# Patient Record
Sex: Male | Born: 1955 | Race: White | Hispanic: No | Marital: Married | Smoking: Never smoker
Health system: Southern US, Community
[De-identification: ages and names within clinical notes are randomized; demographics above are authoritative.]

## PROBLEM LIST (undated history)

## (undated) DIAGNOSIS — Z8673 Personal history of transient ischemic attack (TIA), and cerebral infarction without residual deficits: Secondary | ICD-10-CM

## (undated) DIAGNOSIS — N186 End stage renal disease: Secondary | ICD-10-CM

## (undated) DIAGNOSIS — F419 Anxiety disorder, unspecified: Secondary | ICD-10-CM

## (undated) DIAGNOSIS — I739 Peripheral vascular disease, unspecified: Secondary | ICD-10-CM

## (undated) DIAGNOSIS — C641 Malignant neoplasm of right kidney, except renal pelvis: Secondary | ICD-10-CM

## (undated) DIAGNOSIS — D638 Anemia in other chronic diseases classified elsewhere: Secondary | ICD-10-CM

## (undated) DIAGNOSIS — I10 Essential (primary) hypertension: Secondary | ICD-10-CM

## (undated) DIAGNOSIS — Z8711 Personal history of peptic ulcer disease: Secondary | ICD-10-CM

## (undated) DIAGNOSIS — I2510 Atherosclerotic heart disease of native coronary artery without angina pectoris: Secondary | ICD-10-CM

## (undated) DIAGNOSIS — N289 Disorder of kidney and ureter, unspecified: Secondary | ICD-10-CM

## (undated) DIAGNOSIS — E119 Type 2 diabetes mellitus without complications: Secondary | ICD-10-CM

## (undated) DIAGNOSIS — F32A Depression, unspecified: Secondary | ICD-10-CM

## (undated) HISTORY — PX: EYE SURGERY: SHX253

## (undated) HISTORY — PX: PENILE PROSTHESIS IMPLANT: SHX240

---

## 2005-03-30 ENCOUNTER — Encounter (INDEPENDENT_AMBULATORY_CARE_PROVIDER_SITE_OTHER): Payer: Self-pay | Admitting: *Deleted

## 2005-03-30 ENCOUNTER — Observation Stay (HOSPITAL_COMMUNITY): Admission: RE | Admit: 2005-03-30 | Discharge: 2005-03-31 | Payer: Self-pay | Admitting: Urology

## 2007-04-30 ENCOUNTER — Encounter: Admission: RE | Admit: 2007-04-30 | Discharge: 2007-04-30 | Payer: Self-pay | Admitting: Specialist

## 2009-10-29 ENCOUNTER — Inpatient Hospital Stay (HOSPITAL_COMMUNITY): Admission: AD | Admit: 2009-10-29 | Discharge: 2009-11-01 | Payer: Self-pay | Admitting: Surgery

## 2009-11-29 ENCOUNTER — Ambulatory Visit (HOSPITAL_COMMUNITY): Admission: RE | Admit: 2009-11-29 | Discharge: 2009-11-30 | Payer: Self-pay | Admitting: Ophthalmology

## 2011-01-22 LAB — CBC
HCT: 31.4 % — ABNORMAL LOW (ref 39.0–52.0)
Hemoglobin: 11.1 g/dL — ABNORMAL LOW (ref 13.0–17.0)
MCHC: 35.5 g/dL (ref 30.0–36.0)
MCV: 89.7 fL (ref 78.0–100.0)
RBC: 3.5 MIL/uL — ABNORMAL LOW (ref 4.22–5.81)

## 2011-01-22 LAB — GLUCOSE, CAPILLARY
Glucose-Capillary: 113 mg/dL — ABNORMAL HIGH (ref 70–99)
Glucose-Capillary: 206 mg/dL — ABNORMAL HIGH (ref 70–99)

## 2011-01-22 LAB — BASIC METABOLIC PANEL
CO2: 24 mEq/L (ref 19–32)
Chloride: 106 mEq/L (ref 96–112)
Creatinine, Ser: 1.79 mg/dL — ABNORMAL HIGH (ref 0.4–1.5)
GFR calc Af Amer: 48 mL/min — ABNORMAL LOW (ref >60)
Sodium: 136 mEq/L (ref 135–145)

## 2011-01-22 LAB — POCT I-STAT 4, (NA,K, GLUC, HGB,HCT): Hemoglobin: 11.2 g/dL — ABNORMAL LOW (ref 13.0–17.0)

## 2011-01-30 ENCOUNTER — Ambulatory Visit (HOSPITAL_COMMUNITY): Payer: BC Managed Care – PPO

## 2011-01-30 ENCOUNTER — Ambulatory Visit (HOSPITAL_COMMUNITY)
Admission: RE | Admit: 2011-01-30 | Discharge: 2011-01-30 | Disposition: A | Discharge status: Home / Self Care | Payer: BC Managed Care – PPO | Source: Ambulatory Visit | Attending: Orthopaedic Surgery | Admitting: Orthopaedic Surgery

## 2011-01-30 ENCOUNTER — Other Ambulatory Visit: Payer: Self-pay | Admitting: Orthopaedic Surgery

## 2011-01-30 DIAGNOSIS — L97509 Non-pressure chronic ulcer of other part of unspecified foot with unspecified severity: Secondary | ICD-10-CM | POA: Insufficient documentation

## 2011-01-30 DIAGNOSIS — E1169 Type 2 diabetes mellitus with other specified complication: Secondary | ICD-10-CM | POA: Insufficient documentation

## 2011-01-30 DIAGNOSIS — Z01818 Encounter for other preprocedural examination: Secondary | ICD-10-CM | POA: Insufficient documentation

## 2011-01-30 DIAGNOSIS — Z8673 Personal history of transient ischemic attack (TIA), and cerebral infarction without residual deficits: Secondary | ICD-10-CM | POA: Insufficient documentation

## 2011-01-30 DIAGNOSIS — Z01812 Encounter for preprocedural laboratory examination: Secondary | ICD-10-CM | POA: Insufficient documentation

## 2011-01-30 DIAGNOSIS — I10 Essential (primary) hypertension: Secondary | ICD-10-CM | POA: Insufficient documentation

## 2011-01-30 DIAGNOSIS — M86179 Other acute osteomyelitis, unspecified ankle and foot: Secondary | ICD-10-CM | POA: Insufficient documentation

## 2011-01-30 DIAGNOSIS — M9080 Osteopathy in diseases classified elsewhere, unspecified site: Secondary | ICD-10-CM | POA: Insufficient documentation

## 2011-01-30 DIAGNOSIS — Z01810 Encounter for preprocedural cardiovascular examination: Secondary | ICD-10-CM | POA: Insufficient documentation

## 2011-01-30 LAB — CBC
Hemoglobin: 10.9 g/dL — ABNORMAL LOW (ref 13.0–17.0)
MCH: 28.4 pg (ref 26.0–34.0)
MCV: 82 fL (ref 78.0–100.0)
RBC: 3.84 MIL/uL — ABNORMAL LOW (ref 4.22–5.81)
WBC: 11.9 10*3/uL — ABNORMAL HIGH (ref 4.0–10.5)

## 2011-01-30 LAB — BASIC METABOLIC PANEL
BUN: 38 mg/dL — ABNORMAL HIGH (ref 6–23)
CO2: 22 mEq/L (ref 19–32)
Chloride: 106 mEq/L (ref 96–112)
Creatinine, Ser: 2.44 mg/dL — ABNORMAL HIGH (ref 0.4–1.5)
GFR calc Af Amer: 34 mL/min — ABNORMAL LOW (ref >60)
Potassium: 5 mEq/L (ref 3.5–5.1)

## 2011-01-30 LAB — SURGICAL PCR SCREEN: MRSA, PCR: NEGATIVE

## 2011-01-30 LAB — GLUCOSE, CAPILLARY: Glucose-Capillary: 140 mg/dL — ABNORMAL HIGH (ref 70–99)

## 2011-02-03 NOTE — H&P (Signed)
  [name], [name] NO.:  [id number]  MEDICAL RECORD NO.:  [id number]           PATIENT TYPE:  O  LOCATION:  SDSC                         FACILITY:  [facility]  PHYSICIAN:  Lind Guest. Ninfa Linden, M.D.DATE OF BIRTH:  07-03-1956  DATE OF ADMISSION:  01/30/2011 DATE OF DISCHARGE:  01/30/2011                             HISTORY & PHYSICAL   CHIEF COMPLAINT:  Chronic nonhealing wound, right foot fifth toe.  HISTORY OF PRESENT ILLNESS:  Mr. Nealy is a very pleasant 55 year old gentleman who is my brother's father-in-law.  He is a longtime diabetic and has severe peripheral neuropathy.  He developed a blister on is right fifth toe back in January.  This has slowly worsened with time in spite of antibiotics and debridements.  I saw him in the office yesterday and assessed the wound.  There was some gross purulence that I could express from this.  The x-rays in my office showed complete lossof the DIP joint suggesting osteomyelitis as well.  I recommended, he undergo a fifth toe amputation.  X-rays also showed several calcification of vessels in his foot.  He does wish to proceed with surgery given the chronic nature of this wound and the site is not healing as well as the fact that he is a diabetic.  He has had swelling intermittently in his foot as well.  PAST MEDICAL HISTORY:  Diabetes, high blood pressure, kidney disease, and anxiety.  CURRENT MEDICATIONS:  Lantus insulin, lisinopril, Cipro, and Onglyza.  ALLERGIES:  No known drug allergies.  FAMILY MEDICAL HISTORY:  Significant for diabetes and high blood pressure.  SOCIAL HISTORY:  He is married.  He works as a Freight forwarder.  He is on the seat quite a bit.  He does not smoke and does drink on occasion.  REVIEW OF SYSTEMS:  He denies any chest pain, shortness of breath, fever, chills, nausea, and vomiting.  PHYSICAL EXAMINATION:  VITAL SIGNS:  He is afebrile with stable vital signs. GENERAL:  He is alert and  oriented x3, in no acute distress or obvious discomfort. HEENT:  Normocephalic, atraumatic.  Pupils equal, round, and reactive to light.  Extraocular muscles intact. NECK:  Supple. LUNGS:  Clear to auscultation bilaterally. HEART:  Regular rate and rhythm. ABDOMEN:  Benign. EXTREMITIES:  Right foot shows swelling with the wound on the dorsolateral aspect of the fifth toe.  There is purulent drainage from this that is associated with edema as well.  X-rays confirm suspicion of septic arthritis and osteomyelitis of the fifth toe on the right foot.  ASSESSMENT:  This is a 55 year old diabetic with significant infection involving his fifth toe on his right foot.  PLAN:  We will proceed with an amputation of the toe today.  He understands the risks and benefits of this and does wish to proceed with surgery.     Lind Guest. Ninfa Linden, M.D.     CYB/MEDQ  D:  01/30/2011  T:  01/31/2011  Job:  [id number]  Electronically Signed by Jean Rosenthal M.D. on 02/03/2011 04:08:29 PM

## 2011-02-03 NOTE — Op Note (Signed)
  Maxwell Harmon, Maxwell Harmon NO.:  [id number]  MEDICAL RECORD NO.:  [id number]           PATIENT TYPE:  O  LOCATION:  SDSC                         FACILITY:  [facility]  PHYSICIAN:  Lind Guest. 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  PROCEDURE:  Right fifth toe amputation down to the MTP joint.  SURGEON:  Lind Guest. Ninfa Linden, MD  ANESTHESIA: 1. Regional right ankle block. 2. Mask ventilation IV sedation.  ESTIMATED BLOOD LOSS:  Minimal.  COMPLICATIONS:  None.  INDICATIONS:  [name] is a 55 year old diabetic with the wound that he developed on his right fifth toe around January.  He has severe peripheral neuropathy.  The wound persisted in spite of local debridement and antibiotics became deep infection.  This caused some swelling of his foot.  X-rays were obtained.  This showed complete destruction of the DIP joint and obvious osteomyelitis.  I recommended he undergo a fifth toe amputation.  The risks and benefits were explained to him in detail and he did wish to proceed with surgery.  PROCEDURE DESCRIPTION:  After informed consent was obtained, appropriate right foot was marked.  Anesthesia obtained with an ankle block.  He was then brought to the operating room, placed supine on the operating table.  A bump was placed under his hip.  A right foot and ankle were prepped and draped with DuraPrep and sterile drapes.  A time-out was called to identify the correct patient and the correct right foot.  I then used a towel around the ankle and an Esmarch as a local tourniquet. I was able to excise around the  fifth toe with #10 blade and removed the fifth toe to level the MTP joint.  I did take a look at the toe on the back table and even the proximal phalanx had necrotic bone.  The metatarsal head of the fifth ray was intact and that bone did not appears soft at all.  I removed other necrotic tissue, and let down the ankle tourniquet, there was actually good bleeding tissue in this area. I then copiously irrigated the wound.  I reapproximated the skin flap with interrupted 2-0 nylon suture.  Xeroform followed well-passed sterile dressing is applied, and the patient was taken to the recovery room in stable condition. Postoperatively, I will allow him to weight bear as tolerated in a postoperative shoe, but he should try to stay off as much as possible to elevation.  A followup appointment should be established in the office.     Lind Guest. Ninfa Linden, M.D.     CYB/MEDQ  D:  01/30/2011  T:  01/31/2011  Job:  [id number]  Electronically Signed by Jean Rosenthal M.D. on 02/03/2011 04:08:31 PM

## 2011-02-05 LAB — GLUCOSE, CAPILLARY
Glucose-Capillary: 134 mg/dL — ABNORMAL HIGH (ref 70–99)
Glucose-Capillary: 147 mg/dL — ABNORMAL HIGH (ref 70–99)
Glucose-Capillary: 176 mg/dL — ABNORMAL HIGH (ref 70–99)
Glucose-Capillary: 177 mg/dL — ABNORMAL HIGH (ref 70–99)
Glucose-Capillary: 180 mg/dL — ABNORMAL HIGH (ref 70–99)
Glucose-Capillary: 182 mg/dL — ABNORMAL HIGH (ref 70–99)
Glucose-Capillary: 186 mg/dL — ABNORMAL HIGH (ref 70–99)
Glucose-Capillary: 199 mg/dL — ABNORMAL HIGH (ref 70–99)
Glucose-Capillary: 217 mg/dL — ABNORMAL HIGH (ref 70–99)
Glucose-Capillary: 228 mg/dL — ABNORMAL HIGH (ref 70–99)

## 2011-02-05 LAB — PROTIME-INR
INR: 1.19 (ref 0.00–1.49)
Prothrombin Time: 15 seconds (ref 11.6–15.2)

## 2011-02-05 LAB — CBC
HCT: 25.3 % — ABNORMAL LOW (ref 39.0–52.0)
Hemoglobin: 7.2 g/dL — ABNORMAL LOW (ref 13.0–17.0)
Hemoglobin: 9.3 g/dL — ABNORMAL LOW (ref 13.0–17.0)
MCHC: 34.2 g/dL (ref 30.0–36.0)
MCHC: 34.6 g/dL (ref 30.0–36.0)
MCV: 89 fL (ref 78.0–100.0)
MCV: 89.7 fL (ref 78.0–100.0)
MCV: 89.7 fL (ref 78.0–100.0)
Platelets: 144 10*3/uL — ABNORMAL LOW (ref 150–400)
Platelets: 145 10*3/uL — ABNORMAL LOW (ref 150–400)
Platelets: 155 10*3/uL (ref 150–400)
Platelets: 161 10*3/uL (ref 150–400)
RBC: 2.38 MIL/uL — ABNORMAL LOW (ref 4.22–5.81)
RBC: 2.82 MIL/uL — ABNORMAL LOW (ref 4.22–5.81)
RDW: 13.2 % (ref 11.5–15.5)
RDW: 13.5 % (ref 11.5–15.5)
WBC: 6.8 10*3/uL (ref 4.0–10.5)
WBC: 6.8 10*3/uL (ref 4.0–10.5)
WBC: 7.7 10*3/uL (ref 4.0–10.5)

## 2011-02-05 LAB — CROSSMATCH: ABO/RH(D): O NEG

## 2011-02-05 LAB — COMPREHENSIVE METABOLIC PANEL
CO2: 18 mEq/L — ABNORMAL LOW (ref 19–32)
Calcium: 8.5 mg/dL (ref 8.4–10.5)
Creatinine, Ser: 1.9 mg/dL — ABNORMAL HIGH (ref 0.4–1.5)
GFR calc Af Amer: 45 mL/min — ABNORMAL LOW (ref >60)
GFR calc non Af Amer: 37 mL/min — ABNORMAL LOW (ref >60)
Glucose, Bld: 170 mg/dL — ABNORMAL HIGH (ref 70–99)

## 2011-02-05 LAB — BASIC METABOLIC PANEL
BUN: 25 mg/dL — ABNORMAL HIGH (ref 6–23)
Calcium: 8.5 mg/dL (ref 8.4–10.5)
GFR calc non Af Amer: 40 mL/min — ABNORMAL LOW (ref >60)
Glucose, Bld: 205 mg/dL — ABNORMAL HIGH (ref 70–99)

## 2011-02-05 LAB — APTT: aPTT: 28 seconds (ref 24–37)

## 2011-02-05 LAB — ABO/RH: ABO/RH(D): O NEG

## 2011-03-23 NOTE — Op Note (Signed)
NAMEKIRILL, BETIT               ACCOUNT NO.:  [id number]   MEDICAL RECORD NO.:  [id number]          PATIENT TYPE:  AMB   LOCATION:  DAY                          FACILITY:  [facility]   PHYSICIAN:  Domingo Pulse, M.D.  DATE OF BIRTH:  05/03/1956   DATE OF PROCEDURE:  03/30/2005  DATE OF DISCHARGE:                                 OPERATIVE REPORT   PREOPERATIVE DIAGNOSES:  1.  Malfunctioning penile prosthesis.  2.  Undesired fertility.   POSTOPERATIVE DIAGNOSES:  1.  Malfunctioning penile prosthesis.  2.  Undesired fertility.   PROCEDURES:  1.  Removal of two-piece AMs penile prosthesis.  2.  Placement of three-piece AMs penile prosthesis.  3.  Bilateral vasectomy.   SURGEON:  Domingo Pulse, M.D.   ASSISTANT:  None.   ANESTHESIA:  General.   SPECIMEN:  Two vasa.   COMPLICATIONS:  None.   DRAINS:  A  Foley catheter and a J-P drain.   HISTORY:  This 55 year old male had a penile prosthesis placed approximately  nine years ago.  This was done in [place], [place], for the treatment  of severe erectile dysfunction caused by diabetes.  The patient had had good  results from this for a number of years but recently has developed a leak.  The device is no longer functional.  He would like to have a new penile  prosthesis placed.  He would be interested in having a larger erection and a  longer erection if possible.  His evaluation demonstrated that the device  was a little short, and it was thought that he could perhaps tolerate a  longer prosthesis. For that reason, we anticipate that he will be sized up.  After looking at the options, he has elected to go with a three-piece device  which should give a better quality erection.  He understands the risks and  benefits of the procedure including the potential for infection given the  fact that he is diabetic, and this is a revision.  Full informed consent was  obtained.   PROCEDURE:  After successful induction of general  anesthesia, the patient  was placed in the supine position.  He was given a full 10-minute scrub and  paint with Betadine.  He was then draped including a Vi-Drape to protect the  operative field.  The patient had had this operation done through a vertical  penoscrotal incision, and that was re-utilized.  Once the urethra had been  exposed, the Wilson ring was placed in the usual fashion giving good  exposure to the corporal bodies.  Dissection proceeded posteriorly, and it  became clear that the tubing had exited the corpora at a very, very  dependant position.  It was felt this was not optimal for good placement of  the device and for closure.  For that reason, a decision was made to cut the  tubing to the penile tube directly adjacent to the corporal body and then  pull out the device through a separate corporotomy in a slightly more distal  position. The leak in the tubing was found to be bilateral  and directly  adjacent to the pump where the device has broken down.  The pump was  dissected away from the tissue and was mobilized.  The tubing down to the  penile tubes was freed up all the way down to the corporal bodies.  Both  tubes were then cut flush with the corporal body.  Separate corporotomies  were made, and the entire device was removed including the rear-tip  extenders.  The [employer] company was contacted to determine the exact length of  the prosthesis that had been in place, and it was an 18 cm with two 0.5-cm  rear-tip giving a total length of 19.  The entire device was successfully  removed.  The patient was once again redilated and was easy to dilate up to  #14 both proximally and distally.  The patient was checked to make sure  there was no perforation and no crossover, and it appeared that the dilation  was appropriate.  Stay sutures were placed in the corporotomy.  The area was  sized, and it appeared that he would take approximately a 21-cm device,  possibly 21.5,  indicating significant improvement over what had been  available previously.  The 18-cm device was selected with the plan to use  several rear-tip extenders.  The reservoir space was then prepared.  The  ring was identified, and the posterior aspect of the ring was opened  allowing for placement of the reservoir into the retroperitoneal space.  80  cm was placed within the device.  There still was no back pressure.  At the  end of the procedure, this was pulled back to 60 cm.  The penile tubes were  then placed in the usual fashion with the Furlough tool.  At first, the 21  cm was selected.  It became clear that he could tolerate a little bit more,  and an additional 1-cm rear-tip was placed for a total length of 22 cm.  This gave better filling out to the head of the penis, but there was no  buckling inside the corporotomy, and it appears that this was very nicely  seated.  Prior to placement of the device, the patient had what has been  referred to as a mini-salvage.  The area was irrigated with antibiotic  solution, half-strength Betadine, half-strength peroxide, followed by  another round of antibiotic solution.  This has been described by several  experts in the field including Dr. Romie Minus at [facility] as well as  by Dr. Rogelia Boga who recommend that on revisions this be utilized to cut  down on any biofilm that may have accumulated.  The irrigation was performed  both within the current operative field as well as inside the two  corporotomies.  When the device was placed, the area was cycled, and it did  appear that it was very nicely situated.  The corporotomy was then closed  with the preplaced sutures.  The input tubing protective sheath was cut back  to the level of the exit.  An additional space was made for the pump which  was placed into a dependent position.  The connection to the reservoir was made after ensuring that the total reservoir volume was only 60 cc.   The  patient wanted to have a vasectomy.  The vasa were identified on each side.  They was dissected away from the surrounding tissue with scissors. The vasa  were cut, cauterized, ligated, and separated.  The patient had a small drain  placed, and this was brought out through a separate exit wound.  He was then  closed in several layers with 2-0 Vicryl, followed by 3-0 Vicryl in the  skin.  A Tegaderm was placed.  A pressure dressing was applied.  The Foley  catheter was left in place.  The patient tolerated the procedure well and  was taken to the recovery room in good condition.     _______________    [id number]  D:  03/30/2005  T:  03/30/2005  Job:  [id number]

## 2011-11-06 HISTORY — PX: TOE AMPUTATION: SHX809

## 2013-01-26 ENCOUNTER — Other Ambulatory Visit (INDEPENDENT_AMBULATORY_CARE_PROVIDER_SITE_OTHER): Payer: Self-pay | Admitting: Surgery

## 2013-01-26 NOTE — H&P (Signed)
Maxwell Harmon, Maxwell Harmon NO.:  [id number]  MEDICAL RECORD NO.:  [id number]  LOCATION:                                 FACILITY:  PHYSICIAN:  Coralie Keens, M.D. DATE OF BIRTH:  1956/01/10  DATE OF ADMISSION:  01/29/2013 DATE OF DISCHARGE:  01/29/2013                             HISTORY & PHYSICAL   CHIEF COMPLAINT:  Right upper quadrant abdominal pain, epigastric pain, and nausea.  HISTORY:  This is a 57 year old gentleman who has had a several-month history of epigastric abdominal pain, right upper quadrant abdominal pain, and bloating after fatty meals.  He also has nausea, but no emesis.  This has been intermittent.  He will also have loose bowel movements with this.  The pain is mild-to-moderate in intensity.  He currently has no other complaints.  PAST MEDICAL HISTORY:  Diabetes, hypertension, chronic renal failure, upper GI bleed.  MEDICATIONS:  Insulin, lisinopril.  ALLERGIES:  No known drug allergies.  PAST SURGICAL HISTORY:  Amputation of the right 5th toe.  SOCIAL HISTORY:  He does not smoke.  He drinks alcohol rarely.  FAMILY HISTORY:  Significant for diabetes and hypertension.  REVIEW OF SYSTEMS:  Negative for chest pain, fever, shortness of breath.  PHYSICAL EXAMINATION:  GENERAL:  He is a well-developed, well-nourished gentleman in no acute distress. VITAL SIGNS:  He is afebrile.  Vital signs stable. LUNGS:  Clear to auscultation bilaterally.  Normal respiratory effort. CARDIOVASCULAR:  Regular rate and rhythm.  There are no murmurs.  There is no peripheral edema. ABDOMEN:  Soft.  There is minimal tenderness in the epigastrium and right upper quadrant.  There are no hernias. EXTREMITIES:  Warm with good cap refill.  DATA REVIEWED:  The patient has an ultrasound of the abdomen showing thick sludge and small gallstones.  There is no gallbladder wall thickening.  The bile duct is normal.  IMPRESSION:  Symptomatic  cholelithiasis.  PLAN:  We will proceed with laparoscopic cholecystectomy and possible cholangiogram.  I discussed risks of surgery with him.  These include, but are not limited to bleeding, infection, bile duct injury, bile leak, need to convert to an open procedure, the chance this may not resolve all his symptoms, etc.  He understands and wishes to proceed. Likelihood of success is good.     Coralie Keens, M.D.     DB/MEDQ  D:  01/26/2013  T:  01/26/2013  Job:  [id number]

## 2013-02-03 HISTORY — PX: CHOLECYSTECTOMY: SHX55

## 2013-02-06 ENCOUNTER — Other Ambulatory Visit (INDEPENDENT_AMBULATORY_CARE_PROVIDER_SITE_OTHER): Payer: Self-pay | Admitting: Surgery

## 2013-02-06 DIAGNOSIS — K8010 Calculus of gallbladder with chronic cholecystitis without obstruction: Secondary | ICD-10-CM

## 2013-02-09 ENCOUNTER — Telehealth (INDEPENDENT_AMBULATORY_CARE_PROVIDER_SITE_OTHER): Payer: Self-pay | Admitting: General Surgery

## 2013-02-09 NOTE — Telephone Encounter (Signed)
Spoke with patient he is aware of po f/u visit on 03/02/13

## 2013-03-02 ENCOUNTER — Encounter (INDEPENDENT_AMBULATORY_CARE_PROVIDER_SITE_OTHER): Payer: BC Managed Care – PPO | Admitting: Surgery

## 2013-12-02 ENCOUNTER — Encounter (HOSPITAL_COMMUNITY): Payer: Self-pay | Admitting: Pharmacy Technician

## 2013-12-04 NOTE — Patient Instructions (Addendum)
LEJEND DICECCO  12/07/2013   Your procedure is scheduled on:  12/14/13  Report to [facility] at 09:00 AM.  Call this number if you have problems the morning of surgery: [phone]   Remember:   Do not eat food or drink liquids after midnight.   Take these medicines the morning of surgery with A SIP OF WATER: Metoprolol and Losartan-HCTZ. You may take your Clonazepam if needed. Take only half of you dose of insulin the night before surgery = 10 units.   Do not wear jewelry, make-up or nail polish.  Do not wear lotions, powders, or perfumes. You may wear deodorant.  Do not shave 48 hours prior to surgery. Men may shave face and neck.  Do not bring valuables to the hospital.  [facility] is not responsible for any belongings or valuables.               Contacts, dentures or bridgework may not be worn into surgery.  Leave suitcase in the car. After surgery it may be brought to your room.  For patients admitted to the hospital, discharge time is determined by your treatment team.               Patients discharged the day of surgery will not be allowed to drive home.   Special Instructions: Start using your eye drops before surgery as directed by your eye doctor.   Please read over the following fact sheets that you were given: Pain Booklet, Anesthesia Post-op Instructions and Care and Recovery After Surgery   Cataract Surgery  A cataract is a clouding of the lens of the eye. When a lens becomes cloudy, vision is reduced based on the degree and nature of the clouding. Surgery may be needed to improve vision. Surgery removes the cloudy lens and usually replaces it with a substitute lens (intraocular lens, IOL). LET YOUR EYE DOCTOR KNOW ABOUT:  Allergies to food or medicine.  Medicines taken including herbs, eyedrops, over-the-counter medicines, and creams.  Use of steroids (by mouth or creams).  Previous problems with anesthetics or numbing medicine.  History of bleeding problems or  blood clots.  Previous surgery.  Other health problems, including diabetes and kidney problems.  Possibility of pregnancy, if this applies. RISKS AND COMPLICATIONS  Infection.  Inflammation of the eyeball (endophthalmitis) that can spread to both eyes (sympathetic ophthalmia).  Poor wound healing.  If an IOL is inserted, it can later fall out of proper position. This is very uncommon.  Clouding of the part of your eye that holds an IOL in place. This is called an "after-cataract." These are uncommon, but easily treated. BEFORE THE PROCEDURE  Do not eat or drink anything except small amounts of water for 8 to 12 before your surgery, or as directed by your caregiver.  Unless you are told otherwise, continue any eyedrops you have been prescribed.  Talk to your primary caregiver about all other medicines that you take (both prescription and non-prescription). In some cases, you may need to stop or change medicines near the time of your surgery. This is most important if you are taking blood-thinning medicine.Do not stop medicines unless you are told to do so.  Arrange for someone to drive you to and from the procedure.  Do not put contact lenses in either eye on the day of your surgery. PROCEDURE There is more than one method for safely removing a cataract. Your doctor can explain the differences and help determine which is best  for you. Phacoemulsification surgery is the most common form of cataract surgery.  An injection is given behind the eye or eyedrops are given to make this a painless procedure.  A small cut (incision) is made on the edge of the clear, dome-shaped surface that covers the front of the eye (cornea).  A tiny probe is painlessly inserted into the eye. This device gives off ultrasound waves that soften and break up the cloudy center of the lens. This makes it easier for the cloudy lens to be removed by suction.  An IOL may be implanted.  The normal lens of  the eye is covered by a clear capsule. Part of that capsule is intentionally left in the eye to support the IOL.  Your surgeon may or may not use stitches to close the incision. There are other forms of cataract surgery that require a larger incision and stiches to close the eye. This approach is taken in cases where the doctor feels that the cataract cannot be easily removed using phacoemulsification. AFTER THE PROCEDURE  When an IOL is implanted, it does not need care. It becomes a permanent part of your eye and cannot be seen or felt.  Your doctor will schedule follow-up exams to check on your progress.  Review your other medicines with your doctor to see which can be resumed after surgery.  Use eyedrops or take medicine as prescribed by your doctor. Document Released: 10/11/2011 Document Revised: 01/14/2012 Document Reviewed: 10/11/2011 [facility] Patient Information 2014 [place], [place].    PATIENT INSTRUCTIONS POST-ANESTHESIA  IMMEDIATELY FOLLOWING SURGERY:  Do not drive or operate machinery for the first twenty four hours after surgery.  Do not make any important decisions for twenty four hours after surgery or while taking narcotic pain medications or sedatives.  If you develop intractable nausea and vomiting or a severe headache please notify your doctor immediately.  FOLLOW-UP:  Please make an appointment with your surgeon as instructed. You do not need to follow up with anesthesia unless specifically instructed to do so.  WOUND CARE INSTRUCTIONS (if applicable):  Keep a dry clean dressing on the anesthesia/puncture wound site if there is drainage.  Once the wound has quit draining you may leave it open to air.  Generally you should leave the bandage intact for twenty four hours unless there is drainage.  If the epidural site drains for more than 36-48 hours please call the anesthesia department.  QUESTIONS?:  Please feel free to call your physician or the hospital operator if you  have any questions, and they will be happy to assist you.

## 2013-12-07 ENCOUNTER — Encounter (HOSPITAL_COMMUNITY): Payer: Self-pay

## 2013-12-07 ENCOUNTER — Encounter (HOSPITAL_COMMUNITY)
Admission: RE | Admit: 2013-12-07 | Discharge: 2013-12-07 | Disposition: A | Discharge status: Home / Self Care | Payer: BC Managed Care – PPO | Source: Ambulatory Visit | Attending: Ophthalmology | Admitting: Ophthalmology

## 2013-12-07 ENCOUNTER — Other Ambulatory Visit: Payer: Self-pay

## 2013-12-07 DIAGNOSIS — Z01812 Encounter for preprocedural laboratory examination: Secondary | ICD-10-CM | POA: Insufficient documentation

## 2013-12-07 DIAGNOSIS — Z01810 Encounter for preprocedural cardiovascular examination: Secondary | ICD-10-CM | POA: Insufficient documentation

## 2013-12-07 HISTORY — DX: Personal history of transient ischemic attack (TIA), and cerebral infarction without residual deficits: Z86.73

## 2013-12-07 HISTORY — DX: Disorder of kidney and ureter, unspecified: N28.9

## 2013-12-07 HISTORY — DX: Personal history of peptic ulcer disease: Z87.11

## 2013-12-07 HISTORY — DX: Essential (primary) hypertension: I10

## 2013-12-07 HISTORY — DX: Type 2 diabetes mellitus without complications: E11.9

## 2013-12-07 LAB — BASIC METABOLIC PANEL
BUN: 40 mg/dL — ABNORMAL HIGH (ref 6–23)
CO2: 26 mEq/L (ref 19–32)
CREATININE: 2.14 mg/dL — AB (ref 0.50–1.35)
Calcium: 9.3 mg/dL (ref 8.4–10.5)
Chloride: 104 mEq/L (ref 96–112)
GFR calc non Af Amer: 33 mL/min — ABNORMAL LOW (ref >90)
GFR, EST AFRICAN AMERICAN: 38 mL/min — AB (ref >90)
Glucose, Bld: 200 mg/dL — ABNORMAL HIGH (ref 70–99)
Potassium: 5.2 mEq/L (ref 3.7–5.3)
Sodium: 141 mEq/L (ref 137–147)

## 2013-12-07 LAB — HEMOGLOBIN AND HEMATOCRIT, BLOOD
HEMATOCRIT: 35.2 % — AB (ref 39.0–52.0)
HEMOGLOBIN: 12.1 g/dL — AB (ref 13.0–17.0)

## 2013-12-11 MED ORDER — CYCLOPENTOLATE-PHENYLEPHRINE OP SOLN OPTIME - NO CHARGE
OPHTHALMIC | Status: AC
  Filled 2013-12-11: qty 2

## 2013-12-11 MED ORDER — PHENYLEPHRINE HCL 2.5 % OP SOLN
OPHTHALMIC | Status: AC
  Filled 2013-12-11: qty 15

## 2013-12-11 MED ORDER — LIDOCAINE HCL (PF) 1 % IJ SOLN
INTRAMUSCULAR | Status: AC
  Filled 2013-12-11: qty 2

## 2013-12-11 MED ORDER — TETRACAINE HCL 0.5 % OP SOLN
OPHTHALMIC | Status: AC
  Filled 2013-12-11: qty 2

## 2013-12-11 MED ORDER — NEOMYCIN-POLYMYXIN-DEXAMETH 3.5-10000-0.1 OP SUSP
OPHTHALMIC | Status: AC
  Filled 2013-12-11: qty 5

## 2013-12-11 MED ORDER — LIDOCAINE HCL 3.5 % OP GEL
OPHTHALMIC | Status: AC
  Filled 2013-12-11: qty 1

## 2013-12-14 ENCOUNTER — Encounter (HOSPITAL_COMMUNITY): Payer: Self-pay | Admitting: *Deleted

## 2013-12-14 ENCOUNTER — Encounter (HOSPITAL_COMMUNITY): Payer: BC Managed Care – PPO | Admitting: Anesthesiology

## 2013-12-14 ENCOUNTER — Ambulatory Visit (HOSPITAL_COMMUNITY): Payer: BC Managed Care – PPO | Admitting: Anesthesiology

## 2013-12-14 ENCOUNTER — Ambulatory Visit (HOSPITAL_COMMUNITY)
Admission: RE | Admit: 2013-12-14 | Discharge: 2013-12-14 | Disposition: A | Discharge status: Home / Self Care | Payer: BC Managed Care – PPO | Source: Ambulatory Visit | Attending: Ophthalmology | Admitting: Ophthalmology

## 2013-12-14 ENCOUNTER — Encounter (HOSPITAL_COMMUNITY)
Admission: RE | Disposition: A | Discharge status: Home / Self Care | Payer: Self-pay | Source: Ambulatory Visit | Attending: Ophthalmology

## 2013-12-14 DIAGNOSIS — I10 Essential (primary) hypertension: Secondary | ICD-10-CM | POA: Insufficient documentation

## 2013-12-14 DIAGNOSIS — E119 Type 2 diabetes mellitus without complications: Secondary | ICD-10-CM | POA: Insufficient documentation

## 2013-12-14 DIAGNOSIS — H2589 Other age-related cataract: Secondary | ICD-10-CM | POA: Insufficient documentation

## 2013-12-14 DIAGNOSIS — Z79899 Other long term (current) drug therapy: Secondary | ICD-10-CM | POA: Insufficient documentation

## 2013-12-14 DIAGNOSIS — Z7982 Long term (current) use of aspirin: Secondary | ICD-10-CM | POA: Insufficient documentation

## 2013-12-14 DIAGNOSIS — Z794 Long term (current) use of insulin: Secondary | ICD-10-CM | POA: Insufficient documentation

## 2013-12-14 HISTORY — PX: CATARACT EXTRACTION W/PHACO: SHX586

## 2013-12-14 LAB — GLUCOSE, CAPILLARY: Glucose-Capillary: 173 mg/dL — ABNORMAL HIGH (ref 70–99)

## 2013-12-14 SURGERY — PHACOEMULSIFICATION, CATARACT, WITH IOL INSERTION
Anesthesia: Monitor Anesthesia Care | Site: Eye | Laterality: Right

## 2013-12-14 MED ORDER — POVIDONE-IODINE 5 % OP SOLN
OPHTHALMIC | Status: DC | PRN
  Administered 2013-12-14: 1 via OPHTHALMIC

## 2013-12-14 MED ORDER — EPINEPHRINE HCL 1 MG/ML IJ SOLN
INTRAMUSCULAR | Status: AC
  Filled 2013-12-14: qty 1

## 2013-12-14 MED ORDER — EPINEPHRINE HCL 1 MG/ML IJ SOLN
INTRAMUSCULAR | Status: DC | PRN
  Administered 2013-12-14: 11:00:00

## 2013-12-14 MED ORDER — PHENYLEPHRINE HCL 2.5 % OP SOLN
1.0000 [drp] | OPHTHALMIC | Status: AC
  Administered 2013-12-14 (×3): 1 [drp] via OPHTHALMIC

## 2013-12-14 MED ORDER — FENTANYL CITRATE 0.05 MG/ML IJ SOLN: 25.0000 ug | INTRAMUSCULAR | Status: DC | PRN

## 2013-12-14 MED ORDER — FENTANYL CITRATE 0.05 MG/ML IJ SOLN
25.0000 ug | INTRAMUSCULAR | Status: AC
  Administered 2013-12-14 (×2): 25 ug via INTRAVENOUS

## 2013-12-14 MED ORDER — LIDOCAINE 3.5 % OP GEL OPTIME - NO CHARGE
OPHTHALMIC | Status: DC | PRN
  Administered 2013-12-14: 2 [drp] via OPHTHALMIC

## 2013-12-14 MED ORDER — FENTANYL CITRATE 0.05 MG/ML IJ SOLN
INTRAMUSCULAR | Status: AC
  Filled 2013-12-14: qty 2

## 2013-12-14 MED ORDER — LACTATED RINGERS IV SOLN
INTRAVENOUS | Status: DC
  Administered 2013-12-14: 10:00:00 via INTRAVENOUS

## 2013-12-14 MED ORDER — LIDOCAINE HCL 3.5 % OP GEL
1.0000 "application " | Freq: Once | OPHTHALMIC | Status: AC
  Administered 2013-12-14: 1 via OPHTHALMIC

## 2013-12-14 MED ORDER — NEOMYCIN-POLYMYXIN-DEXAMETH 3.5-10000-0.1 OP SUSP
OPHTHALMIC | Status: DC | PRN
  Administered 2013-12-14: 2 [drp] via OPHTHALMIC

## 2013-12-14 MED ORDER — PROVISC 10 MG/ML IO SOLN
INTRAOCULAR | Status: DC | PRN
  Administered 2013-12-14: 0.85 mL via INTRAOCULAR

## 2013-12-14 MED ORDER — TETRACAINE HCL 0.5 % OP SOLN
1.0000 [drp] | OPHTHALMIC | Status: AC
  Administered 2013-12-14 (×3): 1 [drp] via OPHTHALMIC

## 2013-12-14 MED ORDER — CYCLOPENTOLATE-PHENYLEPHRINE 0.2-1 % OP SOLN
1.0000 [drp] | OPHTHALMIC | Status: AC
  Administered 2013-12-14 (×3): 1 [drp] via OPHTHALMIC

## 2013-12-14 MED ORDER — LIDOCAINE HCL (PF) 1 % IJ SOLN
INTRAMUSCULAR | Status: DC | PRN
  Administered 2013-12-14: .5 mL

## 2013-12-14 MED ORDER — MIDAZOLAM HCL 2 MG/2ML IJ SOLN
INTRAMUSCULAR | Status: AC
  Filled 2013-12-14: qty 2

## 2013-12-14 MED ORDER — ONDANSETRON HCL 4 MG/2ML IJ SOLN: 4.0000 mg | Freq: Once | INTRAMUSCULAR | Status: DC | PRN

## 2013-12-14 MED ORDER — MIDAZOLAM HCL 2 MG/2ML IJ SOLN
1.0000 mg | INTRAMUSCULAR | Status: DC | PRN
  Administered 2013-12-14: 2 mg via INTRAVENOUS

## 2013-12-14 MED ORDER — BSS IO SOLN
INTRAOCULAR | Status: DC | PRN
  Administered 2013-12-14: 15 mL via INTRAOCULAR

## 2013-12-14 SURGICAL SUPPLY — 32 items
CAPSULAR TENSION RING-AMO (OPHTHALMIC RELATED) IMPLANT
CLOTH BEACON ORANGE TIMEOUT ST (SAFETY) ×1 IMPLANT
EYE SHIELD UNIVERSAL CLEAR (GAUZE/BANDAGES/DRESSINGS) ×2 IMPLANT
GLOVE BIO SURGEON STRL SZ 6.5 (GLOVE) IMPLANT
GLOVE BIOGEL PI IND STRL 6.5 (GLOVE) IMPLANT
GLOVE BIOGEL PI IND STRL 7.0 (GLOVE) IMPLANT
GLOVE BIOGEL PI IND STRL 7.5 (GLOVE) IMPLANT
GLOVE BIOGEL PI INDICATOR 6.5 (GLOVE) ×1
GLOVE BIOGEL PI INDICATOR 7.0 (GLOVE) ×1
GLOVE BIOGEL PI INDICATOR 7.5 (GLOVE)
GLOVE ECLIPSE 6.5 STRL STRAW (GLOVE) IMPLANT
GLOVE ECLIPSE 7.0 STRL STRAW (GLOVE) IMPLANT
GLOVE ECLIPSE 7.5 STRL STRAW (GLOVE) IMPLANT
GLOVE EXAM NITRILE LRG STRL (GLOVE) IMPLANT
GLOVE EXAM NITRILE MD LF STRL (GLOVE) IMPLANT
GLOVE SKINSENSE NS SZ6.5 (GLOVE)
GLOVE SKINSENSE NS SZ7.0 (GLOVE)
GLOVE SKINSENSE STRL SZ6.5 (GLOVE) IMPLANT
GLOVE SKINSENSE STRL SZ7.0 (GLOVE) IMPLANT
KIT VITRECTOMY (OPHTHALMIC RELATED) IMPLANT
PAD ARMBOARD 7.5X6 YLW CONV (MISCELLANEOUS) ×1 IMPLANT
PROC W NO LENS (INTRAOCULAR LENS)
PROC W SPEC LENS (INTRAOCULAR LENS)
PROCESS W NO LENS (INTRAOCULAR LENS) IMPLANT
PROCESS W SPEC LENS (INTRAOCULAR LENS) IMPLANT
RING MALYGIN (MISCELLANEOUS) IMPLANT
SIGHTPATH CAT PROC W REG LENS (Ophthalmic Related) ×2 IMPLANT
SYR TB 1ML LL NO SAFETY (SYRINGE) ×1 IMPLANT
TAPE SURG TRANSPORE 1 IN (GAUZE/BANDAGES/DRESSINGS) IMPLANT
TAPE SURGICAL TRANSPORE 1 IN (GAUZE/BANDAGES/DRESSINGS) ×1
VISCOELASTIC ADDITIONAL (OPHTHALMIC RELATED) IMPLANT
WATER STERILE IRR 250ML POUR (IV SOLUTION) ×2 IMPLANT

## 2013-12-14 NOTE — Transfer of Care (Signed)
Immediate Anesthesia Transfer of Care Note  Patient: Maxwell Harmon  Procedure(s) Performed: Procedure(s) with comments: CATARACT EXTRACTION PHACO AND INTRAOCULAR LENS PLACEMENT (IOC) (Right) - CDE:  16.30  Patient Location: Short Stay  Anesthesia Type:MAC  Level of Consciousness: awake, alert , oriented and patient cooperative  Airway & Oxygen Therapy: Patient Spontanous Breathing  Post-op Assessment: Report given to PACU RN and Post -op Vital signs reviewed and stable  Post vital signs: Reviewed and stable  Complications: No apparent anesthesia complications

## 2013-12-14 NOTE — Anesthesia Procedure Notes (Signed)
Procedure Name: MAC Date/Time: 12/14/2013 10:39 AM Performed by: Andree Elk, Zayonna Ayuso A Pre-anesthesia Checklist: Patient identified, Timeout performed, Emergency Drugs available, Suction available and Patient being monitored Oxygen Delivery Method: Nasal cannula

## 2013-12-14 NOTE — Preoperative (Signed)
Beta Blockers   Reason not to administer Beta Blockers:Not Applicable 

## 2013-12-14 NOTE — Discharge Instructions (Signed)
General Anesthesia, Adult General anesthesia is a sleep-like state of non-feeling produced by medicines (anesthetics). General anesthesia prevents you from being alert and feeling pain during a medical procedure. Your caregiver may recommend general anesthesia if your procedure:  Is long.  Is painful or uncomfortable.  Would be frightening to see or hear.  Requires you to be still.  Affects your breathing.  Causes significant blood loss. LET YOUR CAREGIVER KNOW ABOUT:  Allergies to food or medicine.  Medicines taken, including vitamins, herbs, eyedrops, over-the-counter medicines, and creams.  Use of steroids (by mouth or creams).  Previous problems with anesthetics or numbing medicines, including problems experienced by relatives.  History of bleeding problems or blood clots.  Previous surgeries and types of anesthetics received.  Possibility of pregnancy, if this applies.  Use of cigarettes, alcohol, or illegal drugs.  Any health condition(s), especially diabetes, sleep apnea, and high blood pressure. RISKS AND COMPLICATIONS General anesthesia rarely causes complications. However, if complications do occur, they can be life-threatening. Complications include:  A lung infection.  A stroke.  A heart attack.  Waking up during the procedure. When this occurs, the patient may be unable to move and communicate that he or she is awake. The patient may feel severe pain. Older adults and adults with serious medical problems are more likely to have complications than adults who are young and healthy. Some complications can be prevented by answering all of your caregiver's questions thoroughly and by following all pre-procedure instructions. It is important to tell your caregiver if any of the pre-procedure instructions, especially those related to diet, were not followed. Any food or liquid in the stomach can cause problems when you are under general anesthesia. BEFORE THE  PROCEDURE  Ask your caregiver if you will have to spend the night at the hospital. If you will not have to spend the night, arrange to have an adult drive you and stay with you for 24-hours.  Follow your caregiver's instructions if you are taking dietary supplements or medicines. Your caregiver may tell you to stop taking them or to reduce your dosage.  Do not smoke for as long as possible before your procedure. If possible, stop smoking 3 6 weeks before the procedure.  Do not take new dietary supplements or medicines within 1 week of your procedure unless your caregiver approves them.  Do not eat within 8 hours of your procedure or as directed by your caregiver. Drink only clear liquids, such as water, black coffee (without milk or cream), and fruit juices (without pulp).  Do not drink within 3 hours of your procedure or as directed by your caregiver.  You may brush your teeth on the morning of the procedure, but make sure to spit out the toothpaste and water when finished. PROCEDURE  You will receive anesthetics through a mask, through an intravenous (IV) access tube, or through both. A doctor who specializes in anesthesia (anesthesiologist) or a nurse who specializes in anesthesia (nurse anesthetist) or both will stay with you throughout the procedure to make sure you remain unconscious. He or she will also watch your blood pressure, pulse, and oxygen levels to make sure that the anesthetics do not cause any problems. Once you are asleep, a breathing tube or mask may be used to help you breathe. AFTER THE PROCEDURE You will wake up after the procedure is complete. You may be in the room where the procedure was performed or in a recovery area. You may have a sore throat if  a breathing tube was used. You may also feel:  Dizzy.  Weak.  Drowsy.  Confused.  Nauseous.  Cold. These are all normal responses and can be expected to last for up to 24 hours after the procedure is complete. A  caregiver will tell you when you are ready to go home. This will usually be when you are fully awake and in stable condition. Document Released: 01/29/2008 Document Revised: 10/08/2012 Document Reviewed: 02/20/2012 [facility] Patient Information 2014 [place], [place].

## 2013-12-14 NOTE — Op Note (Signed)
Date of Admission: 12/14/2013  Date of Surgery: 12/14/2013  Pre-Op Dx: Cataract  Right  Eye  Post-Op Dx: Combined Cataract  Right  Eye,  Dx Code 366.19  Surgeon: Tonny Branch, M.D.  Assistants: None  Anesthesia: Topical with MAC  Indications: Painless, progressive loss of vision with compromise of daily activities.  Surgery: Cataract Extraction with Intraocular lens Implant Right Eye  Discription: The patient had dilating drops and viscous lidocaine placed into the right eye in the pre-op holding area. After transfer to the operating room, a time out was performed. The patient was then prepped and draped. Beginning with a 43 degree blade a paracentesis port was made at the surgeon's 2 o'clock position. The anterior chamber was then filled with 1% non-preserved lidocaine. This was followed by filling the anterior chamber with Provisc.  A 2.59mm keratome blade was used to make a clear corneal incision at the temporal limbus.  A bent cystatome needle was used to create a continuous tear capsulotomy. Hydrodissection was performed with balanced salt solution on a Fine canula. The lens nucleus was then removed using the phacoemulsification handpiece. Residual cortex was removed with the I&A handpiece. The anterior chamber and capsular bag were refilled with Provisc. A posterior chamber intraocular lens was placed into the capsular bag with it's injector. The implant was positioned with the Kuglan hook. The Provisc was then removed from the anterior chamber and capsular bag with the I&A handpiece. Stromal hydration of the main incision and paracentesis port was performed with BSS on a Fine canula. The wounds were tested for leak which was negative. The patient tolerated the procedure well. There were no operative complications. The patient was then transferred to the recovery room in stable condition.  Complications: None  Specimen: None  EBL: None  Prosthetic device: B&L enVista, MX60, power 20.0D, SN  [id number].

## 2013-12-14 NOTE — Anesthesia Preprocedure Evaluation (Signed)
Anesthesia Evaluation  Patient identified by MRN, date of birth, ID band Patient awake    Reviewed: Allergy & Precautions, H&P , NPO status , Patient's Chart, lab work & pertinent test results, reviewed documented beta blocker date and time   Airway Mallampati: II TM Distance: >3 FB     Dental  (+) Teeth Intact   Pulmonary neg pulmonary ROS,  breath sounds clear to auscultation        Cardiovascular hypertension, Pt. on home beta blockers and Pt. on medications Rhythm:Regular Rate:Normal     Neuro/Psych    GI/Hepatic PUD,   Endo/Other  diabetes, Type 2, Insulin Dependent and Oral Hypoglycemic Agents  Renal/GU Renal InsufficiencyRenal disease     Musculoskeletal   Abdominal   Peds  Hematology   Anesthesia Other Findings   Reproductive/Obstetrics                           Anesthesia Physical Anesthesia Plan  ASA: III  Anesthesia Plan: MAC   Post-op Pain Management:    Induction: Intravenous  Airway Management Planned: Nasal Cannula  Additional Equipment:   Intra-op Plan:   Post-operative Plan:   Informed Consent: I have reviewed the patients History and Physical, chart, labs and discussed the procedure including the risks, benefits and alternatives for the proposed anesthesia with the patient or authorized representative who has indicated his/her understanding and acceptance.     Plan Discussed with:   Anesthesia Plan Comments:         Anesthesia Quick Evaluation

## 2013-12-14 NOTE — Anesthesia Postprocedure Evaluation (Signed)
  Anesthesia Post-op Note  Patient: Maxwell Harmon  Procedure(s) Performed: Procedure(s) with comments: CATARACT EXTRACTION PHACO AND INTRAOCULAR LENS PLACEMENT (IOC) (Right) - CDE:  16.30  Patient Location: Short Stay  Anesthesia Type:MAC  Level of Consciousness: awake, alert , oriented and patient cooperative  Airway and Oxygen Therapy: Patient Spontanous Breathing  Post-op Pain: none  Post-op Assessment: Post-op Vital signs reviewed, Patient's Cardiovascular Status Stable, Respiratory Function Stable, Patent Airway, No signs of Nausea or vomiting and Pain level controlled  Post-op Vital Signs: Reviewed and stable  Complications: No apparent anesthesia complications

## 2013-12-14 NOTE — H&P (Signed)
I have reviewed the H&P, the patient was re-examined, and I have identified no interval changes in medical condition and plan of care since the history and physical of record  

## 2013-12-15 ENCOUNTER — Encounter (HOSPITAL_COMMUNITY): Payer: Self-pay | Admitting: Ophthalmology

## 2014-01-28 ENCOUNTER — Encounter (HOSPITAL_COMMUNITY): Payer: Self-pay | Admitting: Pharmacy Technician

## 2014-02-08 ENCOUNTER — Inpatient Hospital Stay (HOSPITAL_COMMUNITY): Admission: RE | Admit: 2014-02-08 | Payer: BC Managed Care – PPO | Source: Ambulatory Visit

## 2014-02-09 ENCOUNTER — Encounter (HOSPITAL_COMMUNITY)
Admission: RE | Admit: 2014-02-09 | Discharge: 2014-02-09 | Disposition: A | Discharge status: Home / Self Care | Payer: BC Managed Care – PPO | Source: Ambulatory Visit | Attending: Ophthalmology | Admitting: Ophthalmology

## 2014-02-12 MED ORDER — NEOMYCIN-POLYMYXIN-DEXAMETH 3.5-10000-0.1 OP SUSP
OPHTHALMIC | Status: AC
  Filled 2014-02-12: qty 5

## 2014-02-12 MED ORDER — PHENYLEPHRINE HCL 2.5 % OP SOLN
OPHTHALMIC | Status: AC
  Filled 2014-02-12: qty 15

## 2014-02-12 MED ORDER — TETRACAINE HCL 0.5 % OP SOLN
OPHTHALMIC | Status: AC
  Filled 2014-02-12: qty 2

## 2014-02-12 MED ORDER — CYCLOPENTOLATE-PHENYLEPHRINE OP SOLN OPTIME - NO CHARGE
OPHTHALMIC | Status: AC
  Filled 2014-02-12: qty 2

## 2014-02-12 MED ORDER — LIDOCAINE HCL (PF) 1 % IJ SOLN
INTRAMUSCULAR | Status: AC
Start: 2014-02-12 — End: 2014-02-12
  Filled 2014-02-12: qty 2

## 2014-02-12 MED ORDER — LIDOCAINE HCL 3.5 % OP GEL
OPHTHALMIC | Status: AC
  Filled 2014-02-12: qty 1

## 2014-02-15 ENCOUNTER — Encounter (HOSPITAL_COMMUNITY): Payer: Self-pay

## 2014-02-15 ENCOUNTER — Encounter (HOSPITAL_COMMUNITY): Payer: BC Managed Care – PPO | Admitting: Anesthesiology

## 2014-02-15 ENCOUNTER — Encounter (HOSPITAL_COMMUNITY)
Admission: RE | Disposition: A | Discharge status: Home / Self Care | Payer: Self-pay | Source: Ambulatory Visit | Attending: Ophthalmology

## 2014-02-15 ENCOUNTER — Ambulatory Visit (HOSPITAL_COMMUNITY)
Admission: RE | Admit: 2014-02-15 | Discharge: 2014-02-15 | Disposition: A | Discharge status: Home / Self Care | Payer: BC Managed Care – PPO | Source: Ambulatory Visit | Attending: Ophthalmology | Admitting: Ophthalmology

## 2014-02-15 ENCOUNTER — Ambulatory Visit (HOSPITAL_COMMUNITY): Payer: BC Managed Care – PPO | Admitting: Anesthesiology

## 2014-02-15 DIAGNOSIS — H2589 Other age-related cataract: Secondary | ICD-10-CM | POA: Insufficient documentation

## 2014-02-15 DIAGNOSIS — E119 Type 2 diabetes mellitus without complications: Secondary | ICD-10-CM | POA: Insufficient documentation

## 2014-02-15 DIAGNOSIS — I10 Essential (primary) hypertension: Secondary | ICD-10-CM | POA: Insufficient documentation

## 2014-02-15 DIAGNOSIS — Z794 Long term (current) use of insulin: Secondary | ICD-10-CM | POA: Insufficient documentation

## 2014-02-15 DIAGNOSIS — Z79899 Other long term (current) drug therapy: Secondary | ICD-10-CM | POA: Insufficient documentation

## 2014-02-15 HISTORY — PX: CATARACT EXTRACTION W/PHACO: SHX586

## 2014-02-15 LAB — GLUCOSE, CAPILLARY: Glucose-Capillary: 216 mg/dL — ABNORMAL HIGH (ref 70–99)

## 2014-02-15 SURGERY — PHACOEMULSIFICATION, CATARACT, WITH IOL INSERTION
Anesthesia: Monitor Anesthesia Care | Site: Eye | Laterality: Left

## 2014-02-15 MED ORDER — EPINEPHRINE HCL 1 MG/ML IJ SOLN
INTRAMUSCULAR | Status: AC
  Filled 2014-02-15: qty 1

## 2014-02-15 MED ORDER — FENTANYL CITRATE 0.05 MG/ML IJ SOLN
25.0000 ug | INTRAMUSCULAR | Status: AC
  Administered 2014-02-15 (×2): 25 ug via INTRAVENOUS

## 2014-02-15 MED ORDER — TETRACAINE HCL 0.5 % OP SOLN
1.0000 [drp] | OPHTHALMIC | Status: AC
  Administered 2014-02-15 (×3): 1 [drp] via OPHTHALMIC

## 2014-02-15 MED ORDER — BSS IO SOLN
INTRAOCULAR | Status: DC | PRN
  Administered 2014-02-15: 15 mL via INTRAOCULAR

## 2014-02-15 MED ORDER — MIDAZOLAM HCL 2 MG/2ML IJ SOLN
INTRAMUSCULAR | Status: AC
  Filled 2014-02-15: qty 2

## 2014-02-15 MED ORDER — LACTATED RINGERS IV SOLN
INTRAVENOUS | Status: DC
  Administered 2014-02-15: 09:00:00 via INTRAVENOUS

## 2014-02-15 MED ORDER — BSS IO SOLN
INTRAOCULAR | Status: DC | PRN
  Administered 2014-02-15: 09:00:00

## 2014-02-15 MED ORDER — POVIDONE-IODINE 5 % OP SOLN
OPHTHALMIC | Status: DC | PRN
  Administered 2014-02-15: 1 via OPHTHALMIC

## 2014-02-15 MED ORDER — NEOMYCIN-POLYMYXIN-DEXAMETH 3.5-10000-0.1 OP SUSP
OPHTHALMIC | Status: DC | PRN
  Administered 2014-02-15: 2 [drp] via OPHTHALMIC

## 2014-02-15 MED ORDER — CYCLOPENTOLATE-PHENYLEPHRINE 0.2-1 % OP SOLN
1.0000 [drp] | OPHTHALMIC | Status: AC
  Administered 2014-02-15 (×3): 1 [drp] via OPHTHALMIC

## 2014-02-15 MED ORDER — PHENYLEPHRINE HCL 2.5 % OP SOLN
1.0000 [drp] | OPHTHALMIC | Status: AC
Start: 2014-02-15 — End: 2014-02-15
  Administered 2014-02-15 (×3): 1 [drp] via OPHTHALMIC

## 2014-02-15 MED ORDER — MIDAZOLAM HCL 2 MG/2ML IJ SOLN
1.0000 mg | INTRAMUSCULAR | Status: DC | PRN
  Administered 2014-02-15: 2 mg via INTRAVENOUS

## 2014-02-15 MED ORDER — FENTANYL CITRATE 0.05 MG/ML IJ SOLN: 25.0000 ug | INTRAMUSCULAR | Status: DC | PRN

## 2014-02-15 MED ORDER — PROVISC 10 MG/ML IO SOLN
INTRAOCULAR | Status: DC | PRN
  Administered 2014-02-15: 0.85 mL via INTRAOCULAR

## 2014-02-15 MED ORDER — LIDOCAINE HCL 3.5 % OP GEL
1.0000 "application " | Freq: Once | OPHTHALMIC | Status: AC
  Administered 2014-02-15: 1 via OPHTHALMIC

## 2014-02-15 MED ORDER — LIDOCAINE HCL (PF) 1 % IJ SOLN
INTRAMUSCULAR | Status: DC | PRN
  Administered 2014-02-15: .8 mL

## 2014-02-15 MED ORDER — FENTANYL CITRATE 0.05 MG/ML IJ SOLN
INTRAMUSCULAR | Status: AC
  Filled 2014-02-15: qty 2

## 2014-02-15 MED ORDER — ONDANSETRON HCL 4 MG/2ML IJ SOLN: 4.0000 mg | Freq: Once | INTRAMUSCULAR | Status: DC | PRN

## 2014-02-15 SURGICAL SUPPLY — 32 items
CAPSULAR TENSION RING-AMO (OPHTHALMIC RELATED) IMPLANT
CLOTH BEACON ORANGE TIMEOUT ST (SAFETY) ×1 IMPLANT
EYE SHIELD UNIVERSAL CLEAR (GAUZE/BANDAGES/DRESSINGS) ×2 IMPLANT
GLOVE BIO SURGEON STRL SZ 6.5 (GLOVE) IMPLANT
GLOVE BIOGEL PI IND STRL 6.5 (GLOVE) IMPLANT
GLOVE BIOGEL PI IND STRL 7.0 (GLOVE) IMPLANT
GLOVE BIOGEL PI IND STRL 7.5 (GLOVE) IMPLANT
GLOVE BIOGEL PI INDICATOR 6.5 (GLOVE)
GLOVE BIOGEL PI INDICATOR 7.0 (GLOVE) ×2
GLOVE BIOGEL PI INDICATOR 7.5 (GLOVE)
GLOVE ECLIPSE 6.5 STRL STRAW (GLOVE) IMPLANT
GLOVE ECLIPSE 7.0 STRL STRAW (GLOVE) IMPLANT
GLOVE ECLIPSE 7.5 STRL STRAW (GLOVE) IMPLANT
GLOVE EXAM NITRILE LRG STRL (GLOVE) IMPLANT
GLOVE EXAM NITRILE MD LF STRL (GLOVE) IMPLANT
GLOVE SKINSENSE NS SZ6.5 (GLOVE)
GLOVE SKINSENSE NS SZ7.0 (GLOVE)
GLOVE SKINSENSE STRL SZ6.5 (GLOVE) IMPLANT
GLOVE SKINSENSE STRL SZ7.0 (GLOVE) IMPLANT
KIT VITRECTOMY (OPHTHALMIC RELATED) IMPLANT
PAD ARMBOARD 7.5X6 YLW CONV (MISCELLANEOUS) ×1 IMPLANT
PROC W NO LENS (INTRAOCULAR LENS)
PROC W SPEC LENS (INTRAOCULAR LENS)
PROCESS W NO LENS (INTRAOCULAR LENS) IMPLANT
PROCESS W SPEC LENS (INTRAOCULAR LENS) IMPLANT
RING MALYGIN (MISCELLANEOUS) IMPLANT
SIGHTPATH CAT PROC W REG LENS (Ophthalmic Related) ×2 IMPLANT
SYR TB 1ML LL NO SAFETY (SYRINGE) ×1 IMPLANT
TAPE SURG TRANSPORE 1 IN (GAUZE/BANDAGES/DRESSINGS) ×1 IMPLANT
TAPE SURGICAL TRANSPORE 1 IN (GAUZE/BANDAGES/DRESSINGS) ×1
VISCOELASTIC ADDITIONAL (OPHTHALMIC RELATED) IMPLANT
WATER STERILE IRR 250ML POUR (IV SOLUTION) ×1 IMPLANT

## 2014-02-15 NOTE — Op Note (Signed)
Date of Admission: 02/15/2014  Date of Surgery: 02/15/2014  Pre-Op Dx: Cataract Left  Eye  Post-Op Dx: Combined Cataract  Left  Eye,  Dx Code 366.19  Surgeon: Tonny Branch, M.D.  Assistants: None  Anesthesia: Topical with MAC  Indications: Painless, progressive loss of vision with compromise of daily activities.  Surgery: Cataract Extraction with Intraocular lens Implant Left Eye  Discription: The patient had dilating drops and viscous lidocaine placed into the Left eye in the pre-op holding area. After transfer to the operating room, a time out was performed. The patient was then prepped and draped. Beginning with a 50 degree blade a paracentesis port was made at the surgeon's 2 o'clock position. The anterior chamber was then filled with 1% non-preserved lidocaine. This was followed by filling the anterior chamber with Provisc.  A 2.58mm keratome blade was used to make a clear corneal incision at the temporal limbus.  A bent cystatome needle was used to create a continuous tear capsulotomy. Hydrodissection was performed with balanced salt solution on a Fine canula. The lens nucleus was then removed using the phacoemulsification handpiece. Residual cortex was removed with the I&A handpiece. The anterior chamber and capsular bag were refilled with Provisc. A posterior chamber intraocular lens was placed into the capsular bag with it's injector. The implant was positioned with the Kuglan hook. The Provisc was then removed from the anterior chamber and capsular bag with the I&A handpiece. Stromal hydration of the main incision and paracentesis port was performed with BSS on a Fine canula. The wounds were tested for leak which was negative. The patient tolerated the procedure well. There were no operative complications. The patient was then transferred to the recovery room in stable condition.  Complications: None  Specimen: None  EBL: None  Prosthetic device: B&L enVista, MX60, power 20.0D, SN  [id number].

## 2014-02-15 NOTE — Anesthesia Preprocedure Evaluation (Signed)
Anesthesia Evaluation  Patient identified by MRN, date of birth, ID band Patient awake    Reviewed: Allergy & Precautions, H&P , NPO status , Patient's Chart, lab work & pertinent test results, reviewed documented beta blocker date and time   Airway Mallampati: II TM Distance: >3 FB     Dental  (+) Teeth Intact   Pulmonary neg pulmonary ROS,  breath sounds clear to auscultation        Cardiovascular hypertension, Pt. on home beta blockers and Pt. on medications Rhythm:Regular Rate:Normal     Neuro/Psych    GI/Hepatic PUD,   Endo/Other  diabetes, Type 2, Insulin Dependent, Oral Hypoglycemic Agents  Renal/GU Renal InsufficiencyRenal disease     Musculoskeletal   Abdominal   Peds  Hematology   Anesthesia Other Findings   Reproductive/Obstetrics                           Anesthesia Physical Anesthesia Plan  ASA: III  Anesthesia Plan: MAC   Post-op Pain Management:    Induction: Intravenous  Airway Management Planned: Nasal Cannula  Additional Equipment:   Intra-op Plan:   Post-operative Plan:   Informed Consent: I have reviewed the patients History and Physical, chart, labs and discussed the procedure including the risks, benefits and alternatives for the proposed anesthesia with the patient or authorized representative who has indicated his/her understanding and acceptance.     Plan Discussed with:   Anesthesia Plan Comments:         Anesthesia Quick Evaluation

## 2014-02-15 NOTE — Transfer of Care (Signed)
Immediate Anesthesia Transfer of Care Note  Patient: Maxwell Harmon  Procedure(s) Performed: Procedure(s) with comments: CATARACT EXTRACTION PHACO AND INTRAOCULAR LENS PLACEMENT (IOC) (Left) - CDE 10.84  Patient Location: Short Stay  Anesthesia Type:MAC  Level of Consciousness: awake, alert , oriented and patient cooperative  Airway & Oxygen Therapy: Patient Spontanous Breathing  Post-op Assessment: Report given to PACU RN, Post -op Vital signs reviewed and stable and Patient moving all extremities  Post vital signs: Reviewed and stable  Complications: No apparent anesthesia complications

## 2014-02-15 NOTE — H&P (Signed)
I have reviewed the H&P, the patient was re-examined, and I have identified no interval changes in medical condition and plan of care since the history and physical of record  

## 2014-02-15 NOTE — Anesthesia Postprocedure Evaluation (Signed)
  Anesthesia Post-op Note  Patient: Maxwell Harmon  Procedure(s) Performed: Procedure(s) with comments: CATARACT EXTRACTION PHACO AND INTRAOCULAR LENS PLACEMENT (IOC) (Left) - CDE 10.84  Patient Location: Short Stay  Anesthesia Type:MAC  Level of Consciousness: awake, alert , oriented and patient cooperative  Airway and Oxygen Therapy: Patient Spontanous Breathing  Post-op Pain: none  Post-op Assessment: Post-op Vital signs reviewed, Patient's Cardiovascular Status Stable and Pain level controlled  Post-op Vital Signs: Reviewed and stable  Last Vitals:  Filed Vitals:   02/15/14 0920  BP: 104/55  Pulse:   Temp:   Resp: 66    Complications: No apparent anesthesia complications

## 2014-02-17 ENCOUNTER — Encounter (HOSPITAL_COMMUNITY): Payer: Self-pay | Admitting: Ophthalmology

## 2014-07-14 ENCOUNTER — Other Ambulatory Visit (INDEPENDENT_AMBULATORY_CARE_PROVIDER_SITE_OTHER): Payer: Self-pay | Admitting: Surgery

## 2014-07-14 MED ORDER — CEPHALEXIN 500 MG PO CAPS: 500.0000 mg | ORAL_CAPSULE | Freq: Four times a day (QID) | ORAL | Status: AC

## 2014-10-02 LAB — HEMOGLOBIN A1C: Hgb A1c MFr Bld: 9.6 % — AB (ref 4.0–6.0)

## 2014-10-07 ENCOUNTER — Encounter (HOSPITAL_COMMUNITY): Payer: Self-pay | Admitting: Ophthalmology

## 2015-08-09 LAB — HM DIABETES EYE EXAM

## 2015-08-10 ENCOUNTER — Encounter: Payer: Self-pay | Admitting: "Endocrinology

## 2015-09-02 ENCOUNTER — Ambulatory Visit (INDEPENDENT_AMBULATORY_CARE_PROVIDER_SITE_OTHER): Payer: BLUE CROSS/BLUE SHIELD | Admitting: "Endocrinology

## 2015-09-02 ENCOUNTER — Encounter: Payer: Self-pay | Admitting: "Endocrinology

## 2015-09-02 VITALS — BP 145/90 | HR 82 | Ht 70.0 in | Wt 220.0 lb

## 2015-09-02 DIAGNOSIS — E6609 Other obesity due to excess calories: Secondary | ICD-10-CM

## 2015-09-02 DIAGNOSIS — N186 End stage renal disease: Secondary | ICD-10-CM | POA: Insufficient documentation

## 2015-09-02 DIAGNOSIS — I10 Essential (primary) hypertension: Secondary | ICD-10-CM

## 2015-09-02 DIAGNOSIS — E1122 Type 2 diabetes mellitus with diabetic chronic kidney disease: Secondary | ICD-10-CM | POA: Diagnosis not present

## 2015-09-02 DIAGNOSIS — N184 Chronic kidney disease, stage 4 (severe): Secondary | ICD-10-CM | POA: Diagnosis not present

## 2015-09-02 DIAGNOSIS — E785 Hyperlipidemia, unspecified: Secondary | ICD-10-CM | POA: Diagnosis not present

## 2015-09-02 MED ORDER — INSULIN PEN NEEDLE 31G X 8 MM MISC: 1.0000 | Status: DC

## 2015-09-02 MED ORDER — INSULIN ASPART 100 UNIT/ML FLEXPEN: 10.0000 [IU] | PEN_INJECTOR | Freq: Three times a day (TID) | SUBCUTANEOUS | Status: DC

## 2015-09-02 NOTE — Progress Notes (Signed)
Subjective:    Patient ID: Maxwell Harmon, male    DOB: 03/05/1956,    Past Medical History  Diagnosis Date  . Hypertension   . Diabetes mellitus without complication ([place])   . History of TIAs   . History of bleeding ulcers   . Low kidney function    Past Surgical History  Procedure Laterality Date  . Cholecystectomy  02/2013  . Toe amputation Right 2013    [facility] toe-[facility]  . Cataract extraction w/phaco Left 02/15/2014    Procedure: CATARACT EXTRACTION PHACO AND INTRAOCULAR LENS PLACEMENT (IOC);  Surgeon: Tonny Branch, MD;  Location: [facility];  Service: Ophthalmology;  Laterality: Left;  CDE 10.84  . Cataract extraction w/phaco Right 12/14/2013    Procedure: CATARACT EXTRACTION PHACO AND INTRAOCULAR LENS PLACEMENT (IOC);  Surgeon: Tonny Branch, MD;  Location: [facility];  Service: Ophthalmology;  Laterality: Right;  CDE:  16.30  . Penile prosthesis implant     Social History   Social History  . Marital Status: Married    Spouse Name: N/A  . Number of Children: N/A  . Years of Education: N/A   Social History Main Topics  . Smoking status: Never Smoker   . Smokeless tobacco: None  . Alcohol Use: Yes     Comment: social drink   . Drug Use: No  . Sexual Activity: Not Asked   Other Topics Concern  . None   Social History Narrative   Outpatient Encounter Prescriptions as of 09/02/2015  Medication Sig  . aspirin EC 81 MG tablet Take 81 mg by mouth daily.  Marland Kitchen atorvastatin (LIPITOR) 20 MG tablet Take 20 mg by mouth daily.  . clonazePAM (KLONOPIN) 0.5 MG tablet Take 0.5 mg by mouth daily as needed for anxiety.  Marland Kitchen escitalopram (LEXAPRO) 10 MG tablet Take 10 mg by mouth daily.  . insulin glargine (LANTUS) 100 UNIT/ML injection Inject 40 Units into the skin at bedtime.  Marland Kitchen losartan (COZAAR) 100 MG tablet Take 100 mg by mouth daily.  Marland Kitchen losartan-hydrochlorothiazide (HYZAAR) 100-25 MG per tablet Take 1 tablet by mouth daily.  . metoprolol succinate (TOPROL-XL) 25 MG 24 hr  tablet Take 25 mg by mouth daily.  . [DISCONTINUED] insulin glargine (LANTUS) 100 UNIT/ML injection Inject 40 Units into the skin at bedtime.  . insulin aspart (NOVOLOG) 100 UNIT/ML FlexPen Inject 10 Units into the skin 3 (three) times daily with meals.  . Insulin Pen Needle (B-D ULTRAFINE III SHORT PEN) 31G X 8 MM MISC 1 each by Does not apply route as directed.   No facility-administered encounter medications on file as of 09/02/2015.   ALLERGIES: No Known Allergies VACCINATION STATUS:  There is no immunization history on file for this patient.  Diabetes He presents for his initial diabetic visit. He has type 2 diabetes mellitus. Onset time: he was diagnosed at approximate age of 51 yrs. His disease course has been worsening. There are no hypoglycemic associated symptoms. Pertinent negatives for hypoglycemia include no confusion, headaches, pallor or seizures. Associated symptoms include polydipsia and polyuria. Pertinent negatives for diabetes include no chest pain, no fatigue, no polyphagia and no weakness. There are no hypoglycemic complications. Symptoms are worsening. Diabetic complications include impotence and nephropathy. Risk factors for coronary artery disease include diabetes mellitus, dyslipidemia, hypertension, male sex, obesity and sedentary lifestyle. Current diabetic treatment includes insulin injections. He is compliant with treatment some of the time. He is following a generally unhealthy diet. He has had a previous visit with  a dietitian. He participates in exercise intermittently. An ACE inhibitor/angiotensin II receptor blocker is being taken. Eye exam is current.  Hyperlipidemia This is a chronic problem. The current episode started more than 1 year ago. Exacerbating diseases include diabetes and obesity. Pertinent negatives include no chest pain, myalgias or shortness of breath. Current antihyperlipidemic treatment includes statins. Risk factors for coronary artery disease  include diabetes mellitus, dyslipidemia, family history, male sex, obesity, a sedentary lifestyle and hypertension.  Hypertension Pertinent negatives include no chest pain, headaches, neck pain, palpitations or shortness of breath.       Review of Systems  Constitutional: Negative for fatigue and unexpected weight change.  HENT: Negative for dental problem, mouth sores and trouble swallowing.   Eyes: Negative for visual disturbance.  Respiratory: Negative for cough, choking, chest tightness, shortness of breath and wheezing.   Cardiovascular: Negative for chest pain, palpitations and leg swelling.  Gastrointestinal: Negative for nausea, vomiting, abdominal pain, diarrhea, constipation and abdominal distention.  Endocrine: Positive for polydipsia and polyuria. Negative for polyphagia.  Genitourinary: Positive for impotence. Negative for dysuria, urgency, hematuria and flank pain.  Musculoskeletal: Negative for myalgias, back pain, gait problem and neck pain.  Skin: Negative for pallor, rash and wound.  Neurological: Negative for seizures, syncope, weakness, numbness and headaches.  Psychiatric/Behavioral: Negative.  Negative for confusion and dysphoric mood.    Objective:    BP 145/90 mmHg  Pulse 82  Ht 5\' 10"  (1.778 m)  Wt 220 lb (99.791 kg)  BMI 31.57 kg/m2  SpO2 99%  Wt Readings from Last 3 Encounters:  09/02/15 220 lb (99.791 kg)  02/15/14 205 lb (92.987 kg)  12/07/13 212 lb (96.163 kg)    Physical Exam  Constitutional: He is oriented to person, place, and time. He appears well-developed and well-nourished. He is cooperative. No distress.  HENT:  Head: Normocephalic and atraumatic.  Eyes: EOM are normal.  Neck: Normal range of motion. Neck supple. No tracheal deviation present. No thyromegaly present.  Cardiovascular: Normal rate, S1 normal, S2 normal and normal heart sounds.  Exam reveals no gallop.   No murmur heard. Pulses:      Dorsalis pedis pulses are 1+ on the  right side, and 1+ on the left side.       Posterior tibial pulses are 1+ on the right side, and 1+ on the left side.  Pulmonary/Chest: Breath sounds normal. No respiratory distress. He has no wheezes.  Abdominal: Soft. Bowel sounds are normal. He exhibits no distension. There is no tenderness. There is no guarding and no CVA tenderness.  Musculoskeletal: He exhibits no edema.       Right shoulder: He exhibits no swelling and no deformity.  Neurological: He is alert and oriented to person, place, and time. He has normal strength and normal reflexes. No cranial nerve deficit or sensory deficit. Gait normal.  Skin: Skin is warm and dry. No rash noted. No cyanosis. Nails show no clubbing.  Psychiatric: He has a normal mood and affect. His speech is normal and behavior is normal. Judgment and thought content normal. Cognition and memory are normal.    Results for orders placed or performed in visit on 09/02/15  Hemoglobin A1c  Result Value Ref Range   Hgb A1c MFr Bld 9.6 (A) 4.0 - 6.0 %   Complete Blood Count (Most recent): Lab Results  Component Value Date   WBC 11.9* 01/30/2011   HGB 12.1* 12/07/2013   HCT 35.2* 12/07/2013   MCV 82.0 01/30/2011  PLT 208 01/30/2011   Chemistry (most recent): Lab Results  Component Value Date   NA 141 12/07/2013   K 5.2 12/07/2013   CL 104 12/07/2013   CO2 26 12/07/2013   BUN 40* 12/07/2013   CREATININE 2.14* 12/07/2013   Diabetic Labs (most recent): Lab Results  Component Value Date   HGBA1C 9.6* 10/02/2014   HGBA1C * 10/30/2009    7.5 (NOTE) The ADA recommends the following therapeutic goal for glycemic control related to Hgb A1c measurement: Goal of therapy: <6.5 Hgb A1c  Reference: American Diabetes Association: Clinical Practice Recommendations 2010, Diabetes Care, 2010, 33: (Suppl  1).      Assessment & Plan:   1. Diabetes mellitus with stage 4 chronic kidney disease ([place])  - Patient has currently uncontrolled symptomatic type 2  DM since  59 years of age,  with most recent A1c of 9.6%. Recent labs reviewed.   -his diabetes is complicated by stage 3-4 CKD and patient remains at a high risk for more acute and chronic complications of diabetes which include CAD, CVA, CKD, retinopathy, and neuropathy. These are all discussed in detail with the patient.  - I have counseled the patient on diet management and weight loss, by adopting a carbohydrate restricted/protein rich diet.  - Suggestion is made for patient to avoid simple carbohydrates   from their diet including Cakes , Desserts, Ice Cream,  Soda (  diet and regular) , Sweet Tea , Candies,  Chips, Cookies, Artificial Sweeteners,   and "Sugar-free" Products . This will help patient to have stable blood glucose profile and potentially avoid unintended weight gain.  - I encouraged the patient to switch to  unprocessed or minimally processed complex Harmon and increased protein intake (animal or plant source), fruits, and vegetables.  - Patient is advised to stick to a routine mealtimes to eat 3 meals  a day and avoid unnecessary snacks ( to snack only to correct hypoglycemia).    - I have approached patient with the following individualized plan to manage diabetes and patient agrees:   - I  will proceed with  basal insulin 40 units QHS, and prandial insulin Novolog 10 units TIDAC for pre-meal BG readings of 90-150mg /dl, plus patient specific correction dose for unexpected hyperglycemia above 150mg /dl, associated with strict monitoring of glucose  AC and HS. - Patient is warned not to take insulin without proper monitoring per orders. -Adjustment parameters are given for hypo and hyperglycemia in writing. -Patient is encouraged to call clinic for blood glucose levels less than 70 or above 300 mg /dl.  -Patient is not a candidate for MTF, SGLT2 inhibitors due to CKD.  - Patient will be considered for incretin therapy as appropriate next visit. - Patient specific target   A1c;  LDL, HDL, Triglycerides, and  Waist Circumference were discussed in detail.  2) BP/HTN: Uncontrolled. Continue current medications including ACEI/ARB. 3) Lipids/HPL:   continue statins. 4)  Weight/Diet: he has seen CDE before, exercise, and detailed carbohydrates information provided.  5) Chronic Care/Health Maintenance:  -Patient is on ACEI/ARB and Statin medications and encouraged to continue to follow up with Ophthalmology, Podiatrist at least yearly or according to recommendations, and advised to  stay away from smoking. I have recommended yearly flu vaccine and pneumonia vaccination at least every 5 years; moderate intensity exercise for up to 150 minutes weekly; and  sleep for at least 7 hours a day.   Patient to bring meter and  blood glucose logs during their next  visit.   I advised patient to maintain close follow up with their PCP for primary care needs. Follow up plan: Return in about 1 week (around 09/09/2015) for diabetes, high cholesterol, follow up with meter and logs- no labs.  Glade Lloyd, MD Phone: [phone]  Fax: [phone]   09/02/2015, 3:14 PM

## 2015-09-13 ENCOUNTER — Other Ambulatory Visit: Payer: Self-pay

## 2015-09-13 ENCOUNTER — Ambulatory Visit (INDEPENDENT_AMBULATORY_CARE_PROVIDER_SITE_OTHER): Payer: BLUE CROSS/BLUE SHIELD | Admitting: "Endocrinology

## 2015-09-13 ENCOUNTER — Encounter: Payer: Self-pay | Admitting: "Endocrinology

## 2015-09-13 VITALS — BP 151/74 | HR 64 | Ht 70.0 in | Wt 217.0 lb

## 2015-09-13 DIAGNOSIS — E1122 Type 2 diabetes mellitus with diabetic chronic kidney disease: Secondary | ICD-10-CM | POA: Diagnosis not present

## 2015-09-13 DIAGNOSIS — I10 Essential (primary) hypertension: Secondary | ICD-10-CM | POA: Diagnosis not present

## 2015-09-13 DIAGNOSIS — N184 Chronic kidney disease, stage 4 (severe): Secondary | ICD-10-CM | POA: Diagnosis not present

## 2015-09-13 DIAGNOSIS — E785 Hyperlipidemia, unspecified: Secondary | ICD-10-CM | POA: Diagnosis not present

## 2015-09-13 MED ORDER — GLUCOSE BLOOD VI STRP: ORAL_STRIP | Status: DC

## 2015-09-13 MED ORDER — BAYER CONTOUR MONITOR W/DEVICE KIT: 1.0000 | PACK | Freq: Four times a day (QID) | Status: DC

## 2015-09-13 NOTE — Patient Instructions (Signed)

## 2015-09-13 NOTE — Progress Notes (Signed)
Subjective:    Patient ID: Maxwell Harmon, male    DOB: 1956-08-14,    Past Medical History  Diagnosis Date  . Hypertension   . Diabetes mellitus without complication ([place])   . History of TIAs   . History of bleeding ulcers   . Low kidney function    Past Surgical History  Procedure Laterality Date  . Cholecystectomy  02/2013  . Toe amputation Right 2013    [facility] toe-[facility]  . Cataract extraction w/phaco Left 02/15/2014    Procedure: CATARACT EXTRACTION PHACO AND INTRAOCULAR LENS PLACEMENT (IOC);  Surgeon: Tonny Branch, MD;  Location: [facility];  Service: Ophthalmology;  Laterality: Left;  CDE 10.84  . Cataract extraction w/phaco Right 12/14/2013    Procedure: CATARACT EXTRACTION PHACO AND INTRAOCULAR LENS PLACEMENT (IOC);  Surgeon: Tonny Branch, MD;  Location: [facility];  Service: Ophthalmology;  Laterality: Right;  CDE:  16.30  . Penile prosthesis implant     Social History   Social History  . Marital Status: Married    Spouse Name: N/A  . Number of Children: N/A  . Years of Education: N/A   Social History Main Topics  . Smoking status: Never Smoker   . Smokeless tobacco: None  . Alcohol Use: Yes     Comment: social drink   . Drug Use: No  . Sexual Activity: Not Asked   Other Topics Concern  . None   Social History Narrative   Outpatient Encounter Prescriptions as of 09/13/2015  Medication Sig  . aspirin EC 81 MG tablet Take 81 mg by mouth daily.  Marland Kitchen atorvastatin (LIPITOR) 20 MG tablet Take 20 mg by mouth daily.  . clonazePAM (KLONOPIN) 0.5 MG tablet Take 0.5 mg by mouth daily as needed for anxiety.  Marland Kitchen escitalopram (LEXAPRO) 10 MG tablet Take 10 mg by mouth daily.  . insulin aspart (NOVOLOG) 100 UNIT/ML FlexPen Inject 10 Units into the skin 3 (three) times daily with meals.  . insulin glargine (LANTUS) 100 UNIT/ML injection Inject 40 Units into the skin at bedtime.  . Insulin Pen Needle (B-D ULTRAFINE III SHORT PEN) 31G X 8 MM MISC 1 each by Does not apply route  as directed.  . metoprolol succinate (TOPROL-XL) 25 MG 24 hr tablet Take 25 mg by mouth daily.  . [DISCONTINUED] losartan (COZAAR) 100 MG tablet Take 100 mg by mouth daily.  Marland Kitchen glucose blood (ACCU-CHEK COMPACT PLUS) test strip Use as instructed  . [DISCONTINUED] losartan-hydrochlorothiazide (HYZAAR) 100-25 MG per tablet Take 1 tablet by mouth daily.   No facility-administered encounter medications on file as of 09/13/2015.   ALLERGIES: No Known Allergies VACCINATION STATUS:  There is no immunization history on file for this patient.  Diabetes He presents for his follow-up diabetic visit. He has type 2 diabetes mellitus. Onset time: he was diagnosed at approximate age of 59 yrs. His disease course has been improving. There are no hypoglycemic associated symptoms. Pertinent negatives for hypoglycemia include no confusion, headaches, pallor or seizures. Pertinent negatives for diabetes include no chest pain, no fatigue, no polydipsia, no polyphagia, no polyuria and no weakness. There are no hypoglycemic complications. Symptoms are improving. Diabetic complications include impotence, nephropathy and retinopathy. Risk factors for coronary artery disease include diabetes mellitus, dyslipidemia, hypertension, male sex, obesity and sedentary lifestyle. Current diabetic treatment includes insulin injections. He is compliant with treatment most of the time. His weight is decreasing steadily. He is following a generally unhealthy diet. He has not had a previous  visit with a dietitian. He participates in exercise intermittently. His home blood glucose trend is decreasing steadily. His overall blood glucose range is 140-180 mg/dl. An ACE inhibitor/angiotensin II receptor blocker is being taken. Eye exam is current.  Hyperlipidemia This is a chronic problem. The current episode started more than 1 year ago. Exacerbating diseases include diabetes and obesity. Pertinent negatives include no chest pain, myalgias or  shortness of breath. Current antihyperlipidemic treatment includes statins. Risk factors for coronary artery disease include diabetes mellitus, dyslipidemia, family history, male sex, obesity, a sedentary lifestyle and hypertension.  Hypertension This is a chronic problem. The current episode started more than 1 year ago. The problem is uncontrolled. Pertinent negatives include no chest pain, headaches, neck pain, palpitations or shortness of breath. Past treatments include angiotensin blockers and beta blockers. Hypertensive end-organ damage includes kidney disease and retinopathy.       Review of Systems  Constitutional: Negative for fatigue and unexpected weight change.  HENT: Negative for dental problem, mouth sores and trouble swallowing.   Eyes: Negative for visual disturbance.  Respiratory: Negative for cough, choking, chest tightness, shortness of breath and wheezing.   Cardiovascular: Negative for chest pain, palpitations and leg swelling.  Gastrointestinal: Negative for nausea, vomiting, abdominal pain, diarrhea, constipation and abdominal distention.  Endocrine: Negative for polydipsia, polyphagia and polyuria.  Genitourinary: Positive for impotence. Negative for dysuria, urgency, hematuria and flank pain.  Musculoskeletal: Negative for myalgias, back pain, gait problem and neck pain.  Skin: Negative for pallor, rash and wound.  Neurological: Negative for seizures, syncope, weakness, numbness and headaches.  Psychiatric/Behavioral: Negative.  Negative for confusion and dysphoric mood.    Objective:    BP 151/74 mmHg  Pulse 64  Ht 5\' 10"  (1.778 m)  Wt 217 lb (98.431 kg)  BMI 31.14 kg/m2  SpO2 99%  Wt Readings from Last 3 Encounters:  09/13/15 217 lb (98.431 kg)  09/02/15 220 lb (99.791 kg)  02/15/14 205 lb (92.987 kg)    Physical Exam  Constitutional: He is oriented to person, place, and time. He appears well-developed and well-nourished. He is cooperative. No distress.   HENT:  Head: Normocephalic and atraumatic.  Eyes: EOM are normal.  Neck: Normal range of motion. Neck supple. No tracheal deviation present. No thyromegaly present.  Cardiovascular: Normal rate, S1 normal, S2 normal and normal heart sounds.  Exam reveals no gallop.   No murmur heard. Pulses:      Dorsalis pedis pulses are 1+ on the right side, and 1+ on the left side.       Posterior tibial pulses are 1+ on the right side, and 1+ on the left side.  Pulmonary/Chest: Breath sounds normal. No respiratory distress. He has no wheezes.  Abdominal: Soft. Bowel sounds are normal. He exhibits no distension. There is no tenderness. There is no guarding and no CVA tenderness.  Musculoskeletal: He exhibits no edema.       Right shoulder: He exhibits no swelling and no deformity.  Neurological: He is alert and oriented to person, place, and time. He has normal strength and normal reflexes. No cranial nerve deficit or sensory deficit. Gait normal.  Skin: Skin is warm and dry. No rash noted. No cyanosis. Nails show no clubbing.  Psychiatric: He has a normal mood and affect. His speech is normal and behavior is normal. Judgment and thought content normal. Cognition and memory are normal.    Results for orders placed or performed in visit on 09/02/15  Hemoglobin A1c  Result  Value Ref Range   Hgb A1c MFr Bld 9.6 (A) 4.0 - 6.0 %   Complete Blood Count (Most recent): Lab Results  Component Value Date   WBC 11.9* 01/30/2011   HGB 12.1* 12/07/2013   HCT 35.2* 12/07/2013   MCV 82.0 01/30/2011   PLT 208 01/30/2011   Chemistry (most recent): Lab Results  Component Value Date   NA 141 12/07/2013   K 5.2 12/07/2013   CL 104 12/07/2013   CO2 26 12/07/2013   BUN 40* 12/07/2013   CREATININE 2.14* 12/07/2013   Diabetic Labs (most recent): Lab Results  Component Value Date   HGBA1C 9.6* 10/02/2014   HGBA1C * 10/30/2009    7.5 (NOTE) The ADA recommends the following therapeutic goal for glycemic  control related to Hgb A1c measurement: Goal of therapy: <6.5 Hgb A1c  Reference: American Diabetes Association: Clinical Practice Recommendations 2010, Diabetes Care, 2010, 33: (Suppl  1).      Assessment & Plan:   1. Diabetes mellitus with stage 4 chronic kidney disease ([place])  - Patient has currently uncontrolled symptomatic type 2 DM since  59 years of age,  with most recent A1c of 9.6%. Recent labs reviewed. -He came with significantly improved blood glucose profile since he was put on basal/bolus insulin last visit.  -his diabetes is complicated by stage 3-4 CKD and patient remains at a high risk for more acute and chronic complications of diabetes which include CAD, CVA, CKD, retinopathy, and neuropathy. These are all discussed in detail with the patient.  - I have counseled the patient on diet management and weight loss, by adopting a carbohydrate restricted/protein rich diet.  - Suggestion is made for patient to avoid simple carbohydrates   from their diet including Cakes , Desserts, Ice Cream,  Soda (  diet and regular) , Sweet Tea , Candies,  Chips, Cookies, Artificial Sweeteners,   and "Sugar-free" Products . This will help patient to have stable blood glucose profile and potentially avoid unintended weight gain.  - I encouraged the patient to switch to  unprocessed or minimally processed complex Harmon and increased protein intake (animal or plant source), fruits, and vegetables.  - Patient is advised to stick to a routine mealtimes to eat 3 meals  a day and avoid unnecessary snacks ( to snack only to correct hypoglycemia).   -He will be scheduled for diabetes education. - I have approached patient with the following individualized plan to manage diabetes and patient agrees:   - I  will continue with  basal insulin Lantus 40 units QHS, and prandial insulin Novolog 10 units TIDAC for pre-meal BG readings of 90-150mg /dl, plus patient specific correction dose for unexpected  hyperglycemia above 150mg /dl, associated with strict monitoring of glucose  AC and HS. - Patient is warned not to take insulin without proper monitoring per orders. -Adjustment parameters are given for hypo and hyperglycemia in writing. -Patient is encouraged to call clinic for blood glucose levels less than 70 or above 300 mg /dl.  -Patient is not a candidate for MTF, SGLT2 inhibitors due to CKD. -He will be considered for low-dose DPPIV inhibitors next visit. - Patient will be considered for incretin therapy as appropriate next visit. - Patient specific target  A1c;  LDL, HDL, Triglycerides, and  Waist Circumference were discussed in detail.  2) BP/HTN: Uncontrolled. Continue current medications including ACEI/ARB. 3) Lipids/HPL:   continue statins. 4)  Weight/Diet: he has seen CDE before, exercise, and detailed carbohydrates information provided.  5) Chronic  Care/Health Maintenance:  -Patient is on ACEI/ARB and Statin medications and encouraged to continue to follow up with Ophthalmology, Podiatrist at least yearly or according to recommendations, and advised to  stay away from smoking. I have recommended yearly flu vaccine and pneumonia vaccination at least every 5 years; moderate intensity exercise for up to 150 minutes weekly; and  sleep for at least 7 hours a day.   Patient to bring meter and  blood glucose logs during their next visit.   I advised patient to maintain close follow up with their PCP for primary care needs. Follow up plan: Return in about 10 weeks (around 11/22/2015) for diabetes, high blood pressure, follow up with pre-visit labs, meter, and logs.  Glade Lloyd, MD Phone: [phone]  Fax: [phone]   09/13/2015, 8:47 AM

## 2015-11-08 ENCOUNTER — Other Ambulatory Visit: Payer: Self-pay

## 2015-11-08 LAB — HEMOGLOBIN A1C: Hemoglobin A1C: 7.5

## 2015-11-08 MED ORDER — BLOOD GLUCOSE MONITOR KIT: PACK | Status: DC

## 2015-11-08 MED ORDER — GLUCOSE BLOOD VI STRP: ORAL_STRIP | Status: DC

## 2015-11-08 MED ORDER — LANCETS MISC: 1.0000 | Freq: Four times a day (QID) | Status: DC

## 2015-11-08 MED ORDER — BAYER CONTOUR MONITOR W/DEVICE KIT: 1.0000 | PACK | Freq: Four times a day (QID) | Status: DC

## 2015-11-22 ENCOUNTER — Encounter: Payer: Self-pay | Admitting: "Endocrinology

## 2015-11-22 ENCOUNTER — Ambulatory Visit (INDEPENDENT_AMBULATORY_CARE_PROVIDER_SITE_OTHER): Payer: Managed Care, Other (non HMO) | Admitting: "Endocrinology

## 2015-11-22 VITALS — BP 144/74 | HR 70 | Ht 70.0 in | Wt 215.0 lb

## 2015-11-22 DIAGNOSIS — E6609 Other obesity due to excess calories: Secondary | ICD-10-CM

## 2015-11-22 DIAGNOSIS — N184 Chronic kidney disease, stage 4 (severe): Secondary | ICD-10-CM

## 2015-11-22 DIAGNOSIS — E1122 Type 2 diabetes mellitus with diabetic chronic kidney disease: Secondary | ICD-10-CM

## 2015-11-22 DIAGNOSIS — E785 Hyperlipidemia, unspecified: Secondary | ICD-10-CM | POA: Diagnosis not present

## 2015-11-22 DIAGNOSIS — I10 Essential (primary) hypertension: Secondary | ICD-10-CM | POA: Diagnosis not present

## 2015-11-22 NOTE — Patient Instructions (Signed)

## 2015-11-22 NOTE — Progress Notes (Signed)
Subjective:    Patient ID: Maxwell Harmon, male    DOB: Mar 27, 1956,    Past Medical History  Diagnosis Date  . Hypertension   . Diabetes mellitus without complication ([place])   . History of TIAs   . History of bleeding ulcers   . Low kidney function    Past Surgical History  Procedure Laterality Date  . Cholecystectomy  02/2013  . Toe amputation Right 2013    [facility] toe-[facility]  . Cataract extraction w/phaco Left 02/15/2014    Procedure: CATARACT EXTRACTION PHACO AND INTRAOCULAR LENS PLACEMENT (IOC);  Surgeon: Tonny Branch, MD;  Location: [facility];  Service: Ophthalmology;  Laterality: Left;  CDE 10.84  . Cataract extraction w/phaco Right 12/14/2013    Procedure: CATARACT EXTRACTION PHACO AND INTRAOCULAR LENS PLACEMENT (IOC);  Surgeon: Tonny Branch, MD;  Location: [facility];  Service: Ophthalmology;  Laterality: Right;  CDE:  16.30  . Penile prosthesis implant     Social History   Social History  . Marital Status: Married    Spouse Name: N/A  . Number of Children: N/A  . Years of Education: N/A   Social History Main Topics  . Smoking status: Never Smoker   . Smokeless tobacco: None  . Alcohol Use: Yes     Comment: social drink   . Drug Use: No  . Sexual Activity: Not Asked   Other Topics Concern  . None   Social History Narrative   Outpatient Encounter Prescriptions as of 11/22/2015  Medication Sig  . aspirin EC 81 MG tablet Take 81 mg by mouth daily.  Marland Kitchen atorvastatin (LIPITOR) 20 MG tablet Take 20 mg by mouth daily.  . clonazePAM (KLONOPIN) 0.5 MG tablet Take 0.5 mg by mouth daily as needed for anxiety.  Marland Kitchen escitalopram (LEXAPRO) 10 MG tablet Take 10 mg by mouth daily.  . insulin aspart (NOVOLOG) 100 UNIT/ML FlexPen Inject 10 Units into the skin 3 (three) times daily with meals.  . Insulin Glargine (LANTUS SOLOSTAR) 100 UNIT/ML Solostar Pen Inject 40 Units into the skin at bedtime.  . metoprolol succinate (TOPROL-XL) 25 MG 24 hr tablet Take 25 mg by mouth daily.  .  blood glucose meter kit and supplies KIT Dispense based on patient and insurance preference. Use up to four times daily as directed. (FOR ICD-9 250.00, 250.01).  . Blood Glucose Monitoring Suppl (BAYER CONTOUR MONITOR) w/Device KIT 1 each by Does not apply route 4 (four) times daily. Test 4 x daily  . glucose blood (BAYER CONTOUR TEST) test strip Use as instructed 4 x daily  . Insulin Pen Needle (B-D ULTRAFINE III SHORT PEN) 31G X 8 MM MISC 1 each by Does not apply route as directed.  . Lancets MISC 1 each by Does not apply route 4 (four) times daily.  . [DISCONTINUED] insulin glargine (LANTUS) 100 UNIT/ML injection Inject 40 Units into the skin at bedtime.   No facility-administered encounter medications on file as of 11/22/2015.   ALLERGIES: No Known Allergies VACCINATION STATUS:  There is no immunization history on file for this patient.  Diabetes He presents for his follow-up diabetic visit. He has type 2 diabetes mellitus. Onset time: he was diagnosed at approximate age of 23 yrs. His disease course has been improving. There are no hypoglycemic associated symptoms. Pertinent negatives for hypoglycemia include no confusion, headaches, pallor or seizures. Pertinent negatives for diabetes include no chest pain, no fatigue, no polydipsia, no polyphagia, no polyuria and no weakness. There are no  hypoglycemic complications. Symptoms are improving. Diabetic complications include impotence, nephropathy and retinopathy. Risk factors for coronary artery disease include diabetes mellitus, dyslipidemia, hypertension, male sex, obesity and sedentary lifestyle. Current diabetic treatment includes intensive insulin program. He is compliant with treatment most of the time. His weight is decreasing steadily. He is following a generally unhealthy diet. He has not had a previous visit with a dietitian. He participates in exercise intermittently. His home blood glucose trend is decreasing steadily. His breakfast  blood glucose range is generally 140-180 mg/dl. His lunch blood glucose range is generally 140-180 mg/dl. His dinner blood glucose range is generally 140-180 mg/dl. His overall blood glucose range is 140-180 mg/dl. An ACE inhibitor/angiotensin II receptor blocker is being taken. Eye exam is current.  Hyperlipidemia This is a chronic problem. The current episode started more than 1 year ago. Exacerbating diseases include diabetes and obesity. Pertinent negatives include no chest pain, myalgias or shortness of breath. Current antihyperlipidemic treatment includes statins. Risk factors for coronary artery disease include diabetes mellitus, dyslipidemia, family history, male sex, obesity, a sedentary lifestyle and hypertension.  Hypertension This is a chronic problem. The current episode started more than 1 year ago. The problem is uncontrolled. Pertinent negatives include no chest pain, headaches, neck pain, palpitations or shortness of breath. Past treatments include angiotensin blockers and beta blockers. Hypertensive end-organ damage includes kidney disease and retinopathy.       Review of Systems  Constitutional: Negative for fatigue and unexpected weight change.  HENT: Negative for dental problem, mouth sores and trouble swallowing.   Eyes: Negative for visual disturbance.  Respiratory: Negative for cough, choking, chest tightness, shortness of breath and wheezing.   Cardiovascular: Negative for chest pain, palpitations and leg swelling.  Gastrointestinal: Negative for nausea, vomiting, abdominal pain, diarrhea, constipation and abdominal distention.  Endocrine: Negative for polydipsia, polyphagia and polyuria.  Genitourinary: Positive for impotence. Negative for dysuria, urgency, hematuria and flank pain.  Musculoskeletal: Negative for myalgias, back pain, gait problem and neck pain.  Skin: Negative for pallor, rash and wound.  Neurological: Negative for seizures, syncope, weakness, numbness  and headaches.  Psychiatric/Behavioral: Negative.  Negative for confusion and dysphoric mood.    Objective:    BP 144/74 mmHg  Pulse 70  Ht _0  (1.778 m)  Wt 215 lb (97.523 kg)  BMI 30.85 kg/m2  SpO2 96%  Wt Readings from Last 3 Encounters:  11/22/15 215 lb (97.523 kg)  09/13/15 217 lb (98.431 kg)  09/02/15 220 lb (99.791 kg)    Physical Exam  Constitutional: He is oriented to person, place, and time. He appears well-developed and well-nourished. He is cooperative. No distress.  HENT:  Head: Normocephalic and atraumatic.  Eyes: EOM are normal.  Neck: Normal range of motion. Neck supple. No tracheal deviation present. No thyromegaly present.  Cardiovascular: Normal rate, S1 normal, S2 normal and normal heart sounds.  Exam reveals no gallop.   No murmur heard. Pulses:      Dorsalis pedis pulses are 1+ on the right side, and 1+ on the left side.       Posterior tibial pulses are 1+ on the right side, and 1+ on the left side.  Pulmonary/Chest: Breath sounds normal. No respiratory distress. He has no wheezes.  Abdominal: Soft. Bowel sounds are normal. He exhibits no distension. There is no tenderness. There is no guarding and no CVA tenderness.  Musculoskeletal: He exhibits no edema.       Right shoulder: He exhibits no swelling and no  deformity.  Neurological: He is alert and oriented to person, place, and time. He has normal strength and normal reflexes. No cranial nerve deficit or sensory deficit. Gait normal.  Skin: Skin is warm and dry. No rash noted. No cyanosis. Nails show no clubbing.  Psychiatric: He has a normal mood and affect. His speech is normal and behavior is normal. Judgment and thought content normal. Cognition and memory are normal.    Results for orders placed or performed in visit on 11/22/15  Hemoglobin A1c  Result Value Ref Range   Hemoglobin A1C 7.5    Complete Blood Count (Most recent): Lab Results  Component Value Date   WBC 11.9* 01/30/2011   HGB  12.1* 12/07/2013   HCT 35.2* 12/07/2013   MCV 82.0 01/30/2011   PLT 208 01/30/2011   Chemistry (most recent): Lab Results  Component Value Date   NA 141 12/07/2013   K 5.2 12/07/2013   CL 104 12/07/2013   CO2 26 12/07/2013   BUN 40* 12/07/2013   CREATININE 2.14* 12/07/2013   Diabetic Labs (most recent): Lab Results  Component Value Date   HGBA1C 7.5 11/08/2015   HGBA1C 9.6* 10/02/2014   HGBA1C * 10/30/2009    7.5 (NOTE) The ADA recommends the following therapeutic goal for glycemic control related to Hgb A1c measurement: Goal of therapy: <6.5 Hgb A1c  Reference: American Diabetes Association: Clinical Practice Recommendations 2010, Diabetes Care, 2010, 33: (Suppl  1).      Assessment & Plan:   1. Diabetes mellitus with stage 4 chronic kidney disease ([place])  - Patient has currently uncontrolled symptomatic type 2 DM since  60 years of age,  with most recent A1c of 7.5% improving from 9.6%. Recent labs reviewed. -He came with significantly improved blood glucose profile since he was put on basal/bolus insulin last visit.  -his diabetes is complicated by stage 3-4 CKD and patient remains at a high risk for more acute and chronic complications of diabetes which include CAD, CVA, CKD, retinopathy, and neuropathy. These are all discussed in detail with the patient.  - I have counseled the patient on diet management and weight loss, by adopting a carbohydrate restricted/protein rich diet.  - Suggestion is made for patient to avoid simple carbohydrates   from their diet including Cakes , Desserts, Ice Cream,  Soda (  diet and regular) , Sweet Tea , Candies,  Chips, Cookies, Artificial Sweeteners,   and "Sugar-free" Products . This will help patient to have stable blood glucose profile and potentially avoid unintended weight gain.  - I encouraged the patient to switch to  unprocessed or minimally processed complex Harmon and increased protein intake (animal or plant source), fruits, and  vegetables.  - Patient is advised to stick to a routine mealtimes to eat 3 meals  a day and avoid unnecessary snacks ( to snack only to correct hypoglycemia).   -He will be scheduled for diabetes education. - I have approached patient with the following individualized plan to manage diabetes and patient agrees:   - I  will continue with  basal insulin Lantus 40 units QHS, and prandial insulin Novolog 10 units TIDAC for pre-meal BG readings of 90-147m/dl, plus patient specific correction dose for unexpected hyperglycemia above 1543mdl, associated with strict monitoring of glucose  AC and HS. -I gave him samples of 2 jail to use in place of Lantus and Apidra to use in place of NovoLog since he says his insurance is not going to pay for Lantus and  NovoLog without higher co-pays. -He is asking for a cheaper insulin options, may consider Humulin 70/ 30 or  Novolin 70/30 next Visit. - Patient is warned not to take insulin without proper monitoring per orders. -Adjustment parameters are given for hypo and hyperglycemia in writing. -Patient is encouraged to call clinic for blood glucose levels less than 70 or above 300 mg /dl.  -Patient is not a candidate for MTF, SGLT2 inhibitors due to CKD. -He will be considered for low-dose DPPIV inhibitors next visit.  - Patient specific target  A1c;  LDL, HDL, Triglycerides, and  Waist Circumference were discussed in detail.  2) BP/HTN: Uncontrolled. Continue current medications including ACEI/ARB. 3) Lipids/HPL:   continue statins. 4)  Weight/Diet: he has seen CDE before, exercise, and detailed carbohydrates information provided.  5) Chronic Care/Health Maintenance:  -Patient is on ACEI/ARB and Statin medications and encouraged to continue to follow up with Ophthalmology, Podiatrist at least yearly or according to recommendations, and advised to  stay away from smoking. I have recommended yearly flu vaccine and pneumonia vaccination at least every 5 years;  moderate intensity exercise for up to 150 minutes weekly; and  sleep for at least 7 hours a day.   Patient to bring meter and  blood glucose logs during their next visit.   I advised patient to maintain close follow up with their PCP for primary care needs. Follow up plan: Return in about 3 months (around 02/20/2016) for diabetes, high blood pressure, high cholesterol, follow up with pre-visit labs, meter, and logs.  Glade Lloyd, MD Phone: [phone]  Fax: [phone]   11/22/2015, 10:37 AM

## 2016-02-23 ENCOUNTER — Ambulatory Visit: Payer: Managed Care, Other (non HMO) | Admitting: "Endocrinology

## 2018-12-08 ENCOUNTER — Other Ambulatory Visit: Payer: Self-pay | Admitting: Urology

## 2018-12-10 ENCOUNTER — Other Ambulatory Visit (HOSPITAL_COMMUNITY): Payer: Self-pay | Admitting: Urology

## 2018-12-10 DIAGNOSIS — N19 Unspecified kidney failure: Secondary | ICD-10-CM

## 2018-12-22 ENCOUNTER — Encounter (HOSPITAL_COMMUNITY): Payer: Self-pay | Admitting: *Deleted

## 2018-12-30 ENCOUNTER — Other Ambulatory Visit: Payer: Self-pay | Admitting: Student

## 2018-12-31 ENCOUNTER — Ambulatory Visit (HOSPITAL_COMMUNITY): Admission: RE | Admit: 2018-12-31 | Payer: Managed Care, Other (non HMO) | Source: Ambulatory Visit

## 2018-12-31 ENCOUNTER — Encounter (HOSPITAL_COMMUNITY): Payer: Self-pay

## 2019-01-01 ENCOUNTER — Other Ambulatory Visit: Payer: Self-pay | Admitting: Radiology

## 2019-01-01 NOTE — Patient Instructions (Addendum)
Maxwell Harmon  01/01/2019   Your procedure is scheduled on: 01-08-19    Report to [facility] Main  Entrance    Report to Admitting at 9:30 AM    Call this number if you have problems the morning of surgery [phone]    Remember: Do not eat food or drink liquids :After Midnight.     Take these medicines the morning of surgery with A SIP OF WATER: Atorvastatin (Lipitor), Clonazepam (Klonopin), prn, Clonidine (Catapres) and Hydralazine (Apresoline)  DO NOT TAKE ANY DIABETIC MEDICATIONS DAY OF YOUR SURGERY                               You may not have any metal on your body including hair pins and              piercings  Do not wear jewelry, cologne, lotions, powders or deodorant             Men may shave face and neck.   Do not bring valuables to the hospital. [place].  Contacts, dentures or bridgework may not be worn into surgery.  Leave suitcase in the car. After surgery it may be brought to your room.     Patients discharged the day of surgery will not be allowed to drive home. IF YOU ARE HAVING SURGERY AND GOING HOME THE SAME DAY, YOU MUST HAVE AN ADULT TO DRIVE YOU HOME AND BE WITH YOU FOR 24 HOURS. YOU MAY GO HOME BY TAXI OR UBER OR ORTHERWISE, BUT AN ADULT MUST ACCOMPANY YOU HOME AND STAY WITH YOU FOR 24 HOURS.   Special Instructions: N/A              Please read over the following fact sheets you were given: _____________________________________________________________________ How to Manage Your Diabetes Before and After Surgery  Why is it important to control my blood sugar before and after surgery? . Improving blood sugar levels before and after surgery helps healing and can limit problems. . A way of improving blood sugar control is eating a healthy diet by: o  Eating less sugar and carbohydrates o  Increasing activity/exercise o  Talking with your doctor about reaching your blood sugar  goals . High blood sugars (greater than 180 mg/dL) can raise your risk of infections and slow your recovery, so you will need to focus on controlling your diabetes during the weeks before surgery. . Make sure that the doctor who takes care of your diabetes knows about your planned surgery including the date and location.  How do I manage my blood sugar before surgery? . Check your blood sugar at least 4 times a day, starting 2 days before surgery, to make sure that the level is not too high or low. o Check your blood sugar the morning of your surgery when you wake up and every 2 hours until you get to the Short Stay unit. . If your blood sugar is less than 70 mg/dL, you will need to treat for low blood sugar: o Do not take insulin. o Treat a low blood sugar (less than 70 mg/dL) with  cup of clear juice (cranberry or apple), 4 glucose tablets, OR glucose gel. o Recheck blood sugar in 15  minutes after treatment (to make sure it is greater than 70 mg/dL). If your blood sugar is not greater than 70 mg/dL on recheck, call [phone] for further instructions. . Report your blood sugar to the short stay nurse when you get to Short Stay.  . If you are admitted to the hospital after surgery: o Your blood sugar will be checked by the staff and you will probably be given insulin after surgery (instead of oral diabetes medicines) to make sure you have good blood sugar levels. o The goal for blood sugar control after surgery is 80-180 mg/dL.   WHAT DO I DO ABOUT MY DIABETES MEDICATION?  Marland Kitchen Do not take oral diabetes medicines (pills) the morning of surgery.  . THE DAY BEFORE SURGERY, take your usual dose of Januvia. Do not take your 30-50 units of Glargine insulin at bedtime.      Reviewed and Endorsed by [facility] Patient Education Committee, August 2015            [facility] - Preparing for Surgery Before surgery, you can play an important role.  Because skin is not sterile, your skin needs to  be as free of germs as possible.  You can reduce the number of germs on your skin by washing with CHG (chlorahexidine gluconate) soap before surgery.  CHG is an antiseptic cleaner which kills germs and bonds with the skin to continue killing germs even after washing. Please DO NOT use if you have an allergy to CHG or antibacterial soaps.  If your skin becomes reddened/irritated stop using the CHG and inform your nurse when you arrive at Short Stay. Do not shave (including legs and underarms) for at least 48 hours prior to the first CHG shower.  You may shave your face/neck. Please follow these instructions carefully:  1.  Shower with CHG Soap the night before surgery and the  morning of Surgery.  2.  If you choose to wash your hair, wash your hair first as usual with your  normal  shampoo.  3.  After you shampoo, rinse your hair and body thoroughly to remove the  shampoo.                           4.  Use CHG as you would any other liquid soap.  You can apply chg directly  to the skin and wash                       Gently with a scrungie or clean washcloth.  5.  Apply the CHG Soap to your body ONLY FROM THE NECK DOWN.   Do not use on face/ open                           Wound or open sores. Avoid contact with eyes, ears mouth and genitals (private parts).                       Wash face,  Genitals (private parts) with your normal soap.             6.  Wash thoroughly, paying special attention to the area where your surgery  will be performed.  7.  Thoroughly rinse your body with warm water from the neck down.  8.  DO NOT shower/wash with your normal soap after using and rinsing off  the CHG  Soap.                9.  Pat yourself dry with a clean towel.            10.  Wear clean pajamas.            11.  Place clean sheets on your bed the night of your first shower and do not  sleep with pets. Day of Surgery : Do not apply any lotions/deodorants the morning of surgery.  Please wear clean clothes to  the hospital/surgery center.  FAILURE TO FOLLOW THESE INSTRUCTIONS MAY RESULT IN THE CANCELLATION OF YOUR SURGERY PATIENT SIGNATURE_________________________________  NURSE SIGNATURE__________________________________  ________________________________________________________________________  WHAT IS A BLOOD TRANSFUSION? Blood Transfusion Information  A transfusion is the replacement of blood or some of its parts. Blood is made up of multiple cells which provide different functions.  Red blood cells carry oxygen and are used for blood loss replacement.  White blood cells fight against infection.  Platelets control bleeding.  Plasma helps clot blood.  Other blood products are available for specialized needs, such as hemophilia or other clotting disorders. BEFORE THE TRANSFUSION  Who gives blood for transfusions?   Healthy volunteers who are fully evaluated to make sure their blood is safe. This is blood bank blood. Transfusion therapy is the safest it has ever been in the practice of medicine. Before blood is taken from a donor, a complete history is taken to make sure that person has no history of diseases nor engages in risky social behavior (examples are intravenous drug use or sexual activity with multiple partners). The donor's travel history is screened to minimize risk of transmitting infections, such as malaria. The donated blood is tested for signs of infectious diseases, such as HIV and hepatitis. The blood is then tested to be sure it is compatible with you in order to minimize the chance of a transfusion reaction. If you or a relative donates blood, this is often done in anticipation of surgery and is not appropriate for emergency situations. It takes many days to process the donated blood. RISKS AND COMPLICATIONS Although transfusion therapy is very safe and saves many lives, the main dangers of transfusion include:   Getting an infectious disease.  Developing a transfusion  reaction. This is an allergic reaction to something in the blood you were given. Every precaution is taken to prevent this. The decision to have a blood transfusion has been considered carefully by your caregiver before blood is given. Blood is not given unless the benefits outweigh the risks. AFTER THE TRANSFUSION  Right after receiving a blood transfusion, you will usually feel much better and more energetic. This is especially true if your red blood cells have gotten low (anemic). The transfusion raises the level of the red blood cells which carry oxygen, and this usually causes an energy increase.  The nurse administering the transfusion will monitor you carefully for complications. HOME CARE INSTRUCTIONS  No special instructions are needed after a transfusion. You may find your energy is better. Speak with your caregiver about any limitations on activity for underlying diseases you may have. SEEK MEDICAL CARE IF:   Your condition is not improving after your transfusion.  You develop redness or irritation at the intravenous (IV) site. SEEK IMMEDIATE MEDICAL CARE IF:  Any of the following symptoms occur over the next 12 hours:  Shaking chills.  You have a temperature by mouth above 102 F (38.9 C), not controlled by medicine.  Chest, back, or muscle pain.  People around you feel you are not acting correctly or are confused.  Shortness of breath or difficulty breathing.  Dizziness and fainting.  You get a rash or develop hives.  You have a decrease in urine output.  Your urine turns a dark color or changes to pink, red, or brown. Any of the following symptoms occur over the next 10 days:  You have a temperature by mouth above 102 F (38.9 C), not controlled by medicine.  Shortness of breath.  Weakness after normal activity.  The white part of the eye turns yellow (jaundice).  You have a decrease in the amount of urine or are urinating less often.  Your urine turns a  dark color or changes to pink, red, or brown. Document Released: 10/19/2000 Document Revised: 01/14/2012 Document Reviewed: 06/07/2008 [facility] Patient Information 2014 [place], [place].  _______________________________________________________________________

## 2019-01-01 NOTE — Progress Notes (Signed)
09-22-18 Chart Everywhere Stress Test

## 2019-01-02 ENCOUNTER — Encounter (INDEPENDENT_AMBULATORY_CARE_PROVIDER_SITE_OTHER): Payer: Self-pay

## 2019-01-02 ENCOUNTER — Other Ambulatory Visit: Payer: Self-pay

## 2019-01-02 ENCOUNTER — Encounter (HOSPITAL_COMMUNITY): Payer: Self-pay

## 2019-01-02 ENCOUNTER — Encounter (HOSPITAL_COMMUNITY)
Admission: RE | Admit: 2019-01-02 | Discharge: 2019-01-02 | Disposition: A | Discharge status: Home / Self Care | Payer: Managed Care, Other (non HMO) | Source: Ambulatory Visit | Attending: Urology | Admitting: Urology

## 2019-01-02 DIAGNOSIS — C641 Malignant neoplasm of right kidney, except renal pelvis: Secondary | ICD-10-CM | POA: Insufficient documentation

## 2019-01-02 DIAGNOSIS — I10 Essential (primary) hypertension: Secondary | ICD-10-CM | POA: Insufficient documentation

## 2019-01-02 DIAGNOSIS — R001 Bradycardia, unspecified: Secondary | ICD-10-CM | POA: Insufficient documentation

## 2019-01-02 DIAGNOSIS — Z01818 Encounter for other preprocedural examination: Secondary | ICD-10-CM

## 2019-01-02 DIAGNOSIS — G459 Transient cerebral ischemic attack, unspecified: Secondary | ICD-10-CM

## 2019-01-02 LAB — HEMOGLOBIN A1C
Hgb A1c MFr Bld: 6.9 % — ABNORMAL HIGH (ref 4.8–5.6)
Mean Plasma Glucose: 151.33 mg/dL

## 2019-01-02 LAB — BASIC METABOLIC PANEL
Anion gap: 9 (ref 5–15)
BUN: 76 mg/dL — ABNORMAL HIGH (ref 8–23)
CALCIUM: 8.9 mg/dL (ref 8.9–10.3)
CO2: 22 mmol/L (ref 22–32)
Chloride: 108 mmol/L (ref 98–111)
Creatinine, Ser: 5.07 mg/dL — ABNORMAL HIGH (ref 0.61–1.24)
GFR calc Af Amer: 13 mL/min — ABNORMAL LOW (ref >60)
GFR calc non Af Amer: 11 mL/min — ABNORMAL LOW (ref >60)
Glucose, Bld: 159 mg/dL — ABNORMAL HIGH (ref 70–99)
Potassium: 5 mmol/L (ref 3.5–5.1)
Sodium: 139 mmol/L (ref 135–145)

## 2019-01-02 LAB — GLUCOSE, CAPILLARY: GLUCOSE-CAPILLARY: 146 mg/dL — AB (ref 70–99)

## 2019-01-02 LAB — CBC
HCT: 29.9 % — ABNORMAL LOW (ref 39.0–52.0)
Hemoglobin: 9.6 g/dL — ABNORMAL LOW (ref 13.0–17.0)
MCH: 31 pg (ref 26.0–34.0)
MCHC: 32.1 g/dL (ref 30.0–36.0)
MCV: 96.5 fL (ref 80.0–100.0)
Platelets: 121 10*3/uL — ABNORMAL LOW (ref 150–400)
RBC: 3.1 MIL/uL — ABNORMAL LOW (ref 4.22–5.81)
RDW: 12.6 % (ref 11.5–15.5)
WBC: 5.4 10*3/uL (ref 4.0–10.5)
nRBC: 0 % (ref 0.0–0.2)

## 2019-01-02 NOTE — Progress Notes (Signed)
PCP: Dr. [name]  CARDIOLOGIST: Dr. Mariea Stable  INFO IN Epic:  10-07-18 (Stress Test), ECHO on 10-02-18, and 09-22-18 EKG (See chart everywhere)  INFO ON CHART:  BLOOD THINNERS AND LAST DOSES: ASA 81 mg, last dose 12/21/18 ____________________________________  PATIENT SYMPTOMS AT TIME OF PREOP:  History of TIA, DM, and HTN. Pt making preparation for pre-transplant (kidney) evaluation,  and above procedures were performed.

## 2019-01-05 ENCOUNTER — Other Ambulatory Visit: Payer: Self-pay

## 2019-01-05 ENCOUNTER — Other Ambulatory Visit (HOSPITAL_COMMUNITY): Payer: Self-pay | Admitting: Urology

## 2019-01-05 ENCOUNTER — Ambulatory Visit (HOSPITAL_COMMUNITY)
Admission: RE | Admit: 2019-01-05 | Discharge: 2019-01-05 | Disposition: A | Discharge status: Home / Self Care | Payer: Managed Care, Other (non HMO) | Source: Ambulatory Visit | Attending: Urology | Admitting: Urology

## 2019-01-05 ENCOUNTER — Encounter (HOSPITAL_COMMUNITY): Payer: Self-pay

## 2019-01-05 DIAGNOSIS — I129 Hypertensive chronic kidney disease with stage 1 through stage 4 chronic kidney disease, or unspecified chronic kidney disease: Secondary | ICD-10-CM

## 2019-01-05 DIAGNOSIS — Z794 Long term (current) use of insulin: Secondary | ICD-10-CM | POA: Insufficient documentation

## 2019-01-05 DIAGNOSIS — Z79899 Other long term (current) drug therapy: Secondary | ICD-10-CM | POA: Insufficient documentation

## 2019-01-05 DIAGNOSIS — N19 Unspecified kidney failure: Secondary | ICD-10-CM

## 2019-01-05 DIAGNOSIS — N184 Chronic kidney disease, stage 4 (severe): Secondary | ICD-10-CM | POA: Insufficient documentation

## 2019-01-05 DIAGNOSIS — E1122 Type 2 diabetes mellitus with diabetic chronic kidney disease: Secondary | ICD-10-CM

## 2019-01-05 HISTORY — PX: IR US GUIDE VASC ACCESS RIGHT: IMG2390

## 2019-01-05 HISTORY — PX: IR FLUORO GUIDE CV LINE RIGHT: IMG2283

## 2019-01-05 LAB — PROTIME-INR
INR: 1 (ref 0.8–1.2)
Prothrombin Time: 13.4 seconds (ref 11.4–15.2)

## 2019-01-05 LAB — CBC
HCT: 29.9 % — ABNORMAL LOW (ref 39.0–52.0)
Hemoglobin: 9.8 g/dL — ABNORMAL LOW (ref 13.0–17.0)
MCH: 30.2 pg (ref 26.0–34.0)
MCHC: 32.8 g/dL (ref 30.0–36.0)
MCV: 92.3 fL (ref 80.0–100.0)
Platelets: 125 10*3/uL — ABNORMAL LOW (ref 150–400)
RBC: 3.24 MIL/uL — ABNORMAL LOW (ref 4.22–5.81)
RDW: 12.3 % (ref 11.5–15.5)
WBC: 4 10*3/uL (ref 4.0–10.5)
nRBC: 0 % (ref 0.0–0.2)

## 2019-01-05 LAB — GLUCOSE, CAPILLARY: GLUCOSE-CAPILLARY: 97 mg/dL (ref 70–99)

## 2019-01-05 MED ORDER — HEPARIN SODIUM (PORCINE) 1000 UNIT/ML IJ SOLN
INTRAMUSCULAR | Status: AC
  Filled 2019-01-05: qty 1

## 2019-01-05 MED ORDER — CEFAZOLIN SODIUM-DEXTROSE 2-4 GM/100ML-% IV SOLN
INTRAVENOUS | Status: AC
  Filled 2019-01-05: qty 100

## 2019-01-05 MED ORDER — FENTANYL CITRATE (PF) 100 MCG/2ML IJ SOLN
INTRAMUSCULAR | Status: AC
  Filled 2019-01-05: qty 2

## 2019-01-05 MED ORDER — MIDAZOLAM HCL 2 MG/2ML IJ SOLN
INTRAMUSCULAR | Status: AC | PRN
  Administered 2019-01-05: 1 mg via INTRAVENOUS

## 2019-01-05 MED ORDER — FENTANYL CITRATE (PF) 100 MCG/2ML IJ SOLN
INTRAMUSCULAR | Status: AC | PRN
  Administered 2019-01-05: 25 ug via INTRAVENOUS

## 2019-01-05 MED ORDER — LIDOCAINE HCL 1 % IJ SOLN
INTRAMUSCULAR | Status: AC
  Filled 2019-01-05: qty 20

## 2019-01-05 MED ORDER — LIDOCAINE HCL 1 % IJ SOLN
INTRAMUSCULAR | Status: AC | PRN
  Administered 2019-01-05: 10 mL

## 2019-01-05 MED ORDER — CEFAZOLIN SODIUM-DEXTROSE 2-4 GM/100ML-% IV SOLN
2.0000 g | Freq: Once | INTRAVENOUS | Status: AC
  Administered 2019-01-05: 2 g via INTRAVENOUS

## 2019-01-05 MED ORDER — MIDAZOLAM HCL 2 MG/2ML IJ SOLN
INTRAMUSCULAR | Status: AC
  Filled 2019-01-05: qty 2

## 2019-01-05 MED ORDER — SODIUM CHLORIDE 0.9 % IV SOLN: INTRAVENOUS | Status: DC

## 2019-01-05 NOTE — H&P (Addendum)
Chief Complaint: Patient was seen in consultation today for tunneled dialysis catheter placement at the request of [place]  Referring Physician(s): [place]  Supervising Physician: Aletta Edouard  Patient Status: [facility] - Out-pt  History of Present Illness: Maxwell Harmon is a 63 y.o. male   CKD for years; secondary hypertension and dialbetes Followed by Dr "Elita Quick" in [place], [place] ([place])  Has been worked up for transplant list and found right renal mass Was referred to Dr Dutch Gray Presumed renal cancer Now for right nephrectomy Thursday this week  Likelihood of worsening renal failure is high per Dr Alinda Money Will need permanent access for dialysis Pt plans for home peritoneal dialysis, but cannot have placement of peritoneal access for weeks after nephrectomy Requested now is tunneled dialysis catheter for use until can have new peritoneal access placed safely and useable  Past Medical History:  Diagnosis Date  . Diabetes mellitus without complication ([place])   . History of bleeding ulcers   . History of TIAs   . Hypertension   . Low kidney function     Past Surgical History:  Procedure Laterality Date  . CATARACT EXTRACTION W/PHACO Left 02/15/2014   Procedure: CATARACT EXTRACTION PHACO AND INTRAOCULAR LENS PLACEMENT (IOC);  Surgeon: Tonny Branch, MD;  Location: [facility];  Service: Ophthalmology;  Laterality: Left;  CDE 10.84  . CATARACT EXTRACTION W/PHACO Right 12/14/2013   Procedure: CATARACT EXTRACTION PHACO AND INTRAOCULAR LENS PLACEMENT (IOC);  Surgeon: Tonny Branch, MD;  Location: [facility];  Service: Ophthalmology;  Laterality: Right;  CDE:  16.30  . CHOLECYSTECTOMY  02/2013  . EYE SURGERY    . PENILE PROSTHESIS IMPLANT    . TOE AMPUTATION Right 2013   [facility]    Allergies: Patient has no known allergies.  Medications: Prior to Admission medications   Medication Sig Start Date End Date Taking? Authorizing Provider    atorvastatin (LIPITOR) 20 MG tablet Take 20 mg by mouth daily.   Yes [provider]  blood glucose meter kit and supplies KIT Dispense based on patient and insurance preference. Use up to four times daily as directed. (FOR ICD-9 250.00, 250.01). 11/08/15  Yes Nida, Marella Chimes, MD  Blood Glucose Monitoring Suppl (BAYER CONTOUR MONITOR) w/Device KIT 1 each by Does not apply route 4 (four) times daily. Test 4 x daily 11/08/15  Yes Nida, Marella Chimes, MD  cloNIDine (CATAPRES) 0.2 MG tablet Take 0.2 mg by mouth 2 (two) times daily.   Yes [provider]  escitalopram (LEXAPRO) 10 MG tablet Take 10 mg by mouth daily.   Yes [provider]  glucose blood (BAYER CONTOUR TEST) test strip Use as instructed 4 x daily 11/08/15  Yes Nida, Marella Chimes, MD  hydrALAZINE (APRESOLINE) 100 MG tablet Take 100 mg by mouth 3 (three) times daily.   Yes [provider]  Insulin Pen Needle (B-D ULTRAFINE III SHORT PEN) 31G X 8 MM MISC 1 each by Does not apply route as directed. 09/02/15  Yes Nida, Marella Chimes, MD  Iron-FA-B Cmp-C-Biot-Probiotic (FUSION PLUS) CAPS Take 1 tablet by mouth daily. 11/20/18  Yes [provider]  Lancets MISC 1 each by Does not apply route 4 (four) times daily. 11/08/15  Yes Nida, Marella Chimes, MD  losartan (COZAAR) 50 MG tablet Take 50 mg by mouth daily.   Yes [provider]  metoprolol succinate (TOPROL-XL) 25 MG 24 hr tablet Take 25 mg by mouth daily.   Yes [provider]  sitaGLIPtin (  JANUVIA) 50 MG tablet Take 50 mg by mouth daily.   Yes [provider]  sodium bicarbonate 650 MG tablet Take 650 mg by mouth 2 (two) times daily.   Yes [provider]  aspirin EC 81 MG tablet Take 81 mg by mouth daily.    [provider]  clonazePAM (KLONOPIN) 0.5 MG tablet Take 0.5 mg by mouth daily as needed for anxiety.    [provider]  Insulin Glargine (BASAGLAR KWIKPEN) 100 UNIT/ML SOPN  Inject 30-50 Units into the skin at bedtime.    [provider]     Family History  Problem Relation Age of Onset  . Stroke Mother   . Stroke Father   . Cancer Father   . Cancer Brother     Social History   Socioeconomic History  . Marital status: Married    Spouse name: Not on file  . Number of children: Not on file  . Years of education: Not on file  . Highest education level: Not on file  Occupational History  . Not on file  Social Needs  . Financial resource strain: Not on file  . Food insecurity:    Worry: Not on file    Inability: Not on file  . Transportation needs:    Medical: Not on file    Non-medical: Not on file  Tobacco Use  . Smoking status: Never Smoker  . Smokeless tobacco: Never Used  Substance and Sexual Activity  . Alcohol use: Yes    Comment: social drink   . Drug use: No  . Sexual activity: Not on file  Lifestyle  . Physical activity:    Days per week: Not on file    Minutes per session: Not on file  . Stress: Not on file  Relationships  . Social connections:    Talks on phone: Not on file    Gets together: Not on file    Attends religious service: Not on file    Active member of club or organization: Not on file    Attends meetings of clubs or organizations: Not on file    Relationship status: Not on file  Other Topics Concern  . Not on file  Social History Narrative  . Not on file     Review of Systems: A 12 point ROS discussed and pertinent positives are indicated in the HPI above.  All other systems are negative.  Review of Systems  Constitutional: Negative for activity change, fatigue and fever.  Respiratory: Negative for shortness of breath.   Cardiovascular: Negative for chest pain.  Gastrointestinal: Negative for abdominal pain.  Neurological: Negative for weakness.  Psychiatric/Behavioral: Negative for behavioral problems and confusion.    Vital Signs: BP 120/61   Pulse (!) 57   Temp 97.8 F (36.6 C)   Ht  '5\' 10"'  (1.778 m)   Wt 197 lb (89.4 kg)   SpO2 99%   BMI 28.27 kg/m   Physical Exam Vitals signs reviewed.  Cardiovascular:     Rate and Rhythm: Normal rate and regular rhythm.     Heart sounds: Normal heart sounds.  Pulmonary:     Effort: Pulmonary effort is normal.     Breath sounds: Normal breath sounds.  Abdominal:     Palpations: Abdomen is soft.  Musculoskeletal: Normal range of motion.  Skin:    General: Skin is warm and dry.  Neurological:     Mental Status: He is alert and oriented to person, place, and time.  Psychiatric:        Mood and Affect: Mood normal.        Behavior: Behavior normal.        Thought Content: Thought content normal.        Judgment: Judgment normal.     Imaging: No results found.  Labs:  CBC: Recent Labs    01/02/19 1153 01/05/19 0839  WBC 5.4 4.0  HGB 9.6* 9.8*  HCT 29.9* 29.9*  PLT 121* 125*    COAGS: Recent Labs    01/05/19 0839  INR 1.0    BMP: Recent Labs    01/02/19 1153  NA 139  K 5.0  CL 108  CO2 22  GLUCOSE 159*  BUN 76*  CALCIUM 8.9  CREATININE 5.07*  GFRNONAA 11*  GFRAA 13*    LIVER FUNCTION TESTS: No results for input(s): BILITOT, AST, ALT, ALKPHOS, PROT, ALBUMIN in the last 8760 hours.  TUMOR MARKERS: No results for input(s): AFPTM, CEA, CA199, CHROMGRNA in the last 8760 hours.  Assessment and Plan:  Chronic renal failure: HTN; DM Renal transplant list Work up revealed Rt renal mass-- presumed cancer per Dr Alinda Money Right nephrectomy planned for 01/08/19 Will need tunneled dialysis catheter placed for use until can safely place and use peritoneal dialysis catheter Risks and benefits discussed with the patient including, but not limited to bleeding, infection, vascular injury, pneumothorax which may require chest tube placement, air embolism or even death  All of the patient's questions were answered, patient is agreeable to proceed. Consent signed and in chart.    Thank you for this  interesting consult.  I greatly enjoyed meeting RAKIM MOONE and look forward to participating in their care.  A copy of this report was sent to the requesting provider on this date.  Electronically Signed: Lavonia Drafts, PA-C 01/05/2019, 9:16 AM   I spent a total of  30 Minutes   in face to face in clinical consultation, greater than 50% of which was counseling/coordinating care for tunneled dialysis catheter placement

## 2019-01-05 NOTE — Progress Notes (Signed)
Pair of blue clamps were given to patient's wife and instruction given.

## 2019-01-05 NOTE — Anesthesia Preprocedure Evaluation (Addendum)
Anesthesia Evaluation  Patient identified by MRN, date of birth, ID band Patient awake    Reviewed: Allergy & Precautions, NPO status , Patient's Chart, lab work & pertinent test results, reviewed documented beta blocker date and time   Airway Mallampati: IV  TM Distance: >3 FB Neck ROM: Full    Dental no notable dental hx.    Pulmonary neg pulmonary ROS,    Pulmonary exam normal breath sounds clear to auscultation       Cardiovascular hypertension, Pt. on medications and Pt. on home beta blockers Normal cardiovascular exam Rhythm:Regular Rate:Normal  ECG: rate 51, SB   Neuro/Psych PSYCHIATRIC DISORDERS TIA   GI/Hepatic negative GI ROS, Neg liver ROS,   Endo/Other  diabetes, Insulin Dependent, Oral Hypoglycemic Agents  Renal/GU ESRFRenal disease     Musculoskeletal negative musculoskeletal ROS (+)   Abdominal   Peds  Hematology  (+) anemia , HLD Thrombocytopenia   Anesthesia Other Findings RIGHT RENAL NEOPLASM  Reproductive/Obstetrics                          Anesthesia Physical Anesthesia Plan  ASA: II  Anesthesia Plan: General   Post-op Pain Management:    Induction: Intravenous  PONV Risk Score and Plan: 3 and Ondansetron, Dexamethasone, Treatment may vary due to age or medical condition and Midazolam  Airway Management Planned: Oral ETT  Additional Equipment:   Intra-op Plan:   Post-operative Plan: Extubation in OR  Informed Consent: I have reviewed the patients History and Physical, chart, labs and discussed the procedure including the risks, benefits and alternatives for the proposed anesthesia with the patient or authorized representative who has indicated his/her understanding and acceptance.     Dental advisory given  Plan Discussed with: CRNA  Anesthesia Plan Comments: (Reviewed PAT visit 01/02/19, Konrad Felix, PA-C)     Anesthesia Quick Evaluation

## 2019-01-05 NOTE — Procedures (Signed)
Interventional Radiology Procedure Note  Procedure: Tunneled HD catheter  Complications: None  Estimated Blood Loss: < 10 mL  Findings: Right IJ 23 cm tip to cuff length Palindrome catheter placed with tip in RA. OK to use.  Venetia Night. Kathlene Cote, M.D Pager:  [phone]

## 2019-01-05 NOTE — Progress Notes (Signed)
Anesthesia Chart Review   Case:  [id number] Date/Time:  01/08/19 1115   Procedure:  LAPAROSCOPIC RADICAL NEPHRECTOMY (Right )   Anesthesia type:  General   Pre-op diagnosis:  RIGHT RENAL NEOPLASM   Location:  [place] / [facility]   Surgeon:  Raynelle Bring, MD      DISCUSSION: 63 yo never smoker with h/o HTN, DM II, CKD Stage 4, right renal neoplasm scheduled for above procedure 01/08/19 with Dr. Raynelle Bring.   Pt had been worked up for kidney transplant, right renal mass found, nephrectomy scheduled.  Access for dialysis placed 01/05/2019; tunneled dialysis catheter placed for use until new peritoneal access can be placed after nephrectomy.   Pt can proceed with planned procedure barring acute status change.  VS: BP (!) 113/51   Pulse (!) 50   Temp 36.5 C (Oral)   Resp 18   Ht 5' 10.5" (1.791 m)   Wt 88.6 kg   SpO2 99%   BMI 27.62 kg/m   PROVIDERS: [name], DO is PCP  Mariea Stable, MD is Cardiologist  LABS: Labs reviewed: Acceptable for surgery. (all labs ordered are listed, but only abnormal results are displayed)  Labs Reviewed  BASIC METABOLIC PANEL - Abnormal; Notable for the following components:      Result Value   Glucose, Bld 159 (*)    BUN 76 (*)    Creatinine, Ser 5.07 (*)    GFR calc non Af Amer 11 (*)    GFR calc Af Amer 13 (*)    All other components within normal limits  CBC - Abnormal; Notable for the following components:   RBC 3.10 (*)    Hemoglobin 9.6 (*)    HCT 29.9 (*)    Platelets 121 (*)    All other components within normal limits  HEMOGLOBIN A1C - Abnormal; Notable for the following components:   Hgb A1c MFr Bld 6.9 (*)    All other components within normal limits  GLUCOSE, CAPILLARY - Abnormal; Notable for the following components:   Glucose-Capillary 146 (*)    All other components within normal limits  TYPE AND SCREEN     IMAGES:   EKG: 12/30/2018 Rate 51 bpm Sinus bradycardia  Otherwise normal ECG   CV: Stress  Echo 10/07/2018 FINDINGS: Unexpected: None. Perfusion defects: Inferolateral artifact favored. No reversible perfusion defects.  TID ratio: <1.25. No transient ischemic dilatation on visual analysis. Gated images: End diastolic volume: 045 cc End systolic volume: 40 cc Ejection fraction: 67%. Wall motion abnormalities: None.  CONCLUSION: No inducible ischemia. Normal left ventricular function. Past Medical History:  Diagnosis Date  . Diabetes mellitus without complication ([place])   . History of bleeding ulcers   . History of TIAs   . Hypertension   . Low kidney function     Past Surgical History:  Procedure Laterality Date  . CATARACT EXTRACTION W/PHACO Left 02/15/2014   Procedure: CATARACT EXTRACTION PHACO AND INTRAOCULAR LENS PLACEMENT (IOC);  Surgeon: Tonny Branch, MD;  Location: [facility];  Service: Ophthalmology;  Laterality: Left;  CDE 10.84  . CATARACT EXTRACTION W/PHACO Right 12/14/2013   Procedure: CATARACT EXTRACTION PHACO AND INTRAOCULAR LENS PLACEMENT (IOC);  Surgeon: Tonny Branch, MD;  Location: [facility];  Service: Ophthalmology;  Laterality: Right;  CDE:  16.30  . CHOLECYSTECTOMY  02/2013  . EYE SURGERY    . IR FLUORO GUIDE CV LINE RIGHT  01/05/2019  . IR US GUIDE VASC ACCESS RIGHT  01/05/2019  . PENILE PROSTHESIS IMPLANT    .  TOE AMPUTATION Right 2013   [facility]    MEDICATIONS: . aspirin EC 81 MG tablet  . atorvastatin (LIPITOR) 20 MG tablet  . blood glucose meter kit and supplies KIT  . Blood Glucose Monitoring Suppl (BAYER CONTOUR MONITOR) w/Device KIT  . clonazePAM (KLONOPIN) 0.5 MG tablet  . cloNIDine (CATAPRES) 0.2 MG tablet  . escitalopram (LEXAPRO) 10 MG tablet  . glucose blood (BAYER CONTOUR TEST) test strip  . hydrALAZINE (APRESOLINE) 100 MG tablet  . Insulin Glargine (BASAGLAR KWIKPEN) 100 UNIT/ML SOPN  . Insulin Pen Needle (B-D ULTRAFINE III SHORT PEN) 31G X 8 MM MISC  . Iron-FA-B Cmp-C-Biot-Probiotic (FUSION PLUS) CAPS  . Lancets MISC  .  losartan (COZAAR) 50 MG tablet  . metoprolol succinate (TOPROL-XL) 25 MG 24 hr tablet  . sitaGLIPtin (JANUVIA) 50 MG tablet  . sodium bicarbonate 650 MG tablet   No current facility-administered medications for this encounter.    Marland Kitchen 0.9 %  sodium chloride infusion  . ceFAZolin (ANCEF) 2-4 GM/100ML-% IVPB  . fentaNYL (SUBLIMAZE) 100 MCG/2ML injection  . heparin 1000 UNIT/ML injection  . lidocaine (XYLOCAINE) 1 % (with pres) injection  . midazolam (VERSED) 2 MG/2ML injection   Maia Plan [facility] Pre-Surgical Testing [phone] 01/05/19 12:02 PM

## 2019-01-05 NOTE — Discharge Instructions (Signed)
Central Line Dialysis Access Placement, Care After This sheet gives you information about how to care for yourself after your procedure. Your health care provider may also give you more specific instructions. If you have problems or questions, contact your health care provider. What can I expect after the procedure? After the procedure, it is common to have:  Mild pain or discomfort.  Mild redness, swelling, or bruising around your incision.  A small amount of blood or clear fluid coming from your incision. Follow these instructions at home: Incision care   Follow instructions from your health care provider about how to take care of your incision. Make sure you: ? Wash your hands with soap and water before you change your bandage (dressing). If soap and water are not available, use hand sanitizer. ? Change your dressing as told by your health care provider. ? Leave stitches (sutures) in place.  Check your incision area every day for signs of infection. Check for: ? More redness, swelling, or pain. ? More fluid or blood. ? Warmth. ? Pus or a bad smell.  If directed, put heat on the catheter site as often as told by your health care provider. Use the heat source that your health care provider recommends, such as a moist heat pack or a heating pad. ? Place a towel between your skin and the heat source. ? Leave the heat on for 20-30 minutes. ? Remove the heat if your skin turns bright red. This is especially important if you are unable to feel pain, heat, or cold. You may have a greater risk of getting burned.  If directed, put ice on the catheter site: ? Put ice in a plastic bag. ? Place a towel between your skin and the bag. ? Leave the ice on for 20 minutes, 2-3 times a day. Medicines  Take over-the-counter and prescription medicines only as told by your health care provider.  If you were prescribed an antibiotic medicine, use it as told by your health care provider. Do not stop  using the antibiotic even if you start to feel better. Activity  Return to your normal activities as told by your health care provider. Ask your health care provider what activities are safe for you.  Do not lift anything that is heavier than 10 lb (4.5 kg) until your health care provider says that this is safe. Driving  Do not drive for 24 hours if you were given a medicine to help you relax (sedative) during your procedure.  Do not drive or use heavy machinery while taking prescription pain medicine. Lifestyle  Limit alcohol intake to no more than 1 drink a day for nonpregnant women and 2 drinks a day for men. One drink equals 12 oz of beer, 5 oz of wine, or 1 oz of hard liquor.  Do not use any products that contain nicotine or tobacco, such as cigarettes and e-cigarettes. If you need help quitting, ask your health care provider. General instructions  Do not take baths or showers, swim, or use a hot tub until your health care provider approves. You may only be allowed to take sponge baths for bathing.  Wear compression stockings as told by your health care provider. These stockings help to prevent blood clots and reduce swelling in your legs.  Follow instructions from your health care provider about eating or drinking restrictions.  Keep all follow-up visits as told by your health care provider. This is important. Contact a health care provider if:  Your   catheter gets pulled out of place.  Your catheter site becomes itchy.  You develop a rash around your catheter site.  You have more redness, swelling, or pain around your incision.  You have more fluid or blood coming from your incision.  Your incision area feels warm to the touch.  You have pus or a bad smell coming from your incision.  You have a fever. Get help right away if:  You become light-headed or dizzy.  You faint.  You have difficulty breathing.  Your catheter gets pulled out completely. This  information is not intended to replace advice given to you by your health care provider. Make sure you discuss any questions you have with your health care provider. Document Released: 06/05/2004 Document Revised: 07/16/2016 Document Reviewed: 07/16/2016 Elsevier Interactive Patient Education  2019 [place] Catheter Insertion, Care After Refer to this sheet in the next few weeks. These instructions provide you with information about caring for yourself after your procedure. Your health care provider may also give you more specific instructions. Your treatment has been planned according to current medical practices, but problems sometimes occur. Call your health care provider if you have any problems or questions after your procedure. What can I expect after the procedure? After the procedure, it is common to have:  Some mild redness, swelling, and pain around your catheter site.  A small amount of blood or clear fluid coming from your incisions. Follow these instructions at home: Incision care  Check your incision areas every day for signs of infection. Check for: ? More redness, swelling, or pain. ? More fluid or blood. ? Warmth. ? Pus or a bad smell.  Follow instructions from your health care provider about how to take care of your incisions. Make sure you: ? Wash your hands with soap and water before you change your bandages (dressings). If soap and water are not available, use hand sanitizer. ? Change your dressings as told by your health care provider. Wash the area around your incisions with a germ-killing (antiseptic) solution when you change your dressing, as told by your health care provider. ? Leave stitches (sutures), skin glue, or adhesive strips in place. These skin closures may need to stay in place for 2 weeks or longer. If adhesive strip edges start to loosen and curl up, you may trim the loose edges. Do not remove adhesive strips completely unless your health  care provider tells you to do that. Catheter Care   Wash your hands with soap and water before and after caring for your catheter. If soap and water are not available, use hand sanitizer.  Keep your catheter site and your dressings clean and dry.  Apply an antibiotic ointment to your catheter site as told by your health care provider.  Flush your catheter as told by your health care provider. This helps prevent it from becoming clogged.  Do not open the caps on the ends of the catheter.  Do not pull on your catheter.  If your catheter is in your arm: ? Avoid wearing tight clothes or tight jewelry on your arm that has the catheter. ? Do not sleep with your head on the arm that has the catheter. ? Do not allow your blood pressure to be taken on the arm that has the catheter. ? Do not allow your blood to be drawn from the arm that has the catheter, except through the catheter itself. Medicines  Take over-the-counter and prescription medicines only as told by  your health care provider.  If you were prescribed an antibiotic medicine, take it as told by your health care provider. Do not stop taking the antibiotic even if you start to feel better. Activity  Return to your normal activities as told by your health care provider. Ask your health care provider what activities are safe for you.  Do not lift anything that is heavier than 10 lb (4.5 kg) for 3 weeks or as long as told by your health care provider. Driving  Do not drive until your health care provider approves.  Do not drive or operate heavy machinery while taking prescription pain medicine. General instructions  Follow your health care provider's specific instructions for the type of catheter that you have.  Do not take baths, swim, or use a hot tub until your health care provider approves.  Follow instructions from your health care provider about eating or drinking restrictions.  Wear compression stockings as told by  your health care provider. These stockings help to prevent blood clots and reduce swelling in your legs.  Keep all follow-up visits as told by your health care provider. This is important. Contact a health care provider if:  You have more fluid or blood coming from your incisions.  You have more redness, swelling, or pain at your incisions or around the area where your catheter is inserted.  Your incisions feel warm to the touch.  You feel unusually weak.  You feel nauseous.  Your catheter is not working properly.  You have blood or fluid draining from your catheter.  You are unable to flush your catheter. Get help right away if:  Your catheter breaks.  A hole develops in your catheter.  Your catheter comes loose or gets pulled completely out. If this happens, press on your catheter site firmly with your hand or a clean cloth until you get medical help.  Your catheter becomes blocked.  You have swelling in your arm, shoulder, neck, or face.  You develop chest pain.  You have difficulty breathing.  You feel dizzy or light-headed.  You have pus or a bad smell coming from your incisions.  You have a fever.  You develop bleeding from your catheter or your insertion site, and your bleeding does not stop. This information is not intended to replace advice given to you by your health care provider. Make sure you discuss any questions you have with your health care provider. Document Released: 10/08/2012 Document Revised: 06/24/2016 Document Reviewed: 07/18/2015 Elsevier Interactive Patient Education  [employer].

## 2019-01-07 NOTE — H&P (Signed)
--------------------------------------------------------------------------------   Maxwell Harmon  MRN: [id number]  PRIMARY CARE:  Mel Almond, DO  DOB: 1956/02/20, 63 year old Male  REFERRING:    SSN: [id number]  PROVIDER:  Raynelle Bring, M.D.    LOCATION:  [facility] [phone]   --------------------------------------------------------------------------------   CC/HPI: Right renal neoplasm   Maxwell Harmon is a 63 year old gentleman seen today at the request of Dr. Nedra Hai (General Surgery) for a right renal neoplasm. He is a relative of Maxwell Harmon's wife. In fact, I had operated on his father a few years ago and performed an adrenalectomy for metastatic renal cell isolated to the adrenal gland.   He presents today with a history of chronic kidney disease related to diabetes and hypertension. Recently, his serum creatinine has been approximately 4.0 with a GFR of 17 mL/minutes. He has been followed by his nephrologist, Dr. Loreli Slot, in [place], [place]. Due to his progressive renal dysfunction, he was referred for evaluation at the transplant program at [facility]. During his evaluation, he did undergo imaging including a CT scan of the abdomen and pelvis without contrast. This incidentally demonstrated a 2.3 cm slightly hyperdense mass with partial rim calcification in the upper pole of the right kidney that could not be fully assessed and characterized on this noncontrast imaging. No lymphadenopathy, contralateral renal abnormalities, or other findings that would suggest metastatic disease were noted. Due to his inability to receive IV contrast, he then underwent an MRI without contrast. This was performed on 10/08/2018 and confirmed a 2.0 x 2.0 lesion that was mostly hypointense on T2 imaging with only a small focus of T2 hyper intensity indicating that this mass was not consistent with a renal cyst. 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 He also had a chest x-ray on 08/18/2018 that was negative for pulmonary lesions. Due to his renal lesion, he was referred to the Urology Department at [facility] in underwent an evaluation by Dr. Brendia Sacks. Options were discussed and it was recommended that he strongly consider a radical nephrectomy considering the likelihood that he would be dialysis dependent in the near future regardless.   He has had discussions regarding dialysis access although he has not yet proceeded with a definitive decision. He is most interested in long-term therapy with peritoneal dialysis.   He did undergo a recent cardiac evaluation including a stress echocardiogram indicating preserved left ventricular function with an ejection fraction of over 70% and no indicubile ischemia.   His past medical history is significant for anxiety, type 2 diabetes, hyperlipidemia, hypertension, chronic kidney disease, carotid artery stenosis, and a history of a TIA.   His past surgical history is significant for a laparoscopic cholecystectomy in 2013, toe amputation in 2012 related to diabetes, and an inflatable penile prosthesis in 2006.     ALLERGIES: None   MEDICATIONS: Aspirin 81 mg tablet,chewable  Atorvastatin Calcium  Basaglar Kwikpen U-100 100 unit/ml (3 ml) insulin pen  Clonazepam  Clonidine  Fusion Plus  Hydralazine Hcl  Januvia  Losartan Potassium  Sodium Bicarbonate     GU PSH: None   NON-GU PSH: Cholecystectomy (laparoscopic) Toe amputation, Right, little toe    GU PMH: Kidney Failure Unspec    NON-GU PMH: Acute gastric ulcer with hemorrhage Anxiety Diabetes Type 2 Hypercholesterolemia Hypertension  FAMILY HISTORY: Aneurysm - Mother Kidney Cancer - Father    Notes: 3 daughters   SOCIAL HISTORY: Marital Status:  Married Preferred Language: English; Ethnicity: Not Hispanic Or Latino; Race: White Current Smoking Status: Patient has never smoked.   Tobacco Use Assessment Completed: Used Tobacco in last 30 days? Drinks 1 drink per month. Light Drinker.  Drinks 2 caffeinated drinks per day.    REVIEW OF SYSTEMS:    GU Review Male:   Patient reports get up at night to urinate and stream starts and stops. Patient denies frequent urination, hard to postpone urination, burning/ pain with urination, leakage of urine, trouble starting your streams, and have to strain to urinate .  Gastrointestinal (Lower):   Patient reports constipation. Patient denies diarrhea.  Gastrointestinal (Upper):   Patient reports nausea. Patient denies vomiting.  Constitutional:   Patient reports weight loss and fatigue. Patient denies fever and night sweats.  Skin:   Patient denies skin rash/ lesion and itching.  Eyes:   Patient denies blurred vision and double vision.  Ears/ Nose/ Throat:   Patient denies sore throat and sinus problems.  Hematologic/Lymphatic:   Patient denies swollen glands and easy bruising.  Cardiovascular:   Patient denies leg swelling and chest pains.  Respiratory:   Patient denies cough and shortness of breath.  Endocrine:   Patient reports excessive thirst.   Musculoskeletal:   Patient denies back pain and joint pain.  Neurological:   Patient reports dizziness. Patient denies headaches.  Psychologic:   Patient reports anxiety. Patient denies depression.   VITAL SIGNS:     Weight 200 lb / 90.72 kg  Height 70 in / 177.8 cm  BMI 28.7 kg/m   MULTI-SYSTEM PHYSICAL EXAMINATION:    Constitutional: Well-nourished. No physical deformities. Normally developed. Good grooming.  Neck: Neck symmetrical, not swollen. Normal tracheal position.  Respiratory: No labored breathing, no use of accessory muscles. Clear bilaterally.  Cardiovascular: Normal temperature, normal extremity pulses, no swelling, no  varicosities. Regular rate and rhythm.  Lymphatic: No enlargement of neck, axillae, groin.  Skin: No paleness, no jaundice, no cyanosis. No lesion, no ulcer, no rash.  Neurologic / Psychiatric: Oriented to time, oriented to place, oriented to person. No depression, no anxiety, no agitation.  Gastrointestinal: No mass, no tenderness, no rigidity, non obese abdomen. Well-healed laparoscopic incisions.  Eyes: Normal conjunctivae. Normal eyelids.  Ears, Nose, Mouth, and Throat: Left ear no scars, no lesions, no masses. Right ear no scars, no lesions, no masses. Nose no scars, no lesions, no masses. Normal hearing. Normal lips.  Musculoskeletal: Normal gait and station of head and neck.     PAST DATA REVIEWED:  Source Of History:  Patient  Lab Test Review:   CMP  Records Review:   Previous Patient Records  Urine Test Review:   Urinalysis  X-Ray Review: Chest X-Ray Outside.: Reviewed Films.  C.T. Abdomen/Pelvis: Reviewed Films.  MRI Abdomen: Reviewed Films.    Notes:                     His serum creatinine on 08/18/2018 was 3.74 with an estimated GFR of 16 mL/min. Liver function tests were normal.   PROCEDURES:          Urinalysis w/Scope Dipstick Dipstick Cont'd Micro  Color: Yellow Bilirubin: Neg mg/dL WBC/hpf: NS (Not Seen)  Appearance: Clear Ketones: Neg mg/dL RBC/hpf: 0 - 2/hpf  Specific Gravity: 1.025 Blood: Neg ery/uL Bacteria: NS (Not Seen)  pH: 5.5 Protein: 3+ mg/dL Cystals:  NS (Not Seen)  Glucose: Neg mg/dL Urobilinogen: 0.2 mg/dL Casts: NS (Not Seen)    Nitrites: Neg Trichomonas: Not Present    Leukocyte Esterase: Neg leu/uL Mucous: Not Present      Epithelial Cells: 0 - 5/hpf      Yeast: NS (Not Seen)      Sperm: Not Present    ASSESSMENT:      ICD-10 Details  1 GU:   Right renal neoplasm - D49.511    PLAN:           Schedule Return Visit/Planned Activity: Other See Visit Notes             Note: Will call to schedule surgery          Document Letter(s):   Created for Patient: Clinical Summary         Notes:   1. Right renal neoplasm concerning for possible malignancy: I had a detailed discussion with Mr. Incorvaia and his wife today discussing his complex situation with a small right renal mass in the setting of advanced chronic kidney disease. It appears that dialysis dependent end-stage renal disease is imminent. As such, he would likely be best served with a laparoscopic radical nephrectomy understanding that he likely will become dialysis dependent after that procedure. This will provide definitive treatment if he indeed does have a malignancy. He understands that he would likely need to begin dialysis postoperatively and that he would then need a period of approximately 2 years to remained disease free before a would be a candidate for a renal transplant.   The patient was provided information regarding their renal mass including the relative risk of benign versus malignant pathology and the natural history of renal cell carcinoma and other possible malignancies of the kidney. The role of renal biopsy, laboratory testing, and imaging studies to further characterize renal masses and/or the presence of metastatic disease were explained. We discussed the role of active surveillance, surgical therapy with both radical nephrectomy and nephron-sparing surgery, and ablative therapy in the treatment of renal masses. In addition, we discussed our goals of providing an accurate diagnosis and oncologic control while maintaining optimal renal function as appropriate based on the size, location, and complexity of their renal mass as well as their co-morbidities.   We have discussed the risks of treatment in detail including but not limited to bleeding, infection, heart attack, stroke, death, venothromoboembolism, cancer recurrence, injury/damage to surrounding organs and structures, urine leak, the possibility of open surgical conversion for patients undergoing minimally  invasive surgery, the risk of developing chronic kidney disease and its associated implications, and the potential risk of end stage renal disease possibly necessitating dialysis.   Ultimately, he is most interested in proceeding with a laparoscopic right radical nephrectomy. I will plan to coordinate his care with his nephrologist, Dr. Loreli Slot [phone]) and his transplant coordinator Billy Fischer, RN at [facility].   My recommendation would be for him to proceed with temporary vascular access for hemodialysis postoperatively in preparation for surgery with plans to proceed with postoperative dialysis if necessary. It would then be appropriate for him to transition to peritoneal dialysis. I will coordinate this with his nephrologist. Once this has been accomplished, we will then look at scheduling him appropriately following dialysis access placement.    I have contacted his transplant coordinator and his nephrologist. I've also spoken with Nephrology here in [place]. The current plan is to have a hemodialysis catheter placed per Interventional Radiology preoperatively. He will  then undergo a laparoscopic nephrectomy with plans to be ready to proceed with hemodialysis postoperatively in the hospital. His urologist in [place] as an agreed to take over care for ongoing outpatient dialysis with plans to eventually transition him to peritoneal dialysis. I communicated all this with the patient today. All questions were answered to his stated satisfaction.

## 2019-01-08 ENCOUNTER — Inpatient Hospital Stay (HOSPITAL_COMMUNITY): Payer: Managed Care, Other (non HMO) | Admitting: Anesthesiology

## 2019-01-08 ENCOUNTER — Encounter (HOSPITAL_COMMUNITY): Payer: Self-pay | Admitting: Certified Registered"

## 2019-01-08 ENCOUNTER — Inpatient Hospital Stay (HOSPITAL_COMMUNITY): Payer: Managed Care, Other (non HMO) | Admitting: Physician Assistant

## 2019-01-08 ENCOUNTER — Other Ambulatory Visit: Payer: Self-pay

## 2019-01-08 ENCOUNTER — Encounter (HOSPITAL_COMMUNITY)
Admission: RE | Disposition: A | Discharge status: Home / Self Care | Payer: Self-pay | Source: Home / Self Care | Attending: Urology

## 2019-01-08 ENCOUNTER — Inpatient Hospital Stay (HOSPITAL_COMMUNITY)
Admission: RE | Admit: 2019-01-08 | Discharge: 2019-01-11 | DRG: 657 | Disposition: A | Discharge status: Home / Self Care | Payer: Managed Care, Other (non HMO) | Attending: Urology | Admitting: Urology

## 2019-01-08 DIAGNOSIS — D49511 Neoplasm of unspecified behavior of right kidney: Secondary | ICD-10-CM | POA: Diagnosis present

## 2019-01-08 DIAGNOSIS — N185 Chronic kidney disease, stage 5: Secondary | ICD-10-CM | POA: Diagnosis present

## 2019-01-08 DIAGNOSIS — E1122 Type 2 diabetes mellitus with diabetic chronic kidney disease: Secondary | ICD-10-CM | POA: Diagnosis present

## 2019-01-08 DIAGNOSIS — Z8051 Family history of malignant neoplasm of kidney: Secondary | ICD-10-CM

## 2019-01-08 DIAGNOSIS — Z8673 Personal history of transient ischemic attack (TIA), and cerebral infarction without residual deficits: Secondary | ICD-10-CM | POA: Diagnosis not present

## 2019-01-08 DIAGNOSIS — C641 Malignant neoplasm of right kidney, except renal pelvis: Principal | ICD-10-CM | POA: Diagnosis present

## 2019-01-08 DIAGNOSIS — D631 Anemia in chronic kidney disease: Secondary | ICD-10-CM | POA: Diagnosis present

## 2019-01-08 DIAGNOSIS — R339 Retention of urine, unspecified: Secondary | ICD-10-CM | POA: Diagnosis not present

## 2019-01-08 DIAGNOSIS — I120 Hypertensive chronic kidney disease with stage 5 chronic kidney disease or end stage renal disease: Secondary | ICD-10-CM | POA: Diagnosis present

## 2019-01-08 DIAGNOSIS — Z794 Long term (current) use of insulin: Secondary | ICD-10-CM

## 2019-01-08 DIAGNOSIS — D62 Acute posthemorrhagic anemia: Secondary | ICD-10-CM | POA: Diagnosis not present

## 2019-01-08 DIAGNOSIS — Z79899 Other long term (current) drug therapy: Secondary | ICD-10-CM

## 2019-01-08 DIAGNOSIS — Z7982 Long term (current) use of aspirin: Secondary | ICD-10-CM | POA: Diagnosis not present

## 2019-01-08 DIAGNOSIS — E875 Hyperkalemia: Secondary | ICD-10-CM | POA: Diagnosis present

## 2019-01-08 DIAGNOSIS — N179 Acute kidney failure, unspecified: Secondary | ICD-10-CM | POA: Diagnosis present

## 2019-01-08 HISTORY — PX: LAPAROSCOPIC NEPHRECTOMY: SHX1930

## 2019-01-08 LAB — HEMOGLOBIN AND HEMATOCRIT, BLOOD
HEMATOCRIT: 28.4 % — AB (ref 39.0–52.0)
Hemoglobin: 9.2 g/dL — ABNORMAL LOW (ref 13.0–17.0)

## 2019-01-08 LAB — GLUCOSE, CAPILLARY
GLUCOSE-CAPILLARY: 157 mg/dL — AB (ref 70–99)
Glucose-Capillary: 147 mg/dL — ABNORMAL HIGH (ref 70–99)
Glucose-Capillary: 178 mg/dL — ABNORMAL HIGH (ref 70–99)
Glucose-Capillary: 187 mg/dL — ABNORMAL HIGH (ref 70–99)

## 2019-01-08 LAB — BASIC METABOLIC PANEL
Anion gap: 8 (ref 5–15)
BUN: 76 mg/dL — ABNORMAL HIGH (ref 8–23)
CO2: 18 mmol/L — ABNORMAL LOW (ref 22–32)
Calcium: 8 mg/dL — ABNORMAL LOW (ref 8.9–10.3)
Chloride: 113 mmol/L — ABNORMAL HIGH (ref 98–111)
Creatinine, Ser: 4.72 mg/dL — ABNORMAL HIGH (ref 0.61–1.24)
GFR calc non Af Amer: 12 mL/min — ABNORMAL LOW (ref >60)
GFR, EST AFRICAN AMERICAN: 14 mL/min — AB (ref >60)
Glucose, Bld: 182 mg/dL — ABNORMAL HIGH (ref 70–99)
Potassium: 5.7 mmol/L — ABNORMAL HIGH (ref 3.5–5.1)
Sodium: 139 mmol/L (ref 135–145)

## 2019-01-08 LAB — TYPE AND SCREEN
ABO/RH(D): O NEG
Antibody Screen: NEGATIVE

## 2019-01-08 SURGERY — NEPHRECTOMY, RADICAL, LAPAROSCOPIC, ADULT
Anesthesia: General | Laterality: Right

## 2019-01-08 MED ORDER — DIPHENHYDRAMINE HCL 50 MG/ML IJ SOLN: 12.5000 mg | Freq: Four times a day (QID) | INTRAMUSCULAR | Status: DC | PRN

## 2019-01-08 MED ORDER — MIDAZOLAM HCL 2 MG/2ML IJ SOLN
INTRAMUSCULAR | Status: AC
  Filled 2019-01-08: qty 2

## 2019-01-08 MED ORDER — SODIUM CHLORIDE 0.9 % IV SOLN
INTRAVENOUS | Status: DC | PRN
  Administered 2019-01-08: 11:00:00 via INTRAVENOUS

## 2019-01-08 MED ORDER — PROMETHAZINE HCL 25 MG/ML IJ SOLN: 6.2500 mg | INTRAMUSCULAR | Status: DC | PRN

## 2019-01-08 MED ORDER — SODIUM CHLORIDE (PF) 0.9 % IJ SOLN
INTRAMUSCULAR | Status: AC
  Filled 2019-01-08: qty 50

## 2019-01-08 MED ORDER — EPHEDRINE SULFATE 50 MG/ML IJ SOLN
INTRAMUSCULAR | Status: DC | PRN
  Administered 2019-01-08: 10 mg via INTRAVENOUS
  Administered 2019-01-08 (×3): 5 mg via INTRAVENOUS
  Administered 2019-01-08: 10 mg via INTRAVENOUS
  Administered 2019-01-08 (×4): 5 mg via INTRAVENOUS

## 2019-01-08 MED ORDER — SODIUM CHLORIDE (PF) 0.9 % IJ SOLN
INTRAMUSCULAR | Status: DC | PRN
  Administered 2019-01-08: 20 mL

## 2019-01-08 MED ORDER — METOPROLOL SUCCINATE ER 25 MG PO TB24
25.0000 mg | ORAL_TABLET | Freq: Every day | ORAL | Status: DC
  Administered 2019-01-08 – 2019-01-10 (×3): 25 mg via ORAL
  Filled 2019-01-08 (×3): qty 1

## 2019-01-08 MED ORDER — HYDRALAZINE HCL 50 MG PO TABS
100.0000 mg | ORAL_TABLET | Freq: Three times a day (TID) | ORAL | Status: DC
  Administered 2019-01-08 – 2019-01-11 (×9): 100 mg via ORAL
  Filled 2019-01-08 (×9): qty 2

## 2019-01-08 MED ORDER — PROPOFOL 10 MG/ML IV BOLUS
INTRAVENOUS | Status: DC | PRN
  Administered 2019-01-08 (×2): 100 mg via INTRAVENOUS

## 2019-01-08 MED ORDER — LACTATED RINGERS IR SOLN
Status: DC | PRN
  Administered 2019-01-08: 1000 mL

## 2019-01-08 MED ORDER — ATORVASTATIN CALCIUM 20 MG PO TABS
20.0000 mg | ORAL_TABLET | Freq: Every day | ORAL | Status: DC
  Administered 2019-01-09 – 2019-01-11 (×3): 20 mg via ORAL
  Filled 2019-01-08 (×3): qty 1

## 2019-01-08 MED ORDER — DEXTROSE-NACL 5-0.45 % IV SOLN
INTRAVENOUS | Status: DC
  Administered 2019-01-08: 15:00:00 via INTRAVENOUS

## 2019-01-08 MED ORDER — BUPIVACAINE LIPOSOME 1.3 % IJ SUSP
20.0000 mL | Freq: Once | INTRAMUSCULAR | Status: AC
  Administered 2019-01-08: 20 mL
  Filled 2019-01-08: qty 20

## 2019-01-08 MED ORDER — PHENYLEPHRINE HCL 10 MG/ML IJ SOLN
INTRAMUSCULAR | Status: DC | PRN
  Administered 2019-01-08: 40 ug via INTRAVENOUS
  Administered 2019-01-08 (×2): 80 ug via INTRAVENOUS
  Administered 2019-01-08: 160 ug via INTRAVENOUS

## 2019-01-08 MED ORDER — TRAMADOL HCL 50 MG PO TABS: 50.0000 mg | ORAL_TABLET | Freq: Four times a day (QID) | ORAL | 0 refills | Status: DC | PRN

## 2019-01-08 MED ORDER — HYDROMORPHONE HCL 1 MG/ML IJ SOLN: 0.2500 mg | INTRAMUSCULAR | Status: DC | PRN

## 2019-01-08 MED ORDER — ROCURONIUM BROMIDE 10 MG/ML (PF) SYRINGE
PREFILLED_SYRINGE | INTRAVENOUS | Status: DC | PRN
  Administered 2019-01-08 (×2): 10 mg via INTRAVENOUS
  Administered 2019-01-08: 40 mg via INTRAVENOUS
  Administered 2019-01-08 (×2): 10 mg via INTRAVENOUS

## 2019-01-08 MED ORDER — ACETAMINOPHEN 500 MG PO TABS
1000.0000 mg | ORAL_TABLET | Freq: Once | ORAL | Status: AC
  Administered 2019-01-08: 1000 mg via ORAL
  Filled 2019-01-08: qty 2

## 2019-01-08 MED ORDER — OXYCODONE HCL 5 MG PO TABS: 5.0000 mg | ORAL_TABLET | Freq: Once | ORAL | Status: DC | PRN

## 2019-01-08 MED ORDER — HYDROMORPHONE HCL 1 MG/ML IJ SOLN
0.5000 mg | INTRAMUSCULAR | Status: DC | PRN
  Administered 2019-01-08 – 2019-01-09 (×3): 1 mg via INTRAVENOUS
  Filled 2019-01-08 (×3): qty 1

## 2019-01-08 MED ORDER — CLONAZEPAM 0.5 MG PO TABS: 0.5000 mg | ORAL_TABLET | Freq: Every day | ORAL | Status: DC | PRN

## 2019-01-08 MED ORDER — ONDANSETRON HCL 4 MG/2ML IJ SOLN
INTRAMUSCULAR | Status: DC | PRN
  Administered 2019-01-08 (×2): 4 mg via INTRAVENOUS

## 2019-01-08 MED ORDER — FENTANYL CITRATE (PF) 250 MCG/5ML IJ SOLN
INTRAMUSCULAR | Status: DC | PRN
  Administered 2019-01-08 (×5): 50 ug via INTRAVENOUS

## 2019-01-08 MED ORDER — FENTANYL CITRATE (PF) 100 MCG/2ML IJ SOLN
INTRAMUSCULAR | Status: AC
  Filled 2019-01-08: qty 2

## 2019-01-08 MED ORDER — FENTANYL CITRATE (PF) 250 MCG/5ML IJ SOLN
INTRAMUSCULAR | Status: AC
  Filled 2019-01-08: qty 5

## 2019-01-08 MED ORDER — ONDANSETRON HCL 4 MG/2ML IJ SOLN: 4.0000 mg | INTRAMUSCULAR | Status: DC | PRN

## 2019-01-08 MED ORDER — OXYCODONE HCL 5 MG/5ML PO SOLN: 5.0000 mg | Freq: Once | ORAL | Status: DC | PRN

## 2019-01-08 MED ORDER — SODIUM CHLORIDE 0.9 % IV SOLN
INTRAVENOUS | Status: DC | PRN
  Administered 2019-01-08: 25 ug/min via INTRAVENOUS
  Administered 2019-01-08: 10 ug/min via INTRAVENOUS

## 2019-01-08 MED ORDER — INSULIN ASPART 100 UNIT/ML ~~LOC~~ SOLN
0.0000 [IU] | SUBCUTANEOUS | Status: DC
  Administered 2019-01-08 – 2019-01-09 (×5): 3 [IU] via SUBCUTANEOUS
  Administered 2019-01-09: 5 [IU] via SUBCUTANEOUS
  Administered 2019-01-09: 2 [IU] via SUBCUTANEOUS
  Administered 2019-01-09: 5 [IU] via SUBCUTANEOUS
  Administered 2019-01-10: 2 [IU] via SUBCUTANEOUS
  Administered 2019-01-10: 3 [IU] via SUBCUTANEOUS
  Administered 2019-01-10 – 2019-01-11 (×5): 2 [IU] via SUBCUTANEOUS

## 2019-01-08 MED ORDER — DEXAMETHASONE SODIUM PHOSPHATE 10 MG/ML IJ SOLN
INTRAMUSCULAR | Status: DC | PRN
  Administered 2019-01-08: 10 mg via INTRAVENOUS

## 2019-01-08 MED ORDER — DOCUSATE SODIUM 100 MG PO CAPS
100.0000 mg | ORAL_CAPSULE | Freq: Two times a day (BID) | ORAL | Status: DC
  Administered 2019-01-08 – 2019-01-11 (×6): 100 mg via ORAL
  Filled 2019-01-08 (×6): qty 1

## 2019-01-08 MED ORDER — LIDOCAINE 2% (20 MG/ML) 5 ML SYRINGE
INTRAMUSCULAR | Status: DC | PRN
  Administered 2019-01-08: 60 mg via INTRAVENOUS

## 2019-01-08 MED ORDER — DIPHENHYDRAMINE HCL 12.5 MG/5ML PO ELIX: 12.5000 mg | ORAL_SOLUTION | Freq: Four times a day (QID) | ORAL | Status: DC | PRN

## 2019-01-08 MED ORDER — SODIUM CHLORIDE 0.9 % IV SOLN
INTRAVENOUS | Status: DC
  Administered 2019-01-08: 10:00:00 via INTRAVENOUS

## 2019-01-08 MED ORDER — FENTANYL CITRATE (PF) 100 MCG/2ML IJ SOLN
INTRAMUSCULAR | Status: DC | PRN
  Administered 2019-01-08 (×2): 50 ug via INTRAVENOUS

## 2019-01-08 MED ORDER — CEFAZOLIN SODIUM-DEXTROSE 2-4 GM/100ML-% IV SOLN
2.0000 g | Freq: Once | INTRAVENOUS | Status: AC
  Administered 2019-01-08: 2 g via INTRAVENOUS
  Filled 2019-01-08: qty 100

## 2019-01-08 MED ORDER — HYDRALAZINE HCL 20 MG/ML IJ SOLN: 10.0000 mg | INTRAMUSCULAR | Status: DC | PRN

## 2019-01-08 MED ORDER — SUGAMMADEX SODIUM 200 MG/2ML IV SOLN
INTRAVENOUS | Status: DC | PRN
  Administered 2019-01-08: 200 mg via INTRAVENOUS

## 2019-01-08 MED ORDER — CLONIDINE HCL 0.2 MG PO TABS
0.2000 mg | ORAL_TABLET | Freq: Two times a day (BID) | ORAL | Status: DC
  Administered 2019-01-08 – 2019-01-11 (×6): 0.2 mg via ORAL
  Filled 2019-01-08 (×6): qty 1

## 2019-01-08 MED ORDER — MIDAZOLAM HCL 2 MG/2ML IJ SOLN
INTRAMUSCULAR | Status: DC | PRN
  Administered 2019-01-08 (×2): 1 mg via INTRAVENOUS

## 2019-01-08 MED ORDER — SODIUM ZIRCONIUM CYCLOSILICATE 10 G PO PACK
10.0000 g | PACK | Freq: Three times a day (TID) | ORAL | Status: DC
  Administered 2019-01-08: 10 g via ORAL
  Filled 2019-01-08 (×2): qty 1

## 2019-01-08 MED ORDER — ACETAMINOPHEN 10 MG/ML IV SOLN
1000.0000 mg | Freq: Four times a day (QID) | INTRAVENOUS | Status: DC
  Administered 2019-01-09 (×2): 1000 mg via INTRAVENOUS
  Filled 2019-01-08 (×3): qty 100

## 2019-01-08 MED ORDER — CEFAZOLIN SODIUM-DEXTROSE 1-4 GM/50ML-% IV SOLN
1.0000 g | Freq: Three times a day (TID) | INTRAVENOUS | Status: DC
  Administered 2019-01-09: 1 g via INTRAVENOUS
  Filled 2019-01-08 (×2): qty 50

## 2019-01-08 MED ORDER — LOSARTAN POTASSIUM 50 MG PO TABS: 50.0000 mg | ORAL_TABLET | Freq: Every day | ORAL | Status: DC

## 2019-01-08 MED ORDER — PROPOFOL 10 MG/ML IV BOLUS
INTRAVENOUS | Status: AC
  Filled 2019-01-08: qty 20

## 2019-01-08 SURGICAL SUPPLY — 49 items
ADH SKN CLS APL DERMABOND .7 (GAUZE/BANDAGES/DRESSINGS) ×1
AGENT HMST KT MTR STRL THRMB (HEMOSTASIS)
BAG LAPAROSCOPIC 12 15 PORT 16 (BASKET) ×1 IMPLANT
BAG RETRIEVAL 12/15 (BASKET) ×2
BAG SPEC THK2 15X12 ZIP CLS (MISCELLANEOUS)
BAG ZIPLOCK 12X15 (MISCELLANEOUS) ×1 IMPLANT
BLADE EXTENDED COATED 6.5IN (ELECTRODE) IMPLANT
BLADE SURG SZ10 CARB STEEL (BLADE) IMPLANT
CHLORAPREP W/TINT 26ML (MISCELLANEOUS) ×2 IMPLANT
CLIP VESOLOCK LG 6/CT PURPLE (CLIP) ×3 IMPLANT
CLIP VESOLOCK MED LG 6/CT (CLIP) ×1 IMPLANT
CLIP VESOLOCK XL 6/CT (CLIP) ×4 IMPLANT
COVER SURGICAL LIGHT HANDLE (MISCELLANEOUS) ×2 IMPLANT
COVER WAND RF STERILE (DRAPES) ×1 IMPLANT
CUTTER FLEX LINEAR 45M (STAPLE) ×1 IMPLANT
DERMABOND ADVANCED (GAUZE/BANDAGES/DRESSINGS) ×1
DERMABOND ADVANCED .7 DNX12 (GAUZE/BANDAGES/DRESSINGS) ×1 IMPLANT
DEVICE TROCAR PUNCTURE CLOSURE (ENDOMECHANICALS) ×2 IMPLANT
DRAPE INCISE IOBAN 66X45 STRL (DRAPES) ×2 IMPLANT
DRAPE LAPAROSCOPIC ABDOMINAL (DRAPES) ×1 IMPLANT
DRAPE WARM FLUID 44X44 (DRAPE) IMPLANT
ELECT PENCIL ROCKER SW 15FT (MISCELLANEOUS) ×2 IMPLANT
ELECT REM PT RETURN 15FT ADLT (MISCELLANEOUS) ×2 IMPLANT
GLOVE BIO SURGEON STRL SZ 6.5 (GLOVE) ×2 IMPLANT
GLOVE BIOGEL M STRL SZ7.5 (GLOVE) ×2 IMPLANT
GOWN STRL REUS W/TWL LRG LVL3 (GOWN DISPOSABLE) ×4 IMPLANT
HEMOSTAT SURGICEL 4X8 (HEMOSTASIS) IMPLANT
IRRIG SUCT STRYKERFLOW 2 WTIP (MISCELLANEOUS) ×2
IRRIGATION SUCT STRKRFLW 2 WTP (MISCELLANEOUS) ×1 IMPLANT
KIT BASIN OR (CUSTOM PROCEDURE TRAY) ×2 IMPLANT
RELOAD 45 VASCULAR/THIN (ENDOMECHANICALS) ×2 IMPLANT
RELOAD STAPLE 45 2.5 WHT GRN (ENDOMECHANICALS) IMPLANT
SCISSORS LAP 5X35 DISP (ENDOMECHANICALS) ×2 IMPLANT
SET TUBE SMOKE EVAC HIGH FLOW (TUBING) ×2 IMPLANT
SHEARS HARMONIC ACE PLUS 36CM (ENDOMECHANICALS) ×2 IMPLANT
SLEEVE XCEL OPT CAN 5 100 (ENDOMECHANICALS) ×2 IMPLANT
SURGIFLO W/THROMBIN 8M KIT (HEMOSTASIS) IMPLANT
SUT MNCRL AB 4-0 PS2 18 (SUTURE) ×4 IMPLANT
SUT PDS AB 1 CTX 36 (SUTURE) ×4 IMPLANT
SUT VIC AB 2-0 SH 27 (SUTURE)
SUT VIC AB 2-0 SH 27X BRD (SUTURE) IMPLANT
SUT VICRYL 0 UR6 27IN ABS (SUTURE) ×2 IMPLANT
TOWEL OR 17X26 10 PK STRL BLUE (TOWEL DISPOSABLE) ×2 IMPLANT
TRAY FOLEY MTR SLVR 16FR STAT (SET/KITS/TRAYS/PACK) ×2 IMPLANT
TRAY LAPAROSCOPIC (CUSTOM PROCEDURE TRAY) ×2 IMPLANT
TROCAR BLADELESS OPT 5 100 (ENDOMECHANICALS) ×2 IMPLANT
TROCAR XCEL 12X100 BLDLESS (ENDOMECHANICALS) ×2 IMPLANT
TROCAR XCEL BLUNT TIP 100MML (ENDOMECHANICALS) ×2 IMPLANT
YANKAUER SUCT BULB TIP 10FT TU (MISCELLANEOUS) IMPLANT

## 2019-01-08 NOTE — Anesthesia Procedure Notes (Signed)
Date/Time: 01/08/2019 1:26 PM Performed by: Cynda Familia, CRNA Oxygen Delivery Method: Simple face mask Placement Confirmation: positive ETCO2 and breath sounds checked- equal and bilateral Dental Injury: Teeth and Oropharynx as per pre-operative assessment

## 2019-01-08 NOTE — Consult Note (Signed)
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 year-old w/ hx of DM2, HTN and CKD stage 4/5 f/b nephrology in [place], [place], was undergoing transplant work-up at [facility] and a R renal mass was discovered, likely renal cell Ca.  He is admitted now for R nephrectomy.  In case dialysis would be needed post-op , a tunneled HD catheter was placed by IR at [facility] on 01/05/19, 3 days ago.  He is not yet on dialysis.  We are asked to follow patient post-op and provide RRT if needed.   Patient is post-op and groggy, did not ask him a lot of questions. Denies any home diuretics, SOB or CP.  No recent sig anorexia or wt loss.     ROS  denies CP  no joint pain   no HA  no blurry vision  no rash  no diarrhea  no nausea/ vomiting  no dysuria  no difficulty voiding  no change in urine color    Past Medical History  Past Medical History:  Diagnosis Date  . Diabetes mellitus without complication ([place])   . History of bleeding ulcers   . History of TIAs   . Hypertension   . Low kidney function    Past Surgical History  Past Surgical History:  Procedure Laterality Date  . CATARACT EXTRACTION W/PHACO Left 02/15/2014   Procedure: CATARACT EXTRACTION PHACO AND INTRAOCULAR LENS PLACEMENT (IOC);  Surgeon: Tonny Branch, MD;  Location: [facility];  Service: Ophthalmology;  Laterality: Left;  CDE 10.84  . CATARACT EXTRACTION W/PHACO Right 12/14/2013   Procedure: CATARACT EXTRACTION PHACO AND INTRAOCULAR LENS PLACEMENT (IOC);  Surgeon: Tonny Branch, MD;  Location: [facility];  Service: Ophthalmology;  Laterality: Right;  CDE:  16.30  . CHOLECYSTECTOMY  02/2013  . EYE SURGERY    . IR FLUORO GUIDE CV LINE RIGHT  01/05/2019  . IR US GUIDE VASC ACCESS RIGHT  01/05/2019  . PENILE PROSTHESIS IMPLANT    . TOE AMPUTATION Right 2013   [facility]   Family History  Family History  Problem Relation Age of Onset  . Stroke Mother   . Stroke Father   . Cancer Father   . Cancer Brother    Social History  reports that he has never smoked. He has never used smokeless tobacco. He reports current alcohol use. He reports that he does not use drugs. Allergies No Known Allergies Home medications Prior to Admission medications   Medication Sig Start Date End Date Taking? Authorizing Provider  atorvastatin (LIPITOR) 20 MG tablet Take 20 mg by mouth daily.   Yes [provider]  blood glucose meter kit and supplies KIT Dispense based on patient and insurance preference. Use up to four times daily as directed. (FOR ICD-9 250.00, 250.01). 11/08/15  Yes Nida, Marella Chimes, MD  Blood Glucose Monitoring Suppl (BAYER CONTOUR MONITOR) w/Device KIT 1 each by Does not apply route 4 (four) times daily. Test 4 x daily 11/08/15  Yes Nida, Marella Chimes, MD  clonazePAM (KLONOPIN) 0.5 MG tablet Take 0.5 mg by mouth daily as needed for anxiety.   Yes [provider]  cloNIDine (CATAPRES) 0.2 MG tablet Take 0.2 mg by mouth 2 (two) times daily.   Yes [provider]  glucose blood (BAYER CONTOUR TEST) test strip Use as instructed 4 x  daily 11/08/15  Yes Nida, Marella Chimes, MD  hydrALAZINE (APRESOLINE) 100 MG tablet Take 100 mg by mouth 3 (three) times daily.   Yes [provider]  Iron-FA-B Cmp-C-Biot-Probiotic (FUSION PLUS) CAPS Take 1 tablet by mouth daily. 11/20/18  Yes [provider]  Lancets MISC 1 each by Does not apply route 4 (four) times daily. 11/08/15  Yes Nida, Marella Chimes, MD  losartan (COZAAR) 50 MG tablet Take 50 mg by mouth daily.   Yes [provider]  metoprolol succinate (TOPROL-XL) 25 MG 24 hr tablet Take 25 mg by mouth daily.   Yes [provider]  sitaGLIPtin (JANUVIA) 50 MG tablet Take 50 mg by mouth daily.   Yes [provider]  sodium bicarbonate 650 MG tablet  Take 650 mg by mouth 2 (two) times daily.   Yes [provider]  aspirin EC 81 MG tablet Take 81 mg by mouth daily.    [provider]  escitalopram (LEXAPRO) 10 MG tablet Take 10 mg by mouth daily.    [provider]  Insulin Glargine (BASAGLAR KWIKPEN) 100 UNIT/ML SOPN Inject 30-50 Units into the skin at bedtime.    [provider]  Insulin Pen Needle (B-D ULTRAFINE III SHORT PEN) 31G X 8 MM MISC 1 each by Does not apply route as directed. 09/02/15   Cassandria Anger, MD  traMADol (ULTRAM) 50 MG tablet Take 1-2 tablets (50-100 mg total) by mouth every 6 (six) hours as needed for moderate pain or severe pain. 01/08/19   Debbrah Alar, PA-C   Liver Function Tests No results for input(s): AST, ALT, ALKPHOS, BILITOT, PROT, ALBUMIN in the last 168 hours. No results for input(s): LIPASE, AMYLASE in the last 168 hours. CBC Recent Labs  Lab 01/02/19 1153 01/05/19 0839 01/08/19 1357  WBC 5.4 4.0  --   HGB 9.6* 9.8* 9.2*  HCT 29.9* 29.9* 28.4*  MCV 96.5 92.3  --   PLT 121* 125*  --    Basic Metabolic Panel Recent Labs  Lab 01/02/19 1153 01/08/19 1357  NA 139 139  K 5.0 5.7*  CL 108 113*  CO2 22 18*  GLUCOSE 159* 182*  BUN 76* 76*  CREATININE 5.07* 4.72*  CALCIUM 8.9 8.0*   Iron/TIBC/Ferritin/ %Sat No results found for: IRON, TIBC, FERRITIN, IRONPCTSAT  Vitals:   01/08/19 1339 01/08/19 1345 01/08/19 1400 01/08/19 1430  BP:  (!) 160/69 (!) 164/70 (!) 153/66  Pulse:  79 79 78  Resp: '16 15 13 18  ' Temp: 97.6 F (36.4 C)     TempSrc:      SpO2: 100% 100% 100% 100%  Weight:      Height:       Exam Gen groggy postop, in no distress, calm No rash, cyanosis or gangrene Sclera anicteric, throat clear  No jvd or bruits R IJ TDC in place Chest clear bilat to bases, no rales or wheezing RRR no MRG  Abd soft ntnd no mass or ascites +bs, mult stab wounds from surgery GU normal male MS no joint effusions or deformity Ext no LE or UE edema,  no wounds or ulcers  Neuro is alert, Ox 3 , nf    Home meds:  - aspirin 81 qd/ atorvastatin 20   - metoprolol xl 25 qd/ losartan 50 / hydralazine 100 tid/ clonidine 0.2 bid  - sitagliptin 50 qd/ insulin glargine 30-50 unit hs  - escitalopram 10 qd/ clonazepam 0.68m prn  - sod bicarb 650 bid  Assessment: 1. R renal mass - is now sp R radical lap nephrectomy 01/08/19 2. CKD stage IV - baseline creat 3.5- 4.0.  May need HD after losing R kidney.  3. Hyperkalemia - will give Lokelma tonight, use renal / lowK diet 4. Vol - agree w/ IVF's at 50 /hr, no vol ^ on exam, no diuretics at home 5. Anemia of CKD - Hb 9.2, no need Rx yet 6. HTN - will hold losartan, cont all other home meds 7. DM2 - on lantus 30- 50u daily at home, SSI ordered here  Plan - will follow closely, get K+ down, and if needs dialysis will dialyze    [facility] 01/08/2019, 2:55 PM

## 2019-01-08 NOTE — Anesthesia Postprocedure Evaluation (Signed)
Anesthesia Post Note  Patient: Maxwell Harmon  Procedure(s) Performed: LAPAROSCOPIC RADICAL NEPHRECTOMY (Right )     Patient location during evaluation: PACU Anesthesia Type: General Level of consciousness: awake and alert Pain management: pain level controlled Vital Signs Assessment: post-procedure vital signs reviewed and stable Respiratory status: spontaneous breathing, nonlabored ventilation, respiratory function stable and patient connected to nasal cannula oxygen Cardiovascular status: blood pressure returned to baseline and stable Postop Assessment: no apparent nausea or vomiting Anesthetic complications: no    Last Vitals:  Vitals:   01/08/19 1430 01/08/19 1457  BP: (!) 153/66 (!) 160/73  Pulse: 78 84  Resp: 18 16  Temp:  36.4 C  SpO2: 100% 99%    Last Pain:  Vitals:   01/08/19 1457  TempSrc: Oral  PainSc:                  Sirr Kabel P Bari Handshoe

## 2019-01-08 NOTE — Op Note (Signed)
Preoperative diagnosis: Right renal neoplasm  Postoperative diagnosis: Right renal neoplasm  Procedure: 1.  Right laparoscopic radical nephrectomy  Surgeon: Pryor Curia. M.D.  Assistant(s): Debbrah Alar, PA-C  Anesthesia: General  Complications: None  EBL: 70 mL  IVF:  1000 mL crystalloid  Specimens: 1. Right kidney  Disposition of specimens: Pathology  Indication: Maxwell Harmon is a 63 y.o. patient with a right renal tumor suspicious for malignancy.  After a thorough review of the management options for their renal mass, they elected to proceed with surgical treatment and the above procedure.  We have discussed the potential benefits and risks of the procedure, side effects of the proposed treatment, the likelihood of the patient achieving the goals of the procedure, and any potential problems that might occur during the procedure or recuperation. Informed consent has been obtained.  Description of procedure:  The patient was taken to the operating room and a general anesthetic was administered. The patient was given preoperative antibiotics, placed in the right modified flank position, and prepped and draped in the usual sterile fashion. Next a preoperative timeout was performed.  A site was selected near the umbilicus for placement of the camera port. This was placed using a standard open Hassan technique which allowed entry into the peritoneal cavity under direct vision and without difficulty. A 12 mm Hassan cannula was placed and a pneumoperitoneum established. The camera was then used to inspect the abdomen and there was no evidence of any intra-abdominal injuries or other abnormalities. The remaining abdominal ports were then placed. A 12 mm port was placed in the right lower quadrant and a 5 mm port was placed in the right upper quadrant.  All ports were placed under direct vision without difficulty.  Utilizing the harmonic scalpel, the white line of Toldt was  incised allowing the colon to be mobilized medially and the plane between the mesocolon and the anterior layer of Gerota's fascia to be developed and the kidney exposed.  The ureter and gonadal vein were identified inferiorly and the ureter was lifted anteriorly off the psoas muscle.  Dissection proceeded superiorly along the gonadal vein until the renal vein was identified.  The renal hilum was then carefully isolated with a combination of blunt and sharp dissectiong allowing the renal arterial and venous structures to be separated and isolated.   The renal artery was isolated and ligated with multiple Weck clips and subsequently divided.  The renal vein was then isolated and the 45 mm Flex ETS stapler was placed over the vein and fired.  The stapler only partially fired.  The stapler was quickly flashed open and venous bleeding was noted and the stapler was again clamped with excellent hemostasis.  An additional 5 mm port was placed to assist with an extra instrument and this allowed a 15 mm Weck clip to be placed under the stapler on the inferior vena cava side.  This stapler was then removed and a new stapler and new staple load was placed across the vein and this also misfired.  At this point, the stapler was removed and multiple 15 mm Weck clips were placed on the IVC side for further control and an additional 15 mm clip was placed on the kidney side to control backbleeding.  The vein was sharply divided.    Gerota's fascia was intentionally entered superiorly and the space between the adrenal gland and the kidney was developed.  However, the tissue between the superior pole of the kidney and the  adrenal gland was quite dense and adherent.  This tissue was gradually divided with the harmonic scalpel allowing the adrenal gland to be spared.  The hepatorenal ligaments were divided with the harmonic scalpel.  The lateral and posterior attachements to the kidney were then divided.  The ureter was ligated with  Weck clips and divided allowing the specimen to be freed from all surrounding structures.  The kidney specimen was then placed into a 12 mm retrieval bag.  The renal hilum, liver, adrenal bed and gonadal vein areas were each inspected and hemostasis was ensured with the pneomperitoneal pressures lowered.  The 12 mm lower quadrant port was then closed with a 0-vicryl suture placed laparoscopically to close the fascia of this incision. All remaining ports were removed under direct vision.  The kidney specimen was removed intact within the retrieval bag via the camera port site after this incision was extended slightly. This fascial opening was then closed with two #1 PDS sutures.    All incisions were injected with local anesthetic and reapproximated at the skin with 4-0 monocryl sutures.  Dermabond was applied to the skin. The patient tolerated the procedure well and without complications and was transferred to the recovery unit in satisfactory condition.   Pryor Curia MD

## 2019-01-08 NOTE — Discharge Instructions (Signed)

## 2019-01-08 NOTE — Anesthesia Procedure Notes (Signed)
Procedure Name: Intubation Date/Time: 01/08/2019 10:51 AM Performed by: Cynda Familia, CRNA Pre-anesthesia Checklist: Patient identified, Emergency Drugs available, Suction available and Patient being monitored Patient Re-evaluated:Patient Re-evaluated prior to induction Oxygen Delivery Method: Circle System Utilized Preoxygenation: Pre-oxygenation with 100% oxygen Induction Type: IV induction and Cricoid Pressure applied Ventilation: Mask ventilation without difficulty Laryngoscope Size: Miller and 2 Grade View: Grade II Tube type: Oral Number of attempts: 1 Airway Equipment and Method: Stylet Placement Confirmation: ETT inserted through vocal cords under direct vision,  positive ETCO2 and breath sounds checked- equal and bilateral Secured at: 22 cm Tube secured with: Tape Dental Injury: Teeth and Oropharynx as per pre-operative assessment  Comments: Smooth IV induction Ellender-- intubation AM CRNA atraumatic-- teeth and mouth as preop-- pt sts that he has had difficulty with a ETT placed in the past that resulted in a chipped front tooth-- he is anterior with limited mouth opening-- consider Glidescope intubation in future--   Teeth and mouth unchanged with laryngoscopy-- dental advisory given by Ellender in holding-- bilat BS Ellender.

## 2019-01-08 NOTE — Transfer of Care (Signed)
Immediate Anesthesia Transfer of Care Note  Patient: Maxwell Harmon  Procedure(s) Performed: LAPAROSCOPIC RADICAL NEPHRECTOMY (Right )  Patient Location: PACU  Anesthesia Type:General  Level of Consciousness: sedated  Airway & Oxygen Therapy: Patient Spontanous Breathing and Patient connected to face mask oxygen  Post-op Assessment: Report given to RN and Post -op Vital signs reviewed and stable  Post vital signs: Reviewed and stable  Last Vitals:  Vitals Value Taken Time  BP    Temp    Pulse 77 01/08/2019  1:39 PM  Resp    SpO2 100 % 01/08/2019  1:39 PM  Vitals shown include unvalidated device data.  Last Pain:  Vitals:   01/08/19 0951  TempSrc:   PainSc: 0-No pain      Patients Stated Pain Goal: 4 (50/87/19 9412)  Complications: No apparent anesthesia complications

## 2019-01-08 NOTE — Progress Notes (Signed)
Patient ID: Maxwell Harmon, male   DOB: [birth year], 63 y.o.   MRN: [id number]  Post-op note  Subjective: The patient is doing well.  No complaints.  Objective: Vital signs in last 24 hours: Temp:  [97.6 F (36.4 C)] 97.6 F (36.4 C) (03/05 1457) Pulse Rate:  [65-84] 84 (03/05 1457) Resp:  [13-18] 16 (03/05 1457) BP: (137-164)/(66-73) 160/73 (03/05 1457) SpO2:  [99 %-100 %] 99 % (03/05 1457) Weight:  [89.4 kg] 89.4 kg (03/05 0951)  Intake/Output from previous day: No intake/output data recorded. Intake/Output this shift: Total I/O In: 1350 [I.V.:1350] Out: 840 [Urine:770; Blood:70]  Physical Exam:  General: Alert and oriented. Abdomen: Soft, Nondistended. Incisions: Clean and dry. GU: Urine clear.  Lab Results: Recent Labs    01/08/19 1357  HGB 9.2*  HCT 28.4*   Lab Results  Component Value Date   CREATININE 4.72 (H) 01/08/2019   BUN 76 (H) 01/08/2019   NA 139 01/08/2019   K 5.7 (H) 01/08/2019   CL 113 (H) 01/08/2019   CO2 18 (L) 01/08/2019     Assessment/Plan: POD#0   1) Continue to monitor, ambulate, IS, Appreciate nephrology input for possible need for dialysis postoperatively, labs ordered for morning.  Maxwell Harmon, Maxwell Harmon. Maxwell Harmon   LOS: 0 days   Maxwell Harmon 01/08/2019, 3:22 PM

## 2019-01-09 ENCOUNTER — Encounter (HOSPITAL_COMMUNITY): Payer: Self-pay | Admitting: Urology

## 2019-01-09 LAB — GLUCOSE, CAPILLARY
Glucose-Capillary: 143 mg/dL — ABNORMAL HIGH (ref 70–99)
Glucose-Capillary: 152 mg/dL — ABNORMAL HIGH (ref 70–99)
Glucose-Capillary: 175 mg/dL — ABNORMAL HIGH (ref 70–99)
Glucose-Capillary: 189 mg/dL — ABNORMAL HIGH (ref 70–99)
Glucose-Capillary: 203 mg/dL — ABNORMAL HIGH (ref 70–99)
Glucose-Capillary: 213 mg/dL — ABNORMAL HIGH (ref 70–99)

## 2019-01-09 LAB — BASIC METABOLIC PANEL
Anion gap: 9 (ref 5–15)
BUN: 85 mg/dL — ABNORMAL HIGH (ref 8–23)
CO2: 18 mmol/L — ABNORMAL LOW (ref 22–32)
Calcium: 8.1 mg/dL — ABNORMAL LOW (ref 8.9–10.3)
Chloride: 110 mmol/L (ref 98–111)
Creatinine, Ser: 5.54 mg/dL — ABNORMAL HIGH (ref 0.61–1.24)
GFR calc non Af Amer: 10 mL/min — ABNORMAL LOW (ref >60)
GFR, EST AFRICAN AMERICAN: 12 mL/min — AB (ref >60)
Glucose, Bld: 169 mg/dL — ABNORMAL HIGH (ref 70–99)
POTASSIUM: 4.9 mmol/L (ref 3.5–5.1)
Sodium: 137 mmol/L (ref 135–145)

## 2019-01-09 LAB — HEMOGLOBIN AND HEMATOCRIT, BLOOD
HEMATOCRIT: 26.1 % — AB (ref 39.0–52.0)
Hemoglobin: 8.1 g/dL — ABNORMAL LOW (ref 13.0–17.0)

## 2019-01-09 MED ORDER — LIDOCAINE HCL URETHRAL/MUCOSAL 2 % EX GEL
1.0000 "application " | Freq: Once | CUTANEOUS | Status: AC
  Administered 2019-01-10: 1 via URETHRAL
  Filled 2019-01-09: qty 5

## 2019-01-09 MED ORDER — HYDROCODONE-ACETAMINOPHEN 5-325 MG PO TABS: 1.0000 | ORAL_TABLET | Freq: Four times a day (QID) | ORAL | 0 refills | Status: DC | PRN

## 2019-01-09 MED ORDER — SODIUM CHLORIDE 0.45 % IV SOLN
INTRAVENOUS | Status: DC
  Administered 2019-01-09 – 2019-01-10 (×3): via INTRAVENOUS

## 2019-01-09 MED ORDER — SODIUM ZIRCONIUM CYCLOSILICATE 10 G PO PACK
10.0000 g | PACK | Freq: Every day | ORAL | Status: DC
  Administered 2019-01-09: 10 g via ORAL
  Filled 2019-01-09 (×2): qty 1

## 2019-01-09 MED ORDER — HYDROCODONE-ACETAMINOPHEN 5-325 MG PO TABS
1.0000 | ORAL_TABLET | Freq: Four times a day (QID) | ORAL | Status: DC | PRN
  Administered 2019-01-09: 1 via ORAL
  Administered 2019-01-09 – 2019-01-11 (×6): 2 via ORAL
  Filled 2019-01-09 (×6): qty 2
  Filled 2019-01-09: qty 1

## 2019-01-09 NOTE — Progress Notes (Signed)
[facility] Progress Note  Subjective: very good UOP yesterday, creat up some to 5.5 today, no uremic symptoms  Vitals:   01/09/19 0218 01/09/19 0417 01/09/19 0421 01/09/19 0908  BP: (!) 147/63  137/63 (!) 143/60  Pulse: 88  82 77  Resp: 17  18 18   Temp: 97.6 F (36.4 C)  98.1 F (36.7 C) 98 F (36.7 C)  TempSrc: Oral  Oral Oral  SpO2: 98%  98% 98%  Weight:  88.1 kg    Height:        Inpatient medications: . atorvastatin  20 mg Oral Daily  . cloNIDine  0.2 mg Oral BID  . docusate sodium  100 mg Oral BID  . hydrALAZINE  100 mg Oral TID  . insulin aspart  0-15 Units Subcutaneous Q4H  . metoprolol succinate  25 mg Oral QHS    clonazePAM, diphenhydrAMINE **OR** diphenhydrAMINE, hydrALAZINE, HYDROcodone-acetaminophen, ondansetron  Iron/TIBC/Ferritin/ %Sat No results found for: IRON, TIBC, FERRITIN, IRONPCTSAT  Exam: Gen in no distress, calm No jvd or bruits R IJ TDC in place Chest clear bilat to bases, no rales or wheezing RRR no MRG  Abd soft ntnd no mass or ascites +bs, mult stab wounds from surgery GU normal male MS no joint effusions or deformity Ext no LE or UE edema, no wounds or ulcers  Neuro is alert, Ox 3 , nf    Home meds:  - aspirin 81 qd/ atorvastatin 20   - metoprolol xl 25 qd/ losartan 50 / hydralazine 100 tid/ clonidine 0.2 bid  - sitagliptin 50 qd/ insulin glargine 30-50 unit hs  - escitalopram 10 qd/ clonazepam 0.5mg  prn  - sod bicarb 650 bid   Assessment: 1. 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 CKD stage IV - baseline creat 3.5- 4.0. Creat up as expected to 5.5 from 4.7 post R nephrectomy.  Good UOP, will cont to observe. No indication for HD yet.  3. Hyperkalemia - better after Lokelma, renal diet.  Cont both. 4. Vol - no vol ^ on exam, no diuretics at home, will resume IVF's 5. Anemia of CKD - Hb 8's, no need Rx yet 6. HTN - cont clonidine/ hydralazine/ metoprolol; holding ARB for now 7. DM2 - home insulin  on hold, BS's good, getting SSI here     [facility] 01/09/2019, 1:36 PM  Recent Labs  Lab 01/05/19 0839 01/08/19 1357 01/09/19 0534  NA  --  139 137  K  --  5.7* 4.9  CL  --  113* 110  CO2  --  18* 18*  GLUCOSE  --  182* 169*  BUN  --  76* 85*  CREATININE  --  4.72* 5.54*  CALCIUM  --  8.0* 8.1*  INR 1.0  --   --    No results for input(s): AST, ALT, ALKPHOS, BILITOT, PROT in the last 168 hours. Recent Labs  Lab 01/05/19 0839 01/08/19 1357 01/09/19 0534  WBC 4.0  --   --   HGB 9.8* 9.2* 8.1*  HCT 29.9* 28.4* 26.1*  MCV 92.3  --   --   PLT 125*  --   --

## 2019-01-09 NOTE — Plan of Care (Addendum)
Pt has had good OP this shift. Pt's prescribed pain meds have been adequate this shift. Pt ambulated once in his room for 20 ft and once in the hallway for 200 ft. He tolerated both well.

## 2019-01-09 NOTE — Discharge Summary (Addendum)
Date of admission: 01/08/2019  Date of discharge: 01/11/2019  Admission diagnosis: Right renal neoplasm  Discharge diagnosis: Right renal neoplasm  Secondary diagnoses: CKD, hypertension, diabetes  History and Physical: For full details, please see admission history and physical. Briefly, Maxwell Harmon is a 63 y.o. year old patient with a right renal neoplasm noted on his evaluation for a renal transplant.   Hospital Course: He underwent a right laparoscopic radical nephrectomy on 01/08/19.  A hemodialysis catheter was placed preoperatively anticipating the need for dialysis.  His postoperative course was uncomplicated.  His diet was advanced and he was able to ambulate without difficulty.  On POD#1, there was no indication for dialysis. Nephrology was helpful in following his clinical course. His creatinine continued to rise to ~6.2, at which point is plateaued on POD3. Of note, he did have post-operative urinary retention for which an indwelling urethral catheter was placed on POD2. He had two episodes of 300-600cc retention. By POD3 he was doing well, tolerating regular diet, ambulating. His daily UOP was ~2.5L. He was felt safe for discharge at this point.   He will follow up with his nephrologist on Wednesday of this week.  We will request a follow up/TOV appointment in our clinic on Tuesday of this week. This was the best time based on patient's schedule as well  Laboratory values:  Recent Labs    01/09/19 0534 01/10/19 0507 01/11/19 0523  HGB 8.1* 7.6* 7.4*  HCT 26.1* 24.3* 23.5*   Recent Labs    01/10/19 1835 01/11/19 0523  CREATININE 6.32* 6.22*    Disposition: Home  Discharge instruction: The patient was instructed to be ambulatory but told to refrain from heavy lifting, strenuous activity, or driving.  Discharge medications:  Allergies as of 01/09/2019   No Known Allergies     Medication List    STOP taking these medications   aspirin EC 81 MG tablet     TAKE  these medications   atorvastatin 20 MG tablet Commonly known as:  LIPITOR Take 20 mg by mouth daily.   Basaglar KwikPen 100 UNIT/ML Sopn Inject 30-50 Units into the skin at bedtime.   Bayer Contour Monitor w/Device Kit 1 each by Does not apply route 4 (four) times daily. Test 4 x daily   blood glucose meter kit and supplies Kit Dispense based on patient and insurance preference. Use up to four times daily as directed. (FOR ICD-9 250.00, 250.01).   clonazePAM 0.5 MG tablet Commonly known as:  KLONOPIN Take 0.5 mg by mouth daily as needed for anxiety.   cloNIDine 0.2 MG tablet Commonly known as:  CATAPRES Take 0.2 mg by mouth 2 (two) times daily.   escitalopram 10 MG tablet Commonly known as:  LEXAPRO Take 10 mg by mouth daily.   Fusion Plus Caps Take 1 tablet by mouth daily.   glucose blood test strip Commonly known as:  Estate manager/land agent Use as instructed 4 x daily   hydrALAZINE 100 MG tablet Commonly known as:  APRESOLINE Take 100 mg by mouth 3 (three) times daily.   HYDROcodone-acetaminophen 5-325 MG tablet Commonly known as:  NORCO/VICODIN Take 1-2 tablets by mouth every 6 (six) hours as needed.   Insulin Pen Needle 31G X 8 MM Misc Commonly known as:  B-D ULTRAFINE III SHORT PEN 1 each by Does not apply route as directed.   Lancets Misc 1 each by Does not apply route 4 (four) times daily.      metoprolol succinate 25 MG  24 hr tablet Commonly known as:  TOPROL-XL Take 25 mg by mouth daily.   sitaGLIPtin 50 MG tablet Commonly known as:  JANUVIA Take 50 mg by mouth daily.   sodium bicarbonate 650 MG tablet Take 650 mg by mouth 2 (two) times daily.       Followup:  Follow-up Information    Raynelle Bring, MD On 01/28/2019.   Specialty:  Urology Why:  at 12:30 Contact information: [address] [phone]

## 2019-01-09 NOTE — Progress Notes (Signed)
Pt due to void at 1820. Bladder scan showed 409 cc's. Pt states he has slight urge to void but is unable. Dr. Alinda Money paged and made aware. Gave orders to wait two more hours to see if patient could void on his own. Pt then called stating he felt the urge to void. Was able to void 125 cc's. Denies feeling distended or full bladder. Will continue to monitor.

## 2019-01-09 NOTE — Progress Notes (Signed)
Patient ID: Maxwell Harmon, male   DOB: Nov 30, 1955, 63 y.o.   MRN: [id number]  Doing well.  Tolerating diet.  Passing flatus.  No need for HD yet.  UOP lower today.  IVF restarted per nephrology.  Plan:  - Labs pending for tomorrow morning.  Will rely on nephrology to determine if he can be discharged with outpatient follow up or if he should stay for continued close monitoring/dialysis treatment.

## 2019-01-09 NOTE — Progress Notes (Addendum)
Patient ID: Maxwell Harmon, male   DOB: 1956/01/09, 63 y.o.   MRN: [id number]  1 Day Post-Op Subjective: Pt doing well.  Ambulated without difficulty.  Tolerating liquids.  Pain controlled.  UOP good overnight.  Objective: Vital signs in last 24 hours: Temp:  [97.6 F (36.4 C)-98.1 F (36.7 C)] 98.1 F (36.7 C) (03/06 0421) Pulse Rate:  [65-94] 82 (03/06 0421) Resp:  [13-18] 18 (03/06 0421) BP: (137-170)/(63-78) 137/63 (03/06 0421) SpO2:  [98 %-100 %] 98 % (03/06 0421) Weight:  [88.1 kg-89.4 kg] 88.1 kg (03/06 0417)  Intake/Output from previous day: 03/05 0701 - 03/06 0700 In: 2267.3 [P.O.:360; I.V.:1807.3; IV Piggyback:100] Out: 1340 [Urine:1220; Emesis/NG output:50; Blood:70] Intake/Output this shift: No intake/output data recorded.  Physical Exam:  General: Alert and oriented CV: RRR Lungs: Clear Abdomen: Soft, ND, minimal BS Incisions: C/D/I Ext: NT, No erythema  Lab Results: Recent Labs    01/08/19 1357 01/09/19 0534  HGB 9.2* 8.1*  HCT 28.4* 26.1*   BMET Recent Labs    01/08/19 1357 01/09/19 0534  NA 139 137  K 5.7* 4.9  CL 113* 110  CO2 18* 18*  GLUCOSE 182* 169*  BUN 76* 85*  CREATININE 4.72* 5.54*  CALCIUM 8.0* 8.1*     Studies/Results: Path pending.  Assessment/Plan: POD # 1 s/p right laparoscopic radical nephrectomy - Ambulate, IS, DVT prophylaxis - Advance to renal diet - D/C catheter - Oral pain medication - Anemia likely hemodilution and CKD related.  Do not suspect bleeding but will recheck H/H tomorrow. - Continue to follow renal function/electrolytes. Potassium better after treatment yesterday.  Appreciate nephrology input and care.  Unlikely to have acute need for dialysis today based on labs and good UOP still with solitary kidney, but will await nephrology guidance and plan.  Regardless, I assume it would be best to monitor closely in the hospital over the weekend to determine need for dialysis dependence at this point with plans  to continue outpatient nephrology care in [place] with Dr. Prescott Gum early next week.    LOS: 1 day   Dutch Gray 01/09/2019, 7:32 AM

## 2019-01-10 LAB — BASIC METABOLIC PANEL
Anion gap: 9 (ref 5–15)
BUN: 75 mg/dL — ABNORMAL HIGH (ref 8–23)
CO2: 17 mmol/L — AB (ref 22–32)
Calcium: 7.9 mg/dL — ABNORMAL LOW (ref 8.9–10.3)
Chloride: 108 mmol/L (ref 98–111)
Creatinine, Ser: 6.1 mg/dL — ABNORMAL HIGH (ref 0.61–1.24)
GFR calc Af Amer: 11 mL/min — ABNORMAL LOW (ref >60)
GFR calc non Af Amer: 9 mL/min — ABNORMAL LOW (ref >60)
Glucose, Bld: 149 mg/dL — ABNORMAL HIGH (ref 70–99)
Potassium: 5 mmol/L (ref 3.5–5.1)
Sodium: 134 mmol/L — ABNORMAL LOW (ref 135–145)

## 2019-01-10 LAB — CREATININE, SERUM
Creatinine, Ser: 6.32 mg/dL — ABNORMAL HIGH (ref 0.61–1.24)
GFR calc non Af Amer: 9 mL/min — ABNORMAL LOW (ref >60)
GFR, EST AFRICAN AMERICAN: 10 mL/min — AB (ref >60)

## 2019-01-10 LAB — GLUCOSE, CAPILLARY
Glucose-Capillary: 150 mg/dL — ABNORMAL HIGH (ref 70–99)
Glucose-Capillary: 122 mg/dL — ABNORMAL HIGH (ref 70–99)
Glucose-Capillary: 134 mg/dL — ABNORMAL HIGH (ref 70–99)
Glucose-Capillary: 148 mg/dL — ABNORMAL HIGH (ref 70–99)
Glucose-Capillary: 168 mg/dL — ABNORMAL HIGH (ref 70–99)

## 2019-01-10 LAB — HEMOGLOBIN AND HEMATOCRIT, BLOOD
HCT: 24.3 % — ABNORMAL LOW (ref 39.0–52.0)
Hemoglobin: 7.6 g/dL — ABNORMAL LOW (ref 13.0–17.0)

## 2019-01-10 MED ORDER — HEPARIN SODIUM (PORCINE) 5000 UNIT/ML IJ SOLN
5000.0000 [IU] | Freq: Three times a day (TID) | INTRAMUSCULAR | Status: DC
  Administered 2019-01-10 – 2019-01-11 (×3): 5000 [IU] via SUBCUTANEOUS
  Filled 2019-01-10 (×3): qty 1

## 2019-01-10 MED ORDER — SODIUM ZIRCONIUM CYCLOSILICATE 10 G PO PACK
10.0000 g | PACK | Freq: Every day | ORAL | Status: DC
  Administered 2019-01-10: 10 g via ORAL
  Filled 2019-01-10 (×2): qty 1

## 2019-01-10 MED ORDER — LIDOCAINE HCL URETHRAL/MUCOSAL 2 % EX GEL
1.0000 "application " | Freq: Once | CUTANEOUS | Status: AC
  Administered 2019-01-10: 1 via URETHRAL
  Filled 2019-01-10: qty 11

## 2019-01-10 MED ORDER — TAMSULOSIN HCL 0.4 MG PO CAPS
0.4000 mg | ORAL_CAPSULE | Freq: Every day | ORAL | Status: DC
  Administered 2019-01-10: 0.4 mg via ORAL
  Filled 2019-01-10: qty 1

## 2019-01-10 NOTE — Progress Notes (Signed)
Initial output after f/c insertions = 526mL of pale yellow urine.  Barbee Shropshire. Brigitte Pulse, RN

## 2019-01-10 NOTE — Progress Notes (Addendum)
Patient ID: Maxwell Harmon, male   DOB: 1956/04/14, 63 y.o.   MRN: [id number]  2 Days Post-Op Subjective: Pt doing well. Chiefly bothered by urinary retention. Required I&Ox1 overnight for 600cc retention. Uncomfortable again this morning with 600cc on bladder scan. Tolerating regular diet, ambulating. No flatus yet.  Hgb 7.7 from 8.1 Cr 6.10 from 5.54  Objective: Vital signs in last 24 hours: Temp:  [97.8 F (36.6 C)-98.9 F (37.2 C)] 97.8 F (36.6 C) (03/07 0642) Pulse Rate:  [68-79] 76 (03/07 0642) Resp:  [14-18] 16 (03/07 0642) BP: (130-183)/(57-85) 159/66 (03/07 0642) SpO2:  [97 %-98 %] 98 % (03/07 0642) Weight:  [90 kg] 90 kg (03/07 0500)  Intake/Output from previous day: 03/06 0701 - 03/07 0700 In: 718.1 [P.O.:555; I.V.:163.1] Out: 1705 [Urine:1705] Intake/Output this shift: Total I/O In: -  Out: 50 [Urine:50]  Physical Exam:  General: Alert and oriented CV: RRR Lungs: Clear Abdomen: Soft, ND, minimal BS Incisions: C/D/I Ext: NT, No erythema  Lab Results: Recent Labs    01/08/19 1357 01/09/19 0534 01/10/19 0507  HGB 9.2* 8.1* 7.6*  HCT 28.4* 26.1* 24.3*   BMET Recent Labs    01/09/19 0534 01/10/19 0507  NA 137 134*  K 4.9 5.0  CL 110 108  CO2 18* 17*  GLUCOSE 169* 149*  BUN 85* 75*  CREATININE 5.54* 6.10*  CALCIUM 8.1* 7.9*     Studies/Results: Path pending.  Assessment/Plan: POD # 2 s/p right laparoscopic radical nephrectomy - Ambulate, IS, DVT prophylaxis - Advance to renal diet - Replace indwelling catheter for retention. Use 75F coude with lidocaine urojet - Oral pain medication - Anemia likely hemodilution and CKD related.  Do not suspect bleeding but will recheck H/H tomorrow. - Heparin ppx - Spoke to Dr. Jonnie Finner, nephrology, who is following close. We will plan to keep in-house today and continue to monitor renal function. Would like to see creatinine plateau    LOS: 2 days   Fredricka Bonine 01/10/2019, 10:00 AM

## 2019-01-10 NOTE — Plan of Care (Signed)

## 2019-01-10 NOTE — Progress Notes (Signed)
Patient voided 68mL of urine. Bladder scanner revealed PVR of 633. Patient denies discomfort and doesn't feel urge to void. Will update MD on rounds.  Barbee Shropshire. Brigitte Pulse, RN

## 2019-01-10 NOTE — Progress Notes (Signed)
RN unable to insert 18 coude cath (tip up). Full syringe of lidocaine gel used prior to insertion of foley. Second RN also unable to insert foley. MD notified. Barbee Shropshire. Brigitte Pulse, RN

## 2019-01-10 NOTE — Progress Notes (Signed)
At 2150 patient attempted to void but was unable, felt like his bladder was full. Bladder scan at this time showed 401 cc's. Urology on call provider paged. Order given to I/O cath x1. This Probation officer and second RN attempted to insert I/O cath, unsuccessful attempts. Re-paged on call urology provider to inform of unsuccessful attempts. Order given to attempt I/O cath with lidocaine jelly and 73F coude catheter. Belma RN, successfully inserted 73F coude catheter. 600 cc's returned. Will continue to monitor.

## 2019-01-10 NOTE — Progress Notes (Signed)
[facility] Progress Note  Subjective: required coude cath for urinary retention overnight. Creat up today 6.10, no N/V, eating solid foods. Good uop.   Vitals:   01/10/19 0346 01/10/19 0500 01/10/19 0642 01/10/19 1501  BP: (!) 154/72  (!) 159/66 (!) 157/66  Pulse: 79  76 72  Resp: 16  16 15   Temp: 98.1 F (36.7 C)  97.8 F (36.6 C) 97.9 F (36.6 C)  TempSrc: Oral  Oral Oral  SpO2: 97%  98% 98%  Weight:  90 kg    Height:        Inpatient medications: . atorvastatin  20 mg Oral Daily  . cloNIDine  0.2 mg Oral BID  . docusate sodium  100 mg Oral BID  . heparin  5,000 Units Subcutaneous Q8H  . hydrALAZINE  100 mg Oral TID  . insulin aspart  0-15 Units Subcutaneous Q4H  . metoprolol succinate  25 mg Oral QHS  . sodium zirconium cyclosilicate  10 g Oral Daily  . tamsulosin  0.4 mg Oral QHS   . sodium chloride 65 mL/hr at 01/10/19 0448   clonazePAM, diphenhydrAMINE **OR** diphenhydrAMINE, hydrALAZINE, HYDROcodone-acetaminophen, ondansetron  Iron/TIBC/Ferritin/ %Sat No results found for: IRON, TIBC, FERRITIN, IRONPCTSAT  Exam: Gen in no distress, calm No jvd or bruits R IJ TDC in place Chest clear bilat to bases, no rales or wheezing RRR no MRG  Abd soft ntnd no mass or ascites +bs, mult stab wounds from surgery GU normal male MS no joint effusions or deformity Ext no LE or UE edema, no wounds or ulcers  Neuro is alert, Ox 3 , nf    Home meds:  - aspirin 81 qd/ atorvastatin 20   - metoprolol xl 25 qd/ losartan 50 / hydralazine 100 tid/ clonidine 0.2 bid  - sitagliptin 50 qd/ insulin glargine 30-50 unit hs  - escitalopram 10 qd/ clonazepam 0.5mg  prn  - sod bicarb 650 bid   Assessment: 1. AKI on CKD stage IV - baseline creat 3.5- 4.0, now is sp R nephrectomy. Creat up 6.1 today. No uremic signs. Cont to follow labs. Hopefully will stabilize soon. Has TDC if HD needed.  2. 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 Hyperkalemia - better  after Lokelma, renal diet.  Cont both.  4. Vol - no vol ^ on exam, no diuretics at home. Cont IVF.  5. Anemia of CKD - Hb 7.6, watch 6. HTN - cont clonidine/ hydralazine/ metoprolol; holding ARB for now 7. DM2 - home insulin on hold, BS's good, getting SSI here     [facility] 01/10/2019, 3:24 PM  Recent Labs  Lab 01/05/19 0839  01/09/19 0534 01/10/19 0507  NA  --    < > 137 134*  K  --    < > 4.9 5.0  CL  --    < > 110 108  CO2  --    < > 18* 17*  GLUCOSE  --    < > 169* 149*  BUN  --    < > 85* 75*  CREATININE  --    < > 5.54* 6.10*  CALCIUM  --    < > 8.1* 7.9*  INR 1.0  --   --   --    < > = values in this interval not displayed.   No results for input(s): AST, ALT, ALKPHOS, BILITOT, PROT in the last 168 hours. Recent Labs  Lab 01/05/19 [phone]  01/09/19  0534 01/10/19 0507  WBC 4.0  --   --   --   HGB 9.8*   < > 8.1* 7.6*  HCT 29.9*   < > 26.1* 24.3*  MCV 92.3  --   --   --   PLT 125*  --   --   --    < > = values in this interval not displayed.

## 2019-01-11 LAB — BASIC METABOLIC PANEL
ANION GAP: 9 (ref 5–15)
BUN: 78 mg/dL — ABNORMAL HIGH (ref 8–23)
CO2: 18 mmol/L — ABNORMAL LOW (ref 22–32)
Calcium: 7.9 mg/dL — ABNORMAL LOW (ref 8.9–10.3)
Chloride: 106 mmol/L (ref 98–111)
Creatinine, Ser: 6.22 mg/dL — ABNORMAL HIGH (ref 0.61–1.24)
GFR calc Af Amer: 10 mL/min — ABNORMAL LOW (ref >60)
GFR calc non Af Amer: 9 mL/min — ABNORMAL LOW (ref >60)
GLUCOSE: 129 mg/dL — AB (ref 70–99)
Potassium: 4.9 mmol/L (ref 3.5–5.1)
Sodium: 133 mmol/L — ABNORMAL LOW (ref 135–145)

## 2019-01-11 LAB — CBC
HCT: 23.5 % — ABNORMAL LOW (ref 39.0–52.0)
Hemoglobin: 7.4 g/dL — ABNORMAL LOW (ref 13.0–17.0)
MCH: 30.5 pg (ref 26.0–34.0)
MCHC: 31.5 g/dL (ref 30.0–36.0)
MCV: 96.7 fL (ref 80.0–100.0)
Platelets: 112 10*3/uL — ABNORMAL LOW (ref 150–400)
RBC: 2.43 MIL/uL — ABNORMAL LOW (ref 4.22–5.81)
RDW: 12.1 % (ref 11.5–15.5)
WBC: 7.2 10*3/uL (ref 4.0–10.5)
nRBC: 0 % (ref 0.0–0.2)

## 2019-01-11 LAB — GLUCOSE, CAPILLARY: Glucose-Capillary: 195 mg/dL — ABNORMAL HIGH (ref 70–99)

## 2019-01-11 MED ORDER — ONDANSETRON HCL 4 MG PO TABS: 4.0000 mg | ORAL_TABLET | Freq: Every day | ORAL | 1 refills | Status: AC | PRN

## 2019-01-11 MED ORDER — METOPROLOL SUCCINATE ER 50 MG PO TB24: 50.0000 mg | ORAL_TABLET | Freq: Every day | ORAL | Status: DC

## 2019-01-11 MED ORDER — BISACODYL 10 MG RE SUPP
10.0000 mg | Freq: Every day | RECTAL | Status: DC | PRN
  Administered 2019-01-11: 10 mg via RECTAL
  Filled 2019-01-11: qty 1

## 2019-01-11 NOTE — Progress Notes (Addendum)
Reviewed discharge information with patient and wife. Answered all questions. Pt able to teach back medications and reasons to contact MD/911. Patient verbalizes importance of urology, nephrology and PCP follow up appointment. Patient and wife demonstrate clean technique for foley care and changing between leg bag and drainage bag.   Dietician not available on weekend for low K teaching. Patient and wife feel comfortable selecting foods that are low in potassium and can verbalize several appropreiate food choices. Patient has also downloaded an app on his phone that educates him about low K food choices.  Barbee Shropshire. Brigitte Pulse, RN

## 2019-01-11 NOTE — Progress Notes (Addendum)
[facility] Progress Note  Subjective: creat stable at 6.3 last nigth and 6.1 this am.  No uremic symptoms.   Vitals:   01/10/19 1501 01/10/19 2045 01/11/19 0440 01/11/19 0623  BP: (!) 157/66 (!) 162/75 (!) 166/83   Pulse: 72 72 86   Resp: 15 18 18    Temp: 97.9 F (36.6 C) 98.6 F (37 C) 98.4 F (36.9 C)   TempSrc: Oral Oral Oral   SpO2: 98% 97% 95%   Weight:    88.9 kg  Height:        Inpatient medications: . atorvastatin  20 mg Oral Daily  . cloNIDine  0.2 mg Oral BID  . docusate sodium  100 mg Oral BID  . heparin  5,000 Units Subcutaneous Q8H  . hydrALAZINE  100 mg Oral TID  . insulin aspart  0-15 Units Subcutaneous Q4H  . metoprolol succinate  25 mg Oral QHS  . sodium zirconium cyclosilicate  10 g Oral Daily  . tamsulosin  0.4 mg Oral QHS   . sodium chloride 65 mL/hr at 01/10/19 1803   clonazePAM, diphenhydrAMINE **OR** diphenhydrAMINE, hydrALAZINE, HYDROcodone-acetaminophen, ondansetron  Iron/TIBC/Ferritin/ %Sat No results found for: IRON, TIBC, FERRITIN, IRONPCTSAT  Exam: Gen in no distress, calm No jvd or bruits R IJ TDC in place Chest clear bilat to bases, no rales or wheezing RRR no MRG  Abd soft ntnd no mass or ascites +bs, mult stab wounds from surgery GU normal male MS no joint effusions or deformity Ext no LE or UE edema, no wounds or ulcers  Neuro is alert, Ox 3 , nf    Home meds:  - aspirin 81 qd/ atorvastatin 20   - metoprolol xl 25 qd/ losartan 50 / hydralazine 100 tid/ clonidine 0.2 bid  - sitagliptin 50 qd/ insulin glargine 30-50 unit hs  - escitalopram 10 qd/ clonazepam 0.5mg  prn  - sod bicarb 650 bid   Assessment/ Rec: 1. AKI on CKD stage IV - baseline creat 3.5- 4.0, now is sp R nephrectomy. Creat stable low 6's today, OK to dc home.  He has f/u scheduled for Wed this week with his nephrologist.   2. 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 Hyperkalemia - stable, dc Lokelma. Low K+ diet education requested  prior to dc 4. Anemia of CKD - Hb 7.6, watch 5. HTN - cont clonidine/ hydralazine as prior; increased metoprolol to 50 xl qd and losartan should be dc'd completely.  6. DM2 - stable.     [facility] 01/11/2019, 8:43 AM  Recent Labs  Lab 01/05/19 0839  01/10/19 0507 01/10/19 1835 01/11/19 0523  NA  --    < > 134*  --  133*  K  --    < > 5.0  --  4.9  CL  --    < > 108  --  106  CO2  --    < > 17*  --  18*  GLUCOSE  --    < > 149*  --  129*  BUN  --    < > 75*  --  78*  CREATININE  --    < > 6.10* 6.32* 6.22*  CALCIUM  --    < > 7.9*  --  7.9*  INR 1.0  --   --   --   --    < > = values in this interval not displayed.   No results for input(s): AST, ALT, ALKPHOS,  BILITOT, PROT in the last 168 hours. Recent Labs  Lab 01/05/19 0839  01/10/19 0507 01/11/19 0523  WBC 4.0  --   --  7.2  HGB 9.8*   < > 7.6* 7.4*  HCT 29.9*   < > 24.3* 23.5*  MCV 92.3  --   --  96.7  PLT 125*  --   --  112*   < > = values in this interval not displayed.

## 2019-01-12 LAB — GLUCOSE, CAPILLARY
Glucose-Capillary: 115 mg/dL — ABNORMAL HIGH (ref 70–99)
Glucose-Capillary: 119 mg/dL — ABNORMAL HIGH (ref 70–99)
Glucose-Capillary: 123 mg/dL — ABNORMAL HIGH (ref 70–99)
Glucose-Capillary: 128 mg/dL — ABNORMAL HIGH (ref 70–99)

## 2019-07-22 ENCOUNTER — Ambulatory Visit (HOSPITAL_COMMUNITY)
Admission: RE | Admit: 2019-07-22 | Discharge: 2019-07-22 | Disposition: A | Discharge status: Home / Self Care | Payer: Managed Care, Other (non HMO) | Source: Ambulatory Visit | Attending: Urology | Admitting: Urology

## 2019-07-22 ENCOUNTER — Other Ambulatory Visit: Payer: Self-pay

## 2019-07-22 ENCOUNTER — Other Ambulatory Visit (HOSPITAL_COMMUNITY): Payer: Self-pay | Admitting: Urology

## 2019-07-22 DIAGNOSIS — C641 Malignant neoplasm of right kidney, except renal pelvis: Secondary | ICD-10-CM

## 2021-04-05 DIAGNOSIS — I214 Non-ST elevation (NSTEMI) myocardial infarction: Secondary | ICD-10-CM

## 2021-04-05 HISTORY — DX: Non-ST elevation (NSTEMI) myocardial infarction: I21.4

## 2021-04-10 ENCOUNTER — Emergency Department (HOSPITAL_COMMUNITY): Payer: Managed Care, Other (non HMO)

## 2021-04-10 ENCOUNTER — Other Ambulatory Visit: Payer: Self-pay

## 2021-04-10 ENCOUNTER — Encounter (HOSPITAL_COMMUNITY): Payer: Self-pay

## 2021-04-10 ENCOUNTER — Inpatient Hospital Stay (HOSPITAL_COMMUNITY): Payer: Managed Care, Other (non HMO)

## 2021-04-10 ENCOUNTER — Inpatient Hospital Stay (HOSPITAL_COMMUNITY)
Admission: EM | Admit: 2021-04-10 | Discharge: 2021-04-20 | DRG: 853 | Disposition: A | Discharge status: Home Health | Payer: Managed Care, Other (non HMO) | Attending: Internal Medicine | Admitting: Internal Medicine

## 2021-04-10 DIAGNOSIS — I70229 Atherosclerosis of native arteries of extremities with rest pain, unspecified extremity: Secondary | ICD-10-CM

## 2021-04-10 DIAGNOSIS — Z823 Family history of stroke: Secondary | ICD-10-CM

## 2021-04-10 DIAGNOSIS — D631 Anemia in chronic kidney disease: Secondary | ICD-10-CM | POA: Diagnosis present

## 2021-04-10 DIAGNOSIS — Z961 Presence of intraocular lens: Secondary | ICD-10-CM | POA: Diagnosis present

## 2021-04-10 DIAGNOSIS — N185 Chronic kidney disease, stage 5: Secondary | ICD-10-CM | POA: Diagnosis not present

## 2021-04-10 DIAGNOSIS — R0600 Dyspnea, unspecified: Secondary | ICD-10-CM | POA: Diagnosis not present

## 2021-04-10 DIAGNOSIS — L97529 Non-pressure chronic ulcer of other part of left foot with unspecified severity: Secondary | ICD-10-CM | POA: Diagnosis not present

## 2021-04-10 DIAGNOSIS — Z89421 Acquired absence of other right toe(s): Secondary | ICD-10-CM

## 2021-04-10 DIAGNOSIS — I700 Atherosclerosis of aorta: Secondary | ICD-10-CM | POA: Diagnosis present

## 2021-04-10 DIAGNOSIS — I5043 Acute on chronic combined systolic (congestive) and diastolic (congestive) heart failure: Secondary | ICD-10-CM | POA: Diagnosis present

## 2021-04-10 DIAGNOSIS — L8992 Pressure ulcer of unspecified site, stage 2: Secondary | ICD-10-CM | POA: Insufficient documentation

## 2021-04-10 DIAGNOSIS — I132 Hypertensive heart and chronic kidney disease with heart failure and with stage 5 chronic kidney disease, or end stage renal disease: Secondary | ICD-10-CM | POA: Diagnosis present

## 2021-04-10 DIAGNOSIS — Z9841 Cataract extraction status, right eye: Secondary | ICD-10-CM

## 2021-04-10 DIAGNOSIS — L97429 Non-pressure chronic ulcer of left heel and midfoot with unspecified severity: Secondary | ICD-10-CM | POA: Diagnosis present

## 2021-04-10 DIAGNOSIS — L03116 Cellulitis of left lower limb: Secondary | ICD-10-CM | POA: Diagnosis present

## 2021-04-10 DIAGNOSIS — I214 Non-ST elevation (NSTEMI) myocardial infarction: Secondary | ICD-10-CM | POA: Diagnosis present

## 2021-04-10 DIAGNOSIS — Z951 Presence of aortocoronary bypass graft: Secondary | ICD-10-CM | POA: Diagnosis not present

## 2021-04-10 DIAGNOSIS — A419 Sepsis, unspecified organism: Principal | ICD-10-CM

## 2021-04-10 DIAGNOSIS — Z85528 Personal history of other malignant neoplasm of kidney: Secondary | ICD-10-CM

## 2021-04-10 DIAGNOSIS — I5031 Acute diastolic (congestive) heart failure: Secondary | ICD-10-CM | POA: Diagnosis not present

## 2021-04-10 DIAGNOSIS — I509 Heart failure, unspecified: Secondary | ICD-10-CM

## 2021-04-10 DIAGNOSIS — I739 Peripheral vascular disease, unspecified: Secondary | ICD-10-CM | POA: Diagnosis not present

## 2021-04-10 DIAGNOSIS — E11610 Type 2 diabetes mellitus with diabetic neuropathic arthropathy: Secondary | ICD-10-CM | POA: Diagnosis present

## 2021-04-10 DIAGNOSIS — G9341 Metabolic encephalopathy: Secondary | ICD-10-CM | POA: Diagnosis present

## 2021-04-10 DIAGNOSIS — Z79899 Other long term (current) drug therapy: Secondary | ICD-10-CM

## 2021-04-10 DIAGNOSIS — E11649 Type 2 diabetes mellitus with hypoglycemia without coma: Secondary | ICD-10-CM | POA: Diagnosis not present

## 2021-04-10 DIAGNOSIS — I2510 Atherosclerotic heart disease of native coronary artery without angina pectoris: Secondary | ICD-10-CM | POA: Diagnosis present

## 2021-04-10 DIAGNOSIS — L89623 Pressure ulcer of left heel, stage 3: Secondary | ICD-10-CM | POA: Diagnosis present

## 2021-04-10 DIAGNOSIS — N186 End stage renal disease: Secondary | ICD-10-CM | POA: Diagnosis present

## 2021-04-10 DIAGNOSIS — Z905 Acquired absence of kidney: Secondary | ICD-10-CM

## 2021-04-10 DIAGNOSIS — L8990 Pressure ulcer of unspecified site, unspecified stage: Secondary | ICD-10-CM | POA: Insufficient documentation

## 2021-04-10 DIAGNOSIS — F419 Anxiety disorder, unspecified: Secondary | ICD-10-CM | POA: Diagnosis present

## 2021-04-10 DIAGNOSIS — E8889 Other specified metabolic disorders: Secondary | ICD-10-CM | POA: Diagnosis present

## 2021-04-10 DIAGNOSIS — R6520 Severe sepsis without septic shock: Secondary | ICD-10-CM | POA: Diagnosis not present

## 2021-04-10 DIAGNOSIS — I96 Gangrene, not elsewhere classified: Secondary | ICD-10-CM | POA: Diagnosis not present

## 2021-04-10 DIAGNOSIS — I70245 Atherosclerosis of native arteries of left leg with ulceration of other part of foot: Secondary | ICD-10-CM | POA: Diagnosis not present

## 2021-04-10 DIAGNOSIS — I70249 Atherosclerosis of native arteries of left leg with ulceration of unspecified site: Secondary | ICD-10-CM | POA: Diagnosis present

## 2021-04-10 DIAGNOSIS — I70263 Atherosclerosis of native arteries of extremities with gangrene, bilateral legs: Secondary | ICD-10-CM | POA: Diagnosis present

## 2021-04-10 DIAGNOSIS — Z9049 Acquired absence of other specified parts of digestive tract: Secondary | ICD-10-CM

## 2021-04-10 DIAGNOSIS — R778 Other specified abnormalities of plasma proteins: Secondary | ICD-10-CM | POA: Diagnosis not present

## 2021-04-10 DIAGNOSIS — E1122 Type 2 diabetes mellitus with diabetic chronic kidney disease: Secondary | ICD-10-CM

## 2021-04-10 DIAGNOSIS — E785 Hyperlipidemia, unspecified: Secondary | ICD-10-CM | POA: Diagnosis present

## 2021-04-10 DIAGNOSIS — Z9842 Cataract extraction status, left eye: Secondary | ICD-10-CM

## 2021-04-10 DIAGNOSIS — D61818 Other pancytopenia: Secondary | ICD-10-CM | POA: Diagnosis present

## 2021-04-10 DIAGNOSIS — R0609 Other forms of dyspnea: Secondary | ICD-10-CM | POA: Insufficient documentation

## 2021-04-10 DIAGNOSIS — L89892 Pressure ulcer of other site, stage 2: Secondary | ICD-10-CM | POA: Diagnosis present

## 2021-04-10 DIAGNOSIS — M7989 Other specified soft tissue disorders: Secondary | ICD-10-CM | POA: Diagnosis not present

## 2021-04-10 DIAGNOSIS — Z01810 Encounter for preprocedural cardiovascular examination: Secondary | ICD-10-CM | POA: Diagnosis not present

## 2021-04-10 DIAGNOSIS — E1152 Type 2 diabetes mellitus with diabetic peripheral angiopathy with gangrene: Secondary | ICD-10-CM | POA: Diagnosis present

## 2021-04-10 DIAGNOSIS — Z20822 Contact with and (suspected) exposure to covid-19: Secondary | ICD-10-CM | POA: Diagnosis present

## 2021-04-10 DIAGNOSIS — R609 Edema, unspecified: Secondary | ICD-10-CM | POA: Diagnosis present

## 2021-04-10 DIAGNOSIS — I5021 Acute systolic (congestive) heart failure: Secondary | ICD-10-CM | POA: Diagnosis not present

## 2021-04-10 DIAGNOSIS — Z992 Dependence on renal dialysis: Secondary | ICD-10-CM

## 2021-04-10 DIAGNOSIS — I959 Hypotension, unspecified: Secondary | ICD-10-CM | POA: Diagnosis present

## 2021-04-10 DIAGNOSIS — E11621 Type 2 diabetes mellitus with foot ulcer: Secondary | ICD-10-CM | POA: Diagnosis present

## 2021-04-10 DIAGNOSIS — G9340 Encephalopathy, unspecified: Secondary | ICD-10-CM | POA: Diagnosis not present

## 2021-04-10 DIAGNOSIS — I7092 Chronic total occlusion of artery of the extremities: Secondary | ICD-10-CM | POA: Diagnosis not present

## 2021-04-10 DIAGNOSIS — N19 Unspecified kidney failure: Secondary | ICD-10-CM | POA: Diagnosis present

## 2021-04-10 DIAGNOSIS — E876 Hypokalemia: Secondary | ICD-10-CM | POA: Diagnosis not present

## 2021-04-10 DIAGNOSIS — D6959 Other secondary thrombocytopenia: Secondary | ICD-10-CM | POA: Diagnosis present

## 2021-04-10 DIAGNOSIS — Z8673 Personal history of transient ischemic attack (TIA), and cerebral infarction without residual deficits: Secondary | ICD-10-CM

## 2021-04-10 DIAGNOSIS — I10 Essential (primary) hypertension: Secondary | ICD-10-CM | POA: Diagnosis present

## 2021-04-10 DIAGNOSIS — E1151 Type 2 diabetes mellitus with diabetic peripheral angiopathy without gangrene: Secondary | ICD-10-CM | POA: Diagnosis not present

## 2021-04-10 DIAGNOSIS — I120 Hypertensive chronic kidney disease with stage 5 chronic kidney disease or end stage renal disease: Secondary | ICD-10-CM | POA: Diagnosis not present

## 2021-04-10 DIAGNOSIS — Z794 Long term (current) use of insulin: Secondary | ICD-10-CM | POA: Diagnosis not present

## 2021-04-10 DIAGNOSIS — L0390 Cellulitis, unspecified: Secondary | ICD-10-CM | POA: Diagnosis not present

## 2021-04-10 LAB — RENAL FUNCTION PANEL
Albumin: 2.4 g/dL — ABNORMAL LOW (ref 3.5–5.0)
Anion gap: 14 (ref 5–15)
BUN: 87 mg/dL — ABNORMAL HIGH (ref 8–23)
CO2: 25 mmol/L (ref 22–32)
Calcium: 8.1 mg/dL — ABNORMAL LOW (ref 8.9–10.3)
Chloride: 97 mmol/L — ABNORMAL LOW (ref 98–111)
Creatinine, Ser: 10.25 mg/dL — ABNORMAL HIGH (ref 0.61–1.24)
GFR, Estimated: 5 mL/min — ABNORMAL LOW (ref >60)
Glucose, Bld: 57 mg/dL — ABNORMAL LOW (ref 70–99)
Phosphorus: 8.1 mg/dL — ABNORMAL HIGH (ref 2.5–4.6)
Potassium: 4.3 mmol/L (ref 3.5–5.1)
Sodium: 136 mmol/L (ref 135–145)

## 2021-04-10 LAB — TROPONIN I (HIGH SENSITIVITY)
Troponin I (High Sensitivity): 1294 ng/L (ref <18)
Troponin I (High Sensitivity): 1335 ng/L (ref <18)
Troponin I (High Sensitivity): 1325 ng/L (ref <18)
Troponin I (High Sensitivity): 1336 ng/L (ref <18)

## 2021-04-10 LAB — BASIC METABOLIC PANEL
Anion gap: 15 (ref 5–15)
BUN: 82 mg/dL — ABNORMAL HIGH (ref 8–23)
CO2: 23 mmol/L (ref 22–32)
Calcium: 8.7 mg/dL — ABNORMAL LOW (ref 8.9–10.3)
Chloride: 98 mmol/L (ref 98–111)
Creatinine, Ser: 9.76 mg/dL — ABNORMAL HIGH (ref 0.61–1.24)
GFR, Estimated: 5 mL/min — ABNORMAL LOW (ref >60)
Glucose, Bld: 106 mg/dL — ABNORMAL HIGH (ref 70–99)
Potassium: 4.5 mmol/L (ref 3.5–5.1)
Sodium: 136 mmol/L (ref 135–145)

## 2021-04-10 LAB — LIPID PANEL
Cholesterol: 122 mg/dL (ref 0–200)
HDL: 32 mg/dL — ABNORMAL LOW (ref >40)
LDL Cholesterol: 74 mg/dL (ref 0–99)
Total CHOL/HDL Ratio: 3.8 RATIO
Triglycerides: 81 mg/dL (ref <150)
VLDL: 16 mg/dL (ref 0–40)

## 2021-04-10 LAB — HEPATIC FUNCTION PANEL
ALT: 15 U/L (ref 0–44)
ALT: 14 U/L (ref 0–44)
ALT: 15 U/L (ref 0–44)
AST: 23 U/L (ref 15–41)
AST: 23 U/L (ref 15–41)
AST: 29 U/L (ref 15–41)
Albumin: 2.3 g/dL — ABNORMAL LOW (ref 3.5–5.0)
Albumin: 2.2 g/dL — ABNORMAL LOW (ref 3.5–5.0)
Albumin: 2.3 g/dL — ABNORMAL LOW (ref 3.5–5.0)
Alkaline Phosphatase: 86 U/L (ref 38–126)
Alkaline Phosphatase: 79 U/L (ref 38–126)
Alkaline Phosphatase: 85 U/L (ref 38–126)
Bilirubin, Direct: 0.2 mg/dL (ref 0.0–0.2)
Bilirubin, Direct: 0.2 mg/dL (ref 0.0–0.2)
Bilirubin, Direct: 0.3 mg/dL — ABNORMAL HIGH (ref 0.0–0.2)
Indirect Bilirubin: 0.5 mg/dL (ref 0.3–0.9)
Indirect Bilirubin: 0.7 mg/dL (ref 0.3–0.9)
Indirect Bilirubin: 0.8 mg/dL (ref 0.3–0.9)
Total Bilirubin: 0.7 mg/dL (ref 0.3–1.2)
Total Bilirubin: 1 mg/dL (ref 0.3–1.2)
Total Bilirubin: 1 mg/dL (ref 0.3–1.2)
Total Protein: 5.5 g/dL — ABNORMAL LOW (ref 6.5–8.1)
Total Protein: 5.2 g/dL — ABNORMAL LOW (ref 6.5–8.1)
Total Protein: 5.7 g/dL — ABNORMAL LOW (ref 6.5–8.1)

## 2021-04-10 LAB — CBC
HCT: 29 % — ABNORMAL LOW (ref 39.0–52.0)
HCT: 28.2 % — ABNORMAL LOW (ref 39.0–52.0)
Hemoglobin: 9.1 g/dL — ABNORMAL LOW (ref 13.0–17.0)
Hemoglobin: 8.7 g/dL — ABNORMAL LOW (ref 13.0–17.0)
MCH: 31.1 pg (ref 26.0–34.0)
MCH: 30.5 pg (ref 26.0–34.0)
MCHC: 31.4 g/dL (ref 30.0–36.0)
MCHC: 30.9 g/dL (ref 30.0–36.0)
MCV: 99 fL (ref 80.0–100.0)
MCV: 98.9 fL (ref 80.0–100.0)
Platelets: 109 10*3/uL — ABNORMAL LOW (ref 150–400)
Platelets: 113 10*3/uL — ABNORMAL LOW (ref 150–400)
RBC: 2.93 MIL/uL — ABNORMAL LOW (ref 4.22–5.81)
RBC: 2.85 MIL/uL — ABNORMAL LOW (ref 4.22–5.81)
RDW: 14.9 % (ref 11.5–15.5)
RDW: 15 % (ref 11.5–15.5)
WBC: 5.9 10*3/uL (ref 4.0–10.5)
WBC: 4.4 10*3/uL (ref 4.0–10.5)
nRBC: 0 % (ref 0.0–0.2)
nRBC: 0 % (ref 0.0–0.2)

## 2021-04-10 LAB — ECHOCARDIOGRAM COMPLETE
AR max vel: 1 cm2
AV Area VTI: 0.96 cm2
AV Area mean vel: 1.07 cm2
AV Mean grad: 16 mmHg
AV Peak grad: 30 mmHg
Ao pk vel: 2.74 m/s
Area-P 1/2: 5.13 cm2
S' Lateral: 4.3 cm

## 2021-04-10 LAB — HIV ANTIBODY (ROUTINE TESTING W REFLEX): HIV Screen 4th Generation wRfx: NONREACTIVE

## 2021-04-10 LAB — LIPASE, BLOOD: Lipase: 25 U/L (ref 11–51)

## 2021-04-10 LAB — RESP PANEL BY RT-PCR (FLU A&B, COVID) ARPGX2
Influenza A by PCR: NEGATIVE
Influenza B by PCR: NEGATIVE
SARS Coronavirus 2 by RT PCR: NEGATIVE

## 2021-04-10 LAB — DIC (DISSEMINATED INTRAVASCULAR COAGULATION)PANEL
D-Dimer, Quant: 2.86 ug/mL-FEU — ABNORMAL HIGH (ref 0.00–0.50)
Fibrinogen: 538 mg/dL — ABNORMAL HIGH (ref 210–475)
INR: 1.2 (ref 0.8–1.2)
Platelets: 97 10*3/uL — ABNORMAL LOW (ref 150–400)
Prothrombin Time: 15.1 seconds (ref 11.4–15.2)
Smear Review: NONE SEEN
aPTT: 32 seconds (ref 24–36)

## 2021-04-10 LAB — AMYLASE: Amylase: 27 U/L — ABNORMAL LOW (ref 28–100)

## 2021-04-10 LAB — LACTIC ACID, PLASMA
Lactic Acid, Venous: 1 mmol/L (ref 0.5–1.9)
Lactic Acid, Venous: 0.9 mmol/L (ref 0.5–1.9)

## 2021-04-10 LAB — GLUCOSE, CAPILLARY: Glucose-Capillary: 87 mg/dL (ref 70–99)

## 2021-04-10 LAB — HEPATITIS B SURFACE ANTIGEN: Hepatitis B Surface Ag: NONREACTIVE

## 2021-04-10 LAB — BRAIN NATRIURETIC PEPTIDE: B Natriuretic Peptide: 4239.3 pg/mL — ABNORMAL HIGH (ref 0.0–100.0)

## 2021-04-10 MED ORDER — HEPARIN SODIUM (PORCINE) 1000 UNIT/ML DIALYSIS
1000.0000 [IU] | INTRAMUSCULAR | Status: DC | PRN
  Filled 2021-04-10: qty 1

## 2021-04-10 MED ORDER — CHLORHEXIDINE GLUCONATE CLOTH 2 % EX PADS
6.0000 | MEDICATED_PAD | Freq: Every day | CUTANEOUS | Status: DC
  Administered 2021-04-11 – 2021-04-13 (×3): 6 via TOPICAL

## 2021-04-10 MED ORDER — VANCOMYCIN VARIABLE DOSE PER UNSTABLE RENAL FUNCTION (PHARMACIST DOSING): Status: DC

## 2021-04-10 MED ORDER — LIDOCAINE HCL (PF) 1 % IJ SOLN: 5.0000 mL | INTRAMUSCULAR | Status: DC | PRN

## 2021-04-10 MED ORDER — SODIUM CHLORIDE 0.9 % IV SOLN
250.0000 mL | INTRAVENOUS | Status: DC
  Administered 2021-04-11 – 2021-04-20 (×8): 250 mL via INTRAVENOUS

## 2021-04-10 MED ORDER — DOCUSATE SODIUM 100 MG PO CAPS: 100.0000 mg | ORAL_CAPSULE | Freq: Two times a day (BID) | ORAL | Status: DC | PRN

## 2021-04-10 MED ORDER — VANCOMYCIN HCL 1750 MG/350ML IV SOLN
1750.0000 mg | Freq: Once | INTRAVENOUS | Status: AC
  Administered 2021-04-10: 1750 mg via INTRAVENOUS
  Filled 2021-04-10: qty 350

## 2021-04-10 MED ORDER — POLYETHYLENE GLYCOL 3350 17 G PO PACK: 17.0000 g | PACK | Freq: Every day | ORAL | Status: DC | PRN

## 2021-04-10 MED ORDER — INSULIN ASPART 100 UNIT/ML IJ SOLN
0.0000 [IU] | Freq: Three times a day (TID) | INTRAMUSCULAR | Status: DC
  Administered 2021-04-12: 4 [IU] via SUBCUTANEOUS
  Administered 2021-04-12 – 2021-04-17 (×6): 1 [IU] via SUBCUTANEOUS

## 2021-04-10 MED ORDER — NOREPINEPHRINE 4 MG/250ML-% IV SOLN
2.0000 ug/min | INTRAVENOUS | Status: DC
  Filled 2021-04-10: qty 250

## 2021-04-10 MED ORDER — POLYETHYLENE GLYCOL 3350 17 G PO PACK: 17.0000 g | PACK | Freq: Every day | ORAL | Status: DC

## 2021-04-10 MED ORDER — PIPERACILLIN-TAZOBACTAM IN DEX 2-0.25 GM/50ML IV SOLN
2.2500 g | Freq: Three times a day (TID) | INTRAVENOUS | Status: DC
  Administered 2021-04-11 – 2021-04-20 (×29): 2.25 g via INTRAVENOUS
  Filled 2021-04-10 (×35): qty 50

## 2021-04-10 MED ORDER — PIPERACILLIN-TAZOBACTAM 3.375 G IVPB 30 MIN
3.3750 g | Freq: Once | INTRAVENOUS | Status: AC
  Administered 2021-04-10: 3.375 g via INTRAVENOUS

## 2021-04-10 MED ORDER — SODIUM CHLORIDE 0.9 % IV SOLN: 100.0000 mL | INTRAVENOUS | Status: DC | PRN

## 2021-04-10 MED ORDER — PERFLUTREN LIPID MICROSPHERE
1.0000 mL | INTRAVENOUS | Status: AC | PRN
  Administered 2021-04-10: 2 mL via INTRAVENOUS
  Filled 2021-04-10: qty 10

## 2021-04-10 MED ORDER — LIDOCAINE-PRILOCAINE 2.5-2.5 % EX CREA
1.0000 "application " | TOPICAL_CREAM | CUTANEOUS | Status: DC | PRN
  Filled 2021-04-10: qty 5

## 2021-04-10 MED ORDER — HEPARIN SODIUM (PORCINE) 5000 UNIT/ML IJ SOLN
5000.0000 [IU] | Freq: Three times a day (TID) | INTRAMUSCULAR | Status: DC
  Administered 2021-04-10 – 2021-04-14 (×11): 5000 [IU] via SUBCUTANEOUS
  Filled 2021-04-10 (×11): qty 1

## 2021-04-10 MED ORDER — HEPARIN SODIUM (PORCINE) 1000 UNIT/ML DIALYSIS
20.0000 [IU]/kg | INTRAMUSCULAR | Status: DC | PRN
  Filled 2021-04-10: qty 2

## 2021-04-10 MED ORDER — PENTAFLUOROPROP-TETRAFLUOROETH EX AERO: 1.0000 "application " | INHALATION_SPRAY | CUTANEOUS | Status: DC | PRN

## 2021-04-10 MED ORDER — PANTOPRAZOLE SODIUM 40 MG IV SOLR
40.0000 mg | Freq: Every day | INTRAVENOUS | Status: DC
  Administered 2021-04-10 – 2021-04-11 (×2): 40 mg via INTRAVENOUS
  Filled 2021-04-10 (×2): qty 40

## 2021-04-10 MED ORDER — ATORVASTATIN CALCIUM 80 MG PO TABS
80.0000 mg | ORAL_TABLET | Freq: Every day | ORAL | Status: DC
  Administered 2021-04-11 – 2021-04-20 (×9): 80 mg via ORAL
  Filled 2021-04-10 (×9): qty 1

## 2021-04-10 MED ORDER — ALTEPLASE 2 MG IJ SOLR: 2.0000 mg | Freq: Once | INTRAMUSCULAR | Status: DC | PRN

## 2021-04-10 NOTE — Consult Note (Signed)
ORTHOPAEDIC CONSULTATION  REQUESTING PHYSICIAN: Juanito Doom, MD  Chief Complaint: Painful ischemic ulcer left heel  HPI: Maxwell Harmon is a 65 y.o. male who presents with history of diabetes Charcot collapse of the right foot who has been going to podiatry in [facility] for wound care for the ischemic left heel ulcer.  Past Medical History:  Diagnosis Date  . Diabetes mellitus without complication ([place])   . History of bleeding ulcers   . History of TIAs   . Hypertension   . Low kidney function    Past Surgical History:  Procedure Laterality Date  . CATARACT EXTRACTION W/PHACO Left 02/15/2014   Procedure: CATARACT EXTRACTION PHACO AND INTRAOCULAR LENS PLACEMENT (IOC);  Surgeon: Tonny Branch, MD;  Location: [facility];  Service: Ophthalmology;  Laterality: Left;  CDE 10.84  . CATARACT EXTRACTION W/PHACO Right 12/14/2013   Procedure: CATARACT EXTRACTION PHACO AND INTRAOCULAR LENS PLACEMENT (IOC);  Surgeon: Tonny Branch, MD;  Location: [facility];  Service: Ophthalmology;  Laterality: Right;  CDE:  16.30  . CHOLECYSTECTOMY  02/2013  . EYE SURGERY    . IR FLUORO GUIDE CV LINE RIGHT  01/05/2019  . IR US GUIDE VASC ACCESS RIGHT  01/05/2019  . LAPAROSCOPIC NEPHRECTOMY Right 01/08/2019   Procedure: LAPAROSCOPIC RADICAL NEPHRECTOMY;  Surgeon: Raynelle Bring, MD;  Location: [facility];  Service: Urology;  Laterality: Right;  . PENILE PROSTHESIS IMPLANT    . TOE AMPUTATION Right 2013   [facility]   Social History   Socioeconomic History  . Marital status: Married    Spouse name: Not on file  . Number of children: Not on file  . Years of education: Not on file  . Highest education level: Not on file  Occupational History  . Not on file  Tobacco Use  . Smoking status: Never Smoker  . Smokeless tobacco: Never Used  Vaping Use  . Vaping Use: Never used  Substance and Sexual Activity  . Alcohol use: Yes    Comment: social drink   . Drug use: No  . Sexual activity: Not on  file  Other Topics Concern  . Not on file  Social History Narrative  . Not on file   Social Determinants of Health   Financial Resource Strain: Not on file  Food Insecurity: Not on file  Transportation Needs: Not on file  Physical Activity: Not on file  Stress: Not on file  Social Connections: Not on file   Family History  Problem Relation Age of Onset  . Stroke Mother   . Stroke Father   . Cancer Father   . Cancer Brother    - negative except otherwise stated in the family history section No Known Allergies Prior to Admission medications   Medication Sig Start Date End Date Taking? Authorizing Provider  atorvastatin (LIPITOR) 20 MG tablet Take 20 mg by mouth daily.    [provider]  blood glucose meter kit and supplies KIT Dispense based on patient and insurance preference. Use up to four times daily as directed. (FOR ICD-9 250.00, 250.01). 11/08/15   Cassandria Anger, MD  Blood Glucose Monitoring Suppl (BAYER CONTOUR MONITOR) w/Device KIT 1 each by Does not apply route 4 (four) times daily. Test 4 x daily 11/08/15   Cassandria Anger, MD  clonazePAM (KLONOPIN) 0.5 MG tablet Take 0.5 mg by mouth daily as needed for anxiety.    [provider]  cloNIDine (CATAPRES) 0.2 MG tablet Take 0.2 mg by mouth  2 (two) times daily.    [provider]  escitalopram (LEXAPRO) 10 MG tablet Take 10 mg by mouth daily.    [provider]  glucose blood (BAYER CONTOUR TEST) test strip Use as instructed 4 x daily 11/08/15   Cassandria Anger, MD  hydrALAZINE (APRESOLINE) 100 MG tablet Take 100 mg by mouth 3 (three) times daily.    [provider]  HYDROcodone-acetaminophen (NORCO/VICODIN) 5-325 MG tablet Take 1-2 tablets by mouth every 6 (six) hours as needed. 01/09/19   Raynelle Bring, MD  Insulin Glargine [facility] KWIKPEN) 100 UNIT/ML SOPN Inject 30-50 Units into the skin at bedtime.    [provider]  Insulin Pen Needle (B-D ULTRAFINE  III SHORT PEN) 31G X 8 MM MISC 1 each by Does not apply route as directed. 09/02/15   Cassandria Anger, MD  Iron-FA-B Cmp-C-Biot-Probiotic (FUSION PLUS) CAPS Take 1 tablet by mouth daily. 11/20/18   [provider]  Lancets MISC 1 each by Does not apply route 4 (four) times daily. 11/08/15   Cassandria Anger, MD  metoprolol succinate (TOPROL-XL) 25 MG 24 hr tablet Take 25 mg by mouth daily.    [provider]  sitaGLIPtin (JANUVIA) 50 MG tablet Take 50 mg by mouth daily.    [provider]  sodium bicarbonate 650 MG tablet Take 650 mg by mouth 2 (two) times daily.    [provider]   DG Chest 2 View  Result Date: 04/10/2021 CLINICAL DATA:  Shortness of breath EXAM: CHEST - 2 VIEW COMPARISON:  04/13/2020 FINDINGS: Prior CABG. Mild cardiomegaly and vascular congestion. Bibasilar atelectasis. Suspect small effusions. No acute bony abnormality. IMPRESSION: Mild cardiomegaly, vascular congestion. Bibasilar atelectasis with small effusions. Electronically Signed   By: Rolm Baptise M.D.   On: 04/10/2021 11:00   ECHOCARDIOGRAM COMPLETE  Result Date: 04/10/2021    ECHOCARDIOGRAM REPORT   Patient Name:   Maxwell Harmon Date of Exam: 04/10/2021 Medical Rec #:  [id number]       Height:       70.0 in Accession #:    [id number]      Weight:       196.0 lb Date of Birth:  02-24-1956       BSA:          2.069 m Patient Age:    24 years        BP:           111/56 mmHg Patient Gender: M               HR:           67 bpm. Exam Location:  Inpatient Procedure: 2D Echo, Cardiac Doppler, Color Doppler and Intracardiac            Opacification Agent Indications:    I09.73 Acute diastolic (congestive) heart failure  History:        Patient has no prior history of Echocardiogram examinations.                 Prior CABG, Signs/Symptoms:Shortness of Breath and Confusion;                 Risk Factors:Diabetes. ESRD on HD. LEE.  Sonographer:    Merrie Roof RDCS Referring Phys: [phone] PHILIP J  NAHSER IMPRESSIONS  1. LV function appears preserved at the base and diminished moving toward apex, akinetic at the apex. This is across all segments. Differential includes takotsubo's cardiomyopathy vs. multivessel CAD.  LV thrombus excluded by contrast. Left ventricular ejection fraction, by estimation, is 30 to 35%. The left ventricle has moderately decreased function. The left ventricle demonstrates regional wall motion abnormalities (see scoring diagram/findings for description). The left ventricular internal cavity size was severely dilated. Left ventricular diastolic parameters are indeterminate.  2. Right ventricular systolic function is moderately reduced. The right ventricular size is moderately enlarged.  3. Left atrial size was moderately dilated.  4. Right atrial size was mildly dilated.  5. The mitral valve is degenerative. Mild to moderate mitral valve regurgitation. No evidence of mitral stenosis.  6. The aortic valve is tricuspid. There is moderate calcification of the aortic valve. There is moderate thickening of the aortic valve. Aortic valve regurgitation is not visualized. Mild aortic valve stenosis.  7. The inferior vena cava is dilated in size with <50% respiratory variability, suggesting right atrial pressure of 15 mmHg. Comparison(s): No prior Echocardiogram. Conclusion(s)/Recommendation(s): Severely reduced LVEF with worse wall motion at the apex compared to the base. Concerning for takotsubo's vs. multivessel CAD/apical LAD. Findings communicated with Dr. Acie Fredrickson. FINDINGS  Left Ventricle: LV function appears preserved at the base and diminished moving toward apex, akinetic at the apex. This is across all segments. Differential includes takotsubo's cardiomyopathy vs. multivessel CAD. LV thrombus excluded by contrast. Left ventricular ejection fraction, by estimation, is 30 to 35%. The left ventricle has moderately decreased function. The left ventricle demonstrates regional wall motion  abnormalities. Definity contrast agent was given IV to delineate the left ventricular endocardial borders. The left ventricular internal cavity size was severely dilated. There is borderline left ventricular hypertrophy. Left ventricular diastolic parameters are indeterminate. Right Ventricle: The right ventricular size is moderately enlarged. Right vetricular wall thickness was not well visualized. Right ventricular systolic function is moderately reduced. Left Atrium: Left atrial size was moderately dilated. Right Atrium: Right atrial size was mildly dilated. Pericardium: There is no evidence of pericardial effusion. Mitral Valve: The mitral valve is degenerative in appearance. There is mild thickening of the mitral valve leaflet(s). There is mild calcification of the mitral valve leaflet(s). Mild to moderate mitral valve regurgitation. No evidence of mitral valve stenosis. Tricuspid Valve: The tricuspid valve is normal in structure. Tricuspid valve regurgitation is trivial. No evidence of tricuspid stenosis. Aortic Valve: The aortic valve is tricuspid. There is moderate calcification of the aortic valve. There is moderate thickening of the aortic valve. Aortic valve regurgitation is not visualized. Mild aortic stenosis is present. Aortic valve mean gradient measures 16.0 mmHg. Aortic valve peak gradient measures 30.0 mmHg. Aortic valve area, by VTI measures 0.96 cm. Pulmonic Valve: The pulmonic valve was not well visualized. Pulmonic valve regurgitation is not visualized. Aorta: The aortic root and ascending aorta are structurally normal, with no evidence of dilitation. Venous: The inferior vena cava is dilated in size with less than 50% respiratory variability, suggesting right atrial pressure of 15 mmHg. IAS/Shunts: The atrial septum is grossly normal.  LEFT VENTRICLE PLAX 2D LVIDd:         6.30 cm LVIDs:         4.30 cm LV PW:         1.20 cm LV IVS:        0.90 cm LVOT diam:     2.00 cm LV SV:         56 LV  SV Index:   27 LVOT Area:     3.14 cm  RIGHT VENTRICLE  IVC RV Basal diam:  4.60 cm    IVC diam: 2.40 cm RV Mid diam:    3.90 cm RV S prime:     6.53 cm/s TAPSE (M-mode): 1.5 cm LEFT ATRIUM             Index       RIGHT ATRIUM           Index LA diam:        4.90 cm 2.37 cm/m  RA Area:     15.00 cm LA Vol (A2C):   90.3 ml 43.64 ml/m RA Volume:   42.80 ml  20.68 ml/m LA Vol (A4C):   87.5 ml 42.28 ml/m LA Biplane Vol: 89.3 ml 43.15 ml/m  AORTIC VALVE AV Area (Vmax):    1.00 cm AV Area (Vmean):   1.07 cm AV Area (VTI):     0.96 cm AV Vmax:           274.00 cm/s AV Vmean:          185.000 cm/s AV VTI:            0.576 m AV Peak Grad:      30.0 mmHg AV Mean Grad:      16.0 mmHg LVOT Vmax:         87.20 cm/s LVOT Vmean:        63.200 cm/s LVOT VTI:          0.177 m LVOT/AV VTI ratio: 0.31  AORTA Ao Root diam: 2.90 cm MITRAL VALVE MV Area (PHT): 5.13 cm     SHUNTS MV Decel Time: 148 msec     Systemic VTI:  0.18 m MV E velocity: 127.00 cm/s  Systemic Diam: 2.00 cm MV A velocity: 43.20 cm/s MV E/A ratio:  2.94 Buford Dresser MD Electronically signed by Buford Dresser MD Signature Date/Time: 04/10/2021/4:17:26 PM    Final    - pertinent xrays, CT, MRI studies were reviewed and independently interpreted  Positive ROS: All other systems have been reviewed and were otherwise negative with the exception of those mentioned in the HPI and as above.  Physical Exam: General: Alert, no acute distress Psychiatric: Patient is competent for consent with normal mood and affect Lymphatic: No axillary or cervical lymphadenopathy Cardiovascular: No pedal edema Respiratory: No cyanosis, no use of accessory musculature GI: No organomegaly, abdomen is soft and non-tender    Images:  '@ENCIMAGES' @  Labs:  Lab Results  Component Value Date   HGBA1C 6.9 (H) 01/02/2019   HGBA1C 7.5 11/08/2015   HGBA1C 9.6 (A) 10/02/2014    Lab Results  Component Value Date   ALBUMIN 3.1 (L) 10/29/2009      CBC EXTENDED Latest Ref Rng & Units 04/10/2021 01/11/2019 01/10/2019  WBC 4.0 - 10.5 K/uL 5.9 7.2 -  RBC 4.22 - 5.81 MIL/uL 2.93(L) 2.43(L) -  HGB 13.0 - 17.0 g/dL 9.1(L) 7.4(L) 7.6(L)  HCT 39.0 - 52.0 % 29.0(L) 23.5(L) 24.3(L)  PLT 150 - 400 K/uL 109(L) 112(L) -    Neurologic: Patient does not have protective sensation bilateral lower extremities.   MUSCULOSKELETAL:   Skin: Examination patient has a 3 cm ischemic gangrenous ulcer over the lateral aspect of the left heel.  His left foot is cold to the touch, the right foot is warm, he has a Charcot collapse of the right foot but no ulcers no redness no active Charcot foot arthropathy on the right.  Patient has a small ischemic ulcer dorsally over the IP joint of  the left great toe.  I used a portable Doppler but could not get a dopplerable dorsalis pedis or posterior tibial pulse it may have been a function of the Doppler.  Current white blood cell count 5.7, hemoglobin 9.1.  No recent hemoglobin A1c or albumin on the chart.  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  Plan: I have contacted Dr. Luan Pulling of vascular vein surgery he will evaluate the patient this evening and anticipate arteriogram study possibly tomorrow.  I will follow-up with the patient as needed during his hospital stay.  Thank you for the consult and the opportunity to see Mr. Vu Liebman, [place] [phone] 6:15 PM

## 2021-04-10 NOTE — Progress Notes (Signed)
eLink Physician-Brief Progress Note Patient Name: Maxwell Harmon DOB: 1956/01/26 MRN: [id number]   Date of Service  04/10/2021  HPI/Events of Note  65 yr old male admitted to ICU for 1. Hypotension/shcok from Cardiogenic-NSTEMI +/- sepsis . Chronic gangrenous left foot ulcer. - getting vanc/cefpime. - levo to keep MAP > 65. - follow LA - seen by Ortho already, vascular surgery team seeing in AM- arteriogram in AM  2. ESRD on intermittent OD at home. Irregular and not working. - now starting HD. MAP soft. Will need levo via PIV for now. If need ing over 10 mcg, get CVL.  3. HTN/CABG/EF last one OK. ?. NSTEMI - follow troponin. 2 nd troponin decreasing.  EKG: sinus and no STEMI. Lateral ST flattening.  qtc 453.  - Cardiology consultation in AM - watch for arhythmia. K 4.2.  - no AC or asa at this time.   4. Anemia of chronic kidney disease. Transfuse if < 7.   Camera: Getting hooked up to HD Getting vanco. No pressors yet, levo while on HD. No CVL yet. HR 78, 93% on room air. MAP 64.   eICU Interventions  As above CBG goals < 180      Intervention Category Major Interventions: Sepsis - evaluation and management;Other: Evaluation Type: New Patient Evaluation  Elmer Sow 04/10/2021, 10:00 PM

## 2021-04-10 NOTE — ED Provider Notes (Signed)
[facility] EMERGENCY DEPARTMENT Provider Note   CSN: [id number] Arrival date & time: 04/10/21  1009     History Chief Complaint  Patient presents with  . Shortness of Breath  . Fatigue    Maxwell Harmon is a 65 y.o. male.  Maxwell Harmon is a 65 y.o. male with hx of HTN, diabetes, renal cell carcinoma with radical nephrectomy, on hemodialysis, who presents with worsening exertional dyspnea x 3 days. Reports feeling SOB at rest which worsens when he walks or bends over. Denies any chest pain. Denies any cough or sick contacts. Reports some nausea and decreased appetite, has also had some nonbloody diarrhea. Denies any abdominal pain or diarrhea. He has a hx of renal carcinoma with nephrectomy in 2000 and is on dialysis, last treatment on Friday, 06/03. Was supposed to have dialysis today, but scheduled this for tomorrow due to podiatry appointment. 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 Denies fevers. Reports feeling generally weak, has had increasing difficulty ambulating due to charcot foot. Talked with his son-in-law, who is a Education officer, environmental here, due to worsening symptoms and he encouraged him to come in and get checked out.  The history is provided by the patient.       Past Medical History:  Diagnosis Date  . Diabetes mellitus without complication ([place])   . History of bleeding ulcers   . History of TIAs   . Hypertension   . Low kidney function     Patient Active Problem List   Diagnosis Date Noted  . 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 Diabetes mellitus with stage 4 chronic kidney disease ([place]) 09/02/2015  . Hyperlipidemia 09/02/2015  . Essential hypertension, benign 09/02/2015  . Obesity due to excess calories 09/02/2015    Past Surgical History:  Procedure Laterality Date  . CATARACT EXTRACTION W/PHACO Left 02/15/2014   Procedure: CATARACT EXTRACTION PHACO AND INTRAOCULAR LENS PLACEMENT (IOC);  Surgeon: Tonny Branch, MD;  Location: [facility];  Service: Ophthalmology;  Laterality: Left;  CDE 10.84  . CATARACT EXTRACTION W/PHACO Right 12/14/2013   Procedure: CATARACT EXTRACTION PHACO AND INTRAOCULAR LENS PLACEMENT (IOC);  Surgeon: Tonny Branch, MD;  Location: [facility] ORS;  Service: Ophthalmology;  Laterality: Right;  CDE:  16.30  . CHOLECYSTECTOMY  02/2013  . EYE SURGERY    . IR FLUORO GUIDE CV LINE RIGHT  01/05/2019  . IR US GUIDE VASC ACCESS RIGHT  01/05/2019  . LAPAROSCOPIC NEPHRECTOMY Right 01/08/2019   Procedure: LAPAROSCOPIC RADICAL NEPHRECTOMY;  Surgeon: Raynelle Bring, MD;  Location: [facility] ORS;  Service: Urology;  Laterality: Right;  . PENILE PROSTHESIS IMPLANT    . TOE AMPUTATION Right 2013   [facility]       Family History  Problem Relation Age of Onset  . Stroke Mother   . Stroke Father   . Cancer Father   . Cancer Brother     Social History   Tobacco Use  . Smoking status: Never Smoker  . Smokeless tobacco: Never Used  Vaping Use  . Vaping Use: Never used  Substance Use Topics  . Alcohol use: Yes    Comment: social drink   . Drug use: No    Home Medications Prior to Admission medications   Medication Sig Start Date End Date Taking? Authorizing Provider  atorvastatin (LIPITOR) 20 MG tablet Take 20 mg by mouth daily.    [provider]  blood glucose meter kit and supplies KIT Dispense based on  patient and insurance preference. Use up to four times daily as directed. (FOR ICD-9 250.00, 250.01). 11/08/15   Cassandria Anger, MD  Blood Glucose Monitoring Suppl (BAYER CONTOUR MONITOR) w/Device KIT 1 each by Does not apply route 4 (four) times daily. Test 4 x daily 11/08/15   Cassandria Anger, MD  clonazePAM (KLONOPIN) 0.5 MG tablet Take 0.5 mg by mouth daily as needed for anxiety.    [provider]  cloNIDine (CATAPRES) 0.2 MG tablet Take 0.2 mg by mouth 2 (two) times daily.    [provider]  escitalopram (LEXAPRO) 10 MG tablet Take 10 mg by mouth daily.    [provider]  glucose blood (BAYER CONTOUR TEST) test strip Use as instructed 4 x daily 11/08/15   Cassandria Anger, MD  hydrALAZINE (APRESOLINE) 100 MG tablet Take 100 mg by mouth 3 (three) times daily.    [provider]  HYDROcodone-acetaminophen (NORCO/VICODIN) 5-325 MG tablet Take 1-2 tablets by mouth every 6 (six) hours as needed. 01/09/19   Raynelle Bring, MD  Insulin Glargine [facility] KWIKPEN) 100 UNIT/ML SOPN Inject 30-50 Units into the skin at bedtime.    [provider]  Insulin Pen Needle (B-D ULTRAFINE III SHORT PEN) 31G X 8 MM MISC 1 each by Does not apply route as directed. 09/02/15   Cassandria Anger, MD  Iron-FA-B Cmp-C-Biot-Probiotic (FUSION PLUS) CAPS Take 1 tablet by mouth daily. 11/20/18   [provider]  Lancets MISC 1 each by Does not apply route 4 (four) times daily. 11/08/15   Cassandria Anger, MD  metoprolol succinate (TOPROL-XL) 25 MG 24 hr tablet Take 25 mg by mouth daily.    [provider]  sitaGLIPtin (JANUVIA) 50 MG tablet Take 50 mg by mouth daily.    [provider]  sodium bicarbonate 650 MG tablet Take 650 mg by mouth 2 (two) times daily.    [provider]    Allergies    Patient has no known allergies.  Review of Systems   Review of Systems  Constitutional: Negative for chills and fever.  HENT: Negative.   Respiratory: Positive for shortness of breath. Negative for cough.   Cardiovascular: Positive for leg swelling. Negative for chest pain.  Gastrointestinal: Positive for diarrhea and nausea. Negative for abdominal pain, blood in stool and vomiting.  Genitourinary: Negative for dysuria.  Musculoskeletal: Negative for arthralgias and myalgias.  Skin: Positive for wound (Chronic wound to left foot). Negative for color change and rash.  Neurological: Positive for weakness (Generalized). Negative for syncope, light-headedness and headaches.  All other systems reviewed and are  negative.   Physical Exam Updated Vital Signs BP (!) 99/48   Pulse 73   Temp 98.3 F (36.8 C)   Resp (!) 23   SpO2 97%   Physical Exam Vitals and nursing note reviewed.  Constitutional:      General: He is not in acute distress.    Appearance: He is well-developed. He is ill-appearing. He is not diaphoretic.     Comments: Alert, but ill-appearing, pt is pale, not in acute distress  HENT:     Head: Normocephalic and atraumatic.  Eyes:     General:        Right eye: No discharge.        Left eye: No discharge.     Pupils: Pupils are equal, round, and reactive to light.  Cardiovascular:     Rate and Rhythm: Normal rate and regular rhythm.  Heart sounds: Normal heart sounds.  Pulmonary:     Effort: Pulmonary effort is normal. No respiratory distress.     Breath sounds: Rales present. No wheezing.     Comments: Normal effort at rest, worsened with talking or any activity, with talking sats dipped into upper 80s on room air, on auscultation decreased air movement with rales in bilateral bases Abdominal:     General: Bowel sounds are normal. There is no distension.     Palpations: Abdomen is soft. There is no mass.     Tenderness: There is no abdominal tenderness. There is no guarding.     Comments: Abdomen soft, nondistended, nontender to palpation in all quadrants without guarding or peritoneal signs   Musculoskeletal:        General: No deformity.     Cervical back: Neck supple.     Right lower leg: Edema present.     Left lower leg: Edema present.     Comments: Bilateral lower extremity edema, chronic appearing wound to the left foot with some surrounding erythema  Skin:    General: Skin is warm and dry.     Capillary Refill: Capillary refill takes less than 2 seconds.  Neurological:     Mental Status: He is alert.     Coordination: Coordination normal.     Comments: Speech is clear, able to follow commands Moves extremities without ataxia, coordination intact   Psychiatric:        Mood and Affect: Mood normal.        Behavior: Behavior normal.     ED Results / Procedures / Treatments   Labs (all labs ordered are listed, but only abnormal results are displayed) Labs Reviewed  BASIC METABOLIC PANEL - Abnormal; Notable for the following components:      Result Value   Glucose, Bld 106 (*)    BUN 82 (*)    Creatinine, Ser 9.76 (*)    Calcium 8.7 (*)    GFR, Estimated 5 (*)    All other components within normal limits  CBC - Abnormal; Notable for the following components:   RBC 2.93 (*)    Hemoglobin 9.1 (*)    HCT 29.0 (*)    Platelets 109 (*)    All other components within normal limits  BRAIN NATRIURETIC PEPTIDE - Abnormal; Notable for the following components:   B Natriuretic Peptide 4,239.3 (*)    All other components within normal limits  TROPONIN I (HIGH SENSITIVITY) - Abnormal; Notable for the following components:   Troponin I (High Sensitivity) 1,294 (*)    All other components within normal limits  TROPONIN I (HIGH SENSITIVITY) - Abnormal; Notable for the following components:   Troponin I (High Sensitivity) 1,335 (*)    All other components within normal limits  RESP PANEL BY RT-PCR (FLU A&B, COVID) [id number]  HEPATIC FUNCTION PANEL    EKG EKG Interpretation  Date/Time:  Monday April 10 2021 10:30:25 EDT Ventricular Rate:  73 PR Interval:  164 QRS Duration: 96 QT Interval:  408 QTC Calculation: 449 R Axis:   9 Text Interpretation: Normal sinus rhythm Possible Left atrial enlargement Possible Anterior infarct , age undetermined new ST & T wave abnormality, consider lateral ischemia since 2/20 Confirmed by Blanchie Dessert [phone]) on 04/10/2021 10:48:14 AM   Radiology DG Chest 2 View  Result Date: 04/10/2021 CLINICAL DATA:  Shortness of breath EXAM: CHEST - 2 VIEW COMPARISON:  04/13/2020 FINDINGS: Prior CABG. Mild cardiomegaly and vascular congestion. Bibasilar atelectasis. Suspect  small effusions. No acute bony  abnormality. IMPRESSION: Mild cardiomegaly, vascular congestion. Bibasilar atelectasis with small effusions. Electronically Signed   By: Rolm Baptise M.D.   On: 04/10/2021 11:00    Procedures .Critical Care Performed by: Jacqlyn Larsen, PA-C Authorized by: Jacqlyn Larsen, PA-C   Critical care provider statement:    Critical care time (minutes):  45   Critical care was necessary to treat or prevent imminent or life-threatening deterioration of the following conditions:  Cardiac failure (elevated troponin)   Critical care was time spent personally by me on the following activities:  Discussions with consultants, evaluation of patient's response to treatment, examination of patient, ordering and performing treatments and interventions, ordering and review of laboratory studies, ordering and review of radiographic studies, pulse oximetry, re-evaluation of patient's condition, obtaining history from patient or surrogate and review of old charts     Medications Ordered in ED Medications  Chlorhexidine Gluconate Cloth 2 % PADS 6 each (has no administration in time range)    ED Course  I have reviewed the triage vital signs and the nursing notes.  Pertinent labs & imaging results that were available during my care of the patient were reviewed by me and considered in my medical decision making (see chart for details).    MDM Rules/Calculators/A&P                         65 y.o. male presents to the ED with complaints of worsening dyspnea on exertion and generalized weakness, this involves an extensive number of treatment options, and is a complaint that carries with it a high risk of complications and morbidity.  The differential diagnosis includes CHF, fluid overload in setting of ERSD, pneumonia, ACS, PE  On arrival pt is ill-appearing, satting well on room air but with talking or any activity becomes winded, sats drop to upper 80s, SBP in 90s-100s. Exam significant for lower extremity edema,  and rales in lung bases  Additional history obtained from chart review. Previous records obtained and reviewed via care everywhere   Lab Tests:  I Ordered, reviewed, and interpreted labs, which included:  CBC: No leukocytosis, chronic anemia with hgb at baseline when compared to labs at [facility] BMP: no hyperkalemia or other electrolyte derangement, Cr 9.76, BUN 82, uremia could also be contributing to symptoms. Trop:  1294, significantly elevated when compared to prior at [facility], continues to deny any chest pain BNP: 4239.3  Imaging Studies ordered:  I ordered imaging studies which included CXR, I independently visualized and interpreted imaging which showed cardiomegaly with pulmonary vascular congestion, heart enlarged when compared to prior CXR, concern for heart failure  ED Course:   I consulted Dr. Acie Fredrickson with cardiology and discussed EKG changes and troponin of 1293, has not had any chest pain, and repeat EKG remains unchanged. Concern for potential worsening heart function. Cardiology will see patient, but request medicine admission. No heparin at this time  Case also discussed with Nephrology, Dr. Arty Baumgartner will see patient to initiate dialysis while admitted.  Patient with some sift blood pressure in the 90s, BP currently being measured on the right wrist due to extremity restriction and continuous glucose monitor on upper arm, concern BPs may not be reliable, but improving now to 100s, will monitor closely.  Case discussed with internal medicine teaching service who will see and admit the patient.   Patient discussed with Dr. Maryan Rued, who saw patient as well and agrees with plan  Portions of this note were generated with Lobbyist. Dictation errors may occur despite best attempts at proofreading.   Final Clinical Impression(s) / ED Diagnoses Final diagnoses:  Dyspnea on exertion  ESRD on hemodialysis [facility])  Elevated troponin    Rx / DC Orders ED Discharge  Orders    None       Janet Berlin 04/11/21 2145    Blanchie Dessert, MD 04/13/21 [phone]

## 2021-04-10 NOTE — Consult Note (Signed)
Cardiology Consultation:   Patient ID: Maxwell Harmon MRN: [id number]; DOB: 07/14/1956  Admit date: 04/10/2021 Date of Consult: 04/10/2021  PCP:  [name], [facility] Cardiologist: Ricarda Frame - Quillian Quince, MD  [facility]  Click here to update MD or APP on Care Team, Refresh:1}     Patient Profile:   Maxwell Harmon is a 65 y.o. male with a hx of  DM, peripheral arterial disease, coronary artery disease status post CABG who is being seen 04/10/2021 for the evaluation of worsening shortness of breath and generalized weakness for the past month at the request of Dr. Maryan Rued.  History of Present Illness:   Maxwell Harmon is a 65 year old gentleman who is typically followed at [facility].  He has a long history of diabetes that as not been well controlled.  He has had renal issues for the past 7 or 8 years and has had end-stage renal disease for the past 6 or 7 years.  Last year he was having a work-up for renal transplant.  His stress Myoview was abnormal.  Subsequent heart catheterization revealed three-vessel coronary artery disease and he had coronary artery bypass grafting.  He has not had any issues with chest pain or shortness of breath since that time.  For the past month has had progressive weakness, fatigue, shortness of breath.  He has been so weak that it is been difficult for him to get up out of the chair.  He has had a poorly healing ulcer on the heel of his left foot.  He has not been exercising because it causes him lots of significant pain when he walks.  Because of his generalized weakness his son-in-law Nedra Hai, MD)  persuaded him to come to the emergency room.    He denies any angina.  His main complaint is that of profound fatigue.  He is compliant with his meds.   Tries to limit his salt intake   Past Medical History:  Diagnosis Date  . Diabetes mellitus without complication ([place])   . History of bleeding ulcers   . History  of TIAs   . Hypertension   . Low kidney function     Past Surgical History:  Procedure Laterality Date  . CATARACT EXTRACTION W/PHACO Left 02/15/2014   Procedure: CATARACT EXTRACTION PHACO AND INTRAOCULAR LENS PLACEMENT (IOC);  Surgeon: Tonny Branch, MD;  Location: [facility];  Service: Ophthalmology;  Laterality: Left;  CDE 10.84  . CATARACT EXTRACTION W/PHACO Right 12/14/2013   Procedure: CATARACT EXTRACTION PHACO AND INTRAOCULAR LENS PLACEMENT (IOC);  Surgeon: Tonny Branch, MD;  Location: [facility]S;  Service: Ophthalmology;  Laterality: Right;  CDE:  16.30  . CHOLECYSTECTOMY  02/2013  . EYE SURGERY    . IR FLUORO GUIDE CV LINE RIGHT  01/05/2019  . IR US GUIDE VASC ACCESS RIGHT  01/05/2019  . LAPAROSCOPIC NEPHRECTOMY Right 01/08/2019   Procedure: LAPAROSCOPIC RADICAL NEPHRECTOMY;  Surgeon: Raynelle Bring, MD;  Location: [facility];  Service: Urology;  Laterality: Right;  . PENILE PROSTHESIS IMPLANT    . TOE AMPUTATION Right 2013   [facility]     Home Medications:  Prior to Admission medications   Medication Sig Start Date End Date Taking? Authorizing Provider  atorvastatin (LIPITOR) 20 MG tablet Take 20 mg by mouth daily.    [provider]  blood glucose meter kit and supplies KIT Dispense based on patient and insurance preference. Use up to four times daily as  directed. (FOR ICD-9 250.00, 250.01). 11/08/15   Cassandria Anger, MD  Blood Glucose Monitoring Suppl (BAYER CONTOUR MONITOR) w/Device KIT 1 each by Does not apply route 4 (four) times daily. Test 4 x daily 11/08/15   Cassandria Anger, MD  clonazePAM (KLONOPIN) 0.5 MG tablet Take 0.5 mg by mouth daily as needed for anxiety.    [provider]  cloNIDine (CATAPRES) 0.2 MG tablet Take 0.2 mg by mouth 2 (two) times daily.    [provider]  escitalopram (LEXAPRO) 10 MG tablet Take 10 mg by mouth daily.    [provider]  glucose blood (BAYER CONTOUR TEST) test strip Use as instructed 4 x daily  11/08/15   Cassandria Anger, MD  hydrALAZINE (APRESOLINE) 100 MG tablet Take 100 mg by mouth 3 (three) times daily.    [provider]  HYDROcodone-acetaminophen (NORCO/VICODIN) 5-325 MG tablet Take 1-2 tablets by mouth every 6 (six) hours as needed. 01/09/19   Raynelle Bring, MD  Insulin Glargine [facility] KWIKPEN) 100 UNIT/ML SOPN Inject 30-50 Units into the skin at bedtime.    [provider]  Insulin Pen Needle (B-D ULTRAFINE III SHORT PEN) 31G X 8 MM MISC 1 each by Does not apply route as directed. 09/02/15   Cassandria Anger, MD  Iron-FA-B Cmp-C-Biot-Probiotic (FUSION PLUS) CAPS Take 1 tablet by mouth daily. 11/20/18   [provider]  Lancets MISC 1 each by Does not apply route 4 (four) times daily. 11/08/15   Cassandria Anger, MD  metoprolol succinate (TOPROL-XL) 25 MG 24 hr tablet Take 25 mg by mouth daily.    [provider]  sitaGLIPtin (JANUVIA) 50 MG tablet Take 50 mg by mouth daily.    [provider]  sodium bicarbonate 650 MG tablet Take 650 mg by mouth 2 (two) times daily.    [provider]    Inpatient Medications: Scheduled Meds:  Continuous Infusions:  PRN Meds:   Allergies:   No Known Allergies  Social History:   Social History   Socioeconomic History  . Marital status: Married    Spouse name: Not on file  . Number of children: Not on file  . Years of education: Not on file  . Highest education level: Not on file  Occupational History  . Not on file  Tobacco Use  . Smoking status: Never Smoker  . Smokeless tobacco: Never Used  Vaping Use  . Vaping Use: Never used  Substance and Sexual Activity  . Alcohol use: Yes    Comment: social drink   . Drug use: No  . Sexual activity: Not on file  Other Topics Concern  . Not on file  Social History Narrative  . Not on file   Social Determinants of Health   Financial Resource Strain: Not on file  Food Insecurity: Not on file  Transportation  Needs: Not on file  Physical Activity: Not on file  Stress: Not on file  Social Connections: Not on file  Intimate Partner Violence: Not on file    Family History:    Family History  Problem Relation Age of Onset  . Stroke Mother   . Stroke Father   . Cancer Father   . Cancer Brother      ROS:  Please see the history of present illness.   All other ROS reviewed and negative.     Physical Exam/Data:   Vitals:   04/10/21 1033 04/10/21 1130 04/10/21 1245 04/10/21 1300  BP: 132/70 Marland Kitchen)  99/48 (!) 96/33 (!) 95/54  Pulse: 73 73 72 68  Resp: 16 (!) 23 (!) 22 (!) 21  Temp: 98.3 F (36.8 C)     SpO2: 97% 97% 100% 99%   No intake or output data in the 24 hours ending 04/10/21 1406 Last 3 Weights 01/11/2019 01/10/2019 01/09/2019  Weight (lbs) 195 lb 15.8 oz 198 lb 6.6 oz 194 lb 3.6 oz  Weight (kg) 88.9 kg 90 kg 88.1 kg     There is no height or weight on file to calculate BMI.  General: middle age male,   Alert,   Responds somewhat slower than I would expect   HEENT: normal Lymph: no adenopathy Neck: no JVD Endocrine:  No thryomegaly Vascular: soft left carotid bruit,  FA pulses 2+ bilaterally without bruits  Cardiac:  normal S1, S2;, soft systolic murmur  Lungs:  clear to auscultation bilaterally, no wheezing, rhonchi or rales  Abd: soft, nontender, no hepatomegaly  Ext: 2+ bilateral leg and foot edema.  Non-healing ulcer on heel of left foot Musculoskeletal:  Generalized weakness  Skin: warm and dry  Neuro:  CNs 2-12 intact, no focal abnormalities noted Psych:  Normal affect   EKG:   April 10, 2021: Normal sinus rhythm at 73.  He has new T wave inversions in leads I and aVL.   Telemetry:   NSR   Relevant CV Studies:   Laboratory Data:  High Sensitivity Troponin:   Recent Labs  Lab 04/10/21 1033  TROPONINIHS 1,294*     Chemistry Recent Labs  Lab 04/10/21 1033  NA 136  K 4.5  CL 98  CO2 23  GLUCOSE 106*  BUN 82*  CREATININE 9.76*  CALCIUM 8.7*  GFRNONAA  5*  ANIONGAP 15    No results for input(s): PROT, ALBUMIN, AST, ALT, ALKPHOS, BILITOT in the last 168 hours. Hematology Recent Labs  Lab 04/10/21 1033  WBC 5.9  RBC 2.93*  HGB 9.1*  HCT 29.0*  MCV 99.0  MCH 31.1  MCHC 31.4  RDW 14.9  PLT 109*   BNPNo results for input(s): BNP, PROBNP in the last 168 hours.  DDimer No results for input(s): DDIMER in the last 168 hours.   Radiology/Studies:  DG Chest 2 View  Result Date: 04/10/2021 CLINICAL DATA:  Shortness of breath EXAM: CHEST - 2 VIEW COMPARISON:  04/13/2020 FINDINGS: Prior CABG. Mild cardiomegaly and vascular congestion. Bibasilar atelectasis. Suspect small effusions. No acute bony abnormality. IMPRESSION: Mild cardiomegaly, vascular congestion. Bibasilar atelectasis with small effusions. Electronically Signed   By: Rolm Baptise M.D.   On: 04/10/2021 11:00     Assessment and Plan:   1. Non ST elevation MI:  pt presents with a month long history of generalized fatigue, weakness, increased shortness of breath.  He has never had any angina.  Of note is that he really did not have angina even prior to his bypass grafting.  He now presents with new T wave inversions in the lateral leads.  His troponin level is elevated (although this is in the setting of end-stage renal disease and a creatinine of around 10.)  Will get an echocardiogram to assess his LV function.  I would have a low threshold to do a heart catheterization on him.  2.   Leg edema :  Will get venous duplex.   Is is very inactive and is at risk for DVT / PE \   3.  ESRD:   Plans per nephrology   4. Progressive fatigue:  No clear etiology at this point .  Further recs per IM     New York Heart Association (NYHA) Functional Class NYHA Class III            For questions or updates, please contact [facility] Please consult [email or web address] for contact info under    Signed, Mertie Moores, MD  04/10/2021 2:06 PM

## 2021-04-10 NOTE — Consult Note (Signed)
[facility] Renal Consultation Note    Indication for Consultation:  Management of ESRD/hemodialysis; anemia, hypertension/volume and secondary hyperparathyroidism  HPI: Maxwell Harmon is a 65 y.o. male with a PMH significant for DM, HTN, CAD s/p CABG, ESRD on CCPD, h/o TIA's, and right charcot joint who presented to [facility] ED with a 1 week history of SOB, malaise, and generalized weakness.  He has been having issues with his PD machine with delayed draining so was set up for a session of HD today, however he did not feel well and came here instead.  He also has been having pain of his left foot with drainage from his heel and redness around his great toe and forefoot.  He was supposed to see a foot specialist at [facility] today but he did not feel well.    He has been having some orthopnea and DOE for the past week as well.  He denies any CP, N/V/D.   Past Medical History:  Diagnosis Date  . Diabetes mellitus without complication ([place])   . History of bleeding ulcers   . History of TIAs   . Hypertension   . Low kidney function    Past Surgical History:  Procedure Laterality Date  . CATARACT EXTRACTION W/PHACO Left 02/15/2014   Procedure: CATARACT EXTRACTION PHACO AND INTRAOCULAR LENS PLACEMENT (IOC);  Surgeon: Tonny Branch, MD;  Location: [facility];  Service: Ophthalmology;  Laterality: Left;  CDE 10.84  . CATARACT EXTRACTION W/PHACO Right 12/14/2013   Procedure: CATARACT EXTRACTION PHACO AND INTRAOCULAR LENS PLACEMENT (IOC);  Surgeon: Tonny Branch, MD;  Location: [facility];  Service: Ophthalmology;  Laterality: Right;  CDE:  16.30  . CHOLECYSTECTOMY  02/2013  . EYE SURGERY    . IR FLUORO GUIDE CV LINE RIGHT  01/05/2019  . IR US GUIDE VASC ACCESS RIGHT  01/05/2019  . LAPAROSCOPIC NEPHRECTOMY Right 01/08/2019   Procedure: LAPAROSCOPIC RADICAL NEPHRECTOMY;  Surgeon: Raynelle Bring, MD;  Location: [facility];  Service: Urology;  Laterality: Right;  . PENILE PROSTHESIS IMPLANT    . TOE AMPUTATION Right  2013   little toe-[facility]   Family History:   Family History  Problem Relation Age of Onset  . Stroke Mother   . Stroke Father   . Cancer Father   . Cancer Brother    Social History:  reports that he has never smoked. He has never used smokeless tobacco. He reports current alcohol use. He reports that he does not use drugs. No Known Allergies Prior to Admission medications   Medication Sig Start Date End Date Taking? Authorizing Provider  atorvastatin (LIPITOR) 20 MG tablet Take 20 mg by mouth daily.    [provider]  blood glucose meter kit and supplies KIT Dispense based on patient and insurance preference. Use up to four times daily as directed. (FOR ICD-9 250.00, 250.01). 11/08/15   Cassandria Anger, MD  Blood Glucose Monitoring Suppl (BAYER CONTOUR MONITOR) w/Device KIT 1 each by Does not apply route 4 (four) times daily. Test 4 x daily 11/08/15   Cassandria Anger, MD  clonazePAM (KLONOPIN) 0.5 MG tablet Take 0.5 mg by mouth daily as needed for anxiety.    [provider]  cloNIDine (CATAPRES) 0.2 MG tablet Take 0.2 mg by mouth 2 (two) times daily.    [provider]  escitalopram (LEXAPRO) 10 MG tablet Take 10 mg by mouth daily.    [provider]  glucose blood (BAYER CONTOUR TEST) test strip Use as  instructed 4 x daily 11/08/15   Cassandria Anger, MD  hydrALAZINE (APRESOLINE) 100 MG tablet Take 100 mg by mouth 3 (three) times daily.    [provider]  HYDROcodone-acetaminophen (NORCO/VICODIN) 5-325 MG tablet Take 1-2 tablets by mouth every 6 (six) hours as needed. 01/09/19   Raynelle Bring, MD  Insulin Glargine [facility] KWIKPEN) 100 UNIT/ML SOPN Inject 30-50 Units into the skin at bedtime.    [provider]  Insulin Pen Needle (B-D ULTRAFINE III SHORT PEN) 31G X 8 MM MISC 1 each by Does not apply route as directed. 09/02/15   Cassandria Anger, MD  Iron-FA-B Cmp-C-Biot-Probiotic (FUSION PLUS) CAPS Take 1  tablet by mouth daily. 11/20/18   [provider]  Lancets MISC 1 each by Does not apply route 4 (four) times daily. 11/08/15   Cassandria Anger, MD  metoprolol succinate (TOPROL-XL) 25 MG 24 hr tablet Take 25 mg by mouth daily.    [provider]  sitaGLIPtin (JANUVIA) 50 MG tablet Take 50 mg by mouth daily.    [provider]  sodium bicarbonate 650 MG tablet Take 650 mg by mouth 2 (two) times daily.    [provider]   Current Facility-Administered Medications  Medication Dose Route Frequency Provider Last Rate Last Admin  . [START ON 04/11/2021] Chlorhexidine Gluconate Cloth 2 % PADS 6 each  6 each Topical Q0600 Donato Heinz, MD       Current Outpatient Medications  Medication Sig Dispense Refill  . atorvastatin (LIPITOR) 20 MG tablet Take 20 mg by mouth daily.    . blood glucose meter kit and supplies KIT Dispense based on patient and insurance preference. Use up to four times daily as directed. (FOR ICD-9 250.00, 250.01). 1 each 0  . Blood Glucose Monitoring Suppl (BAYER CONTOUR MONITOR) w/Device KIT 1 each by Does not apply route 4 (four) times daily. Test 4 x daily 1 kit 0  . clonazePAM (KLONOPIN) 0.5 MG tablet Take 0.5 mg by mouth daily as needed for anxiety.    . cloNIDine (CATAPRES) 0.2 MG tablet Take 0.2 mg by mouth 2 (two) times daily.    Marland Kitchen escitalopram (LEXAPRO) 10 MG tablet Take 10 mg by mouth daily.    Marland Kitchen glucose blood (BAYER CONTOUR TEST) test strip Use as instructed 4 x daily 150 each 5  . hydrALAZINE (APRESOLINE) 100 MG tablet Take 100 mg by mouth 3 (three) times daily.    Marland Kitchen HYDROcodone-acetaminophen (NORCO/VICODIN) 5-325 MG tablet Take 1-2 tablets by mouth every 6 (six) hours as needed. 15 tablet 0  . Insulin Glargine (BASAGLAR KWIKPEN) 100 UNIT/ML SOPN Inject 30-50 Units into the skin at bedtime.    . Insulin Pen Needle (B-D ULTRAFINE III SHORT PEN) 31G X 8 MM MISC 1 each by Does not apply route as directed. 100 each 3  . Iron-FA-B  Cmp-C-Biot-Probiotic (FUSION PLUS) CAPS Take 1 tablet by mouth daily.    . Lancets MISC 1 each by Does not apply route 4 (four) times daily. 150 each 5  . metoprolol succinate (TOPROL-XL) 25 MG 24 hr tablet Take 25 mg by mouth daily.    . sitaGLIPtin (JANUVIA) 50 MG tablet Take 50 mg by mouth daily.    . sodium bicarbonate 650 MG tablet Take 650 mg by mouth 2 (two) times daily.     Labs: Basic Metabolic Panel: Recent Labs  Lab 04/10/21 1033  NA 136  K 4.5  CL 98  CO2 23  GLUCOSE 106*  BUN 82*  CREATININE 9.76*  CALCIUM 8.7*   Liver Function Tests: No results for input(s): AST, ALT, ALKPHOS, BILITOT, PROT, ALBUMIN in the last 168 hours. No results for input(s): LIPASE, AMYLASE in the last 168 hours. No results for input(s): AMMONIA in the last 168 hours. CBC: Recent Labs  Lab 04/10/21 1033  WBC 5.9  HGB 9.1*  HCT 29.0*  MCV 99.0  PLT 109*   Cardiac Enzymes: No results for input(s): CKTOTAL, CKMB, CKMBINDEX, TROPONINI in the last 168 hours. CBG: No results for input(s): GLUCAP in the last 168 hours. Iron Studies: No results for input(s): IRON, TIBC, TRANSFERRIN, FERRITIN in the last 72 hours. Studies/Results: DG Chest 2 View  Result Date: 04/10/2021 CLINICAL DATA:  Shortness of breath EXAM: CHEST - 2 VIEW COMPARISON:  04/13/2020 FINDINGS: Prior CABG. Mild cardiomegaly and vascular congestion. Bibasilar atelectasis. Suspect small effusions. No acute bony abnormality. IMPRESSION: Mild cardiomegaly, vascular congestion. Bibasilar atelectasis with small effusions. Electronically Signed   By: Rolm Baptise M.D.   On: 04/10/2021 11:00    ROS: Pertinent items are noted in HPI. Physical Exam: Vitals:   04/10/21 1300 04/10/21 1330 04/10/21 1400 04/10/21 1554  BP: (!) 95/54 (!) 100/36 (!) 111/56 96/68  Pulse: 68 70 67 70  Resp: (!) 21 10 (!) 25 18  Temp:      SpO2: 99% 95% 100% 94%      Weight change:  No intake or output data in the 24 hours ending 04/10/21 1555 BP 96/68    Pulse 70   Temp 98.3 F (36.8 C)   Resp 18   SpO2 94%  General appearance: fatigued and no distress Head: Normocephalic, without obvious abnormality, atraumatic Resp: rales bibasilar Cardio: regular rate and rhythm and no rub GI: soft, non-tender; bowel sounds normal; no masses,  no organomegaly and PD catheter in place, no drainage Extremities: edema 1+ bilateral lower extremity, pitting edema and ulcer along the medial aspect of his left heel with sanguinous drainage, area of ulceration on left great toe with surrounding erythema, also with large ecchymosis of left arm Dialysis Access: PD catheter and LUE AVF +T/B  Dialysis Orders: Center: [facility], [place]  on CCPD with 3 exchanges, 2700 ml fill volume, fill time 30 minutes, drain time 30 minutes, dwell time 2 hours.  NEPHROLOGIST: Claris Gladden  LOCATION: [name]  SCHEDULE: M-W-F 2nd Shift  EDW: TBD kg.  KIDNEY:  LITERS PROC: liters/treatment  HD TIME: 4 hrs. 0 min.  ACCESS: L Upper Arm AVF  NEEDLE SIZE: 15 ga.  ANTICOAG: Standard Heparin Per Protocol  BATH: 2.0 K-2.5Ca 35 HCO3  QB: 500 ml/min  QD: Auto Flow Protocol    Assessment/Plan: 1. NSTEMI - Cardiology consulted and has EKG changes.  ECHO ordered.  2. SOB - pt with evidence of volume overload likely due to issues with PD catheter and machine not draining.  Was to have IHD session today in [place] but did not feel well so came here.  BNP markedly elevated and CXR with pulmonary edema.  Will plan for urgent HD today and UF as bp tolerates. 3.  ESRD -  As above, will plan for HD today and maybe transition to CCPD tomorrow.  He has been having issues with draining his PD fluid with the machine and has been doing manuel exchanges for the past few days.  Will start miralax as he reports hard stools and may be mechanical.  Will attempt CCPD tomorrow if he is willing  to try again. 4.  Hypertension/volume  - volume overload but low BP,  will UF as tolerated.  Concerning for early sepsis given diabetic foot ulcer.  Would recommend blood cultures.  Will draw a set with HD today.  5.  Anemia  - due to ESRD.  Will dose with ESA and transfuse prn. 6.  Metabolic bone disease -  Continue with home meds 7.  Nutrition -  Renal diet, carb modified 8. Vascular access:  LUE AVF +T/B  Donetta Potts, MD [place] Pager [phone] 04/10/2021, 3:55 PM

## 2021-04-10 NOTE — H&P (View-Only) (Signed)
[facility] [place]  ASSESSMENT / PLAN: Maxwell Harmon is a 65 y.o. male with atherosclerosis of native arteries of left lower extremity causing ulceration.  Patient counseled patients with chronic limb threatening ischemia have an annual risk of cardiovascular mortality of 25% and a annual amputation risk of 25%. Aggressive risk factor modification and intervention for limb salvage is indicated.  Recommend the following which can slow the progression of atherosclerosis and reduce the risk of major adverse cardiac / limb events:  Complete cessation from all tobacco products. Blood glucose control with goal A1c < 7%. Blood pressure control with goal blood pressure < 140/90 mmHg. Lipid reduction therapy with goal LDL-C <100 mg/dL (<70 if symptomatic from PAD).  Aspirin 81mg  PO QD.  Atorvastatin 40-80mg  PO QD (or other "high intensity" statin therapy). Will obtain baseline ABI  Plan left lower extremity angiogram with possible intervention via right common femoral artery access approach in cath lab once he has improved clinically. I have time to do this Wednesday, but I suspect he will not be medically optimized by this time. Will discuss with the primary service in the morning.   CHIEF COMPLAINT: weakness  HISTORY OF PRESENT ILLNESS: Maxwell Harmon is a 65 y.o. male who presents to [facility] ER for evaluation of weakness. His history is significant for CAD s/p CABG, ESRD on PD with recent transition to HD via LUE BC AVF, DM2.  He has been under the care of a podiatrist who has performed local wound care on two areas of ulceration of his left foot. These have been present for >5 months and are not healing. They are painful. He has no history of peripheral intervention.  Past Medical History:  Diagnosis Date  . Diabetes mellitus without complication ([place])   . History of bleeding ulcers   . History of TIAs   . Hypertension   . Low kidney function     Past Surgical  History:  Procedure Laterality Date  . CATARACT EXTRACTION W/PHACO Left 02/15/2014   Procedure: CATARACT EXTRACTION PHACO AND INTRAOCULAR LENS PLACEMENT (IOC);  Surgeon: Tonny Branch, MD;  Location: [facility];  Service: Ophthalmology;  Laterality: Left;  CDE 10.84  . CATARACT EXTRACTION W/PHACO Right 12/14/2013   Procedure: CATARACT EXTRACTION PHACO AND INTRAOCULAR LENS PLACEMENT (IOC);  Surgeon: Tonny Branch, MD;  Location: [facility];  Service: Ophthalmology;  Laterality: Right;  CDE:  16.30  . CHOLECYSTECTOMY  02/2013  . EYE SURGERY    . IR FLUORO GUIDE CV LINE RIGHT  01/05/2019  . IR US GUIDE VASC ACCESS RIGHT  01/05/2019  . LAPAROSCOPIC NEPHRECTOMY Right 01/08/2019   Procedure: LAPAROSCOPIC RADICAL NEPHRECTOMY;  Surgeon: Raynelle Bring, MD;  Location: [facility];  Service: Urology;  Laterality: Right;  . PENILE PROSTHESIS IMPLANT    . TOE AMPUTATION Right 2013   little toe-[facility]    Family History  Problem Relation Age of Onset  . Stroke Mother   . Stroke Father   . Cancer Father   . Cancer Brother     Social History   Socioeconomic History  . Marital status: Married    Spouse name: Not on file  . Number of children: Not on file  . Years of education: Not on file  . Highest education level: Not on file  Occupational History  . Not on file  Tobacco Use  . Smoking status: Never Smoker  . Smokeless tobacco: Never Used  Vaping Use  . Vaping Use: Never used  Substance and Sexual Activity  . Alcohol use: Yes    Comment: social drink   . Drug use: No  . Sexual activity: Not on file  Other Topics Concern  . Not on file  Social History Narrative  . Not on file   Social Determinants of Health   Financial Resource Strain: Not on file  Food Insecurity: Not on file  Transportation Needs: Not on file  Physical Activity: Not on file  Stress: Not on file  Social Connections: Not on file  Intimate Partner Violence: Not on file    No Known Allergies  Current Facility-Administered  Medications  Medication Dose Route Frequency Provider Last Rate Last Admin  . 0.9 %  sodium chloride infusion  100 mL Intravenous PRN Donato Heinz, MD      . 0.9 %  sodium chloride infusion  100 mL Intravenous PRN Donato Heinz, MD      . 0.9 %  sodium chloride infusion  250 mL Intravenous Continuous Corey Harold, NP      . alteplase (CATHFLO ACTIVASE) injection 2 mg  2 mg Intracatheter Once PRN Donato Heinz, MD      . Derrill Memo ON 04/11/2021] atorvastatin (LIPITOR) tablet 80 mg  80 mg Oral Daily Jose Persia, MD      . Derrill Memo ON 04/11/2021] Chlorhexidine Gluconate Cloth 2 % PADS 6 each  6 each Topical Q0600 Donato Heinz, MD      . docusate sodium (COLACE) capsule 100 mg  100 mg Oral BID PRN Corey Harold, NP      . heparin injection 1,000 Units  1,000 Units Dialysis PRN Donato Heinz, MD      . heparin injection 20 Units/kg  20 Units/kg Dialysis PRN Donato Heinz, MD      . heparin injection 5,000 Units  5,000 Units Subcutaneous Q8H Corey Harold, NP   5,000 Units at 04/10/21 2144  . [START ON 04/11/2021] insulin aspart (novoLOG) injection 0-6 Units  0-6 Units Subcutaneous TID WC Jose Persia, MD      . lidocaine (PF) (XYLOCAINE) 1 % injection 5 mL  5 mL Intradermal PRN Donato Heinz, MD      . lidocaine-prilocaine (EMLA) cream 1 application  1 application Topical PRN Donato Heinz, MD      . norepinephrine (LEVOPHED) 4mg  in 267mL premix infusion  2-10 mcg/min Intravenous Titrated Corey Harold, NP      . pantoprazole (PROTONIX) injection 40 mg  40 mg Intravenous QHS Corey Harold, NP   40 mg at 04/10/21 2145  . pentafluoroprop-tetrafluoroeth (GEBAUERS) aerosol 1 application  1 application Topical PRN Donato Heinz, MD      . Derrill Memo ON 04/11/2021] piperacillin-tazobactam (ZOSYN) IVPB 2.25 g  2.25 g Intravenous Q8H Mancheril, Darnell Level, RPH      . polyethylene glycol (MIRALAX / GLYCOLAX) packet 17 g  17 g Oral Daily PRN Corey Harold, NP       . vancomycin Alcus Dad) IVPB 1750 mg/350 mL  1,750 mg Intravenous Once Lavenia Atlas, RPH 175 mL/hr at 04/10/21 2122 1,750 mg at 04/10/21 2122  . vancomycin variable dose per unstable renal function (pharmacist dosing)   Does not apply See admin instructions Mancheril, Darnell Level, RPH        REVIEW OF SYSTEMS:  [X]  denotes positive finding, [ ]  denotes negative finding Cardiac  Comments:  Chest pain or chest pressure:    Shortness of breath upon exertion:    Short of breath when lying flat:  Irregular heart rhythm:        Vascular    Pain in calf, thigh, or hip brought on by ambulation:    Pain in feet at night that wakes you up from your sleep:     Blood clot in your veins:    Leg swelling:         Pulmonary    Oxygen at home:    Productive cough:     Wheezing:         Neurologic    Sudden weakness in arms or legs:     Sudden numbness in arms or legs:     Sudden onset of difficulty speaking or slurred speech:    Temporary loss of vision in one eye:     Problems with dizziness:         Gastrointestinal    Blood in stool:     Vomited blood:         Genitourinary    Burning when urinating:     Blood in urine:        Psychiatric    Major depression:         Hematologic    Bleeding problems:    Problems with blood clotting too easily:        Skin    Rashes or ulcers:        Constitutional    Fever or chills:      PHYSICAL EXAM  Vitals:   04/10/21 2000 04/10/21 2015 04/10/21 2030 04/10/21 2045  BP: (!) 119/40 (!) 106/43 112/61 (!) 111/53  Pulse: 77 76 74 77  Resp: (!) 23 (!) 25 (!) 29 15  Temp:      SpO2: 100% 92% 92% 98%    Constitutional: chronically ill appearing. No distress. Appears well nourished.  Neurologic: CN intact. no focal findings. no sensory loss. Psychiatric: Mood and affect symmetric and appropriate. Eyes: No icterus. No conjunctival pallor. Ears, nose, throat: mucous membranes moist. Midline trachea.  Cardiac: regular rate  and rhythm.  Respiratory: unlabored. Abdominal: soft, non-tender, non-distended.  Peripheral vascular:  LUE brachiocephalic arteriovenous fistula with good thrill  1+ femoral pulses bilaterally  No palpable popliteal pulses  No palpable pedal pulses  Biphasic doppler flow in PT bilaterally  Left lateral heel unstageable ulceration about a half dollar in size  Small <1cm left first PIP joint unstageable ulcer Extremity: No edema. No cyanosis. No pallor.  Skin: as above Lymphatic: No Stemmer's sign. No palpable lymphadenopathy.  PERTINENT LABORATORY AND RADIOLOGIC DATA  Most recent CBC CBC Latest Ref Rng & Units 04/10/2021 01/11/2019 01/10/2019  WBC 4.0 - 10.5 K/uL 5.9 7.2 -  Hemoglobin 13.0 - 17.0 g/dL 9.1(L) 7.4(L) 7.6(L)  Hematocrit 39.0 - 52.0 % 29.0(L) 23.5(L) 24.3(L)  Platelets 150 - 400 K/uL 109(L) 112(L) -     Most recent CMP CMP Latest Ref Rng & Units 04/10/2021 01/11/2019 01/10/2019  Glucose 70 - 99 mg/dL 106(H) 129(H) -  BUN 8 - 23 mg/dL 82(H) 78(H) -  Creatinine 0.61 - 1.24 mg/dL 9.76(H) 6.22(H) 6.32(H)  Sodium 135 - 145 mmol/L 136 133(L) -  Potassium 3.5 - 5.1 mmol/L 4.5 4.9 -  Chloride 98 - 111 mmol/L 98 106 -  CO2 22 - 32 mmol/L 23 18(L) -  Calcium 8.9 - 10.3 mg/dL 8.7(L) 7.9(L) -  Total Protein 6.5 - 8.1 g/dL 5.5(L) - -  Total Bilirubin 0.3 - 1.2 mg/dL 0.7 - -  Alkaline Phos 38 - 126 U/L 86 - -  AST 15 - 41 U/L 23 - -  ALT 0 - 44 U/L 15 - -    Renal function CrCl cannot be calculated (Unknown ideal weight.).  Hemoglobin A1C (no units)  Date Value  11/08/2015 7.5   Hgb A1c MFr Bld (%)  Date Value  01/02/2019 6.9 (H)    LDL Cholesterol  Date Value Ref Range Status  04/10/2021 74 0 - 99 mg/dL Final    Comment:           Total Cholesterol/HDL:CHD Risk Coronary Heart Disease Risk Table                     Men   Women  1/2 Average Risk   3.4   3.3  Average Risk       5.0   4.4  2 X Average Risk   9.6   7.1  3 X Average Risk  23.4   11.0        Use the  calculated Patient Ratio above and the CHD Risk Table to determine the patient's CHD Risk.        ATP III CLASSIFICATION (LDL):  <100     mg/dL   Optimal  100-129  mg/dL   Near or Above                    Optimal  130-159  mg/dL   Borderline  160-189  mg/dL   High  >190     mg/dL   Very High Performed at [facility] [address] [place], [place] [address]     Yevonne Aline. Stanford Breed, MD [facility] Phone Number: [phone] 04/10/2021 9:46 PM

## 2021-04-10 NOTE — H&P (Addendum)
NAME:  Maxwell Harmon, MRN:  [id number], DOB:  30-Dec-1955, LOS: 0 ADMISSION DATE:  04/10/2021, CONSULTATION DATE:  6/6 REFERRING MD:  Dareen Piano CHIEF COMPLAINT:  Confusion  History of Present Illness:  65 y/o male with an extensive past medical history presented to the [facility] ER on 6/6 with a chief complaint of confusion and weakness.  He has a history of CAD s/p CABG, ESRD on peritoneal dialysis and recently switched to intermittent hemodialysis (last week) because PD fluid was not draining appropriately.  Over the last several weeks he has complained of increasing confusion and weakness.  This has been associated with worsening orthopnea and dyspnea.  His wife notes that he has been more confused and lethargic than normal.  In the midst of this he has been having more left foot pain due to a diabetic foot and ulcer on his left heel.  He was started on neurontin but this was held because of worsening confusion (last dose over 2 weeks ago).  His wife notes that his left foot has been more red lately.    Today he denies recent chest pain, shortness of breath, fever or chills.    CABG performed at [facility] in 2021  Over the last few months he has been worked up for a renal transplant.  At [facility] he was noted to have an EF of 38-42% on a myoview study in 12/2020 which also showed a small area of reversible ischemia, however a stress echo in 02/2021 showed an EF of ~55%.   Pertinent  Medical History  CAD s/p CABG 2021 ESRD on peritoneal hemodialysis > now on hemodialysis Hypertension DM2 Charcot foot deformity  Significant Hospital Events: Including procedures, antibiotic start and stop dates in addition to other pertinent events   . 6/6 admission  Interim History / Subjective:  As above  Objective   Blood pressure (!) 108/49, pulse 71, temperature 98.3 F (36.8 C), resp. rate 18, SpO2 95 %.       No intake or output data in the 24 hours ending 04/10/21 1757 There were no vitals  filed for this visit.  Examination:  Gen: chronically ill appearing, no acute distress HENT: NCAT, OP clear, neck supple without masses Eyes: PERRL, EOMi Lymph: no cervical lymphadenopathy PULM: Few crackles bases B CV: RRR, systolic murmur LUSB noted, radiates to axilla, no JVD GI: BS+, soft, nontender, no hsm Derm: ulcer left heel, unstageable MSK: normal bulk and tone Neuro: confused but conversant, moves all four extremities, CN grossly intact, Psyche: normal mood and affect   Labs/imaging that I havepersonally reviewed  (right click and "Reselect all SmartList Selections" daily)  WBFC 5.9 BNP 4.2K Trop 1,335 Cr 9.7, BUN 82  Echo: LVEF 30%, calcification mitral/aortic valves, moderate MR  Resolved Hospital Problem list     Assessment & Plan:  Acute metabolic encephalopathy due to uremia: needs hemodialysis, could be complicated by occult infection given foot infection (see below) > admit to ICU > initiate hemodialysis > hold sedatives including home clonazepam, escitalopram, vicodin > check LFT's for completeness  Hypotension, based on exam more concerned about septic shock than cardiogenic (warm feet, likely infected left foot) Systolic heart failure NSTEMI > myocarditis? Leak from CHF? > if no improvement in mental status overnight with hemodialysis then place CVL, check CVP, coox > check lactic acid > check blood cultures > no IV fluids given volume overload status > start peripheral levophed titrated to MAP > 65 > low threshold to place CVL  Diabetic foot with infection: > ortho consult with Dr. Sharol Given > Vanc/Cefepime > wound care per pharmacy  ESRD > HD per renal  DM2 > SSI  Thrombocytopenia: unclear etiology > check DIC panel > monitor for bleeding  Normocytic anemia > Monitor for bleeding > Transfuse PRBC for Hgb < 7 gm/dL   Best practice (right click and "Reselect all SmartList Selections" daily)  Diet:  NPO Pain/Anxiety/Delirium protocol  (if indicated): No VAP protocol (if indicated): Not indicated DVT prophylaxis: Subcutaneous Heparin GI prophylaxis: N/A Glucose control:  SSI Yes Central venous access:  N/A Arterial line:  N/A Foley:  N/A Mobility:  bed rest  PT consulted: N/A Last date of multidisciplinary goals of care discussion [n/a] Code Status:  full code Disposition: admit to ICU  Labs   CBC: Recent Labs  Lab 04/10/21 1033  WBC 5.9  HGB 9.1*  HCT 29.0*  MCV 99.0  PLT 109*    Basic Metabolic Panel: Recent Labs  Lab 04/10/21 1033  NA 136  K 4.5  CL 98  CO2 23  GLUCOSE 106*  BUN 82*  CREATININE 9.76*  CALCIUM 8.7*   GFR: CrCl cannot be calculated (Unknown ideal weight.). Recent Labs  Lab 04/10/21 1033  WBC 5.9    Liver Function Tests: No results for input(s): AST, ALT, ALKPHOS, BILITOT, PROT, ALBUMIN in the last 168 hours. No results for input(s): LIPASE, AMYLASE in the last 168 hours. No results for input(s): AMMONIA in the last 168 hours.  ABG No results found for: PHART, PCO2ART, PO2ART, HCO3, TCO2, ACIDBASEDEF, O2SAT   Coagulation Profile: No results for input(s): INR, PROTIME in the last 168 hours.  Cardiac Enzymes: No results for input(s): CKTOTAL, CKMB, CKMBINDEX, TROPONINI in the last 168 hours.  HbA1C: Hemoglobin A1C  Date/Time Value Ref Range Status  11/08/2015 12:00 AM 7.5  Final   Hgb A1c MFr Bld  Date/Time Value Ref Range Status  01/02/2019 11:53 AM 6.9 (H) 4.8 - 5.6 % Final    Comment:    (NOTE) Pre diabetes:          5.7%-6.4% Diabetes:              >6.4% Glycemic control for   <7.0% adults with diabetes   10/02/2014 12:00 AM 9.6 (A) 4.0 - 6.0 % Final    CBG: No results for input(s): GLUCAP in the last 168 hours.  Review of Systems:   Gen: Denies fever, chills, weight change, fatigue, night sweats HEENT: Denies blurred vision, double vision, hearing loss, tinnitus, sinus congestion, rhinorrhea, sore throat, neck stiffness, dysphagia PULM:per  HPI CV: Denies chest pain, edema, orthopnea, paroxysmal nocturnal dyspnea, palpitations GI: Denies abdominal pain, nausea, vomiting, diarrhea, hematochezia, melena, constipation, change in bowel habits GU: Denies dysuria, hematuria, polyuria, oliguria, urethral discharge Endocrine: Denies hot or cold intolerance, polyuria, polyphagia or appetite change Derm: Denies rash, dry skin, scaling or peeling skin change Heme: Denies easy bruising, bleeding, bleeding gums Neuro: per HPI   Past Medical History:  He,  has a past medical history of Diabetes mellitus without complication ([place]), History of bleeding ulcers, History of TIAs, Hypertension, and Low kidney function.   Surgical History:   Past Surgical History:  Procedure Laterality Date  . CATARACT EXTRACTION W/PHACO Left 02/15/2014   Procedure: CATARACT EXTRACTION PHACO AND INTRAOCULAR LENS PLACEMENT (IOC);  Surgeon: Tonny Branch, MD;  Location: [facility];  Service: Ophthalmology;  Laterality: Left;  CDE 10.84  . CATARACT EXTRACTION W/PHACO Right 12/14/2013   Procedure: CATARACT EXTRACTION PHACO  AND INTRAOCULAR LENS PLACEMENT (IOC);  Surgeon: Tonny Branch, MD;  Location: [facility];  Service: Ophthalmology;  Laterality: Right;  CDE:  16.30  . CHOLECYSTECTOMY  02/2013  . EYE SURGERY    . IR FLUORO GUIDE CV LINE RIGHT  01/05/2019  . IR US GUIDE VASC ACCESS RIGHT  01/05/2019  . LAPAROSCOPIC NEPHRECTOMY Right 01/08/2019   Procedure: LAPAROSCOPIC RADICAL NEPHRECTOMY;  Surgeon: Raynelle Bring, MD;  Location: [facility];  Service: Urology;  Laterality: Right;  . PENILE PROSTHESIS IMPLANT    . TOE AMPUTATION Right 2013   [facility]     Social History:   reports that he has never smoked. He has never used smokeless tobacco. He reports current alcohol use. He reports that he does not use drugs.   Family History:  His family history includes Cancer in his brother and father; Stroke in his father and mother.   Allergies No Known Allergies   Home  Medications  Prior to Admission medications   Medication Sig Start Date End Date Taking? Authorizing Provider  atorvastatin (LIPITOR) 20 MG tablet Take 20 mg by mouth daily.    [provider]  blood glucose meter kit and supplies KIT Dispense based on patient and insurance preference. Use up to four times daily as directed. (FOR ICD-9 250.00, 250.01). 11/08/15   Cassandria Anger, MD  Blood Glucose Monitoring Suppl (BAYER CONTOUR MONITOR) w/Device KIT 1 each by Does not apply route 4 (four) times daily. Test 4 x daily 11/08/15   Cassandria Anger, MD  clonazePAM (KLONOPIN) 0.5 MG tablet Take 0.5 mg by mouth daily as needed for anxiety.    [provider]  cloNIDine (CATAPRES) 0.2 MG tablet Take 0.2 mg by mouth 2 (two) times daily.    [provider]  escitalopram (LEXAPRO) 10 MG tablet Take 10 mg by mouth daily.    [provider]  glucose blood (BAYER CONTOUR TEST) test strip Use as instructed 4 x daily 11/08/15   Cassandria Anger, MD  hydrALAZINE (APRESOLINE) 100 MG tablet Take 100 mg by mouth 3 (three) times daily.    [provider]  HYDROcodone-acetaminophen (NORCO/VICODIN) 5-325 MG tablet Take 1-2 tablets by mouth every 6 (six) hours as needed. 01/09/19   Raynelle Bring, MD  Insulin Glargine [facility] KWIKPEN) 100 UNIT/ML SOPN Inject 30-50 Units into the skin at bedtime.    [provider]  Insulin Pen Needle (B-D ULTRAFINE III SHORT PEN) 31G X 8 MM MISC 1 each by Does not apply route as directed. 09/02/15   Cassandria Anger, MD  Iron-FA-B Cmp-C-Biot-Probiotic (FUSION PLUS) CAPS Take 1 tablet by mouth daily. 11/20/18   [provider]  Lancets MISC 1 each by Does not apply route 4 (four) times daily. 11/08/15   Cassandria Anger, MD  metoprolol succinate (TOPROL-XL) 25 MG 24 hr tablet Take 25 mg by mouth daily.    [provider]  sitaGLIPtin (JANUVIA) 50 MG tablet Take 50 mg by mouth daily.    [provider]  sodium bicarbonate 650 MG tablet Take 650 mg by mouth 2 (two) times daily.    [provider]     Critical care time: 35 minutes     Roselie Awkward, MD [facility] Pager: [phone] Cell: [phone] If no response, please call [phone] until 7pm After 7:00 pm call Elink  [phone]

## 2021-04-10 NOTE — H&P (Addendum)
Date: 04/10/2021               Patient Name:  Maxwell Harmon MRN: [id number]  DOB: 07/14/1956 Age / Sex: 65 y.o., male   PCP: [name], DO              Medical Service: Internal Medicine Teaching Service              Attending Physician: Dr. Juanito Doom, MD    First Contact: Oswaldo Conroy, MS4 Pager: [phone]  Second Contact: Dr. 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   He reports increased fatigue and weakness over the past week. In the past 2-3 days, he has had increased difficulty breathing. Activities that he previously tolerated will cause him to become short of breath. He denies orthopnea but has noticed increased pain and swelling in his legs. Wife, Maxwell Harmon, notes that it looks like he has been working harder to breathe.   He typically does peritoneal dialysis at home, but has had problems with the machine x3 days last week. Due to this, he had outpatient hemodialysis last Friday with his next dialysis today, which he rescheduled due to podiatry appointment. Over the past week, wife reports that he has been somewhat confused. He reports nausea without emesis, decreased appetite, and one episode of diarrhea yesterday. He denies chest pain or pleuritic pain. No fevers, chills, cough, or abdominal pain.   He developed R Charcot arthropathy in 09/2020 for which he follows with podiatry. Since 11/2020, he has had a chronic wound on L heal. He also has a wound on L great toe. He has some redness on his foot  that they first noticed a few days ago, unsure of onset as foot is typically wrapped in an ace bandage. He has not been able to exercise secondary to BL foot pain and is now ambulating with a walker. Wife has noticed increased weakness in patient over the past month. He is no longer able to lift his boxes of dialysis supplies.   Per chart review, he had a dobutamine stress echo on 03/03/21 as part of a routine workup for renal transplant that was nondiagnostic due to subtarget heart rate. Resting echo at that time showed EF 55-60% with normal L ventricular wall motion at rest.   ED Course: Afebrile with borderline low pressures, SBP 87-132 DBP 53-97 although measurements from R wrist. RR to 25 with SpO2 93+ on RA. ECG with new T wave inversions in lateral leads. Troponin 1294>>1335. BNP 4240. Mild anemia on CBC. BMP consistent with ESRD with BUN 82, sCr 9.76.  Meds:  Medications below verified as taking as instructed:   Atorvastatin 40mg  daily  ASA 81mg  daily Clonazepam 0.5mg  daily prn anxiety Clonidine 0.2mg  bid Lasix 40mg  bid Losartan 100mg  daily Effexor 37.5mg  daily Metoprolol XL 12.5mg  daily Pramipexole 50mg  daily Tamsulosin 0.4mg  daily Insulin glargine  Plavix daily - unsure of dose  Allergies: Allergies as of 04/10/2021  . (No Known  Allergies)   Past Medical History:  Diagnosis Date  . Diabetes mellitus without complication ([place])   . History of bleeding ulcers   . History of TIAs   . Hypertension   . Low kidney function    R nephrectomy 01/2019 Cholecystectomy 5-6 years ago  Family History:  Family History  Problem Relation Age of Onset  . Stroke Mother   . Stroke Father   . Cancer Father   . Cancer Brother    Social History:  Never smoker Social alcohol use   Review of Systems: A complete ROS was negative except as per HPI.  Labs: CBC    Component Value Date/Time   WBC 5.9 04/10/2021 1033   RBC 2.93 (L) 04/10/2021 1033   HGB 9.1 (L) 04/10/2021 1033    HCT 29.0 (L) 04/10/2021 1033   PLT 109 (L) 04/10/2021 1033   MCV 99.0 04/10/2021 1033   MCH 31.1 04/10/2021 1033   MCHC 31.4 04/10/2021 1033   RDW 14.9 04/10/2021 1033   BMP Latest Ref Rng & Units 04/10/2021 01/11/2019 01/10/2019  Glucose 70 - 99 mg/dL 106(H) 129(H) -  BUN 8 - 23 mg/dL 82(H) 78(H) -  Creatinine 0.61 - 1.24 mg/dL 9.76(H) 6.22(H) 6.32(H)  Sodium 135 - 145 mmol/L 136 133(L) -  Potassium 3.5 - 5.1 mmol/L 4.5 4.9 -  Chloride 98 - 111 mmol/L 98 106 -  CO2 22 - 32 mmol/L 23 18(L) -  Calcium 8.9 - 10.3 mg/dL 8.7(L) 7.9(L) -   Physical Exam: Blood pressure (!) 108/49, pulse 71, temperature 98.3 F (36.8 C), resp. rate 18, SpO2 95 %.  General: Tired-appearing older gentleman  HENT: Normocephalic, atraumatic. Mucus membranes moist. Hearing grossly intact CV: Regular rate and rhythm. Holosystolic murmur. BL 2+ pitting edema almost to knees. L foot cool to touch, R foot warm. Pulm: Decreased breath sounds especially in BL lower lobes, otherwise CTAB with no wheezes or crackles. Tachypneic with increased work of breathing. Able to speak in sentences with drop in O2 saturation to 88 while talking Abd: Soft, nondistended. Mildly tender to palpation LLQ/flank.  Neuro: Alert and grossly oriented. Able to move all extremities spontaneously. No grossly apparent focal neurologic deficits Psych: Appropriate affect. Thought process linear, logical, and goal-oriented MSK: Charcot arthropathy deformity R foot Derm: Extensive ecchymosis LUE. Ulcer on L lateral heel. Scabbed wound on L great toe with surrounding erythema. No increased warmth (foot cool to touch). See photos below:        EKG: personally reviewed my interpretation is normal sinus rhythm with new T wave inversions in lateral leads.  CXR: personally reviewed my interpretation is mild cardiomegaly, vascular congestion. Bibasliar atelectasis with small effusions.   Echocardiogram: 1. LV function appears preserved at the base and  diminished moving toward apex, akinetic at the apex. This is across all segments. Differential includes takotsubo's cardiomyopathy vs. multivessel CAD. LV thrombus excluded by contrast. Left ventricular ejection fraction, by estimation, is 30 to 35%. The left ventricle has moderately decreased function. The left ventricle demonstrates regional wall motion abnormalities (see scoring diagram/findings for description). The left ventricular internal cavity size was severely dilated. Left ventricular diastolic parameters are indeterminate. 2. Right ventricular systolic function is moderately reduced. The right ventricular size is moderately enlarged. 3. Left atrial size was moderately dilated. 4. Right atrial size was mildly dilated. 5. The mitral valve is degenerative. Mild to moderate mitral valve regurgitation. No evidence of mitral stenosis. 6. The aortic valve is tricuspid. There is moderate calcification of the  aortic valve. There is moderate thickening of the aortic valve. Aortic valve regurgitation is not visualized. Mild aortic valve stenosis. 7. The inferior vena cava is dilated in size with <50% respiratory variability, suggesting right atrial pressure of 15 mmHg. Comparison: No prior Echocardiogram. Conclusion/Recommendation: Severely reduced LVEF with worse wall motion at the apex compared to the base. Concerning for Takotsubo's vs. multivessel CAD/apical LAD.  Assessment & Plan by Problem: Active Problems:   Acute heart failure (Home)   Uremia  Mr. Sans is a 65 year old gentleman with PMHx of DM2 on insulin, CAD s/p CABG, ESRD on peritoneal dialysis, s/p R nephrectomy, HTN, PAD, TIA who presented to ED with 1 week of progressive fatigue, weakness, and SOB in the setting of problems with home peritoneal dialysis machine.   #Acute Heart Failure Patient presented with progressive SOB and was found to have signs of volume overload on physical exam, CXR showing vascular congestion,  and BNP >4000. Problems with peritoneal dialysis likely contributing to volume overload. His EF is decreased from 44-60% in 02/2021 to 30-35% today.  - Due to acute decrease in EF, patient will be transferred to ICU for higher level of care under the management of cardiology.  - Happy to resume care of patient once he returns to floor  #ACS vs Takotsubo cardiomyopathy #CAD s/p three-vessel CABG 05/2020 He has new t-wave inversions in lateral leads on EKG and troponin to 1300.  Echo today revealed reduced wall motion at apex concerning for multivessel CAD/apical LAD vs Takotsubo cardiomyopathy. He did not have any chest pain at time of CABG last year either. He takes ASA and Plavix at home.  - Will optimize volume status and uremia before considering cath lab tomorrow - Will hold home losartan and metoprolol due to low pressures - Gradually taper clonidine to avoid rebound hypertension. Decrease to 0.1mg  bid  - Cardiac monitoring  #Uremia #ESRD on dialysis #s/p R nephrectomy Patient has had problems with his home peritoneal dialysis machine as well as PD catheter; due to these, he has been receiving HD for the past week; has not had hemodialysis since 6/3. - Appreciate nephrology involvement and recommendations - Inpatient hemodialysis today managed by nephrology - Per nephrology, may attempt CCPD tomorrow  - Monitor BMP  #Chronic, nonhealing L heel ulcer #PAD His pressures have been borderline low, although unclear whether measurements are accurate due to BP cuff on wrist. Some concern for infection of chronic foot wounds. While his WBC is not elevated, this cannot rule out infection in L foot given his history of PAD with L foot cool to touch on exam. - Blood cultures - Appreciate ortho consult and recommendations  #DM2 on insulin Last a1c in chart from 2021 elevated at 8.1%.  - A1c pending - Sliding scale insulin with ESRD correction coverage - CBG monitoring before meals and at  bedtime - Hypoglycemia precautions   #Mild anemia Hgb 9.1, historical baseline 9.8-7.4. Likely due to ESRD. Will defer to nephrology for management.  Dispo: Admit patient to Inpatient with expected length of stay greater than 2 midnights.  Signed: Theodosia Blender, Medical Student 04/10/2021, 6:23 PM  Pager: [phone]  Attestation for Student Documentation:  I personally was present and performed or re-performed the history, physical exam and medical decision-making activities of this service and have verified that the service and findings are accurately documented in the student's note.  Jose Persia, MD 04/10/2021, 7:19 PM

## 2021-04-10 NOTE — Progress Notes (Signed)
Patient ID: Maxwell Harmon, male   DOB: Jun 26, 1956, 65 y.o.   MRN: [id number] I have taken care of Maxwell Harmon in the past in terms of removing a toe.  He is a patient of our practice.  I asked Dr. Sharol Given to take a look at his foot due to the worsening condition the foot is in and due to the fact that this is more in the realm of Dr. Jess Barters expert care as it relates to diabetic foot wounds.

## 2021-04-10 NOTE — ED Triage Notes (Signed)
Patient complains of SOB x 1 day, denies cp. Patient states that the SOB worse with any exertion. Patient is a dialysis patient and last treatment was Friday. Alert and oriented, NAD

## 2021-04-10 NOTE — ED Provider Notes (Signed)
Emergency Medicine Provider Triage Evaluation Note  Maxwell Harmon , a 65 y.o. male  was evaluated in triage.  Pt complains of SOB x 1 week. Worse with exertion, no chest pain. More fatigued than normal the past week. Last dialysis Friday. Due today.   Review of Systems  Positive: SOB Negative: Chest pain   Physical Exam  There were no vitals taken for this visit. Gen:   Awake, no distress   Resp:  Normal effort  MSK:   Moves extremities without difficulty  Other:  Lungs CTA bilaterally.   Medical Decision Making  Medically screening exam initiated at 10:31 AM.  Appropriate orders placed.  [name] was informed that the remainder of the evaluation will be completed by another provider, this initial triage assessment does not replace that evaluation, and the importance of remaining in the ED until their evaluation is complete.    Sherrill Raring, PA-C 04/10/21 1033    Charlesetta Shanks, MD 04/10/21 1048

## 2021-04-10 NOTE — Consult Note (Signed)
[facility] [place]  ASSESSMENT / PLAN: Maxwell Harmon is a 65 y.o. male with atherosclerosis of native arteries of left lower extremity causing ulceration.  Patient counseled patients with chronic limb threatening ischemia have an annual risk of cardiovascular mortality of 25% and a annual amputation risk of 25%. Aggressive risk factor modification and intervention for limb salvage is indicated.  Recommend the following which can slow the progression of atherosclerosis and reduce the risk of major adverse cardiac / limb events:  Complete cessation from all tobacco products. Blood glucose control with goal A1c < 7%. Blood pressure control with goal blood pressure < 140/90 mmHg. Lipid reduction therapy with goal LDL-C <100 mg/dL (<70 if symptomatic from PAD).  Aspirin 81mg  PO QD.  Atorvastatin 40-80mg  PO QD (or other "high intensity" statin therapy). Will obtain baseline ABI  Plan left lower extremity angiogram with possible intervention via right common femoral artery access approach in cath lab once he has improved clinically. I have time to do this Wednesday, but I suspect he will not be medically optimized by this time. Will discuss with the primary service in the morning.   CHIEF COMPLAINT: weakness  HISTORY OF PRESENT ILLNESS: Maxwell Harmon is a 65 y.o. male who presents to [facility] ER for evaluation of weakness. His history is significant for CAD s/p CABG, ESRD on PD with recent transition to HD via LUE BC AVF, DM2.  He has been under the care of a podiatrist who has performed local wound care on two areas of ulceration of his left foot. These have been present for >5 months and are not healing. They are painful. He has no history of peripheral intervention.  Past Medical History:  Diagnosis Date  . Diabetes mellitus without complication ([place])   . History of bleeding ulcers   . History of TIAs   . Hypertension   . Low kidney function     Past Surgical  History:  Procedure Laterality Date  . CATARACT EXTRACTION W/PHACO Left 02/15/2014   Procedure: CATARACT EXTRACTION PHACO AND INTRAOCULAR LENS PLACEMENT (IOC);  Surgeon: Tonny Branch, MD;  Location: [facility];  Service: Ophthalmology;  Laterality: Left;  CDE 10.84  . CATARACT EXTRACTION W/PHACO Right 12/14/2013   Procedure: CATARACT EXTRACTION PHACO AND INTRAOCULAR LENS PLACEMENT (IOC);  Surgeon: Tonny Branch, MD;  Location: [facility];  Service: Ophthalmology;  Laterality: Right;  CDE:  16.30  . CHOLECYSTECTOMY  02/2013  . EYE SURGERY    . IR FLUORO GUIDE CV LINE RIGHT  01/05/2019  . IR US GUIDE VASC ACCESS RIGHT  01/05/2019  . LAPAROSCOPIC NEPHRECTOMY Right 01/08/2019   Procedure: LAPAROSCOPIC RADICAL NEPHRECTOMY;  Surgeon: Raynelle Bring, MD;  Location: [facility];  Service: Urology;  Laterality: Right;  . PENILE PROSTHESIS IMPLANT    . TOE AMPUTATION Right 2013   [facility]    Family History  Problem Relation Age of Onset  . Stroke Mother   . Stroke Father   . Cancer Father   . Cancer Brother     Social History   Socioeconomic History  . Marital status: Married    Spouse name: Not on file  . Number of children: Not on file  . Years of education: Not on file  . Highest education level: Not on file  Occupational History  . Not on file  Tobacco Use  . Smoking status: Never Smoker  . Smokeless tobacco: Never Used  Vaping Use  . Vaping Use: Never used  Substance and Sexual Activity  . Alcohol use: Yes    Comment: social drink   . Drug use: No  . Sexual activity: Not on file  Other Topics Concern  . Not on file  Social History Narrative  . Not on file   Social Determinants of Health   Financial Resource Strain: Not on file  Food Insecurity: Not on file  Transportation Needs: Not on file  Physical Activity: Not on file  Stress: Not on file  Social Connections: Not on file  Intimate Partner Violence: Not on file    No Known Allergies  Current Facility-Administered  Medications  Medication Dose Route Frequency Provider Last Rate Last Admin  . 0.9 %  sodium chloride infusion  100 mL Intravenous PRN Donato Heinz, MD      . 0.9 %  sodium chloride infusion  100 mL Intravenous PRN Donato Heinz, MD      . 0.9 %  sodium chloride infusion  250 mL Intravenous Continuous Corey Harold, NP      . alteplase (CATHFLO ACTIVASE) injection 2 mg  2 mg Intracatheter Once PRN Donato Heinz, MD      . Derrill Memo ON 04/11/2021] atorvastatin (LIPITOR) tablet 80 mg  80 mg Oral Daily Jose Persia, MD      . Derrill Memo ON 04/11/2021] Chlorhexidine Gluconate Cloth 2 % PADS 6 each  6 each Topical Q0600 Donato Heinz, MD      . docusate sodium (COLACE) capsule 100 mg  100 mg Oral BID PRN Corey Harold, NP      . heparin injection 1,000 Units  1,000 Units Dialysis PRN Donato Heinz, MD      . heparin injection 20 Units/kg  20 Units/kg Dialysis PRN Donato Heinz, MD      . heparin injection 5,000 Units  5,000 Units Subcutaneous Q8H Corey Harold, NP   5,000 Units at 04/10/21 2144  . [START ON 04/11/2021] insulin aspart (novoLOG) injection 0-6 Units  0-6 Units Subcutaneous TID WC Jose Persia, MD      . lidocaine (PF) (XYLOCAINE) 1 % injection 5 mL  5 mL Intradermal PRN Donato Heinz, MD      . lidocaine-prilocaine (EMLA) cream 1 application  1 application Topical PRN Donato Heinz, MD      . norepinephrine (LEVOPHED) 4mg  in 228mL premix infusion  2-10 mcg/min Intravenous Titrated Corey Harold, NP      . pantoprazole (PROTONIX) injection 40 mg  40 mg Intravenous QHS Corey Harold, NP   40 mg at 04/10/21 2145  . pentafluoroprop-tetrafluoroeth (GEBAUERS) aerosol 1 application  1 application Topical PRN Donato Heinz, MD      . Derrill Memo ON 04/11/2021] piperacillin-tazobactam (ZOSYN) IVPB 2.25 g  2.25 g Intravenous Q8H Mancheril, Darnell Level, RPH      . polyethylene glycol (MIRALAX / GLYCOLAX) packet 17 g  17 g Oral Daily PRN Corey Harold, NP       . vancomycin Alcus Dad) IVPB 1750 mg/350 mL  1,750 mg Intravenous Once Lavenia Atlas, RPH 175 mL/hr at 04/10/21 2122 1,750 mg at 04/10/21 2122  . vancomycin variable dose per unstable renal function (pharmacist dosing)   Does not apply See admin instructions Mancheril, Darnell Level, RPH        REVIEW OF SYSTEMS:  [X]  denotes positive finding, [ ]  denotes negative finding Cardiac  Comments:  Chest pain or chest pressure:    Shortness of breath upon exertion:    Short of breath when lying flat:  Irregular heart rhythm:        Vascular    Pain in calf, thigh, or hip brought on by ambulation:    Pain in feet at night that wakes you up from your sleep:     Blood clot in your veins:    Leg swelling:         Pulmonary    Oxygen at home:    Productive cough:     Wheezing:         Neurologic    Sudden weakness in arms or legs:     Sudden numbness in arms or legs:     Sudden onset of difficulty speaking or slurred speech:    Temporary loss of vision in one eye:     Problems with dizziness:         Gastrointestinal    Blood in stool:     Vomited blood:         Genitourinary    Burning when urinating:     Blood in urine:        Psychiatric    Major depression:         Hematologic    Bleeding problems:    Problems with blood clotting too easily:        Skin    Rashes or ulcers:        Constitutional    Fever or chills:      PHYSICAL EXAM  Vitals:   04/10/21 2000 04/10/21 2015 04/10/21 2030 04/10/21 2045  BP: (!) 119/40 (!) 106/43 112/61 (!) 111/53  Pulse: 77 76 74 77  Resp: (!) 23 (!) 25 (!) 29 15  Temp:      SpO2: 100% 92% 92% 98%    Constitutional: chronically ill appearing. No distress. Appears well nourished.  Neurologic: CN intact. no focal findings. no sensory loss. Psychiatric: Mood and affect symmetric and appropriate. Eyes: No icterus. No conjunctival pallor. Ears, nose, throat: mucous membranes moist. Midline trachea.  Cardiac: regular rate  and rhythm.  Respiratory: unlabored. Abdominal: soft, non-tender, non-distended.  Peripheral vascular:  LUE brachiocephalic arteriovenous fistula with good thrill  1+ femoral pulses bilaterally  No palpable popliteal pulses  No palpable pedal pulses  Biphasic doppler flow in PT bilaterally  Left lateral heel unstageable ulceration about a half dollar in size  Small <1cm left first PIP joint unstageable ulcer Extremity: No edema. No cyanosis. No pallor.  Skin: as above Lymphatic: No Stemmer's sign. No palpable lymphadenopathy.  PERTINENT LABORATORY AND RADIOLOGIC DATA  Most recent CBC CBC Latest Ref Rng & Units 04/10/2021 01/11/2019 01/10/2019  WBC 4.0 - 10.5 K/uL 5.9 7.2 -  Hemoglobin 13.0 - 17.0 g/dL 9.1(L) 7.4(L) 7.6(L)  Hematocrit 39.0 - 52.0 % 29.0(L) 23.5(L) 24.3(L)  Platelets 150 - 400 K/uL 109(L) 112(L) -     Most recent CMP CMP Latest Ref Rng & Units 04/10/2021 01/11/2019 01/10/2019  Glucose 70 - 99 mg/dL 106(H) 129(H) -  BUN 8 - 23 mg/dL 82(H) 78(H) -  Creatinine 0.61 - 1.24 mg/dL 9.76(H) 6.22(H) 6.32(H)  Sodium 135 - 145 mmol/L 136 133(L) -  Potassium 3.5 - 5.1 mmol/L 4.5 4.9 -  Chloride 98 - 111 mmol/L 98 106 -  CO2 22 - 32 mmol/L 23 18(L) -  Calcium 8.9 - 10.3 mg/dL 8.7(L) 7.9(L) -  Total Protein 6.5 - 8.1 g/dL 5.5(L) - -  Total Bilirubin 0.3 - 1.2 mg/dL 0.7 - -  Alkaline Phos 38 - 126 U/L 86 - -  AST 15 - 41 U/L 23 - -  ALT 0 - 44 U/L 15 - -    Renal function CrCl cannot be calculated (Unknown ideal weight.).  Hemoglobin A1C (no units)  Date Value  11/08/2015 7.5   Hgb A1c MFr Bld (%)  Date Value  01/02/2019 6.9 (H)    LDL Cholesterol  Date Value Ref Range Status  04/10/2021 74 0 - 99 mg/dL Final    Comment:           Total Cholesterol/HDL:CHD Risk Coronary Heart Disease Risk Table                     Men   Women  1/2 Average Risk   3.4   3.3  Average Risk       5.0   4.4  2 X Average Risk   9.6   7.1  3 X Average Risk  23.4   11.0        Use the  calculated Patient Ratio above and the CHD Risk Table to determine the patient's CHD Risk.        ATP III CLASSIFICATION (LDL):  <100     mg/dL   Optimal  100-129  mg/dL   Near or Above                    Optimal  130-159  mg/dL   Borderline  160-189  mg/dL   High  >190     mg/dL   Very High Performed at [facility] [address], [place], [place] [address]     Yevonne Aline. Stanford Breed, MD [facility] Phone Number: [phone] 04/10/2021 9:46 PM

## 2021-04-10 NOTE — Progress Notes (Signed)
Pharmacy Antibiotic Note  Maxwell Harmon is a 65 y.o. male admitted on 04/10/2021 with sepsis.  Pharmacy has been consulted for Zosyn and vancomycin dosing.  WBC wnl, h/o ESRD on peritoneal dialysis at home but recently underwent HD given problems with peritoneal dialysis at home.   WBC wnl  Plan: -Start Zosyn 2.25 gm IV Q 8 hours  -F/u dialysis plans and order vancomycin dose per plans. Will dose off levels for now -Monitor CBC, renal fx, cultures and clinical progress     Temp (24hrs), Avg:98.3 F (36.8 C), Min:98.3 F (36.8 C), Max:98.3 F (36.8 C)  Recent Labs  Lab 04/10/21 1033  WBC 5.9  CREATININE 9.76*    CrCl cannot be calculated (Unknown ideal weight.).    No Known Allergies  Antimicrobials this admission: Zosyn 6/6 >>  Vancomycin 6/6 >>   Dose adjustments this admission:  Microbiology results: 6/6 BCx:    Thank you for allowing pharmacy to be a part of this patient's care.  Albertina Parr, PharmD., BCPS, BCCCP Clinical Pharmacist Please refer to [facility] for unit-specific pharmacist

## 2021-04-10 NOTE — Progress Notes (Signed)
  Echocardiogram 2D Echocardiogram with contrast has been performed.  Maxwell Harmon F 04/10/2021, 3:41 PM

## 2021-04-10 NOTE — ED Notes (Signed)
Blood pressure currently normotensive with 112/51mmHg, Gerald Stabs RN aware that pt will be going up with levophed at bedside (at this time).

## 2021-04-11 ENCOUNTER — Inpatient Hospital Stay (HOSPITAL_COMMUNITY): Payer: Managed Care, Other (non HMO)

## 2021-04-11 DIAGNOSIS — I5021 Acute systolic (congestive) heart failure: Secondary | ICD-10-CM

## 2021-04-11 DIAGNOSIS — M7989 Other specified soft tissue disorders: Secondary | ICD-10-CM

## 2021-04-11 DIAGNOSIS — I739 Peripheral vascular disease, unspecified: Secondary | ICD-10-CM

## 2021-04-11 DIAGNOSIS — A419 Sepsis, unspecified organism: Principal | ICD-10-CM

## 2021-04-11 DIAGNOSIS — L8992 Pressure ulcer of unspecified site, stage 2: Secondary | ICD-10-CM | POA: Insufficient documentation

## 2021-04-11 DIAGNOSIS — I5043 Acute on chronic combined systolic (congestive) and diastolic (congestive) heart failure: Secondary | ICD-10-CM

## 2021-04-11 DIAGNOSIS — L0390 Cellulitis, unspecified: Secondary | ICD-10-CM

## 2021-04-11 DIAGNOSIS — R509 Fever, unspecified: Secondary | ICD-10-CM

## 2021-04-11 DIAGNOSIS — L8990 Pressure ulcer of unspecified site, unspecified stage: Secondary | ICD-10-CM | POA: Insufficient documentation

## 2021-04-11 DIAGNOSIS — G9340 Encephalopathy, unspecified: Secondary | ICD-10-CM

## 2021-04-11 DIAGNOSIS — L03116 Cellulitis of left lower limb: Secondary | ICD-10-CM

## 2021-04-11 DIAGNOSIS — R6520 Severe sepsis without septic shock: Secondary | ICD-10-CM

## 2021-04-11 DIAGNOSIS — Z794 Long term (current) use of insulin: Secondary | ICD-10-CM

## 2021-04-11 DIAGNOSIS — E1151 Type 2 diabetes mellitus with diabetic peripheral angiopathy without gangrene: Secondary | ICD-10-CM

## 2021-04-11 LAB — GLUCOSE, CAPILLARY
Glucose-Capillary: 88 mg/dL (ref 70–99)
Glucose-Capillary: 77 mg/dL (ref 70–99)
Glucose-Capillary: 83 mg/dL (ref 70–99)
Glucose-Capillary: 88 mg/dL (ref 70–99)
Glucose-Capillary: 102 mg/dL — ABNORMAL HIGH (ref 70–99)
Glucose-Capillary: 270 mg/dL — ABNORMAL HIGH (ref 70–99)

## 2021-04-11 LAB — CBC
HCT: 26.9 % — ABNORMAL LOW (ref 39.0–52.0)
Hemoglobin: 8.7 g/dL — ABNORMAL LOW (ref 13.0–17.0)
MCH: 31.1 pg (ref 26.0–34.0)
MCHC: 32.3 g/dL (ref 30.0–36.0)
MCV: 96.1 fL (ref 80.0–100.0)
Platelets: 99 10*3/uL — ABNORMAL LOW (ref 150–400)
RBC: 2.8 MIL/uL — ABNORMAL LOW (ref 4.22–5.81)
RDW: 15 % (ref 11.5–15.5)
WBC: 3.8 10*3/uL — ABNORMAL LOW (ref 4.0–10.5)
nRBC: 0 % (ref 0.0–0.2)

## 2021-04-11 LAB — MRSA PCR SCREENING: MRSA by PCR: NEGATIVE

## 2021-04-11 LAB — BASIC METABOLIC PANEL
Anion gap: 11 (ref 5–15)
BUN: 33 mg/dL — ABNORMAL HIGH (ref 8–23)
CO2: 26 mmol/L (ref 22–32)
Calcium: 8.2 mg/dL — ABNORMAL LOW (ref 8.9–10.3)
Chloride: 98 mmol/L (ref 98–111)
Creatinine, Ser: 4.44 mg/dL — ABNORMAL HIGH (ref 0.61–1.24)
GFR, Estimated: 14 mL/min — ABNORMAL LOW (ref >60)
Glucose, Bld: 101 mg/dL — ABNORMAL HIGH (ref 70–99)
Potassium: 3 mmol/L — ABNORMAL LOW (ref 3.5–5.1)
Sodium: 135 mmol/L (ref 135–145)

## 2021-04-11 LAB — TROPONIN I (HIGH SENSITIVITY): Troponin I (High Sensitivity): 1383 ng/L (ref <18)

## 2021-04-11 LAB — HEMOGLOBIN A1C
Hgb A1c MFr Bld: 8.2 % — ABNORMAL HIGH (ref 4.8–5.6)
Mean Plasma Glucose: 189 mg/dL

## 2021-04-11 LAB — MAGNESIUM: Magnesium: 1.7 mg/dL (ref 1.7–2.4)

## 2021-04-11 LAB — PHOSPHORUS: Phosphorus: 3.5 mg/dL (ref 2.5–4.6)

## 2021-04-11 LAB — HEPATITIS B CORE ANTIBODY, TOTAL: Hep B Core Total Ab: NONREACTIVE

## 2021-04-11 MED ORDER — DELFLEX-LC/2.5% DEXTROSE 394 MOSM/L IP SOLN: INTRAPERITONEAL | Status: DC

## 2021-04-11 MED ORDER — GENTAMICIN SULFATE 0.1 % EX CREA
TOPICAL_CREAM | Freq: Every day | CUTANEOUS | Status: DC | PRN
  Filled 2021-04-11: qty 15

## 2021-04-11 MED ORDER — VANCOMYCIN HCL 1500 MG/300ML IV SOLN
1500.0000 mg | Freq: Once | INTRAVENOUS | Status: AC
  Administered 2021-04-11: 1500 mg via INTRAVENOUS
  Filled 2021-04-11: qty 300

## 2021-04-11 MED ORDER — MAGNESIUM SULFATE 2 GM/50ML IV SOLN
2.0000 g | Freq: Once | INTRAVENOUS | Status: AC
  Administered 2021-04-11: 2 g via INTRAVENOUS
  Filled 2021-04-11: qty 50

## 2021-04-11 MED ORDER — HEPARIN 1000 UNIT/ML FOR PERITONEAL DIALYSIS
INTRAPERITONEAL | Status: DC
  Filled 2021-04-11 (×5): qty 3000

## 2021-04-11 MED ORDER — CLONAZEPAM 0.125 MG PO TBDP
0.1250 mg | ORAL_TABLET | Freq: Two times a day (BID) | ORAL | Status: DC | PRN
  Administered 2021-04-15 – 2021-04-18 (×4): 0.125 mg via ORAL
  Filled 2021-04-11 (×4): qty 1

## 2021-04-11 MED ORDER — ONDANSETRON HCL 4 MG/2ML IJ SOLN
4.0000 mg | Freq: Four times a day (QID) | INTRAMUSCULAR | Status: DC | PRN
  Administered 2021-04-11: 4 mg via INTRAVENOUS
  Filled 2021-04-11: qty 2

## 2021-04-11 MED ORDER — GENTAMICIN SULFATE 0.1 % EX CREA
1.0000 "application " | TOPICAL_CREAM | Freq: Every day | CUTANEOUS | Status: DC
  Administered 2021-04-11 – 2021-04-18 (×6): 1 via TOPICAL

## 2021-04-11 MED ORDER — POTASSIUM CHLORIDE CRYS ER 20 MEQ PO TBCR
40.0000 meq | EXTENDED_RELEASE_TABLET | Freq: Once | ORAL | Status: AC
  Administered 2021-04-11: 40 meq via ORAL
  Filled 2021-04-11: qty 2

## 2021-04-11 MED ORDER — MIDODRINE HCL 5 MG PO TABS
10.0000 mg | ORAL_TABLET | Freq: Once | ORAL | Status: AC
  Administered 2021-04-11: 10 mg via ORAL
  Filled 2021-04-11: qty 2

## 2021-04-11 MED ORDER — ALBUMIN HUMAN 25 % IV SOLN
12.5000 g | Freq: Once | INTRAVENOUS | Status: AC
  Administered 2021-04-11: 12.5 g via INTRAVENOUS

## 2021-04-11 MED ORDER — ESCITALOPRAM OXALATE 10 MG PO TABS
10.0000 mg | ORAL_TABLET | Freq: Every day | ORAL | Status: DC
  Administered 2021-04-11 – 2021-04-20 (×9): 10 mg via ORAL
  Filled 2021-04-11 (×9): qty 1

## 2021-04-11 MED ORDER — HEPARIN 1000 UNIT/ML FOR PERITONEAL DIALYSIS: 500.0000 [IU] | INTRAMUSCULAR | Status: DC | PRN

## 2021-04-11 NOTE — Progress Notes (Addendum)
Patient ID: Maxwell Harmon, male   DOB: [birth year], 65 y.o.   MRN: [id number] S: "feel rough".  Tolerated HD well with UF of 2.2  Liters.  Transferred to ICU to start levophed. O:BP 112/68   Pulse 97   Temp 98.9 F (37.2 C)   Resp 20   Wt 95.2 kg   SpO2 97%   BMI 30.13 kg/m   Intake/Output Summary (Last 24 hours) at 04/11/2021 1146 Last data filed at 04/11/2021 0245 Gross per 24 hour  Intake 350 ml  Output 2246 ml  Net -1896 ml   Intake/Output: I/O last 3 completed shifts: In: 350 [IV Piggyback:350] Out: 2246 [Other:2246]  Intake/Output this shift:  No intake/output data recorded. Weight change:  Gen: NAD CVS: RRR Resp: CTA Abd: +BS, soft, NT/ND, PD catheter without drainage or erythema Ext: 1+ pretibial edema, left heel ulcer, left great toe   Recent Labs  Lab 04/10/21 1033 04/10/21 1725 04/10/21 2118 04/10/21 2131 04/10/21 2224 04/11/21 0114  NA 136  --  136  --   --  135  K 4.5  --  4.3  --   --  3.0*  CL 98  --  97*  --   --  98  CO2 23  --  25  --   --  26  GLUCOSE 106*  --  57*  --   --  101*  BUN 82*  --  87*  --   --  33*  CREATININE 9.76*  --  10.25*  --   --  4.44*  ALBUMIN  --  2.3* 2.4* 2.3* 2.2*  --   CALCIUM 8.7*  --  8.1*  --   --  8.2*  PHOS  --   --  8.1*  --   --  3.5  AST  --  23  --  29 23  --   ALT  --  15  --  14 15  --    Liver Function Tests: Recent Labs  Lab 04/10/21 1725 04/10/21 2118 04/10/21 2131 04/10/21 2224  AST 23  --  29 23  ALT 15  --  14 15  ALKPHOS 86  --  79 85  BILITOT 0.7  --  1.0 1.0  PROT 5.5*  --  5.7* 5.2*  ALBUMIN 2.3* 2.4* 2.3* 2.2*   Recent Labs  Lab 04/10/21 2131  LIPASE 25  AMYLASE 27*   No results for input(s): AMMONIA in the last 168 hours. CBC: Recent Labs  Lab 04/10/21 1033 04/10/21 2132 04/10/21 2133 04/11/21 0114  WBC 5.9  --  4.4 3.8*  HGB 9.1*  --  8.7* 8.7*  HCT 29.0*  --  28.2* 26.9*  MCV 99.0  --  98.9 96.1  PLT 109* 97* 113* 99*   Cardiac Enzymes: No results for input(s):  CKTOTAL, CKMB, CKMBINDEX, TROPONINI in the last 168 hours. CBG: Recent Labs  Lab 04/10/21 2235 04/11/21 0312 04/11/21 0650 04/11/21 0830  GLUCAP 87 88 77 83    Iron Studies: No results for input(s): IRON, TIBC, TRANSFERRIN, FERRITIN in the last 72 hours. Studies/Results: DG Chest 2 View  Result Date: 04/10/2021 CLINICAL DATA:  Shortness of breath EXAM: CHEST - 2 VIEW COMPARISON:  04/13/2020 FINDINGS: Prior CABG. Mild cardiomegaly and vascular congestion. Bibasilar atelectasis. Suspect small effusions. No acute bony abnormality. IMPRESSION: Mild cardiomegaly, vascular congestion. Bibasilar atelectasis with small effusions. Electronically Signed   By: Rolm Baptise M.D.   On: 04/10/2021 11:00   DG  Tibia/Fibula Left  Result Date: 04/10/2021 CLINICAL DATA:  Left lower extremity nonhealing wound EXAM: LEFT TIBIA AND FIBULA - 2 VIEW COMPARISON:  None. FINDINGS: Three view radiograph left knee demonstrates normal alignment. No fracture or dislocation. No osseous erosion. No effusion. Advanced vascular calcifications are seen throughout the left lower extremity. IMPRESSION: Peripheral vascular disease.  No osseous erosion. Electronically Signed   By: Fidela Salisbury MD   On: 04/10/2021 20:03   DG Foot 2 Views Left  Result Date: 04/10/2021 CLINICAL DATA:  Diabetic foot wound EXAM: LEFT FOOT - 2 VIEW COMPARISON:  None. FINDINGS: Two view radiograph left foot demonstrates normal alignment. No fracture or dislocation. No osseous erosion. Advanced vascular calcifications are seen throughout the left foot. Moderate plantar calcaneal spur. Joint spaces are preserved. IMPRESSION: Peripheral vascular disease with advanced vascular calcifications throughout the left foot. No osseous erosion. Electronically Signed   By: Fidela Salisbury MD   On: 04/10/2021 20:02   VAS [place] ABI WITH/WO TBI  Result Date: 04/11/2021  LOWER EXTREMITY DOPPLER STUDY Patient Name:  Maxwell Harmon  Date of Exam:   04/11/2021 Medical Rec #:  [id number]        Accession #:    [id number] Date of Birth: 1956-10-25        Patient Gender: M Patient Age:   65Y Exam Location:  [facility] Procedure:      VAS [place] ABI WITH/WO TBI Referring Phys: [id number] Yevonne Aline HAWKEN --------------------------------------------------------------------------------  Indications: Ulceration, and peripheral artery disease. High Risk Factors: Hypertension, Diabetes, coronary artery disease s/p CABG.  Comparison Study: No prior studies. Performing Technologist: [name] RDMS RVT  Examination Guidelines: A complete evaluation includes at minimum, Doppler waveform signals and systolic blood pressure reading at the level of bilateral brachial, anterior tibial, and posterior tibial arteries, when vessel segments are accessible. Bilateral testing is considered an integral part of a complete examination. Photoelectric Plethysmograph (PPG) waveforms and toe systolic pressure readings are included as required and additional duplex testing as needed. Limited examinations for reoccurring indications may be performed as noted.  ABI Findings: +---------+------------------+-----+-------------------+-----------------------+ Right    Rt Pressure (mmHg)IndexWaveform           Comment                 +---------+------------------+-----+-------------------+-----------------------+ Brachial 117                    triphasic          Unable to obtain due to                                                    IV, pressure obtained                                                      from RT forearm         +---------+------------------+-----+-------------------+-----------------------+ PTA      255               2.18 monophasic                                 +---------+------------------+-----+-------------------+-----------------------+  DP       61                0.52 dampened monophasic                         +---------+------------------+-----+-------------------+-----------------------+ Great Toe29                0.25 Abnormal                                   +---------+------------------+-----+-------------------+-----------------------+ +---------+------------------+-----+-------------------+-------+ Left     Lt Pressure (mmHg)IndexWaveform           Comment +---------+------------------+-----+-------------------+-------+ Brachial                                           AVF     +---------+------------------+-----+-------------------+-------+ PTA      255               2.18 dampened monophasic        +---------+------------------+-----+-------------------+-------+ DP       255               2.18 dampened monophasic        +---------+------------------+-----+-------------------+-------+ Great Toe0                 0.00 Absent                     +---------+------------------+-----+-------------------+-------+ +-------+-----------+-----------+------------+------------+ ABI/TBIToday's ABIToday's TBIPrevious ABIPrevious TBI +-------+-----------+-----------+------------+------------+ Right  [place]         29                                  +-------+-----------+-----------+------------+------------+ Left   [place]         0                                   +-------+-----------+-----------+------------+------------+  Arterial wall calcification precludes accurate ankle pressures and ABIs.  Summary: Right: Resting right ankle-brachial index indicates noncompressible right lower extremity arteries. The right toe-brachial index is abnormal. ABIs are unreliable. Left: Resting left ankle-brachial index indicates noncompressible left lower extremity arteries. The left toe-brachial index is abnormal. ABIs are unreliable.  *See table(s) above for measurements and observations.     Preliminary    ECHOCARDIOGRAM COMPLETE  Result Date: 04/10/2021    ECHOCARDIOGRAM REPORT   Patient  Name:   TERELL KINCY Date of Exam: 04/10/2021 Medical Rec #:  [id number]       Height:       70.0 in Accession #:    [id number]      Weight:       196.0 lb Date of Birth:  [birth year]       BSA:          2.069 m Patient Age:    74 years        BP:           111/56 mmHg Patient Gender: M               HR:           67 bpm. Exam Location:  Inpatient Procedure:  2D Echo, Cardiac Doppler, Color Doppler and Intracardiac            Opacification Agent Indications:    G53.64 Acute diastolic (congestive) heart failure  History:        Patient has no prior history of Echocardiogram examinations.                 Prior CABG, Signs/Symptoms:Shortness of Breath and Confusion;                 Risk Factors:Diabetes. ESRD on HD. LEE.  Sonographer:    Merrie Roof RDCS Referring Phys: [id number] PHILIP J NAHSER IMPRESSIONS  1. LV function appears preserved at the base and diminished moving toward apex, akinetic at the apex. This is across all segments. Differential includes takotsubo's cardiomyopathy vs. multivessel CAD. LV thrombus excluded by contrast. Left ventricular ejection fraction, by estimation, is 30 to 35%. The left ventricle has moderately decreased function. The left ventricle demonstrates regional wall motion abnormalities (see scoring diagram/findings for description). The left ventricular internal cavity size was severely dilated. Left ventricular diastolic parameters are indeterminate.  2. Right ventricular systolic function is moderately reduced. The right ventricular size is moderately enlarged.  3. Left atrial size was moderately dilated.  4. Right atrial size was mildly dilated.  5. The mitral valve is degenerative. Mild to moderate mitral valve regurgitation. No evidence of mitral stenosis.  6. The aortic valve is tricuspid. There is moderate calcification of the aortic valve. There is moderate thickening of the aortic valve. Aortic valve regurgitation is not visualized. Mild aortic valve stenosis.  7. The inferior vena  cava is dilated in size with <50% respiratory variability, suggesting right atrial pressure of 15 mmHg. Comparison(s): No prior Echocardiogram. Conclusion(s)/Recommendation(s): Severely reduced LVEF with worse wall motion at the apex compared to the base. Concerning for takotsubo's vs. multivessel CAD/apical LAD. Findings communicated with Dr. Acie Fredrickson. FINDINGS  Left Ventricle: LV function appears preserved at the base and diminished moving toward apex, akinetic at the apex. This is across all segments. Differential includes takotsubo's cardiomyopathy vs. multivessel CAD. LV thrombus excluded by contrast. Left ventricular ejection fraction, by estimation, is 30 to 35%. The left ventricle has moderately decreased function. The left ventricle demonstrates regional wall motion abnormalities. Definity contrast agent was given IV to delineate the left ventricular endocardial borders. The left ventricular internal cavity size was severely dilated. There is borderline left ventricular hypertrophy. Left ventricular diastolic parameters are indeterminate. Right Ventricle: The right ventricular size is moderately enlarged. Right vetricular wall thickness was not well visualized. Right ventricular systolic function is moderately reduced. Left Atrium: Left atrial size was moderately dilated. Right Atrium: Right atrial size was mildly dilated. Pericardium: There is no evidence of pericardial effusion. Mitral Valve: The mitral valve is degenerative in appearance. There is mild thickening of the mitral valve leaflet(s). There is mild calcification of the mitral valve leaflet(s). Mild to moderate mitral valve regurgitation. No evidence of mitral valve stenosis. Tricuspid Valve: The tricuspid valve is normal in structure. Tricuspid valve regurgitation is trivial. No evidence of tricuspid stenosis. Aortic Valve: The aortic valve is tricuspid. There is moderate calcification of the aortic valve. There is moderate thickening of the  aortic valve. Aortic valve regurgitation is not visualized. Mild aortic stenosis is present. Aortic valve mean gradient measures 16.0 mmHg. Aortic valve peak gradient measures 30.0 mmHg. Aortic valve area, by VTI measures 0.96 cm. Pulmonic Valve: The pulmonic valve was not well visualized. Pulmonic valve regurgitation is not visualized. Aorta: The  aortic root and ascending aorta are structurally normal, with no evidence of dilitation. Venous: The inferior vena cava is dilated in size with less than 50% respiratory variability, suggesting right atrial pressure of 15 mmHg. IAS/Shunts: The atrial septum is grossly normal.  LEFT VENTRICLE PLAX 2D LVIDd:         6.30 cm LVIDs:         4.30 cm LV PW:         1.20 cm LV IVS:        0.90 cm LVOT diam:     2.00 cm LV SV:         56 LV SV Index:   27 LVOT Area:     3.14 cm  RIGHT VENTRICLE            IVC RV Basal diam:  4.60 cm    IVC diam: 2.40 cm RV Mid diam:    3.90 cm RV S prime:     6.53 cm/s TAPSE (M-mode): 1.5 cm LEFT ATRIUM             Index       RIGHT ATRIUM           Index LA diam:        4.90 cm 2.37 cm/m  RA Area:     15.00 cm LA Vol (A2C):   90.3 ml 43.64 ml/m RA Volume:   42.80 ml  20.68 ml/m LA Vol (A4C):   87.5 ml 42.28 ml/m LA Biplane Vol: 89.3 ml 43.15 ml/m  AORTIC VALVE AV Area (Vmax):    1.00 cm AV Area (Vmean):   1.07 cm AV Area (VTI):     0.96 cm AV Vmax:           274.00 cm/s AV Vmean:          185.000 cm/s AV VTI:            0.576 m AV Peak Grad:      30.0 mmHg AV Mean Grad:      16.0 mmHg LVOT Vmax:         87.20 cm/s LVOT Vmean:        63.200 cm/s LVOT VTI:          0.177 m LVOT/AV VTI ratio: 0.31  AORTA Ao Root diam: 2.90 cm MITRAL VALVE MV Area (PHT): 5.13 cm     SHUNTS MV Decel Time: 148 msec     Systemic VTI:  0.18 m MV E velocity: 127.00 cm/s  Systemic Diam: 2.00 cm MV A velocity: 43.20 cm/s MV E/A ratio:  2.94 Buford Dresser MD Electronically signed by Buford Dresser MD Signature Date/Time: 04/10/2021/4:17:26 PM     Final    VAS [place] LOWER EXTREMITY VENOUS (DVT)  Result Date: 04/11/2021  Lower Venous DVT Study Patient Name:  FEDERICK LEVENE  Date of Exam:   04/11/2021 Medical Rec #: [id number]        Accession #:    [id number] Date of Birth: [birth year]        Patient Gender: M Patient Age:   68Y Exam Location:  [facility] Procedure:      VAS [place] LOWER EXTREMITY VENOUS (DVT) Referring Phys: [id number] PHILIP J NAHSER --------------------------------------------------------------------------------  Indications: Swelling, and CHF.  Limitations: Heavy overlying arterial calcification bilaterally. Comparison Study: No prior studies. Performing Technologist: [name] RDMS,RVT  Examination Guidelines: A complete evaluation includes B-mode imaging, spectral Doppler, color Doppler, and power Doppler as needed of all accessible portions  of each vessel. Bilateral testing is considered an integral part of a complete examination. Limited examinations for reoccurring indications may be performed as noted. The reflux portion of the exam is performed with the patient in reverse Trendelenburg.  +---------+---------------+---------+-----------+----------+--------------+ RIGHT    CompressibilityPhasicitySpontaneityPropertiesThrombus Aging +---------+---------------+---------+-----------+----------+--------------+ CFV      Full           Yes      Yes                                 +---------+---------------+---------+-----------+----------+--------------+ SFJ      Full                                                        +---------+---------------+---------+-----------+----------+--------------+ FV Prox  Full                                                        +---------+---------------+---------+-----------+----------+--------------+ FV Mid   Full                                                        +---------+---------------+---------+-----------+----------+--------------+ FV DistalFull                                                         +---------+---------------+---------+-----------+----------+--------------+ PFV      Full                                                        +---------+---------------+---------+-----------+----------+--------------+ POP      Full           Yes      Yes                                 +---------+---------------+---------+-----------+----------+--------------+ PTV      Full                                                        +---------+---------------+---------+-----------+----------+--------------+ PERO     Full                                                        +---------+---------------+---------+-----------+----------+--------------+   +---------+---------------+---------+-----------+----------+--------------+ LEFT  CompressibilityPhasicitySpontaneityPropertiesThrombus Aging +---------+---------------+---------+-----------+----------+--------------+ CFV      Full           Yes      Yes                                 +---------+---------------+---------+-----------+----------+--------------+ SFJ      Full                                                        +---------+---------------+---------+-----------+----------+--------------+ FV Prox  Full                                                        +---------+---------------+---------+-----------+----------+--------------+ FV Mid   Full                                                        +---------+---------------+---------+-----------+----------+--------------+ FV DistalFull                                                        +---------+---------------+---------+-----------+----------+--------------+ PFV      Full                                                        +---------+---------------+---------+-----------+----------+--------------+ POP      Full           Yes      Yes                                  +---------+---------------+---------+-----------+----------+--------------+ PTV      Full                                                        +---------+---------------+---------+-----------+----------+--------------+ PERO     Full                                                        +---------+---------------+---------+-----------+----------+--------------+     Summary: RIGHT: - There is no evidence of deep vein thrombosis in the lower extremity.  - No cystic structure found in the popliteal fossa.  LEFT: - There is no evidence of deep vein thrombosis in the lower extremity.  - No cystic structure found in the  popliteal fossa.  *See table(s) above for measurements and observations.    Preliminary    . atorvastatin  80 mg Oral Daily  . Chlorhexidine Gluconate Cloth  6 each Topical Q0600  . escitalopram  10 mg Oral Daily  . gentamicin cream  1 application Topical Daily  . heparin  5,000 Units Subcutaneous Q8H  . insulin aspart  0-6 Units Subcutaneous TID WC  . pantoprazole (PROTONIX) IV  40 mg Intravenous QHS  . vancomycin variable dose per unstable renal function (pharmacist dosing)   Does not apply See admin instructions    BMET    Component Value Date/Time   NA 135 04/11/2021 0114   K 3.0 (L) 04/11/2021 0114   CL 98 04/11/2021 0114   CO2 26 04/11/2021 0114   GLUCOSE 101 (H) 04/11/2021 0114   BUN 33 (H) 04/11/2021 0114   CREATININE 4.44 (H) 04/11/2021 0114   CALCIUM 8.2 (L) 04/11/2021 0114   GFRNONAA 14 (L) 04/11/2021 0114   GFRAA 10 (L) 01/11/2019 0523   CBC    Component Value Date/Time   WBC 3.8 (L) 04/11/2021 0114   RBC 2.80 (L) 04/11/2021 0114   HGB 8.7 (L) 04/11/2021 0114   HCT 26.9 (L) 04/11/2021 0114   PLT 99 (L) 04/11/2021 0114   MCV 96.1 04/11/2021 0114   MCH 31.1 04/11/2021 0114   MCHC 32.3 04/11/2021 0114   RDW 15.0 04/11/2021 0114     Dialysis Orders: Center: [facility], [place], [place]  on CCPD with 3 exchanges, 2700 ml  fill volume, fill time 30 minutes, drain time 30 minutes, dwell time 2 hours.  NEPHROLOGIST: Claris Gladden  LOCATION: [facility]  SCHEDULE: M-W-F 2nd Shift  EDW: TBD kg.  KIDNEY:  LITERS PROC: liters/treatment  HD TIME: 4 hrs. 0 min.  ACCESS: L Upper Arm AVF  NEEDLE SIZE: 15 ga.  ANTICOAG: Standard Heparin Per Protocol  BATH: 2.0 K-2.5Ca 35 HCO3  QB: 500 ml/min  QD: Auto Flow Protocol    Assessment/Plan: 1. NSTEMI - Cardiology consulted and has EKG changes.  ECHO with significant decrease in EF from April 2022.  Will likely require LHC this admission. 2. Acute systolic CHF - EF 10-25% (down from 55% in April).  Started on levophed yesterday and tolerated 2 liters UF with HD last night.  Heart failure team consulted.   3.  ESRD -  Normally does CCPD at home but had issues with machine and draining so had sporadic treatments and was set up for IHD day he presented to [facility] ED.  He tolerated HD well last night with UF of 2 liters.   1. Plan to transition to CCPD tonight and see how his treatment goes.  If he continues to have issues with draining, will then resume IHD on Wednesday.   2. Given miralax daily to help with function of PD. 4.  Hypertension/volume  - volume overload but low BP, will UF as tolerated.  ECHO with drop in EF started on levophed with improved BP.  Currently off levophed. 5.  Anemia  - due to ESRD.  Will dose with ESA and transfuse prn. 6.  Metabolic bone disease -  Continue with home meds 7.  Nutrition -  Renal diet, carb modified 8. Vascular access:  LUE AVF +T/B 9. Diabetic heel/foot ulcer - Vascular surgery consulted as well as Dr. Sharol Given.  Currently on vanc/zosyn.  10. Hypokalemia - will replete with IV KCl x 2 runs and follow 11. Hypomagnesemia - will replete  with 2 grams IV and follow.    Donetta Potts, MD [employer] [phone]

## 2021-04-11 NOTE — Progress Notes (Signed)
Lower extremity venous bilateral and ankle brachial index studies completed.   Please see CV Proc for preliminary results.   [name], RDMS, RVT

## 2021-04-11 NOTE — Progress Notes (Signed)
Pt HD tx finished, 2.2L removed per HD nurse,   Pt having new onset afib around 0240, Cardiology paged and made aware, HR<120 pt denies symptoms or discomfort, no new orders,   VSS otherwise, continuing assess and monitor

## 2021-04-11 NOTE — Progress Notes (Addendum)
   VASCULAR SURGERY ASSESSMENT & PLAN:   PAD/CLI: tissue loss of left foot. Ongoing optimization of medical issues with tentative plans for aortogram with LLE run-off tomorrow with Dr. Stanford Breed. Plavix continues. Hgb 8.7. No fever or leukocytosis.  CAD: No chest pain, DOE. New onset of atrial fibrillation overnight. Spontaneous conversion to NSR without symptoms. Possible heart cath this admission per cardiology.  ESRD: HD via LUE brachiocephalic AVF. Treatment yesterday without apparent complications.   SUBJECTIVE:   Didn't sleep well. No rest pain in left foot.  PHYSICAL EXAM:   Vitals:   04/11/21 0545 04/11/21 0600 04/11/21 0615 04/11/21 0650  BP:      Pulse:  (!) 102 94   Resp: (!) 29 (!) 24 (!) 48   Temp:    98.4 F (36.9 C)  TempSrc:    Oral  SpO2:  96% 94%   Weight:       General appearance: Awake, alert in no apparent distress Cardiac: Heart rate and rhythm are regular Respirations: Nonlabored; mild tachypnea Extremities: Both feet are warm with intact sensation and motor function.  Left heel and great toe wounds changes noted and unchanged. Pulse/Doppler exam:  Brisk dorsalis pedis, posterior tibial artery Doppler signals bilaterally.  LABS:   Lab Results  Component Value Date   WBC 3.8 (L) 04/11/2021   HGB 8.7 (L) 04/11/2021   HCT 26.9 (L) 04/11/2021   MCV 96.1 04/11/2021   PLT 99 (L) 04/11/2021   Lab Results  Component Value Date   CREATININE 4.44 (H) 04/11/2021   Lab Results  Component Value Date   INR 1.2 04/10/2021   CBG (last 3)  Recent Labs    04/10/21 2235 04/11/21 0312 04/11/21 0650  GLUCAP 87 88 77    PROBLEM LIST:    Active Problems:   Acute heart failure (HCC)   Uremia   CURRENT MEDS:   . atorvastatin  80 mg Oral Daily  . Chlorhexidine Gluconate Cloth  6 each Topical Q0600  . heparin  5,000 Units Subcutaneous Q8H  . insulin aspart  0-6 Units Subcutaneous TID WC  . pantoprazole (PROTONIX) IV  40 mg Intravenous QHS  .  vancomycin variable dose per unstable renal function (pharmacist dosing)   Does not apply See admin instructions   Barbie Banner, PA-C Office: [phone] 04/11/2021   VASCULAR STAFF ADDENDUM: I have independently interviewed and examined the patient. I agree with the above.  Does not appear ready for angiogram at the moment. Will allow acute issues to resolve before scheduling. Following.  Yevonne Aline. Stanford Breed, MD [facility] Phone Number: [phone] 04/11/2021 12:31 PM

## 2021-04-11 NOTE — Progress Notes (Signed)
Progress Note  Patient Name: Maxwell Harmon Date of Encounter: 04/11/2021  [facility] Cardiologist: Tania Steinhauser   Subjective   65 yo with hx of CAD, CABG, ESRD Admitted with progressive fatigue, altered mental status,  CHF Has significant PAD Has a nonhealing ulcer in his left heal  Is awake / fairly alert this am    Inpatient Medications    Scheduled Meds: . atorvastatin  80 mg Oral Daily  . Chlorhexidine Gluconate Cloth  6 each Topical Q0600  . heparin  5,000 Units Subcutaneous Q8H  . insulin aspart  0-6 Units Subcutaneous TID WC  . pantoprazole (PROTONIX) IV  40 mg Intravenous QHS  . vancomycin variable dose per unstable renal function (pharmacist dosing)   Does not apply See admin instructions   Continuous Infusions: . sodium chloride    . sodium chloride    . sodium chloride    . norepinephrine (LEVOPHED) Adult infusion    . piperacillin-tazobactam (ZOSYN)  IV 2.25 g (04/11/21 0702)   PRN Meds: sodium chloride, sodium chloride, alteplase, docusate sodium, heparin, heparin, lidocaine (PF), lidocaine-prilocaine, pentafluoroprop-tetrafluoroeth, polyethylene glycol   Vital Signs    Vitals:   04/11/21 0600 04/11/21 0615 04/11/21 0650 04/11/21 0733  BP:      Pulse: (!) 102 94    Resp: (!) 24 (!) 48    Temp:   98.4 F (36.9 C) 98.9 F (37.2 C)  TempSrc:   Oral   SpO2: 96% 94%    Weight:        Intake/Output Summary (Last 24 hours) at 04/11/2021 0833 Last data filed at 04/11/2021 0245 Gross per 24 hour  Intake 350 ml  Output 2246 ml  Net -1896 ml   Last 3 Weights 04/10/2021 04/10/2021 01/11/2019  Weight (lbs) 209 lb 15.8 oz 210 lb 195 lb 15.8 oz  Weight (kg) 95.25 kg 95.255 kg 88.9 kg      Telemetry    NSR  - Personally Reviewed  ECG     - Personally Reviewed  Physical Exam   GEN: No acute distress.   Neck: No JVD Cardiac: RRR,  2/6 systolic murmur radiating to axilla  Respiratory: Clear to auscultation bilaterally. GI: Soft, nontender,  non-distended  MS: No edema; No deformity. Neuro:  Nonfocal  Psych: Normal affect   Labs    High Sensitivity Troponin:   Recent Labs  Lab 04/10/21 1033 04/10/21 1233 04/10/21 1725 04/10/21 2132 04/11/21 0114  TROPONINIHS 1,294* 1,335* 1,325* 1,336* 1,383*      Chemistry Recent Labs  Lab 04/10/21 1033 04/10/21 1725 04/10/21 1725 04/10/21 2118 04/10/21 2131 04/10/21 2224 04/11/21 0114  NA 136  --   --  136  --   --  135  K 4.5  --   --  4.3  --   --  3.0*  CL 98  --   --  97*  --   --  98  CO2 23  --   --  25  --   --  26  GLUCOSE 106*  --   --  57*  --   --  101*  BUN 82*  --   --  87*  --   --  33*  CREATININE 9.76*  --   --  10.25*  --   --  4.44*  CALCIUM 8.7*  --   --  8.1*  --   --  8.2*  PROT  --  5.5*  --   --  5.7* 5.2*  --  ALBUMIN  --  2.3*   < > 2.4* 2.3* 2.2*  --   AST  --  23  --   --  29 23  --   ALT  --  15  --   --  14 15  --   ALKPHOS  --  86  --   --  79 85  --   BILITOT  --  0.7  --   --  1.0 1.0  --   GFRNONAA 5*  --   --  5*  --   --  14*  ANIONGAP 15  --   --  14  --   --  11   < > = values in this interval not displayed.     Hematology Recent Labs  Lab 04/10/21 1033 04/10/21 2132 04/10/21 2133 04/11/21 0114  WBC 5.9  --  4.4 3.8*  RBC 2.93*  --  2.85* 2.80*  HGB 9.1*  --  8.7* 8.7*  HCT 29.0*  --  28.2* 26.9*  MCV 99.0  --  98.9 96.1  MCH 31.1  --  30.5 31.1  MCHC 31.4  --  30.9 32.3  RDW 14.9  --  15.0 15.0  PLT 109* 97* 113* 99*    BNP Recent Labs  Lab 04/10/21 1033  BNP 4,239.3*     DDimer  Recent Labs  Lab 04/10/21 2132  DDIMER 2.86*     Radiology    DG Chest 2 View  Result Date: 04/10/2021 CLINICAL DATA:  Shortness of breath EXAM: CHEST - 2 VIEW COMPARISON:  04/13/2020 FINDINGS: Prior CABG. Mild cardiomegaly and vascular congestion. Bibasilar atelectasis. Suspect small effusions. No acute bony abnormality. IMPRESSION: Mild cardiomegaly, vascular congestion. Bibasilar atelectasis with small effusions.  Electronically Signed   By: Rolm Baptise M.D.   On: 04/10/2021 11:00   DG Tibia/Fibula Left  Result Date: 04/10/2021 CLINICAL DATA:  Left lower extremity nonhealing wound EXAM: LEFT TIBIA AND FIBULA - 2 VIEW COMPARISON:  None. FINDINGS: Three view radiograph left knee demonstrates normal alignment. No fracture or dislocation. No osseous erosion. No effusion. Advanced vascular calcifications are seen throughout the left lower extremity. IMPRESSION: Peripheral vascular disease.  No osseous erosion. Electronically Signed   By: Fidela Salisbury MD   On: 04/10/2021 20:03   DG Foot 2 Views Left  Result Date: 04/10/2021 CLINICAL DATA:  Diabetic foot wound EXAM: LEFT FOOT - 2 VIEW COMPARISON:  None. FINDINGS: Two view radiograph left foot demonstrates normal alignment. No fracture or dislocation. No osseous erosion. Advanced vascular calcifications are seen throughout the left foot. Moderate plantar calcaneal spur. Joint spaces are preserved. IMPRESSION: Peripheral vascular disease with advanced vascular calcifications throughout the left foot. No osseous erosion. Electronically Signed   By: Fidela Salisbury MD   On: 04/10/2021 20:02   ECHOCARDIOGRAM COMPLETE  Result Date: 04/10/2021    ECHOCARDIOGRAM REPORT   Patient Name:   Maxwell Harmon Date of Exam: 04/10/2021 Medical Rec #:  [id number]       Height:       70.0 in Accession #:    [id number]      Weight:       196.0 lb Date of Birth:  1956/10/04       BSA:          2.069 m Patient Age:    38 years        BP:           111/56 mmHg Patient Gender: M  HR:           67 bpm. Exam Location:  Inpatient Procedure: 2D Echo, Cardiac Doppler, Color Doppler and Intracardiac            Opacification Agent Indications:    A83.41 Acute diastolic (congestive) heart failure  History:        Patient has no prior history of Echocardiogram examinations.                 Prior CABG, Signs/Symptoms:Shortness of Breath and Confusion;                 Risk Factors:Diabetes. ESRD  on HD. LEE.  Sonographer:    Merrie Roof RDCS Referring Phys: [phone] Akaya Proffit J Johanny Segers IMPRESSIONS  1. LV function appears preserved at the base and diminished moving toward apex, akinetic at the apex. This is across all segments. Differential includes takotsubo's cardiomyopathy vs. multivessel CAD. LV thrombus excluded by contrast. Left ventricular ejection fraction, by estimation, is 30 to 35%. The left ventricle has moderately decreased function. The left ventricle demonstrates regional wall motion abnormalities (see scoring diagram/findings for description). The left ventricular internal cavity size was severely dilated. Left ventricular diastolic parameters are indeterminate.  2. Right ventricular systolic function is moderately reduced. The right ventricular size is moderately enlarged.  3. Left atrial size was moderately dilated.  4. Right atrial size was mildly dilated.  5. The mitral valve is degenerative. Mild to moderate mitral valve regurgitation. No evidence of mitral stenosis.  6. The aortic valve is tricuspid. There is moderate calcification of the aortic valve. There is moderate thickening of the aortic valve. Aortic valve regurgitation is not visualized. Mild aortic valve stenosis.  7. The inferior vena cava is dilated in size with <50% respiratory variability, suggesting right atrial pressure of 15 mmHg. Comparison(s): No prior Echocardiogram. Conclusion(s)/Recommendation(s): Severely reduced LVEF with worse wall motion at the apex compared to the base. Concerning for takotsubo's vs. multivessel CAD/apical LAD. Findings communicated with Dr. Acie Fredrickson. FINDINGS  Left Ventricle: LV function appears preserved at the base and diminished moving toward apex, akinetic at the apex. This is across all segments. Differential includes takotsubo's cardiomyopathy vs. multivessel CAD. LV thrombus excluded by contrast. Left ventricular ejection fraction, by estimation, is 30 to 35%. The left ventricle has moderately  decreased function. The left ventricle demonstrates regional wall motion abnormalities. Definity contrast agent was given IV to delineate the left ventricular endocardial borders. The left ventricular internal cavity size was severely dilated. There is borderline left ventricular hypertrophy. Left ventricular diastolic parameters are indeterminate. Right Ventricle: The right ventricular size is moderately enlarged. Right vetricular wall thickness was not well visualized. Right ventricular systolic function is moderately reduced. Left Atrium: Left atrial size was moderately dilated. Right Atrium: Right atrial size was mildly dilated. Pericardium: There is no evidence of pericardial effusion. Mitral Valve: The mitral valve is degenerative in appearance. There is mild thickening of the mitral valve leaflet(s). There is mild calcification of the mitral valve leaflet(s). Mild to moderate mitral valve regurgitation. No evidence of mitral valve stenosis. Tricuspid Valve: The tricuspid valve is normal in structure. Tricuspid valve regurgitation is trivial. No evidence of tricuspid stenosis. Aortic Valve: The aortic valve is tricuspid. There is moderate calcification of the aortic valve. There is moderate thickening of the aortic valve. Aortic valve regurgitation is not visualized. Mild aortic stenosis is present. Aortic valve mean gradient measures 16.0 mmHg. Aortic valve peak gradient measures 30.0 mmHg. Aortic valve area, by VTI measures 0.96  cm. Pulmonic Valve: The pulmonic valve was not well visualized. Pulmonic valve regurgitation is not visualized. Aorta: The aortic root and ascending aorta are structurally normal, with no evidence of dilitation. Venous: The inferior vena cava is dilated in size with less than 50% respiratory variability, suggesting right atrial pressure of 15 mmHg. IAS/Shunts: The atrial septum is grossly normal.  LEFT VENTRICLE PLAX 2D LVIDd:         6.30 cm LVIDs:         4.30 cm LV PW:          1.20 cm LV IVS:        0.90 cm LVOT diam:     2.00 cm LV SV:         56 LV SV Index:   27 LVOT Area:     3.14 cm  RIGHT VENTRICLE            IVC RV Basal diam:  4.60 cm    IVC diam: 2.40 cm RV Mid diam:    3.90 cm RV S prime:     6.53 cm/s TAPSE (M-mode): 1.5 cm LEFT ATRIUM             Index       RIGHT ATRIUM           Index LA diam:        4.90 cm 2.37 cm/m  RA Area:     15.00 cm LA Vol (A2C):   90.3 ml 43.64 ml/m RA Volume:   42.80 ml  20.68 ml/m LA Vol (A4C):   87.5 ml 42.28 ml/m LA Biplane Vol: 89.3 ml 43.15 ml/m  AORTIC VALVE AV Area (Vmax):    1.00 cm AV Area (Vmean):   1.07 cm AV Area (VTI):     0.96 cm AV Vmax:           274.00 cm/s AV Vmean:          185.000 cm/s AV VTI:            0.576 m AV Peak Grad:      30.0 mmHg AV Mean Grad:      16.0 mmHg LVOT Vmax:         87.20 cm/s LVOT Vmean:        63.200 cm/s LVOT VTI:          0.177 m LVOT/AV VTI ratio: 0.31  AORTA Ao Root diam: 2.90 cm MITRAL VALVE MV Area (PHT): 5.13 cm     SHUNTS MV Decel Time: 148 msec     Systemic VTI:  0.18 m MV E velocity: 127.00 cm/s  Systemic Diam: 2.00 cm MV A velocity: 43.20 cm/s MV E/A ratio:  2.94 Buford Dresser MD Electronically signed by Buford Dresser MD Signature Date/Time: 04/10/2021/4:17:26 PM    Final     Cardiac Studies      Patient Profile     65 y.o. male with CAD, acute on chronic combined CHF, ESRD   Assessment & Plan    1.   Acute on chronic combined CHF:   EF was reported to be 55% in April by echo at [facility] .   EF30-35% here at [facility] on April 10, 2021 Has been stabilized by critical care medicine  I've asked the CHF team to see today   2.   CAD :  No angina.  Given his reduced EF, I would favor R and L heart cath this admission  3.   ESRD:  Cont HD.  Further management per nephrology   4.  Altered mental status:   May be uremia.   Further management per primary team     For questions or updates, please contact [place] Please consult [email or web address] for contact  info under        Signed, Mertie Moores, MD  04/11/2021, 8:33 AM

## 2021-04-11 NOTE — Progress Notes (Signed)
Pharmacy Antibiotic Note  Maxwell Harmon is a 65 y.o. male admitted on 04/10/2021 with sepsis.  Pharmacy has been consulted for Zosyn and vancomycin dosing.  WBC wnl, h/o ESRD on peritoneal dialysis at home but recently underwent HD given problems with peritoneal dialysis at home.   Plan: -Give vancomycin 1500mg  x1 today to supplement HD session -per nephrology, will attempt PD session tonight and go back to home schedule -F/u dialysis plans and order vancomycin dose per plans. Will dose off levels for now -Monitor CBC, renal fx, cultures and clinical progress  Weight: 95.2 kg (209 lb 15.8 oz)  Temp (24hrs), Avg:98.3 F (36.8 C), Min:97.6 F (36.4 C), Max:98.9 F (37.2 C)  Recent Labs  Lab 04/10/21 1033 04/10/21 1819 04/10/21 2118 04/10/21 2133 04/10/21 2224 04/11/21 0114  WBC 5.9  --   --  4.4  --  3.8*  CREATININE 9.76*  --  10.25*  --   --  4.44*  LATICACIDVEN  --  1.0  --   --  0.9  --     CrCl cannot be calculated (Unknown ideal weight.).    No Known Allergies  Antimicrobials this admission: Zosyn 6/6 >>  Vancomycin 6/6 >>   Dose adjustments this admission:  Microbiology results: 6/6 BCx:   Thank you for allowing pharmacy to be a part of this patient's care.  Joetta Manners, PharmD, [facility] Clinical Pharmacist  Please check AMION for all [place] phone numbers After 10:00 PM, call [place] [phone]

## 2021-04-11 NOTE — Progress Notes (Signed)
NAME:  Maxwell Harmon, MRN:  [id number], DOB:  [birth year], LOS: 1 ADMISSION DATE:  04/10/2021, CONSULTATION DATE:  6/6 REFERRING MD:  Dareen Piano CHIEF COMPLAINT:  Confusion  History of Present Illness:  65 y/o male with an extensive past medical history presented to the [facility] ER on 6/6 with a chief complaint of confusion and weakness.  He has a history of CAD s/p CABG, ESRD on peritoneal dialysis and recently switched to intermittent hemodialysis (last week) because PD fluid was not draining appropriately.  Over the last several weeks he has complained of increasing confusion and weakness.  This has been associated with worsening orthopnea and dyspnea.  His wife notes that he has been more confused and lethargic than normal.  In the midst of this he has been having more left foot pain due to a diabetic foot and ulcer on his left heel.  He was started on neurontin but this was held because of worsening confusion (last dose over 2 weeks ago).  His wife notes that his left foot has been more red lately.  Denied recent chest pain, shortness of breath, fever or chills.    CABG performed at [facility] in 2021  Over the last few months he has been worked up for a renal transplant.  At [facility] he was noted to have an EF of 38-42% on a myoview study in 12/2020 which also showed a small area of reversible ischemia, however a stress echo in 02/2021 showed an EF of ~55%.   Pertinent  Medical History  CAD s/p CABG x3  06/02/2020 ESRD on peritoneal hemodialysis > now on hemodialysis, still make some urine Hypertension DM2 Charcot foot deformity TIA  Bleeding ulcers Anemia of chronic disease  Significant Hospital Events: Including procedures, antibiotic start and stop dates in addition to other pertinent events   . 6/6 admission, cardiology consulted, Dr. Sharol Given and vascular surgery consulted for LLE, s/p iHD overnight with 2.2L off after midodrine    Interim History / Subjective:   States feels poorly-  didn't sleep well overnight.  Denies any current CP/ SOB, currently on RA  iHD overnight- 2.2 L off s/p 1 dose midodrine, one unmeasured urinary occurrence   Documented SBP's overnight have ranged from 70-130, however BP cuff accurately placed this morning with consistent good readings.   tmax 98.9  TTE 6/6 showing new reduced EF and RV dysfunction, mild AS, mild to moderate MR, and wall motion abnormalities concerning for  takostsubo's vs multivessel CAD -> HF team consulted by cards; troponin hs trend flat ~1300   Objective   Blood pressure (!) 110/59, pulse 94, temperature 98.9 F (37.2 C), resp. rate (!) 48, weight 95.2 kg, SpO2 94 %.        Intake/Output Summary (Last 24 hours) at 04/11/2021 0823 Last data filed at 04/11/2021 0245 Gross per 24 hour  Intake 350 ml  Output 2246 ml  Net -1896 ml   Filed Weights   04/10/21 2145 04/10/21 2230  Weight: 95.3 kg 95.2 kg    Examination: General:  Chronically ill appearing male sitting upright in bed in NAD HEENT: MM pink/moist, pupils 3/reactive, anicteric, wearing glasses Neuro: Awake, oriented x 3, MAE, generalized weakness CV:  NSR, +diffuse murmur at L/RSB and apex  PULM:  Non labored, on room air, clear, diminished in bases GI: soft, +BS, NT, PD site wnl Extremities: warm/dry, LUE AVF +B/T, right charot foot, +1 tibial edema, left lateral unstable ulceration with some erythema, no drainage, mild tenderness,  small left first toe with unstageable ulceration- dry, faint DP pulses Skin: no rashes  Labs/imaging that I havepersonally reviewed  (right click and "Reselect all SmartList Selections" daily)  WBC 5.9 -> 3.8 Hgb stable, plts 109-> 99  Trop 1294- 1335- 1325- 1336- 1383 Cr 9.7, BUN 82  Left foot XR 6/6 >>Peripheral vascular disease with advanced vascular calcifications throughout the left foot. No osseous erosion. Left Tib/fib 6/6 >> Peripheral vascular disease.  No osseous erosion.  TTE 6/6 >> LVEF 30-35%, akinetic at  the apex, diminished function moving toward apex, then akinetic at the apex, concerning for takostsubo's vs multivessel CAD, LV thrombus excluded by contrast; diastolic parameters indeterminate, RV systolic function moderately reduced and mod enlarged, mild to moderate MR, mild AS, RA pressure 15 mmHg  Resolved Hospital Problem list     Assessment & Plan:  Acute metabolic encephalopathy due to uremia - resolved,  s/p iHD 6/6 overnight - continue holding home vicodin (prn at home), add prn reduced dose klonopin 0.125mg  BID prn (avoid benzo withdrawal), and restart home lexapro 10mg  daily - continue to monitor - LFTs ok  Hypotension- undifferentiated, ddx cardiogenic +/- septic (w/ left diabetic foot) vs poor cuff placement New HFrEF with RV dysfunction  NSTEMI  Hx CAD s/p CABG x 3 in 05/2020 at [facility] Hx HTN HLD - Cardiology follow; HF consulted this am  - TTE as above- new reduced LVEF and RV dysfunction from prior TTE at [facility] in April 2022 - repeat lactic overnight 1-> 0.9 - currently remains normotensive off pressors, prn peripheral NE if needed - awaiting HF recs- ? R/LHC +/- central line placement for coox - continue abx - vanc/ cefepime for now per pharmacy - following blood cultures - remains afebrile, WBC 4.4->3.8 - holding home clonidine 0.2 BID, hydralazine 100mg  TID, toprol XL 25mg  daily given soft Bps, defer to cards - statin as below  Left Diabetic foot with infection - Dr. Sharol Given consulted 6/6 - vascular consulted, Dr. Stanford Breed with plans to take for left lower extremity angiogram either today or tomorrow - pending ABI and LLE venous duplex  - statin and daily ASA 81mg  and BP goal < 140/90 per vascular surgery recs  - continue vanc/ zosyn, following blood cultures - XR Left foot/ tib/ fib neg for osteo  ESRD Hypokalemia - previously on home PD, recently switched to iHD last week given poor fluid return - s/p iHD overnight 6/6 w 2.2L off and x 1 unmeasured urinary  occurrence - per Nephrology- will attempt PD tonight- to trouble shoot PD cath problems - K 3, mag 1.7- to be repleted per Nephrology, goal K > 4, Mag > 2 with HFrEF - miralax added per Nephrology to see if will help with PD cath flow/ constipation - trend renal indices/ monitor I/Os  DM2, uncontrolled - 6/6 A1c 8.2 - continue SSI very sensitive, CBGs in 70-80s  Thrombocytopenia: unclear etiology, ? Chronic, 121 in 12/2018 - DIC panel neg - trend CBC  Normocytic anemia/ chronic illness  - no evidence of bleeding - H/H stable, trend on CBC  Best practice (right click and "Reselect all SmartList Selections" daily)  Diet:  NPO for now pending further studies today Pain/Anxiety/Delirium protocol (if indicated): No VAP protocol (if indicated): Not indicated DVT prophylaxis: Subcutaneous Heparin GI prophylaxis: N/A Glucose control:  SSI Yes Central venous access:  N/A Arterial line:  N/A Foley:  N/A Mobility:  bed rest  PT consulted: N/A Last date of multidisciplinary goals of care discussion, pt  updated on plan of care today, no family at bedside Code Status:  full code Disposition: ICU for now, may be able to transfer to PCU later today pending procedures   Labs   CBC: Recent Labs  Lab 04/10/21 1033 04/10/21 2132 04/10/21 2133 04/11/21 0114  WBC 5.9  --  4.4 3.8*  HGB 9.1*  --  8.7* 8.7*  HCT 29.0*  --  28.2* 26.9*  MCV 99.0  --  98.9 96.1  PLT 109* 97* 113* 99*    Basic Metabolic Panel: Recent Labs  Lab 04/10/21 1033 04/10/21 2118 04/11/21 0114  NA 136 136 135  K 4.5 4.3 3.0*  CL 98 97* 98  CO2 23 25 26   GLUCOSE 106* 57* 101*  BUN 82* 87* 33*  CREATININE 9.76* 10.25* 4.44*  CALCIUM 8.7* 8.1* 8.2*  MG  --   --  1.7  PHOS  --  8.1* 3.5   GFR: CrCl cannot be calculated (Unknown ideal weight.). Recent Labs  Lab 04/10/21 1033 04/10/21 1819 04/10/21 2133 04/10/21 2224 04/11/21 0114  WBC 5.9  --  4.4  --  3.8*  LATICACIDVEN  --  1.0  --  0.9  --      Liver Function Tests: Recent Labs  Lab 04/10/21 1725 04/10/21 2118 04/10/21 2131 04/10/21 2224  AST 23  --  29 23  ALT 15  --  14 15  ALKPHOS 86  --  79 85  BILITOT 0.7  --  1.0 1.0  PROT 5.5*  --  5.7* 5.2*  ALBUMIN 2.3* 2.4* 2.3* 2.2*   Recent Labs  Lab 04/10/21 2131  LIPASE 25  AMYLASE 27*   No results for input(s): AMMONIA in the last 168 hours.  ABG No results found for: PHART, PCO2ART, PO2ART, HCO3, TCO2, ACIDBASEDEF, O2SAT   Coagulation Profile: Recent Labs  Lab 04/10/21 2132  INR 1.2    Cardiac Enzymes: No results for input(s): CKTOTAL, CKMB, CKMBINDEX, TROPONINI in the last 168 hours.  HbA1C: Hemoglobin A1C  Date/Time Value Ref Range Status  11/08/2015 12:00 AM 7.5  Final   Hgb A1c MFr Bld  Date/Time Value Ref Range Status  04/10/2021 04:40 PM 8.2 (H) 4.8 - 5.6 % Final    Comment:    (NOTE)         Prediabetes: 5.7 - 6.4         Diabetes: >6.4         Glycemic control for adults with diabetes: <7.0   01/02/2019 11:53 AM 6.9 (H) 4.8 - 5.6 % Final    Comment:    (NOTE) Pre diabetes:          5.7%-6.4% Diabetes:              >6.4% Glycemic control for   <7.0% adults with diabetes     CBG: Recent Labs  Lab 04/10/21 2235 04/11/21 0312 04/11/21 [place]          Kennieth Rad, ACNP [facility] 04/11/2021, 8:24 AM

## 2021-04-11 NOTE — Consult Note (Addendum)
Advanced Heart Failure Team Consult Note   Primary Physician: [name], DO PCP-Cardiologist:  None  Reason for Consultation: Heart Failure   HPI:    Maxwell Harmon is seen today for evaluation of heart failure at the request of Maxwell Acie Fredrickson.   Maxwell Harmon is a 65 year old *son in law Maxwell Harmon) with a history of DM, PAD, charcot r foot, CAD, S/P CABG 2021, ESRD (one kidney removed for malignancy), previously on PD--> iHD .   Followed by [facility] for renal transplant.  At [facility] he was noted to have an EF of 38-42% on a myoview study in 12/2020 which also showed a small area of reversible ischemia, however a stress echo in 02/2021 showed an EF of ~55%.   Previously on PD and last week was switched to iHD last week. Says he was not able to drain effectively. Has also been placed on neurontin for left foot pain. Limited mobility due to R and L foot wounds. Unable to place weight on heels. Denies chest pain.  Developed  leg edema. Developed AMS/weakness associated with orthopnea and dyspnea.   Admitted to ICU with acute metabolic encephalopathy due to uremia.Hypotensive initially. Concern for sepsis. Ortho consulted for charcot foot.  LLE concerning for ischemia so Vascular consulted. Started on IV antibiotics.  CXR with vascular congestion. BNP 4239. HS Trop [id number]. Cardiology consulted. ECHO performed and showed EF 30-35% and RV moderately reduced.   Neprology consulted for HD.  Bld CX- NGTD Lactic Acid  1  Complaining of nausea.   Review of Systems: [y] = yes, '[ ]'  = no   . General: Weight gain [Y ]; Weight loss '[ ]' ; Anorexia '[ ]' ; Fatigue [Y ]; Fever '[ ]' ; Chills '[ ]' ; Weakness [Y ]  . Cardiac: Chest pain/pressure '[ ]' ; Resting SOB '[ ]' ; Exertional SOB [Y ]; Orthopnea [ Y]; Pedal Edema [ Y]; Palpitations '[ ]' ; Syncope '[ ]' ; Presyncope '[ ]' ; Paroxysmal nocturnal dyspnea'[ ]'   . Pulmonary: Cough '[ ]' ; Wheezing'[ ]' ; Hemoptysis'[ ]' ; Sputum '[ ]' ; Snoring '[ ]'   . GI: Vomiting'[ ]' ; Dysphagia'[ ]' ;  Melena'[ ]' ; Hematochezia '[ ]' ; Heartburn'[ ]' ; Abdominal pain '[ ]' ; Constipation '[ ]' ; Diarrhea '[ ]' ; BRBPR '[ ]'   . GU: Hematuria'[ ]' ; Dysuria '[ ]' ; Nocturia'[ ]'   . Vascular: Pain in legs with walking [Y ]; Pain in feet with lying flat '[ ]' ; Non-healing sores [Y ]; Stroke '[ ]' ; TIA '[ ]' ; Slurred speech '[ ]' ;  . Neuro: Headaches'[ ]' ; Vertigo'[ ]' ; Seizures'[ ]' ; Paresthesias'[ ]' ;Blurred vision '[ ]' ; Diplopia '[ ]' ; Vision changes '[ ]'   . Ortho/Skin: Arthritis Maxwell.Harmon ]; Joint pain [ Y]; Muscle pain '[ ]' ; Joint swelling '[ ]' ; Back Pain '[ ]' ; Rash '[ ]'   . Psych: Depression'[ ]' ; Anxiety'[ ]'   . Heme: Bleeding problems '[ ]' ; Clotting disorders '[ ]' ; Anemia '[ ]'   . Endocrine: Diabetes [ Y]; Thyroid dysfunction'[ ]'   Home Medications Prior to Admission medications   Medication Sig Start Date End Date Taking? Authorizing Provider  atorvastatin (LIPITOR) 20 MG tablet Take 20 mg by mouth daily.    [provider]  blood glucose meter kit and supplies KIT Dispense based on patient and insurance preference. Use up to four times daily as directed. (FOR ICD-9 250.00, 250.01). 11/08/15   Cassandria Anger, MD  Blood Glucose Monitoring Suppl (BAYER CONTOUR MONITOR) w/Device KIT 1 each by Does not apply route 4 (four) times daily. Test 4 x daily 11/08/15  Cassandria Anger, MD  clonazePAM (KLONOPIN) 0.5 MG tablet Take 0.5 mg by mouth daily as needed for anxiety.    [provider]  cloNIDine (CATAPRES) 0.2 MG tablet Take 0.2 mg by mouth 2 (two) times daily.    [provider]  escitalopram (LEXAPRO) 10 MG tablet Take 10 mg by mouth daily.    [provider]  glucose blood (BAYER CONTOUR TEST) test strip Use as instructed 4 x daily 11/08/15   Cassandria Anger, MD  hydrALAZINE (APRESOLINE) 100 MG tablet Take 100 mg by mouth 3 (three) times daily.    [provider]  HYDROcodone-acetaminophen (NORCO/VICODIN) 5-325 MG tablet Take 1-2 tablets by mouth every 6 (six) hours as needed. 01/09/19   Raynelle Bring,  MD  Insulin Glargine [facility] KWIKPEN) 100 UNIT/ML SOPN Inject 30-50 Units into the skin at bedtime.    [provider]  Insulin Pen Needle (B-D ULTRAFINE III SHORT PEN) 31G X 8 MM MISC 1 each by Does not apply route as directed. 09/02/15   Cassandria Anger, MD  Iron-FA-B Cmp-C-Biot-Probiotic (FUSION PLUS) CAPS Take 1 tablet by mouth daily. 11/20/18   [provider]  Lancets MISC 1 each by Does not apply route 4 (four) times daily. 11/08/15   Cassandria Anger, MD  metoprolol succinate (TOPROL-XL) 25 MG 24 hr tablet Take 25 mg by mouth daily.    [provider]  sitaGLIPtin (JANUVIA) 50 MG tablet Take 50 mg by mouth daily.    [provider]  sodium bicarbonate 650 MG tablet Take 650 mg by mouth 2 (two) times daily.    [provider]    Past Medical History: Past Medical History:  Diagnosis Date  . Diabetes mellitus without complication ([place])   . History of bleeding ulcers   . History of TIAs   . Hypertension   . Low kidney function     Past Surgical History: Past Surgical History:  Procedure Laterality Date  . CATARACT EXTRACTION W/PHACO Left 02/15/2014   Procedure: CATARACT EXTRACTION PHACO AND INTRAOCULAR LENS PLACEMENT (IOC);  Surgeon: Tonny Branch, MD;  Location: [facility];  Service: Ophthalmology;  Laterality: Left;  CDE 10.84  . CATARACT EXTRACTION W/PHACO Right 12/14/2013   Procedure: CATARACT EXTRACTION PHACO AND INTRAOCULAR LENS PLACEMENT (IOC);  Surgeon: Tonny Branch, MD;  Location: [facility];  Service: Ophthalmology;  Laterality: Right;  CDE:  16.30  . CHOLECYSTECTOMY  02/2013  . EYE SURGERY    . IR FLUORO GUIDE CV LINE RIGHT  01/05/2019  . IR US GUIDE VASC ACCESS RIGHT  01/05/2019  . LAPAROSCOPIC NEPHRECTOMY Right 01/08/2019   Procedure: LAPAROSCOPIC RADICAL NEPHRECTOMY;  Surgeon: Raynelle Bring, MD;  Location: [facility];  Service: Urology;  Laterality: Right;  . PENILE PROSTHESIS IMPLANT    . TOE AMPUTATION Right 2013   [facility]    Family History: Family History  Problem Relation Age of Onset  . Stroke Mother   . Stroke Father   . Cancer Father   . Cancer Brother     Social History: Social History   Socioeconomic History  . Marital status: Married    Spouse name: Not on file  . Number of children: Not on file  . Years of education: Not on file  . Highest education level: Not on file  Occupational History  . Not on file  Tobacco Use  . Smoking status: Never Smoker  . Smokeless tobacco: Never Used  Vaping Use  . Vaping Use:  Never used  Substance and Sexual Activity  . Alcohol use: Yes    Comment: social drink   . Drug use: No  . Sexual activity: Not on file  Other Topics Concern  . Not on file  Social History Narrative  . Not on file   Social Determinants of Health   Financial Resource Strain: Not on file  Food Insecurity: Not on file  Transportation Needs: Not on file  Physical Activity: Not on file  Stress: Not on file  Social Connections: Not on file    Allergies:  No Known Allergies  Objective:    Vital Signs:   Temp:  [97.6 F (36.4 C)-98.9 F (37.2 C)] 98.9 F (37.2 C) (06/07 0733) Pulse Rate:  [67-132] 94 (06/07 0615) Resp:  [10-48] 48 (06/07 0615) BP: (56-157)/(18-145) 110/59 (06/07 0530) SpO2:  [76 %-100 %] 94 % (06/07 0615) Weight:  [95.2 kg-95.3 kg] 95.2 kg (06/06 2230)    Weight change: Filed Weights   04/10/21 2145 04/10/21 2230  Weight: 95.3 kg 95.2 kg    Intake/Output:   Intake/Output Summary (Last 24 hours) at 04/11/2021 0835 Last data filed at 04/11/2021 0245 Gross per 24 hour  Intake 350 ml  Output 2246 ml  Net -1896 ml      Physical Exam    General: No resp difficulty HEENT: normal Neck: supple. JVP 8-9  . Carotids 2+ bilat; no bruits. No lymphadenopathy or thyromegaly appreciated. Cor: PMI nondisplaced. Regular rate & rhythm. No rubs, gallops or murmurs. Lungs: clear Abdomen: soft, nontender, nondistended. No hepatosplenomegaly.  No bruits or masses. Good bowel sounds. Extremities: no cyanosis, clubbing, rash, R and LLE 1+ edema.  LUE AVF Neuro: alert & orientedx3, cranial nerves grossly intact. moves all 4 extremities w/o difficulty. Affect flat   Telemetry   SR 70-80s   EKG   SR 73 bpm   Labs   Basic Metabolic Panel: Recent Labs  Lab 04/10/21 1033 04/10/21 2118 04/11/21 0114  NA 136 136 135  K 4.5 4.3 3.0*  CL 98 97* 98  CO2 '23 25 26  ' GLUCOSE 106* 57* 101*  BUN 82* 87* 33*  CREATININE 9.76* 10.25* 4.44*  CALCIUM 8.7* 8.1* 8.2*  MG  --   --  1.7  PHOS  --  8.1* 3.5    Liver Function Tests: Recent Labs  Lab 04/10/21 1725 04/10/21 2118 04/10/21 2131 04/10/21 2224  AST 23  --  29 23  ALT 15  --  14 15  ALKPHOS 86  --  79 85  BILITOT 0.7  --  1.0 1.0  PROT 5.5*  --  5.7* 5.2*  ALBUMIN 2.3* 2.4* 2.3* 2.2*   Recent Labs  Lab 04/10/21 2131  LIPASE 25  AMYLASE 27*   No results for input(s): AMMONIA in the last 168 hours.  CBC: Recent Labs  Lab 04/10/21 1033 04/10/21 2132 04/10/21 2133 04/11/21 0114  WBC 5.9  --  4.4 3.8*  HGB 9.1*  --  8.7* 8.7*  HCT 29.0*  --  28.2* 26.9*  MCV 99.0  --  98.9 96.1  PLT 109* 97* 113* 99*    Cardiac Enzymes: No results for input(s): CKTOTAL, CKMB, CKMBINDEX, TROPONINI in the last 168 hours.  BNP: BNP (last 3 results) Recent Labs    04/10/21 1033  BNP 4,239.3*    ProBNP (last 3 results) No results for input(s): PROBNP in the last 8760 hours.   CBG: Recent Labs  Lab 04/10/21 2235 04/11/21 0312 04/11/21 0650 04/11/21  0830  GLUCAP 87 88 77 83    Coagulation Studies: Recent Labs    04/10/21 2132  LABPROT 15.1  INR 1.2     Imaging   DG Chest 2 View  Result Date: 04/10/2021 CLINICAL DATA:  Shortness of breath EXAM: CHEST - 2 VIEW COMPARISON:  04/13/2020 FINDINGS: Prior CABG. Mild cardiomegaly and vascular congestion. Bibasilar atelectasis. Suspect small effusions. No acute bony abnormality. IMPRESSION: Mild cardiomegaly,  vascular congestion. Bibasilar atelectasis with small effusions. Electronically Signed   By: Rolm Baptise M.D.   On: 04/10/2021 11:00   DG Tibia/Fibula Left  Result Date: 04/10/2021 CLINICAL DATA:  Left lower extremity nonhealing wound EXAM: LEFT TIBIA AND FIBULA - 2 VIEW COMPARISON:  None. FINDINGS: Three view radiograph left knee demonstrates normal alignment. No fracture or dislocation. No osseous erosion. No effusion. Advanced vascular calcifications are seen throughout the left lower extremity. IMPRESSION: Peripheral vascular disease.  No osseous erosion. Electronically Signed   By: Fidela Salisbury MD   On: 04/10/2021 20:03   DG Foot 2 Views Left  Result Date: 04/10/2021 CLINICAL DATA:  Diabetic foot wound EXAM: LEFT FOOT - 2 VIEW COMPARISON:  None. FINDINGS: Two view radiograph left foot demonstrates normal alignment. No fracture or dislocation. No osseous erosion. Advanced vascular calcifications are seen throughout the left foot. Moderate plantar calcaneal spur. Joint spaces are preserved. IMPRESSION: Peripheral vascular disease with advanced vascular calcifications throughout the left foot. No osseous erosion. Electronically Signed   By: Fidela Salisbury MD   On: 04/10/2021 20:02   ECHOCARDIOGRAM COMPLETE  Result Date: 04/10/2021    ECHOCARDIOGRAM REPORT   Patient Name:   JAVONI LUCKEN Date of Exam: 04/10/2021 Medical Rec #:  [id number]       Height:       70.0 in Accession #:    [id number]      Weight:       196.0 lb Date of Birth:  02-10-1956       BSA:          2.069 m Patient Age:    25 years        BP:           111/56 mmHg Patient Gender: M               HR:           67 bpm. Exam Location:  Inpatient Procedure: 2D Echo, Cardiac Doppler, Color Doppler and Intracardiac            Opacification Agent Indications:    H03.88 Acute diastolic (congestive) heart failure  History:        Patient has no prior history of Echocardiogram examinations.                 Prior CABG, Signs/Symptoms:Shortness of  Breath and Confusion;                 Risk Factors:Diabetes. ESRD on HD. LEE.  Sonographer:    Merrie Roof RDCS Referring Phys: [phone] PHILIP J NAHSER IMPRESSIONS  1. LV function appears preserved at the base and diminished moving toward apex, akinetic at the apex. This is across all segments. Differential includes takotsubo's cardiomyopathy vs. multivessel CAD. LV thrombus excluded by contrast. Left ventricular ejection fraction, by estimation, is 30 to 35%. The left ventricle has moderately decreased function. The left ventricle demonstrates regional wall motion abnormalities (see scoring diagram/findings for description). The left ventricular internal cavity size was severely dilated. Left ventricular diastolic parameters  are indeterminate.  2. Right ventricular systolic function is moderately reduced. The right ventricular size is moderately enlarged.  3. Left atrial size was moderately dilated.  4. Right atrial size was mildly dilated.  5. The mitral valve is degenerative. Mild to moderate mitral valve regurgitation. No evidence of mitral stenosis.  6. The aortic valve is tricuspid. There is moderate calcification of the aortic valve. There is moderate thickening of the aortic valve. Aortic valve regurgitation is not visualized. Mild aortic valve stenosis.  7. The inferior vena cava is dilated in size with <50% respiratory variability, suggesting right atrial pressure of 15 mmHg. Comparison(s): No prior Echocardiogram. Conclusion(s)/Recommendation(s): Severely reduced LVEF with worse wall motion at the apex compared to the base. Concerning for takotsubo's vs. multivessel CAD/apical LAD. Findings communicated with Maxwell. Acie Fredrickson. FINDINGS  Left Ventricle: LV function appears preserved at the base and diminished moving toward apex, akinetic at the apex. This is across all segments. Differential includes takotsubo's cardiomyopathy vs. multivessel CAD. LV thrombus excluded by contrast. Left ventricular ejection fraction,  by estimation, is 30 to 35%. The left ventricle has moderately decreased function. The left ventricle demonstrates regional wall motion abnormalities. Definity contrast agent was given IV to delineate the left ventricular endocardial borders. The left ventricular internal cavity size was severely dilated. There is borderline left ventricular hypertrophy. Left ventricular diastolic parameters are indeterminate. Right Ventricle: The right ventricular size is moderately enlarged. Right vetricular wall thickness was not well visualized. Right ventricular systolic function is moderately reduced. Left Atrium: Left atrial size was moderately dilated. Right Atrium: Right atrial size was mildly dilated. Pericardium: There is no evidence of pericardial effusion. Mitral Valve: The mitral valve is degenerative in appearance. There is mild thickening of the mitral valve leaflet(s). There is mild calcification of the mitral valve leaflet(s). Mild to moderate mitral valve regurgitation. No evidence of mitral valve stenosis. Tricuspid Valve: The tricuspid valve is normal in structure. Tricuspid valve regurgitation is trivial. No evidence of tricuspid stenosis. Aortic Valve: The aortic valve is tricuspid. There is moderate calcification of the aortic valve. There is moderate thickening of the aortic valve. Aortic valve regurgitation is not visualized. Mild aortic stenosis is present. Aortic valve mean gradient measures 16.0 mmHg. Aortic valve peak gradient measures 30.0 mmHg. Aortic valve area, by VTI measures 0.96 cm. Pulmonic Valve: The pulmonic valve was not well visualized. Pulmonic valve regurgitation is not visualized. Aorta: The aortic root and ascending aorta are structurally normal, with no evidence of dilitation. Venous: The inferior vena cava is dilated in size with less than 50% respiratory variability, suggesting right atrial pressure of 15 mmHg. IAS/Shunts: The atrial septum is grossly normal.  LEFT VENTRICLE PLAX 2D  LVIDd:         6.30 cm LVIDs:         4.30 cm LV PW:         1.20 cm LV IVS:        0.90 cm LVOT diam:     2.00 cm LV SV:         56 LV SV Index:   27 LVOT Area:     3.14 cm  RIGHT VENTRICLE            IVC RV Basal diam:  4.60 cm    IVC diam: 2.40 cm RV Mid diam:    3.90 cm RV S prime:     6.53 cm/s TAPSE (M-mode): 1.5 cm LEFT ATRIUM  Index       RIGHT ATRIUM           Index LA diam:        4.90 cm 2.37 cm/m  RA Area:     15.00 cm LA Vol (A2C):   90.3 ml 43.64 ml/m RA Volume:   42.80 ml  20.68 ml/m LA Vol (A4C):   87.5 ml 42.28 ml/m LA Biplane Vol: 89.3 ml 43.15 ml/m  AORTIC VALVE AV Area (Vmax):    1.00 cm AV Area (Vmean):   1.07 cm AV Area (VTI):     0.96 cm AV Vmax:           274.00 cm/s AV Vmean:          185.000 cm/s AV VTI:            0.576 m AV Peak Grad:      30.0 mmHg AV Mean Grad:      16.0 mmHg LVOT Vmax:         87.20 cm/s LVOT Vmean:        63.200 cm/s LVOT VTI:          0.177 m LVOT/AV VTI ratio: 0.31  AORTA Ao Root diam: 2.90 cm MITRAL VALVE MV Area (PHT): 5.13 cm     SHUNTS MV Decel Time: 148 msec     Systemic VTI:  0.18 m MV E velocity: 127.00 cm/s  Systemic Diam: 2.00 cm MV A velocity: 43.20 cm/s MV E/A ratio:  2.94 Buford Dresser MD Electronically signed by Buford Dresser MD Signature Date/Time: 04/10/2021/4:17:26 PM    Final       Medications:     Current Medications: . atorvastatin  80 mg Oral Daily  . Chlorhexidine Gluconate Cloth  6 each Topical Q0600  . heparin  5,000 Units Subcutaneous Q8H  . insulin aspart  0-6 Units Subcutaneous TID WC  . pantoprazole (PROTONIX) IV  40 mg Intravenous QHS  . vancomycin variable dose per unstable renal function (pharmacist dosing)   Does not apply See admin instructions     Infusions: . sodium chloride    . sodium chloride    . sodium chloride    . norepinephrine (LEVOPHED) Adult infusion    . piperacillin-tazobactam (ZOSYN)  IV 2.25 g (04/11/21 0702)      Assessment/Plan   1. AMS ?[place]ic.  Shock?  - WBC was not elevated. Lactic Acid 1.  - Blood CX pending - On vanc/zosyn  2. ESRD -Has been seen at [facility] for possible transplant.  -Previously on PD but ineffective drain last week. Switched iHD - Had HD last night -Nephrology consulted   3. CAD  -H/O CABG 2021  - No chest pain.  - HS Trop 1740>8144. - Eventually with need LHC. Timing per Maxwell Haroldine Laws.   4. Acute Systolic Heart Failure  - Stress ECHO 02/2021 EF ~55%.  - ECHO this admit EF 30-35%  - Fluid management via dialysis - HS Trop 8185>[id number]. Would benefit from cath.  - No bb for now.  - No spiro /arni/dig with ESRD   5.Charcot right Maxwell Sharol Given following. On vancomycin/zosyn.   6. PAD  -LLE concerning for ischemia.  -04/10/21 Vascular consulted. Plan for  Arteriogram to assess flow.   7. Anemia  8. DMII On SSI  Length of Stay: [place], NP  04/11/2021, 8:35 AM  Advanced Heart Failure Team Pager [phone] (M-F; 7a - 5p)  Please contact [facility] for night-coverage after hours (4p -7a )  and weekends on [email or web address]  Patient seen and examined with the above-signed Advanced Practice Provider and/or Housestaff. I personally reviewed laboratory data, imaging studies and relevant notes. I independently examined the patient and formulated the important aspects of the plan. I have edited the note to reflect any of my changes or salient points. I have personally discussed the plan with the patient and/or family.  Very complicated situation. 65 y/o male with multiple medical problems including DM2, ESRD, charcot joint with active wound, CAD with CABG in 2021 admitted with altered mental status. Creatinine on admit 10. Switched from PD to HD. Found to have LLE wound with concern PAD/critical limb ischemia. Hs trop 1300   Echo done EF 30-35% (previous EF was 50%) pattern on echo suggestive of possible Tako-Tsubo.   General:  Weak appearing. No resp difficulty HEENT: normal Neck: supple. jvp to jaw Carotids 2+  bilat; no bruits. No lymphadenopathy or thryomegaly appreciated. Cor: PMI nondisplaced. Regular rate & rhythm. No rubs, gallops or murmurs. Lungs: clear Abdomen: soft, nontender, nondistended. No hepatosplenomegaly. No bruits or masses. Good bowel sounds. Extremities: no cyanosis, clubbing, rash, 2 + edema LLE wound + LUE fistula  Neuro: alert & orientedx3, cranial nerves grossly intact. moves all 4 extremities w/o difficulty. Affect pleasant   Multiple active issues. From our standpoint, the question remains why is his EF dropping. Echo raises the possibility of NICM on top of possible previous iCM. However, hs trop is elevated. (Did not have ischemic symptoms prior to CABG - CAD found on routine stress test as part of transplant w/u). Will need coronary/graft angio once other issues sorted out. Volume status managed with HD - still has some volume on board.. Plan cath at end of week likely.   Glori Bickers, MD  2:08 PM

## 2021-04-12 LAB — CBC
HCT: 30.6 % — ABNORMAL LOW (ref 39.0–52.0)
Hemoglobin: 9.3 g/dL — ABNORMAL LOW (ref 13.0–17.0)
MCH: 30.4 pg (ref 26.0–34.0)
MCHC: 30.4 g/dL (ref 30.0–36.0)
MCV: 100 fL (ref 80.0–100.0)
Platelets: 109 10*3/uL — ABNORMAL LOW (ref 150–400)
RBC: 3.06 MIL/uL — ABNORMAL LOW (ref 4.22–5.81)
RDW: 15.3 % (ref 11.5–15.5)
WBC: 4.1 10*3/uL (ref 4.0–10.5)
nRBC: 0 % (ref 0.0–0.2)

## 2021-04-12 LAB — RENAL FUNCTION PANEL
Albumin: 2.4 g/dL — ABNORMAL LOW (ref 3.5–5.0)
Anion gap: 12 (ref 5–15)
BUN: 47 mg/dL — ABNORMAL HIGH (ref 8–23)
CO2: 25 mmol/L (ref 22–32)
Calcium: 8.2 mg/dL — ABNORMAL LOW (ref 8.9–10.3)
Chloride: 95 mmol/L — ABNORMAL LOW (ref 98–111)
Creatinine, Ser: 6.22 mg/dL — ABNORMAL HIGH (ref 0.61–1.24)
GFR, Estimated: 9 mL/min — ABNORMAL LOW (ref >60)
Glucose, Bld: 336 mg/dL — ABNORMAL HIGH (ref 70–99)
Phosphorus: 6.1 mg/dL — ABNORMAL HIGH (ref 2.5–4.6)
Potassium: 4.3 mmol/L (ref 3.5–5.1)
Sodium: 132 mmol/L — ABNORMAL LOW (ref 135–145)

## 2021-04-12 LAB — GLUCOSE, CAPILLARY
Glucose-Capillary: 306 mg/dL — ABNORMAL HIGH (ref 70–99)
Glucose-Capillary: 161 mg/dL — ABNORMAL HIGH (ref 70–99)
Glucose-Capillary: 109 mg/dL — ABNORMAL HIGH (ref 70–99)
Glucose-Capillary: 222 mg/dL — ABNORMAL HIGH (ref 70–99)

## 2021-04-12 LAB — VANCOMYCIN, RANDOM: Vancomycin Rm: 29

## 2021-04-12 LAB — MAGNESIUM: Magnesium: 2.1 mg/dL (ref 1.7–2.4)

## 2021-04-12 MED ORDER — PANTOPRAZOLE SODIUM 40 MG PO TBEC
40.0000 mg | DELAYED_RELEASE_TABLET | Freq: Every day | ORAL | Status: DC
  Administered 2021-04-13 – 2021-04-19 (×7): 40 mg via ORAL
  Filled 2021-04-12 (×8): qty 1

## 2021-04-12 MED ORDER — VANCOMYCIN HCL 750 MG/150ML IV SOLN
750.0000 mg | INTRAVENOUS | Status: AC
  Administered 2021-04-12: 750 mg via INTRAVENOUS
  Filled 2021-04-12: qty 150

## 2021-04-12 MED ORDER — PENTAFLUOROPROP-TETRAFLUOROETH EX AERO
INHALATION_SPRAY | CUTANEOUS | Status: AC
  Filled 2021-04-12: qty 116

## 2021-04-12 MED ORDER — INSULIN GLARGINE 100 UNIT/ML ~~LOC~~ SOLN
15.0000 [IU] | Freq: Every day | SUBCUTANEOUS | Status: DC
  Administered 2021-04-12 – 2021-04-14 (×3): 15 [IU] via SUBCUTANEOUS
  Filled 2021-04-12 (×4): qty 0.15

## 2021-04-12 MED ORDER — HEPARIN 1000 UNIT/ML FOR PERITONEAL DIALYSIS: INTRAPERITONEAL | Status: DC | PRN

## 2021-04-12 NOTE — Plan of Care (Signed)
  Problem: Skin Integrity: Goal: Risk for impaired skin integrity will decrease Outcome: Not Progressing   Problem: Activity: Goal: Capacity to carry out activities will improve Outcome: Not Progressing

## 2021-04-12 NOTE — Progress Notes (Addendum)
Advanced Heart Failure Rounding Note  PCP-Cardiologist: None   Subjective:    Feels ok today. No severe foot pain. No dyspnea or CP.   Remains on vanc + zosyn. BCx NGTD AF. WBC nl.   Going for iHD again today.    Objective:   Weight Range: 92.2 kg Body mass index is 28.35 kg/m.   Vital Signs:   Temp:  [97.8 F (36.6 C)-99.2 F (37.3 C)] 98.8 F (37.1 C) (06/08 0743) Pulse Rate:  [78-91] 78 (06/08 0908) Resp:  [12-25] 18 (06/08 0908) BP: (92-142)/(31-90) 110/68 (06/08 0908) SpO2:  [91 %-100 %] 96 % (06/08 0908) Weight:  [92.2 kg-92.4 kg] 92.2 kg (06/08 0720) Last BM Date: 04/11/21  Weight change: Filed Weights   04/11/21 1856 04/11/21 1900 04/12/21 0720  Weight: 92.3 kg 92.4 kg 92.2 kg    Intake/Output:   Intake/Output Summary (Last 24 hours) at 04/12/2021 1100 Last data filed at 04/12/2021 0809 Gross per 24 hour  Intake 596.93 ml  Output 50 ml  Net 546.93 ml      Physical Exam    General:  Well appearing. No resp difficulty HEENT: Normal Neck: Supple. JVP 10 cm . Carotids 2+ bilat; no bruits. No lymphadenopathy or thyromegaly appreciated. Cor: PMI nondisplaced. Regular rate & rhythm. 3/6 murmur  Lungs: Clear Abdomen: Soft, nontender, nondistended. No hepatosplenomegaly. No bruits or masses. Good bowel sounds. Extremities: No cyanosis, clubbing, rash, 1+ bilateral LE edema Neuro: Alert & orientedx3, cranial nerves grossly intact. moves all 4 extremities w/o difficulty. Affect pleasant   Telemetry   NSR 80s   EKG    Not performed   Labs    CBC Recent Labs    04/11/21 0114 04/12/21 0224  WBC 3.8* 4.1  HGB 8.7* 9.3*  HCT 26.9* 30.6*  MCV 96.1 100.0  PLT 99* 130*   Basic Metabolic Panel Recent Labs    04/11/21 0114 04/12/21 0224  NA 135 132*  K 3.0* 4.3  CL 98 95*  CO2 26 25  GLUCOSE 101* 336*  BUN 33* 47*  CREATININE 4.44* 6.22*  CALCIUM 8.2* 8.2*  MG 1.7 2.1  PHOS 3.5 6.1*   Liver Function Tests Recent Labs     04/10/21 2131 04/10/21 2224 04/12/21 0224  AST 29 23  --   ALT 14 15  --   ALKPHOS 79 85  --   BILITOT 1.0 1.0  --   PROT 5.7* 5.2*  --   ALBUMIN 2.3* 2.2* 2.4*   Recent Labs    04/10/21 2131  LIPASE 25  AMYLASE 27*   Cardiac Enzymes No results for input(s): CKTOTAL, CKMB, CKMBINDEX, TROPONINI in the last 72 hours.  BNP: BNP (last 3 results) Recent Labs    04/10/21 1033  BNP 4,239.3*    ProBNP (last 3 results) No results for input(s): PROBNP in the last 8760 hours.   D-Dimer Recent Labs    04/10/21 2132  DDIMER 2.86*   Hemoglobin A1C Recent Labs    04/10/21 1640  HGBA1C 8.2*   Fasting Lipid Panel Recent Labs    04/10/21 1640  CHOL 122  HDL 32*  LDLCALC 74  TRIG 81  CHOLHDL 3.8   Thyroid Function Tests No results for input(s): TSH, T4TOTAL, T3FREE, THYROIDAB in the last 72 hours.  Invalid input(s): FREET3  Other results:   Imaging     No results found.   Medications:     Scheduled Medications: . atorvastatin  80 mg Oral Daily  . Chlorhexidine  Gluconate Cloth  6 each Topical Q0600  . escitalopram  10 mg Oral Daily  . gentamicin cream  1 application Topical Daily  . dianeal solution for CAPD/CCPD with heparin   Peritoneal Dialysis Q24H  . heparin  5,000 Units Subcutaneous Q8H  . insulin aspart  0-6 Units Subcutaneous TID WC  . insulin glargine  15 Units Subcutaneous QHS  . pantoprazole  40 mg Oral Q supper  . vancomycin variable dose per unstable renal function (pharmacist dosing)   Does not apply See admin instructions     Infusions: . sodium chloride    . sodium chloride    . sodium chloride 250 mL (04/11/21 1248)  . piperacillin-tazobactam (ZOSYN)  IV 2.25 g (04/12/21 0631)     PRN Medications:  sodium chloride, sodium chloride, alteplase, clonazepam, docusate sodium, heparin, heparin, lidocaine (PF), lidocaine-prilocaine, ondansetron (ZOFRAN) IV, pentafluoroprop-tetrafluoroeth, polyethylene glycol    Patient Profile    Very complicated situation. 65 y/o male with multiple medical problems including DM2, ESRD, charcot joint with active wound, CAD with CABG in 2021 admitted with altered mental status. Creatinine on admit 10. Switched from PD to HD. Found to have LLE wound with concern PAD/critical limb ischemia. Hs trop 1300   Echo done EF 30-35% (previous EF was 50%) pattern on echo suggestive of possible T[place]-T[place]o.   Assessment/Plan   1. AMS  ?Uremic. Shock?  - WBC was not elevated. Lactic Acid 1.  - Blood CX NGTD  - On vanc/zosyn  2. ESRD -Has been seen at [facility] for possible transplant.  -Previously on PD but ineffective drain last week. Switched iHD - Nephrology following   3. CAD  - H/O CABG 2021  - No chest pain.  - HS Trop [id number]. - Eventually with need LHC. Timing per Dr Haroldine Laws.   4. Acute Systolic Heart Failure  - Stress ECHO 02/2021 EF ~55%.  - ECHO this admit EF 30-35%  - Fluid management via dialysis - HS Trop [id number]. Will need LHC once other issues sorted out - No bb for now.  - No spiro /arni/dig with ESRD   5.Charcot right - Dr Sharol Given following. On vancomycin/zosyn.   6. PAD  -LLE concerning for ischemia.  -04/10/21 Vascular consulted. Plan for  Arteriogram to assess flow.   7. Anemia - Hgb stable 9.3   8. DMII - On SSI   Length of Stay: 2  Lyda Jester, PA-C  04/12/2021, 11:00 AM  Advanced Heart Failure Team Pager [phone] (M-F; 7a - 5p)  Please contact [facility] for night-coverage after hours (5p -7a ) and weekends on [email or web address]  Patient seen and examined with the above-signed Advanced Practice Provider and/or Housestaff. I personally reviewed laboratory data, imaging studies and relevant notes. I independently examined the patient and formulated the important aspects of the plan. I have edited the note to reflect any of my changes or salient points. I have personally discussed the plan with the patient and/or family.  Inadequate UF  with PD yesterday. Plan for HD today. No CP or SOB.   General:  Sitting up in bed. No resp difficulty HEENT: normal Neck: supple. JVP 10  Carotids 2+ bilat; no bruits. No lymphadenopathy or thryomegaly appreciated. Cor: PMI nondisplaced. Regular rate & rhythm. No rubs, gallops or murmurs. Lungs: clear Abdomen: soft, nontender, nondistended. No hepatosplenomegaly. No bruits or masses. Good bowel sounds. Extremities: no cyanosis, clubbing, rash, 1+ edema LUE AVF Neuro: alert & orientedx3, cranial nerves grossly intact. moves all 4 extremities w/o difficulty. Affect  pleasant  Plan for HD today to address volume status. Will plan coronary/graft angio on Friday to assess for early graft failure as cause of dropping EF.  Glori Bickers, MD  11:21 AM

## 2021-04-12 NOTE — Plan of Care (Signed)
  Problem: Skin Integrity: Goal: Risk for impaired skin integrity will decrease Outcome: Not Progressing

## 2021-04-12 NOTE — Progress Notes (Signed)
Pharmacy Antibiotic Note  Maxwell Harmon is a 65 y.o. male admitted on 04/10/2021 with sepsis.  Pharmacy has been consulted for Zosyn and vancomycin dosing.  ESRD on peritoneal dialysis at home but recently underwent HD given problems with peritoneal dialysis at home. No UF on PD last night - plan is for HD today.  Random vancomycin level is 29 mcg/ml and just above therapeutic range of 15-25 mcg/ml.  Plan: -No vancomycin supplementation needed right now -F/u after dialysis today to determine if needs a dose -Monitor CBC, renal fx, cultures and clinical progress  Height: 5\' 11"  (180.3 cm) Weight: 92.2 kg (203 lb 4.2 oz) IBW/kg (Calculated) : 75.3  Temp (24hrs), Avg:98.3 F (36.8 C), Min:97.8 F (36.6 C), Max:99.2 F (37.3 C)  Recent Labs  Lab 04/10/21 1033 04/10/21 1819 04/10/21 2118 04/10/21 2133 04/10/21 2224 04/11/21 0114 04/12/21 0224 04/12/21 1206  WBC 5.9  --   --  4.4  --  3.8* 4.1  --   CREATININE 9.76*  --  10.25*  --   --  4.44* 6.22*  --   LATICACIDVEN  --  1.0  --   --  0.9  --   --   --   VANCORANDOM  --   --   --   --   --   --   --  29    Estimated Creatinine Clearance: 13.9 mL/min (A) (by C-G formula based on SCr of 6.22 mg/dL (H)).    No Known Allergies  Antimicrobials this admission: Zosyn 6/6 >>  Vancomycin 6/6 >>   Dose adjustments this admission: 6/8 VR 29  Microbiology results: 6/6 BCx: ngtd  Thank you for involving pharmacy in this patient's care.  Renold Genta, PharmD, BCPS Clinical Pharmacist Clinical phone for 04/12/2021 until 3p is [phone] 04/12/2021 1:26 PM  **Pharmacist phone directory can be found on [email or web address] listed under [place]**

## 2021-04-12 NOTE — TOC Initial Note (Signed)
Transition of Care (TOC) - Initial/Assessment Note  Heart Failure   Patient Details  Name: Maxwell Harmon MRN: [id number] Date of Birth: Dec 13, 1955  Transition of [facility]) CM/SW Contact:    [place], Myrtle Phone Number: 04/12/2021, 11:42 AM  Clinical Narrative:                 CSW spoke with the patient at bedside and completed a very brief SDOH screening with the patient who denied having any needs at this time. Patient reported they do have a PCP and they can get to the pharmacy to pick up her medications. CSW provided the patient with social workers name and position if they identify other needs to please reach out so that CSW can provide support.  CSW will continue to follow throughout discharge.   Barriers to Discharge: Continued Medical Work up   Patient Goals and CMS Choice        Expected Discharge Plan and Services   In-house Referral: Clinical Social Work     Living arrangements for the past 2 months: Single Family Home                                      Prior Living Arrangements/Services Living arrangements for the past 2 months: Single Family Home Lives with:: Self,Spouse Patient language and need for interpreter reviewed:: Yes        Need for Family Participation in Patient Care: No (Comment) Care giver support system in place?: No (comment)   Criminal Activity/Legal Involvement Pertinent to Current Situation/Hospitalization: No - Comment as needed  Activities of Daily Living      Permission Sought/Granted                  Emotional Assessment Appearance:: Appears stated age Attitude/Demeanor/Rapport: Engaged Affect (typically observed): Pleasant Orientation: : Oriented to Self,Oriented to Place,Oriented to  Time,Oriented to Situation   Psych Involvement: No (comment)  Admission diagnosis:  Edema [R60.9] Acute heart failure ([place]) [I50.9] Uremia [N19] Dyspnea on exertion [R06.00] Elevated troponin [R77.8] ESRD on  hemodialysis ([place]) [N18.6, Z99.2] Patient Active Problem List   Diagnosis Date Noted  . Pressure injury of skin 04/11/2021  . Acute heart failure ([place]) 04/10/2021  . Uremia 04/10/2021  . Gangrene of left foot ([place])   . PVD (peripheral vascular disease) ([place])   . ESRD on hemodialysis ([place])   . Dyspnea on exertion   . 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 Diabetes mellitus with stage 4 chronic kidney disease ([place]) 09/02/2015  . Hyperlipidemia 09/02/2015  . Essential hypertension, benign 09/02/2015  . Obesity due to excess calories 09/02/2015   PCP:  [name], DO Pharmacy:   [id number] - [place], [place] [address] Phone: [phone] Fax: [phone]     Social Determinants of Health (SDOH) Interventions Food Insecurity Interventions: Intervention Not Indicated Financial Strain Interventions: Intervention Not Indicated Housing Interventions: Intervention Not Indicated Transportation Interventions: Intervention Not Indicated  Readmission Risk Interventions No flowsheet data found.  Kelvin Sennett, MSW, [place] Heart Failure Social Worker

## 2021-04-12 NOTE — Progress Notes (Signed)
Patient ID: Maxwell Harmon, male   DOB: 06-29-1956, 65 y.o.   MRN: [id number] S: Feels better today.  Reports that he did have some issues with drainage on the first exchange but fell asleep on the others.  No net UF with CCPD last night. O:BP 110/68 (BP Location: Right Arm)   Pulse 78   Temp 98.8 F (37.1 C) (Oral)   Resp 18   Ht 5\' 11"  (1.803 m)   Wt 92.2 kg   SpO2 96%   BMI 28.35 kg/m   Intake/Output Summary (Last 24 hours) at 04/12/2021 1100 Last data filed at 04/12/2021 0809 Gross per 24 hour  Intake 596.93 ml  Output 50 ml  Net 546.93 ml   Intake/Output: I/O last 3 completed shifts: In: 706.9 [P.O.:120; I.V.:10; IV Piggyback:576.9] Out: 2296 [Urine:50; Other:2246]  Intake/Output this shift:  Total I/O In: 240 [P.O.:240] Out: -  Weight change: -2.948 kg Gen: NAD CVS: RRR Resp: CTA with decreased BS at bases Abd:+BS, soft, NT/Nd, PD catheter without drainage or erythema Ext: 1+ pitting edema bilateral lower extremities, LUE AVF +T/B  Recent Labs  Lab 04/10/21 1033 04/10/21 1725 04/10/21 2118 04/10/21 2131 04/10/21 2224 04/11/21 0114 04/12/21 0224  NA 136  --  136  --   --  135 132*  K 4.5  --  4.3  --   --  3.0* 4.3  CL 98  --  97*  --   --  98 95*  CO2 23  --  25  --   --  26 25  GLUCOSE 106*  --  57*  --   --  101* 336*  BUN 82*  --  87*  --   --  33* 47*  CREATININE 9.76*  --  10.25*  --   --  4.44* 6.22*  ALBUMIN  --  2.3* 2.4* 2.3* 2.2*  --  2.4*  CALCIUM 8.7*  --  8.1*  --   --  8.2* 8.2*  PHOS  --   --  8.1*  --   --  3.5 6.1*  AST  --  23  --  29 23  --   --   ALT  --  15  --  14 15  --   --    Liver Function Tests: Recent Labs  Lab 04/10/21 1725 04/10/21 2118 04/10/21 2131 04/10/21 2224 04/12/21 0224  AST 23  --  29 23  --   ALT 15  --  14 15  --   ALKPHOS 86  --  79 85  --   BILITOT 0.7  --  1.0 1.0  --   PROT 5.5*  --  5.7* 5.2*  --   ALBUMIN 2.3*   < > 2.3* 2.2* 2.4*   < > = values in this interval not displayed.   Recent Labs  Lab  04/10/21 2131  LIPASE 25  AMYLASE 27*   No results for input(s): AMMONIA in the last 168 hours. CBC: Recent Labs  Lab 04/10/21 1033 04/10/21 2132 04/10/21 2133 04/11/21 0114 04/12/21 0224  WBC 5.9  --  4.4 3.8* 4.1  HGB 9.1*  --  8.7* 8.7* 9.3*  HCT 29.0*  --  28.2* 26.9* 30.6*  MCV 99.0  --  98.9 96.1 100.0  PLT 109*   < > 113* 99* 109*   < > = values in this interval not displayed.   Cardiac Enzymes: No results for input(s): CKTOTAL, CKMB, CKMBINDEX, TROPONINI in the last  168 hours. CBG: Recent Labs  Lab 04/11/21 0830 04/11/21 1144 04/11/21 1623 04/11/21 2223 04/12/21 0604  GLUCAP 83 88 102* 270* 306*    Iron Studies: No results for input(s): IRON, TIBC, TRANSFERRIN, FERRITIN in the last 72 hours. Studies/Results: DG Tibia/Fibula Left  Result Date: 04/10/2021 CLINICAL DATA:  Left lower extremity nonhealing wound EXAM: LEFT TIBIA AND FIBULA - 2 VIEW COMPARISON:  None. FINDINGS: Three view radiograph left knee demonstrates normal alignment. No fracture or dislocation. No osseous erosion. No effusion. Advanced vascular calcifications are seen throughout the left lower extremity. IMPRESSION: Peripheral vascular disease.  No osseous erosion. Electronically Signed   By: Fidela Salisbury MD   On: 04/10/2021 20:03   DG Foot 2 Views Left  Result Date: 04/10/2021 CLINICAL DATA:  Diabetic foot wound EXAM: LEFT FOOT - 2 VIEW COMPARISON:  None. FINDINGS: Two view radiograph left foot demonstrates normal alignment. No fracture or dislocation. No osseous erosion. Advanced vascular calcifications are seen throughout the left foot. Moderate plantar calcaneal spur. Joint spaces are preserved. IMPRESSION: Peripheral vascular disease with advanced vascular calcifications throughout the left foot. No osseous erosion. Electronically Signed   By: Fidela Salisbury MD   On: 04/10/2021 20:02   VAS [place] ABI WITH/WO TBI  Result Date: 04/11/2021  LOWER EXTREMITY DOPPLER STUDY Patient Name:  Maxwell Harmon   Date of Exam:   04/11/2021 Medical Rec #: [id number]        Accession #:    [id number] Date of Birth: Aug 10, 1956        Patient Gender: M Patient Age:   65Y Exam Location:  [facility] Procedure:      VAS [place] ABI WITH/WO TBI Referring Phys: [id number] Yevonne Aline HAWKEN --------------------------------------------------------------------------------  Indications: Ulceration, and peripheral artery disease. High Risk Factors: Hypertension, Diabetes, coronary artery disease s/p CABG.  Comparison Study: No prior studies. Performing Technologist: [name] RDMS RVT  Examination Guidelines: A complete evaluation includes at minimum, Doppler waveform signals and systolic blood pressure reading at the level of bilateral brachial, anterior tibial, and posterior tibial arteries, when vessel segments are accessible. Bilateral testing is considered an integral part of a complete examination. Photoelectric Plethysmograph (PPG) waveforms and toe systolic pressure readings are included as required and additional duplex testing as needed. Limited examinations for reoccurring indications may be performed as noted.  ABI Findings: +---------+------------------+-----+-------------------+-----------------------+ Right    Rt Pressure (mmHg)IndexWaveform           Comment                 +---------+------------------+-----+-------------------+-----------------------+ Brachial 117                    triphasic          Unable to obtain due to                                                    IV, pressure obtained                                                      from RT forearm         +---------+------------------+-----+-------------------+-----------------------+ PTA  255               2.18 monophasic                                 +---------+------------------+-----+-------------------+-----------------------+ DP       61                0.52 dampened monophasic                         +---------+------------------+-----+-------------------+-----------------------+ Great Toe29                0.25 Abnormal                                   +---------+------------------+-----+-------------------+-----------------------+ +---------+------------------+-----+-------------------+-------+ Left     Lt Pressure (mmHg)IndexWaveform           Comment +---------+------------------+-----+-------------------+-------+ Brachial                                           AVF     +---------+------------------+-----+-------------------+-------+ PTA      255               2.18 dampened monophasic        +---------+------------------+-----+-------------------+-------+ DP       255               2.18 dampened monophasic        +---------+------------------+-----+-------------------+-------+ Great Toe0                 0.00 Absent                     +---------+------------------+-----+-------------------+-------+ +-------+-----------+-----------+------------+------------+ ABI/TBIToday's ABIToday's TBIPrevious ABIPrevious TBI +-------+-----------+-----------+------------+------------+ Right  [place]         29                                  +-------+-----------+-----------+------------+------------+ Left   [place]         0                                   +-------+-----------+-----------+------------+------------+ Arterial wall calcification precludes accurate ankle pressures and ABIs.  Summary: Right: Resting right ankle-brachial index indicates noncompressible right lower extremity arteries. The right toe-brachial index is abnormal. ABIs are unreliable. Left: Resting left ankle-brachial index indicates noncompressible left lower extremity arteries. The left toe-brachial index is abnormal. ABIs are unreliable.  *See table(s) above for measurements and observations.  Electronically signed by Ruta Hinds MD on 04/11/2021 at 4:23:15 PM.    Final    ECHOCARDIOGRAM  COMPLETE  Result Date: 04/10/2021    ECHOCARDIOGRAM REPORT   Patient Name:   KIERAN ARREGUIN Date of Exam: 04/10/2021 Medical Rec #:  [id number]       Height:       70.0 in Accession #:    [id number]      Weight:       196.0 lb Date of Birth:  Apr 29, 1956       BSA:          2.069 m Patient Age:  65 years        BP:           111/56 mmHg Patient Gender: M               HR:           67 bpm. Exam Location:  Inpatient Procedure: 2D Echo, Cardiac Doppler, Color Doppler and Intracardiac            Opacification Agent Indications:    N82.95 Acute diastolic (congestive) heart failure  History:        Patient has no prior history of Echocardiogram examinations.                 Prior CABG, Signs/Symptoms:Shortness of Breath and Confusion;                 Risk Factors:Diabetes. ESRD on HD. LEE.  Sonographer:    Merrie Roof RDCS Referring Phys: [phone] PHILIP J NAHSER IMPRESSIONS  1. LV function appears preserved at the base and diminished moving toward apex, akinetic at the apex. This is across all segments. Differential includes takotsubo's cardiomyopathy vs. multivessel CAD. LV thrombus excluded by contrast. Left ventricular ejection fraction, by estimation, is 30 to 35%. The left ventricle has moderately decreased function. The left ventricle demonstrates regional wall motion abnormalities (see scoring diagram/findings for description). The left ventricular internal cavity size was severely dilated. Left ventricular diastolic parameters are indeterminate.  2. Right ventricular systolic function is moderately reduced. The right ventricular size is moderately enlarged.  3. Left atrial size was moderately dilated.  4. Right atrial size was mildly dilated.  5. The mitral valve is degenerative. Mild to moderate mitral valve regurgitation. No evidence of mitral stenosis.  6. The aortic valve is tricuspid. There is moderate calcification of the aortic valve. There is moderate thickening of the aortic valve. Aortic valve  regurgitation is not visualized. Mild aortic valve stenosis.  7. The inferior vena cava is dilated in size with <50% respiratory variability, suggesting right atrial pressure of 15 mmHg. Comparison(s): No prior Echocardiogram. Conclusion(s)/Recommendation(s): Severely reduced LVEF with worse wall motion at the apex compared to the base. Concerning for takotsubo's vs. multivessel CAD/apical LAD. Findings communicated with Dr. Acie Fredrickson. FINDINGS  Left Ventricle: LV function appears preserved at the base and diminished moving toward apex, akinetic at the apex. This is across all segments. Differential includes takotsubo's cardiomyopathy vs. multivessel CAD. LV thrombus excluded by contrast. Left ventricular ejection fraction, by estimation, is 30 to 35%. The left ventricle has moderately decreased function. The left ventricle demonstrates regional wall motion abnormalities. Definity contrast agent was given IV to delineate the left ventricular endocardial borders. The left ventricular internal cavity size was severely dilated. There is borderline left ventricular hypertrophy. Left ventricular diastolic parameters are indeterminate. Right Ventricle: The right ventricular size is moderately enlarged. Right vetricular wall thickness was not well visualized. Right ventricular systolic function is moderately reduced. Left Atrium: Left atrial size was moderately dilated. Right Atrium: Right atrial size was mildly dilated. Pericardium: There is no evidence of pericardial effusion. Mitral Valve: The mitral valve is degenerative in appearance. There is mild thickening of the mitral valve leaflet(s). There is mild calcification of the mitral valve leaflet(s). Mild to moderate mitral valve regurgitation. No evidence of mitral valve stenosis. Tricuspid Valve: The tricuspid valve is normal in structure. Tricuspid valve regurgitation is trivial. No evidence of tricuspid stenosis. Aortic Valve: The aortic valve is tricuspid. There is  moderate calcification of the aortic valve. There  is moderate thickening of the aortic valve. Aortic valve regurgitation is not visualized. Mild aortic stenosis is present. Aortic valve mean gradient measures 16.0 mmHg. Aortic valve peak gradient measures 30.0 mmHg. Aortic valve area, by VTI measures 0.96 cm. Pulmonic Valve: The pulmonic valve was not well visualized. Pulmonic valve regurgitation is not visualized. Aorta: The aortic root and ascending aorta are structurally normal, with no evidence of dilitation. Venous: The inferior vena cava is dilated in size with less than 50% respiratory variability, suggesting right atrial pressure of 15 mmHg. IAS/Shunts: The atrial septum is grossly normal.  LEFT VENTRICLE PLAX 2D LVIDd:         6.30 cm LVIDs:         4.30 cm LV PW:         1.20 cm LV IVS:        0.90 cm LVOT diam:     2.00 cm LV SV:         56 LV SV Index:   27 LVOT Area:     3.14 cm  RIGHT VENTRICLE            IVC RV Basal diam:  4.60 cm    IVC diam: 2.40 cm RV Mid diam:    3.90 cm RV S prime:     6.53 cm/s TAPSE (M-mode): 1.5 cm LEFT ATRIUM             Index       RIGHT ATRIUM           Index LA diam:        4.90 cm 2.37 cm/m  RA Area:     15.00 cm LA Vol (A2C):   90.3 ml 43.64 ml/m RA Volume:   42.80 ml  20.68 ml/m LA Vol (A4C):   87.5 ml 42.28 ml/m LA Biplane Vol: 89.3 ml 43.15 ml/m  AORTIC VALVE AV Area (Vmax):    1.00 cm AV Area (Vmean):   1.07 cm AV Area (VTI):     0.96 cm AV Vmax:           274.00 cm/s AV Vmean:          185.000 cm/s AV VTI:            0.576 m AV Peak Grad:      30.0 mmHg AV Mean Grad:      16.0 mmHg LVOT Vmax:         87.20 cm/s LVOT Vmean:        63.200 cm/s LVOT VTI:          0.177 m LVOT/AV VTI ratio: 0.31  AORTA Ao Root diam: 2.90 cm MITRAL VALVE MV Area (PHT): 5.13 cm     SHUNTS MV Decel Time: 148 msec     Systemic VTI:  0.18 m MV E velocity: 127.00 cm/s  Systemic Diam: 2.00 cm MV A velocity: 43.20 cm/s MV E/A ratio:  2.94 Buford Dresser MD Electronically  signed by Buford Dresser MD Signature Date/Time: 04/10/2021/4:17:26 PM    Final    VAS [place] LOWER EXTREMITY VENOUS (DVT)  Result Date: 04/11/2021  Lower Venous DVT Study Patient Name:  WANG GRANADA  Date of Exam:   04/11/2021 Medical Rec #: [id number]        Accession #:    [id number] Date of Birth: 1956-02-22        Patient Gender: M Patient Age:   65Y Exam Location:  [facility] Procedure:  VAS [place] LOWER EXTREMITY VENOUS (DVT) Referring Phys: [id number] PHILIP J NAHSER --------------------------------------------------------------------------------  Indications: Swelling, and CHF.  Limitations: Heavy overlying arterial calcification bilaterally. Comparison Study: No prior studies. Performing Technologist: [name] RDMS,RVT  Examination Guidelines: A complete evaluation includes B-mode imaging, spectral Doppler, color Doppler, and power Doppler as needed of all accessible portions of each vessel. Bilateral testing is considered an integral part of a complete examination. Limited examinations for reoccurring indications may be performed as noted. The reflux portion of the exam is performed with the patient in reverse Trendelenburg.  +---------+---------------+---------+-----------+----------+--------------+ RIGHT    CompressibilityPhasicitySpontaneityPropertiesThrombus Aging +---------+---------------+---------+-----------+----------+--------------+ CFV      Full           Yes      Yes                                 +---------+---------------+---------+-----------+----------+--------------+ SFJ      Full                                                        +---------+---------------+---------+-----------+----------+--------------+ FV Prox  Full                                                        +---------+---------------+---------+-----------+----------+--------------+ FV Mid   Full                                                         +---------+---------------+---------+-----------+----------+--------------+ FV DistalFull                                                        +---------+---------------+---------+-----------+----------+--------------+ PFV      Full                                                        +---------+---------------+---------+-----------+----------+--------------+ POP      Full           Yes      Yes                                 +---------+---------------+---------+-----------+----------+--------------+ PTV      Full                                                        +---------+---------------+---------+-----------+----------+--------------+ PERO     Full                                                        +---------+---------------+---------+-----------+----------+--------------+   +---------+---------------+---------+-----------+----------+--------------+  LEFT     CompressibilityPhasicitySpontaneityPropertiesThrombus Aging +---------+---------------+---------+-----------+----------+--------------+ CFV      Full           Yes      Yes                                 +---------+---------------+---------+-----------+----------+--------------+ SFJ      Full                                                        +---------+---------------+---------+-----------+----------+--------------+ FV Prox  Full                                                        +---------+---------------+---------+-----------+----------+--------------+ FV Mid   Full                                                        +---------+---------------+---------+-----------+----------+--------------+ FV DistalFull                                                        +---------+---------------+---------+-----------+----------+--------------+ PFV      Full                                                         +---------+---------------+---------+-----------+----------+--------------+ POP      Full           Yes      Yes                                 +---------+---------------+---------+-----------+----------+--------------+ PTV      Full                                                        +---------+---------------+---------+-----------+----------+--------------+ PERO     Full                                                        +---------+---------------+---------+-----------+----------+--------------+     Summary: RIGHT: - There is no evidence of deep vein thrombosis in the lower extremity.  - No cystic structure found in the popliteal fossa.  LEFT: - There is no evidence of deep vein thrombosis in the lower extremity.  - No  cystic structure found in the popliteal fossa.  *See table(s) above for measurements and observations. Electronically signed by Ruta Hinds MD on 04/11/2021 at 4:27:28 PM.    Final    . atorvastatin  80 mg Oral Daily  . Chlorhexidine Gluconate Cloth  6 each Topical Q0600  . escitalopram  10 mg Oral Daily  . gentamicin cream  1 application Topical Daily  . dianeal solution for CAPD/CCPD with heparin   Peritoneal Dialysis Q24H  . heparin  5,000 Units Subcutaneous Q8H  . insulin aspart  0-6 Units Subcutaneous TID WC  . insulin glargine  15 Units Subcutaneous QHS  . pantoprazole  40 mg Oral Q supper  . vancomycin variable dose per unstable renal function (pharmacist dosing)   Does not apply See admin instructions    BMET    Component Value Date/Time   NA 132 (L) 04/12/2021 0224   K 4.3 04/12/2021 0224   CL 95 (L) 04/12/2021 0224   CO2 25 04/12/2021 0224   GLUCOSE 336 (H) 04/12/2021 0224   BUN 47 (H) 04/12/2021 0224   CREATININE 6.22 (H) 04/12/2021 0224   CALCIUM 8.2 (L) 04/12/2021 0224   GFRNONAA 9 (L) 04/12/2021 0224   GFRAA 10 (L) 01/11/2019 0523   CBC    Component Value Date/Time   WBC 4.1 04/12/2021 0224   RBC 3.06 (L) 04/12/2021 0224    HGB 9.3 (L) 04/12/2021 0224   HCT 30.6 (L) 04/12/2021 0224   PLT 109 (L) 04/12/2021 0224   MCV 100.0 04/12/2021 0224   MCH 30.4 04/12/2021 0224   MCHC 30.4 04/12/2021 0224   RDW 15.3 04/12/2021 0224     Dialysis Orders: Center:[facility], Darlina Rumpf CCPD with 3 exchanges, 2700 ml fill volume, fill time 30 minutes, drain time 30 minutes, dwell time 2 hours.  NEPHROLOGIST: Claris Gladden  LOCATION: [place]  SCHEDULE: M-W-F 2nd Shift  EDW: TBD kg.  KIDNEY:  LITERS PROC: liters/treatment  HD TIME: 4 hrs. 0 min.  ACCESS: L Upper Arm AVF  NEEDLE SIZE: 15 ga.  ANTICOAG: Standard Heparin Per Protocol  BATH: 2.0 K-2.5Ca 35 HCO3  QB: 500 ml/min  QD: Auto Flow Protocol    Assessment/Plan: 1. NSTEMI - Cardiology consulted and has EKG changes. ECHO with significant decrease in EF from April 2022.  Will likely require LHC this admission. 2. Acute systolic CHF - EF 29-56% (down from 55% in April).  Started on levophed yesterday and tolerated 2 liters UF with HD 04/10/21.  No uF with CCPD last night so will plan on HD today and UF as tolerated.  Heart failure team consulted.   3. ESRD- Normally does CCPD at home but had issues with machine and draining so had sporadic treatments and was set up for IHD day he presented [facility] ED.  He tolerated HD well last night with UF of 2 liters.   1. Plan to transition back to HD today due to inadequate UF with CCPD and issues with draining.     2. Given miralax daily to help with function of PD. 4. Hypertension/volume- volume overload and plan for HD with UF as above.  ECHO with drop in EF started on levophed with improved BP.  Currently off levophed. 5. 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 Will dose with ESA and transfuse prn. 6. Metabolic bone disease- Continue with home meds 7. Nutrition- Renal diet, carb modified 8. Vascular access: LUE AVF +T/B 9. Diabetic heel/foot ulcer - Vascular surgery consulted as well as  Dr. Sharol Given.  Currently on vanc/zosyn.  10. Hypokalemia - will replete with IV KCl x 2 runs and follow 11. Hypomagnesemia - will replete with 2 grams IV and follow.    Donetta Potts, MD [employer] [phone]

## 2021-04-12 NOTE — Progress Notes (Signed)
[facility]  PROGRESS NOTE  Maxwell Harmon:[id number] DOB: 1956/07/21 DOA: 04/10/2021 PCP: [name], DO   Brief HPI:   65 year old male with history of CAD s/p CABG in 2021, preserved EF, Charcot foot, ESRD on peritoneal dialysis presented with cellulitis of left foot/diabetic foot wound.  Patient was admitted to ICU started on broad-spectrum antibiotic for diabetic foot wound.  Nephrology was consulted and patient was switched to hemodialysis due to PD catheter issues. Patient significantly improved so he was transferred out of ICU    Subjective   Patient seen and examined, denies chest pain or shortness of breath.   Assessment/Plan:     Sepsis due to left lower extremity cellulitis -Sepsis physiology has resolved -Continue vancomycin and Zosyn -Blood cultures x2 are negative to date   Metabolic encephalopathy -Resolved -Likely from above   Left diabetic foot wound/PAD -Dr. Sharol Given was consulted -Vascular surgery has seen the patient; plan to take for aortogram later this week -X-ray of left foot tibia and fibula was negative for osteomyelitis   ESRD -Patient has been on peritoneal dialysis at home -Switched to IHD given for fluid return -Nephrology following   Acute systolic heart failure -Echocardiogram during this admission showed EF of 30 to 35% -Stress echo from April 2022 showed EF of 55% -Patient currently on HD  -Patient has history of CABG ; cardiology planning to take for left heart cath later this week   Diabetes mellitus type 2 -Continue Lantus 15 units subcu daily -Continue very sensitive sliding scale insulin -Blood glucose is labile; we will continue to monitor blood glucose and adjust insulin as needed    Scheduled medications:   . atorvastatin  80 mg Oral Daily  . Chlorhexidine Gluconate Cloth  6 each Topical Q0600  . escitalopram  10 mg Oral Daily  . gentamicin cream  1 application Topical Daily  . dianeal solution for  CAPD/CCPD with heparin   Peritoneal Dialysis Q24H  . heparin  5,000 Units Subcutaneous Q8H  . insulin aspart  0-6 Units Subcutaneous TID WC  . insulin glargine  15 Units Subcutaneous QHS  . pantoprazole  40 mg Oral Q supper  . pentafluoroprop-tetrafluoroeth      . vancomycin variable dose per unstable renal function (pharmacist dosing)   Does not apply See admin instructions         Data Reviewed:   CBG:  Recent Labs  Lab 04/11/21 1144 04/11/21 1623 04/11/21 2223 04/12/21 0604 04/12/21 1121  GLUCAP 88 102* 270* 306* 161*    SpO2: 100 % O2 Flow Rate (L/min): 2 L/min    Vitals:   04/12/21 1615 04/12/21 1645 04/12/21 1715 04/12/21 1749  BP: 125/70 139/74 130/67 (!) 145/77  Pulse: 77 81 79 82  Resp:    (!) 22  Temp:    (!) 97.5 F (36.4 C)  TempSrc:    Oral  SpO2:    100%  Weight:    89.8 kg  Height:         Intake/Output Summary (Last 24 hours) at 04/12/2021 1839 Last data filed at 04/12/2021 1749 Gross per 24 hour  Intake 700 ml  Output 2553 ml  Net -1853 ml    06/06 1901 - 06/08 0700 In: 706.9 [P.O.:120; I.V.:10] Out: 2296 [Urine:50]  Filed Weights   04/12/21 0720 04/12/21 1350 04/12/21 1749  Weight: 92.2 kg 92.2 kg 89.8 kg    CBC:  Recent Labs  Lab 04/10/21 1033 04/10/21 2132 04/10/21 2133 04/11/21 0114 04/12/21 0224  WBC 5.9  --  4.4 3.8* 4.1  HGB 9.1*  --  8.7* 8.7* 9.3*  HCT 29.0*  --  28.2* 26.9* 30.6*  PLT 109* 97* 113* 99* 109*  MCV 99.0  --  98.9 96.1 100.0  MCH 31.1  --  30.5 31.1 30.4  MCHC 31.4  --  30.9 32.3 30.4  RDW 14.9  --  15.0 15.0 15.3    Complete metabolic panel:  Recent Labs  Lab 04/10/21 1033 04/10/21 1640 04/10/21 1725 04/10/21 1819 04/10/21 2118 04/10/21 2131 04/10/21 2132 04/10/21 2224 04/11/21 0114 04/12/21 0224  NA 136  --   --   --  136  --   --   --  135 132*  K 4.5  --   --   --  4.3  --   --   --  3.0* 4.3  CL 98  --   --   --  97*  --   --   --  98 95*  CO2 23  --   --   --  25  --   --   --   26 25  GLUCOSE 106*  --   --   --  57*  --   --   --  101* 336*  BUN 82*  --   --   --  87*  --   --   --  33* 47*  CREATININE 9.76*  --   --   --  10.25*  --   --   --  4.44* 6.22*  CALCIUM 8.7*  --   --   --  8.1*  --   --   --  8.2* 8.2*  AST  --   --  23  --   --  29  --  23  --   --   ALT  --   --  15  --   --  14  --  15  --   --   ALKPHOS  --   --  86  --   --  79  --  85  --   --   BILITOT  --   --  0.7  --   --  1.0  --  1.0  --   --   ALBUMIN  --   --  2.3*  --  2.4* 2.3*  --  2.2*  --  2.4*  MG  --   --   --   --   --   --   --   --  1.7 2.1  DDIMER  --   --   --   --   --   --  2.86*  --   --   --   LATICACIDVEN  --   --   --  1.0  --   --   --  0.9  --   --   INR  --   --   --   --   --   --  1.2  --   --   --   HGBA1C  --  8.2*  --   --   --   --   --   --   --   --   BNP 4,239.3*  --   --   --   --   --   --   --   --   --     Recent Labs  Lab 04/10/21 2131  LIPASE 25  AMYLASE 27*    Recent Labs  Lab 04/10/21 1033 04/10/21 1150 04/10/21 2132  DDIMER  --   --  2.86*  BNP 4,239.3*  --   --   SARSCOV2NAA  --  NEGATIVE  --     ------------------------------------------------------------------------------------------------------------------ Recent Labs    04/10/21 1640  CHOL 122  HDL 32*  LDLCALC 74  TRIG 81  CHOLHDL 3.8    Lab Results  Component Value Date   HGBA1C 8.2 (H) 04/10/2021   ------------------------------------------------------------------------------------------------------------------ No results for input(s): TSH, T4TOTAL, T3FREE, THYROIDAB in the last 72 hours.  Invalid input(s): FREET3 ------------------------------------------------------------------------------------------------------------------ No results for input(s): VITAMINB12, FOLATE, FERRITIN, TIBC, IRON, RETICCTPCT in the last 72 hours.  Coagulation profile Recent Labs  Lab 04/10/21 2132  INR 1.2   Recent Labs    04/10/21 2132  DDIMER 2.86*    Cardiac Enzymes No  results for input(s): CKTOTAL, CKMB, CKMBINDEX, TROPONINI in the last 168 hours.  ------------------------------------------------------------------------------------------------------------------    Component Value Date/Time   BNP 4,239.3 (H) 04/10/2021 1033     Antibiotics: Anti-infectives (From admission, onward)   Start     Dose/Rate Route Frequency Ordered Stop   04/11/21 1030  vancomycin (VANCOREADY) IVPB 1500 mg/300 mL        1,500 mg 150 mL/hr over 120 Minutes Intravenous  Once 04/11/21 0937 04/11/21 1329   04/11/21 0400  piperacillin-tazobactam (ZOSYN) IVPB 2.25 g        2.25 g 100 mL/hr over 30 Minutes Intravenous Every 8 hours 04/10/21 1951     04/10/21 1951  vancomycin variable dose per unstable renal function (pharmacist dosing)         Does not apply See admin instructions 04/10/21 1951     04/10/21 1900  vancomycin (VANCOREADY) IVPB 1750 mg/350 mL        1,750 mg 175 mL/hr over 120 Minutes Intravenous  Once 04/10/21 1836 04/10/21 2322   04/10/21 1845  piperacillin-tazobactam (ZOSYN) IVPB 3.375 g        3.375 g 100 mL/hr over 30 Minutes Intravenous  Once 04/10/21 1836 04/10/21 2121       Radiology Reports  DG Tibia/Fibula Left  Result Date: 04/10/2021 CLINICAL DATA:  Left lower extremity nonhealing wound EXAM: LEFT TIBIA AND FIBULA - 2 VIEW COMPARISON:  None. FINDINGS: Three view radiograph left knee demonstrates normal alignment. No fracture or dislocation. No osseous erosion. No effusion. Advanced vascular calcifications are seen throughout the left lower extremity. IMPRESSION: Peripheral vascular disease.  No osseous erosion. Electronically Signed   By: Fidela Salisbury MD   On: 04/10/2021 20:03   DG Foot 2 Views Left  Result Date: 04/10/2021 CLINICAL DATA:  Diabetic foot wound EXAM: LEFT FOOT - 2 VIEW COMPARISON:  None. FINDINGS: Two view radiograph left foot demonstrates normal alignment. No fracture or dislocation. No osseous erosion. Advanced vascular  calcifications are seen throughout the left foot. Moderate plantar calcaneal spur. Joint spaces are preserved. IMPRESSION: Peripheral vascular disease with advanced vascular calcifications throughout the left foot. No osseous erosion. Electronically Signed   By: Fidela Salisbury MD   On: 04/10/2021 20:02   VAS [place] ABI WITH/WO TBI  Result Date: 04/11/2021  LOWER EXTREMITY DOPPLER STUDY Patient Name:  Maxwell Harmon  Date of Exam:   04/11/2021 Medical Rec #: [id number]        Accession #:    [id number] Date of Birth: 06-25-1956        Patient Gender: M Patient Age:  89N Exam Location:  [facility] Procedure:      VAS [place] ABI WITH/WO TBI Referring Phys: [id number] Yevonne Aline HAWKEN --------------------------------------------------------------------------------  Indications: Ulceration, and peripheral artery disease. High Risk Factors: Hypertension, Diabetes, coronary artery disease s/p CABG.  Comparison Study: No prior studies. Performing Technologist: [name] RDMS RVT  Examination Guidelines: A complete evaluation includes at minimum, Doppler waveform signals and systolic blood pressure reading at the level of bilateral brachial, anterior tibial, and posterior tibial arteries, when vessel segments are accessible. Bilateral testing is considered an integral part of a complete examination. Photoelectric Plethysmograph (PPG) waveforms and toe systolic pressure readings are included as required and additional duplex testing as needed. Limited examinations for reoccurring indications may be performed as noted.  ABI Findings: +---------+------------------+-----+-------------------+-----------------------+ Right    Rt Pressure (mmHg)IndexWaveform           Comment                 +---------+------------------+-----+-------------------+-----------------------+ Brachial 117                    triphasic          Unable to obtain due to                                                    IV, pressure  obtained                                                      from RT forearm         +---------+------------------+-----+-------------------+-----------------------+ PTA      255               2.18 monophasic                                 +---------+------------------+-----+-------------------+-----------------------+ DP       61                0.52 dampened monophasic                        +---------+------------------+-----+-------------------+-----------------------+ Great Toe29                0.25 Abnormal                                   +---------+------------------+-----+-------------------+-----------------------+ +---------+------------------+-----+-------------------+-------+ Left     Lt Pressure (mmHg)IndexWaveform           Comment +---------+------------------+-----+-------------------+-------+ Brachial                                           AVF     +---------+------------------+-----+-------------------+-------+ PTA      255               2.18 dampened monophasic        +---------+------------------+-----+-------------------+-------+ DP       255  2.18 dampened monophasic        +---------+------------------+-----+-------------------+-------+ Great Toe0                 0.00 Absent                     +---------+------------------+-----+-------------------+-------+ +-------+-----------+-----------+------------+------------+ ABI/TBIToday's ABIToday's TBIPrevious ABIPrevious TBI +-------+-----------+-----------+------------+------------+ Right  [place]         29                                  +-------+-----------+-----------+------------+------------+ Left   [place]         0                                   +-------+-----------+-----------+------------+------------+ Arterial wall calcification precludes accurate ankle pressures and ABIs.  Summary: Right: Resting right ankle-brachial index indicates noncompressible  right lower extremity arteries. The right toe-brachial index is abnormal. ABIs are unreliable. Left: Resting left ankle-brachial index indicates noncompressible left lower extremity arteries. The left toe-brachial index is abnormal. ABIs are unreliable.  *See table(s) above for measurements and observations.  Electronically signed by Ruta Hinds MD on 04/11/2021 at 4:23:15 PM.    Final    VAS [place] LOWER EXTREMITY VENOUS (DVT)  Result Date: 04/11/2021  Lower Venous DVT Study Patient Name:  Maxwell Harmon  Date of Exam:   04/11/2021 Medical Rec #: [id number]        Accession #:    [id number] Date of Birth: 1955/11/30        Patient Gender: M Patient Age:   66Y Exam Location:  [facility] Procedure:      VAS [place] LOWER EXTREMITY VENOUS (DVT) Referring Phys: [id number] PHILIP J NAHSER --------------------------------------------------------------------------------  Indications: Swelling, and CHF.  Limitations: Heavy overlying arterial calcification bilaterally. Comparison Study: No prior studies. Performing Technologist: [name] RDMS,RVT  Examination Guidelines: A complete evaluation includes B-mode imaging, spectral Doppler, color Doppler, and power Doppler as needed of all accessible portions of each vessel. Bilateral testing is considered an integral part of a complete examination. Limited examinations for reoccurring indications may be performed as noted. The reflux portion of the exam is performed with the patient in reverse Trendelenburg.  +---------+---------------+---------+-----------+----------+--------------+ RIGHT    CompressibilityPhasicitySpontaneityPropertiesThrombus Aging +---------+---------------+---------+-----------+----------+--------------+ CFV      Full           Yes      Yes                                 +---------+---------------+---------+-----------+----------+--------------+ SFJ      Full                                                         +---------+---------------+---------+-----------+----------+--------------+ FV Prox  Full                                                        +---------+---------------+---------+-----------+----------+--------------+ FV Mid   Full                                                        +---------+---------------+---------+-----------+----------+--------------+  FV DistalFull                                                        +---------+---------------+---------+-----------+----------+--------------+ PFV      Full                                                        +---------+---------------+---------+-----------+----------+--------------+ POP      Full           Yes      Yes                                 +---------+---------------+---------+-----------+----------+--------------+ PTV      Full                                                        +---------+---------------+---------+-----------+----------+--------------+ PERO     Full                                                        +---------+---------------+---------+-----------+----------+--------------+   +---------+---------------+---------+-----------+----------+--------------+ LEFT     CompressibilityPhasicitySpontaneityPropertiesThrombus Aging +---------+---------------+---------+-----------+----------+--------------+ CFV      Full           Yes      Yes                                 +---------+---------------+---------+-----------+----------+--------------+ SFJ      Full                                                        +---------+---------------+---------+-----------+----------+--------------+ FV Prox  Full                                                        +---------+---------------+---------+-----------+----------+--------------+ FV Mid   Full                                                         +---------+---------------+---------+-----------+----------+--------------+ FV DistalFull                                                        +---------+---------------+---------+-----------+----------+--------------+  PFV      Full                                                        +---------+---------------+---------+-----------+----------+--------------+ POP      Full           Yes      Yes                                 +---------+---------------+---------+-----------+----------+--------------+ PTV      Full                                                        +---------+---------------+---------+-----------+----------+--------------+ PERO     Full                                                        +---------+---------------+---------+-----------+----------+--------------+     Summary: RIGHT: - There is no evidence of deep vein thrombosis in the lower extremity.  - No cystic structure found in the popliteal fossa.  LEFT: - There is no evidence of deep vein thrombosis in the lower extremity.  - No cystic structure found in the popliteal fossa.  *See table(s) above for measurements and observations. Electronically signed by Ruta Hinds MD on 04/11/2021 at 4:27:28 PM.    Final       DVT prophylaxis: Heparin  Code Status: Full code  Family Communication: No family at bedside   Consultants:  Cardiology  Vascular surgery  PCCM  Procedures:      Objective    Physical Examination:    General-appears in no acute distress  Heart-S1-S2, regular, no murmur auscultated  Lungs-clear to auscultation bilaterally, no wheezing or crackles auscultated  Abdomen-soft, nontender, no organomegaly  Extremities-trace edema in the lower extremities  Neuro-alert, oriented x3, no focal deficit noted   Status is: Inpatient  Dispo: The patient is from: Home              Anticipated d/c is to: Home              Anticipated d/c date is:  04/16/2021              Patient currently not stable for discharge  Barrier to discharge-awaiting heart catheterization  COVID-19 Labs  Recent Labs    04/10/21 2132  DDIMER 2.86*    Lab Results  Component Value Date   [place] NEGATIVE 04/10/2021    Microbiology  Recent Results (from the past 240 hour(s))  Resp Panel by RT-PCR (Flu A&B, Covid) Nasopharyngeal Swab     Status: None   Collection Time: 04/10/21 11:50 AM   Specimen: Nasopharyngeal Swab; Nasopharyngeal(NP) swabs in vial transport medium  Result Value Ref Range Status   SARS Coronavirus 2 by RT PCR NEGATIVE NEGATIVE Final    Comment: (NOTE) SARS-CoV-2 target nucleic acids are NOT DETECTED.  The SARS-CoV-2 RNA is generally detectable in upper respiratory specimens during the  acute phase of infection. The lowest concentration of SARS-CoV-2 viral copies this assay can detect is 138 copies/mL. A negative result does not preclude SARS-Cov-2 infection and should not be used as the sole basis for treatment or other patient management decisions. A negative result may occur with  improper specimen collection/handling, submission of specimen other than nasopharyngeal swab, presence of viral mutation(s) within the areas targeted by this assay, and inadequate number of viral copies(<138 copies/mL). A negative result must be combined with clinical observations, patient history, and epidemiological information. The expected result is Negative.  Fact Sheet for Patients:  EntrepreneurPulse.com.au  Fact Sheet for Healthcare Providers:  IncredibleEmployment.be  This test is no t yet approved or cleared by the [place] FDA and  has been authorized for detection and/or diagnosis of SARS-CoV-2 by FDA under an Emergency Use Authorization (EUA). This EUA will remain  in effect (meaning this test can be used) for the duration of the COVID-19 declaration under Section 564(b)(1) of the  Act, 21 U.S.C.section 360bbb-3(b)(1), unless the authorization is terminated  or revoked sooner.       Influenza A by PCR NEGATIVE NEGATIVE Final   Influenza B by PCR NEGATIVE NEGATIVE Final    Comment: (NOTE) The Xpert Xpress SARS-CoV-2/FLU/RSV plus assay is intended as an aid in the diagnosis of influenza from Nasopharyngeal swab specimens and should not be used as a sole basis for treatment. Nasal washings and aspirates are unacceptable for Xpert Xpress SARS-CoV-2/FLU/RSV testing.  Fact Sheet for Patients: EntrepreneurPulse.com.au  Fact Sheet for Healthcare Providers: IncredibleEmployment.be  This test is not yet approved or cleared by the [place] FDA and has been authorized for detection and/or diagnosis of SARS-CoV-2 by FDA under an Emergency Use Authorization (EUA). This EUA will remain in effect (meaning this test can be used) for the duration of the COVID-19 declaration under Section 564(b)(1) of the Act, 21 U.S.C. section 360bbb-3(b)(1), unless the authorization is terminated or revoked.  Performed at [facility], [place] [address] [place], [place] [address]   Culture, blood (routine x 2)     Status: None (Preliminary result)   Collection Time: 04/10/21 10:24 PM   Specimen: BLOOD  Result Value Ref Range Status   Specimen Description BLOOD SITE NOT SPECIFIED  Final   Special Requests   Final    BOTTLES DRAWN AEROBIC AND ANAEROBIC Blood Culture adequate volume   Culture   Final    NO GROWTH 1 DAY Performed at [facility], [place] [address] [place], [place] [address]    Report Status PENDING  Incomplete  Culture, blood (routine x 2)     Status: None (Preliminary result)   Collection Time: 04/10/21 10:24 PM   Specimen: BLOOD  Result Value Ref Range Status   Specimen Description BLOOD SITE NOT SPECIFIED  Final   Special Requests   Final    BOTTLES DRAWN AEROBIC AND ANAEROBIC Blood Culture adequate volume    Culture   Final    NO GROWTH 1 DAY Performed at [facility], [place] [address], [place],  [address]    Report Status PENDING  Incomplete  MRSA PCR Screening     Status: None   Collection Time: 04/11/21 10:16 AM   Specimen: Nasal Mucosa; Nasopharyngeal  Result Value Ref Range Status   MRSA by PCR NEGATIVE NEGATIVE Final    Comment:        The GeneXpert MRSA Assay (FDA approved for NASAL specimens only), is one component of  a comprehensive MRSA colonization surveillance program. It is not intended to diagnose MRSA infection nor to guide or monitor treatment for MRSA infections. Performed at [facility], [place] [address] [place], [place] [address]     Pressure Injury 04/10/21 Heel Left;Lateral Stage 3 -  Full thickness tissue loss. Subcutaneous fat may be visible but bone, tendon or muscle are NOT exposed. (Active)  04/10/21 2200  Location: Heel  Location Orientation: Left;Lateral  Staging: Stage 3 -  Full thickness tissue loss. Subcutaneous fat may be visible but bone, tendon or muscle are NOT exposed.  Wound Description (Comments):   Present on Admission: Yes     Pressure Injury 04/10/21 Toe (Comment  which one) Anterior;Left Stage 2 -  Partial thickness loss of dermis presenting as a shallow open injury with a red, pink wound bed without slough. (Active)  04/10/21 2200  Location: Toe (Comment  which one) (dorsal great toe)  Location Orientation: Anterior;Left  Staging: Stage 2 -  Partial thickness loss of dermis presenting as a shallow open injury with a red, pink wound bed without slough.  Wound Description (Comments):   Present on Admission: Yes          [place]   [facility] If 7PM-7AM, please contact night-coverage at [email or web address], Office  [phone]   04/12/2021, 6:39 PM  LOS: 2 days

## 2021-04-12 NOTE — Progress Notes (Signed)
   VASCULAR SURGERY ASSESSMENT & PLAN:   PAD/CLI: tissue loss of left foot. Ongoing optimization of medical issues with plan for aortogram with LLE run-off this admission. Plavix continues. Hgb 9.3. Tm 99.2 yesterday afternoon.  No leukocytosis.  CAD/CHF: No chest pain, DOE. Possible heart cath this admission per cardiology.  ESRD: HD recently via LUE brachiocephalic AVF. Also on PD.  SUBJECTIVE:   Sitting up on side of bed eating breakfast and talking on cell phone. States he didn't get good fluid exchange with PD last night  PHYSICAL EXAM:   Vitals:   04/12/21 0609 04/12/21 0720 04/12/21 0722 04/12/21 0743  BP: 136/77  (!) 142/82 135/90  Pulse: 85  82 88  Resp: 18  17 18   Temp: 97.9 F (36.6 C)  98.5 F (36.9 C) 98.8 F (37.1 C)  TempSrc: Oral  Oral Oral  SpO2: 99%   91%  Weight:  92.2 kg    Height:       General appearance: Awake, alert in no apparent distress Cardiac: Heart rate and rhythm are regular Respirations: Nonlabored; CTAB Extremities: Both feet are warm with intact sensation and motor function.  Left heel and great toe wounds changes noted and unchanged.  LABS:   Lab Results  Component Value Date   WBC 4.1 04/12/2021   HGB 9.3 (L) 04/12/2021   HCT 30.6 (L) 04/12/2021   MCV 100.0 04/12/2021   PLT 109 (L) 04/12/2021   Lab Results  Component Value Date   CREATININE 6.22 (H) 04/12/2021   Lab Results  Component Value Date   INR 1.2 04/10/2021   CBG (last 3)  Recent Labs    04/11/21 1623 04/11/21 2223 04/12/21 0604  GLUCAP 102* 270* 306*    PROBLEM LIST:    Active Problems:   Acute heart failure (HCC)   Uremia   Pressure injury of skin   CURRENT MEDS:   . atorvastatin  80 mg Oral Daily  . Chlorhexidine Gluconate Cloth  6 each Topical Q0600  . escitalopram  10 mg Oral Daily  . gentamicin cream  1 application Topical Daily  . dianeal solution for CAPD/CCPD with heparin   Peritoneal Dialysis Q24H  . heparin  5,000 Units Subcutaneous  Q8H  . insulin aspart  0-6 Units Subcutaneous TID WC  . pantoprazole (PROTONIX) IV  40 mg Intravenous QHS  . vancomycin variable dose per unstable renal function (pharmacist dosing)   Does not apply See admin instructions    Barbie Banner, PA-C Office: [phone] 04/12/2021

## 2021-04-13 DIAGNOSIS — N186 End stage renal disease: Secondary | ICD-10-CM

## 2021-04-13 DIAGNOSIS — Z992 Dependence on renal dialysis: Secondary | ICD-10-CM

## 2021-04-13 LAB — CBC
HCT: 28.2 % — ABNORMAL LOW (ref 39.0–52.0)
Hemoglobin: 8.8 g/dL — ABNORMAL LOW (ref 13.0–17.0)
MCH: 30.8 pg (ref 26.0–34.0)
MCHC: 31.2 g/dL (ref 30.0–36.0)
MCV: 98.6 fL (ref 80.0–100.0)
Platelets: 108 10*3/uL — ABNORMAL LOW (ref 150–400)
RBC: 2.86 MIL/uL — ABNORMAL LOW (ref 4.22–5.81)
RDW: 15.3 % (ref 11.5–15.5)
WBC: 4 10*3/uL (ref 4.0–10.5)
nRBC: 0 % (ref 0.0–0.2)

## 2021-04-13 LAB — BASIC METABOLIC PANEL
Anion gap: 10 (ref 5–15)
BUN: 33 mg/dL — ABNORMAL HIGH (ref 8–23)
CO2: 27 mmol/L (ref 22–32)
Calcium: 8.3 mg/dL — ABNORMAL LOW (ref 8.9–10.3)
Chloride: 97 mmol/L — ABNORMAL LOW (ref 98–111)
Creatinine, Ser: 5.33 mg/dL — ABNORMAL HIGH (ref 0.61–1.24)
GFR, Estimated: 11 mL/min — ABNORMAL LOW (ref >60)
Glucose, Bld: 180 mg/dL — ABNORMAL HIGH (ref 70–99)
Potassium: 3.9 mmol/L (ref 3.5–5.1)
Sodium: 134 mmol/L — ABNORMAL LOW (ref 135–145)

## 2021-04-13 LAB — GLUCOSE, CAPILLARY
Glucose-Capillary: 118 mg/dL — ABNORMAL HIGH (ref 70–99)
Glucose-Capillary: 171 mg/dL — ABNORMAL HIGH (ref 70–99)
Glucose-Capillary: 170 mg/dL — ABNORMAL HIGH (ref 70–99)
Glucose-Capillary: 187 mg/dL — ABNORMAL HIGH (ref 70–99)

## 2021-04-13 MED ORDER — SALINE SPRAY 0.65 % NA SOLN
1.0000 | NASAL | Status: DC | PRN
  Administered 2021-04-14: 1 via NASAL
  Filled 2021-04-13: qty 44

## 2021-04-13 NOTE — Progress Notes (Signed)
Patient ID: Maxwell Harmon, male   DOB: Mar 23, 1956, 65 y.o.   MRN: [id number] S:Feels a little better today. O:BP 121/85 (BP Location: Right Arm)   Pulse 84   Temp 98.4 F (36.9 C) (Oral)   Resp 18   Ht 5\' 11"  (1.803 m)   Wt 91.9 kg   SpO2 92%   BMI 28.26 kg/m   Intake/Output Summary (Last 24 hours) at 04/13/2021 1150 Last data filed at 04/13/2021 0851 Gross per 24 hour  Intake 540 ml  Output 2403 ml  Net -1863 ml   Intake/Output: I/O last 3 completed shifts: In: 700 [P.O.:540; I.V.:10; IV Piggyback:150] Out: 2409 [Urine:150; Other:2403]  Intake/Output this shift:  Total I/O In: 360 [P.O.:360] Out: -  Weight change: -0.107 kg Gen: NAD CVS: RRR Resp: CTA Abd:+BS, soft, Nt/ND, PD catheter with dressing in place Ext: trace pretibial edema bilaterally, left foot with bandages in place  Recent Labs  Lab 04/10/21 1033 04/10/21 1725 04/10/21 2118 04/10/21 2131 04/10/21 2224 04/11/21 0114 04/12/21 0224 04/13/21 0257  NA 136  --  136  --   --  135 132* 134*  K 4.5  --  4.3  --   --  3.0* 4.3 3.9  CL 98  --  97*  --   --  98 95* 97*  CO2 23  --  25  --   --  26 25 27   GLUCOSE 106*  --  57*  --   --  101* 336* 180*  BUN 82*  --  87*  --   --  33* 47* 33*  CREATININE 9.76*  --  10.25*  --   --  4.44* 6.22* 5.33*  ALBUMIN  --  2.3* 2.4* 2.3* 2.2*  --  2.4*  --   CALCIUM 8.7*  --  8.1*  --   --  8.2* 8.2* 8.3*  PHOS  --   --  8.1*  --   --  3.5 6.1*  --   AST  --  23  --  29 23  --   --   --   ALT  --  15  --  14 15  --   --   --    Liver Function Tests: Recent Labs  Lab 04/10/21 1725 04/10/21 2118 04/10/21 2131 04/10/21 2224 04/12/21 0224  AST 23  --  29 23  --   ALT 15  --  14 15  --   ALKPHOS 86  --  79 85  --   BILITOT 0.7  --  1.0 1.0  --   PROT 5.5*  --  5.7* 5.2*  --   ALBUMIN 2.3*   < > 2.3* 2.2* 2.4*   < > = values in this interval not displayed.   Recent Labs  Lab 04/10/21 2131  LIPASE 25  AMYLASE 27*   No results for input(s): AMMONIA in the  last 168 hours. CBC: Recent Labs  Lab 04/10/21 1033 04/10/21 2132 04/10/21 2133 04/11/21 0114 04/12/21 0224 04/13/21 0257  WBC 5.9  --  4.4 3.8* 4.1 4.0  HGB 9.1*  --  8.7* 8.7* 9.3* 8.8*  HCT 29.0*  --  28.2* 26.9* 30.6* 28.2*  MCV 99.0  --  98.9 96.1 100.0 98.6  PLT 109*   < > 113* 99* 109* 108*   < > = values in this interval not displayed.   Cardiac Enzymes: No results for input(s): CKTOTAL, CKMB, CKMBINDEX, TROPONINI in the last 168 hours.  CBG: Recent Labs  Lab 04/12/21 1121 04/12/21 1842 04/12/21 2158 04/13/21 0601 04/13/21 1137  GLUCAP 161* 109* 222* 118* 171*    Iron Studies: No results for input(s): IRON, TIBC, TRANSFERRIN, FERRITIN in the last 72 hours. Studies/Results: No results found.  atorvastatin  80 mg Oral Daily   Chlorhexidine Gluconate Cloth  6 each Topical Q0600   escitalopram  10 mg Oral Daily   gentamicin cream  1 application Topical Daily   dianeal solution for CAPD/CCPD with heparin   Peritoneal Dialysis Q24H   heparin  5,000 Units Subcutaneous Q8H   insulin aspart  0-6 Units Subcutaneous TID WC   insulin glargine  15 Units Subcutaneous QHS   pantoprazole  40 mg Oral Q supper   vancomycin variable dose per unstable renal function (pharmacist dosing)   Does not apply See admin instructions    BMET    Component Value Date/Time   NA 134 (L) 04/13/2021 0257   K 3.9 04/13/2021 0257   CL 97 (L) 04/13/2021 0257   CO2 27 04/13/2021 0257   GLUCOSE 180 (H) 04/13/2021 0257   BUN 33 (H) 04/13/2021 0257   CREATININE 5.33 (H) 04/13/2021 0257   CALCIUM 8.3 (L) 04/13/2021 0257   GFRNONAA 11 (L) 04/13/2021 0257   GFRAA 10 (L) 01/11/2019 0523   CBC    Component Value Date/Time   WBC 4.0 04/13/2021 0257   RBC 2.86 (L) 04/13/2021 0257   HGB 8.8 (L) 04/13/2021 0257   HCT 28.2 (L) 04/13/2021 0257   PLT 108 (L) 04/13/2021 0257   MCV 98.6 04/13/2021 0257   MCH 30.8 04/13/2021 0257   MCHC 31.2 04/13/2021 0257   RDW 15.3 04/13/2021 0257      Assessment/Plan: Dialysis Orders: Center: [facility], [place]  on CCPD with 3 exchanges, 2700 ml fill volume, fill time 30 minutes, drain time 30 minutes, dwell time 2 hours.   NEPHROLOGIST: Claris Gladden  LOCATION: [place]  SCHEDULE: M-W-F 2nd Shift  EDW: TBD kg.  KIDNEY:  LITERS PROC: liters/treatment  HD TIME: 4 hrs. 0 min.  ACCESS: L Upper Arm AVF  NEEDLE SIZE: 15 ga.  ANTICOAG: Standard Heparin Per Protocol  BATH: 2.0 K-2.5Ca 35 HCO3  QB: 500 ml/min  QD: Auto Flow Protocol     Assessment/Plan: NSTEMI - Cardiology consulted and has EKG changes.  ECHO with significant decrease in EF from April 2022.  For [place] [facility] tomorrow afternoon. Acute systolic CHF - EF 09-32% (down from 55% in April).  Started on levophed yesterday and tolerated 2 liters UF with HD 04/10/21.  No uF with CCPD but tolerated 2.4 liters with HD yesterday.  Still with some edema so will plan HD with UF tomorrow prior to [facility]  Heart failure team consulted.    ESRD -  Normally does CCPD at home but had issues with machine and draining so had sporadic treatments and was set up for IHD day he presented to [facility] ED.  He tolerated HD well last night with UF of 2 liters.   Transitioned to IHD due to inadequate drainage/UF with CCPD (likely catheter malfunction). Plan for HD tomorrow with UF prior to [facility] which is scheduled at 12:30 pm Will need to have PD catheter evaluated as an outpatient.     Given miralax daily to help with function of PD cath and cont with weekly flushes to keep it patent.  Hypertension/volume  - volume overload and plan for HD with UF as above.  ECHO with  drop in EF started on levophed with improved BP.  Currently off levophed.  Anemia  - due to ESRD.  Will dose with ESA and transfuse prn.  Metabolic bone disease -  Continue with home meds  Nutrition -  Renal diet, carb modified Vascular access:  LUE AVF +T/B Diabetic heel/foot ulcer - Vascular surgery consulted as  well as Dr. Sharol Given.  Currently on vanc/zosyn. Hypokalemia - will replete with IV KCl x 2 runs and follow Hypomagnesemia - repleted with 2 grams IV and follow.   Donetta Potts, MD [employer] [phone]

## 2021-04-13 NOTE — Progress Notes (Signed)
[facility]  PROGRESS NOTE  Maxwell Harmon BCW:[id number] DOB: [birth year] DOA: 04/10/2021 PCP: [name], DO   Brief HPI:   65 year old male with history of CAD s/p CABG in 2021, preserved EF, Charcot foot, ESRD on peritoneal dialysis presented with cellulitis of left foot/diabetic foot wound.  Patient was admitted to ICU started on broad-spectrum antibiotic for diabetic foot wound.  Nephrology was consulted and patient was switched to hemodialysis due to PD catheter issues. Patient significantly improved so he was transferred out of ICU    Subjective   Patient seen and examined, denies chest pain or shortness of breath.   Assessment/Plan:     Sepsis due to left lower extremity cellulitis -Sepsis physiology has resolved -Continue vancomycin and Zosyn -Blood cultures x2 are negative to date   Metabolic encephalopathy -Resolved -Likely from above   Left diabetic foot wound/PAD -Dr. Sharol Given was consulted -Vascular surgery has seen the patient; plan to take for aortogram later this week.  Likely tomorrow. -X-ray of left foot tibia and fibula was negative for osteomyelitis   ESRD -Patient has been on peritoneal dialysis at home -Switched to IHD given for fluid return -Nephrology following   Acute systolic heart failure -Echocardiogram during this admission showed EF of 30 to 35% -Stress echo from April 2022 showed EF of 55% -Patient currently on HD  -Patient has history of CABG ; cardiology planning to take for left heart cath tomorrow afternoon   Diabetes mellitus type 2 -Continue Lantus 15 units subcu daily -Continue very sensitive sliding scale insulin -Blood glucose is labile; we will continue to monitor blood glucose and adjust insulin as needed    Scheduled medications:    atorvastatin  80 mg Oral Daily   Chlorhexidine Gluconate Cloth  6 each Topical Q0600   escitalopram  10 mg Oral Daily   gentamicin cream  1 application Topical Daily   dianeal  solution for CAPD/CCPD with heparin   Peritoneal Dialysis Q24H   heparin  5,000 Units Subcutaneous Q8H   insulin aspart  0-6 Units Subcutaneous TID WC   insulin glargine  15 Units Subcutaneous QHS   pantoprazole  40 mg Oral Q supper   vancomycin variable dose per unstable renal function (pharmacist dosing)   Does not apply See admin instructions         Data Reviewed:   CBG:  Recent Labs  Lab 04/12/21 1121 04/12/21 1842 04/12/21 2158 04/13/21 0601 04/13/21 1137  GLUCAP 161* 109* 222* 118* 171*    SpO2: 92 % O2 Flow Rate (L/min): 2 L/min    Vitals:   04/12/21 2349 04/13/21 0437 04/13/21 0747 04/13/21 1134  BP: 140/78 (!) 147/79 (!) 149/83 121/85  Pulse: 86 88 87 84  Resp: 18 18 20 18   Temp: 98.4 F (36.9 C) (!) 97.5 F (36.4 C) 98 F (36.7 C) 98.4 F (36.9 C)  TempSrc: Oral Oral Oral Oral  SpO2: 98% 93% 100% 92%  Weight:  91.9 kg    Height:         Intake/Output Summary (Last 24 hours) at 04/13/2021 1300 Last data filed at 04/13/2021 0851 Gross per 24 hour  Intake 360 ml  Output 2403 ml  Net -2043 ml    06/07 1901 - 06/09 0700 In: 700 [P.O.:540; I.V.:10] Out: 2553 [Urine:150]  Filed Weights   04/12/21 1350 04/12/21 1749 04/13/21 0437  Weight: 92.2 kg 89.8 kg 91.9 kg    CBC:  Recent Labs  Lab 04/10/21 1033 04/10/21 2132 04/10/21 2133 04/11/21  0114 04/12/21 0224 04/13/21 0257  WBC 5.9  --  4.4 3.8* 4.1 4.0  HGB 9.1*  --  8.7* 8.7* 9.3* 8.8*  HCT 29.0*  --  28.2* 26.9* 30.6* 28.2*  PLT 109* 97* 113* 99* 109* 108*  MCV 99.0  --  98.9 96.1 100.0 98.6  MCH 31.1  --  30.5 31.1 30.4 30.8  MCHC 31.4  --  30.9 32.3 30.4 31.2  RDW 14.9  --  15.0 15.0 15.3 15.3    Complete metabolic panel:  Recent Labs  Lab 04/10/21 1033 04/10/21 1640 04/10/21 1725 04/10/21 1819 04/10/21 2118 04/10/21 2131 04/10/21 2132 04/10/21 2224 04/11/21 0114 04/12/21 0224 04/13/21 0257  NA 136  --   --   --  136  --   --   --  135 132* 134*  K 4.5  --   --   --   4.3  --   --   --  3.0* 4.3 3.9  CL 98  --   --   --  97*  --   --   --  98 95* 97*  CO2 23  --   --   --  25  --   --   --  26 25 27   GLUCOSE 106*  --   --   --  57*  --   --   --  101* 336* 180*  BUN 82*  --   --   --  87*  --   --   --  33* 47* 33*  CREATININE 9.76*  --   --   --  10.25*  --   --   --  4.44* 6.22* 5.33*  CALCIUM 8.7*  --   --   --  8.1*  --   --   --  8.2* 8.2* 8.3*  AST  --   --  23  --   --  29  --  23  --   --   --   ALT  --   --  15  --   --  14  --  15  --   --   --   ALKPHOS  --   --  86  --   --  79  --  85  --   --   --   BILITOT  --   --  0.7  --   --  1.0  --  1.0  --   --   --   ALBUMIN  --   --  2.3*  --  2.4* 2.3*  --  2.2*  --  2.4*  --   MG  --   --   --   --   --   --   --   --  1.7 2.1  --   DDIMER  --   --   --   --   --   --  2.86*  --   --   --   --   LATICACIDVEN  --   --   --  1.0  --   --   --  0.9  --   --   --   INR  --   --   --   --   --   --  1.2  --   --   --   --   HGBA1C  --  8.2*  --   --   --   --   --   --   --   --   --  BNP 4,239.3*  --   --   --   --   --   --   --   --   --   --     Recent Labs  Lab 04/10/21 2131  LIPASE 25  AMYLASE 27*    Recent Labs  Lab 04/10/21 1033 04/10/21 1150 04/10/21 2132  DDIMER  --   --  2.86*  BNP 4,239.3*  --   --   SARSCOV2NAA  --  NEGATIVE  --     ------------------------------------------------------------------------------------------------------------------ Recent Labs    04/10/21 1640  CHOL 122  HDL 32*  LDLCALC 74  TRIG 81  CHOLHDL 3.8    Lab Results  Component Value Date   HGBA1C 8.2 (H) 04/10/2021   ------------------------------------------------------------------------------------------------------------------ No results for input(s): TSH, T4TOTAL, T3FREE, THYROIDAB in the last 72 hours.  Invalid input(s): FREET3 ------------------------------------------------------------------------------------------------------------------ No results for input(s): VITAMINB12,  FOLATE, FERRITIN, TIBC, IRON, RETICCTPCT in the last 72 hours.  Coagulation profile Recent Labs  Lab 04/10/21 2132  INR 1.2   Recent Labs    04/10/21 2132  DDIMER 2.86*    Cardiac Enzymes No results for input(s): CKTOTAL, CKMB, CKMBINDEX, TROPONINI in the last 168 hours.  ------------------------------------------------------------------------------------------------------------------    Component Value Date/Time   BNP 4,239.3 (H) 04/10/2021 1033     Antibiotics: Anti-infectives (From admission, onward)    Start     Dose/Rate Route Frequency Ordered Stop   04/12/21 2000  vancomycin (VANCOREADY) IVPB 750 mg/150 mL        750 mg 150 mL/hr over 60 Minutes Intravenous NOW 04/12/21 1852 04/12/21 2211   04/11/21 1030  vancomycin (VANCOREADY) IVPB 1500 mg/300 mL        1,500 mg 150 mL/hr over 120 Minutes Intravenous  Once 04/11/21 0937 04/11/21 1329   04/11/21 0400  piperacillin-tazobactam (ZOSYN) IVPB 2.25 g        2.25 g 100 mL/hr over 30 Minutes Intravenous Every 8 hours 04/10/21 1951     04/10/21 1951  vancomycin variable dose per unstable renal function (pharmacist dosing)         Does not apply See admin instructions 04/10/21 1951     04/10/21 1900  vancomycin (VANCOREADY) IVPB 1750 mg/350 mL        1,750 mg 175 mL/hr over 120 Minutes Intravenous  Once 04/10/21 1836 04/10/21 2322   04/10/21 1845  piperacillin-tazobactam (ZOSYN) IVPB 3.375 g        3.375 g 100 mL/hr over 30 Minutes Intravenous  Once 04/10/21 1836 04/10/21 2121        Radiology Reports  No results found.     DVT prophylaxis: Heparin  Code Status: Full code  Family Communication: No family at bedside   Consultants: Cardiology Vascular surgery PCCM  Procedures:     Objective    Physical Examination:  General-appears in no acute distress Heart-S1-S2, regular, no murmur auscultated Lungs-clear to auscultation bilaterally, no wheezing or crackles auscultated Abdomen-soft,  nontender, no organomegaly Extremities-no edema in the lower extremities Neuro-alert, oriented x3, no focal deficit noted   Status is: Inpatient  Dispo: The patient is from: Home              Anticipated d/c is to: Home              Anticipated d/c date is: 04/16/2021              Patient currently not stable for discharge  Barrier to discharge-awaiting heart catheterization  COVID-19 Labs  Recent Labs    04/10/21 2132  DDIMER 2.86*    Lab Results  Component Value Date   [place] NEGATIVE 04/10/2021    Microbiology  Recent Results (from the past 240 hour(s))  Resp Panel by RT-PCR (Flu A&B, Covid) Nasopharyngeal Swab     Status: None   Collection Time: 04/10/21 11:50 AM   Specimen: Nasopharyngeal Swab; Nasopharyngeal(NP) swabs in vial transport medium  Result Value Ref Range Status   SARS Coronavirus 2 by RT PCR NEGATIVE NEGATIVE Final    Comment: (NOTE) SARS-CoV-2 target nucleic acids are NOT DETECTED.  The SARS-CoV-2 RNA is generally detectable in upper respiratory specimens during the acute phase of infection. The lowest concentration of SARS-CoV-2 viral copies this assay can detect is 138 copies/mL. A negative result does not preclude SARS-Cov-2 infection and should not be used as the sole basis for treatment or other patient management decisions. A negative result may occur with  improper specimen collection/handling, submission of specimen other than nasopharyngeal swab, presence of viral mutation(s) within the areas targeted by this assay, and inadequate number of viral copies(<138 copies/mL). A negative result must be combined with clinical observations, patient history, and epidemiological information. The expected result is Negative.  Fact Sheet for Patients:  EntrepreneurPulse.com.au  Fact Sheet for Healthcare Providers:  IncredibleEmployment.be  This test is no t yet approved or cleared by the [place] FDA  and  has been authorized for detection and/or diagnosis of SARS-CoV-2 by FDA under an Emergency Use Authorization (EUA). This EUA will remain  in effect (meaning this test can be used) for the duration of the COVID-19 declaration under Section 564(b)(1) of the Act, 21 U.S.C.section 360bbb-3(b)(1), unless the authorization is terminated  or revoked sooner.       Influenza A by PCR NEGATIVE NEGATIVE Final   Influenza B by PCR NEGATIVE NEGATIVE Final    Comment: (NOTE) The Xpert Xpress SARS-CoV-2/FLU/RSV plus assay is intended as an aid in the diagnosis of influenza from Nasopharyngeal swab specimens and should not be used as a sole basis for treatment. Nasal washings and aspirates are unacceptable for Xpert Xpress SARS-CoV-2/FLU/RSV testing.  Fact Sheet for Patients: EntrepreneurPulse.com.au  Fact Sheet for Healthcare Providers: IncredibleEmployment.be  This test is not yet approved or cleared by the [place] FDA and has been authorized for detection and/or diagnosis of SARS-CoV-2 by FDA under an Emergency Use Authorization (EUA). This EUA will remain in effect (meaning this test can be used) for the duration of the COVID-19 declaration under Section 564(b)(1) of the Act, 21 U.S.C. section 360bbb-3(b)(1), unless the authorization is terminated or revoked.  Performed at [facility], [place] [address] [place], [place] [address]   Culture, blood (routine x 2)     Status: None (Preliminary result)   Collection Time: 04/10/21 10:24 PM   Specimen: BLOOD  Result Value Ref Range Status   Specimen Description BLOOD SITE NOT SPECIFIED  Final   Special Requests   Final    BOTTLES DRAWN AEROBIC AND ANAEROBIC Blood Culture adequate volume   Culture   Final    NO GROWTH 2 DAYS Performed at [facility], [address], [place], [place] [address]    Report Status PENDING  Incomplete  Culture, blood (routine x 2)     Status: None  (Preliminary result)   Collection Time: 04/10/21 10:24 PM   Specimen: BLOOD  Result Value Ref Range Status   Specimen Description BLOOD SITE NOT SPECIFIED  Final   Special  Requests   Final    BOTTLES DRAWN AEROBIC AND ANAEROBIC Blood Culture adequate volume   Culture   Final    NO GROWTH 2 DAYS Performed at [facility], [place] [address], [place], [place] [address]    Report Status PENDING  Incomplete  MRSA PCR Screening     Status: None   Collection Time: 04/11/21 10:16 AM   Specimen: Nasal Mucosa; Nasopharyngeal  Result Value Ref Range Status   MRSA by PCR NEGATIVE NEGATIVE Final    Comment:        The GeneXpert MRSA Assay (FDA approved for NASAL specimens only), is one component of a comprehensive MRSA colonization surveillance program. It is not intended to diagnose MRSA infection nor to guide or monitor treatment for MRSA infections. Performed at [facility], [place] [address] [place], [place] [address]     Pressure Injury 04/10/21 Heel Left;Lateral Stage 3 -  Full thickness tissue loss. Subcutaneous fat may be visible but bone, tendon or muscle are NOT exposed. (Active)  04/10/21 2200  Location: Heel  Location Orientation: Left;Lateral  Staging: Stage 3 -  Full thickness tissue loss. Subcutaneous fat may be visible but bone, tendon or muscle are NOT exposed.  Wound Description (Comments):   Present on Admission: Yes     Pressure Injury 04/10/21 Toe (Comment  which one) Anterior;Left Stage 2 -  Partial thickness loss of dermis presenting as a shallow open injury with a red, pink wound bed without slough. (Active)  04/10/21 2200  Location: Toe (Comment  which one) (dorsal great toe)  Location Orientation: Anterior;Left  Staging: Stage 2 -  Partial thickness loss of dermis presenting as a shallow open injury with a red, pink wound bed without slough.  Wound Description (Comments):   Present on Admission: Yes          [place]   [facility] If 7PM-7AM, please contact night-coverage at [email or web address], Office  [phone]   04/13/2021, 1:00 PM  LOS: 3 days

## 2021-04-13 NOTE — Progress Notes (Addendum)
Advanced Heart Failure Rounding Note  PCP-Cardiologist: None   Subjective:    Had iHD 6/8  Remains on vanc + zosyn. BCx NGTD AF. WBC nl.   Feeling ok. Pain in feet when walking.   Objective:   Weight Range: 91.9 kg Body mass index is 28.26 kg/m.   Vital Signs:   Temp:  [97.5 F (36.4 C)-98.4 F (36.9 C)] 98 F (36.7 C) (06/09 0747) Pulse Rate:  [50-88] 87 (06/09 0747) Resp:  [15-22] 20 (06/09 0747) BP: (96-149)/(53-83) 149/83 (06/09 0747) SpO2:  [93 %-100 %] 100 % (06/09 0747) Weight:  [89.8 kg-92.2 kg] 91.9 kg (06/09 0437) Last BM Date: 04/11/21  Weight change: Filed Weights   04/12/21 1350 04/12/21 1749 04/13/21 0437  Weight: 92.2 kg 89.8 kg 91.9 kg    Intake/Output:   Intake/Output Summary (Last 24 hours) at 04/13/2021 0847 Last data filed at 04/12/2021 1749 Gross per 24 hour  Intake 180 ml  Output 2503 ml  Net -2323 ml      Physical Exam    General: No resp difficulty HEENT: normal Neck: supple. JVP 6-7  Carotids 2+ bilat; no bruits. No lymphadenopathy or thryomegaly appreciated. Cor: PMI nondisplaced. Regular rate & rhythm. No rubs, gallops or murmurs. Lungs: clear Abdomen: soft, nontender, nondistended. No hepatosplenomegaly. No bruits or masses. Good bowel sounds. Extremities: no cyanosis, clubbing, rash, edema. LUE AVG Neuro: alert & orientedx3, cranial nerves grossly intact. moves all 4 extremities w/o difficulty. Affect pleasant    Telemetry  SR 80s   EKG    Not performed   Labs    CBC Recent Labs    04/12/21 0224 04/13/21 0257  WBC 4.1 4.0  HGB 9.3* 8.8*  HCT 30.6* 28.2*  MCV 100.0 98.6  PLT 109* 546*   Basic Metabolic Panel Recent Labs    04/11/21 0114 04/12/21 0224 04/13/21 0257  NA 135 132* 134*  K 3.0* 4.3 3.9  CL 98 95* 97*  CO2 26 25 27   GLUCOSE 101* 336* 180*  BUN 33* 47* 33*  CREATININE 4.44* 6.22* 5.33*  CALCIUM 8.2* 8.2* 8.3*  MG 1.7 2.1  --   PHOS 3.5 6.1*  --    Liver Function Tests Recent Labs     04/10/21 2131 04/10/21 2224 04/12/21 0224  AST 29 23  --   ALT 14 15  --   ALKPHOS 79 85  --   BILITOT 1.0 1.0  --   PROT 5.7* 5.2*  --   ALBUMIN 2.3* 2.2* 2.4*   Recent Labs    04/10/21 2131  LIPASE 25  AMYLASE 27*   Cardiac Enzymes No results for input(s): CKTOTAL, CKMB, CKMBINDEX, TROPONINI in the last 72 hours.  BNP: BNP (last 3 results) Recent Labs    04/10/21 1033  BNP 4,239.3*    ProBNP (last 3 results) No results for input(s): PROBNP in the last 8760 hours.   D-Dimer Recent Labs    04/10/21 2132  DDIMER 2.86*   Hemoglobin A1C Recent Labs    04/10/21 1640  HGBA1C 8.2*   Fasting Lipid Panel Recent Labs    04/10/21 1640  CHOL 122  HDL 32*  LDLCALC 74  TRIG 81  CHOLHDL 3.8   Thyroid Function Tests No results for input(s): TSH, T4TOTAL, T3FREE, THYROIDAB in the last 72 hours.  Invalid input(s): FREET3  Other results:   Imaging    No results found.   Medications:     Scheduled Medications:  atorvastatin  80 mg Oral  Daily   Chlorhexidine Gluconate Cloth  6 each Topical Q0600   escitalopram  10 mg Oral Daily   gentamicin cream  1 application Topical Daily   dianeal solution for CAPD/CCPD with heparin   Peritoneal Dialysis Q24H   heparin  5,000 Units Subcutaneous Q8H   insulin aspart  0-6 Units Subcutaneous TID WC   insulin glargine  15 Units Subcutaneous QHS   pantoprazole  40 mg Oral Q supper   vancomycin variable dose per unstable renal function (pharmacist dosing)   Does not apply See admin instructions    Infusions:  sodium chloride 250 mL (04/11/21 1248)   piperacillin-tazobactam (ZOSYN)  IV 2.25 g (04/13/21 0603)    PRN Medications: clonazepam, docusate sodium, ondansetron (ZOFRAN) IV, polyethylene glycol    Patient Profile   Very complicated situation. 65 y/o male with multiple medical problems including DM2, ESRD, charcot joint with active wound, CAD with CABG in 2021 admitted with altered mental status.  Creatinine on admit 10. Switched from PD to HD. Found to have LLE wound with concern PAD/critical limb ischemia. Hs trop 1300   Echo done EF 30-35% (previous EF was 50%) pattern on echo suggestive of possible Tako-Tsubo.   Assessment/Plan   AMS  ?Uremic. Shock?  - WBC was not elevated. Lactic Acid 1.  - Blood CX NGTD  - On vanc/zosyn   2. ESRD -Has been seen at [facility] for possible transplant.  -Previously on PD but ineffective drain last week. Switched iHD - Nephrology following    3. CAD  - H/O CABG 2021  - No chest pain.  - HS Trop 1025>8527. - Eventually with need LHC. Set up tomorrow.     4. Acute Systolic Heart Failure  - Stress ECHO 02/2021 EF ~55%.  - ECHO this admit EF 30-35%  - Fluid management via dialysis - HS Trop 7824>2353.  - LHC once other issues sorted out - No bb for now.  - No spiro /arni/dig with ESRD    5.Charcot right - Dr Sharol Given following. On vancomycin/zosyn.    6. PAD  -LLE concerning for ischemia.  -04/10/21 Vascular consulted. Plan for  Arteriogram to assess flow.    7. Anemia - Hgb stable 8.8     8. DMII - On SSI  Set up [place] tomorrow.   Length of Stay: 3  Amy Clegg, NP  04/13/2021, 8:47 AM  Advanced Heart Failure Team Pager [phone] (M-F; 7a - 5p)  Please contact [facility] for night-coverage after hours (5p -7a ) and weekends on [email or web address]  Patient seen and examined with the above-signed Advanced Practice Provider and/or Housestaff. I personally reviewed laboratory data, imaging studies and relevant notes. I independently examined the patient and formulated the important aspects of the plan. I have edited the note to reflect any of my changes or salient points. I have personally discussed the plan with the patient and/or family.  Volume status and mental status much improved with HD. Family feels he is back to baseline. No CP or SOB   General:  Sitting up in bed No resp difficulty HEENT: normal Neck: supple. no JVD. Carotids 2+  bilat; no bruits. No lymphadenopathy or thryomegaly appreciated. Cor: PMI nondisplaced. Regular rate & rhythm. No rubs, gallops or murmurs. Lungs: clear Abdomen: soft, nontender, nondistended. No hepatosplenomegaly. No bruits or masses. Good bowel sounds. Extremities: no cyanosis, clubbing, rash, edema  large ecchymosis LAC + fistula   Neuro: alert & orientedx3, cranial nerves grossly intact. moves all 4 extremities w/o difficulty.  Affect pleasant  Stable from cardiac perspective. Will plan coronary/graft angio tomorrow at lunch. I discussed with Dr. Stanford Breed and we will attempt to also do LE angio at same time.   Glori Bickers, MD  11:52 AM

## 2021-04-14 ENCOUNTER — Inpatient Hospital Stay (HOSPITAL_COMMUNITY)
Admission: EM | Disposition: A | Discharge status: Home Health | Payer: Self-pay | Source: Home / Self Care | Attending: Family Medicine

## 2021-04-14 DIAGNOSIS — I7092 Chronic total occlusion of artery of the extremities: Secondary | ICD-10-CM

## 2021-04-14 DIAGNOSIS — I739 Peripheral vascular disease, unspecified: Secondary | ICD-10-CM

## 2021-04-14 DIAGNOSIS — I70245 Atherosclerosis of native arteries of left leg with ulceration of other part of foot: Secondary | ICD-10-CM

## 2021-04-14 DIAGNOSIS — L97529 Non-pressure chronic ulcer of other part of left foot with unspecified severity: Secondary | ICD-10-CM

## 2021-04-14 HISTORY — PX: ABDOMINAL AORTOGRAM W/LOWER EXTREMITY: CATH118223

## 2021-04-14 HISTORY — PX: LEFT HEART CATH AND CORS/GRAFTS ANGIOGRAPHY: CATH118250

## 2021-04-14 LAB — BASIC METABOLIC PANEL
Anion gap: 12 (ref 5–15)
BUN: 50 mg/dL — ABNORMAL HIGH (ref 8–23)
CO2: 26 mmol/L (ref 22–32)
Calcium: 8.5 mg/dL — ABNORMAL LOW (ref 8.9–10.3)
Chloride: 98 mmol/L (ref 98–111)
Creatinine, Ser: 6.8 mg/dL — ABNORMAL HIGH (ref 0.61–1.24)
GFR, Estimated: 8 mL/min — ABNORMAL LOW (ref >60)
Glucose, Bld: 120 mg/dL — ABNORMAL HIGH (ref 70–99)
Potassium: 4.7 mmol/L (ref 3.5–5.1)
Sodium: 136 mmol/L (ref 135–145)

## 2021-04-14 LAB — RENAL FUNCTION PANEL
Albumin: 2.2 g/dL — ABNORMAL LOW (ref 3.5–5.0)
Anion gap: 13 (ref 5–15)
BUN: 24 mg/dL — ABNORMAL HIGH (ref 8–23)
CO2: 25 mmol/L (ref 22–32)
Calcium: 8.2 mg/dL — ABNORMAL LOW (ref 8.9–10.3)
Chloride: 97 mmol/L — ABNORMAL LOW (ref 98–111)
Creatinine, Ser: 4.49 mg/dL — ABNORMAL HIGH (ref 0.61–1.24)
GFR, Estimated: 14 mL/min — ABNORMAL LOW (ref >60)
Glucose, Bld: 114 mg/dL — ABNORMAL HIGH (ref 70–99)
Phosphorus: 3.7 mg/dL (ref 2.5–4.6)
Potassium: 3.9 mmol/L (ref 3.5–5.1)
Sodium: 135 mmol/L (ref 135–145)

## 2021-04-14 LAB — CBC
HCT: 28.8 % — ABNORMAL LOW (ref 39.0–52.0)
HCT: 29.9 % — ABNORMAL LOW (ref 39.0–52.0)
Hemoglobin: 9.1 g/dL — ABNORMAL LOW (ref 13.0–17.0)
Hemoglobin: 9.3 g/dL — ABNORMAL LOW (ref 13.0–17.0)
MCH: 31.2 pg (ref 26.0–34.0)
MCH: 30.5 pg (ref 26.0–34.0)
MCHC: 31.6 g/dL (ref 30.0–36.0)
MCHC: 31.1 g/dL (ref 30.0–36.0)
MCV: 98.6 fL (ref 80.0–100.0)
MCV: 98 fL (ref 80.0–100.0)
Platelets: 115 10*3/uL — ABNORMAL LOW (ref 150–400)
Platelets: 114 10*3/uL — ABNORMAL LOW (ref 150–400)
RBC: 2.92 MIL/uL — ABNORMAL LOW (ref 4.22–5.81)
RBC: 3.05 MIL/uL — ABNORMAL LOW (ref 4.22–5.81)
RDW: 15.1 % (ref 11.5–15.5)
RDW: 15.2 % (ref 11.5–15.5)
WBC: 6.3 10*3/uL (ref 4.0–10.5)
WBC: 6.3 10*3/uL (ref 4.0–10.5)
nRBC: 0 % (ref 0.0–0.2)
nRBC: 0 % (ref 0.0–0.2)

## 2021-04-14 LAB — GLUCOSE, CAPILLARY
Glucose-Capillary: 93 mg/dL (ref 70–99)
Glucose-Capillary: 157 mg/dL — ABNORMAL HIGH (ref 70–99)
Glucose-Capillary: 64 mg/dL — ABNORMAL LOW (ref 70–99)
Glucose-Capillary: 126 mg/dL — ABNORMAL HIGH (ref 70–99)
Glucose-Capillary: 67 mg/dL — ABNORMAL LOW (ref 70–99)
Glucose-Capillary: 70 mg/dL (ref 70–99)
Glucose-Capillary: 152 mg/dL — ABNORMAL HIGH (ref 70–99)

## 2021-04-14 LAB — CREATININE, SERUM
Creatinine, Ser: 4.56 mg/dL — ABNORMAL HIGH (ref 0.61–1.24)
GFR, Estimated: 14 mL/min — ABNORMAL LOW (ref >60)

## 2021-04-14 SURGERY — LEFT HEART CATH AND CORS/GRAFTS ANGIOGRAPHY
Anesthesia: LOCAL

## 2021-04-14 MED ORDER — SODIUM CHLORIDE 0.9% FLUSH
3.0000 mL | Freq: Two times a day (BID) | INTRAVENOUS | Status: DC
  Administered 2021-04-14 – 2021-04-19 (×9): 3 mL via INTRAVENOUS

## 2021-04-14 MED ORDER — ACETAMINOPHEN 325 MG PO TABS
650.0000 mg | ORAL_TABLET | ORAL | Status: DC | PRN
  Administered 2021-04-15 – 2021-04-16 (×3): 650 mg via ORAL
  Filled 2021-04-14: qty 2

## 2021-04-14 MED ORDER — DEXTROSE 50 % IV SOLN: 1.0000 | Freq: Once | INTRAVENOUS | Status: AC

## 2021-04-14 MED ORDER — LABETALOL HCL 5 MG/ML IV SOLN: 10.0000 mg | INTRAVENOUS | Status: DC | PRN

## 2021-04-14 MED ORDER — DEXTROSE 50 % IV SOLN
INTRAVENOUS | Status: AC
  Administered 2021-04-14: 50 mL via INTRAVENOUS
  Filled 2021-04-14: qty 50

## 2021-04-14 MED ORDER — SODIUM CHLORIDE 0.9 % IV SOLN: INTRAVENOUS | Status: DC

## 2021-04-14 MED ORDER — SODIUM CHLORIDE 0.9% FLUSH
3.0000 mL | Freq: Two times a day (BID) | INTRAVENOUS | Status: DC
  Administered 2021-04-14: 3 mL via INTRAVENOUS

## 2021-04-14 MED ORDER — HYDRALAZINE HCL 20 MG/ML IJ SOLN: 10.0000 mg | INTRAMUSCULAR | Status: AC | PRN

## 2021-04-14 MED ORDER — MIDAZOLAM HCL 2 MG/2ML IJ SOLN
INTRAMUSCULAR | Status: AC
  Filled 2021-04-14: qty 2

## 2021-04-14 MED ORDER — ASPIRIN 81 MG PO CHEW
81.0000 mg | CHEWABLE_TABLET | ORAL | Status: AC
  Administered 2021-04-14: 81 mg via ORAL
  Filled 2021-04-14: qty 1

## 2021-04-14 MED ORDER — LABETALOL HCL 5 MG/ML IV SOLN: 10.0000 mg | INTRAVENOUS | Status: AC | PRN

## 2021-04-14 MED ORDER — HEPARIN (PORCINE) IN NACL 1000-0.9 UT/500ML-% IV SOLN
INTRAVENOUS | Status: DC | PRN
  Administered 2021-04-14 (×4): 500 mL

## 2021-04-14 MED ORDER — HYDRALAZINE HCL 20 MG/ML IJ SOLN: 5.0000 mg | INTRAMUSCULAR | Status: DC | PRN

## 2021-04-14 MED ORDER — IOHEXOL 350 MG/ML SOLN
INTRAVENOUS | Status: DC | PRN
  Administered 2021-04-14: 35 mL

## 2021-04-14 MED ORDER — IODIXANOL 320 MG/ML IV SOLN
INTRAVENOUS | Status: DC | PRN
  Administered 2021-04-14: 250 mL

## 2021-04-14 MED ORDER — ONDANSETRON HCL 4 MG/2ML IJ SOLN: 4.0000 mg | Freq: Four times a day (QID) | INTRAMUSCULAR | Status: DC | PRN

## 2021-04-14 MED ORDER — VANCOMYCIN HCL 500 MG/100ML IV SOLN
500.0000 mg | Freq: Once | INTRAVENOUS | Status: AC
  Administered 2021-04-14: 500 mg via INTRAVENOUS
  Filled 2021-04-14: qty 100

## 2021-04-14 MED ORDER — ACETAMINOPHEN 325 MG PO TABS
650.0000 mg | ORAL_TABLET | ORAL | Status: DC | PRN
  Administered 2021-04-15 – 2021-04-20 (×5): 650 mg via ORAL
  Filled 2021-04-14 (×7): qty 2

## 2021-04-14 MED ORDER — SODIUM CHLORIDE 0.9 % IV SOLN: 250.0000 mL | INTRAVENOUS | Status: DC | PRN

## 2021-04-14 MED ORDER — SODIUM CHLORIDE 0.9 % IV SOLN
250.0000 mL | INTRAVENOUS | Status: DC | PRN
  Administered 2021-04-18: 250 mL via INTRAVENOUS

## 2021-04-14 MED ORDER — SODIUM CHLORIDE 0.9% FLUSH: 3.0000 mL | INTRAVENOUS | Status: DC | PRN

## 2021-04-14 MED ORDER — LIDOCAINE HCL (PF) 1 % IJ SOLN
INTRAMUSCULAR | Status: DC | PRN
  Administered 2021-04-14: 15 mL

## 2021-04-14 MED ORDER — HEPARIN SODIUM (PORCINE) 5000 UNIT/ML IJ SOLN
5000.0000 [IU] | Freq: Three times a day (TID) | INTRAMUSCULAR | Status: DC
  Administered 2021-04-14 – 2021-04-20 (×17): 5000 [IU] via SUBCUTANEOUS
  Filled 2021-04-14 (×17): qty 1

## 2021-04-14 MED ORDER — SODIUM CHLORIDE 0.9% FLUSH
3.0000 mL | Freq: Two times a day (BID) | INTRAVENOUS | Status: DC
  Administered 2021-04-14 – 2021-04-19 (×7): 3 mL via INTRAVENOUS

## 2021-04-14 MED ORDER — HEPARIN (PORCINE) IN NACL 1000-0.9 UT/500ML-% IV SOLN
INTRAVENOUS | Status: AC
  Filled 2021-04-14: qty 1000

## 2021-04-14 MED ORDER — LIDOCAINE HCL (PF) 1 % IJ SOLN
INTRAMUSCULAR | Status: AC
  Filled 2021-04-14: qty 30

## 2021-04-14 SURGICAL SUPPLY — 17 items
CATH ANGIO 5F PIGTAIL 65CM (CATHETERS) ×1 IMPLANT
CATH CROSS OVER TEMPO 5F (CATHETERS) ×1 IMPLANT
CATH INFINITI 5 FR IM (CATHETERS) ×1 IMPLANT
CATH INFINITI 5FR MULTPACK ANG (CATHETERS) ×1 IMPLANT
CATH NAVICROSS ANG 65CM (CATHETERS) IMPLANT
CATHETER NAVICROSS ANG 65CM (CATHETERS) ×3
GUIDEWIRE ANGLED .035X150CM (WIRE) ×1 IMPLANT
GUIDEWIRE INQWIRE 1.5J.035X260 (WIRE) IMPLANT
INQWIRE 1.5J .035X260CM (WIRE) ×3
KIT PV (KITS) ×4 IMPLANT
SHEATH PINNACLE 5F 10CM (SHEATH) ×1 IMPLANT
SYR MEDRAD MARK 7 150ML (SYRINGE) ×6 IMPLANT
TRANSDUCER W/STOPCOCK (MISCELLANEOUS) ×6 IMPLANT
TRAY PV CATH (CUSTOM PROCEDURE TRAY) ×4 IMPLANT
TUBING CIL FLEX 10 FLL-RA (TUBING) ×3 IMPLANT
WIRE HITORQ VERSACORE ST 145CM (WIRE) ×1 IMPLANT
WIRE J 3MM .035X145CM (WIRE) ×1 IMPLANT

## 2021-04-14 NOTE — Procedures (Signed)
I was present at this dialysis session. I have reviewed the session itself and made appropriate changes.   Vital signs in last 24 hours:  Temp:  [97.9 F (36.6 C)-98.6 F (37 C)] 98.5 F (36.9 C) (06/10 0742) Pulse Rate:  [81-88] 87 (06/10 0900) Resp:  [17-29] 19 (06/10 0900) BP: (105-169)/(48-99) 141/75 (06/10 0900) SpO2:  [90 %-100 %] 99 % (06/10 0830) Weight:  [92 kg-92.3 kg] 92.3 kg (06/10 0742) Weight change: -0.2 kg Filed Weights   04/13/21 0437 04/14/21 0500 04/14/21 0742  Weight: 91.9 kg 92 kg 92.3 kg    Recent Labs  Lab 04/12/21 0224 04/13/21 0257 04/14/21 0340  NA 132*   < > 136  K 4.3   < > 4.7  CL 95*   < > 98  CO2 25   < > 26  GLUCOSE 336*   < > 120*  BUN 47*   < > 50*  CREATININE 6.22*   < > 6.80*  CALCIUM 8.2*   < > 8.5*  PHOS 6.1*  --   --    < > = values in this interval not displayed.    Recent Labs  Lab 04/12/21 0224 04/13/21 0257 04/14/21 0340  WBC 4.1 4.0 6.3  HGB 9.3* 8.8* 9.1*  HCT 30.6* 28.2* 28.8*  MCV 100.0 98.6 98.6  PLT 109* 108* 115*    Scheduled Meds:  atorvastatin  80 mg Oral Daily   Chlorhexidine Gluconate Cloth  6 each Topical Q0600   escitalopram  10 mg Oral Daily   gentamicin cream  1 application Topical Daily   dianeal solution for CAPD/CCPD with heparin   Peritoneal Dialysis Q24H   heparin  5,000 Units Subcutaneous Q8H   insulin aspart  0-6 Units Subcutaneous TID WC   insulin glargine  15 Units Subcutaneous QHS   pantoprazole  40 mg Oral Q supper   sodium chloride flush  3 mL Intravenous Q12H   vancomycin variable dose per unstable renal function (pharmacist dosing)   Does not apply See admin instructions   Continuous Infusions:  sodium chloride 250 mL (04/13/21 2145)   sodium chloride     sodium chloride Stopped (04/14/21 0623)   piperacillin-tazobactam (ZOSYN)  IV 100 mL/hr at 04/14/21 0640   PRN Meds:.sodium chloride, clonazepam, docusate sodium, ondansetron (ZOFRAN) IV, polyethylene glycol, sodium chloride,  sodium chloride flush     Assessment/Plan: Dialysis Orders: Center: [facility] [place]  on CCPD with 3 exchanges, 2700 ml fill volume, fill time 30 minutes, drain time 30 minutes, dwell time 2 hours.   NEPHROLOGIST: Claris Gladden  LOCATION: [place]  SCHEDULE: M-W-F 2nd Shift  EDW: TBD kg.  KIDNEY:  LITERS PROC: liters/treatment  HD TIME: 4 hrs. 0 min.  ACCESS: L Upper Arm AVF  NEEDLE SIZE: 15 ga.  ANTICOAG: Standard Heparin Per Protocol  BATH: 2.0 K-2.5Ca 35 HCO3  QB: 500 ml/min  QD: Auto Flow Protocol     Assessment/Plan: NSTEMI - Cardiology consulted and has EKG changes.  ECHO with significant decrease in EF from April 2022.  For [facility] tomorrow afternoon. Acute systolic CHF - EF 42-68% (down from 55% in April).  Started on levophed yesterday and tolerated 2 liters UF with HD 04/10/21.  No uF with CCPD but tolerated 2.4 liters with HD yesterday.  Still with some edema so will plan HD with UF today prior to [facility]  Heart failure team consulted.    ESRD -  Normally does CCPD at home but  had issues with machine and draining so had sporadic treatments and was set up for IHD day he presented to [facility] ED.  He tolerated HD well last night with UF of 2 liters.   Transitioned to IHD due to inadequate drainage/UF with CCPD (likely catheter malfunction). Plan for HD today with UF prior to [facility] which is scheduled at 12:30 pm Will need to have PD catheter evaluated as an outpatient.     Given miralax daily to help with function of PD cath and cont with weekly flushes to keep it patent. Will continue with IHD for now and will need to f/u with his primary nephrologist in [place] to decide about resuming CCPD or continue with IHD  Hypertension/volume  - volume overload and plan for HD with UF as above.  ECHO with drop in EF started on levophed with improved BP.  Currently off levophed.  Anemia  - due to ESRD.  Will dose with ESA and transfuse prn.  Metabolic bone disease  -  Continue with home meds  Nutrition -  Renal diet, carb modified Vascular access:  LUE AVF +T/B Diabetic heel/foot ulcer - Vascular surgery consulted as well as Dr. Sharol Given.  Currently on vanc/zosyn.  For angiogram today with LHC by Dr. Stanford Breed. Hypokalemia - will replete with IV KCl x 2 runs and follow Hypomagnesemia - repleted with 2 grams IV and follow.   Donetta Potts,  MD 04/14/2021, 9:09 AM

## 2021-04-14 NOTE — Interval H&P Note (Signed)
History and Physical Interval Note:  04/14/2021 3:30 PM  Maxwell Harmon  has presented today for surgery, with the diagnosis of chest pain.  The various methods of treatment have been discussed with the patient and family. After consideration of risks, benefits and other options for treatment, the patient has consented to  Procedure(s): LEFT HEART CATH AND CORS/GRAFTS ANGIOGRAPHY (N/A) ABDOMINAL AORTOGRAM W/LOWER EXTREMITY (N/A) as a surgical intervention.  The patient's history has been reviewed, patient examined, no change in status, stable for surgery.  I have reviewed the patient's chart and labs.  Questions were answered to the patient's satisfaction.     Ruta Hinds

## 2021-04-14 NOTE — Progress Notes (Signed)
Pharmacy Antibiotic Note  Maxwell Harmon is a 65 y.o. male admitted on 04/10/2021 with sepsis.  Pharmacy has been consulted for Zosyn and vancomycin dosing.  ESRD on peritoneal dialysis at home but recently underwent HD given problems with peritoneal dialysis at home. No UF on PD Tues night - had HD on Wed and today. LHC and angio this afternoon.  Expect pre-HD level to be ~18, goal 15-25 mcg/ml.  Plan: -Vancomycin 500 mg IV post HD today with expected level ~24 mcg/ml -Continue Zosyn 2.25 g IV q8h -Monitor CBC, renal fx, cultures and clinical progress  Height: 5\' 11"  (180.3 cm) Weight: 91.3 kg (201 lb 4.5 oz) IBW/kg (Calculated) : 75.3  Temp (24hrs), Avg:98 F (36.7 C), Min:97.3 F (36.3 C), Max:98.6 F (37 C)  Recent Labs  Lab 04/10/21 1819 04/10/21 2118 04/10/21 2133 04/10/21 2224 04/11/21 0114 04/12/21 0224 04/12/21 1206 04/13/21 0257 04/14/21 0340  WBC  --   --  4.4  --  3.8* 4.1  --  4.0 6.3  CREATININE  --  10.25*  --   --  4.44* 6.22*  --  5.33* 6.80*  LATICACIDVEN 1.0  --   --  0.9  --   --   --   --   --   VANCORANDOM  --   --   --   --   --   --  29  --   --      Estimated Creatinine Clearance: 12.7 mL/min (A) (by C-G formula based on SCr of 6.8 mg/dL (H)).    No Known Allergies  Antimicrobials this admission: Zosyn 6/6 >>  Vancomycin 6/6 >>   Dose adjustments this admission: 6/8 VR 29  Microbiology results: 6/6 BCx: ngtd  Thank you for involving pharmacy in this patient's care.  Renold Genta, PharmD, BCPS Clinical Pharmacist Clinical phone for 04/14/2021 until 3p is [phone] 04/14/2021 2:29 PM  **Pharmacist phone directory can be found on [email or web address] listed under [place]**

## 2021-04-14 NOTE — Op Note (Signed)
Procedure: Abdominal aortogram with bilateral lower extremity runoff  Preoperative diagnosis: Nonhealing wound left foot  Postoperative diagnosis: Same  Anesthesia: Local with IV sedation  Operative findings: 1.  Occlusion left popliteal artery with reconstitution of the below-knee popliteal artery and one-vessel posterior tibial runoff severe vessel calcification diffusely  2.  Occlusion right popliteal artery with reconstitution of the posterior tibial artery one-vessel runoff to the right foot  Operative details: After obtaining informed consent, the patient was taken the [place] lab.  The patient was placed in supine position on the angio table.  Both groins were prepped and draped in usual sterile fashion.  Local anesthesia was infiltrated over the right common femoral artery.  Ultrasound was used to identify the right common femoral artery and femoral bifurcation.  An introducer needle was used to cannulate the right common femoral artery and an 035 versa core wire threaded in the abdominal aorta under fluoroscopic guidance.  A 5 French sheath was then placed over the guidewire and this was thoroughly flushed heparinized saline.  A 5 French pigtail catheter was then advanced over the guidewire to the abdominal aorta.  Abdominal aortogram was obtained in AP projection.  Left renal artery is patent.  Portions of the right renal artery that were visualized were diseased but patent.  At this was not fully visualized since the patient is end-stage renal disease.  The infrarenal abdominal aorta is calcified throughout its wall as well as the left and right external common and internal iliac arteries but they are patent.  Next bilateral lower extremity runoff views were obtained through the pigtail catheter.  In the left lower extremity the left common femoral artery profunda femoris artery and superficial femoral arteries are patent.  There again heavily calcified.  There was poor visualization of the  vessels distally.  Therefore the pigtail catheter was removed over guidewire and swapped out for a 5 [place] crossover catheter.  An 035 angled Glidewire was then advanced through this all the way down to the mid left superficial femoral artery.  Due to the steep aortic bifurcation an 035 Nava cross was used to go up and over the aortic bifurcation and this was advanced into the distal left superficial femoral artery.  Left lower extremity arteriogram was then obtained.  Left popliteal artery is occluded with diffuse calcific plaque extending from above the knee across the knee joint to the below-knee popliteal artery.  The more distal below-knee popliteal artery is patent without significant stenosis but is definitely calcified in the wall.  The anterior tibial artery and peroneal arteries are occluded.  The posterior tibial artery is the sole runoff vessel to the right foot.  Again this is diffusely calcified but it is patent all the way into the foot.  At this point the Ferd Hibbs cross was removed and additional views of the right lower extremity were obtained through the sheath in the right groin.  The right common femoral profunda femoris is patent.  The right superficial femoral artery is patent.  The right popliteal artery is occluded essentially throughout its course.  The intertibial artery is occluded.  The peroneal artery is occluded.  The posterior tibial artery does reconstitute via collaterals and the sole runoff vessel to the right foot.  It is heavily calcified.  At this point Dr. Haroldine Laws proceeded with the cardiac catheterization portion of the case and this is dictated as a separate operative note.  Operative management: Patient has fairly heavily calcified arteries but best operation for improved perfusion  of his foot is most likely going to be above-knee to below-knee popliteal bypass if his vessels are deemed to heavily calcified for grafting or if he is a high cardiac risk and potentially he  would be a candidate for atherectomy of his popliteal artery plus minus stenting.  However, I think this would have a less durable result.  Ruta Hinds, MD [facility] Office: [phone]

## 2021-04-14 NOTE — Interval H&P Note (Signed)
History and Physical Interval Note:  04/14/2021 1:51 PM  Maxwell Harmon  has presented today for surgery, with the diagnosis of HF/CAD  The various methods of treatment have been discussed with the patient and family. After consideration of risks, benefits and other options for treatment, the patient has consented to  Procedure(s): LEFT HEART CATH AND CORS/GRAFTS ANGIOGRAPHY (N/A) ABDOMINAL AORTOGRAM W/LOWER EXTREMITY (N/A) possible coronary angioplasty as a surgical intervention.  The patient's history has been reviewed, patient examined, no change in status, stable for surgery.  I have reviewed the patient's chart and labs.  Questions were answered to the patient's satisfaction.     Florie Carico

## 2021-04-14 NOTE — Plan of Care (Signed)
  Problem: Education: Goal: Knowledge of General Education information will improve Description: Including pain rating scale, medication(s)/side effects and non-pharmacologic comfort measures Outcome: Progressing   Problem: Health Behavior/Discharge Planning: Goal: Ability to manage health-related needs will improve Outcome: Progressing   Problem: Clinical Measurements: Goal: Respiratory complications will improve Outcome: Progressing   

## 2021-04-14 NOTE — Progress Notes (Signed)
[facility]  PROGRESS NOTE  Maxwell Harmon HQP:[id number] DOB: 1956-02-18 DOA: 04/10/2021 PCP: [name], DO   Brief HPI:   65 year old male with history of CAD s/p CABG in 2021, preserved EF, Charcot foot, ESRD on peritoneal dialysis presented with cellulitis of left foot/diabetic foot wound.  Patient was admitted to ICU started on broad-spectrum antibiotic for diabetic foot wound.  Nephrology was consulted and patient was switched to hemodialysis due to PD catheter issues. Patient significantly improved so he was transferred out of ICU    Subjective   Patient seen during hemodialysis.  Denies any complaints.  Plan for left heart catheterization and aortogram today.   Assessment/Plan:     Sepsis due to left lower extremity cellulitis -Sepsis physiology has resolved -Continue vancomycin and Zosyn -Blood cultures x2 are negative to date   Metabolic encephalopathy -Resolved -Likely from above   Left diabetic foot wound/PAD -Dr. Sharol Given was consulted -Vascular surgery has seen the patient; plan to take for aortogram today. -X-ray of left foot tibia and fibula was negative for osteomyelitis   ESRD -Patient has been on peritoneal dialysis at home -Switched to IHD  -Nephrology following   Acute systolic heart failure -Echocardiogram during this admission showed EF of 30 to 35% -Stress echo from April 2022 showed EF of 55% -Patient currently on HD  -Patient has history of CABG ; cardiology planning to take for left heart cath today.   Diabetes mellitus type 2 -Continue Lantus 15 units subcu daily -Continue very sensitive sliding scale insulin -Blood glucose is labile; we will continue to monitor blood glucose and adjust insulin as needed    Scheduled medications:    [MAR Hold] atorvastatin  80 mg Oral Daily   [MAR Hold] Chlorhexidine Gluconate Cloth  6 each Topical Q0600   [MAR Hold] escitalopram  10 mg Oral Daily   [MAR Hold] gentamicin cream  1  application Topical Daily   [MAR Hold] dianeal solution for CAPD/CCPD with heparin   Peritoneal Dialysis Q24H   [MAR Hold] heparin  5,000 Units Subcutaneous Q8H   [MAR Hold] insulin aspart  0-6 Units Subcutaneous TID WC   [MAR Hold] insulin glargine  15 Units Subcutaneous QHS   [MAR Hold] pantoprazole  40 mg Oral Q supper   sodium chloride flush  3 mL Intravenous Q12H   [MAR Hold] vancomycin variable dose per unstable renal function (pharmacist dosing)   Does not apply See admin instructions         Data Reviewed:   CBG:  Recent Labs  Lab 04/13/21 2115 04/14/21 0607 04/14/21 1156 04/14/21 1224 04/14/21 1231  GLUCAP 187* 93 64* 157* 126*    SpO2: (!) 89 % O2 Flow Rate (L/min): 2 L/min    Vitals:   04/14/21 1050 04/14/21 1130 04/14/21 1154 04/14/21 1532  BP:  (!) 147/79 (!) 161/75   Pulse: (!) 102 87 87   Resp: (!) 22 18 18    Temp:  (!) 97.3 F (36.3 C) (!) 97.5 F (36.4 C)   TempSrc:  Oral Oral   SpO2: 100% 100% 100% (!) 89%  Weight:  91.3 kg    Height:         Intake/Output Summary (Last 24 hours) at 04/14/2021 1620 Last data filed at 04/14/2021 1333 Gross per 24 hour  Intake 812.43 ml  Output 2000 ml  Net -1187.57 ml    06/08 1901 - 06/10 0700 In: 1412.4 [P.O.:1080; I.V.:5.8] Out: 0   Filed Weights   04/14/21 0500 04/14/21 5993  04/14/21 1130  Weight: 92 kg 92.3 kg 91.3 kg    CBC:  Recent Labs  Lab 04/10/21 2133 04/11/21 0114 04/12/21 0224 04/13/21 0257 04/14/21 0340  WBC 4.4 3.8* 4.1 4.0 6.3  HGB 8.7* 8.7* 9.3* 8.8* 9.1*  HCT 28.2* 26.9* 30.6* 28.2* 28.8*  PLT 113* 99* 109* 108* 115*  MCV 98.9 96.1 100.0 98.6 98.6  MCH 30.5 31.1 30.4 30.8 31.2  MCHC 30.9 32.3 30.4 31.2 31.6  RDW 15.0 15.0 15.3 15.3 15.1    Complete metabolic panel:  Recent Labs  Lab 04/10/21 1033 04/10/21 1640 04/10/21 1725 04/10/21 1819 04/10/21 2118 04/10/21 2131 04/10/21 2132 04/10/21 2224 04/11/21 0114 04/12/21 0224 04/13/21 0257 04/14/21 0340  NA  136  --   --   --  136  --   --   --  135 132* 134* 136  K 4.5  --   --   --  4.3  --   --   --  3.0* 4.3 3.9 4.7  CL 98  --   --   --  97*  --   --   --  98 95* 97* 98  CO2 23  --   --   --  25  --   --   --  26 25 27 26   GLUCOSE 106*  --   --   --  57*  --   --   --  101* 336* 180* 120*  BUN 82*  --   --   --  87*  --   --   --  33* 47* 33* 50*  CREATININE 9.76*  --   --   --  10.25*  --   --   --  4.44* 6.22* 5.33* 6.80*  CALCIUM 8.7*  --   --   --  8.1*  --   --   --  8.2* 8.2* 8.3* 8.5*  AST  --   --  23  --   --  29  --  23  --   --   --   --   ALT  --   --  15  --   --  14  --  15  --   --   --   --   ALKPHOS  --   --  86  --   --  79  --  85  --   --   --   --   BILITOT  --   --  0.7  --   --  1.0  --  1.0  --   --   --   --   ALBUMIN  --   --  2.3*  --  2.4* 2.3*  --  2.2*  --  2.4*  --   --   MG  --   --   --   --   --   --   --   --  1.7 2.1  --   --   DDIMER  --   --   --   --   --   --  2.86*  --   --   --   --   --   LATICACIDVEN  --   --   --  1.0  --   --   --  0.9  --   --   --   --   INR  --   --   --   --   --   --  1.2  --   --   --   --   --   HGBA1C  --  8.2*  --   --   --   --   --   --   --   --   --   --   BNP 4,239.3*  --   --   --   --   --   --   --   --   --   --   --     Recent Labs  Lab 04/10/21 2131  LIPASE 25  AMYLASE 27*    Recent Labs  Lab 04/10/21 1033 04/10/21 1150 04/10/21 2132  DDIMER  --   --  2.86*  BNP 4,239.3*  --   --   SARSCOV2NAA  --  NEGATIVE  --     ------------------------------------------------------------------------------------------------------------------ No results for input(s): CHOL, HDL, LDLCALC, TRIG, CHOLHDL, LDLDIRECT in the last 72 hours.   Lab Results  Component Value Date   HGBA1C 8.2 (H) 04/10/2021   ------------------------------------------------------------------------------------------------------------------ No results for input(s): TSH, T4TOTAL, T3FREE, THYROIDAB in the last 72 hours.  Invalid  input(s): FREET3 ------------------------------------------------------------------------------------------------------------------ No results for input(s): VITAMINB12, FOLATE, FERRITIN, TIBC, IRON, RETICCTPCT in the last 72 hours.  Coagulation profile Recent Labs  Lab 04/10/21 2132  INR 1.2   No results for input(s): DDIMER in the last 72 hours.   Cardiac Enzymes No results for input(s): CKTOTAL, CKMB, CKMBINDEX, TROPONINI in the last 168 hours.  ------------------------------------------------------------------------------------------------------------------    Component Value Date/Time   BNP 4,239.3 (H) 04/10/2021 1033     Antibiotics: Anti-infectives (From admission, onward)    Start     Dose/Rate Route Frequency Ordered Stop   04/14/21 1515  [MAR Hold]  vancomycin (VANCOREADY) IVPB 500 mg/100 mL        (MAR Hold since Fri 04/14/2021 at 1517.Hold Reason: Transfer to a Procedural area)   500 mg 100 mL/hr over 60 Minutes Intravenous  Once 04/14/21 1428     04/12/21 2000  vancomycin (VANCOREADY) IVPB 750 mg/150 mL        750 mg 150 mL/hr over 60 Minutes Intravenous NOW 04/12/21 1852 04/12/21 2211   04/11/21 1030  vancomycin (VANCOREADY) IVPB 1500 mg/300 mL        1,500 mg 150 mL/hr over 120 Minutes Intravenous  Once 04/11/21 0937 04/11/21 1329   04/11/21 0400  [MAR Hold]  piperacillin-tazobactam (ZOSYN) IVPB 2.25 g        (MAR Hold since Fri 04/14/2021 at 1517.Hold Reason: Transfer to a Procedural area)   2.25 g 100 mL/hr over 30 Minutes Intravenous Every 8 hours 04/10/21 1951     04/10/21 1951  [MAR Hold]  vancomycin variable dose per unstable renal function (pharmacist dosing)        (MAR Hold since Fri 04/14/2021 at 1517.Hold Reason: Transfer to a Procedural area)    Does not apply See admin instructions 04/10/21 1951     04/10/21 1900  vancomycin (VANCOREADY) IVPB 1750 mg/350 mL        1,750 mg 175 mL/hr over 120 Minutes Intravenous  Once 04/10/21 1836 04/10/21 2322    04/10/21 1845  piperacillin-tazobactam (ZOSYN) IVPB 3.375 g        3.375 g 100 mL/hr over 30 Minutes Intravenous  Once 04/10/21 1836 04/10/21 2121        Radiology Reports  No results found.     DVT prophylaxis: Heparin  Code Status: Full code  Family Communication: No family at bedside  Consultants: Cardiology Vascular surgery PCCM  Procedures:     Objective    Physical Examination:  General-appears in no acute distress Heart-S1-S2, regular, no murmur auscultated Lungs-clear to auscultation bilaterally, no wheezing or crackles auscultated Abdomen-soft, nontender, no organomegaly Extremities-no edema in the lower extremities Neuro-alert, oriented x3, no focal deficit noted   Status is: Inpatient  Dispo: The patient is from: Home              Anticipated d/c is to: Home              Anticipated d/c date is: 04/16/2021              Patient currently not stable for discharge  Barrier to discharge-awaiting heart catheterization  COVID-19 Labs  No results for input(s): DDIMER, FERRITIN, LDH, CRP in the last 72 hours.   Lab Results  Component Value Date   [place] NEGATIVE 04/10/2021    Microbiology  Recent Results (from the past 240 hour(s))  Resp Panel by RT-PCR (Flu A&B, Covid) Nasopharyngeal Swab     Status: None   Collection Time: 04/10/21 11:50 AM   Specimen: Nasopharyngeal Swab; Nasopharyngeal(NP) swabs in vial transport medium  Result Value Ref Range Status   SARS Coronavirus 2 by RT PCR NEGATIVE NEGATIVE Final    Comment: (NOTE) SARS-CoV-2 target nucleic acids are NOT DETECTED.  The SARS-CoV-2 RNA is generally detectable in upper respiratory specimens during the acute phase of infection. The lowest concentration of SARS-CoV-2 viral copies this assay can detect is 138 copies/mL. A negative result does not preclude SARS-Cov-2 infection and should not be used as the sole basis for treatment or other patient management decisions. A  negative result may occur with  improper specimen collection/handling, submission of specimen other than nasopharyngeal swab, presence of viral mutation(s) within the areas targeted by this assay, and inadequate number of viral copies(<138 copies/mL). A negative result must be combined with clinical observations, patient history, and epidemiological information. The expected result is Negative.  Fact Sheet for Patients:  EntrepreneurPulse.com.au  Fact Sheet for Healthcare Providers:  IncredibleEmployment.be  This test is no t yet approved or cleared by the [place] FDA and  has been authorized for detection and/or diagnosis of SARS-CoV-2 by FDA under an Emergency Use Authorization (EUA). This EUA will remain  in effect (meaning this test can be used) for the duration of the COVID-19 declaration under Section 564(b)(1) of the Act, 21 U.S.C.section 360bbb-3(b)(1), unless the authorization is terminated  or revoked sooner.       Influenza A by PCR NEGATIVE NEGATIVE Final   Influenza B by PCR NEGATIVE NEGATIVE Final    Comment: (NOTE) The Xpert Xpress SARS-CoV-2/FLU/RSV plus assay is intended as an aid in the diagnosis of influenza from Nasopharyngeal swab specimens and should not be used as a sole basis for treatment. Nasal washings and aspirates are unacceptable for Xpert Xpress SARS-CoV-2/FLU/RSV testing.  Fact Sheet for Patients: EntrepreneurPulse.com.au  Fact Sheet for Healthcare Providers: IncredibleEmployment.be  This test is not yet approved or cleared by the [place] FDA and has been authorized for detection and/or diagnosis of SARS-CoV-2 by FDA under an Emergency Use Authorization (EUA). This EUA will remain in effect (meaning this test can be used) for the duration of the COVID-19 declaration under Section 564(b)(1) of the Act, 21 U.S.C. section 360bbb-3(b)(1), unless the authorization  is terminated or revoked.  Performed at [facility], [place] [address] [place], [place] [address]   Culture, blood (routine x  2)     Status: None (Preliminary result)   Collection Time: 04/10/21 10:24 PM   Specimen: BLOOD  Result Value Ref Range Status   Specimen Description BLOOD SITE NOT SPECIFIED  Final   Special Requests   Final    BOTTLES DRAWN AEROBIC AND ANAEROBIC Blood Culture adequate volume   Culture   Final    NO GROWTH 3 DAYS Performed at [facility], [address], [place], [place] [address]    Report Status PENDING  Incomplete  Culture, blood (routine x 2)     Status: None (Preliminary result)   Collection Time: 04/10/21 10:24 PM   Specimen: BLOOD  Result Value Ref Range Status   Specimen Description BLOOD SITE NOT SPECIFIED  Final   Special Requests   Final    BOTTLES DRAWN AEROBIC AND ANAEROBIC Blood Culture adequate volume   Culture   Final    NO GROWTH 3 DAYS Performed at [facility], [address] [place], [place] [address]    Report Status PENDING  Incomplete  MRSA PCR Screening     Status: None   Collection Time: 04/11/21 10:16 AM   Specimen: Nasal Mucosa; Nasopharyngeal  Result Value Ref Range Status   MRSA by PCR NEGATIVE NEGATIVE Final    Comment:        The GeneXpert MRSA Assay (FDA approved for NASAL specimens only), is one component of a comprehensive MRSA colonization surveillance program. It is not intended to diagnose MRSA infection nor to guide or monitor treatment for MRSA infections. Performed at [facility], [place] [address] [place], [place] [address]     Pressure Injury 04/10/21 Heel Left;Lateral Stage 3 -  Full thickness tissue loss. Subcutaneous fat may be visible but bone, tendon or muscle are NOT exposed. (Active)  04/10/21 2200  Location: Heel  Location Orientation: Left;Lateral  Staging: Stage 3 -  Full thickness tissue loss. Subcutaneous fat may be visible but bone, tendon or muscle are NOT  exposed.  Wound Description (Comments):   Present on Admission: Yes     Pressure Injury 04/10/21 Toe (Comment  which one) Anterior;Left Stage 2 -  Partial thickness loss of dermis presenting as a shallow open injury with a red, pink wound bed without slough. (Active)  04/10/21 2200  Location: Toe (Comment  which one) (dorsal great toe)  Location Orientation: Anterior;Left  Staging: Stage 2 -  Partial thickness loss of dermis presenting as a shallow open injury with a red, pink wound bed without slough.  Wound Description (Comments):   Present on Admission: Yes          [place]   [facility] If 7PM-7AM, please contact night-coverage at [email or web address], Office  [phone]   04/14/2021, 4:20 PM  LOS: 4 days

## 2021-04-14 NOTE — Progress Notes (Signed)
Advanced Heart Failure Rounding Note  PCP-Cardiologist: None   Subjective:    Denies CP or SOB. Had HD today  Objective:   Weight Range: 91.3 kg Body mass index is 28.07 kg/m.   Vital Signs:   Temp:  [97.3 F (36.3 C)-98.6 F (37 C)] 97.5 F (36.4 C) (06/10 1154) Pulse Rate:  [80-102] 87 (06/10 1154) Resp:  [17-29] 18 (06/10 1154) BP: (105-169)/(48-99) 161/75 (06/10 1154) SpO2:  [90 %-100 %] 100 % (06/10 1154) Weight:  [91.3 kg-92.3 kg] 91.3 kg (06/10 1130) Last BM Date: 04/13/21  Weight change: Filed Weights   04/14/21 0500 04/14/21 0742 04/14/21 1130  Weight: 92 kg 92.3 kg 91.3 kg    Intake/Output:   Intake/Output Summary (Last 24 hours) at 04/14/2021 1329 Last data filed at 04/14/2021 1050 Gross per 24 hour  Intake 812.43 ml  Output 2000 ml  Net -1187.57 ml       Physical Exam    General:  Sitting up in bed No resp difficulty HEENT: normal Neck: supple. no JVD. Carotids 2+ bilat; no bruits. No lymphadenopathy or thryomegaly appreciated. Cor: PMI nondisplaced. Regular rate & rhythm. No rubs, gallops or murmurs. Lungs: clear Abdomen: soft, nontender, nondistended. No hepatosplenomegaly. No bruits or masses. Good bowel sounds.  Extremities: no cyanosis, clubbing, rash, trace edema  LUE AVF/ecchymosis Neuro: alert & orientedx3, cranial nerves grossly intact. moves all 4 extremities w/o difficulty. Affect pleasant    Telemetry   SR 80-90 Personally reviewed  Labs    CBC Recent Labs    04/13/21 0257 04/14/21 0340  WBC 4.0 6.3  HGB 8.8* 9.1*  HCT 28.2* 28.8*  MCV 98.6 98.6  PLT 108* 115*    Basic Metabolic Panel Recent Labs    04/12/21 0224 04/13/21 0257 04/14/21 0340  NA 132* 134* 136  K 4.3 3.9 4.7  CL 95* 97* 98  CO2 25 27 26   GLUCOSE 336* 180* 120*  BUN 47* 33* 50*  CREATININE 6.22* 5.33* 6.80*  CALCIUM 8.2* 8.3* 8.5*  MG 2.1  --   --   PHOS 6.1*  --   --     Liver Function Tests Recent Labs    04/12/21 0224  ALBUMIN  2.4*    No results for input(s): LIPASE, AMYLASE in the last 72 hours.  Cardiac Enzymes No results for input(s): CKTOTAL, CKMB, CKMBINDEX, TROPONINI in the last 72 hours.  BNP: BNP (last 3 results) Recent Labs    04/10/21 1033  BNP 4,239.3*     ProBNP (last 3 results) No results for input(s): PROBNP in the last 8760 hours.   D-Dimer No results for input(s): DDIMER in the last 72 hours.  Hemoglobin A1C No results for input(s): HGBA1C in the last 72 hours.  Fasting Lipid Panel No results for input(s): CHOL, HDL, LDLCALC, TRIG, CHOLHDL, LDLDIRECT in the last 72 hours.  Thyroid Function Tests No results for input(s): TSH, T4TOTAL, T3FREE, THYROIDAB in the last 72 hours.  Invalid input(s): FREET3  Other results:   Imaging    No results found.   Medications:     Scheduled Medications:  atorvastatin  80 mg Oral Daily   Chlorhexidine Gluconate Cloth  6 each Topical Q0600   escitalopram  10 mg Oral Daily   gentamicin cream  1 application Topical Daily   dianeal solution for CAPD/CCPD with heparin   Peritoneal Dialysis Q24H   heparin  5,000 Units Subcutaneous Q8H   insulin aspart  0-6 Units Subcutaneous TID WC  insulin glargine  15 Units Subcutaneous QHS   pantoprazole  40 mg Oral Q supper   sodium chloride flush  3 mL Intravenous Q12H   vancomycin variable dose per unstable renal function (pharmacist dosing)   Does not apply See admin instructions    Infusions:  sodium chloride 250 mL (04/13/21 2145)   sodium chloride     sodium chloride Stopped (04/14/21 0623)   piperacillin-tazobactam (ZOSYN)  IV 100 mL/hr at 04/14/21 0640    PRN Medications: sodium chloride, clonazepam, docusate sodium, ondansetron (ZOFRAN) IV, polyethylene glycol, sodium chloride, sodium chloride flush    Patient Profile   Very complicated situation. 65 y/o male with multiple medical problems including DM2, ESRD, charcot joint with active wound, CAD with CABG in 2021 admitted  with altered mental status. Creatinine on admit 10. Switched from PD to HD. Found to have LLE wound with concern PAD/critical limb ischemia. Hs trop 1300   Echo done EF 30-35% (previous EF was 50%) pattern on echo suggestive of possible T[place]-T[place]o.   Assessment/Plan   AMS - suspect due to uremia - resolved with HD   2. ESRD -Has been seen at [facility] for possible [facility].  -Previously on PD but ineffective drain last week. Switched iHD - Nephrology following    3. CAD  - H/O CABG 2021  - No chest pain.  - HS Trop [id number]>[id number]. - For LHC/coronary angio this afternoon   4. Acute Systolic Heart Failure  - Stress ECHO 02/2021 EF ~55%.  - ECHO this admit EF 30-35%  - Fluid management via dialysis - HS Trop 5573>2202.  - No bb for now.  - No spiro /arni/dig with ESRD  - For cath today   5.Charcot right - Dr Sharol Given following. On vancomycin/zosyn.    6. PAD  -LLE concerning for ischemia.  -04/10/21 Vascular consulted. Plan for  Arteriogram to assess flow - hopefully will be able to do during cath today    Length of Stay: 4  Glori Bickers, MD  04/14/2021, 1:29 PM  Advanced Heart Failure Team Pager [phone] (M-F; 7a - 5p)  Please contact [facility] for night-coverage after hours (5p -7a ) and weekends on [email or web address]

## 2021-04-14 NOTE — H&P (View-Only) (Signed)
Advanced Heart Failure Rounding Note  PCP-Cardiologist: None   Subjective:    Denies CP or SOB. Had HD today  Objective:   Weight Range: 91.3 kg Body mass index is 28.07 kg/m.   Vital Signs:   Temp:  [97.3 F (36.3 C)-98.6 F (37 C)] 97.5 F (36.4 C) (06/10 1154) Pulse Rate:  [80-102] 87 (06/10 1154) Resp:  [17-29] 18 (06/10 1154) BP: (105-169)/(48-99) 161/75 (06/10 1154) SpO2:  [90 %-100 %] 100 % (06/10 1154) Weight:  [91.3 kg-92.3 kg] 91.3 kg (06/10 1130) Last BM Date: 04/13/21  Weight change: Filed Weights   04/14/21 0500 04/14/21 0742 04/14/21 1130  Weight: 92 kg 92.3 kg 91.3 kg    Intake/Output:   Intake/Output Summary (Last 24 hours) at 04/14/2021 1329 Last data filed at 04/14/2021 1050 Gross per 24 hour  Intake 812.43 ml  Output 2000 ml  Net -1187.57 ml       Physical Exam    General:  Sitting up in bed No resp difficulty HEENT: normal Neck: supple. no JVD. Carotids 2+ bilat; no bruits. No lymphadenopathy or thryomegaly appreciated. Cor: PMI nondisplaced. Regular rate & rhythm. No rubs, gallops or murmurs. Lungs: clear Abdomen: soft, nontender, nondistended. No hepatosplenomegaly. No bruits or masses. Good bowel sounds.  Extremities: no cyanosis, clubbing, rash, trace edema  LUE AVF/ecchymosis Neuro: alert & orientedx3, cranial nerves grossly intact. moves all 4 extremities w/o difficulty. Affect pleasant    Telemetry   SR 80-90 Personally reviewed  Labs    CBC Recent Labs    04/13/21 0257 04/14/21 0340  WBC 4.0 6.3  HGB 8.8* 9.1*  HCT 28.2* 28.8*  MCV 98.6 98.6  PLT 108* 115*    Basic Metabolic Panel Recent Labs    04/12/21 0224 04/13/21 0257 04/14/21 0340  NA 132* 134* 136  K 4.3 3.9 4.7  CL 95* 97* 98  CO2 25 27 26   GLUCOSE 336* 180* 120*  BUN 47* 33* 50*  CREATININE 6.22* 5.33* 6.80*  CALCIUM 8.2* 8.3* 8.5*  MG 2.1  --   --   PHOS 6.1*  --   --     Liver Function Tests Recent Labs    04/12/21 0224  ALBUMIN  2.4*    No results for input(s): LIPASE, AMYLASE in the last 72 hours.  Cardiac Enzymes No results for input(s): CKTOTAL, CKMB, CKMBINDEX, TROPONINI in the last 72 hours.  BNP: BNP (last 3 results) Recent Labs    04/10/21 1033  BNP 4,239.3*     ProBNP (last 3 results) No results for input(s): PROBNP in the last 8760 hours.   D-Dimer No results for input(s): DDIMER in the last 72 hours.  Hemoglobin A1C No results for input(s): HGBA1C in the last 72 hours.  Fasting Lipid Panel No results for input(s): CHOL, HDL, LDLCALC, TRIG, CHOLHDL, LDLDIRECT in the last 72 hours.  Thyroid Function Tests No results for input(s): TSH, T4TOTAL, T3FREE, THYROIDAB in the last 72 hours.  Invalid input(s): FREET3  Other results:   Imaging    No results found.   Medications:     Scheduled Medications:  atorvastatin  80 mg Oral Daily   Chlorhexidine Gluconate Cloth  6 each Topical Q0600   escitalopram  10 mg Oral Daily   gentamicin cream  1 application Topical Daily   dianeal solution for CAPD/CCPD with heparin   Peritoneal Dialysis Q24H   heparin  5,000 Units Subcutaneous Q8H   insulin aspart  0-6 Units Subcutaneous TID WC  insulin glargine  15 Units Subcutaneous QHS   pantoprazole  40 mg Oral Q supper   sodium chloride flush  3 mL Intravenous Q12H   vancomycin variable dose per unstable renal function (pharmacist dosing)   Does not apply See admin instructions    Infusions:  sodium chloride 250 mL (04/13/21 2145)   sodium chloride     sodium chloride Stopped (04/14/21 0623)   piperacillin-tazobactam (ZOSYN)  IV 100 mL/hr at 04/14/21 0640    PRN Medications: sodium chloride, clonazepam, docusate sodium, ondansetron (ZOFRAN) IV, polyethylene glycol, sodium chloride, sodium chloride flush    Patient Profile   Very complicated situation. 65 y/o male with multiple medical problems including DM2, ESRD, charcot joint with active wound, CAD with CABG in 2021 admitted  with altered mental status. Creatinine on admit 10. Switched from PD to HD. Found to have LLE wound with concern PAD/critical limb ischemia. Hs trop 1300   Echo done EF 30-35% (previous EF was 50%) pattern on echo suggestive of possible Tako-T[place]o.   Assessment/Plan   AMS - suspect due to uremia - resolved with HD   2. ESRD -Has been seen at [facility] for possible transplant.  -Previously on PD but ineffective drain last week. Switched iHD - Nephrology following    3. CAD  - H/O CABG 2021  - No chest pain.  - HS Trop 2197>5883. - For LHC/coronary angio this afternoon   4. Acute Systolic Heart Failure  - Stress ECHO 02/2021 EF ~55%.  - ECHO this admit EF 30-35%  - Fluid management via dialysis - HS Trop 2549>8264.  - No bb for now.  - No spiro /arni/dig with ESRD  - For cath today   5.Charcot right - Dr Sharol Given following. On vancomycin/zosyn.    6. PAD  -LLE concerning for ischemia.  -04/10/21 Vascular consulted. Plan for  Arteriogram to assess flow - hopefully will be able to do during cath today    Length of Stay: 4  Glori Bickers, MD  04/14/2021, 1:29 PM  Advanced Heart Failure Team Pager [phone] (M-F; 7a - 5p)  Please contact [facility] for night-coverage after hours (5p -7a ) and weekends on [email or web address]

## 2021-04-15 ENCOUNTER — Encounter (HOSPITAL_COMMUNITY): Payer: Managed Care, Other (non HMO)

## 2021-04-15 LAB — CBC
HCT: 28 % — ABNORMAL LOW (ref 39.0–52.0)
Hemoglobin: 8.8 g/dL — ABNORMAL LOW (ref 13.0–17.0)
MCH: 30.4 pg (ref 26.0–34.0)
MCHC: 31.4 g/dL (ref 30.0–36.0)
MCV: 96.9 fL (ref 80.0–100.0)
Platelets: 127 10*3/uL — ABNORMAL LOW (ref 150–400)
RBC: 2.89 MIL/uL — ABNORMAL LOW (ref 4.22–5.81)
RDW: 15.2 % (ref 11.5–15.5)
WBC: 7 10*3/uL (ref 4.0–10.5)
nRBC: 0 % (ref 0.0–0.2)

## 2021-04-15 LAB — BASIC METABOLIC PANEL
Anion gap: 13 (ref 5–15)
BUN: 31 mg/dL — ABNORMAL HIGH (ref 8–23)
CO2: 25 mmol/L (ref 22–32)
Calcium: 8.1 mg/dL — ABNORMAL LOW (ref 8.9–10.3)
Chloride: 97 mmol/L — ABNORMAL LOW (ref 98–111)
Creatinine, Ser: 5.16 mg/dL — ABNORMAL HIGH (ref 0.61–1.24)
GFR, Estimated: 12 mL/min — ABNORMAL LOW (ref >60)
Glucose, Bld: 95 mg/dL (ref 70–99)
Potassium: 4 mmol/L (ref 3.5–5.1)
Sodium: 135 mmol/L (ref 135–145)

## 2021-04-15 LAB — GLUCOSE, CAPILLARY
Glucose-Capillary: 85 mg/dL (ref 70–99)
Glucose-Capillary: 118 mg/dL — ABNORMAL HIGH (ref 70–99)
Glucose-Capillary: 167 mg/dL — ABNORMAL HIGH (ref 70–99)
Glucose-Capillary: 150 mg/dL — ABNORMAL HIGH (ref 70–99)

## 2021-04-15 MED ORDER — INSULIN GLARGINE 100 UNIT/ML ~~LOC~~ SOLN
10.0000 [IU] | Freq: Every day | SUBCUTANEOUS | Status: DC
  Administered 2021-04-15: 10 [IU] via SUBCUTANEOUS
  Filled 2021-04-15 (×2): qty 0.1

## 2021-04-15 NOTE — Plan of Care (Signed)
  Problem: Education: Goal: Knowledge of General Education information will improve Description: Including pain rating scale, medication(s)/side effects and non-pharmacologic comfort measures Outcome: Progressing   Problem: Health Behavior/Discharge Planning: Goal: Ability to manage health-related needs will improve Outcome: Progressing   Problem: Activity: Goal: Risk for activity intolerance will decrease Outcome: Progressing   

## 2021-04-15 NOTE — Progress Notes (Addendum)
[facility]  PROGRESS NOTE  Maxwell Harmon:[id number] DOB: 08/16/1956 DOA: 04/10/2021 PCP: [name], DO   Brief HPI:   65 year old male with history of CAD s/p CABG in 2021, preserved EF, Charcot foot, ESRD on peritoneal dialysis presented with cellulitis of left foot/diabetic foot wound.  Patient was admitted to ICU started on broad-spectrum antibiotic for diabetic foot wound.  Nephrology was consulted and patient was switched to hemodialysis due to PD catheter issues. Patient significantly improved so he was transferred out of ICU    Subjective  Patient seen during hemodialysis, underwent left heart catheterization yesterday, showed severe multivessel CAD.  Not amenable for further intervention.  Abdominal aortogram showed bilateral popliteal artery occlusion, left lower extremity with single-vessel PT runoff which is diffusely diseased.   Assessment/Plan:     Sepsis due to left lower extremity cellulitis -Sepsis physiology has resolved -Continue vancomycin and Zosyn -Blood cultures x2 are negative to date   Metabolic encephalopathy -Resolved -Likely from above   Left diabetic foot wound/PAD -Dr. Sharol Given was consulted -Vascular surgery has seen the patient; plan to take for aortogram today. -X-ray of left foot tibia and fibula was negative for osteomyelitis -Aortogram showed bilateral popliteal artery occlusions, diffusely diseased posterior tibial artery. -Vascular surgery following, plan for open revascularization   ESRD -Patient has been on peritoneal dialysis at home -Switched to IHD  -Nephrology following   Acute systolic heart failure -Echocardiogram during this admission showed EF of 30 to 35% -Stress echo from April 2022 showed EF of 55% -Patient currently on HD  -Patient has history of CABG ; cardiac cath showed severe three-vessel disease, patient graft with poor runoff into heavily diseased native circulation. -Medical management recommended,  no options for revascularization   Diabetes mellitus type 2 -CBG has been running low -We will change Lantus from 15 units subcu daily to 10 units subcu daily -Continue very sensitive sliding scale insulin -Blood glucose is labile; we will continue to monitor blood glucose and adjust insulin as needed    Scheduled medications:    atorvastatin  80 mg Oral Daily   Chlorhexidine Gluconate Cloth  6 each Topical Q0600   escitalopram  10 mg Oral Daily   gentamicin cream  1 application Topical Daily   dianeal solution for CAPD/CCPD with heparin   Peritoneal Dialysis Q24H   heparin  5,000 Units Subcutaneous Q8H   insulin aspart  0-6 Units Subcutaneous TID WC   insulin glargine  15 Units Subcutaneous QHS   pantoprazole  40 mg Oral Q supper   sodium chloride flush  3 mL Intravenous Q12H   sodium chloride flush  3 mL Intravenous Q12H   vancomycin variable dose per unstable renal function (pharmacist dosing)   Does not apply See admin instructions         Data Reviewed:   CBG:  Recent Labs  Lab 04/14/21 1231 04/14/21 1715 04/14/21 1738 04/14/21 2115 04/15/21 0537  GLUCAP 126* 67* 70 152* 85    SpO2: 96 % O2 Flow Rate (L/min): 2 L/min    Vitals:   04/15/21 1030 04/15/21 1100 04/15/21 1130 04/15/21 1200  BP: 127/75 138/81 (!) 148/75 135/77  Pulse: 86 90 90 90  Resp: (!) 22 17  (!) 24  Temp:      TempSrc:      SpO2:      Weight:      Height:         Intake/Output Summary (Last 24 hours) at 04/15/2021 1245 Last data filed  at 04/15/2021 0846 Gross per 24 hour  Intake 963.32 ml  Output 0 ml  Net 963.32 ml    06/09 1901 - 06/11 0700 In: 1415.8 [P.O.:960; I.V.:5.8] Out: 2000   Filed Weights   04/14/21 1130 04/15/21 0353 04/15/21 0903  Weight: 91.3 kg 90.9 kg 90.3 kg    CBC:  Recent Labs  Lab 04/12/21 0224 04/13/21 0257 04/14/21 0340 04/14/21 1836 04/15/21 0330  WBC 4.1 4.0 6.3 6.3 7.0  HGB 9.3* 8.8* 9.1* 9.3* 8.8*  HCT 30.6* 28.2* 28.8* 29.9* 28.0*   PLT 109* 108* 115* 114* 127*  MCV 100.0 98.6 98.6 98.0 96.9  MCH 30.4 30.8 31.2 30.5 30.4  MCHC 30.4 31.2 31.6 31.1 31.4  RDW 15.3 15.3 15.1 15.2 15.2    Complete metabolic panel:  Recent Labs  Lab 04/10/21 1033 04/10/21 1640 04/10/21 1725 04/10/21 1725 04/10/21 1819 04/10/21 2118 04/10/21 2131 04/10/21 2132 04/10/21 2224 04/11/21 0114 04/12/21 0224 04/13/21 0257 04/14/21 0340 04/14/21 1836 04/15/21 0330  NA 136  --   --   --   --  136  --   --   --  135 132* 134* 136 135 135  K 4.5  --   --   --   --  4.3  --   --   --  3.0* 4.3 3.9 4.7 3.9 4.0  CL 98  --   --   --   --  97*  --   --   --  98 95* 97* 98 97* 97*  CO2 23  --   --   --   --  25  --   --   --  26 25 27 26 25 25   GLUCOSE 106*  --   --   --   --  57*  --   --   --  101* 336* 180* 120* 114* 95  BUN 82*  --   --   --   --  87*  --   --   --  33* 47* 33* 50* 24* 31*  CREATININE 9.76*  --   --   --   --  10.25*  --   --   --  4.44* 6.22* 5.33* 6.80* 4.49*  4.56* 5.16*  CALCIUM 8.7*  --   --   --   --  8.1*  --   --   --  8.2* 8.2* 8.3* 8.5* 8.2* 8.1*  AST  --   --  23  --   --   --  29  --  23  --   --   --   --   --   --   ALT  --   --  15  --   --   --  14  --  15  --   --   --   --   --   --   ALKPHOS  --   --  86  --   --   --  79  --  85  --   --   --   --   --   --   BILITOT  --   --  0.7  --   --   --  1.0  --  1.0  --   --   --   --   --   --   ALBUMIN  --   --  2.3*   < >  --  2.4* 2.3*  --  2.2*  --  2.4*  --   --  2.2*  --   MG  --   --   --   --   --   --   --   --   --  1.7 2.1  --   --   --   --   DDIMER  --   --   --   --   --   --   --  2.86*  --   --   --   --   --   --   --   LATICACIDVEN  --   --   --   --  1.0  --   --   --  0.9  --   --   --   --   --   --   INR  --   --   --   --   --   --   --  1.2  --   --   --   --   --   --   --   HGBA1C  --  8.2*  --   --   --   --   --   --   --   --   --   --   --   --   --   BNP 4,239.3*  --   --   --   --   --   --   --   --   --   --   --   --    --   --    < > = values in this interval not displayed.    Recent Labs  Lab 04/10/21 2131  LIPASE 25  AMYLASE 27*    Recent Labs  Lab 04/10/21 1033 04/10/21 1150 04/10/21 2132  DDIMER  --   --  2.86*  BNP 4,239.3*  --   --   SARSCOV2NAA  --  NEGATIVE  --     ------------------------------------------------------------------------------------------------------------------ No results for input(s): CHOL, HDL, LDLCALC, TRIG, CHOLHDL, LDLDIRECT in the last 72 hours.   Lab Results  Component Value Date   HGBA1C 8.2 (H) 04/10/2021   ------------------------------------------------------------------------------------------------------------------ No results for input(s): TSH, T4TOTAL, T3FREE, THYROIDAB in the last 72 hours.  Invalid input(s): FREET3 ------------------------------------------------------------------------------------------------------------------ No results for input(s): VITAMINB12, FOLATE, FERRITIN, TIBC, IRON, RETICCTPCT in the last 72 hours.  Coagulation profile Recent Labs  Lab 04/10/21 2132  INR 1.2   No results for input(s): DDIMER in the last 72 hours.   Cardiac Enzymes No results for input(s): CKTOTAL, CKMB, CKMBINDEX, TROPONINI in the last 168 hours.  ------------------------------------------------------------------------------------------------------------------    Component Value Date/Time   BNP 4,239.3 (H) 04/10/2021 1033     Antibiotics: Anti-infectives (From admission, onward)    Start     Dose/Rate Route Frequency Ordered Stop   04/14/21 1515  vancomycin (VANCOREADY) IVPB 500 mg/100 mL        500 mg 100 mL/hr over 60 Minutes Intravenous  Once 04/14/21 1428 04/14/21 1852   04/12/21 2000  vancomycin (VANCOREADY) IVPB 750 mg/150 mL        750 mg 150 mL/hr over 60 Minutes Intravenous NOW 04/12/21 1852 04/12/21 2211   04/11/21 1030  vancomycin (VANCOREADY) IVPB 1500 mg/300 mL        1,500 mg 150 mL/hr over 120 Minutes Intravenous   Once 04/11/21 0937 04/11/21 1329   04/11/21 0400  piperacillin-tazobactam (ZOSYN) IVPB 2.25 g        2.25 g 100 mL/hr over 30 Minutes Intravenous Every 8 hours 04/10/21 1951     04/10/21 1951  vancomycin variable dose per unstable renal function (pharmacist dosing)         Does not apply See admin instructions 04/10/21 1951     04/10/21 1900  vancomycin (VANCOREADY) IVPB 1750 mg/350 mL        1,750 mg 175 mL/hr over 120 Minutes Intravenous  Once 04/10/21 1836 04/10/21 2322   04/10/21 1845  piperacillin-tazobactam (ZOSYN) IVPB 3.375 g        3.375 g 100 mL/hr over 30 Minutes Intravenous  Once 04/10/21 1836 04/10/21 2121        Radiology Reports  CARDIAC CATHETERIZATION  Result Date: 04/14/2021  Ost RCA to Prox RCA lesion is 100% stenosed.  Prox Cx lesion is 95% stenosed.  Prox LAD to Mid LAD lesion is 100% stenosed.  Dist LAD lesion is 100% stenosed.  Findings: 1. LM ok 2. LAD 100% prox 3. LCX 95% prox 4. RCA 100% prox 5. LIMA to LAD widely patent but LAD is occluded immediately after LIMA plugs in 6. Sequential SVG to OM1 & OM2. Tortuous graft with mild narrowing at insertion to OM1 but otherwise OK. OMs are small 7. Elevated filling pressures with Ao 178/71 (111) LVEDP 36 Assessment: Severe 3v CAD with patent grafts but poor runoff into heavily diseased native circulation. Plan/Discussion: Continue medical therapy. No other options for revascularization. Glori Bickers, MD 5:18 PM   PERIPHERAL VASCULAR CATHETERIZATION  Result Date: 04/14/2021 Procedure: Abdominal aortogram with bilateral lower extremity runoff, third order catheterization left superficial femoral artery ultrasound right groin Preoperative diagnosis: Nonhealing wound left foot Postoperative diagnosis: Same Anesthesia: Local with IV sedation Operative findings: 1.  Occlusion left popliteal artery with reconstitution of the below-knee popliteal artery and one-vessel posterior tibial runoff severe vessel calcification  diffusely 2.  Occlusion right popliteal artery with reconstitution of the posterior tibial artery one-vessel runoff to the right foot Operative details: After obtaining informed consent, the patient was taken the [place] lab.  The patient was placed in supine position on the angio table.  Both groins were prepped and draped in usual sterile fashion.  Local anesthesia was infiltrated over the right common femoral artery.  Ultrasound was used to identify the right common femoral artery and femoral bifurcation.  An introducer needle was used to cannulate the right common femoral artery and an 035 versa core wire threaded in the abdominal aorta under fluoroscopic guidance.  A 5 French sheath was then placed over the guidewire and this was thoroughly flushed heparinized saline.  A 5 French pigtail catheter was then advanced over the guidewire to the abdominal aorta.  Abdominal aortogram was obtained in AP projection.  Left renal artery is patent.  Portions of the right renal artery that were visualized were diseased but patent.  At this was not fully visualized since the patient is end-stage renal disease.  The infrarenal abdominal aorta is calcified throughout its wall as well as the left and right external common and internal iliac arteries but they are patent. Next bilateral lower extremity runoff views were obtained through the pigtail catheter.  In the left lower extremity the left common femoral artery profunda femoris artery and superficial femoral arteries are patent.  There again heavily calcified.  There was poor visualization of the vessels distally.  Therefore the pigtail catheter was removed over guidewire and swapped out for  a 5 [place] crossover catheter.  An 035 angled Glidewire was then advanced through this all the way down to the mid left superficial femoral artery.  Due to the steep aortic bifurcation an 035 N[place] cross was used to go up and over the aortic bifurcation and this was advanced into the distal  left superficial femoral artery.  Left lower extremity arteriogram was then obtained.  Left popliteal artery is occluded with diffuse calcific plaque extending from above the knee across the knee joint to the below-knee popliteal artery.  The more distal below-knee popliteal artery is patent without significant stenosis but is definitely calcified in the wall.  The anterior tibial artery and peroneal arteries are occluded.  The posterior tibial artery is the sole runoff vessel to the right foot.  Again this is diffusely calcified but it is patent all the way into the foot. At this point the Ferd Hibbs cross was removed and additional views of the right lower extremity were obtained through the sheath in the right groin.  The right common femoral profunda femoris is patent.  The right superficial femoral artery is patent.  The right popliteal artery is occluded essentially throughout its course.  The intertibial artery is occluded.  The peroneal artery is occluded.  The posterior tibial artery does reconstitute via collaterals and the sole runoff vessel to the right foot.  It is heavily calcified. At this point Dr. Haroldine Laws proceeded with the cardiac catheterization portion of the case and this is dictated as a separate operative note. Operative management: Patient has fairly heavily calcified arteries but best operation for improved perfusion of his foot is most likely going to be above-knee to below-knee popliteal bypass if his vessels are deemed to heavily calcified for grafting or if he is a high cardiac risk and potentially he would be a candidate for atherectomy of his popliteal artery plus minus stenting.  However, I think this would have a less durable result. Ruta Hinds, MD [facility] [place] Office: [phone]      DVT prophylaxis: Heparin  Code Status: Full code  Family Communication: No family at bedside   Consultants: Cardiology Vascular  surgery PCCM  Procedures:     Objective    Physical Examination:  General-appears in no acute distress Heart-S1-S2, regular, no murmur auscultated Lungs-clear to auscultation bilaterally, no wheezing or crackles auscultated Abdomen-soft, nontender, no organomegaly Extremities-no edema in the lower extremities Neuro-alert, oriented x3, no focal deficit noted   Status is: Inpatient  Dispo: The patient is from: Home              Anticipated d/c is to: Home              Anticipated d/c date is: 04/16/2021              Patient currently not stable for discharge  Barrier to discharge-awaiting heart catheterization  COVID-19 Labs  No results for input(s): DDIMER, FERRITIN, LDH, CRP in the last 72 hours.   Lab Results  Component Value Date   [place] NEGATIVE 04/10/2021    Microbiology  Recent Results (from the past 240 hour(s))  Resp Panel by RT-PCR (Flu A&B, Covid) Nasopharyngeal Swab     Status: None   Collection Time: 04/10/21 11:50 AM   Specimen: Nasopharyngeal Swab; Nasopharyngeal(NP) swabs in vial transport medium  Result Value Ref Range Status   SARS Coronavirus 2 by RT PCR NEGATIVE NEGATIVE Final    Comment: (NOTE) SARS-CoV-2 target nucleic acids are NOT DETECTED.  The SARS-CoV-2 RNA is generally detectable in upper respiratory specimens during the acute phase of infection. The lowest concentration of SARS-CoV-2 viral copies this assay can detect is 138 copies/mL. A negative result does not preclude SARS-Cov-2 infection and should not be used as the sole basis for treatment or other patient management decisions. A negative result may occur with  improper specimen collection/handling, submission of specimen other than nasopharyngeal swab, presence of viral mutation(s) within the areas targeted by this assay, and inadequate number of viral copies(<138 copies/mL). A negative result must be combined with clinical observations, patient history, and  epidemiological information. The expected result is Negative.  Fact Sheet for Patients:  EntrepreneurPulse.com.au  Fact Sheet for Healthcare Providers:  IncredibleEmployment.be  This test is no t yet approved or cleared by the [place] FDA and  has been authorized for detection and/or diagnosis of SARS-CoV-2 by FDA under an Emergency Use Authorization (EUA). This EUA will remain  in effect (meaning this test can be used) for the duration of the COVID-19 declaration under Section 564(b)(1) of the Act, 21 U.S.C.section 360bbb-3(b)(1), unless the authorization is terminated  or revoked sooner.       Influenza A by PCR NEGATIVE NEGATIVE Final   Influenza B by PCR NEGATIVE NEGATIVE Final    Comment: (NOTE) The Xpert Xpress SARS-CoV-2/FLU/RSV plus assay is intended as an aid in the diagnosis of influenza from Nasopharyngeal swab specimens and should not be used as a sole basis for treatment. Nasal washings and aspirates are unacceptable for Xpert Xpress SARS-CoV-2/FLU/RSV testing.  Fact Sheet for Patients: EntrepreneurPulse.com.au  Fact Sheet for Healthcare Providers: IncredibleEmployment.be  This test is not yet approved or cleared by the [place] FDA and has been authorized for detection and/or diagnosis of SARS-CoV-2 by FDA under an Emergency Use Authorization (EUA). This EUA will remain in effect (meaning this test can be used) for the duration of the COVID-19 declaration under Section 564(b)(1) of the Act, 21 U.S.C. section 360bbb-3(b)(1), unless the authorization is terminated or revoked.  Performed at [facility], [place] [address] [place], [place] [address]   Culture, blood (routine x 2)     Status: None (Preliminary result)   Collection Time: 04/10/21 10:24 PM   Specimen: BLOOD  Result Value Ref Range Status   Specimen Description BLOOD SITE NOT SPECIFIED  Final   Special Requests    Final    BOTTLES DRAWN AEROBIC AND ANAEROBIC Blood Culture adequate volume   Culture   Final    NO GROWTH 4 DAYS Performed at [facility], [address] [place], [place] [address]    Report Status PENDING  Incomplete  Culture, blood (routine x 2)     Status: None (Preliminary result)   Collection Time: 04/10/21 10:24 PM   Specimen: BLOOD  Result Value Ref Range Status   Specimen Description BLOOD SITE NOT SPECIFIED  Final   Special Requests   Final    BOTTLES DRAWN AEROBIC AND ANAEROBIC Blood Culture adequate volume   Culture   Final    NO GROWTH 4 DAYS Performed at [facility], [address], [place], [place] [address]    Report Status PENDING  Incomplete  MRSA PCR Screening     Status: None   Collection Time: 04/11/21 10:16 AM   Specimen: Nasal Mucosa; Nasopharyngeal  Result Value Ref Range Status   MRSA by PCR NEGATIVE NEGATIVE Final    Comment:        The GeneXpert  MRSA Assay (FDA approved for NASAL specimens only), is one component of a comprehensive MRSA colonization surveillance program. It is not intended to diagnose MRSA infection nor to guide or monitor treatment for MRSA infections. Performed at [facility], [place] [address], [place], [place] [address]     Pressure Injury 04/10/21 Heel Left;Lateral Stage 3 -  Full thickness tissue loss. Subcutaneous fat may be visible but bone, tendon or muscle are NOT exposed. (Active)  04/10/21 2200  Location: Heel  Location Orientation: Left;Lateral  Staging: Stage 3 -  Full thickness tissue loss. Subcutaneous fat may be visible but bone, tendon or muscle are NOT exposed.  Wound Description (Comments):   Present on Admission: Yes     Pressure Injury 04/10/21 Toe (Comment  which one) Anterior;Left Stage 2 -  Partial thickness loss of dermis presenting as a shallow open injury with a red, pink wound bed without slough. (Active)  04/10/21 2200  Location: Toe (Comment  which one) (dorsal great toe)   Location Orientation: Anterior;Left  Staging: Stage 2 -  Partial thickness loss of dermis presenting as a shallow open injury with a red, pink wound bed without slough.  Wound Description (Comments):   Present on Admission: Yes          [place]   [facility] If 7PM-7AM, please contact night-coverage at [email or web address], Office  [phone]   04/15/2021, 12:45 PM  LOS: 5 days

## 2021-04-15 NOTE — Progress Notes (Addendum)
  Progress Note    04/15/2021 8:53 AM 1 Day Post-Op  Subjective:  No complaints this morning   Vitals:   04/15/21 0353 04/15/21 0824  BP: (!) 141/78 127/65  Pulse: 89 87  Resp:  18  Temp: 97.7 F (36.5 C) (!) 97.3 F (36.3 C)  SpO2: 95% 95%   Physical Exam: Lungs:  non labored Incisions:  R groin cath site without firm hematoma Extremities:  dressings L foot left in place Neurologic: a&O  CBC    Component Value Date/Time   WBC 7.0 04/15/2021 0330   RBC 2.89 (L) 04/15/2021 0330   HGB 8.8 (L) 04/15/2021 0330   HCT 28.0 (L) 04/15/2021 0330   PLT 127 (L) 04/15/2021 0330   MCV 96.9 04/15/2021 0330   MCH 30.4 04/15/2021 0330   MCHC 31.4 04/15/2021 0330   RDW 15.2 04/15/2021 0330    BMET    Component Value Date/Time   NA 135 04/15/2021 0330   K 4.0 04/15/2021 0330   CL 97 (L) 04/15/2021 0330   CO2 25 04/15/2021 0330   GLUCOSE 95 04/15/2021 0330   BUN 31 (H) 04/15/2021 0330   CREATININE 5.16 (H) 04/15/2021 0330   CALCIUM 8.1 (L) 04/15/2021 0330   GFRNONAA 12 (L) 04/15/2021 0330   GFRAA 10 (L) 01/11/2019 0523    INR    Component Value Date/Time   INR 1.2 04/10/2021 2132     Intake/Output Summary (Last 24 hours) at 04/15/2021 0853 Last data filed at 04/15/2021 0846 Gross per 24 hour  Intake 963.32 ml  Output 2000 ml  Net -1036.68 ml     Assessment/Plan:  65 y.o. male is s/p Aortogram with BLE runoff 1 Day Post-Op   -R groin cath site is unremarkable -Arteriogram demonstrated bilateral popliteal artery occlusions; LLE with single vessel PTA runoff which is diffusely diseased -continue current wound care L foot -Dr Stanford Breed will discuss endovascular vs bypass surgery options with the patient.  He is aware that although bypass surgery would likely be more durable, it carries a higher cardiac risk   Dagoberto Ligas, PA-C [facility] [phone] 04/15/2021 8:53 AM  VASCULAR STAFF ADDENDUM: I have independently interviewed and  examined the patient. I agree with the above.  Discussed options with patient going forward: local care with no revascularization, complex endovascular revascularization, open revascularization. Reviewed risks, benefits, and alternatives to each.  He is interested in open revascularization at the moment.  Will check vein mapping, he has had a saphenous vein harvest in the past for coronary artery bypass grafting and may not have usable greater saphenous vein.  We will see if he has small saphenous vein or upper extremity vein that is usable for bypass.  Will discuss with cardiology team regarding cardiac risk open revascularization.  Yevonne Aline. Stanford Breed, MD [facility] Phone Number: [phone] 04/15/2021 10:33 AM

## 2021-04-15 NOTE — Plan of Care (Signed)
  Problem: Education: Goal: Knowledge of General Education information will improve Description: Including pain rating scale, medication(s)/side effects and non-pharmacologic comfort measures Outcome: Progressing   Problem: Health Behavior/Discharge Planning: Goal: Ability to manage health-related needs will improve Outcome: Progressing   Problem: Clinical Measurements: Goal: Cardiovascular complication will be avoided Outcome: Progressing   Problem: Coping: Goal: Level of anxiety will decrease Outcome: Progressing   Problem: Pain Managment: Goal: General experience of comfort will improve Outcome: Progressing

## 2021-04-15 NOTE — Procedures (Signed)
I was present at this dialysis session. I have reviewed the session itself and made appropriate changes.   Vital signs in last 24 hours:  Temp:  [97.3 F (36.3 C)-98.6 F (37 C)] 98.6 F (37 C) (06/11 0903) Pulse Rate:  [0-142] 86 (06/11 1030) Resp:  [0-124] 22 (06/11 1030) BP: (127-179)/(65-96) 127/75 (06/11 1030) SpO2:  [0 %-100 %] 96 % (06/11 0903) Weight:  [90.3 kg-91.3 kg] 90.3 kg (06/11 0903) Weight change: 0.3 kg Filed Weights   04/14/21 1130 04/15/21 0353 04/15/21 0903  Weight: 91.3 kg 90.9 kg 90.3 kg    Recent Labs  Lab 04/14/21 1836 04/15/21 0330  NA 135 135  K 3.9 4.0  CL 97* 97*  CO2 25 25  GLUCOSE 114* 95  BUN 24* 31*  CREATININE 4.49*  4.56* 5.16*  CALCIUM 8.2* 8.1*  PHOS 3.7  --     Recent Labs  Lab 04/14/21 0340 04/14/21 1836 04/15/21 0330  WBC 6.3 6.3 7.0  HGB 9.1* 9.3* 8.8*  HCT 28.8* 29.9* 28.0*  MCV 98.6 98.0 96.9  PLT 115* 114* 127*    Scheduled Meds:  atorvastatin  80 mg Oral Daily   Chlorhexidine Gluconate Cloth  6 each Topical Q0600   escitalopram  10 mg Oral Daily   gentamicin cream  1 application Topical Daily   dianeal solution for CAPD/CCPD with heparin   Peritoneal Dialysis Q24H   heparin  5,000 Units Subcutaneous Q8H   insulin aspart  0-6 Units Subcutaneous TID WC   insulin glargine  15 Units Subcutaneous QHS   pantoprazole  40 mg Oral Q supper   sodium chloride flush  3 mL Intravenous Q12H   sodium chloride flush  3 mL Intravenous Q12H   vancomycin variable dose per unstable renal function (pharmacist dosing)   Does not apply See admin instructions   Continuous Infusions:  sodium chloride 250 mL (04/15/21 0541)   sodium chloride     sodium chloride     piperacillin-tazobactam (ZOSYN)  IV 2.25 g (04/15/21 0544)   PRN Meds:.sodium chloride, sodium chloride, acetaminophen, acetaminophen, clonazepam, docusate sodium, hydrALAZINE, labetalol, ondansetron (ZOFRAN) IV, polyethylene glycol, sodium chloride, sodium chloride flush,  sodium chloride flush    Assessment/Plan: Dialysis Orders: Center: [facility], [place]  on CCPD with 3 exchanges, 2700 ml fill volume, fill time 30 minutes, drain time 30 minutes, dwell time 2 hours.   NEPHROLOGIST: Claris Gladden  LOCATION: [place]  SCHEDULE: M-W-F 2nd Shift  EDW: TBD kg.  KIDNEY:  LITERS PROC: liters/treatment  HD TIME: 4 hrs. 0 min.  ACCESS: L Upper Arm AVF  NEEDLE SIZE: 15 ga.  ANTICOAG: Standard Heparin Per Protocol  BATH: 2.0 K-2.5Ca 35 HCO3  QB: 500 ml/min  QD: Auto Flow Protocol     Assessment/Plan: NSTEMI - Cardiology consulted and has EKG changes.  ECHO with significant decrease in EF from April 2022.  LHC 04/14/21 revealed ost RCA to prox rca 100%, prox Cx 95%, prox LAD to mid LAD is 100%, dist LAD 100% stenosed, LIMA to LAD widely patent, but LAD occluded immediately after LIMA, elevated filling pressures LVEDP 36.   Plan for extra HD session today with UF. Acute systolic CHF - EF 82-99% (down from 55% in April).  Started on levophed yesterday and tolerated 2 liters UF with HD 04/10/21.  No uF with CCPD but tolerated 2.4 liters with HD yesterday.  Still with some edema so will plan HD with UF today prior to [facility].  Heart  failure team consulted.    ESRD -  Normally does CCPD at home but had issues with machine and draining so had sporadic treatments and was set up for IHD day he presented to [facility] ED.  He tolerated HD well last night with UF of 2 liters.   Transitioned to IHD due to inadequate drainage/UF with CCPD (likely catheter malfunction). Plan for HD today with UF prior to [facility] Will need to have PD catheter evaluated as an outpatient.     Given miralax daily to help with function of PD cath and cont with weekly flushes to keep it patent. Will continue with IHD for now and will need to f/u with his primary nephrologist in [place] to decide about resuming CCPD or continue with IHD  Hypertension/volume  - volume overload and  plan for HD with UF as above.  ECHO with drop in EF started on levophed with improved BP.  Currently off levophed.  Anemia  - due to ESRD.  Will dose with ESA and transfuse prn.  Metabolic bone disease -  Continue with home meds  Nutrition -  Renal diet, carb modified Vascular access:  LUE AVF +T/B Diabetic heel/foot ulcer - Vascular surgery consulted as well as Dr. Sharol Given.  Currently on vanc/zosyn.  For angiogram today with LHC by Dr. Stanford Breed. Hypokalemia - will replete with IV KCl x 2 runs and follow Hypomagnesemia - repleted with 2 grams IV and follow.     Donetta Potts,  MD 04/15/2021, 10:34 AM

## 2021-04-15 NOTE — Progress Notes (Signed)
VASCULAR LAB    Attempted to get patient for vein mapping, however, patient was in hemodialysis.  We were unable to make a second attempt today.  We will attempt mapping first thing 04/16/21.   Ronal Maybury, RVT 04/15/2021, 6:01 PM

## 2021-04-16 ENCOUNTER — Inpatient Hospital Stay (HOSPITAL_COMMUNITY): Payer: Managed Care, Other (non HMO)

## 2021-04-16 DIAGNOSIS — N185 Chronic kidney disease, stage 5: Secondary | ICD-10-CM

## 2021-04-16 DIAGNOSIS — I739 Peripheral vascular disease, unspecified: Secondary | ICD-10-CM

## 2021-04-16 DIAGNOSIS — E1122 Type 2 diabetes mellitus with diabetic chronic kidney disease: Secondary | ICD-10-CM

## 2021-04-16 DIAGNOSIS — Z01810 Encounter for preprocedural cardiovascular examination: Secondary | ICD-10-CM

## 2021-04-16 LAB — VANCOMYCIN, RANDOM: Vancomycin Rm: 24

## 2021-04-16 LAB — CULTURE, BLOOD (ROUTINE X 2)
Culture: NO GROWTH
Culture: NO GROWTH
Special Requests: ADEQUATE
Special Requests: ADEQUATE

## 2021-04-16 LAB — BASIC METABOLIC PANEL
Anion gap: 9 (ref 5–15)
BUN: 26 mg/dL — ABNORMAL HIGH (ref 8–23)
CO2: 27 mmol/L (ref 22–32)
Calcium: 8.2 mg/dL — ABNORMAL LOW (ref 8.9–10.3)
Chloride: 99 mmol/L (ref 98–111)
Creatinine, Ser: 4.31 mg/dL — ABNORMAL HIGH (ref 0.61–1.24)
GFR, Estimated: 15 mL/min — ABNORMAL LOW (ref >60)
Glucose, Bld: 100 mg/dL — ABNORMAL HIGH (ref 70–99)
Potassium: 3.8 mmol/L (ref 3.5–5.1)
Sodium: 135 mmol/L (ref 135–145)

## 2021-04-16 LAB — GLUCOSE, CAPILLARY
Glucose-Capillary: 65 mg/dL — ABNORMAL LOW (ref 70–99)
Glucose-Capillary: 76 mg/dL (ref 70–99)
Glucose-Capillary: 107 mg/dL — ABNORMAL HIGH (ref 70–99)
Glucose-Capillary: 163 mg/dL — ABNORMAL HIGH (ref 70–99)
Glucose-Capillary: 162 mg/dL — ABNORMAL HIGH (ref 70–99)
Glucose-Capillary: 157 mg/dL — ABNORMAL HIGH (ref 70–99)

## 2021-04-16 MED ORDER — INSULIN GLARGINE 100 UNIT/ML ~~LOC~~ SOLN
5.0000 [IU] | Freq: Every day | SUBCUTANEOUS | Status: DC
  Administered 2021-04-16 – 2021-04-19 (×4): 5 [IU] via SUBCUTANEOUS
  Filled 2021-04-16 (×6): qty 0.05

## 2021-04-16 MED ORDER — MUPIROCIN CALCIUM 2 % EX CREA
TOPICAL_CREAM | Freq: Every day | CUTANEOUS | Status: DC
  Filled 2021-04-16 (×2): qty 15

## 2021-04-16 MED ORDER — COLLAGENASE 250 UNIT/GM EX OINT
TOPICAL_OINTMENT | Freq: Every day | CUTANEOUS | Status: DC
  Administered 2021-04-20: 1 via TOPICAL
  Filled 2021-04-16 (×2): qty 30

## 2021-04-16 NOTE — Progress Notes (Signed)
[facility]  PROGRESS NOTE  Maxwell Harmon LKG:[id number] DOB: 19-Apr-1956 DOA: 04/10/2021 PCP: [name], DO   Brief HPI:   65 year old male with history of CAD s/p CABG in 2021, preserved EF, Charcot foot, ESRD on peritoneal dialysis presented with cellulitis of left foot/diabetic foot wound.  Patient was admitted to ICU started on broad-spectrum antibiotic for diabetic foot wound.  Nephrology was consulted and patient was switched to hemodialysis due to PD catheter issues. Patient significantly improved so he was transferred out of ICU  Underwent left heart catheterization yesterday, showed severe multivessel CAD.  Not amenable for further intervention.  Abdominal aortogram showed bilateral popliteal artery occlusion, left lower extremity with single-vessel PT runoff which is diffusely diseased.   Subjective   Patient seen and examined, denies any complaints.  Bypass surgery for severe left lower extremity p PAD scheduled for Tuesday.   Assessment/Plan:     Sepsis due to left lower extremity cellulitis -Sepsis physiology has resolved -Continue vancomycin and Zosyn -Blood cultures x2 are negative to date   Metabolic encephalopathy -Resolved -Likely from above   Left diabetic foot wound/PAD -Dr. Sharol Given was consulted -Vascular surgery has seen the patient; plan to take for aortogram today. -X-ray of left foot tibia and fibula was negative for osteomyelitis -Aortogram showed bilateral popliteal artery occlusions, diffusely diseased posterior tibial artery. -Vascular surgery following, plan for open revascularization on Tuesday.   ESRD -Patient has been on peritoneal dialysis at home -Switched to IHD  -Nephrology following   Acute systolic heart failure -Echocardiogram during this admission showed EF of 30 to 35% -Stress echo from April 2022 showed EF of 55% -Patient currently on HD  -Patient has history of CABG ; cardiac cath showed severe three-vessel disease,  patient graft with poor runoff into heavily diseased native circulation. -Medical management recommended, no options for revascularization   Diabetes mellitus type 2 -CBG has been running low -Lantus was changed from 15 units subcu daily to 10 units subcu daily; still had hypoglycemia this morning -We will change Lantus to 5 units subcu daily -Continue very sensitive sliding scale insulin -Blood glucose is labile; we will continue to monitor blood glucose and adjust insulin as needed    Scheduled medications:    atorvastatin  80 mg Oral Daily   Chlorhexidine Gluconate Cloth  6 each Topical Q0600   escitalopram  10 mg Oral Daily   gentamicin cream  1 application Topical Daily   heparin  5,000 Units Subcutaneous Q8H   insulin aspart  0-6 Units Subcutaneous TID WC   insulin glargine  10 Units Subcutaneous QHS   pantoprazole  40 mg Oral Q supper   sodium chloride flush  3 mL Intravenous Q12H   sodium chloride flush  3 mL Intravenous Q12H   vancomycin variable dose per unstable renal function (pharmacist dosing)   Does not apply See admin instructions         Data Reviewed:   CBG:  Recent Labs  Lab 04/15/21 1707 04/15/21 2034 04/16/21 0509 04/16/21 0549 04/16/21 1112  GLUCAP 167* 150* 65* 76 107*    SpO2: 100 % O2 Flow Rate (L/min): 2 L/min    Vitals:   04/16/21 0005 04/16/21 0511 04/16/21 0734 04/16/21 1115  BP:  (!) 150/83 (!) 143/73 (!) 146/75  Pulse:  95 90 87  Resp:  18 18 18   Temp:  (!) 97.4 F (36.3 C) 97.8 F (36.6 C) 98.3 F (36.8 C)  TempSrc:  Oral Oral   SpO2:  97%  98% 100%  Weight: 88.3 kg     Height:         Intake/Output Summary (Last 24 hours) at 04/16/2021 1338 Last data filed at 04/16/2021 1305 Gross per 24 hour  Intake 1134.45 ml  Output 3 ml  Net 1131.45 ml    06/10 1901 - 06/12 0700 In: 1757.8 [P.O.:1440; I.V.:44.5] Out: 3032 [Urine:31]  Filed Weights   04/15/21 0903 04/15/21 1235 04/16/21 0005  Weight: 90.3 kg 87.3 kg 88.3  kg    CBC:  Recent Labs  Lab 04/12/21 0224 04/13/21 0257 04/14/21 0340 04/14/21 1836 04/15/21 0330  WBC 4.1 4.0 6.3 6.3 7.0  HGB 9.3* 8.8* 9.1* 9.3* 8.8*  HCT 30.6* 28.2* 28.8* 29.9* 28.0*  PLT 109* 108* 115* 114* 127*  MCV 100.0 98.6 98.6 98.0 96.9  MCH 30.4 30.8 31.2 30.5 30.4  MCHC 30.4 31.2 31.6 31.1 31.4  RDW 15.3 15.3 15.1 15.2 15.2    Complete metabolic panel:  Recent Labs  Lab 04/10/21 1033 04/10/21 1640 04/10/21 1725 04/10/21 1725 04/10/21 1819 04/10/21 2118 04/10/21 2131 04/10/21 2132 04/10/21 2224 04/11/21 0114 04/12/21 0224 04/13/21 0257 04/14/21 0340 04/14/21 1836 04/15/21 0330 04/16/21 0231  NA 136  --   --   --   --  136  --   --   --  135 132* 134* 136 135 135 135  K 4.5  --   --   --   --  4.3  --   --   --  3.0* 4.3 3.9 4.7 3.9 4.0 3.8  CL 98  --   --   --   --  97*  --   --   --  98 95* 97* 98 97* 97* 99  CO2 23  --   --   --   --  25  --   --   --  26 25 27 26 25 25 27   GLUCOSE 106*  --   --   --   --  57*  --   --   --  101* 336* 180* 120* 114* 95 100*  BUN 82*  --   --   --   --  87*  --   --   --  33* 47* 33* 50* 24* 31* 26*  CREATININE 9.76*  --   --   --   --  10.25*  --   --   --  4.44* 6.22* 5.33* 6.80* 4.49*  4.56* 5.16* 4.31*  CALCIUM 8.7*  --   --   --   --  8.1*  --   --   --  8.2* 8.2* 8.3* 8.5* 8.2* 8.1* 8.2*  AST  --   --  23  --   --   --  29  --  23  --   --   --   --   --   --   --   ALT  --   --  15  --   --   --  14  --  15  --   --   --   --   --   --   --   ALKPHOS  --   --  86  --   --   --  79  --  85  --   --   --   --   --   --   --   BILITOT  --   --  0.7  --   --   --  1.0  --  1.0  --   --   --   --   --   --   --   ALBUMIN  --   --  2.3*   < >  --  2.4* 2.3*  --  2.2*  --  2.4*  --   --  2.2*  --   --   MG  --   --   --   --   --   --   --   --   --  1.7 2.1  --   --   --   --   --   DDIMER  --   --   --   --   --   --   --  2.86*  --   --   --   --   --   --   --   --   LATICACIDVEN  --   --   --   --  1.0  --    --   --  0.9  --   --   --   --   --   --   --   INR  --   --   --   --   --   --   --  1.2  --   --   --   --   --   --   --   --   HGBA1C  --  8.2*  --   --   --   --   --   --   --   --   --   --   --   --   --   --   BNP 4,239.3*  --   --   --   --   --   --   --   --   --   --   --   --   --   --   --    < > = values in this interval not displayed.    Recent Labs  Lab 04/10/21 2131  LIPASE 25  AMYLASE 27*    Recent Labs  Lab 04/10/21 1033 04/10/21 1150 04/10/21 2132  DDIMER  --   --  2.86*  BNP 4,239.3*  --   --   SARSCOV2NAA  --  NEGATIVE  --     ------------------------------------------------------------------------------------------------------------------ No results for input(s): CHOL, HDL, LDLCALC, TRIG, CHOLHDL, LDLDIRECT in the last 72 hours.   Lab Results  Component Value Date   HGBA1C 8.2 (H) 04/10/2021   ------------------------------------------------------------------------------------------------------------------ No results for input(s): TSH, T4TOTAL, T3FREE, THYROIDAB in the last 72 hours.  Invalid input(s): FREET3 ------------------------------------------------------------------------------------------------------------------ No results for input(s): VITAMINB12, FOLATE, FERRITIN, TIBC, IRON, RETICCTPCT in the last 72 hours.  Coagulation profile Recent Labs  Lab 04/10/21 2132  INR 1.2   No results for input(s): DDIMER in the last 72 hours.   Cardiac Enzymes No results for input(s): CKTOTAL, CKMB, CKMBINDEX, TROPONINI in the last 168 hours.  ------------------------------------------------------------------------------------------------------------------    Component Value Date/Time   BNP 4,239.3 (H) 04/10/2021 1033     Antibiotics: Anti-infectives (From admission, onward)    Start     Dose/Rate Route Frequency Ordered Stop   04/14/21 1515  vancomycin (VANCOREADY) IVPB 500 mg/100 mL        500 mg 100 mL/hr over 60 Minutes  Intravenous  Once 04/14/21 1428 04/14/21 1852   04/12/21 2000  vancomycin (VANCOREADY) IVPB 750 mg/150 mL        750 mg 150 mL/hr over 60 Minutes Intravenous NOW 04/12/21 1852 04/12/21 2211   04/11/21 1030  vancomycin (VANCOREADY) IVPB 1500 mg/300 mL        1,500 mg 150 mL/hr over 120 Minutes Intravenous  Once 04/11/21 0937 04/11/21 1329   04/11/21 0400  piperacillin-tazobactam (ZOSYN) IVPB 2.25 g        2.25 g 100 mL/hr over 30 Minutes Intravenous Every 8 hours 04/10/21 1951     04/10/21 1951  vancomycin variable dose per unstable renal function (pharmacist dosing)         Does not apply See admin instructions 04/10/21 1951     04/10/21 1900  vancomycin (VANCOREADY) IVPB 1750 mg/350 mL        1,750 mg 175 mL/hr over 120 Minutes Intravenous  Once 04/10/21 1836 04/10/21 2322   04/10/21 1845  piperacillin-tazobactam (ZOSYN) IVPB 3.375 g        3.375 g 100 mL/hr over 30 Minutes Intravenous  Once 04/10/21 1836 04/10/21 2121        Radiology Reports  CARDIAC CATHETERIZATION  Result Date: 04/14/2021  Ost RCA to Prox RCA lesion is 100% stenosed.  Prox Cx lesion is 95% stenosed.  Prox LAD to Mid LAD lesion is 100% stenosed.  Dist LAD lesion is 100% stenosed.  Findings: 1. LM ok 2. LAD 100% prox 3. LCX 95% prox 4. RCA 100% prox 5. LIMA to LAD widely patent but LAD is occluded immediately after LIMA plugs in 6. Sequential SVG to OM1 & OM2. Tortuous graft with mild narrowing at insertion to OM1 but otherwise OK. OMs are small 7. Elevated filling pressures with Ao 178/71 (111) LVEDP 36 Assessment: Severe 3v CAD with patent grafts but poor runoff into heavily diseased native circulation. Plan/Discussion: Continue medical therapy. No other options for revascularization. Glori Bickers, MD 5:18 PM   PERIPHERAL VASCULAR CATHETERIZATION  Result Date: 04/14/2021 Procedure: Abdominal aortogram with bilateral lower extremity runoff, third order catheterization left superficial femoral artery  ultrasound right groin Preoperative diagnosis: Nonhealing wound left foot Postoperative diagnosis: Same Anesthesia: Local with IV sedation Operative findings: 1.  Occlusion left popliteal artery with reconstitution of the below-knee popliteal artery and one-vessel posterior tibial runoff severe vessel calcification diffusely 2.  Occlusion right popliteal artery with reconstitution of the posterior tibial artery one-vessel runoff to the right foot Operative details: After obtaining informed consent, the patient was taken the [place] lab.  The patient was placed in supine position on the angio table.  Both groins were prepped and draped in usual sterile fashion.  Local anesthesia was infiltrated over the right common femoral artery.  Ultrasound was used to identify the right common femoral artery and femoral bifurcation.  An introducer needle was used to cannulate the right common femoral artery and an 035 versa core wire threaded in the abdominal aorta under fluoroscopic guidance.  A 5 French sheath was then placed over the guidewire and this was thoroughly flushed heparinized saline.  A 5 French pigtail catheter was then advanced over the guidewire to the abdominal aorta.  Abdominal aortogram was obtained in AP projection.  Left renal artery is patent.  Portions of the right renal artery that were visualized were diseased but patent.  At this was not fully visualized since the patient is end-stage renal disease.  The infrarenal abdominal aorta is calcified throughout its wall as well as  the left and right external common and internal iliac arteries but they are patent. Next bilateral lower extremity runoff views were obtained through the pigtail catheter.  In the left lower extremity the left common femoral artery profunda femoris artery and superficial femoral arteries are patent.  There again heavily calcified.  There was poor visualization of the vessels distally.  Therefore the pigtail catheter was removed over  guidewire and swapped out for a 5 [place] crossover catheter.  An 035 angled Glidewire was then advanced through this all the way down to the mid left superficial femoral artery.  Due to the steep aortic bifurcation an 035 [place] cross was used to go up and over the aortic bifurcation and this was advanced into the distal left superficial femoral artery.  Left lower extremity arteriogram was then obtained.  Left popliteal artery is occluded with diffuse calcific plaque extending from above the knee across the knee joint to the below-knee popliteal artery.  The more distal below-knee popliteal artery is patent without significant stenosis but is definitely calcified in the wall.  The anterior tibial artery and peroneal arteries are occluded.  The posterior tibial artery is the sole runoff vessel to the right foot.  Again this is diffusely calcified but it is patent all the way into the foot. At this point the Ferd Hibbs cross was removed and additional views of the right lower extremity were obtained through the sheath in the right groin.  The right common femoral profunda femoris is patent.  The right superficial femoral artery is patent.  The right popliteal artery is occluded essentially throughout its course.  The intertibial artery is occluded.  The peroneal artery is occluded.  The posterior tibial artery does reconstitute via collaterals and the sole runoff vessel to the right foot.  It is heavily calcified. At this point Dr. Haroldine Laws proceeded with the cardiac catheterization portion of the case and this is dictated as a separate operative note. Operative management: Patient has fairly heavily calcified arteries but best operation for improved perfusion of his foot is most likely going to be above-knee to below-knee popliteal bypass if his vessels are deemed to heavily calcified for grafting or if he is a high cardiac risk and potentially he would be a candidate for atherectomy of his popliteal artery plus minus  stenting.  However, I think this would have a less durable result. Ruta Hinds, MD [facility] Office: [phone]  VAS [place] LOWER EXTREMITY SAPHENOUS VEIN MAPPING  Result Date: 04/16/2021 LOWER EXTREMITY VEIN MAPPING Patient Name:  Maxwell Harmon  Date of Exam:   04/16/2021 Medical Rec #: [id number]        Accession #:    [id number] Date of Birth: 1956/05/30        Patient Gender: M Patient Age:   65Y Exam Location:  [facility] Procedure:      VAS [place] LOWER EXTREMITY SAPHENOUS VEIN MAPPING Referring Phys: [id number] Yevonne Aline HAWKEN --------------------------------------------------------------------------------  Indications:        Pre-op, and PAD Risk Factors:       Hypertension, Diabetes. Other Risk Factors: ESRD.  Limitations: Bilateral GSV harvesting for CABG 2021 Comparison Study: No prior study Performing Technologist: Sharion Dove RVS  Examination Guidelines: A complete evaluation includes B-mode imaging, spectral Doppler, color Doppler, and power Doppler as needed of all accessible portions of each vessel. Bilateral testing is considered an integral part of a complete examination. Limited examinations for reoccurring indications may be performed as noted. +--------------+-------------+--------------------+-------------+--------------+  RT Diameter   RT Findings         GSV          LT Diameter  LT Findings        (cm)                                          (cm)                    +--------------+-------------+--------------------+-------------+--------------+                    not        Saphenofemoral                not visualized                visualized        Junction                                  +--------------+-------------+--------------------+-------------+--------------+                    not        Proximal thigh       0.25                                   visualized                                                   +--------------+-------------+--------------------+-------------+--------------+                    not          Mid thigh          0.13                                   visualized                                                  +--------------+-------------+--------------------+-------------+--------------+                    not         Distal thigh                 not visualized                visualized                                                  +--------------+-------------+--------------------+-------------+--------------+                    not             Knee  0.19                                   visualized                                                  +--------------+-------------+--------------------+-------------+--------------+      0.24       branching       Prox calf          0.22                    +--------------+-------------+--------------------+-------------+--------------+      0.17                        Mid calf          0.20                    +--------------+-------------+--------------------+-------------+--------------+      0.26       branching      Distal calf         0.24                    +--------------+-------------+--------------------+-------------+--------------+      0.23       branching         Ankle            0.22       branching    +--------------+-------------+--------------------+-------------+--------------+ +----------------+-----------+---------------+----------------+--------------+ RT diameter (cm)RT Findings      SSV      LT Diameter (cm) LT Findings   +----------------+-----------+---------------+----------------+--------------+       0.15                 Popliteal fossa                not visualized +----------------+-----------+---------------+----------------+--------------+       0.19                  Proximal calf       0.18                      +----------------+-----------+---------------+----------------+--------------+       0.18                    Mid calf          0.21                     +----------------+-----------+---------------+----------------+--------------+       0.13                   Distal calf                  not visualized +----------------+-----------+---------------+----------------+--------------+    Preliminary    VAS [place] UPPER EXT VEIN MAPPING (PRE-OP AVF)  Result Date: 04/16/2021 UPPER EXTREMITY VEIN MAPPING Patient Name:  EASON HOUSMAN  Date of Exam:   04/16/2021 Medical Rec #: [id number]        Accession #:    [id number] Date of Birth: 1956/08/28        Patient Gender: M Patient Age:   65Y Exam Location:  [facility] Procedure:  VAS [place] UPPER EXT VEIN MAPPING (PRE-OP AVF) Referring Phys: [id number] Yevonne Aline HAWKEN --------------------------------------------------------------------------------  Indications: History of PAD; patient is pre-operative for bypass. History: ESRD (left arm dialysis access), HTN, DM, CAD, status post CABG.  Limitations: Multiple IVs in right arm, left arm dialysis access Comparison Study: No prior study on file Performing Technologist: Sharion Dove RVS  Examination Guidelines: A complete evaluation includes B-mode imaging, spectral Doppler, color Doppler, and power Doppler as needed of all accessible portions of each vessel. Bilateral testing is considered an integral part of a complete examination. Limited examinations for reoccurring indications may be performed as noted. +-----------------+-------------+----------+--------------+ Right Cephalic   Diameter (cm)Depth (cm)   Findings    +-----------------+-------------+----------+--------------+ Prox upper arm       0.13        0.34                  +-----------------+-------------+----------+--------------+ Mid upper arm        0.17        0.24                   +-----------------+-------------+----------+--------------+ Dist upper arm       0.15        0.26                  +-----------------+-------------+----------+--------------+ Antecubital fossa    0.20        1.84                  +-----------------+-------------+----------+--------------+ Prox forearm         0.22        2.16                  +-----------------+-------------+----------+--------------+ Mid forearm                             not visualized +-----------------+-------------+----------+--------------+ Dist forearm                            not visualized +-----------------+-------------+----------+--------------+ Wrist                                   not visualized +-----------------+-------------+----------+--------------+ +-----------------+-------------+----------+----------------+ Right Basilic    Diameter (cm)Depth (cm)    Findings     +-----------------+-------------+----------+----------------+ Prox upper arm       0.70        0.89                    +-----------------+-------------+----------+----------------+ Mid upper arm        0.66        1.15                    +-----------------+-------------+----------+----------------+ Dist upper arm       0.58        1.17                    +-----------------+-------------+----------+----------------+ Antecubital fossa    0.57        1.02                    +-----------------+-------------+----------+----------------+ Prox forearm  not visualized  +-----------------+-------------+----------+----------------+ Mid forearm          0.50        0.36   IV noted in vein +-----------------+-------------+----------+----------------+ Distal forearm       0.50        0.32   IV noted in vein +-----------------+-------------+----------+----------------+ Wrist                                    not visualized   +-----------------+-------------+----------+----------------+ Left arm not mapped secondary to dialysis access *See table(s) above for measurements and observations.  Diagnosing physician:    Preliminary        DVT prophylaxis: Heparin  Code Status: Full code  Family Communication: No family at bedside   Consultants: Cardiology Vascular surgery PCCM  Procedures:     Objective    Physical Examination:  General-appears in no acute distress Heart-S1-S2, regular, no murmur auscultated Lungs-clear to auscultation bilaterally, no wheezing or crackles auscultated Abdomen-soft, nontender, no organomegaly Extremities-no edema in the lower extremities Neuro-alert, oriented x3, no focal deficit noted   Status is: Inpatient  Dispo: The patient is from: Home              Anticipated d/c is to: Home              Anticipated d/c date is: 04/16/2021              Patient currently not stable for discharge  Barrier to discharge-awaiting heart catheterization  COVID-19 Labs  No results for input(s): DDIMER, FERRITIN, LDH, CRP in the last 72 hours.   Lab Results  Component Value Date   [place] NEGATIVE 04/10/2021    Microbiology  Recent Results (from the past 240 hour(s))  Resp Panel by RT-PCR (Flu A&B, Covid) Nasopharyngeal Swab     Status: None   Collection Time: 04/10/21 11:50 AM   Specimen: Nasopharyngeal Swab; Nasopharyngeal(NP) swabs in vial transport medium  Result Value Ref Range Status   SARS Coronavirus 2 by RT PCR NEGATIVE NEGATIVE Final    Comment: (NOTE) SARS-CoV-2 target nucleic acids are NOT DETECTED.  The SARS-CoV-2 RNA is generally detectable in upper respiratory specimens during the acute phase of infection. The lowest concentration of SARS-CoV-2 viral copies this assay can detect is 138 copies/mL. A negative result does not preclude SARS-Cov-2 infection and should not be used as the sole basis for treatment or other patient management decisions.  A negative result may occur with  improper specimen collection/handling, submission of specimen other than nasopharyngeal swab, presence of viral mutation(s) within the areas targeted by this assay, and inadequate number of viral copies(<138 copies/mL). A negative result must be combined with clinical observations, patient history, and epidemiological information. The expected result is Negative.  Fact Sheet for Patients:  EntrepreneurPulse.com.au  Fact Sheet for Healthcare Providers:  IncredibleEmployment.be  This test is no t yet approved or cleared by the [place] FDA and  has been authorized for detection and/or diagnosis of SARS-CoV-2 by FDA under an Emergency Use Authorization (EUA). This EUA will remain  in effect (meaning this test can be used) for the duration of the COVID-19 declaration under Section 564(b)(1) of the Act, 21 U.S.C.section 360bbb-3(b)(1), unless the authorization is terminated  or revoked sooner.       Influenza A by PCR NEGATIVE NEGATIVE Final   Influenza B by PCR NEGATIVE NEGATIVE Final    Comment: (NOTE) The Xpert  Xpress SARS-CoV-2/FLU/RSV plus assay is intended as an aid in the diagnosis of influenza from Nasopharyngeal swab specimens and should not be used as a sole basis for treatment. Nasal washings and aspirates are unacceptable for Xpert Xpress SARS-CoV-2/FLU/RSV testing.  Fact Sheet for Patients: EntrepreneurPulse.com.au  Fact Sheet for Healthcare Providers: IncredibleEmployment.be  This test is not yet approved or cleared by the [place] FDA and has been authorized for detection and/or diagnosis of SARS-CoV-2 by FDA under an Emergency Use Authorization (EUA). This EUA will remain in effect (meaning this test can be used) for the duration of the COVID-19 declaration under Section 564(b)(1) of the Act, 21 U.S.C. section 360bbb-3(b)(1), unless the authorization  is terminated or revoked.  Performed at [facility], [place] [address], [place], [place] [address]   Culture, blood (routine x 2)     Status: None   Collection Time: 04/10/21 10:24 PM   Specimen: BLOOD  Result Value Ref Range Status   Specimen Description BLOOD SITE NOT SPECIFIED  Final   Special Requests   Final    BOTTLES DRAWN AEROBIC AND ANAEROBIC Blood Culture adequate volume   Culture   Final    NO GROWTH 5 DAYS Performed at [facility], [address], [place], [place] [address]    Report Status 04/16/2021 FINAL  Final  Culture, blood (routine x 2)     Status: None   Collection Time: 04/10/21 10:24 PM   Specimen: BLOOD  Result Value Ref Range Status   Specimen Description BLOOD SITE NOT SPECIFIED  Final   Special Requests   Final    BOTTLES DRAWN AEROBIC AND ANAEROBIC Blood Culture adequate volume   Culture   Final    NO GROWTH 5 DAYS Performed at [facility], [address], [place], [place] [address]    Report Status 04/16/2021 FINAL  Final  MRSA PCR Screening     Status: None   Collection Time: 04/11/21 10:16 AM   Specimen: Nasal Mucosa; Nasopharyngeal  Result Value Ref Range Status   MRSA by PCR NEGATIVE NEGATIVE Final    Comment:        The GeneXpert MRSA Assay (FDA approved for NASAL specimens only), is one component of a comprehensive MRSA colonization surveillance program. It is not intended to diagnose MRSA infection nor to guide or monitor treatment for MRSA infections. Performed at [facility], [place] [address] [place], [place] [address]     Pressure Injury 04/10/21 Heel Left;Lateral Stage 3 -  Full thickness tissue loss. Subcutaneous fat may be visible but bone, tendon or muscle are NOT exposed. (Active)  04/10/21 2200  Location: Heel  Location Orientation: Left;Lateral  Staging: Stage 3 -  Full thickness tissue loss. Subcutaneous fat may be visible but bone, tendon or muscle are NOT exposed.  Wound Description  (Comments):   Present on Admission: Yes     Pressure Injury 04/10/21 Toe (Comment  which one) Anterior;Left Stage 2 -  Partial thickness loss of dermis presenting as a shallow open injury with a red, pink wound bed without slough. (Active)  04/10/21 2200  Location: Toe (Comment  which one) (dorsal great toe)  Location Orientation: Anterior;Left  Staging: Stage 2 -  Partial thickness loss of dermis presenting as a shallow open injury with a red, pink wound bed without slough.  Wound Description (Comments):   Present on Admission: Yes          Chue Berkovich S [place]   [facility] If 7PM-7AM, please contact night-coverage at [email or web address], Office  [phone]   04/16/2021, 1:38 PM  LOS: 6 days

## 2021-04-16 NOTE — Progress Notes (Signed)
VASCULAR LAB    Upper extremity vein mapping has been performed.  See CV proc for preliminary results.   Dequavius Kuhner, RVT 04/16/2021, 9:12 AM

## 2021-04-16 NOTE — Progress Notes (Signed)
VASCULAR LAB    Bilateral lower extremity vein mapping has been performed.  See CV proc for preliminary results.   Rutherford Alarie, RVT 04/16/2021, 9:11 AM

## 2021-04-16 NOTE — Plan of Care (Signed)
  Problem: Health Behavior/Discharge Planning: Goal: Ability to manage health-related needs will improve Outcome: Progressing   Problem: Clinical Measurements: Goal: Ability to maintain clinical measurements within normal limits will improve Outcome: Progressing Goal: Will remain free from infection Outcome: Progressing   

## 2021-04-16 NOTE — Progress Notes (Signed)
CBG 65. Patient asymptomatic. Gave juice and crackers, now 77.

## 2021-04-16 NOTE — Consult Note (Signed)
[facility] Nurse Consult Note: Patient receiving care in [place] This patient is being followed by a podiatrist at [facility] and has been seen this admission by [facility]. Will follow with the same treatment given by the podiatrist Erline Hau, DPM and allow [facility] to determine next plan of care.  Reason for Consult: Wound on left heel and left great toe.  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  Pressure Injury POA: NA Measurement: See flow sheet Wound bed: Red/black with yellow border and surrounding erythema. Dressing procedure/placement/frequency: Clean the left heel with soap and water, rinse and pat dry. Apply Santyl to the left heel in a nickel thick layer. Cover with a saline moistened gauze, then dry gauze and wrap the foot with a Kerlix. Change daily.  Clean the left great toe with soap and water, dry and apply a small amount of bactroban over the wound, cover with a 2 x 2 and secure with foam dressing. Change daily.   Monitor the wound area(s) for worsening of condition such as: Signs/symptoms of infection, increase in size, development of or worsening of odor, development of pain, or increased pain at the affected locations.   Notify the medical team if any of these develop.  Thank you for the consult. [place] nurse will not follow at this time.   Please re-consult the [facility] team if needed.  Cathlean Marseilles Tamala Julian, MSN, RN, [place], Lysle Pearl, [facility] Wound Treatment Associate Pager [phone]

## 2021-04-16 NOTE — Progress Notes (Signed)
Patient ID: Maxwell Harmon, male   DOB: 08-15-1956, 65 y.o.   MRN: [id number] S: No new complaints.  O:BP (!) 146/75 (BP Location: Right Arm)   Pulse 87   Temp 98.3 F (36.8 C)   Resp 18   Ht 5\' 11"  (1.803 m)   Wt 88.3 kg   SpO2 100%   BMI 27.15 kg/m   Intake/Output Summary (Last 24 hours) at 04/16/2021 1120 Last data filed at 04/16/2021 0950 Gross per 24 hour  Intake 1134.45 ml  Output 3033 ml  Net -1898.55 ml   Intake/Output: I/O last 3 completed shifts: In: 1757.8 [P.O.:1440; I.V.:44.5; IV Piggyback:273.3] Out: 3032 [Urine:31; Other:3000; Stool:1]  Intake/Output this shift:  Total I/O In: 340 [P.O.:340] Out: 1 [Stool:1] Weight change: -2 kg Gen:NAD CVS:RRR Resp:cta Abd: benign, PD catheter in place with dressing Ext: no edema, LUE AVF +T/B  Recent Labs  Lab 04/10/21 1725 04/10/21 2118 04/10/21 2131 04/10/21 2224 04/11/21 0114 04/12/21 0224 04/13/21 0257 04/14/21 0340 04/14/21 1836 04/15/21 0330 04/16/21 0231  NA  --  136  --   --  135 132* 134* 136 135 135 135  K  --  4.3  --   --  3.0* 4.3 3.9 4.7 3.9 4.0 3.8  CL  --  97*  --   --  98 95* 97* 98 97* 97* 99  CO2  --  25  --   --  26 25 27 26 25 25 27   GLUCOSE  --  57*  --   --  101* 336* 180* 120* 114* 95 100*  BUN  --  87*  --   --  33* 47* 33* 50* 24* 31* 26*  CREATININE  --  10.25*  --   --  4.44* 6.22* 5.33* 6.80* 4.49*  4.56* 5.16* 4.31*  ALBUMIN 2.3* 2.4* 2.3* 2.2*  --  2.4*  --   --  2.2*  --   --   CALCIUM  --  8.1*  --   --  8.2* 8.2* 8.3* 8.5* 8.2* 8.1* 8.2*  PHOS  --  8.1*  --   --  3.5 6.1*  --   --  3.7  --   --   AST 23  --  29 23  --   --   --   --   --   --   --   ALT 15  --  14 15  --   --   --   --   --   --   --    Liver Function Tests: Recent Labs  Lab 04/10/21 1725 04/10/21 2118 04/10/21 2131 04/10/21 2224 04/12/21 0224 04/14/21 1836  AST 23  --  29 23  --   --   ALT 15  --  14 15  --   --   ALKPHOS 86  --  79 85  --   --   BILITOT 0.7  --  1.0 1.0  --   --   PROT 5.5*   --  5.7* 5.2*  --   --   ALBUMIN 2.3*   < > 2.3* 2.2* 2.4* 2.2*   < > = values in this interval not displayed.   Recent Labs  Lab 04/10/21 2131  LIPASE 25  AMYLASE 27*   No results for input(s): AMMONIA in the last 168 hours. CBC: Recent Labs  Lab 04/12/21 0224 04/13/21 0257 04/14/21 0340 04/14/21 1836 04/15/21 0330  WBC 4.1 4.0 6.3 6.3 7.0  HGB 9.3* 8.8* 9.1* 9.3* 8.8*  HCT 30.6* 28.2* 28.8* 29.9* 28.0*  MCV 100.0 98.6 98.6 98.0 96.9  PLT 109* 108* 115* 114* 127*   Cardiac Enzymes: No results for input(s): CKTOTAL, CKMB, CKMBINDEX, TROPONINI in the last 168 hours. CBG: Recent Labs  Lab 04/15/21 1707 04/15/21 2034 04/16/21 0509 04/16/21 0549 04/16/21 1112  GLUCAP 167* 150* 65* 76 107*    Iron Studies: No results for input(s): IRON, TIBC, TRANSFERRIN, FERRITIN in the last 72 hours. Studies/Results: CARDIAC CATHETERIZATION  Result Date: 04/14/2021  Ost RCA to Prox RCA lesion is 100% stenosed.  Prox Cx lesion is 95% stenosed.  Prox LAD to Mid LAD lesion is 100% stenosed.  Dist LAD lesion is 100% stenosed.  Findings: 1. LM ok 2. LAD 100% prox 3. LCX 95% prox 4. RCA 100% prox 5. LIMA to LAD widely patent but LAD is occluded immediately after LIMA plugs in 6. Sequential SVG to OM1 & OM2. Tortuous graft with mild narrowing at insertion to OM1 but otherwise OK. OMs are small 7. Elevated filling pressures with Ao 178/71 (111) LVEDP 36 Assessment: Severe 3v CAD with patent grafts but poor runoff into heavily diseased native circulation. Plan/Discussion: Continue medical therapy. No other options for revascularization. Glori Bickers, MD 5:18 PM   PERIPHERAL VASCULAR CATHETERIZATION  Result Date: 04/14/2021 Procedure: Abdominal aortogram with bilateral lower extremity runoff, third order catheterization left superficial femoral artery ultrasound right groin Preoperative diagnosis: Nonhealing wound left foot Postoperative diagnosis: Same Anesthesia: Local with IV sedation  Operative findings: 1.  Occlusion left popliteal artery with reconstitution of the below-knee popliteal artery and one-vessel posterior tibial runoff severe vessel calcification diffusely 2.  Occlusion right popliteal artery with reconstitution of the posterior tibial artery one-vessel runoff to the right foot Operative details: After obtaining informed consent, the patient was taken the [place] lab.  The patient was placed in supine position on the angio table.  Both groins were prepped and draped in usual sterile fashion.  Local anesthesia was infiltrated over the right common femoral artery.  Ultrasound was used to identify the right common femoral artery and femoral bifurcation.  An introducer needle was used to cannulate the right common femoral artery and an 035 versa core wire threaded in the abdominal aorta under fluoroscopic guidance.  A 5 French sheath was then placed over the guidewire and this was thoroughly flushed heparinized saline.  A 5 French pigtail catheter was then advanced over the guidewire to the abdominal aorta.  Abdominal aortogram was obtained in AP projection.  Left renal artery is patent.  Portions of the right renal artery that were visualized were diseased but patent.  At this was not fully visualized since the patient is end-stage renal disease.  The infrarenal abdominal aorta is calcified throughout its wall as well as the left and right external common and internal iliac arteries but they are patent. Next bilateral lower extremity runoff views were obtained through the pigtail catheter.  In the left lower extremity the left common femoral artery profunda femoris artery and superficial femoral arteries are patent.  There again heavily calcified.  There was poor visualization of the vessels distally.  Therefore the pigtail catheter was removed over guidewire and swapped out for a 5 [place] crossover catheter.  An 035 angled Glidewire was then advanced through this all the way down to the mid  left superficial femoral artery.  Due to the steep aortic bifurcation an 035 Nava cross was used to go up and over the aortic  bifurcation and this was advanced into the distal left superficial femoral artery.  Left lower extremity arteriogram was then obtained.  Left popliteal artery is occluded with diffuse calcific plaque extending from above the knee across the knee joint to the below-knee popliteal artery.  The more distal below-knee popliteal artery is patent without significant stenosis but is definitely calcified in the wall.  The anterior tibial artery and peroneal arteries are occluded.  The posterior tibial artery is the sole runoff vessel to the right foot.  Again this is diffusely calcified but it is patent all the way into the foot. At this point the Ferd Hibbs cross was removed and additional views of the right lower extremity were obtained through the sheath in the right groin.  The right common femoral profunda femoris is patent.  The right superficial femoral artery is patent.  The right popliteal artery is occluded essentially throughout its course.  The intertibial artery is occluded.  The peroneal artery is occluded.  The posterior tibial artery does reconstitute via collaterals and the sole runoff vessel to the right foot.  It is heavily calcified. At this point Dr. Haroldine Laws proceeded with the cardiac catheterization portion of the case and this is dictated as a separate operative note. Operative management: Patient has fairly heavily calcified arteries but best operation for improved perfusion of his foot is most likely going to be above-knee to below-knee popliteal bypass if his vessels are deemed to heavily calcified for grafting or if he is a high cardiac risk and potentially he would be a candidate for atherectomy of his popliteal artery plus minus stenting.  However, I think this would have a less durable result. Ruta Hinds, MD [facility] Office:  [phone]  VAS [place] LOWER EXTREMITY SAPHENOUS VEIN MAPPING  Result Date: 04/16/2021 LOWER EXTREMITY VEIN MAPPING Patient Name:  KAYLUB DETIENNE  Date of Exam:   04/16/2021 Medical Rec #: [id number]        Accession #:    [id number] Date of Birth: 06-Mar-1956        Patient Gender: M Patient Age:   65Y Exam Location:  [facility] Procedure:      VAS [place] LOWER EXTREMITY SAPHENOUS VEIN MAPPING Referring Phys: [id number] Yevonne Aline HAWKEN --------------------------------------------------------------------------------  Indications:        Pre-op, and PAD Risk Factors:       Hypertension, Diabetes. Other Risk Factors: ESRD.  Limitations: Bilateral GSV harvesting for CABG 2021 Comparison Study: No prior study Performing Technologist: Sharion Dove RVS  Examination Guidelines: A complete evaluation includes B-mode imaging, spectral Doppler, color Doppler, and power Doppler as needed of all accessible portions of each vessel. Bilateral testing is considered an integral part of a complete examination. Limited examinations for reoccurring indications may be performed as noted. +--------------+-------------+--------------------+-------------+--------------+  RT Diameter   RT Findings         GSV          LT Diameter  LT Findings        (cm)                                          (cm)                    +--------------+-------------+--------------------+-------------+--------------+  not        Saphenofemoral                not visualized                visualized        Junction                                  +--------------+-------------+--------------------+-------------+--------------+                    not        Proximal thigh       0.25                                   visualized                                                  +--------------+-------------+--------------------+-------------+--------------+                    not          Mid thigh           0.13                                   visualized                                                  +--------------+-------------+--------------------+-------------+--------------+                    not         Distal thigh                 not visualized                visualized                                                  +--------------+-------------+--------------------+-------------+--------------+                    not             Knee            0.19                                   visualized                                                  +--------------+-------------+--------------------+-------------+--------------+      0.24       branching       Prox calf  0.22                    +--------------+-------------+--------------------+-------------+--------------+      0.17                        Mid calf          0.20                    +--------------+-------------+--------------------+-------------+--------------+      0.26       branching      Distal calf         0.24                    +--------------+-------------+--------------------+-------------+--------------+      0.23       branching         Ankle            0.22       branching    +--------------+-------------+--------------------+-------------+--------------+ +----------------+-----------+---------------+----------------+--------------+ RT diameter (cm)RT Findings      SSV      LT Diameter (cm) LT Findings   +----------------+-----------+---------------+----------------+--------------+       0.15                 Popliteal fossa                not visualized +----------------+-----------+---------------+----------------+--------------+       0.19                  Proximal calf       0.18                     +----------------+-----------+---------------+----------------+--------------+       0.18                    Mid calf          0.21                      +----------------+-----------+---------------+----------------+--------------+       0.13                   Distal calf                  not visualized +----------------+-----------+---------------+----------------+--------------+    Preliminary    VAS [place] UPPER EXT VEIN MAPPING (PRE-OP AVF)  Result Date: 04/16/2021 UPPER EXTREMITY VEIN MAPPING Patient Name:  LARKIN MORELOS  Date of Exam:   04/16/2021 Medical Rec #: [id number]        Accession #:    [id number] Date of Birth: 22-Jun-1956        Patient Gender: M Patient Age:   56Y Exam Location:  [facility] Procedure:      VAS [place] UPPER EXT VEIN MAPPING (PRE-OP AVF) Referring Phys: [id number] Yevonne Aline HAWKEN --------------------------------------------------------------------------------  Indications: History of PAD; patient is pre-operative for bypass. History: ESRD (left arm dialysis access), HTN, DM, CAD, status post CABG.  Limitations: Multiple IVs in right arm, left arm dialysis access Comparison Study: No prior study on file Performing Technologist: Sharion Dove RVS  Examination Guidelines: A complete evaluation includes B-mode imaging, spectral Doppler, color Doppler, and power Doppler as needed of all accessible portions of each vessel. Bilateral testing is considered an integral part of a complete examination. Limited examinations for reoccurring indications may be performed as noted. +-----------------+-------------+----------+--------------+ Right Cephalic   Diameter (  cm)Depth (cm)   Findings    +-----------------+-------------+----------+--------------+ Prox upper arm       0.13        0.34                  +-----------------+-------------+----------+--------------+ Mid upper arm        0.17        0.24                  +-----------------+-------------+----------+--------------+ Dist upper arm       0.15        0.26                  +-----------------+-------------+----------+--------------+ Antecubital fossa     0.20        1.84                  +-----------------+-------------+----------+--------------+ Prox forearm         0.22        2.16                  +-----------------+-------------+----------+--------------+ Mid forearm                             not visualized +-----------------+-------------+----------+--------------+ Dist forearm                            not visualized +-----------------+-------------+----------+--------------+ Wrist                                   not visualized +-----------------+-------------+----------+--------------+ +-----------------+-------------+----------+----------------+ Right Basilic    Diameter (cm)Depth (cm)    Findings     +-----------------+-------------+----------+----------------+ Prox upper arm       0.70        0.89                    +-----------------+-------------+----------+----------------+ Mid upper arm        0.66        1.15                    +-----------------+-------------+----------+----------------+ Dist upper arm       0.58        1.17                    +-----------------+-------------+----------+----------------+ Antecubital fossa    0.57        1.02                    +-----------------+-------------+----------+----------------+ Prox forearm                             not visualized  +-----------------+-------------+----------+----------------+ Mid forearm          0.50        0.36   IV noted in vein +-----------------+-------------+----------+----------------+ Distal forearm       0.50        0.32   IV noted in vein +-----------------+-------------+----------+----------------+ Wrist                                    not visualized  +-----------------+-------------+----------+----------------+ Left arm not mapped secondary to dialysis access *See table(s) above for measurements and observations.  Diagnosing  physician:    Preliminary     atorvastatin  80 mg Oral Daily    Chlorhexidine Gluconate Cloth  6 each Topical Q0600   escitalopram  10 mg Oral Daily   gentamicin cream  1 application Topical Daily   heparin  5,000 Units Subcutaneous Q8H   insulin aspart  0-6 Units Subcutaneous TID WC   insulin glargine  10 Units Subcutaneous QHS   pantoprazole  40 mg Oral Q supper   sodium chloride flush  3 mL Intravenous Q12H   sodium chloride flush  3 mL Intravenous Q12H   vancomycin variable dose per unstable renal function (pharmacist dosing)   Does not apply See admin instructions    BMET    Component Value Date/Time   NA 135 04/16/2021 0231   K 3.8 04/16/2021 0231   CL 99 04/16/2021 0231   CO2 27 04/16/2021 0231   GLUCOSE 100 (H) 04/16/2021 0231   BUN 26 (H) 04/16/2021 0231   CREATININE 4.31 (H) 04/16/2021 0231   CALCIUM 8.2 (L) 04/16/2021 0231   GFRNONAA 15 (L) 04/16/2021 0231   GFRAA 10 (L) 01/11/2019 0523   CBC    Component Value Date/Time   WBC 7.0 04/15/2021 0330   RBC 2.89 (L) 04/15/2021 0330   HGB 8.8 (L) 04/15/2021 0330   HCT 28.0 (L) 04/15/2021 0330   PLT 127 (L) 04/15/2021 0330   MCV 96.9 04/15/2021 0330   MCH 30.4 04/15/2021 0330   MCHC 31.4 04/15/2021 0330   RDW 15.2 04/15/2021 0330    Dialysis Orders: Center: [facility], [place], [place]  on CCPD with 3 exchanges, 2700 ml fill volume, fill time 30 minutes, drain time 30 minutes, dwell time 2 hours.   NEPHROLOGIST: Claris Gladden  LOCATION: [place]  SCHEDULE: M-W-F 2nd Shift  EDW: TBD kg.  KIDNEY:  LITERS PROC: liters/treatment  HD TIME: 4 hrs. 0 min.  ACCESS: L Upper Arm AVF  NEEDLE SIZE: 15 ga.  ANTICOAG: Standard Heparin Per Protocol  BATH: 2.0 K-2.5Ca 35 HCO3  QB: 500 ml/min  QD: Auto Flow Protocol     Assessment/Plan: NSTEMI - Cardiology consulted and has EKG changes.  ECHO with significant decrease in EF from April 2022.  LHC 04/14/21 revealed ost RCA to prox rca 100%, prox Cx 95%, prox LAD to mid LAD is 100%, dist LAD 100% stenosed, LIMA  to LAD widely patent, but LAD occluded immediately after LIMA, elevated filling pressures LVEDP 36.   Plan per cardiology Acute systolic CHF - EF 27-06% (down from 55% in April).  Started on levophed yesterday and tolerated 2 liters UF with HD 04/10/21.  No uF with CCPD but tolerated 2.4 liters with HD yesterday.  Still with some edema so will plan HD with UF today prior to [facility].  Heart failure team consulted.    ESRD -  Normally does CCPD at home but had issues with machine and draining so had sporadic treatments and was set up for IHD day he presented to [facility] ED.  He tolerated HD well last night with UF of 2 liters.   Transitioned to IHD due to inadequate drainage/UF with CCPD (likely catheter malfunction). Plan for HD tomorrow since he is having fempop bypass on Tuesday.  Will keep MWF schedule for now and will need outpatient arrangements in [place] prior to discharge.  Will need to have PD catheter evaluated as an outpatient.     Given miralax daily to help with function of PD cath and cont with  weekly flushes to keep it patent. Will continue with IHD for now and will need to f/u with his primary nephrologist in [place] to decide about resuming CCPD or continue with IHD  Hypertension/volume  - volume overload and plan for HD with UF as above.  ECHO with drop in EF started on levophed with improved BP.  Currently off levophed.  Anemia  - due to ESRD.  Will dose with ESA and transfuse prn.  Metabolic bone disease -  Continue with home meds  Nutrition -  Renal diet, carb modified Vascular access:  LUE AVF +T/B Diabetic heel/foot ulcer - Vascular surgery consulted as well as Dr. Sharol Given.  Currently on vanc/zosyn.  For bypass surgery on Tuesday. Hypokalemia - will replete with IV KCl x 2 runs and follow Hypomagnesemia - repleted with 2 grams IV and follow.   Donetta Potts, MD [employer] [phone]

## 2021-04-17 ENCOUNTER — Encounter (HOSPITAL_COMMUNITY): Payer: Self-pay | Admitting: Internal Medicine

## 2021-04-17 LAB — CBC
HCT: 29.6 % — ABNORMAL LOW (ref 39.0–52.0)
Hemoglobin: 9.1 g/dL — ABNORMAL LOW (ref 13.0–17.0)
MCH: 29.9 pg (ref 26.0–34.0)
MCHC: 30.7 g/dL (ref 30.0–36.0)
MCV: 97.4 fL (ref 80.0–100.0)
Platelets: 144 10*3/uL — ABNORMAL LOW (ref 150–400)
RBC: 3.04 MIL/uL — ABNORMAL LOW (ref 4.22–5.81)
RDW: 15.7 % — ABNORMAL HIGH (ref 11.5–15.5)
WBC: 8.2 10*3/uL (ref 4.0–10.5)
nRBC: 0 % (ref 0.0–0.2)

## 2021-04-17 LAB — BASIC METABOLIC PANEL
Anion gap: 13 (ref 5–15)
BUN: 40 mg/dL — ABNORMAL HIGH (ref 8–23)
CO2: 24 mmol/L (ref 22–32)
Calcium: 8 mg/dL — ABNORMAL LOW (ref 8.9–10.3)
Chloride: 96 mmol/L — ABNORMAL LOW (ref 98–111)
Creatinine, Ser: 5.87 mg/dL — ABNORMAL HIGH (ref 0.61–1.24)
GFR, Estimated: 10 mL/min — ABNORMAL LOW (ref >60)
Glucose, Bld: 144 mg/dL — ABNORMAL HIGH (ref 70–99)
Potassium: 4.2 mmol/L (ref 3.5–5.1)
Sodium: 133 mmol/L — ABNORMAL LOW (ref 135–145)

## 2021-04-17 LAB — GLUCOSE, CAPILLARY
Glucose-Capillary: 139 mg/dL — ABNORMAL HIGH (ref 70–99)
Glucose-Capillary: 117 mg/dL — ABNORMAL HIGH (ref 70–99)
Glucose-Capillary: 188 mg/dL — ABNORMAL HIGH (ref 70–99)
Glucose-Capillary: 187 mg/dL — ABNORMAL HIGH (ref 70–99)

## 2021-04-17 MED ORDER — HEPARIN SODIUM (PORCINE) 1000 UNIT/ML DIALYSIS: 20.0000 [IU]/kg | INTRAMUSCULAR | Status: DC | PRN

## 2021-04-17 MED ORDER — VANCOMYCIN HCL IN DEXTROSE 750-5 MG/150ML-% IV SOLN
750.0000 mg | Freq: Once | INTRAVENOUS | Status: DC
  Filled 2021-04-17 (×2): qty 150

## 2021-04-17 MED ORDER — VANCOMYCIN HCL 750 MG/150ML IV SOLN
INTRAVENOUS | Status: AC
  Administered 2021-04-17: 750 mg
  Filled 2021-04-17: qty 150

## 2021-04-17 MED ORDER — INSULIN ASPART 100 UNIT/ML IJ SOLN
0.0000 [IU] | Freq: Three times a day (TID) | INTRAMUSCULAR | Status: DC
  Administered 2021-04-18 – 2021-04-20 (×3): 1 [IU] via SUBCUTANEOUS

## 2021-04-17 MED FILL — Midazolam HCl Inj 2 MG/2ML (Base Equivalent): INTRAMUSCULAR | Qty: 2 | Status: AC

## 2021-04-17 NOTE — Progress Notes (Signed)
Pharmacy Antibiotic Note  Maxwell Harmon is a 65 y.o. male admitted on 04/10/2021 with sepsis.  Pharmacy has been consulted for Zosyn and vancomycin dosing.  ESRD on peritoneal dialysis at home but recently underwent HD given problems with peritoneal dialysis at home.  HD Received: 6/8, 6/10, 6/11 Next session planned: 6/13, per most recent nephrology note.  Received 500mg  post HD on 6/10. VR on 6/12 resulted at 38mcg/ml.  Plan: -Vancomycin 750 mg IV post HD today -Continue Zosyn 2.25 g IV q8h -Monitor CBC, renal fx, cultures and clinical progress  Height: 5\' 11"  (180.3 cm) Weight: 88.5 kg (195 lb 3.2 oz) IBW/kg (Calculated) : 75.3  Temp (24hrs), Avg:98.1 F (36.7 C), Min:97.6 F (36.4 C), Max:98.6 F (37 C)  Recent Labs  Lab 04/10/21 1819 04/10/21 2118 04/10/21 2224 04/11/21 0114 04/12/21 1206 04/13/21 0257 04/14/21 0340 04/14/21 1836 04/15/21 0330 04/16/21 0231 04/17/21 0159 04/17/21 0702  WBC  --    < >  --    < >  --  4.0 6.3 6.3 7.0  --   --  8.2  CREATININE  --    < >  --    < >  --  5.33* 6.80* 4.49*  4.56* 5.16* 4.31* 5.87*  --   LATICACIDVEN 1.0  --  0.9  --   --   --   --   --   --   --   --   --   VANCORANDOM  --   --   --   --  29  --   --   --   --  24  --   --    < > = values in this interval not displayed.     Estimated Creatinine Clearance: 13.5 mL/min (A) (by C-G formula based on SCr of 5.87 mg/dL (H)).    No Known Allergies  Antimicrobials this admission: Zosyn 6/6 >>  Vancomycin 6/6 >>   Dose adjustments this admission: 6/8 VR 29 6/12 VR 24  Microbiology results: 6/6 BCx: negative x 2   Thank you for involving pharmacy in this patient's care.  Lorelei Pont, PharmD, BCPS Clinical Pharmacist Clinical phone for 04/17/2021 until 3p is [phone] 04/17/2021 9:30 AM  **Pharmacist phone directory can be found on [email or web address] listed under [place]**

## 2021-04-17 NOTE — Evaluation (Signed)
Physical Therapy Evaluation Patient Details Name: Maxwell Harmon MRN: [id number] DOB: 1956/02/26 Today's Date: 04/17/2021   History of Present Illness  Patient is a 65 y/o male who presents on 6/6 with SOB and weakness. Found to have sepsis due to LLE cellulitis, acute encephalopathy due to uremia-started on HD and acute CHF. s/p Abdominal aortogram 6/10 showed bilateral popliteal artery occlusion, LLE with single-vessel PT runoff which is diffusely diseased. Bypass surgery for LLE PAD scheduled for 04/19/21. PMH includes CAD s/p CABG in 2021, preserved EF, Charcot foot, ESRD on peritoneal dialysis, HTN, TIAs.  Clinical Impression  Patient presents with generalized weakness, impaired sensation, pain, impaired balance and impaired mobility s/p above. Pt lives at home with wife who works 3 days/week and reports having difficulty with ADLs/walking PTA. Reports hx of falls at home. Today, pt tolerated transfers and gait training with use of RW and Min A for support. Hx of charcot foot and ulcer on left heel making walking difficult. If family not able to provide necessary supervision at home, might consider [name] pending surgery on Wed and progress.  Encouraged walking to bathroom daily and sitting in chair for all meals. Recommend working with Risk analyst. Will follow acutely to maximize independence and mobility prior to return home.    Follow Up Recommendations Home health PT;Supervision for mobility/OOB (initially for the first week)    Equipment Recommendations  Rolling walker with 5" wheels;3in1 (PT)    Recommendations for Other Services       Precautions / Restrictions Precautions Precautions: Fall;Other (comment) Precaution Comments: charcot foot, ulcer left heel Restrictions Weight Bearing Restrictions: No      Mobility  Bed Mobility               General bed mobility comments: Up in chair upon PT arrival.    Transfers Overall transfer level: Needs assistance Equipment  used: Rolling walker (2 wheeled) Transfers: Sit to/from Stand Sit to Stand: Min assist         General transfer comment: Min A to steady in standing, stood from chair x1 with cues for hand placement- difficulty transitioning UEs from chair to RW.  Ambulation/Gait Ambulation/Gait assistance: Min assist Gait Distance (Feet): 200 Feet Assistive device: Rolling walker (2 wheeled) Gait Pattern/deviations: Step-through pattern;Decreased stride length;Decreased stance time - left Gait velocity: decreased Gait velocity interpretation: <1.31 ft/sec, indicative of household ambulator General Gait Details: Slow, mildly unsteady gait with decreased stance time LLE with some mild left knee instability. 1 standing rest break, difficulty with turns.  Stairs            Wheelchair Mobility    Modified Rankin (Stroke Patients Only)       Balance Overall balance assessment: Needs assistance Sitting-balance support: Feet supported;No upper extremity supported Sitting balance-Leahy Scale: Good     Standing balance support: During functional activity Standing balance-Leahy Scale: Poor Standing balance comment: Requires UE support in standing and Min A when donning mask requiring at least 1 UE support.                             Pertinent Vitals/Pain Pain Assessment: Faces Faces Pain Scale: Hurts even more Pain Location: left heel Pain Descriptors / Indicators: Tender;Sore Pain Intervention(s): Monitored during session;Repositioned;Limited activity within patient's tolerance    Home Living Family/patient expects to be discharged to:: Private residence Living Arrangements: Spouse/significant other Available Help at Discharge: Family;Available PRN/intermittently Type of Home: House Home Access: Stairs to  enter Entrance Stairs-Rails: Right Entrance Stairs-Number of Steps: 2-3 Home Layout: One level Home Equipment: Shower seat;Cane - single point      Prior Function  Level of Independence: Independent with assistive device(s)         Comments: Drives, not lately. Does not do much IADLs. Multiple falls at home.     Hand Dominance   Dominant Hand: Right    Extremity/Trunk Assessment   Upper Extremity Assessment Upper Extremity Assessment: Defer to OT evaluation    Lower Extremity Assessment Lower Extremity Assessment: RLE deficits/detail;LLE deficits/detail;Generalized weakness RLE Deficits / Details: Grossly ~4+/5 throughout RLE Sensation: decreased light touch;decreased proprioception;history of peripheral neuropathy LLE Deficits / Details: Grossly ~4+/5 throughout; but noted to have some knee instability with weight bearing today. Ulcer on left heel. LLE Sensation: decreased light touch;decreased proprioception;history of peripheral neuropathy    Cervical / Trunk Assessment Cervical / Trunk Assessment: Normal  Communication   Communication: No difficulties  Cognition Arousal/Alertness: Awake/alert Behavior During Therapy: Flat affect Overall Cognitive Status: Within Functional Limits for tasks assessed                                 General Comments: Seems to have a saddened mood. Per wife, pt is cognitively intact but has moments of confusion at times at home.      General Comments General comments (skin integrity, edema, etc.): Wife present during session. Pt getting special shoes made for his charcot foot PTA.    Exercises     Assessment/Plan    PT Assessment Patient needs continued PT services  PT Problem List Decreased strength;Decreased mobility;Pain;Impaired sensation;Decreased balance;Decreased knowledge of use of DME;Decreased skin integrity;Decreased activity tolerance       PT Treatment Interventions Therapeutic exercise;DME instruction;Gait training;Stair training;Patient/family education;Therapeutic activities;Functional mobility training;Balance training    PT Goals (Current goals can be found in  the Care Plan section)  Acute Rehab PT Goals Patient Stated Goal: to get better PT Goal Formulation: With patient/family Time For Goal Achievement: 05/01/21 Potential to Achieve Goals: Good    Frequency Min 3X/week   Barriers to discharge Decreased caregiver support      Co-evaluation               AM-PAC PT "6 Clicks" Mobility  Outcome Measure Help needed turning from your back to your side while in a flat bed without using bedrails?: A Little Help needed moving from lying on your back to sitting on the side of a flat bed without using bedrails?: A Little Help needed moving to and from a bed to a chair (including a wheelchair)?: A Little Help needed standing up from a chair using your arms (e.g., wheelchair or bedside chair)?: A Little Help needed to walk in hospital room?: A Little Help needed climbing 3-5 steps with a railing? : A Little 6 Click Score: 18    End of Session Equipment Utilized During Treatment: Gait belt Activity Tolerance: Patient tolerated treatment well Patient left: in chair;with call bell/phone within reach;with family/visitor present Nurse Communication: Mobility status PT Visit Diagnosis: Unsteadiness on feet (R26.81);Muscle weakness (generalized) (M62.81);Difficulty in walking, not elsewhere classified (R26.2)    Time: 2774-1287 PT Time Calculation (min) (ACUTE ONLY): 27 min   Charges:   PT Evaluation $PT Eval Moderate Complexity: 1 Mod PT Treatments $Gait Training: 8-22 mins        Marisa Severin, PT, DPT Acute Rehabilitation Services Pager [phone] Office [phone]  [name] A Elanie Hammitt 04/17/2021, 3:33 PM

## 2021-04-17 NOTE — Progress Notes (Signed)
[facility] Progress Note   Assessment/ Plan:   Dialysis Orders: Center: [facility], [place], [place]  on CCPD with 3 exchanges, 2700 ml fill volume, fill time 30 minutes, drain time 30 minutes, dwell time 2 hours.   NEPHROLOGIST: Claris Gladden  LOCATION: [place]  SCHEDULE: M-W-F 2nd Shift  EDW: TBD kg.  KIDNEY:  LITERS PROC: liters/treatment  HD TIME: 4 hrs. 0 min.  ACCESS: L Upper Arm AVF  NEEDLE SIZE: 15 ga.  ANTICOAG: Standard Heparin Per Protocol  BATH: 2.0 K-2.5Ca 35 HCO3  QB: 500 ml/min  QD: Auto Flow Protocol     Assessment/Plan: NSTEMI - Cardiology consulted and has EKG changes.  ECHO with significant decrease in EF from April 2022.  LHC 04/14/21 revealed ost RCA to prox rca 100%, prox Cx 95%, prox LAD to mid LAD is 100%, dist LAD 100% stenosed, LIMA to LAD widely patent, but LAD occluded immediately after LIMA, elevated filling pressures LVEDP 36.   Plan per cardiology Acute systolic CHF - EF 24-40% (down from 55% in April).  HF consulted  ESRD -  Normally does CCPD at home but had issues with machine and draining so had sporadic treatments and was set up for IHD day he presented [facility] ED.  He tolerated HD well last night with UF of 2 liters.   Transitioned to IHD due to inadequate drainage/UF with CCPD (likely catheter malfunction). Will keep MWF schedule for now and will need outpatient arrangements in [place] prior to discharge.  Will need to have PD catheter evaluated as an outpatient.     Given miralax daily to help with function of PD cath and cont with weekly flushes to keep it patent. Will continue with IHD for now and will need to f/u with his primary nephrologist in [place] to decide about resuming CCPD or continue with IHD Plan for HD tomorrow 6/14 since he will have angio Wednesday  Hypertension/volume  - volume overload and plan for HD with UF as above.  ECHO with drop in EF started on levophed with improved BP.   Currently off levophed.  Anemia  - due to ESRD.  Will dose with ESA and transfuse prn.  Metabolic bone disease -  Continue with home meds  Nutrition -  Renal diet, carb modified Vascular access:  LUE AVF +T/B Diabetic heel/foot ulcer - Vascular surgery consulted as well as Dr. Sharol Given.  Currently on vanc/zosyn.  For angio 6/15 Hypokalemia - will replete with IV KCl x 2 runs and follow Hypomagnesemia - repleted with 2 grams IV and follow.  Dispo: may need SNF    Subjective:    Seen and examined on dialysis.  BFR 400 mL/ min via AVF.  UF goal 3L.  Having angio Wednesday.    Objective:   BP (!) 154/77   Pulse 88   Temp (!) 97.4 F (36.3 C) (Oral)   Resp 19   Ht 5\' 11"  (1.803 m)   Wt 90.1 kg   SpO2 97%   BMI 27.70 kg/m   Physical Exam: Gen: NAD, sitting in bed CVS: RRR Resp: clear Abd: PD cath in L quadrant Ext: minimal edema ACCESS: L AVF + T/B, PD catheter as well  Labs: BMET Recent Labs  Lab 04/10/21 2118 04/11/21 0114 04/12/21 0224 04/13/21 0257 04/14/21 0340 04/14/21 1836 04/15/21 0330 04/16/21 0231 04/17/21 0159  NA 136 135 132* 134* 136 135 135 135 133*  K 4.3 3.0* 4.3 3.9 4.7 3.9 4.0 3.8 4.2  CL 97* 98 95* 97* 98 97* 97* 99 96*  CO2 25 26 25 27 26 25 25 27 24   GLUCOSE 57* 101* 336* 180* 120* 114* 95 100* 144*  BUN 87* 33* 47* 33* 50* 24* 31* 26* 40*  CREATININE 10.25* 4.44* 6.22* 5.33* 6.80* 4.49*  4.56* 5.16* 4.31* 5.87*  CALCIUM 8.1* 8.2* 8.2* 8.3* 8.5* 8.2* 8.1* 8.2* 8.0*  PHOS 8.1* 3.5 6.1*  --   --  3.7  --   --   --    CBC Recent Labs  Lab 04/14/21 0340 04/14/21 1836 04/15/21 0330 04/17/21 0702  WBC 6.3 6.3 7.0 8.2  HGB 9.1* 9.3* 8.8* 9.1*  HCT 28.8* 29.9* 28.0* 29.6*  MCV 98.6 98.0 96.9 97.4  PLT 115* 114* 127* 144*      Medications:     atorvastatin  80 mg Oral Daily   Chlorhexidine Gluconate Cloth  6 each Topical Q0600   collagenase   Topical Daily   escitalopram  10 mg Oral Daily   gentamicin cream  1 application Topical Daily    heparin  5,000 Units Subcutaneous Q8H   insulin aspart  0-6 Units Subcutaneous TID WC   insulin glargine  5 Units Subcutaneous QHS   mupirocin cream   Topical Daily   pantoprazole  40 mg Oral Q supper   sodium chloride flush  3 mL Intravenous Q12H   sodium chloride flush  3 mL Intravenous Q12H   vancomycin variable dose per unstable renal function (pharmacist dosing)   Does not apply See admin instructions     Madelon Lips MD 04/17/2021, 12:16 PM

## 2021-04-17 NOTE — Progress Notes (Signed)
[facility]  PROGRESS NOTE  Maxwell Harmon WFU:[id number] DOB: Feb 22, 1956 DOA: 04/10/2021 PCP: [name], DO   Brief HPI:   65 year old male with history of CAD s/p CABG in 2021, preserved EF, Charcot foot, ESRD on peritoneal dialysis presented with cellulitis of left foot/diabetic foot wound.  Patient was admitted to ICU started on broad-spectrum antibiotic for diabetic foot wound.  Nephrology was consulted and patient was switched to hemodialysis due to PD catheter issues. Patient significantly improved so he was transferred out of ICU  Underwent left heart catheterization yesterday, showed severe multivessel CAD.  Not amenable for further intervention.  Abdominal aortogram showed bilateral popliteal artery occlusion, left lower extremity with single-vessel PT runoff which is diffusely diseased.   Subjective   Patient seen and examined, denies any complaints.  Bypass surgery for left lower extremity PAD scheduled for Tuesday.   Assessment/Plan:     Sepsis due to left lower extremity cellulitis -Sepsis physiology has resolved -Continue vancomycin and Zosyn -Blood cultures x2 are negative to date   Metabolic encephalopathy -Resolved -Likely from above   Left diabetic foot wound/PAD -Dr. Sharol Given was consulted -Vascular surgery has seen the patient; plan to take for aortogram today. -X-ray of left foot tibia and fibula was negative for osteomyelitis -Aortogram showed bilateral popliteal artery occlusions, diffusely diseased posterior tibial artery. -Vascular surgery following, plan for open revascularization on Tuesday.   ESRD -Patient has been on peritoneal dialysis at home -Switched to IHD  -Nephrology following   Acute systolic heart failure -Echocardiogram during this admission showed EF of 30 to 35% -Stress echo from April 2022 showed EF of 55% -Patient currently on HD  -Patient has history of CABG ; cardiac cath showed severe three-vessel disease, patient  graft with poor runoff into heavily diseased native circulation. -Medical management recommended, no options for revascularization   Diabetes mellitus type 2 -CBG better controlled on Lantus 5 units subcu daily -Lantus was changed from 15 units subcu daily to 10 units subcu daily due to hypoglycemia -As patient was still hypoglycemic so dose of Lantus was again changed to 5 units subcu daily.   -Continue very sensitive sliding scale insulin     Scheduled medications:    atorvastatin  80 mg Oral Daily   Chlorhexidine Gluconate Cloth  6 each Topical Q0600   collagenase   Topical Daily   escitalopram  10 mg Oral Daily   gentamicin cream  1 application Topical Daily   heparin  5,000 Units Subcutaneous Q8H   insulin aspart  0-6 Units Subcutaneous TID WC   insulin glargine  5 Units Subcutaneous QHS   mupirocin cream   Topical Daily   pantoprazole  40 mg Oral Q supper   sodium chloride flush  3 mL Intravenous Q12H   sodium chloride flush  3 mL Intravenous Q12H   vancomycin variable dose per unstable renal function (pharmacist dosing)   Does not apply See admin instructions         Data Reviewed:   CBG:  Recent Labs  Lab 04/16/21 1112 04/16/21 1459 04/16/21 1646 04/16/21 2044 04/17/21 0559  GLUCAP 107* 163* 162* 157* 139*    SpO2: 97 % O2 Flow Rate (L/min): 2 L/min    Vitals:   04/17/21 0917 04/17/21 0930 04/17/21 1000 04/17/21 1028  BP: 139/78 (!) 147/81 137/71 (!) 141/79  Pulse: 90 87 84 84  Resp: (!) 22 (!) 22 (!) 22   Temp:      TempSrc:      SpO2:  Weight:      Height:         Intake/Output Summary (Last 24 hours) at 04/17/2021 1042 Last data filed at 04/17/2021 0858 Gross per 24 hour  Intake 745 ml  Output 0 ml  Net 745 ml    06/11 1901 - 06/13 0700 In: 1199.5 [P.O.:1102; I.V.:47.5] Out: 3 [Urine:1]  Filed Weights   04/17/21 0214 04/17/21 0529 04/17/21 0912  Weight: 86.4 kg 88.5 kg 90.1 kg    CBC:  Recent Labs  Lab 04/13/21 0257  04/14/21 0340 04/14/21 1836 04/15/21 0330 04/17/21 0702  WBC 4.0 6.3 6.3 7.0 8.2  HGB 8.8* 9.1* 9.3* 8.8* 9.1*  HCT 28.2* 28.8* 29.9* 28.0* 29.6*  PLT 108* 115* 114* 127* 144*  MCV 98.6 98.6 98.0 96.9 97.4  MCH 30.8 31.2 30.5 30.4 29.9  MCHC 31.2 31.6 31.1 31.4 30.7  RDW 15.3 15.1 15.2 15.2 15.7*    Complete metabolic panel:  Recent Labs  Lab 04/10/21 1640 04/10/21 1725 04/10/21 1725 04/10/21 1819 04/10/21 2118 04/10/21 2131 04/10/21 2132 04/10/21 2224 04/11/21 0114 04/12/21 0224 04/13/21 0257 04/14/21 0340 04/14/21 1836 04/15/21 0330 04/16/21 0231 04/17/21 0159  NA  --   --    < >  --  136  --   --   --  135 132*   < > 136 135 135 135 133*  K  --   --    < >  --  4.3  --   --   --  3.0* 4.3   < > 4.7 3.9 4.0 3.8 4.2  CL  --   --    < >  --  97*  --   --   --  98 95*   < > 98 97* 97* 99 96*  CO2  --   --    < >  --  25  --   --   --  26 25   < > 26 25 25 27 24   GLUCOSE  --   --    < >  --  57*  --   --   --  101* 336*   < > 120* 114* 95 100* 144*  BUN  --   --    < >  --  87*  --   --   --  33* 47*   < > 50* 24* 31* 26* 40*  CREATININE  --   --    < >  --  10.25*  --   --   --  4.44* 6.22*   < > 6.80* 4.49*  4.56* 5.16* 4.31* 5.87*  CALCIUM  --   --    < >  --  8.1*  --   --   --  8.2* 8.2*   < > 8.5* 8.2* 8.1* 8.2* 8.0*  AST  --  23  --   --   --  29  --  23  --   --   --   --   --   --   --   --   ALT  --  15  --   --   --  14  --  15  --   --   --   --   --   --   --   --   ALKPHOS  --  86  --   --   --  79  --  85  --   --   --   --   --   --   --   --  BILITOT  --  0.7  --   --   --  1.0  --  1.0  --   --   --   --   --   --   --   --   ALBUMIN  --  2.3*   < >  --  2.4* 2.3*  --  2.2*  --  2.4*  --   --  2.2*  --   --   --   MG  --   --   --   --   --   --   --   --  1.7 2.1  --   --   --   --   --   --   DDIMER  --   --   --   --   --   --  2.86*  --   --   --   --   --   --   --   --   --   LATICACIDVEN  --   --   --  1.0  --   --   --  0.9  --   --   --    --   --   --   --   --   INR  --   --   --   --   --   --  1.2  --   --   --   --   --   --   --   --   --   HGBA1C 8.2*  --   --   --   --   --   --   --   --   --   --   --   --   --   --   --    < > = values in this interval not displayed.    Recent Labs  Lab 04/10/21 2131  LIPASE 25  AMYLASE 27*    Recent Labs  Lab 04/10/21 1150 04/10/21 2132  DDIMER  --  2.86*  SARSCOV2NAA NEGATIVE  --     ------------------------------------------------------------------------------------------------------------------ No results for input(s): CHOL, HDL, LDLCALC, TRIG, CHOLHDL, LDLDIRECT in the last 72 hours.   Lab Results  Component Value Date   HGBA1C 8.2 (H) 04/10/2021   ------------------------------------------------------------------------------------------------------------------ No results for input(s): TSH, T4TOTAL, T3FREE, THYROIDAB in the last 72 hours.  Invalid input(s): FREET3 ------------------------------------------------------------------------------------------------------------------ No results for input(s): VITAMINB12, FOLATE, FERRITIN, TIBC, IRON, RETICCTPCT in the last 72 hours.  Coagulation profile Recent Labs  Lab 04/10/21 2132  INR 1.2   No results for input(s): DDIMER in the last 72 hours.   Cardiac Enzymes No results for input(s): CKTOTAL, CKMB, CKMBINDEX, TROPONINI in the last 168 hours.  ------------------------------------------------------------------------------------------------------------------    Component Value Date/Time   BNP 4,239.3 (H) 04/10/2021 1033     Antibiotics: Anti-infectives (From admission, onward)    Start     Dose/Rate Route Frequency Ordered Stop   04/17/21 1400  vancomycin (VANCOCIN) IVPB 750 mg/150 ml premix        750 mg 150 mL/hr over 60 Minutes Intravenous  Once 04/17/21 0919     04/14/21 1515  vancomycin (VANCOREADY) IVPB 500 mg/100 mL        500 mg 100 mL/hr over 60 Minutes Intravenous  Once 04/14/21 1428  04/14/21 1852   04/12/21 2000  vancomycin (VANCOREADY) IVPB 750 mg/150 mL        750  mg 150 mL/hr over 60 Minutes Intravenous NOW 04/12/21 1852 04/12/21 2211   04/11/21 1030  vancomycin (VANCOREADY) IVPB 1500 mg/300 mL        1,500 mg 150 mL/hr over 120 Minutes Intravenous  Once 04/11/21 0937 04/11/21 1329   04/11/21 0400  piperacillin-tazobactam (ZOSYN) IVPB 2.25 g        2.25 g 100 mL/hr over 30 Minutes Intravenous Every 8 hours 04/10/21 1951     04/10/21 1951  vancomycin variable dose per unstable renal function (pharmacist dosing)         Does not apply See admin instructions 04/10/21 1951     04/10/21 1900  vancomycin (VANCOREADY) IVPB 1750 mg/350 mL        1,750 mg 175 mL/hr over 120 Minutes Intravenous  Once 04/10/21 1836 04/10/21 2322   04/10/21 1845  piperacillin-tazobactam (ZOSYN) IVPB 3.375 g        3.375 g 100 mL/hr over 30 Minutes Intravenous  Once 04/10/21 1836 04/10/21 2121        Radiology Reports  VAS [place] LOWER EXTREMITY SAPHENOUS VEIN MAPPING  Result Date: 04/16/2021 [place] Patient Name:  Maxwell Harmon  Date of Exam:   04/16/2021 Medical Rec #: [id number]        Accession #:    [id number] Date of Birth: May 05, 1956        Patient Gender: M Patient Age:   65Y Exam Location:  [facility] Procedure:      VAS [place] Referring Phys: [id number] Cherre Robins --------------------------------------------------------------------------------  Indications:        Pre-op, and PAD Risk Factors:       Hypertension, Diabetes. Other Risk Factors: ESRD.  Limitations: Bilateral GSV harvesting for CABG 2021 Comparison Study: No prior study Performing Technologist: Sharion Dove RVS  Examination Guidelines: A complete evaluation includes B-mode imaging, spectral Doppler, color Doppler, and power Doppler as needed of all accessible portions of each vessel. Bilateral testing is considered an integral part of a complete examination.  Limited examinations for reoccurring indications may be performed as noted. +--------------+-------------+--------------------+-------------+--------------+  RT Diameter   RT Findings         GSV          LT Diameter  LT Findings        (cm)                                          (cm)                    +--------------+-------------+--------------------+-------------+--------------+                    not        Saphenofemoral                not visualized                visualized        Junction                                  +--------------+-------------+--------------------+-------------+--------------+                    not        Proximal thigh  0.25                                   visualized                                                  +--------------+-------------+--------------------+-------------+--------------+                    not          Mid thigh          0.13                                   visualized                                                  +--------------+-------------+--------------------+-------------+--------------+                    not         Distal thigh                 not visualized                visualized                                                  +--------------+-------------+--------------------+-------------+--------------+                    not             Knee            0.19                                   visualized                                                  +--------------+-------------+--------------------+-------------+--------------+      0.24       branching       Prox calf          0.22                    +--------------+-------------+--------------------+-------------+--------------+      0.17                        Mid calf          0.20                    +--------------+-------------+--------------------+-------------+--------------+      0.26        branching      Distal calf         0.24                    +--------------+-------------+--------------------+-------------+--------------+  0.23       branching         Ankle            0.22       branching    +--------------+-------------+--------------------+-------------+--------------+ +----------------+-----------+---------------+----------------+--------------+ RT diameter (cm)RT Findings      SSV      LT Diameter (cm) LT Findings   +----------------+-----------+---------------+----------------+--------------+       0.15                 Popliteal fossa                not visualized +----------------+-----------+---------------+----------------+--------------+       0.19                  Proximal calf       0.18                     +----------------+-----------+---------------+----------------+--------------+       0.18                    Mid calf          0.21                     +----------------+-----------+---------------+----------------+--------------+       0.13                   Distal calf                  not visualized +----------------+-----------+---------------+----------------+--------------+    Preliminary    VAS [place] UPPER EXT VEIN MAPPING (PRE-OP AVF)  Result Date: 04/16/2021 [place] MAPPING Patient Name:  Maxwell Harmon  Date of Exam:   04/16/2021 Medical Rec #: [id number]        Accession #:    [id number] Date of Birth: 01-Oct-1956        Patient Gender: M Patient Age:   59Y Exam Location:  [facility] Procedure:      VAS [place] UPPER EXT VEIN MAPPING (PRE-OP AVF) Referring Phys: [id number] Yevonne Aline HAWKEN --------------------------------------------------------------------------------  Indications: History of PAD; patient is pre-operative for bypass. History: ESRD (left arm dialysis access), HTN, DM, CAD, status post CABG.  Limitations: Multiple IVs in right arm, left arm dialysis access Comparison Study: No prior study on  file Performing Technologist: Sharion Dove RVS  Examination Guidelines: A complete evaluation includes B-mode imaging, spectral Doppler, color Doppler, and power Doppler as needed of all accessible portions of each vessel. Bilateral testing is considered an integral part of a complete examination. Limited examinations for reoccurring indications may be performed as noted. +-----------------+-------------+----------+--------------+ Right Cephalic   Diameter (cm)Depth (cm)   Findings    +-----------------+-------------+----------+--------------+ Prox upper arm       0.13        0.34                  +-----------------+-------------+----------+--------------+ Mid upper arm        0.17        0.24                  +-----------------+-------------+----------+--------------+ Dist upper arm       0.15        0.26                  +-----------------+-------------+----------+--------------+ Antecubital fossa    0.20        1.84                  +-----------------+-------------+----------+--------------+  Prox forearm         0.22        2.16                  +-----------------+-------------+----------+--------------+ Mid forearm                             not visualized +-----------------+-------------+----------+--------------+ Dist forearm                            not visualized +-----------------+-------------+----------+--------------+ Wrist                                   not visualized +-----------------+-------------+----------+--------------+ +-----------------+-------------+----------+----------------+ Right Basilic    Diameter (cm)Depth (cm)    Findings     +-----------------+-------------+----------+----------------+ Prox upper arm       0.70        0.89                    +-----------------+-------------+----------+----------------+ Mid upper arm        0.66        1.15                     +-----------------+-------------+----------+----------------+ Dist upper arm       0.58        1.17                    +-----------------+-------------+----------+----------------+ Antecubital fossa    0.57        1.02                    +-----------------+-------------+----------+----------------+ Prox forearm                             not visualized  +-----------------+-------------+----------+----------------+ Mid forearm          0.50        0.36   IV noted in vein +-----------------+-------------+----------+----------------+ Distal forearm       0.50        0.32   IV noted in vein +-----------------+-------------+----------+----------------+ Wrist                                    not visualized  +-----------------+-------------+----------+----------------+ Left arm not mapped secondary to dialysis access *See table(s) above for measurements and observations.  Diagnosing physician:    Preliminary        DVT prophylaxis: Heparin  Code Status: Full code  Family Communication: No family at bedside   Consultants: Cardiology Vascular surgery PCCM  Procedures:     Objective    Physical Examination:  General-appears in no acute distress Heart-S1-S2, regular, no murmur auscultated Lungs-clear to auscultation bilaterally, no wheezing or crackles auscultated Abdomen-soft, nontender, no organomegaly Extremities-no edema in the lower extremities Neuro-alert, oriented x3, no focal deficit noted   Status is: Inpatient  Dispo: The patient is from: Home              Anticipated d/c is to: Home              Anticipated d/c date is: 04/19/2021              Patient currently not stable for discharge  Barrier to discharge-awaiting heart catheterization  COVID-19 Labs  No results for input(s): DDIMER, FERRITIN, LDH, CRP in the last 72 hours.   Lab Results  Component Value Date   [place] NEGATIVE 04/10/2021    Microbiology  Recent Results  (from the past 240 hour(s))  Resp Panel by RT-PCR (Flu A&B, Covid) Nasopharyngeal Swab     Status: None   Collection Time: 04/10/21 11:50 AM   Specimen: Nasopharyngeal Swab; Nasopharyngeal(NP) swabs in vial transport medium  Result Value Ref Range Status   SARS Coronavirus 2 by RT PCR NEGATIVE NEGATIVE Final    Comment: (NOTE) SARS-CoV-2 target nucleic acids are NOT DETECTED.  The SARS-CoV-2 RNA is generally detectable in upper respiratory specimens during the acute phase of infection. The lowest concentration of SARS-CoV-2 viral copies this assay can detect is 138 copies/mL. A negative result does not preclude SARS-Cov-2 infection and should not be used as the sole basis for treatment or other patient management decisions. A negative result may occur with  improper specimen collection/handling, submission of specimen other than nasopharyngeal swab, presence of viral mutation(s) within the areas targeted by this assay, and inadequate number of viral copies(<138 copies/mL). A negative result must be combined with clinical observations, patient history, and epidemiological information. The expected result is Negative.  Fact Sheet for Patients:  EntrepreneurPulse.com.au  Fact Sheet for Healthcare Providers:  IncredibleEmployment.be  This test is no t yet approved or cleared by the [place] FDA and  has been authorized for detection and/or diagnosis of SARS-CoV-2 by FDA under an Emergency Use Authorization (EUA). This EUA will remain  in effect (meaning this test can be used) for the duration of the COVID-19 declaration under Section 564(b)(1) of the Act, 21 U.S.C.section 360bbb-3(b)(1), unless the authorization is terminated  or revoked sooner.       Influenza A by PCR NEGATIVE NEGATIVE Final   Influenza B by PCR NEGATIVE NEGATIVE Final    Comment: (NOTE) The Xpert Xpress SARS-CoV-2/FLU/RSV plus assay is intended as an aid in the  diagnosis of influenza from Nasopharyngeal swab specimens and should not be used as a sole basis for treatment. Nasal washings and aspirates are unacceptable for Xpert Xpress SARS-CoV-2/FLU/RSV testing.  Fact Sheet for Patients: EntrepreneurPulse.com.au  Fact Sheet for Healthcare Providers: IncredibleEmployment.be  This test is not yet approved or cleared by the [place] FDA and has been authorized for detection and/or diagnosis of SARS-CoV-2 by FDA under an Emergency Use Authorization (EUA). This EUA will remain in effect (meaning this test can be used) for the duration of the COVID-19 declaration under Section 564(b)(1) of the Act, 21 U.S.C. section 360bbb-3(b)(1), unless the authorization is terminated or revoked.  Performed at [facility], [place] [address], [place], [place] [address]   Culture, blood (routine x 2)     Status: None   Collection Time: 04/10/21 10:24 PM   Specimen: BLOOD  Result Value Ref Range Status   Specimen Description BLOOD SITE NOT SPECIFIED  Final   Special Requests   Final    BOTTLES DRAWN AEROBIC AND ANAEROBIC Blood Culture adequate volume   Culture   Final    NO GROWTH 5 DAYS Performed at [facility], [address], [place], [place] [address]    Report Status 04/16/2021 FINAL  Final  Culture, blood (routine x 2)     Status: None   Collection Time: 04/10/21 10:24 PM   Specimen: BLOOD  Result Value Ref Range Status   Specimen Description BLOOD  SITE NOT SPECIFIED  Final   Special Requests   Final    BOTTLES DRAWN AEROBIC AND ANAEROBIC Blood Culture adequate volume   Culture   Final    NO GROWTH 5 DAYS Performed at [facility], [address], [place], [place] [address]    Report Status 04/16/2021 FINAL  Final  MRSA PCR Screening     Status: None   Collection Time: 04/11/21 10:16 AM   Specimen: Nasal Mucosa; Nasopharyngeal  Result Value Ref Range Status   MRSA by PCR NEGATIVE  NEGATIVE Final    Comment:        The GeneXpert MRSA Assay (FDA approved for NASAL specimens only), is one component of a comprehensive MRSA colonization surveillance program. It is not intended to diagnose MRSA infection nor to guide or monitor treatment for MRSA infections. Performed at [facility], [place] [address], [place], [place] [address]     Pressure Injury 04/10/21 Heel Left;Lateral Stage 3 -  Full thickness tissue loss. Subcutaneous fat may be visible but bone, tendon or muscle are NOT exposed. (Active)  04/10/21 2200  Location: Heel  Location Orientation: Left;Lateral  Staging: Stage 3 -  Full thickness tissue loss. Subcutaneous fat may be visible but bone, tendon or muscle are NOT exposed.  Wound Description (Comments):   Present on Admission: Yes     Pressure Injury 04/10/21 Toe (Comment  which one) Anterior;Left Stage 2 -  Partial thickness loss of dermis presenting as a shallow open injury with a red, pink wound bed without slough. (Active)  04/10/21 2200  Location: Toe (Comment  which one) (dorsal great toe)  Location Orientation: Anterior;Left  Staging: Stage 2 -  Partial thickness loss of dermis presenting as a shallow open injury with a red, pink wound bed without slough.  Wound Description (Comments):   Present on Admission: Yes          [place]   [facility] If 7PM-7AM, please contact night-coverage at [email or web address], Office  [phone]   04/17/2021, 10:42 AM  LOS: 7 days

## 2021-04-17 NOTE — Progress Notes (Signed)
Renal Navigator notes that patient has transitioned from PD to HD during this hospitalization and met with patient at HD bedside to discuss. He is hopeful that this is a temporary modality change and that he will be able to return to PD at some point in the future. He reports that he has already spoken with his dialysis clinic/[facility] [phone] (fax: [phone]) and received an in-center HD seat schedule: MWF 11:30am. Navigator called clinic and has confirmed this schedule. Navigator will send discharge information to clinic at patient's discharge.  Patient tells Navigator that he is unsure that he will be able to return home at discharge because his wife has told him that she thinks he will be "too much to care for." He doesn't think he will be, but is concerned that she feels this way. Navigator encouraged him to take things one day at a time and see how he feels after his surgery, and see what the recommendations are by the therapy team. Navigator also encouraged him to take the recommendations, as they are made to give him the best outcome. Navigator informed him that there is a IT consultant on his unit who can discuss his options, once recommendations are made, and also asked him what his natural support system is like. He states his daughters live "here" (in [place]), so "it's not convenient for them to help." He reports he has a sister who lives locally, "but she has her own things going on." Navigator told patient that if the outcome is SNF and it turns out he needs one out of the area of his home clinic, Navigator can evaluate temporarily switching him to a clinic to accommodate his needs. Patient is very pleasant, future oriented, but presents with a depressed mood today. He seems appreciative of the visit. Navigator is available as needed.   Alphonzo Cruise, [place] Renal Navigator [phone]

## 2021-04-17 NOTE — TOC Progression Note (Signed)
Transition of Care [facility]) - Progression Note    Patient Details  Name: Maxwell Harmon MRN: [id number] Date of Birth: 1956/03/16  Transition of Care [facility]) CM/SW Contact  Zenon Mayo, RN Phone Number: 04/17/2021, 2:35 PM  Clinical Narrative:    MD has agreed for pt eval.  Awaiting results.     Barriers to Discharge: Continued Medical Work up  Expected Discharge Plan and Services   In-house Referral: Clinical Social Work     Living arrangements for the past 2 months: Single Family Home                                       Social Determinants of Health (SDOH) Interventions Food Insecurity Interventions: Intervention Not Indicated Financial Strain Interventions: Intervention Not Indicated Housing Interventions: Intervention Not Indicated Transportation Interventions: Intervention Not Indicated  Readmission Risk Interventions No flowsheet data found.

## 2021-04-17 NOTE — Progress Notes (Signed)
[facility] [place] PROGRESS NOTE  ASSESSMENT / PLAN: Maxwell Harmon is a 65 y.o. 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   Needs revascularization to optimize chances at limb salvage.  Unfortunately he does not have much suitable vein for bypass.  He has several tandem lesions in the left lower extremity which need to be addressed.  Treating these lesions endovascularly may not be as durable, but I think you will be superior to a prosthetic bypass to the ankle.  If we are unable to treat these endovascularly, we will have to do a prosthetic tibial bypass.  We will plan for angiogram of left lower extremity with intervention on Wednesday, 04/19/2021.  Patient does not need to remain in house for this.  We can do this as an outpatient.  SUBJECTIVE: No interval change.  OBJECTIVE: BP 140/78   Pulse 87   Temp (!) 97.4 F (36.3 C) (Oral)   Resp (!) 22   Ht 5\' 11"  (1.803 m)   Wt 90.1 kg   SpO2 97%   BMI 27.70 kg/m   Intake/Output Summary (Last 24 hours) at 04/17/2021 1108 Last data filed at 04/17/2021 0858 Gross per 24 hour  Intake 745 ml  Output 0 ml  Net 745 ml    No interval change to left foot.  CBC Latest Ref Rng & Units 04/17/2021 04/15/2021 04/14/2021  WBC 4.0 - 10.5 K/uL 8.2 7.0 6.3  Hemoglobin 13.0 - 17.0 g/dL 9.1(L) 8.8(L) 9.3(L)  Hematocrit 39.0 - 52.0 % 29.6(L) 28.0(L) 29.9(L)  Platelets 150 - 400 K/uL 144(L) 127(L) 114(L)     CMP Latest Ref Rng & Units 04/17/2021 04/16/2021 04/15/2021  Glucose 70 - 99 mg/dL 144(H) 100(H) 95  BUN 8 - 23 mg/dL 40(H) 26(H) 31(H)  Creatinine 0.61 - 1.24 mg/dL 5.87(H) 4.31(H) 5.16(H)  Sodium 135 - 145 mmol/L 133(L) 135 135  Potassium 3.5 - 5.1 mmol/L 4.2 3.8 4.0  Chloride 98 - 111 mmol/L 96(L) 99 97(L)  CO2 22 - 32 mmol/L 24 27 25   Calcium 8.9 - 10.3 mg/dL 8.0(L) 8.2(L) 8.1(L)  Total Protein 6.5 - 8.1 g/dL - - -  Total Bilirubin 0.3 - 1.2 mg/dL - - -  Alkaline Phos 38 - 126 U/L -  - -  AST 15 - 41 U/L - - -  ALT 0 - 44 U/L - - -    Estimated Creatinine Clearance: 13.5 mL/min (A) (by C-G formula based on SCr of 5.87 mg/dL (H)).  Yevonne Aline. Stanford Breed, MD [facility] Phone Number: [phone] 04/17/2021 11:08 AM

## 2021-04-18 LAB — GLUCOSE, CAPILLARY
Glucose-Capillary: 121 mg/dL — ABNORMAL HIGH (ref 70–99)
Glucose-Capillary: 112 mg/dL — ABNORMAL HIGH (ref 70–99)
Glucose-Capillary: 155 mg/dL — ABNORMAL HIGH (ref 70–99)
Glucose-Capillary: 206 mg/dL — ABNORMAL HIGH (ref 70–99)

## 2021-04-18 MED ORDER — VANCOMYCIN HCL 750 MG/150ML IV SOLN: 750.0000 mg | Freq: Once | INTRAVENOUS | Status: DC

## 2021-04-18 NOTE — Progress Notes (Signed)
PT Cancellation Note  Patient Details Name: Maxwell Harmon MRN: [id number] DOB: Jan 16, 1956   Cancelled Treatment:    Reason Eval/Treat Not Completed: Patient at procedure or test/unavailable Pt off floor at HD. Will follow.  [name] [name] 04/18/2021, 7:27 AM Marisa Severin, PT, DPT Acute Rehabilitation Services Pager [phone] Office [phone]

## 2021-04-18 NOTE — Progress Notes (Signed)
  Progress Note    04/18/2021 7:15 AM 4 Days Post-Op  Left lower extremity critical limb ischemia with ulceration Scheduled for Aortogram,Arteriogram of LLE tomorrow 04/19/21 NPO after midnight Consent   Karoline Caldwell, PA-C [facility] [phone] 04/18/2021 7:15 AM

## 2021-04-18 NOTE — Progress Notes (Signed)
[facility] Progress Note   Assessment/ Plan:   Dialysis Orders: Center: [facility], [place], [place]  on CCPD with 3 exchanges, 2700 ml fill volume, fill time 30 minutes, drain time 30 minutes, dwell time 2 hours.   NEPHROLOGIST: Claris Gladden  LOCATION: [place]  SCHEDULE: M-W-F 2nd Shift  EDW: TBD kg.  KIDNEY:  LITERS PROC: liters/treatment  HD TIME: 4 hrs. 0 min.  ACCESS: L Upper Arm AVF  NEEDLE SIZE: 15 ga.  ANTICOAG: Standard Heparin Per Protocol  BATH: 2.0 K-2.5Ca 35 HCO3  QB: 500 ml/min  QD: Auto Flow Protocol     Assessment/Plan: NSTEMI - Cardiology consulted and has EKG changes.  ECHO with significant decrease in EF from April 2022.  LHC 04/14/21 revealed ost RCA to prox rca 100%, prox Cx 95%, prox LAD to mid LAD is 100%, dist LAD 100% stenosed, LIMA to LAD widely patent, but LAD occluded immediately after LIMA, elevated filling pressures LVEDP 36.    Acute systolic CHF - EF 16-01% (down from 55% in April).  HF consulted   ESRD -  Normally does CCPD at home but had issues with machine and draining so had sporadic treatments and was set up for IHD day he presented to [facility] ED.  He tolerated HD well last night with UF of 2 liters.   Transitioned to IHD due to inadequate drainage/UF with CCPD (likely catheter malfunction). Will keep MWF schedule for now and will need outpatient arrangements in [place] prior to discharge.  Will need to have PD catheter evaluated as an outpatient.     Given miralax daily to help with function of PD cath and cont with weekly flushes to keep it patent. Will continue with IHD for now and will need to f/u with his primary nephrologist in [place] to decide about resuming CCPD or continue with IHD Plan for HD 6/14 since he will have angio Wednesday.  Then next HD will be Friday.    Hypertension/volume  - volume overload and plan for HD with UF as above.  ECHO with drop in EF   Anemia  - due to ESRD.  Will  dose with ESA and transfuse prn.  Metabolic bone disease -  Continue with home meds  Nutrition -  Renal diet, carb modified Vascular access:  LUE AVF +T/B Diabetic heel/foot ulcer - Vascular surgery consulted as well as Dr. Sharol Given.  Currently on vanc/zosyn.  For angio 6/15 Hypokalemia - will replete prn Hypomagnesemia - replete prn Dispo: may need SNF    Subjective:    S/p HD today off schedule- no issues today   Objective:   BP (!) 146/78   Pulse 87   Temp 98.8 F (37.1 C) (Oral)   Resp (!) 22   Ht 5\' 11"  (1.803 m)   Wt 89.2 kg   SpO2 98%   BMI 27.43 kg/m   Physical Exam: Gen: NAD, sitting in bed CVS: RRR Resp: clear Abd: PD cath in L quadrant Ext: minimal edema ACCESS: L AVF + T/B, PD catheter as well  Labs: BMET Recent Labs  Lab 04/12/21 0224 04/13/21 0257 04/14/21 0340 04/14/21 1836 04/15/21 0330 04/16/21 0231 04/17/21 0159  NA 132* 134* 136 135 135 135 133*  K 4.3 3.9 4.7 3.9 4.0 3.8 4.2  CL 95* 97* 98 97* 97* 99 96*  CO2 25 27 26 25 25 27 24   GLUCOSE 336* 180* 120* 114* 95 100* 144*  BUN 47* 33* 50* 24* 31* 26* 40*  CREATININE 6.22* 5.33* 6.80* 4.49*  4.56* 5.16* 4.31* 5.87*  CALCIUM 8.2* 8.3* 8.5* 8.2* 8.1* 8.2* 8.0*  PHOS 6.1*  --   --  3.7  --   --   --    CBC Recent Labs  Lab 04/14/21 0340 04/14/21 1836 04/15/21 0330 04/17/21 0702  WBC 6.3 6.3 7.0 8.2  HGB 9.1* 9.3* 8.8* 9.1*  HCT 28.8* 29.9* 28.0* 29.6*  MCV 98.6 98.0 96.9 97.4  PLT 115* 114* 127* 144*      Medications:     atorvastatin  80 mg Oral Daily   collagenase   Topical Daily   escitalopram  10 mg Oral Daily   gentamicin cream  1 application Topical Daily   heparin  5,000 Units Subcutaneous Q8H   insulin aspart  0-6 Units Subcutaneous TID WC   insulin glargine  5 Units Subcutaneous QHS   mupirocin cream   Topical Daily   pantoprazole  40 mg Oral Q supper   sodium chloride flush  3 mL Intravenous Q12H   sodium chloride flush  3 mL Intravenous Q12H   vancomycin  variable dose per unstable renal function (pharmacist dosing)   Does not apply See admin instructions     Madelon Lips MD 04/18/2021, 3:44 PM

## 2021-04-18 NOTE — Progress Notes (Signed)
Physical Therapy Treatment Patient Details Name: Maxwell Harmon MRN: [id number] DOB: 28-Nov-1955 Today's Date: 04/18/2021    History of Present Illness Patient is a 65 y/o male who presents on 6/6 with SOB and weakness. Found to have sepsis due to LLE cellulitis, acute encephalopathy due to uremia-started on HD and acute CHF. s/p Abdominal aortogram 6/10 showed bilateral popliteal artery occlusion, LLE with single-vessel PT runoff which is diffusely diseased. Bypass surgery for LLE PAD scheduled for 04/19/21. PMH includes CAD s/p CABG in 2021, preserved EF, Charcot foot, ESRD on peritoneal dialysis, HTN, TIAs.    PT Comments    Patient progressing well towards PT goals. Reports improved pain in left heel after taking tylenol. Improved ambulation distance with min A and use of RW for support. Requires seated rest break due to fatigue and worsening weakness with increased distance. Noted to have left knee partial buckling throughout gait x4 but able to catch self. Performed 5xSTS in 16.45 secs from elevated surface and supporting LEs on rail of bed (norm for his age is 11.4 sec) indicating decreased functional strength, impaired balance and fall risk. Pt may need [name] post surgery tomorrow pending progress. Will follow.    Follow Up Recommendations  Home health PT;Supervision for mobility/OOB (vs [name] pending progress post surgery)     Equipment Recommendations  Rolling walker with 5" wheels;3in1 (PT)    Recommendations for Other Services       Precautions / Restrictions Precautions Precautions: Fall;Other (comment) Precaution Comments: charcot foot, ulcer left heel Restrictions Weight Bearing Restrictions: No    Mobility  Bed Mobility Overal bed mobility: Needs Assistance Bed Mobility: Supine to Sit;Sit to Supine     Supine to sit: Supervision;HOB elevated Sit to supine: Supervision;HOB elevated   General bed mobility comments: No assist needed, increased time.     Transfers Overall transfer level: Needs assistance Equipment used: Rolling walker (2 wheeled) Transfers: Sit to/from Stand Sit to Stand: Min assist         General transfer comment: Min A to steady in standing and Min guard from elevated surface, stood from EOB x6, from chair x1 with cues for hand placement as pt prefers to pull up on RW.  Ambulation/Gait Ambulation/Gait assistance: Min assist   Assistive device: Rolling walker (2 wheeled) Gait Pattern/deviations: Step-through pattern;Decreased stride length;Decreased stance time - left Gait velocity: decreased   General Gait Details: Slow, mildly unsteady gait with increased knee flexion on left with partial buckling x4 ish. 1 seated rest break due to weakness/fatigue.   Stairs             Wheelchair Mobility    Modified Rankin (Stroke Patients Only)       Balance Overall balance assessment: Needs assistance Sitting-balance support: Feet supported;No upper extremity supported Sitting balance-Leahy Scale: Good     Standing balance support: During functional activity Standing balance-Leahy Scale: Poor Standing balance comment: Requires UE support in standing.                            Cognition Arousal/Alertness: Awake/alert Behavior During Therapy: Flat affect Overall Cognitive Status: Within Functional Limits for tasks assessed                                 General Comments: more talkative today; cooperative.      Exercises      General Comments General comments (skin  integrity, edema, etc.): Performed 5xSTS in 16.45 secs from elevated surface and supporting LEs on rail of bed (norm for his age is 11.4 sec) indicating decreased functional strength, impaired balance and fall risk.      Pertinent Vitals/Pain Pain Assessment: No/denies pain    Home Living                      Prior Function            PT Goals (current goals can now be found in the care  plan section) Progress towards PT goals: Progressing toward goals    Frequency    Min 3X/week      PT Plan Current plan remains appropriate    Co-evaluation              AM-PAC PT "6 Clicks" Mobility   Outcome Measure  Help needed turning from your back to your side while in a flat bed without using bedrails?: None Help needed moving from lying on your back to sitting on the side of a flat bed without using bedrails?: None Help needed moving to and from a bed to a chair (including a wheelchair)?: A Little Help needed standing up from a chair using your arms (e.g., wheelchair or bedside chair)?: A Little Help needed to walk in hospital room?: A Little Help needed climbing 3-5 steps with a railing? : A Lot 6 Click Score: 19    End of Session Equipment Utilized During Treatment: Gait belt Activity Tolerance: Patient limited by fatigue Patient left: in bed;with call bell/phone within reach;with bed alarm set Nurse Communication: Mobility status PT Visit Diagnosis: Unsteadiness on feet (R26.81);Muscle weakness (generalized) (M62.81);Difficulty in walking, not elsewhere classified (R26.2)     Time: 7471-8550 PT Time Calculation (min) (ACUTE ONLY): 22 min  Charges:  $Gait Training: 8-22 mins                     Marisa Severin, PT, DPT Acute Rehabilitation Services Pager [phone] Office [place] 04/18/2021, 2:39 PM

## 2021-04-18 NOTE — Progress Notes (Signed)
[facility]  PROGRESS NOTE  Maxwell Harmon YCX:[id number] DOB: [birth year] DOA: 04/10/2021 PCP: [name], DO   Brief HPI:   65 year old male with history of CAD s/p CABG in 2021, preserved EF, Charcot foot, ESRD on peritoneal dialysis presented with cellulitis of left foot/diabetic foot wound.  Patient was admitted to ICU started on broad-spectrum antibiotic for diabetic foot wound.  Nephrology was consulted and patient was switched to hemodialysis due to PD catheter issues. Patient significantly improved so he was transferred out of ICU  Underwent left heart catheterization yesterday, showed severe multivessel CAD.  Not amenable for further intervention.  Abdominal aortogram showed bilateral popliteal artery occlusion, left lower extremity with single-vessel PT runoff which is diffusely diseased.   Subjective   Patient seen and examined, seen during hemodialysis.  Denies any complaints.   Assessment/Plan:     Sepsis due to left lower extremity cellulitis -Sepsis physiology has resolved -Continue vancomycin and Zosyn -Blood cultures x2 are negative to date   Metabolic encephalopathy -Resolved -Likely from above   Left diabetic foot wound/PAD -Dr. Sharol Given was consulted -Vascular surgery has seen the patient; plan to take for aortogram today. -X-ray of left foot tibia and fibula was negative for osteomyelitis -Aortogram showed bilateral popliteal artery occlusions, diffusely diseased posterior tibial artery. -Vascular surgery following, plan for open revascularization on Tuesday.   ESRD -Patient has been on peritoneal dialysis at home -Switched to IHD  -Nephrology following   Acute systolic heart failure -Echocardiogram during this admission showed EF of 30 to 35% -Stress echo from April 2022 showed EF of 55% -Patient currently on HD  -Patient has history of CABG ; cardiac cath showed severe three-vessel disease, patient graft with poor runoff into heavily  diseased native circulation. -Medical management recommended, no options for revascularization   Diabetes mellitus type 2 -CBG better controlled on Lantus 5 units subcu daily -Lantus was changed from 15 units subcu daily to 10 units subcu daily due to hypoglycemia -As patient was still hypoglycemic so dose of Lantus was again changed to 5 units subcu daily.   -Continue very sensitive sliding scale insulin     Scheduled medications:    atorvastatin  80 mg Oral Daily   collagenase   Topical Daily   escitalopram  10 mg Oral Daily   gentamicin cream  1 application Topical Daily   heparin  5,000 Units Subcutaneous Q8H   insulin aspart  0-6 Units Subcutaneous TID WC   insulin glargine  5 Units Subcutaneous QHS   mupirocin cream   Topical Daily   pantoprazole  40 mg Oral Q supper   sodium chloride flush  3 mL Intravenous Q12H   sodium chloride flush  3 mL Intravenous Q12H   vancomycin variable dose per unstable renal function (pharmacist dosing)   Does not apply See admin instructions         Data Reviewed:   CBG:  Recent Labs  Lab 04/17/21 1326 04/17/21 1638 04/17/21 2106 04/18/21 0541 04/18/21 1243  GLUCAP 117* 188* 187* 121* 112*    SpO2: 98 % O2 Flow Rate (L/min): 2 L/min    Vitals:   04/18/21 1100 04/18/21 1115 04/18/21 1120 04/18/21 1130  BP: (!) 143/74 132/68 126/71 (!) 146/78  Pulse: 84 83 82 87  Resp: 20 (!) 33 13 (!) 22  Temp:    98.8 F (37.1 C)  TempSrc:    Oral  SpO2:    98%  Weight:      Height:  Intake/Output Summary (Last 24 hours) at 04/18/2021 1508 Last data filed at 04/18/2021 1148 Gross per 24 hour  Intake 581.5 ml  Output 2000 ml  Net -1418.5 ml    06/12 1901 - 06/14 0700 In: 1026.5 [P.O.:682; I.V.:94.5] Out: 2000   Filed Weights   04/17/21 1300 04/18/21 0100 04/18/21 0730  Weight: 88.1 kg 88.2 kg 89.2 kg    CBC:  Recent Labs  Lab 04/13/21 0257 04/14/21 0340 04/14/21 1836 04/15/21 0330 04/17/21 0702  WBC 4.0  6.3 6.3 7.0 8.2  HGB 8.8* 9.1* 9.3* 8.8* 9.1*  HCT 28.2* 28.8* 29.9* 28.0* 29.6*  PLT 108* 115* 114* 127* 144*  MCV 98.6 98.6 98.0 96.9 97.4  MCH 30.8 31.2 30.5 30.4 29.9  MCHC 31.2 31.6 31.1 31.4 30.7  RDW 15.3 15.1 15.2 15.2 15.7*    Complete metabolic panel:  Recent Labs  Lab 04/12/21 0224 04/13/21 0257 04/14/21 0340 04/14/21 1836 04/15/21 0330 04/16/21 0231 04/17/21 0159  NA 132*   < > 136 135 135 135 133*  K 4.3   < > 4.7 3.9 4.0 3.8 4.2  CL 95*   < > 98 97* 97* 99 96*  CO2 25   < > 26 25 25 27 24   GLUCOSE 336*   < > 120* 114* 95 100* 144*  BUN 47*   < > 50* 24* 31* 26* 40*  CREATININE 6.22*   < > 6.80* 4.49*  4.56* 5.16* 4.31* 5.87*  CALCIUM 8.2*   < > 8.5* 8.2* 8.1* 8.2* 8.0*  ALBUMIN 2.4*  --   --  2.2*  --   --   --   MG 2.1  --   --   --   --   --   --    < > = values in this interval not displayed.    No results for input(s): LIPASE, AMYLASE in the last 168 hours.   No results for input(s): CRP, DDIMER, BNP, PROCALCITON, SARSCOV2NAA in the last 168 hours.  Invalid input(s): LACTICACID   ------------------------------------------------------------------------------------------------------------------ No results for input(s): CHOL, HDL, LDLCALC, TRIG, CHOLHDL, LDLDIRECT in the last 72 hours.   Lab Results  Component Value Date   HGBA1C 8.2 (H) 04/10/2021   ------------------------------------------------------------------------------------------------------------------ No results for input(s): TSH, T4TOTAL, T3FREE, THYROIDAB in the last 72 hours.  Invalid input(s): FREET3 ------------------------------------------------------------------------------------------------------------------ No results for input(s): VITAMINB12, FOLATE, FERRITIN, TIBC, IRON, RETICCTPCT in the last 72 hours.  Coagulation profile No results for input(s): INR, PROTIME in the last 168 hours.  No results for input(s): DDIMER in the last 72 hours.   Cardiac Enzymes No  results for input(s): CKTOTAL, CKMB, CKMBINDEX, TROPONINI in the last 168 hours.  ------------------------------------------------------------------------------------------------------------------    Component Value Date/Time   BNP 4,239.3 (H) 04/10/2021 1033     Antibiotics: Anti-infectives (From admission, onward)    Start     Dose/Rate Route Frequency Ordered Stop   04/18/21 1030  vancomycin (VANCOREADY) IVPB 750 mg/150 mL        750 mg 150 mL/hr over 60 Minutes Intravenous Once in dialysis 04/18/21 1027     04/17/21 1400  vancomycin (VANCOCIN) IVPB 750 mg/150 ml premix  Status:  Discontinued        750 mg 150 mL/hr over 60 Minutes Intravenous  Once 04/17/21 0919 04/17/21 1515   04/17/21 1215  vancomycin (VANCOREADY) 750 MG/150ML IVPB       Note to Pharmacy: Wallace Cullens   : cabinet override      04/17/21 1215 04/17/21  1217   04/14/21 1515  vancomycin (VANCOREADY) IVPB 500 mg/100 mL        500 mg 100 mL/hr over 60 Minutes Intravenous  Once 04/14/21 1428 04/14/21 1852   04/12/21 2000  vancomycin (VANCOREADY) IVPB 750 mg/150 mL        750 mg 150 mL/hr over 60 Minutes Intravenous NOW 04/12/21 1852 04/12/21 2211   04/11/21 1030  vancomycin (VANCOREADY) IVPB 1500 mg/300 mL        1,500 mg 150 mL/hr over 120 Minutes Intravenous  Once 04/11/21 0937 04/11/21 1329   04/11/21 0400  piperacillin-tazobactam (ZOSYN) IVPB 2.25 g        2.25 g 100 mL/hr over 30 Minutes Intravenous Every 8 hours 04/10/21 1951     04/10/21 1951  vancomycin variable dose per unstable renal function (pharmacist dosing)         Does not apply See admin instructions 04/10/21 1951     04/10/21 1900  vancomycin (VANCOREADY) IVPB 1750 mg/350 mL        1,750 mg 175 mL/hr over 120 Minutes Intravenous  Once 04/10/21 1836 04/10/21 2322   04/10/21 1845  piperacillin-tazobactam (ZOSYN) IVPB 3.375 g        3.375 g 100 mL/hr over 30 Minutes Intravenous  Once 04/10/21 1836 04/10/21 2121        Radiology  Reports  No results found.     DVT prophylaxis: Heparin  Code Status: Full code  Family Communication: No family at bedside   Consultants: Cardiology Vascular surgery PCCM  Procedures:     Objective    Physical Examination:  General-appears in no acute distress Heart-S1-S2, regular, no murmur auscultated Lungs-clear to auscultation bilaterally, no wheezing or crackles auscultated Abdomen-soft, nontender, no organomegaly Extremities-no edema in the lower extremities Neuro-alert, oriented x3, no focal deficit noted   Status is: Inpatient  Dispo: The patient is from: Home              Anticipated d/c is to: Home              Anticipated d/c date is: 04/22/2021              Patient currently not stable for discharge  Barrier to discharge-awaiting lower extremity bypass surgery  COVID-19 Labs  No results for input(s): DDIMER, FERRITIN, LDH, CRP in the last 72 hours.   Lab Results  Component Value Date   [place] NEGATIVE 04/10/2021    Microbiology  Recent Results (from the past 240 hour(s))  Resp Panel by RT-PCR (Flu A&B, Covid) Nasopharyngeal Swab     Status: None   Collection Time: 04/10/21 11:50 AM   Specimen: Nasopharyngeal Swab; Nasopharyngeal(NP) swabs in vial transport medium  Result Value Ref Range Status   SARS Coronavirus 2 by RT PCR NEGATIVE NEGATIVE Final    Comment: (NOTE) SARS-CoV-2 target nucleic acids are NOT DETECTED.  The SARS-CoV-2 RNA is generally detectable in upper respiratory specimens during the acute phase of infection. The lowest concentration of SARS-CoV-2 viral copies this assay can detect is 138 copies/mL. A negative result does not preclude SARS-Cov-2 infection and should not be used as the sole basis for treatment or other patient management decisions. A negative result may occur with  improper specimen collection/handling, submission of specimen other than nasopharyngeal swab, presence of viral mutation(s) within  the areas targeted by this assay, and inadequate number of viral copies(<138 copies/mL). A negative result must be combined with clinical observations, patient history, and epidemiological information. The expected result  is Negative.  Fact Sheet for Patients:  EntrepreneurPulse.com.au  Fact Sheet for Healthcare Providers:  IncredibleEmployment.be  This test is no t yet approved or cleared by the [place] FDA and  has been authorized for detection and/or diagnosis of SARS-CoV-2 by FDA under an Emergency Use Authorization (EUA). This EUA will remain  in effect (meaning this test can be used) for the duration of the COVID-19 declaration under Section 564(b)(1) of the Act, 21 U.S.C.section 360bbb-3(b)(1), unless the authorization is terminated  or revoked sooner.       Influenza A by PCR NEGATIVE NEGATIVE Final   Influenza B by PCR NEGATIVE NEGATIVE Final    Comment: (NOTE) The Xpert Xpress SARS-CoV-2/FLU/RSV plus assay is intended as an aid in the diagnosis of influenza from Nasopharyngeal swab specimens and should not be used as a sole basis for treatment. Nasal washings and aspirates are unacceptable for Xpert Xpress SARS-CoV-2/FLU/RSV testing.  Fact Sheet for Patients: EntrepreneurPulse.com.au  Fact Sheet for Healthcare Providers: IncredibleEmployment.be  This test is not yet approved or cleared by the [place] FDA and has been authorized for detection and/or diagnosis of SARS-CoV-2 by FDA under an Emergency Use Authorization (EUA). This EUA will remain in effect (meaning this test can be used) for the duration of the COVID-19 declaration under Section 564(b)(1) of the Act, 21 U.S.C. section 360bbb-3(b)(1), unless the authorization is terminated or revoked.  Performed at [facility], [place] [address] [place], [place] [address]   Culture, blood (routine x 2)     Status: None    Collection Time: 04/10/21 10:24 PM   Specimen: BLOOD  Result Value Ref Range Status   Specimen Description BLOOD SITE NOT SPECIFIED  Final   Special Requests   Final    BOTTLES DRAWN AEROBIC AND ANAEROBIC Blood Culture adequate volume   Culture   Final    NO GROWTH 5 DAYS Performed at [facility], [address], [place], [place] [address]    Report Status 04/16/2021 FINAL  Final  Culture, blood (routine x 2)     Status: None   Collection Time: 04/10/21 10:24 PM   Specimen: BLOOD  Result Value Ref Range Status   Specimen Description BLOOD SITE NOT SPECIFIED  Final   Special Requests   Final    BOTTLES DRAWN AEROBIC AND ANAEROBIC Blood Culture adequate volume   Culture   Final    NO GROWTH 5 DAYS Performed at [facility], [address], [place], [place] [address]    Report Status 04/16/2021 FINAL  Final  MRSA PCR Screening     Status: None   Collection Time: 04/11/21 10:16 AM   Specimen: Nasal Mucosa; Nasopharyngeal  Result Value Ref Range Status   MRSA by PCR NEGATIVE NEGATIVE Final    Comment:        The GeneXpert MRSA Assay (FDA approved for NASAL specimens only), is one component of a comprehensive MRSA colonization surveillance program. It is not intended to diagnose MRSA infection nor to guide or monitor treatment for MRSA infections. Performed at [facility], [place] [address] [place], [place] [address]     Pressure Injury 04/10/21 Heel Left;Lateral Stage 3 -  Full thickness tissue loss. Subcutaneous fat may be visible but bone, tendon or muscle are NOT exposed. (Active)  04/10/21 2200  Location: Heel  Location Orientation: Left;Lateral  Staging: Stage 3 -  Full thickness tissue loss. Subcutaneous fat may be visible but bone, tendon or muscle  are NOT exposed.  Wound Description (Comments):   Present on Admission: Yes     Pressure Injury 04/10/21 Toe (Comment  which one) Anterior;Left Stage 2 -  Partial thickness loss of dermis presenting as  a shallow open injury with a red, pink wound bed without slough. (Active)  04/10/21 2200  Location: Toe (Comment  which one) (dorsal great toe)  Location Orientation: Anterior;Left  Staging: Stage 2 -  Partial thickness loss of dermis presenting as a shallow open injury with a red, pink wound bed without slough.  Wound Description (Comments):   Present on Admission: Yes          [place]   [facility] If 7PM-7AM, please contact night-coverage at [email or web address], Office  [phone]   04/18/2021, 3:09 PM  LOS: 8 days

## 2021-04-19 ENCOUNTER — Inpatient Hospital Stay (HOSPITAL_COMMUNITY)
Admission: EM | Disposition: A | Discharge status: Home Health | Payer: Self-pay | Source: Home / Self Care | Attending: Family Medicine

## 2021-04-19 ENCOUNTER — Other Ambulatory Visit: Payer: Self-pay | Admitting: *Deleted

## 2021-04-19 DIAGNOSIS — I739 Peripheral vascular disease, unspecified: Secondary | ICD-10-CM

## 2021-04-19 DIAGNOSIS — I70245 Atherosclerosis of native arteries of left leg with ulceration of other part of foot: Secondary | ICD-10-CM

## 2021-04-19 DIAGNOSIS — L97529 Non-pressure chronic ulcer of other part of left foot with unspecified severity: Secondary | ICD-10-CM

## 2021-04-19 HISTORY — PX: PERIPHERAL VASCULAR INTERVENTION: CATH118257

## 2021-04-19 LAB — CBC
HCT: 30.6 % — ABNORMAL LOW (ref 39.0–52.0)
Hemoglobin: 9.7 g/dL — ABNORMAL LOW (ref 13.0–17.0)
MCH: 31 pg (ref 26.0–34.0)
MCHC: 31.7 g/dL (ref 30.0–36.0)
MCV: 97.8 fL (ref 80.0–100.0)
Platelets: 128 10*3/uL — ABNORMAL LOW (ref 150–400)
RBC: 3.13 MIL/uL — ABNORMAL LOW (ref 4.22–5.81)
RDW: 16.1 % — ABNORMAL HIGH (ref 11.5–15.5)
WBC: 8.9 10*3/uL (ref 4.0–10.5)
nRBC: 0 % (ref 0.0–0.2)

## 2021-04-19 LAB — BASIC METABOLIC PANEL
Anion gap: 9 (ref 5–15)
BUN: 21 mg/dL (ref 8–23)
CO2: 28 mmol/L (ref 22–32)
Calcium: 8.4 mg/dL — ABNORMAL LOW (ref 8.9–10.3)
Chloride: 101 mmol/L (ref 98–111)
Creatinine, Ser: 4.5 mg/dL — ABNORMAL HIGH (ref 0.61–1.24)
GFR, Estimated: 14 mL/min — ABNORMAL LOW (ref >60)
Glucose, Bld: 119 mg/dL — ABNORMAL HIGH (ref 70–99)
Potassium: 4 mmol/L (ref 3.5–5.1)
Sodium: 138 mmol/L (ref 135–145)

## 2021-04-19 LAB — GLUCOSE, CAPILLARY
Glucose-Capillary: 136 mg/dL — ABNORMAL HIGH (ref 70–99)
Glucose-Capillary: 118 mg/dL — ABNORMAL HIGH (ref 70–99)
Glucose-Capillary: 113 mg/dL — ABNORMAL HIGH (ref 70–99)
Glucose-Capillary: 137 mg/dL — ABNORMAL HIGH (ref 70–99)
Glucose-Capillary: 203 mg/dL — ABNORMAL HIGH (ref 70–99)

## 2021-04-19 LAB — POCT ACTIVATED CLOTTING TIME
Activated Clotting Time: 277 seconds
Activated Clotting Time: 248 seconds

## 2021-04-19 SURGERY — PERIPHERAL VASCULAR INTERVENTION
Anesthesia: LOCAL | Laterality: Left

## 2021-04-19 MED ORDER — HEPARIN SODIUM (PORCINE) 1000 UNIT/ML IJ SOLN
INTRAMUSCULAR | Status: DC | PRN
  Administered 2021-04-19: 9000 [IU] via INTRAVENOUS

## 2021-04-19 MED ORDER — HEPARIN (PORCINE) IN NACL 1000-0.9 UT/500ML-% IV SOLN
INTRAVENOUS | Status: AC
  Filled 2021-04-19: qty 1000

## 2021-04-19 MED ORDER — FENTANYL CITRATE (PF) 100 MCG/2ML IJ SOLN
INTRAMUSCULAR | Status: DC | PRN
  Administered 2021-04-19: 50 ug via INTRAVENOUS
  Administered 2021-04-19: 25 ug via INTRAVENOUS

## 2021-04-19 MED ORDER — VANCOMYCIN HCL 750 MG/150ML IV SOLN
750.0000 mg | Freq: Once | INTRAVENOUS | Status: AC
  Administered 2021-04-19: 750 mg via INTRAVENOUS
  Filled 2021-04-19: qty 150

## 2021-04-19 MED ORDER — MIDAZOLAM HCL 2 MG/2ML IJ SOLN
INTRAMUSCULAR | Status: AC
  Filled 2021-04-19: qty 2

## 2021-04-19 MED ORDER — FENTANYL CITRATE (PF) 100 MCG/2ML IJ SOLN
INTRAMUSCULAR | Status: AC
  Filled 2021-04-19: qty 2

## 2021-04-19 MED ORDER — MIDAZOLAM HCL 2 MG/2ML IJ SOLN
INTRAMUSCULAR | Status: DC | PRN
  Administered 2021-04-19: 2 mg via INTRAVENOUS

## 2021-04-19 MED ORDER — IODIXANOL 320 MG/ML IV SOLN
INTRAVENOUS | Status: DC | PRN
  Administered 2021-04-19: 80 mL via INTRA_ARTERIAL

## 2021-04-19 MED ORDER — LIDOCAINE-EPINEPHRINE 1 %-1:100000 IJ SOLN
INTRAMUSCULAR | Status: DC | PRN
  Administered 2021-04-19: 1 mL

## 2021-04-19 MED ORDER — HEPARIN (PORCINE) IN NACL 1000-0.9 UT/500ML-% IV SOLN
INTRAVENOUS | Status: DC | PRN
Start: 2021-04-19 — End: 2021-04-19
  Administered 2021-04-19 (×2): 500 mL

## 2021-04-19 MED ORDER — LIDOCAINE HCL (PF) 1 % IJ SOLN
INTRAMUSCULAR | Status: AC
  Filled 2021-04-19: qty 30

## 2021-04-19 SURGICAL SUPPLY — 35 items
BALLN COYOTE OTW 3X80X150 (BALLOONS) ×3
BALLN STERLING OTW 5X100X135 (BALLOONS) ×3
BALLOON COYOTE OTW 3X80X150 (BALLOONS) ×1 IMPLANT
BALLOON STERLING OTW 5X100X135 (BALLOONS) ×2 IMPLANT
CATH CROSS OVER TEMPO 5F (CATHETERS) ×3 IMPLANT
CATH NAVICROSS ANGLED 90CM (MICROCATHETER) ×3 IMPLANT
CATH NAVICROSS ST 65CM (CATHETERS) ×1 IMPLANT
CATH OMNI FLUSH 5F 65CM (CATHETERS) ×2 IMPLANT
CATH QUICKCROSS .035X135CM (MICROCATHETER) ×2 IMPLANT
CATH SHOCKWAVE 5.0X60 (CATHETERS) ×3 IMPLANT
CATH SOFT-VU ST 4F 90CM (CATHETERS) ×2 IMPLANT
CATHETER NAVICROSS ST 65CM (CATHETERS) ×3
CLOSURE PERCLOSE PROSTYLE (VASCULAR PRODUCTS) ×2 IMPLANT
COVER DOME SNAP 22 D (MISCELLANEOUS) ×2 IMPLANT
DEVICE TORQUE H2O (MISCELLANEOUS) ×2 IMPLANT
GLIDEWIRE ADV .035X260CM (WIRE) ×3 IMPLANT
GUIDEWIRE ANGLED .035X150CM (WIRE) ×3 IMPLANT
GUIDEWIRE ANGLED .035X260CM (WIRE) ×2 IMPLANT
GUIDEWIRE ZILIENT 30G 014 (WIRE) ×2 IMPLANT
GUIDEWIRE ZILIENT 30G 018 (WIRE) IMPLANT
GUIDEWIRE ZILIENT 6G 014 (WIRE) ×8 IMPLANT
KIT ENCORE 26 ADVANTAGE (KITS) ×2 IMPLANT
KIT MICROPUNCTURE NIT STIFF (SHEATH) ×3 IMPLANT
KIT PV (KITS) ×3 IMPLANT
SHEATH DESTINATION MP 6FR 90CM (SHEATH) ×2 IMPLANT
SHEATH HIGHFLEX ANSEL 6FRX55 (SHEATH) ×3 IMPLANT
SHEATH PINNACLE 5F 10CM (SHEATH) ×2 IMPLANT
SHEATH PINNACLE MP 6F 45CM (SHEATH) ×3 IMPLANT
SHEATH PROBE COVER 6X72 (BAG) ×2 IMPLANT
TRANSDUCER W/STOPCOCK (MISCELLANEOUS) ×3 IMPLANT
TRAY PV CATH (CUSTOM PROCEDURE TRAY) ×3 IMPLANT
WIRE AMPLATZ SS-J .035X260CM (WIRE) ×2 IMPLANT
WIRE BENTSON .035X145CM (WIRE) ×2 IMPLANT
WIRE HI TORQ SPORT .014X300CM (WIRE) ×2 IMPLANT
WIRE ROSEN-J .035X260CM (WIRE) ×2 IMPLANT

## 2021-04-19 NOTE — Care Management Important Message (Signed)
Important Message  Patient Details  Name: Maxwell Harmon MRN: [id number] Date of Birth: 1956-05-23   Medicare Important Message Given:  Yes   Patient left prior to IM delivery will mail to the patient home address.   Pastor Sgro 04/19/2021, 1:53 PM

## 2021-04-19 NOTE — Progress Notes (Signed)
PROGRESS NOTE    KALEN RATAJCZAK  QIW:[id number] DOB: December 26, 1955 DOA: 04/10/2021 PCP: [name], DO     Brief Narrative:  Maxwell Harmon is a 65 year old male with history of CAD s/p CABG in 2021, preserved EF, Charcot foot, ESRD on peritoneal dialysis presented with cellulitis of left foot/diabetic foot wound.  Patient was admitted to ICU started on broad-spectrum antibiotic for diabetic foot wound.  Nephrology was consulted and patient was switched to hemodialysis due to PD catheter issues. Patient significantly improved so he was transferred out of ICU. Patient underwent left heart catheterization, showed severe multivessel CAD.  Not amenable for further intervention.  Abdominal aortogram showed bilateral popliteal artery occlusion, left lower extremity with single-vessel PT runoff which is diffusely diseased.  New events last 24 hours / Subjective: Patient underwent left lower extremity angiogram s/p lithotripsy and angioplasty. Feeling well post-op. No complaints on exam.   Assessment & Plan:   Active Problems:   Acute heart failure (HCC)   Uremia   Pressure injury of skin   Sepsis secondary to left lower extremity cellulitis, diabetic wound, severe PAD, critical limb ischemia -Sepsis physiology has resolved -Dr. Sharol Given consulted -Vascular surgery following, status post angiogram 6/15 with revascularization -Continue vancomycin, Zosyn  ESRD -Now on intermittent hemodialysis -Nephrology following  NSTEMI -Cardiology consulted, status post heart cath 6/10, severe three-vessel CAD with patent grafts but poor runoff into heavily diseased native circulation.  Continue medical therapy, no other options for revascularization -Continue lipitor   Acute systolic heart failure -Volume management with dialysis  Diabetes mellitus type 2 -Continue Lantus, sliding scale insulin  Acute metabolic encephalopathy -Resolved  Anxiety -Continue lexapro, klonopin      In agreement  with assessment of the pressure ulcer as below:  Pressure Injury 04/10/21 Heel Left;Lateral Stage 3 -  Full thickness tissue loss. Subcutaneous fat may be visible but bone, tendon or muscle are NOT exposed. (Active)  04/10/21 2200  Location: Heel  Location Orientation: Left;Lateral  Staging: Stage 3 -  Full thickness tissue loss. Subcutaneous fat may be visible but bone, tendon or muscle are NOT exposed.  Wound Description (Comments):   Present on Admission: Yes     Pressure Injury 04/10/21 Toe (Comment  which one) Anterior;Left Stage 2 -  Partial thickness loss of dermis presenting as a shallow open injury with a red, pink wound bed without slough. (Active)  04/10/21 2200  Location: Toe (Comment  which one)  Location Orientation: Anterior;Left  Staging: Stage 2 -  Partial thickness loss of dermis presenting as a shallow open injury with a red, pink wound bed without slough.  Wound Description (Comments):   Present on Admission: Yes         DVT prophylaxis:  heparin injection 5,000 Units Start: 04/14/21 2200 SCDs Start: 04/10/21 1640  Code Status:     Code Status Orders  (From admission, onward)           Start     Ordered   04/10/21 1758  Full code  Continuous        04/10/21 1802           Code Status History     Date Active Date Inactive Code Status Order ID Comments User Context   04/10/2021 1640 04/10/2021 1802 Full Code [id number]  Jose Persia, MD ED   01/08/2019 1501 01/11/2019 1655 Full Code [id number]  Raynelle Bring, MD Inpatient      Family Communication: None at bedside Disposition Plan:  Status is: Inpatient  Remains inpatient appropriate because:IV treatments appropriate due to intensity of illness or inability to take PO  Dispo: The patient is from: Home              Anticipated d/c is to: Home              Patient currently is not medically stable to d/c.   Difficult to place patient No     Antimicrobials:  Anti-infectives (From admission,  onward)    Start     Dose/Rate Route Frequency Ordered Stop   04/19/21 1800  vancomycin (VANCOREADY) IVPB 750 mg/150 mL        750 mg 150 mL/hr over 60 Minutes Intravenous  Once 04/19/21 1516     04/18/21 1030  vancomycin (VANCOREADY) IVPB 750 mg/150 mL  Status:  Discontinued        750 mg 150 mL/hr over 60 Minutes Intravenous Once in dialysis 04/18/21 1027 04/19/21 1516   04/17/21 1400  vancomycin (VANCOCIN) IVPB 750 mg/150 ml premix  Status:  Discontinued        750 mg 150 mL/hr over 60 Minutes Intravenous  Once 04/17/21 0919 04/17/21 1515   04/17/21 1215  vancomycin (VANCOREADY) 750 MG/150ML IVPB       Note to Pharmacy: Wallace Cullens   : cabinet override      04/17/21 1215 04/17/21 1217   04/14/21 1515  vancomycin (VANCOREADY) IVPB 500 mg/100 mL        500 mg 100 mL/hr over 60 Minutes Intravenous  Once 04/14/21 1428 04/14/21 1852   04/12/21 2000  vancomycin (VANCOREADY) IVPB 750 mg/150 mL        750 mg 150 mL/hr over 60 Minutes Intravenous NOW 04/12/21 1852 04/12/21 2211   04/11/21 1030  vancomycin (VANCOREADY) IVPB 1500 mg/300 mL        1,500 mg 150 mL/hr over 120 Minutes Intravenous  Once 04/11/21 0937 04/11/21 1329   04/11/21 0400  piperacillin-tazobactam (ZOSYN) IVPB 2.25 g        2.25 g 100 mL/hr over 30 Minutes Intravenous Every 8 hours 04/10/21 1951     04/10/21 1951  vancomycin variable dose per unstable renal function (pharmacist dosing)         Does not apply See admin instructions 04/10/21 1951     04/10/21 1900  vancomycin (VANCOREADY) IVPB 1750 mg/350 mL        1,750 mg 175 mL/hr over 120 Minutes Intravenous  Once 04/10/21 1836 04/10/21 2322   04/10/21 1845  piperacillin-tazobactam (ZOSYN) IVPB 3.375 g        3.375 g 100 mL/hr over 30 Minutes Intravenous  Once 04/10/21 1836 04/10/21 2121        Objective: Vitals:   04/19/21 1154 04/19/21 1159 04/19/21 1204 04/19/21 1322  BP: (!) 146/85 (!) 147/82 (!) 146/85 111/79  Pulse: 84 84 75 82  Resp: (!) 26 (!)  25 (!) 52 15  Temp:    (!) 97.5 F (36.4 C)  TempSrc:    Axillary  SpO2: 100% 98% 99% 99%  Weight:      Height:        Intake/Output Summary (Last 24 hours) at 04/19/2021 1601 Last data filed at 04/19/2021 0432 Gross per 24 hour  Intake 169.18 ml  Output 0 ml  Net 169.18 ml   Filed Weights   04/18/21 0100 04/18/21 0730 04/19/21 0334  Weight: 88.2 kg 89.2 kg 85.7 kg    Examination:  General exam: Appears calm and comfortable  Respiratory  system: Clear to auscultation. Respiratory effort normal. No respiratory distress. No conversational dyspnea.  Cardiovascular system: S1 & S2 heard, RRR. No murmurs. No pedal edema. Gastrointestinal system: Abdomen is nondistended, soft and nontender. Normal bowel sounds heard. Central nervous system: Alert and oriented. No focal neurological deficits. Speech clear.  Psychiatry: Judgement and insight appear normal. Mood & affect appropriate.   Data Reviewed: I have personally reviewed following labs and imaging studies  CBC: Recent Labs  Lab 04/14/21 0340 04/14/21 1836 04/15/21 0330 04/17/21 0702 04/19/21 1333  WBC 6.3 6.3 7.0 8.2 8.9  HGB 9.1* 9.3* 8.8* 9.1* 9.7*  HCT 28.8* 29.9* 28.0* 29.6* 30.6*  MCV 98.6 98.0 96.9 97.4 97.8  PLT 115* 114* 127* 144* 992*   Basic Metabolic Panel: Recent Labs  Lab 04/14/21 1836 04/15/21 0330 04/16/21 0231 04/17/21 0159 04/19/21 1333  NA 135 135 135 133* 138  K 3.9 4.0 3.8 4.2 4.0  CL 97* 97* 99 96* 101  CO2 25 25 27 24 28   GLUCOSE 114* 95 100* 144* 119*  BUN 24* 31* 26* 40* 21  CREATININE 4.49*  4.56* 5.16* 4.31* 5.87* 4.50*  CALCIUM 8.2* 8.1* 8.2* 8.0* 8.4*  PHOS 3.7  --   --   --   --    GFR: Estimated Creatinine Clearance: 17.7 mL/min (A) (by C-G formula based on SCr of 4.5 mg/dL (H)). Liver Function Tests: Recent Labs  Lab 04/14/21 1836  ALBUMIN 2.2*   No results for input(s): LIPASE, AMYLASE in the last 168 hours. No results for input(s): AMMONIA in the last 168  hours. Coagulation Profile: No results for input(s): INR, PROTIME in the last 168 hours. Cardiac Enzymes: No results for input(s): CKTOTAL, CKMB, CKMBINDEX, TROPONINI in the last 168 hours. BNP (last 3 results) No results for input(s): PROBNP in the last 8760 hours. HbA1C: No results for input(s): HGBA1C in the last 72 hours. CBG: Recent Labs  Lab 04/18/21 1606 04/18/21 2109 04/19/21 0716 04/19/21 1220 04/19/21 1335  GLUCAP 155* 206* 136* 118* 113*   Lipid Profile: No results for input(s): CHOL, HDL, LDLCALC, TRIG, CHOLHDL, LDLDIRECT in the last 72 hours. Thyroid Function Tests: No results for input(s): TSH, T4TOTAL, FREET4, T3FREE, THYROIDAB in the last 72 hours. Anemia Panel: No results for input(s): VITAMINB12, FOLATE, FERRITIN, TIBC, IRON, RETICCTPCT in the last 72 hours. Sepsis Labs: No results for input(s): PROCALCITON, LATICACIDVEN in the last 168 hours.  Recent Results (from the past 240 hour(s))  Resp Panel by RT-PCR (Flu A&B, Covid) Nasopharyngeal Swab     Status: None   Collection Time: 04/10/21 11:50 AM   Specimen: Nasopharyngeal Swab; Nasopharyngeal(NP) swabs in vial transport medium  Result Value Ref Range Status   SARS Coronavirus 2 by RT PCR NEGATIVE NEGATIVE Final    Comment: (NOTE) SARS-CoV-2 target nucleic acids are NOT DETECTED.  The SARS-CoV-2 RNA is generally detectable in upper respiratory specimens during the acute phase of infection. The lowest concentration of SARS-CoV-2 viral copies this assay can detect is 138 copies/mL. A negative result does not preclude SARS-Cov-2 infection and should not be used as the sole basis for treatment or other patient management decisions. A negative result may occur with  improper specimen collection/handling, submission of specimen other than nasopharyngeal swab, presence of viral mutation(s) within the areas targeted by this assay, and inadequate number of viral copies(<138 copies/mL). A negative result must be  combined with clinical observations, patient history, and epidemiological information. The expected result is Negative.  Fact Sheet for Patients:  EntrepreneurPulse.com.au  Fact Sheet for Healthcare Providers:  IncredibleEmployment.be  This test is no t yet approved or cleared by the [place] FDA and  has been authorized for detection and/or diagnosis of SARS-CoV-2 by FDA under an Emergency Use Authorization (EUA). This EUA will remain  in effect (meaning this test can be used) for the duration of the COVID-19 declaration under Section 564(b)(1) of the Act, 21 U.S.C.section 360bbb-3(b)(1), unless the authorization is terminated  or revoked sooner.       Influenza A by PCR NEGATIVE NEGATIVE Final   Influenza B by PCR NEGATIVE NEGATIVE Final    Comment: (NOTE) The Xpert Xpress SARS-CoV-2/FLU/RSV plus assay is intended as an aid in the diagnosis of influenza from Nasopharyngeal swab specimens and should not be used as a sole basis for treatment. Nasal washings and aspirates are unacceptable for Xpert Xpress SARS-CoV-2/FLU/RSV testing.  Fact Sheet for Patients: EntrepreneurPulse.com.au  Fact Sheet for Healthcare Providers: IncredibleEmployment.be  This test is not yet approved or cleared by the [place] FDA and has been authorized for detection and/or diagnosis of SARS-CoV-2 by FDA under an Emergency Use Authorization (EUA). This EUA will remain in effect (meaning this test can be used) for the duration of the COVID-19 declaration under Section 564(b)(1) of the Act, 21 U.S.C. section 360bbb-3(b)(1), unless the authorization is terminated or revoked.  Performed at [facility], [place] [address] [place], [place] [address]   Culture, blood (routine x 2)     Status: None   Collection Time: 04/10/21 10:24 PM   Specimen: BLOOD  Result Value Ref Range Status   Specimen Description BLOOD SITE  NOT SPECIFIED  Final   Special Requests   Final    BOTTLES DRAWN AEROBIC AND ANAEROBIC Blood Culture adequate volume   Culture   Final    NO GROWTH 5 DAYS Performed at [facility], [address] [place], [place] [address]    Report Status 04/16/2021 FINAL  Final  Culture, blood (routine x 2)     Status: None   Collection Time: 04/10/21 10:24 PM   Specimen: BLOOD  Result Value Ref Range Status   Specimen Description BLOOD SITE NOT SPECIFIED  Final   Special Requests   Final    BOTTLES DRAWN AEROBIC AND ANAEROBIC Blood Culture adequate volume   Culture   Final    NO GROWTH 5 DAYS Performed at [facility], [address] [place], [place] [address]    Report Status 04/16/2021 FINAL  Final  MRSA PCR Screening     Status: None   Collection Time: 04/11/21 10:16 AM   Specimen: Nasal Mucosa; Nasopharyngeal  Result Value Ref Range Status   MRSA by PCR NEGATIVE NEGATIVE Final    Comment:        The GeneXpert MRSA Assay (FDA approved for NASAL specimens only), is one component of a comprehensive MRSA colonization surveillance program. It is not intended to diagnose MRSA infection nor to guide or monitor treatment for MRSA infections. Performed at [facility], [place] [address] [place], [place] [address]       Radiology Studies: PERIPHERAL VASCULAR CATHETERIZATION  Result Date: 04/19/2021 Formatting of this result is different from the original. DATE OF SERVICE: 04/19/2021   PATIENT:  [name]  65 y.o. male   PRE-OPERATIVE DIAGNOSIS:  Atherosclerosis of native arteries of left lower extremity causing ulceration   POST-OPERATIVE DIAGNOSIS:  Same   PROCEDURE:  1) US guided right common  femoral artery access 2) Left lower extremity angiogram with third order cannulation (135mL total contrast) 3) Conscious sedation (167 minutes) 4) Left popliteal artery intravascular lithotripsy and angioplasty (5x75mm Shockwave) 5) Left posterior tibial artery angioplasty  (3x53mm)   SURGEON:  Surgeon(s) and Role:    * Cherre Robins, MD - Primary   ASSISTANT: none   ANESTHESIA:   local and IV sedation   EBL: min   BLOOD ADMINISTERED:none   DRAINS: none   LOCAL MEDICATIONS USED:  LIDOCAINE   SPECIMEN:  none   COUNTS: confirmed correct.   TOURNIQUET:  none   PATIENT DISPOSITION:  PACU - hemodynamically stable.  Delay start of Pharmacological VTE agent (>24hrs) due to surgical blood loss or risk of bleeding: no   INDICATION FOR PROCEDURE: BRETTON TANDY is a 65 y.o. male with ulceration of his left foot and severe popliteal and tibial disease demonstrated on angiogram last week. After careful discussion of risks, benefits, and alternatives the patient was offered intervention. We specifically discussed access site complications. The patient understood and wished to proceed.   OPERATIVE FINDINGS: Unchanged appearance of the left lower extremity vasculature from angiogram on Friday. Severe greater than 95% exophytic calcified stenosis artery behind the knee Greater than 95% stenosis in 2 areas of the proximal posterior tibial artery. 80% stenosis and long segment distal posterior tibial artery near the ankle with robust collateralization. I was not able to cross the distalmost posterior tibial artery lesion with a balloon. The remainder of the lesions totally resolved with lithotripsy and angioplasty/   DESCRIPTION OF PROCEDURE: After identification of the patient in the pre-operative holding area, the patient was transferred to the operating room. The patient was positioned supine on the operating room table. Anesthesia was induced. The groins was prepped and draped in standard fashion. A surgical pause was performed confirming correct patient, procedure, and operative location.   The right groin was anesthetized with subcutaneous injection of 1% lidocaine. Using ultrasound guidance, the right common femoral artery was accessed with micropuncture technique. Fluoroscopy was used to  confirm cannulation over the femoral head. Sheathogram was not performed. The 78F sheath was upsized to 53F.   An 035 glidewire advantage was advanced into the distal aorta. Over the wire an omni flush catheter was advanced to the level of L2. Aortogram was performed - see above for details.   The left common iliac artery was selected with the Dillard . The wire was advanced into the popliteal artery.  An 035 catheter was then advanced into the popliteal artery.  It was incredibly difficult to deliver a sheath into the proximal superficial femoral artery.  A number of wire, catheter, and sheath exchanges were tried.  Ultimately a flexible 6 [place] by 55 cm Ansell sheath was advanced into the superficial femoral artery. Selective angiography was performed - see above for details.   The decision was made to interven. The patient was heparinized with 8000 units of heparin.  Activated clotting time measurements were used throughout the case to confirm adequate anticoagulation   The popliteal lesion was crossed with an 035 Glidewire advantage and quick cross catheter.  A confirmatory angiogram confirmed that were in the true lumen.  An 014 wire was advanced in the posterior tibial artery.  The lesion was treated with several treatments of intravascular lithotripsy with shockwave 5 x 60 mm balloon system.  This completely resolved the stenosis. The proximal posterior tibial artery lesions were then angioplastied with a 3 x 80 mm  coyote balloon.  This treatment resolved the proximal stenoses. I was able to cross the distal posterior tibial artery lesion with an 014 Glidewire, but was unable to track a low-profile balloon across this lesion.  I ultimately decided to end the case here because of the lengthy course and the significantly improved flow to the foot.   A Perclose device was used to close the arteriotomy. Hemostasis was excellent upon completion.   Conscious sedation was administered with the use of IV  fentanyl and midazolam under continuous physician and nurse monitoring.  Heart rate, blood pressure, and oxygen saturation were continuously monitored.  Total sedation time was 167 minutes   Upon completion of the case instrument and sharps counts were confirmed correct. The patient was transferred to the PACU in good condition. I was present for all portions of the procedure.   PLAN: Dual antiplatelet therapy indefinitely.  High intensity statin therapy indefinitely.  Trial of local wound care.  We will reevaluate in the office in 1 month.  If if wounds are not healing, we will offer retrograde access from the ankle to treat this distalmost lesion.   Yevonne Aline. Stanford Breed, MD [facility] Phone Number: [phone] 04/19/2021 11:58 AM     Scheduled Meds:  atorvastatin  80 mg Oral Daily   collagenase   Topical Daily   escitalopram  10 mg Oral Daily   gentamicin cream  1 application Topical Daily   heparin  5,000 Units Subcutaneous Q8H   insulin aspart  0-6 Units Subcutaneous TID WC   insulin glargine  5 Units Subcutaneous QHS   mupirocin cream   Topical Daily   pantoprazole  40 mg Oral Q supper   sodium chloride flush  3 mL Intravenous Q12H   sodium chloride flush  3 mL Intravenous Q12H   vancomycin variable dose per unstable renal function (pharmacist dosing)   Does not apply See admin instructions   Continuous Infusions:  sodium chloride Stopped (04/18/21 0630)   sodium chloride 10 mL/hr at 04/19/21 0506   sodium chloride     piperacillin-tazobactam (ZOSYN)  IV 2.25 g (04/19/21 1416)   vancomycin       LOS: 9 days      Time spent: 25 minutes   Dessa Phi, DO [facility] 04/19/2021, 4:01 PM   Available via Epic secure chat 7am-7pm After these hours, please refer to coverage provider listed on [email or web address]

## 2021-04-19 NOTE — Progress Notes (Signed)
Pt arrived to 4e from cath lab. Pt oriented to room and staff. Pt denies pain. Telemetry box applied and CCMD notified x2 verifiers.

## 2021-04-19 NOTE — Op Note (Addendum)
DATE OF SERVICE: 04/19/2021  PATIENT:  Maxwell Harmon  65 y.o. male  PRE-OPERATIVE DIAGNOSIS:  Atherosclerosis of native arteries of left lower extremity causing ulceration  POST-OPERATIVE DIAGNOSIS:  Same  PROCEDURE:   1) US guided right common femoral artery access 2) Left lower extremity angiogram with third order cannulation (135mL total contrast) 3) Conscious sedation (167 minutes) 4) Left popliteal artery intravascular lithotripsy and angioplasty (5x44mm Shockwave) 5) Left posterior tibial artery angioplasty (3x39mm)  SURGEON:  Surgeon(s) and Role:    * Cherre Robins, MD - Primary  ASSISTANT: none  ANESTHESIA:   local and IV sedation  EBL: min  BLOOD ADMINISTERED:none  DRAINS: none   LOCAL MEDICATIONS USED:  LIDOCAINE   SPECIMEN:  none  COUNTS: confirmed correct.  TOURNIQUET:  none  PATIENT DISPOSITION:  PACU - hemodynamically stable.   Delay start of Pharmacological VTE agent (>24hrs) due to surgical blood loss or risk of bleeding: no  INDICATION FOR PROCEDURE: Maxwell Harmon is a 65 y.o. male with ulceration of his left foot and severe popliteal and tibial disease demonstrated on angiogram last week. After careful discussion of risks, benefits, and alternatives the patient was offered intervention. We specifically discussed access site complications. The patient understood and wished to proceed.  OPERATIVE FINDINGS:  Unchanged appearance of the left lower extremity vasculature from angiogram on Friday. Severe greater than 95% exophytic calcified stenosis artery behind the knee Greater than 95% stenosis in 2 areas of the proximal posterior tibial artery. 80% stenosis and long segment distal posterior tibial artery near the ankle with robust collateralization. I was not able to cross the distalmost posterior tibial artery lesion with a balloon. The remainder of the lesions totally resolved with lithotripsy and angioplasty/  DESCRIPTION OF PROCEDURE: After  identification of the patient in the pre-operative holding area, the patient was transferred to the operating room. The patient was positioned supine on the operating room table. Anesthesia was induced. The groins was prepped and draped in standard fashion. A surgical pause was performed confirming correct patient, procedure, and operative location.  The right groin was anesthetized with subcutaneous injection of 1% lidocaine. Using ultrasound guidance, the right common femoral artery was accessed with micropuncture technique. Fluoroscopy was used to confirm cannulation over the femoral head. Sheathogram was not performed. The 19F sheath was upsized to 17F.   An 035 glidewire advantage was advanced into the distal aorta. Over the wire an omni flush catheter was advanced to the level of L2. Aortogram was performed - see above for details.   The left common iliac artery was selected with the [place] . The wire was advanced into the popliteal artery.  An 035 catheter was then advanced into the popliteal artery.  It was incredibly difficult to deliver a sheath into the proximal superficial femoral artery.  A number of wire, catheter, and sheath exchanges were tried.  Ultimately a flexible 6 [place] by 55 cm Ansell sheath was advanced into the superficial femoral artery. Selective angiography was performed - see above for details.   The decision was made to interven. The patient was heparinized with 8000 units of heparin.  Activated clotting time measurements were used throughout the case to confirm adequate anticoagulation  The popliteal lesion was crossed with an 035 Glidewire advantage and quick cross catheter.  A confirmatory angiogram confirmed that were in the true lumen.  An 014 wire was advanced in the posterior tibial artery.  The lesion was treated with several treatments of intravascular lithotripsy  with shockwave 5 x 60 mm balloon system.  This completely resolved the stenosis.  The proximal  posterior tibial artery lesions were then angioplastied with a 3 x 80 mm coyote balloon.  This treatment resolved the proximal stenoses.  I was able to cross the distal posterior tibial artery lesion with an 014 Glidewire, but was unable to track a low-profile balloon across this lesion.  I ultimately decided to end the case here because of the lengthy course and the significantly improved flow to the foot.   A Perclose device was used to close the arteriotomy. Hemostasis was excellent upon completion.  Conscious sedation was administered with the use of IV fentanyl and midazolam under continuous physician and nurse monitoring.  Heart rate, blood pressure, and oxygen saturation were continuously monitored.  Total sedation time was 167 minutes  Upon completion of the case instrument and sharps counts were confirmed correct. The patient was transferred to the PACU in good condition. I was present for all portions of the procedure.  PLAN: Dual antiplatelet therapy indefinitely.  High intensity statin therapy indefinitely.  Trial of local wound care.  We will reevaluate in the office in 1 month.  If if wounds are not healing, we will offer retrograde access from the ankle to treat this distalmost lesion.  Maxwell Harmon. Stanford Breed, MD [facility] Phone Number: [phone] 04/19/2021 11:58 AM

## 2021-04-19 NOTE — Progress Notes (Signed)
Maxwell Harmon Progress Note   Assessment/ Plan:   Dialysis Orders: Center: [facility], [place], [place]  on CCPD with 3 exchanges, 2700 ml fill volume, fill time 30 minutes, drain time 30 minutes, dwell time 2 hours.   NEPHROLOGIST: Claris Gladden  LOCATION: [place]  SCHEDULE: M-W-F 2nd Shift  EDW: TBD kg.  KIDNEY:  LITERS PROC: liters/treatment  HD TIME: 4 hrs. 0 min.  ACCESS: L Upper Arm AVF  NEEDLE SIZE: 15 ga.  ANTICOAG: Standard Heparin Per Protocol  BATH: 2.0 K-2.5Ca 35 HCO3  QB: 500 ml/min  QD: Auto Flow Protocol     Assessment/Plan: NSTEMI - Cardiology consulted and has EKG changes.  ECHO with significant decrease in EF from April 2022.  LHC 04/14/21 revealed ost RCA to prox rca 100%, prox Cx 95%, prox LAD to mid LAD is 100%, dist LAD 100% stenosed, LIMA to LAD widely patent, but LAD occluded immediately after LIMA, elevated filling pressures LVEDP 36.    Acute systolic CHF - EF 03-88% (down from 55% in April).  HF consulted   ESRD -  Normally does CCPD at home but had issues with machine and draining so had sporadic treatments and was set up for IHD day he presented [facility] ED.  Transitioned to IHD due to inadequate drainage/UF with CCPD (likely catheter malfunction). Will keep MWF schedule for now and will need outpatient arrangements in [place] prior to discharge.  Will need to have PD catheter evaluated as an outpatient.     Given miralax daily to help with function of PD cath and cont with weekly flushes to keep it patent. Will continue with IHD for now and will need to f/u with his primary nephrologist in [place] to decide about resuming CCPD or continue with IHD S/p HD 6/14 since he had angio Wednesday.  Then next HD will be Friday.    Hypertension/volume  - volume overload and plan for HD with UF as above.  ECHO with drop in EF   Anemia  - due to ESRD.  Will dose with ESA and transfuse prn.  Metabolic bone disease -  Continue  with home meds  Nutrition -  Renal diet, carb modified Vascular access:  LUE AVF +T/B Diabetic heel/foot ulcer - Vascular surgery consulted as well as Dr. Sharol Given.  Currently on vanc/zosyn. s/p angio 6/15 with lithotripsy of lesion Hypokalemia - will replete prn Hypomagnesemia - replete prn Dispo: may need SNF    Subjective:    S/p angio with lithotripsy today with VVS. Tolerated well.     Objective:   BP 111/79 (BP Location: Right Leg)   Pulse 82   Temp (!) 97.5 F (36.4 C) (Axillary)   Resp 15   Ht 5\' 11"  (1.803 m)   Wt 85.7 kg   SpO2 99%   BMI 26.36 kg/m   Physical Exam: Gen: NAD, sitting in bed CVS: RRR Resp: clear Abd: PD cath in L quadrant Ext: minimal edema ACCESS: L AVF + T/B, PD catheter as well  Labs: BMET Recent Labs  Lab 04/13/21 0257 04/14/21 0340 04/14/21 1836 04/15/21 0330 04/16/21 0231 04/17/21 0159 04/19/21 1333  NA 134* 136 135 135 135 133* 138  K 3.9 4.7 3.9 4.0 3.8 4.2 4.0  CL 97* 98 97* 97* 99 96* 101  CO2 27 26 25 25 27 24 28   GLUCOSE 180* 120* 114* 95 100* 144* 119*  BUN 33* 50* 24* 31* 26* 40* 21  CREATININE 5.33* 6.80* 4.49*  4.56* 5.16* 4.31* 5.87* 4.50*  CALCIUM 8.3* 8.5* 8.2* 8.1* 8.2* 8.0* 8.4*  PHOS  --   --  3.7  --   --   --   --    CBC Recent Labs  Lab 04/14/21 1836 04/15/21 0330 04/17/21 0702 04/19/21 1333  WBC 6.3 7.0 8.2 8.9  HGB 9.3* 8.8* 9.1* 9.7*  HCT 29.9* 28.0* 29.6* 30.6*  MCV 98.0 96.9 97.4 97.8  PLT 114* 127* 144* 128*      Medications:     atorvastatin  80 mg Oral Daily   collagenase   Topical Daily   escitalopram  10 mg Oral Daily   gentamicin cream  1 application Topical Daily   heparin  5,000 Units Subcutaneous Q8H   insulin aspart  0-6 Units Subcutaneous TID WC   insulin glargine  5 Units Subcutaneous QHS   mupirocin cream   Topical Daily   pantoprazole  40 mg Oral Q supper   sodium chloride flush  3 mL Intravenous Q12H   sodium chloride flush  3 mL Intravenous Q12H   vancomycin variable  dose per unstable renal function (pharmacist dosing)   Does not apply See admin instructions     Madelon Lips MD 04/19/2021, 2:37 PM

## 2021-04-20 ENCOUNTER — Encounter (HOSPITAL_COMMUNITY): Payer: Self-pay | Admitting: Vascular Surgery

## 2021-04-20 ENCOUNTER — Other Ambulatory Visit (HOSPITAL_COMMUNITY): Payer: Self-pay

## 2021-04-20 DIAGNOSIS — I214 Non-ST elevation (NSTEMI) myocardial infarction: Secondary | ICD-10-CM

## 2021-04-20 DIAGNOSIS — A419 Sepsis, unspecified organism: Secondary | ICD-10-CM

## 2021-04-20 DIAGNOSIS — I70229 Atherosclerosis of native arteries of extremities with rest pain, unspecified extremity: Secondary | ICD-10-CM

## 2021-04-20 LAB — GLUCOSE, CAPILLARY
Glucose-Capillary: 151 mg/dL — ABNORMAL HIGH (ref 70–99)
Glucose-Capillary: 173 mg/dL — ABNORMAL HIGH (ref 70–99)

## 2021-04-20 LAB — BASIC METABOLIC PANEL
Anion gap: 11 (ref 5–15)
BUN: 31 mg/dL — ABNORMAL HIGH (ref 8–23)
CO2: 26 mmol/L (ref 22–32)
Calcium: 8.1 mg/dL — ABNORMAL LOW (ref 8.9–10.3)
Chloride: 98 mmol/L (ref 98–111)
Creatinine, Ser: 5.42 mg/dL — ABNORMAL HIGH (ref 0.61–1.24)
GFR, Estimated: 11 mL/min — ABNORMAL LOW (ref >60)
Glucose, Bld: 195 mg/dL — ABNORMAL HIGH (ref 70–99)
Potassium: 4.1 mmol/L (ref 3.5–5.1)
Sodium: 135 mmol/L (ref 135–145)

## 2021-04-20 MED ORDER — ATORVASTATIN CALCIUM 40 MG PO TABS
80.0000 mg | ORAL_TABLET | Freq: Every day | ORAL | 2 refills | Status: DC
  Filled 2021-04-20: qty 60, 30d supply, fill #0

## 2021-04-20 MED ORDER — COLLAGENASE 250 UNIT/GM EX OINT
TOPICAL_OINTMENT | Freq: Every day | CUTANEOUS | 0 refills | Status: DC
  Filled 2021-04-20: qty 30, 15d supply, fill #0

## 2021-04-20 MED ORDER — BASAGLAR KWIKPEN 100 UNIT/ML ~~LOC~~ SOPN
5.0000 [IU] | PEN_INJECTOR | Freq: Every day | SUBCUTANEOUS | 1 refills | Status: DC
  Filled 2021-04-20: qty 3, 30d supply, fill #0

## 2021-04-20 MED ORDER — ASPIRIN 81 MG PO TBEC
81.0000 mg | DELAYED_RELEASE_TABLET | Freq: Every day | ORAL | 2 refills | Status: AC
  Filled 2021-04-20: qty 30, 30d supply, fill #0

## 2021-04-20 MED ORDER — ESCITALOPRAM OXALATE 10 MG PO TABS
10.0000 mg | ORAL_TABLET | Freq: Every day | ORAL | 2 refills | Status: DC
  Filled 2021-04-20: qty 30, 30d supply, fill #0

## 2021-04-20 MED ORDER — MUPIROCIN 2 % EX OINT
TOPICAL_OINTMENT | Freq: Every day | CUTANEOUS | 0 refills | Status: DC
  Filled 2021-04-20: qty 22, 15d supply, fill #0

## 2021-04-20 MED ORDER — AMOXICILLIN-POT CLAVULANATE 875-125 MG PO TABS
1.0000 | ORAL_TABLET | Freq: Two times a day (BID) | ORAL | 0 refills | Status: AC
  Filled 2021-04-20: qty 6, 3d supply, fill #0

## 2021-04-20 MED FILL — Lidocaine HCl Local Preservative Free (PF) Inj 1%: INTRAMUSCULAR | Qty: 30 | Status: AC

## 2021-04-20 NOTE — Progress Notes (Signed)
Hemodynamics stable, afebrile. Denied pain. Right groin has minimal bleeding after pt ambulated. Dressing changed twice. No hematoma noted. Will monitor.  Kennyth Lose, RN

## 2021-04-20 NOTE — Progress Notes (Signed)
  Progress Note    04/20/2021 7:36 AM 1 Day Post-Op  Subjective:  no complaints   Vitals:   04/19/21 2231 04/20/21 0417  BP:  (!) 134/58  Pulse:  89  Resp: 18 18  Temp: 97.7 F (36.5 C) 97.8 F (36.6 C)  SpO2:  100%   Physical Exam: Lungs:  non labored Incisions:  R groin cath site without hematoma Extremities:  brisk ATA by doppler; soft PTA by doppler at the ankle Neurologic: A&O  CBC    Component Value Date/Time   WBC 8.9 04/19/2021 1333   RBC 3.13 (L) 04/19/2021 1333   HGB 9.7 (L) 04/19/2021 1333   HCT 30.6 (L) 04/19/2021 1333   PLT 128 (L) 04/19/2021 1333   MCV 97.8 04/19/2021 1333   MCH 31.0 04/19/2021 1333   MCHC 31.7 04/19/2021 1333   RDW 16.1 (H) 04/19/2021 1333    BMET    Component Value Date/Time   NA 135 04/20/2021 0059   K 4.1 04/20/2021 0059   CL 98 04/20/2021 0059   CO2 26 04/20/2021 0059   GLUCOSE 195 (H) 04/20/2021 0059   BUN 31 (H) 04/20/2021 0059   CREATININE 5.42 (H) 04/20/2021 0059   CALCIUM 8.1 (L) 04/20/2021 0059   GFRNONAA 11 (L) 04/20/2021 0059   GFRAA 10 (L) 01/11/2019 0523    INR    Component Value Date/Time   INR 1.2 04/10/2021 2132     Intake/Output Summary (Last 24 hours) at 04/20/2021 0736 Last data filed at 04/20/2021 2641 Gross per 24 hour  Intake 1290.52 ml  Output --  Net 1290.52 ml     Assessment/Plan:  65 y.o. male is s/p   1) US guided right common femoral artery access 2) Left lower extremity angiogram with third order cannulation (13mL total contrast) 3) Conscious sedation (167 minutes) 4) Left popliteal artery intravascular lithotripsy and angioplasty (5x35mm Shockwave) 5) Left posterior tibial artery angioplasty (3x39mm) 1 Day Post-Op   L foot warm with brisk ATA and monophasic PTA Continue current wound care L foot  Continue dual antiplatelet therapy and statin indefinitely Plan is to re-evaluate wounds in 1 month; could consider retrograde pedal access if no improvement noted Ok for d/c from  vascular standpoint   Dagoberto Ligas, PA-C [facility] [phone] 04/20/2021 7:36 AM

## 2021-04-20 NOTE — Progress Notes (Signed)
Pt discharged from unit. Medication/discharge instruction given  Abdelaziz Westenberger K Nusaiba Guallpa, RN  

## 2021-04-20 NOTE — Discharge Summary (Signed)
Physician Discharge Summary  Maxwell Harmon BUL:[id number] DOB: 1956-01-17 DOA: 04/10/2021  PCP: [name], DO  Admit date: 04/10/2021 Discharge date: 04/20/2021  Admitted From: Home Disposition:  Home with home health  Recommendations for Outpatient Follow-up:  Follow up with PCP in 1 week Follow up with vascular surgery as scheduled on 7/19 Follow-up with cardiology as scheduled on 10/27 or sooner if change in condition   Discharge Condition: Stable CODE STATUS: Full code Diet recommendation: Renal diet  Brief/Interim Summary: Maxwell Harmon is a 65 year old male with history of CAD s/p CABG in 2021, preserved EF, Charcot foot, ESRD on peritoneal dialysis presented with cellulitis of left foot/diabetic foot wound.  Patient was admitted to ICU started on broad-spectrum antibiotic for diabetic foot wound.  Nephrology was consulted and patient was switched to hemodialysis due to PD catheter issues. Patient significantly improved so he was transferred out of ICU. Patient underwent left heart catheterization, showed severe multivessel CAD.  Not amenable for further intervention.  Abdominal aortogram showed bilateral popliteal artery occlusion, left lower extremity with single-vessel PT runoff which is diffusely diseased.  Patient subsequently underwent left lower extremity angiogram with left popliteal artery intravascular lithotripsy and angioplasty, left posterior tibial artery angioplasty on 6/15.  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 Sharol Given consulted -Vascular surgery following, status post angiogram 6/15 with revascularization -Vancomycin, Zosyn 6/6 --> Augmentin 6/19 last day to cover total 14 day course    ESRD -Now on intermittent hemodialysis -Nephrology following   NSTEMI -Cardiology consulted, status post heart cath 6/10, severe three-vessel CAD with patent grafts but poor runoff into heavily diseased native circulation.  Continue medical therapy, no other options for revascularization -Continue lipitor    Acute systolic heart failure -Volume management with dialysis   Diabetes mellitus type 2 -Continue Lantus, sliding scale insulin  Acute metabolic encephalopathy -Resolved   Anxiety -Continue lexapro, klonopin    In agreement with assessment of the pressure ulcer as below:  Pressure Injury 04/10/21 Heel Left;Lateral Stage 3 -  Full thickness tissue loss. Subcutaneous fat may be visible but bone, tendon or muscle are NOT exposed. (Active)  04/10/21 2200  Location: Heel  Location Orientation: Left;Lateral  Staging: Stage 3 -  Full thickness tissue loss. Subcutaneous fat may be visible but bone, tendon or muscle are NOT exposed.  Wound Description (Comments):   Present on Admission: Yes     Pressure Injury 04/10/21 Toe (Comment  which one) Anterior;Left Stage 2 -  Partial thickness loss of dermis presenting as a shallow open injury with a red, pink wound bed without slough. (Active)  04/10/21 2200  Location: Toe (Comment  which one)  Location Orientation: Anterior;Left  Staging: Stage 2 -  Partial thickness loss of dermis presenting as a shallow open injury with a red, pink wound bed without slough.  Wound Description (Comments):   Present on Admission: Yes       Discharge Instructions  Discharge Instructions     Call MD for:  difficulty breathing, headache or visual disturbances   Complete by: As directed    Call MD for:   extreme fatigue   Complete by: As directed    Call MD for:  persistant dizziness or light-headedness   Complete by: As directed    Call MD for:  persistant nausea and vomiting   Complete by: As directed    Call MD for:  redness, tenderness, or signs of infection (pain, swelling, redness, odor or green/yellow discharge around incision site)   Complete by: As directed    Call MD for:  severe uncontrolled pain   Complete by: As directed    Call MD for:  temperature >100.4   Complete by: As directed    Discharge instructions   Complete by: As directed    You were cared for by a hospitalist during your hospital stay. If you have any questions about your discharge medications or the care you received while you were in the hospital after you are discharged, you can call the unit and ask to speak with the hospitalist on call if the hospitalist that took care of you is not available. Once you are discharged, your primary care physician will handle any further medical issues. Please note that NO REFILLS for any discharge medications will be authorized once you are discharged, as it is imperative that you return to your primary care physician (or establish a relationship with a primary care physician if you do not have one) for your aftercare needs so that they can reassess your need for medications and monitor your lab values.   Discharge wound care:   Complete by: As directed    Clean the left heel with soap and water, rinse and pat dry. Apply Santyl to the left heel in a nickel thick layer. Cover with a saline moistened gauze, then dry gauze and wrap the foot with a Kerlix. Change daily.  Clean the left great toe with soap and water, dry and apply a small amount of bactroban over the wound, cover with a 2 x 2 and secure with foam dressing. Change daily   Increase activity slowly   Complete by: As directed       Allergies as of 04/20/2021   No Known Allergies      Medication List     STOP  taking these medications    cloNIDine 0.1 MG tablet Commonly known as: CATAPRES   furosemide 40 MG tablet Commonly known as: LASIX   hydrALAZINE 100 MG tablet Commonly known as: APRESOLINE   hydrALAZINE 50 MG tablet Commonly known as: APRESOLINE   HYDROcodone-acetaminophen 5-325 MG tablet Commonly known as: NORCO/VICODIN   metoprolol succinate 25 MG 24 hr tablet Commonly known as: TOPROL-XL   venlafaxine XR 37.5 MG 24 hr capsule Commonly known as: EFFEXOR-XR       TAKE these medications    amoxicillin-clavulanate 875-125 MG tablet Commonly known as: Augmentin Take 1 tablet by mouth 2 (two) times daily for 3 days.   aspirin EC 81 MG tablet Take 1 tablet (81 mg total) by mouth daily. Swallow whole.   atorvastatin 40 MG tablet Commonly known as: LIPITOR Take 2 tablets (80 mg total) by mouth daily. What changed: how much to take   Auryxia 1 GM 210 MG(Fe) tablet Generic drug: ferric citrate Take 630 mg by mouth 3 (three) times daily.   Basaglar KwikPen 100 UNIT/ML Inject 5 Units into the skin at bedtime. What changed: how much to take   [employer]  w/Device Kit 1 each by Does not apply route 4 (four) times daily. Test 4 x daily   blood glucose meter kit and supplies Kit Dispense based on patient and insurance preference. Use up to four times daily as directed. (FOR ICD-9 250.00, 250.01).   calcitRIOL 0.25 MCG capsule Commonly known as: ROCALTROL Take 0.25 mcg by mouth in the morning and at bedtime.   cinacalcet 30 MG tablet Commonly known as: SENSIPAR Take 30 mg by mouth once a week. Take on Mondays   clonazePAM 0.5 MG tablet Commonly known as: KLONOPIN Take 0.5 mg by mouth daily as needed for anxiety.   clopidogrel 75 MG tablet Commonly known as: PLAVIX Take 1 tablet by mouth daily.   collagenase ointment Commonly known as: SANTYL Apply topically daily. Start taking on: April 21, 2021   escitalopram 10 MG tablet Commonly known as:  LEXAPRO Take 1 tablet (10 mg total) by mouth daily. Start taking on: April 21, 2021   fluorouracil 5 % cream Commonly known as: EFUDEX Apply topically 2 (two) times daily.   glucose blood test strip Commonly known as: Estate manager/land agent Use as instructed 4 x daily   Insulin Pen Needle 31G X 8 MM Misc Commonly known as: B-D ULTRAFINE III SHORT PEN 1 each by Does not apply route as directed.   Lancets Misc 1 each by Does not apply route 4 (four) times daily.   mupirocin cream 2 % Commonly known as: BACTROBAN Apply topically daily. Start taking on: April 21, 2021   tamsulosin 0.4 MG Caps capsule Commonly known as: FLOMAX Take 0.4 mg by mouth at bedtime.               Discharge Care Instructions  (From admission, onward)           Start     Ordered   04/20/21 0000  Discharge wound care:       Comments: Clean the left heel with soap and water, rinse and pat dry. Apply Santyl to the left heel in a nickel thick layer. Cover with a saline moistened gauze, then dry gauze and wrap the foot with a Kerlix. Change daily.  Clean the left great toe with soap and water, dry and apply a small amount of bactroban over the wound, cover with a 2 x 2 and secure with foam dressing. Change daily   04/20/21 1046            Follow-up Information     [name], DO. Schedule an appointment as soon as possible for a visit in 1 week(s).   Specialty: Family Medicine Contact information: [place] [address] [phone]                No Known Allergies  Consultations: Cardiology Nephrology Orthopedic surgery Vascular surgery Heart failure team WOC    Procedures/Studies: DG Chest 2 View  Result Date: 04/10/2021 CLINICAL DATA:  Shortness of breath EXAM: CHEST - 2 VIEW COMPARISON:  04/13/2020 FINDINGS: Prior CABG. Mild cardiomegaly and vascular congestion. Bibasilar atelectasis. Suspect small effusions. No acute bony abnormality.  IMPRESSION: Mild cardiomegaly, vascular congestion. Bibasilar atelectasis with small effusions. Electronically Signed   By: Rolm Baptise M.D.   On: 04/10/2021 11:00   DG Tibia/Fibula Left  Result Date: 04/10/2021 CLINICAL DATA:  Left lower extremity nonhealing wound EXAM: LEFT TIBIA AND FIBULA - 2 VIEW COMPARISON:  None. FINDINGS: Three view radiograph left knee demonstrates normal alignment. No fracture or dislocation. No  osseous erosion. No effusion. Advanced vascular calcifications are seen throughout the left lower extremity. IMPRESSION: Peripheral vascular disease.  No osseous erosion. Electronically Signed   By: Fidela Salisbury MD   On: 04/10/2021 20:03   CARDIAC CATHETERIZATION  Result Date: 04/14/2021  Ost RCA to Prox RCA lesion is 100% stenosed.  Prox Cx lesion is 95% stenosed.  Prox LAD to Mid LAD lesion is 100% stenosed.  Dist LAD lesion is 100% stenosed.  Findings: 1. LM ok 2. LAD 100% prox 3. LCX 95% prox 4. RCA 100% prox 5. LIMA to LAD widely patent but LAD is occluded immediately after LIMA plugs in 6. Sequential SVG to OM1 & OM2. Tortuous graft with mild narrowing at insertion to OM1 but otherwise OK. OMs are small 7. Elevated filling pressures with Ao 178/71 (111) LVEDP 36 Assessment: Severe 3v CAD with patent grafts but poor runoff into heavily diseased native circulation. Plan/Discussion: Continue medical therapy. No other options for revascularization. Glori Bickers, MD 5:18 PM   PERIPHERAL VASCULAR CATHETERIZATION  Result Date: 04/19/2021 Formatting of this result is different from the original. [place]: 04/19/2021   PATIENT:  [name]  65 y.o. male   PRE-OPERATIVE DIAGNOSIS:  Atherosclerosis of native arteries of left lower extremity causing ulceration   POST-OPERATIVE DIAGNOSIS:  Same   PROCEDURE:  1) US guided right common femoral artery access 2) Left lower extremity angiogram with third order cannulation (169m total contrast) 3) Conscious sedation (167  minutes) 4) Left popliteal artery intravascular lithotripsy and angioplasty (5x649mShockwave) 5) Left posterior tibial artery angioplasty (3x8027m  SURGEON:  Surgeon(s) and Role:    * HawCherre RobinsD - Primary   ASSISTANT: none   ANESTHESIA:   local and IV sedation   EBL: min   BLOOD ADMINISTERED:none   DRAINS: none   LOCAL MEDICATIONS USED:  LIDOCAINE   SPECIMEN:  none   COUNTS: confirmed correct.   TOURNIQUET:  none   PATIENT DISPOSITION:  PACU - hemodynamically stable.  Delay start of Pharmacological VTE agent (>24hrs) due to surgical blood loss or risk of bleeding: no   INDICATION FOR PROCEDURE: ChaDARL KUSS a 65 90o. male with ulceration of his left foot and severe popliteal and tibial disease demonstrated on angiogram last week. After careful discussion of risks, benefits, and alternatives the patient was offered intervention. We specifically discussed access site complications. The patient understood and wished to proceed.   OPERATIVE FINDINGS: Unchanged appearance of the left lower extremity vasculature from angiogram on Friday. Severe greater than 95% exophytic calcified stenosis artery behind the knee Greater than 95% stenosis in 2 areas of the proximal posterior tibial artery. 80% stenosis and long segment distal posterior tibial artery near the ankle with robust collateralization. I was not able to cross the distalmost posterior tibial artery lesion with a balloon. The remainder of the lesions totally resolved with lithotripsy and angioplasty/   DESCRIPTION OF PROCEDURE: After identification of the patient in the pre-operative holding area, the patient was transferred to the operating room. The patient was positioned supine on the operating room table. Anesthesia was induced. The groins was prepped and draped in standard fashion. A surgical pause was performed confirming correct patient, procedure, and operative location.   The right groin was anesthetized with subcutaneous injection of 1%  lidocaine. Using ultrasound guidance, the right common femoral artery was accessed with micropuncture technique. Fluoroscopy was used to confirm cannulation over the femoral head. Sheathogram was not performed. The 54F  sheath was upsized to 27F.   An 035 glidewire advantage was advanced into the distal aorta. Over the wire an omni flush catheter was advanced to the level of L2. Aortogram was performed - see above for details.   The left common iliac artery was selected with the [place] . The wire was advanced into the popliteal artery.  An 035 catheter was then advanced into the popliteal artery.  It was incredibly difficult to deliver a sheath into the proximal superficial femoral artery.  A number of wire, catheter, and sheath exchanges were tried.  Ultimately a flexible 6 [place] by 55 cm Ansell sheath was advanced into the superficial femoral artery. Selective angiography was performed - see above for details.   The decision was made to interven. The patient was heparinized with 8000 units of heparin.  Activated clotting time measurements were used throughout the case to confirm adequate anticoagulation   The popliteal lesion was crossed with an 035 Glidewire advantage and quick cross catheter.  A confirmatory angiogram confirmed that were in the true lumen.  An 014 wire was advanced in the posterior tibial artery.  The lesion was treated with several treatments of intravascular lithotripsy with shockwave 5 x 60 mm balloon system.  This completely resolved the stenosis. The proximal posterior tibial artery lesions were then angioplastied with a 3 x 80 mm coyote balloon.  This treatment resolved the proximal stenoses. I was able to cross the distal posterior tibial artery lesion with an 014 Glidewire, but was unable to track a low-profile balloon across this lesion.  I ultimately decided to end the case here because of the lengthy course and the significantly improved flow to the foot.   A Perclose device was  used to close the arteriotomy. Hemostasis was excellent upon completion.   Conscious sedation was administered with the use of IV fentanyl and midazolam under continuous physician and nurse monitoring.  Heart rate, blood pressure, and oxygen saturation were continuously monitored.  Total sedation time was 167 minutes   Upon completion of the case instrument and sharps counts were confirmed correct. The patient was transferred to the PACU in good condition. I was present for all portions of the procedure.   PLAN: Dual antiplatelet therapy indefinitely.  High intensity statin therapy indefinitely.  Trial of local wound care.  We will reevaluate in the office in 1 month.  If if wounds are not healing, we will offer retrograde access from the ankle to treat this distalmost lesion.   Yevonne Aline. Stanford Breed, MD [facility] Phone Number: [phone] 04/19/2021 11:58 AM  PERIPHERAL VASCULAR CATHETERIZATION  Result Date: 04/14/2021 Procedure: Abdominal aortogram with bilateral lower extremity runoff, third order catheterization left superficial femoral artery ultrasound right groin Preoperative diagnosis: Nonhealing wound left foot Postoperative diagnosis: Same Anesthesia: Local with IV sedation Operative findings: 1.  Occlusion left popliteal artery with reconstitution of the below-knee popliteal artery and one-vessel posterior tibial runoff severe vessel calcification diffusely 2.  Occlusion right popliteal artery with reconstitution of the posterior tibial artery one-vessel runoff to the right foot Operative details: After obtaining informed consent, the patient was taken the [place] lab.  The patient was placed in supine position on the angio table.  Both groins were prepped and draped in usual sterile fashion.  Local anesthesia was infiltrated over the right common femoral artery.  Ultrasound was used to identify the right common femoral artery and femoral bifurcation.  An introducer  needle was used to  cannulate the right common femoral artery and an 035 versa core wire threaded in the abdominal aorta under fluoroscopic guidance.  A 5 French sheath was then placed over the guidewire and this was thoroughly flushed heparinized saline.  A 5 French pigtail catheter was then advanced over the guidewire to the abdominal aorta.  Abdominal aortogram was obtained in AP projection.  Left renal artery is patent.  Portions of the right renal artery that were visualized were diseased but patent.  At this was not fully visualized since the patient is end-stage renal disease.  The infrarenal abdominal aorta is calcified throughout its wall as well as the left and right external common and internal iliac arteries but they are patent. Next bilateral lower extremity runoff views were obtained through the pigtail catheter.  In the left lower extremity the left common femoral artery profunda femoris artery and superficial femoral arteries are patent.  There again heavily calcified.  There was poor visualization of the vessels distally.  Therefore the pigtail catheter was removed over guidewire and swapped out for a 5 [place] crossover catheter.  An 035 angled Glidewire was then advanced through this all the way down to the mid left superficial femoral artery.  Due to the steep aortic bifurcation an 035 Nava cross was used to go up and over the aortic bifurcation and this was advanced into the distal left superficial femoral artery.  Left lower extremity arteriogram was then obtained.  Left popliteal artery is occluded with diffuse calcific plaque extending from above the knee across the knee joint to the below-knee popliteal artery.  The more distal below-knee popliteal artery is patent without significant stenosis but is definitely calcified in the wall.  The anterior tibial artery and peroneal arteries are occluded.  The posterior tibial artery is the sole runoff vessel to the right foot.  Again this is diffusely  calcified but it is patent all the way into the foot. At this point the Ferd Hibbs cross was removed and additional views of the right lower extremity were obtained through the sheath in the right groin.  The right common femoral profunda femoris is patent.  The right superficial femoral artery is patent.  The right popliteal artery is occluded essentially throughout its course.  The intertibial artery is occluded.  The peroneal artery is occluded.  The posterior tibial artery does reconstitute via collaterals and the sole runoff vessel to the right foot.  It is heavily calcified. At this point Dr. Haroldine Laws proceeded with the cardiac catheterization portion of the case and this is dictated as a separate operative note. Operative management: Patient has fairly heavily calcified arteries but best operation for improved perfusion of his foot is most likely going to be above-knee to below-knee popliteal bypass if his vessels are deemed to heavily calcified for grafting or if he is a high cardiac risk and potentially he would be a candidate for atherectomy of his popliteal artery plus minus stenting.  However, I think this would have a less durable result. Ruta Hinds, MD [facility] [place] Office: [phone]  DG Foot 2 Views Left  Result Date: 04/10/2021 CLINICAL DATA:  Diabetic foot wound EXAM: LEFT FOOT - 2 VIEW COMPARISON:  None. FINDINGS: Two view radiograph left foot demonstrates normal alignment. No fracture or dislocation. No osseous erosion. Advanced vascular calcifications are seen throughout the left foot. Moderate plantar calcaneal spur. Joint spaces are preserved. IMPRESSION: Peripheral vascular disease with advanced vascular calcifications throughout the left foot. No osseous erosion.  Electronically Signed   By: Fidela Salisbury MD   On: 04/10/2021 20:02   VAS [place] ABI WITH/WO TBI  Result Date: 04/11/2021  LOWER EXTREMITY DOPPLER STUDY Patient Name:  RIDWAN BONDY  Date of Exam:    04/11/2021 Medical Rec #: [id number]        Accession #:    [id number] Date of Birth: 03-Dec-1955        Patient Gender: M Patient Age:   65Y Exam Location:  [facility] Procedure:      VAS [place] ABI WITH/WO TBI Referring Phys: [id number] Yevonne Aline HAWKEN --------------------------------------------------------------------------------  Indications: Ulceration, and peripheral artery disease. High Risk Factors: Hypertension, Diabetes, coronary artery disease s/p CABG.  Comparison Study: No prior studies. Performing Technologist: [name] RDMS RVT  Examination Guidelines: A complete evaluation includes at minimum, Doppler waveform signals and systolic blood pressure reading at the level of bilateral brachial, anterior tibial, and posterior tibial arteries, when vessel segments are accessible. Bilateral testing is considered an integral part of a complete examination. Photoelectric Plethysmograph (PPG) waveforms and toe systolic pressure readings are included as required and additional duplex testing as needed. Limited examinations for reoccurring indications may be performed as noted.  ABI Findings: +---------+------------------+-----+-------------------+-----------------------+ Right    Rt Pressure (mmHg)IndexWaveform           Comment                 +---------+------------------+-----+-------------------+-----------------------+ Brachial 117                    triphasic          Unable to obtain due to                                                    IV, pressure obtained                                                      from RT forearm         +---------+------------------+-----+-------------------+-----------------------+ PTA      255               2.18 monophasic                                 +---------+------------------+-----+-------------------+-----------------------+ DP       61                0.52 dampened monophasic                         +---------+------------------+-----+-------------------+-----------------------+ Great Toe29                0.25 Abnormal                                   +---------+------------------+-----+-------------------+-----------------------+ +---------+------------------+-----+-------------------+-------+ Left     Lt Pressure (mmHg)IndexWaveform           Comment +---------+------------------+-----+-------------------+-------+ Brachial  AVF     +---------+------------------+-----+-------------------+-------+ PTA      255               2.18 dampened monophasic        +---------+------------------+-----+-------------------+-------+ DP       255               2.18 dampened monophasic        +---------+------------------+-----+-------------------+-------+ Great Toe0                 0.00 Absent                     +---------+------------------+-----+-------------------+-------+ +-------+-----------+-----------+------------+------------+ ABI/TBIToday's ABIToday's TBIPrevious ABIPrevious TBI +-------+-----------+-----------+------------+------------+ Right  [place]         29                                  +-------+-----------+-----------+------------+------------+ Left   [place]         0                                   +-------+-----------+-----------+------------+------------+ Arterial wall calcification precludes accurate ankle pressures and ABIs.  Summary: Right: Resting right ankle-brachial index indicates noncompressible right lower extremity arteries. The right toe-brachial index is abnormal. ABIs are unreliable. Left: Resting left ankle-brachial index indicates noncompressible left lower extremity arteries. The left toe-brachial index is abnormal. ABIs are unreliable.  *See table(s) above for measurements and observations.  Electronically signed by Ruta Hinds MD on 04/11/2021 at 4:23:15 PM.    Final    VAS [place] LOWER EXTREMITY  SAPHENOUS VEIN MAPPING  Result Date: 04/17/2021 LOWER EXTREMITY VEIN MAPPING Patient Name:  ELWYN KLOSINSKI  Date of Exam:   04/16/2021 Medical Rec #: [id number]        Accession #:    [id number] Date of Birth: Jul 10, 1956        Patient Gender: M Patient Age:   65Y Exam Location:  [facility] Procedure:      VAS [place] LOWER EXTREMITY SAPHENOUS VEIN MAPPING Referring Phys: [id number] Cherre Robins --------------------------------------------------------------------------------  Indications:        Pre-op, and PAD Risk Factors:       Hypertension, Diabetes. Other Risk Factors: ESRD.  Limitations: Bilateral GSV harvesting for CABG 2021 Comparison Study: No prior study Performing Technologist: Sharion Dove RVS  Examination Guidelines: A complete evaluation includes B-mode imaging, spectral Doppler, color Doppler, and power Doppler as needed of all accessible portions of each vessel. Bilateral testing is considered an integral part of a complete examination. Limited examinations for reoccurring indications may be performed as noted. +--------------+-------------+--------------------+-------------+--------------+  RT Diameter   RT Findings         GSV          LT Diameter  LT Findings        (cm)                                          (cm)                    +--------------+-------------+--------------------+-------------+--------------+                    not  Saphenofemoral                not visualized                visualized        Junction                                  +--------------+-------------+--------------------+-------------+--------------+                    not        Proximal thigh       0.25                                   visualized                                                  +--------------+-------------+--------------------+-------------+--------------+                    not          Mid thigh          0.13                                    visualized                                                  +--------------+-------------+--------------------+-------------+--------------+                    not         Distal thigh                 not visualized                visualized                                                  +--------------+-------------+--------------------+-------------+--------------+                    not             Knee            0.19                                   visualized                                                  +--------------+-------------+--------------------+-------------+--------------+      0.24       branching       Prox calf          0.22                    +--------------+-------------+--------------------+-------------+--------------+  0.17                        Mid calf          0.20                    +--------------+-------------+--------------------+-------------+--------------+      0.26       branching      Distal calf         0.24                    +--------------+-------------+--------------------+-------------+--------------+      0.23       branching         Ankle            0.22       branching    +--------------+-------------+--------------------+-------------+--------------+ +----------------+-----------+---------------+----------------+--------------+ RT diameter (cm)RT Findings      SSV      LT Diameter (cm) LT Findings   +----------------+-----------+---------------+----------------+--------------+       0.15                 Popliteal fossa                not visualized +----------------+-----------+---------------+----------------+--------------+       0.19                  Proximal calf       0.18                     +----------------+-----------+---------------+----------------+--------------+       0.18                    Mid calf          0.21                      +----------------+-----------+---------------+----------------+--------------+       0.13                   Distal calf                  not visualized +----------------+-----------+---------------+----------------+--------------+ Diagnosing physician: Jamelle Haring Electronically signed by Jamelle Haring on 04/17/2021 at 10:50:54 AM.    Final    ECHOCARDIOGRAM COMPLETE  Result Date: 04/10/2021    ECHOCARDIOGRAM REPORT   Patient Name:   [name] Date of Exam: 04/10/2021 Medical Rec #:  [id number]       Height:       70.0 in Accession #:    [id number]      Weight:       196.0 lb Date of Birth:  12/26/1955       BSA:          2.069 m Patient Age:    56 years        BP:           111/56 mmHg Patient Gender: M               HR:           67 bpm. Exam Location:  Inpatient Procedure: 2D Echo, Cardiac Doppler, Color Doppler and Intracardiac            Opacification Agent Indications:    Y19.50 Acute diastolic (congestive) heart failure  History:        Patient has no prior history of Echocardiogram examinations.  Prior CABG, Signs/Symptoms:Shortness of Breath and Confusion;                 Risk Factors:Diabetes. ESRD on HD. LEE.  Sonographer:    Merrie Roof RDCS Referring Phys: [phone] PHILIP J NAHSER IMPRESSIONS  1. LV function appears preserved at the base and diminished moving toward apex, akinetic at the apex. This is across all segments. Differential includes takotsubo's cardiomyopathy vs. multivessel CAD. LV thrombus excluded by contrast. Left ventricular ejection fraction, by estimation, is 30 to 35%. The left ventricle has moderately decreased function. The left ventricle demonstrates regional wall motion abnormalities (see scoring diagram/findings for description). The left ventricular internal cavity size was severely dilated. Left ventricular diastolic parameters are indeterminate.  2. Right ventricular systolic function is moderately reduced. The right ventricular size is moderately  enlarged.  3. Left atrial size was moderately dilated.  4. Right atrial size was mildly dilated.  5. The mitral valve is degenerative. Mild to moderate mitral valve regurgitation. No evidence of mitral stenosis.  6. The aortic valve is tricuspid. There is moderate calcification of the aortic valve. There is moderate thickening of the aortic valve. Aortic valve regurgitation is not visualized. Mild aortic valve stenosis.  7. The inferior vena cava is dilated in size with <50% respiratory variability, suggesting right atrial pressure of 15 mmHg. Comparison(s): No prior Echocardiogram. Conclusion(s)/Recommendation(s): Severely reduced LVEF with worse wall motion at the apex compared to the base. Concerning for takotsubo's vs. multivessel CAD/apical LAD. Findings communicated with Dr. Acie Fredrickson. FINDINGS  Left Ventricle: LV function appears preserved at the base and diminished moving toward apex, akinetic at the apex. This is across all segments. Differential includes takotsubo's cardiomyopathy vs. multivessel CAD. LV thrombus excluded by contrast. Left ventricular ejection fraction, by estimation, is 30 to 35%. The left ventricle has moderately decreased function. The left ventricle demonstrates regional wall motion abnormalities. Definity contrast agent was given IV to delineate the left ventricular endocardial borders. The left ventricular internal cavity size was severely dilated. There is borderline left ventricular hypertrophy. Left ventricular diastolic parameters are indeterminate. Right Ventricle: The right ventricular size is moderately enlarged. Right vetricular wall thickness was not well visualized. Right ventricular systolic function is moderately reduced. Left Atrium: Left atrial size was moderately dilated. Right Atrium: Right atrial size was mildly dilated. Pericardium: There is no evidence of pericardial effusion. Mitral Valve: The mitral valve is degenerative in appearance. There is mild thickening of  the mitral valve leaflet(s). There is mild calcification of the mitral valve leaflet(s). Mild to moderate mitral valve regurgitation. No evidence of mitral valve stenosis. Tricuspid Valve: The tricuspid valve is normal in structure. Tricuspid valve regurgitation is trivial. No evidence of tricuspid stenosis. Aortic Valve: The aortic valve is tricuspid. There is moderate calcification of the aortic valve. There is moderate thickening of the aortic valve. Aortic valve regurgitation is not visualized. Mild aortic stenosis is present. Aortic valve mean gradient measures 16.0 mmHg. Aortic valve peak gradient measures 30.0 mmHg. Aortic valve area, by VTI measures 0.96 cm. Pulmonic Valve: The pulmonic valve was not well visualized. Pulmonic valve regurgitation is not visualized. Aorta: The aortic root and ascending aorta are structurally normal, with no evidence of dilitation. Venous: The inferior vena cava is dilated in size with less than 50% respiratory variability, suggesting right atrial pressure of 15 mmHg. IAS/Shunts: The atrial septum is grossly normal.  LEFT VENTRICLE PLAX 2D LVIDd:         6.30 cm LVIDs:  4.30 cm LV PW:         1.20 cm LV IVS:        0.90 cm LVOT diam:     2.00 cm LV SV:         56 LV SV Index:   27 LVOT Area:     3.14 cm  RIGHT VENTRICLE            IVC RV Basal diam:  4.60 cm    IVC diam: 2.40 cm RV Mid diam:    3.90 cm RV S prime:     6.53 cm/s TAPSE (M-mode): 1.5 cm LEFT ATRIUM             Index       RIGHT ATRIUM           Index LA diam:        4.90 cm 2.37 cm/m  RA Area:     15.00 cm LA Vol (A2C):   90.3 ml 43.64 ml/m RA Volume:   42.80 ml  20.68 ml/m LA Vol (A4C):   87.5 ml 42.28 ml/m LA Biplane Vol: 89.3 ml 43.15 ml/m  AORTIC VALVE AV Area (Vmax):    1.00 cm AV Area (Vmean):   1.07 cm AV Area (VTI):     0.96 cm AV Vmax:           274.00 cm/s AV Vmean:          185.000 cm/s AV VTI:            0.576 m AV Peak Grad:      30.0 mmHg AV Mean Grad:      16.0 mmHg LVOT Vmax:          87.20 cm/s LVOT Vmean:        63.200 cm/s LVOT VTI:          0.177 m LVOT/AV VTI ratio: 0.31  AORTA Ao Root diam: 2.90 cm MITRAL VALVE MV Area (PHT): 5.13 cm     SHUNTS MV Decel Time: 148 msec     Systemic VTI:  0.18 m MV E velocity: 127.00 cm/s  Systemic Diam: 2.00 cm MV A velocity: 43.20 cm/s MV E/A ratio:  2.94 Buford Dresser MD Electronically signed by Buford Dresser MD Signature Date/Time: 04/10/2021/4:17:26 PM    Final    VAS [place] LOWER EXTREMITY VENOUS (DVT)  Result Date: 04/11/2021  Lower Venous DVT Study Patient Name:  FRANCOIS ELK  Date of Exam:   04/11/2021 Medical Rec #: [id number]        Accession #:    [id number] Date of Birth: [birth year]        Patient Gender: M Patient Age:   24Y Exam Location:  [facility] Procedure:      VAS [place] LOWER EXTREMITY VENOUS (DVT) Referring Phys: [id number] PHILIP J NAHSER --------------------------------------------------------------------------------  Indications: Swelling, and CHF.  Limitations: Heavy overlying arterial calcification bilaterally. Comparison Study: No prior studies. Performing Technologist: [name] RDMS,RVT  Examination Guidelines: A complete evaluation includes B-mode imaging, spectral Doppler, color Doppler, and power Doppler as needed of all accessible portions of each vessel. Bilateral testing is considered an integral part of a complete examination. Limited examinations for reoccurring indications may be performed as noted. The reflux portion of the exam is performed with the patient in reverse Trendelenburg.  +---------+---------------+---------+-----------+----------+--------------+ RIGHT    CompressibilityPhasicitySpontaneityPropertiesThrombus Aging +---------+---------------+---------+-----------+----------+--------------+ CFV      Full           Yes  Yes                                 +---------+---------------+---------+-----------+----------+--------------+ SFJ      Full                                                         +---------+---------------+---------+-----------+----------+--------------+ FV Prox  Full                                                        +---------+---------------+---------+-----------+----------+--------------+ FV Mid   Full                                                        +---------+---------------+---------+-----------+----------+--------------+ FV DistalFull                                                        +---------+---------------+---------+-----------+----------+--------------+ PFV      Full                                                        +---------+---------------+---------+-----------+----------+--------------+ POP      Full           Yes      Yes                                 +---------+---------------+---------+-----------+----------+--------------+ PTV      Full                                                        +---------+---------------+---------+-----------+----------+--------------+ PERO     Full                                                        +---------+---------------+---------+-----------+----------+--------------+   +---------+---------------+---------+-----------+----------+--------------+ LEFT     CompressibilityPhasicitySpontaneityPropertiesThrombus Aging +---------+---------------+---------+-----------+----------+--------------+ CFV      Full           Yes      Yes                                 +---------+---------------+---------+-----------+----------+--------------+ SFJ      Full                                                        +---------+---------------+---------+-----------+----------+--------------+  FV Prox  Full                                                        +---------+---------------+---------+-----------+----------+--------------+ FV Mid   Full                                                         +---------+---------------+---------+-----------+----------+--------------+ FV DistalFull                                                        +---------+---------------+---------+-----------+----------+--------------+ PFV      Full                                                        +---------+---------------+---------+-----------+----------+--------------+ POP      Full           Yes      Yes                                 +---------+---------------+---------+-----------+----------+--------------+ PTV      Full                                                        +---------+---------------+---------+-----------+----------+--------------+ PERO     Full                                                        +---------+---------------+---------+-----------+----------+--------------+     Summary: RIGHT: - There is no evidence of deep vein thrombosis in the lower extremity.  - No cystic structure found in the popliteal fossa.  LEFT: - There is no evidence of deep vein thrombosis in the lower extremity.  - No cystic structure found in the popliteal fossa.  *See table(s) above for measurements and observations. Electronically signed by Ruta Hinds MD on 04/11/2021 at 4:27:28 PM.    Final    VAS [place] UPPER EXT VEIN MAPPING (PRE-OP AVF)  Result Date: 04/17/2021 UPPER EXTREMITY VEIN MAPPING Patient Name:  JHADEN PIZZUTO  Date of Exam:   04/16/2021 Medical Rec #: [id number]        Accession #:    [id number] Date of Birth: [birth year]        Patient Gender: M Patient Age:   65Y Exam Location:  [facility] Procedure:      VAS [place] UPPER EXT VEIN MAPPING (PRE-OP AVF) Referring Phys: [id number] Cherre Robins --------------------------------------------------------------------------------  Indications: History  of PAD; patient is pre-operative for bypass. History: ESRD (left arm dialysis access), HTN, DM, CAD, status post CABG.  Limitations: Multiple IVs in right arm, left arm  dialysis access Comparison Study: No prior study on file Performing Technologist: Sharion Dove RVS  Examination Guidelines: A complete evaluation includes B-mode imaging, spectral Doppler, color Doppler, and power Doppler as needed of all accessible portions of each vessel. Bilateral testing is considered an integral part of a complete examination. Limited examinations for reoccurring indications may be performed as noted. +-----------------+-------------+----------+--------------+ Right Cephalic   Diameter (cm)Depth (cm)   Findings    +-----------------+-------------+----------+--------------+ Prox upper arm       0.13        0.34                  +-----------------+-------------+----------+--------------+ Mid upper arm        0.17        0.24                  +-----------------+-------------+----------+--------------+ Dist upper arm       0.15        0.26                  +-----------------+-------------+----------+--------------+ Antecubital fossa    0.20        1.84                  +-----------------+-------------+----------+--------------+ Prox forearm         0.22        2.16                  +-----------------+-------------+----------+--------------+ Mid forearm                             not visualized +-----------------+-------------+----------+--------------+ Dist forearm                            not visualized +-----------------+-------------+----------+--------------+ Wrist                                   not visualized +-----------------+-------------+----------+--------------+ +-----------------+-------------+----------+----------------+ Right Basilic    Diameter (cm)Depth (cm)    Findings     +-----------------+-------------+----------+----------------+ Prox upper arm       0.70        0.89                    +-----------------+-------------+----------+----------------+ Mid upper arm        0.66        1.15                     +-----------------+-------------+----------+----------------+ Dist upper arm       0.58        1.17                    +-----------------+-------------+----------+----------------+ Antecubital fossa    0.57        1.02                    +-----------------+-------------+----------+----------------+ Prox forearm                             not visualized  +-----------------+-------------+----------+----------------+ Mid forearm          0.50  0.36   IV noted in vein +-----------------+-------------+----------+----------------+ Distal forearm       0.50        0.32   IV noted in vein +-----------------+-------------+----------+----------------+ Wrist                                    not visualized  +-----------------+-------------+----------+----------------+ Left arm not mapped secondary to dialysis access *See table(s) above for measurements and observations.  Diagnosing physician: Jamelle Haring Electronically signed by Jamelle Haring on 04/17/2021 at 10:51:28 AM.    Final        Discharge Exam: Vitals:   04/20/21 0417 04/20/21 0815  BP: (!) 134/58 (!) 91/53  Pulse: 89 79  Resp: 18 20  Temp: 97.8 F (36.6 C) 97.7 F (36.5 C)  SpO2: 100%      General: Pt is alert, awake, not in acute distress Cardiovascular: RRR, S1/S2 +, no edema Respiratory: CTA bilaterally, no wheezing, no rhonchi, no respiratory distress, no conversational dyspnea  Abdominal: Soft, NT, ND, bowel sounds + Extremities: no edema, no cyanosis, left lower extremity wrapped in clean and dry dressing  Psych: Normal mood and affect, stable judgement and insight     The results of significant diagnostics from this hospitalization (including imaging, microbiology, ancillary and laboratory) are listed below for reference.     Microbiology: Recent Results (from the past 240 hour(s))  Resp Panel by RT-PCR (Flu A&B, Covid) Nasopharyngeal Swab     Status: None   Collection Time: 04/10/21 11:50  AM   Specimen: Nasopharyngeal Swab; Nasopharyngeal(NP) swabs in vial transport medium  Result Value Ref Range Status   SARS Coronavirus 2 by RT PCR NEGATIVE NEGATIVE Final    Comment: (NOTE) SARS-CoV-2 target nucleic acids are NOT DETECTED.  The SARS-CoV-2 RNA is generally detectable in upper respiratory specimens during the acute phase of infection. The lowest concentration of SARS-CoV-2 viral copies this assay can detect is 138 copies/mL. A negative result does not preclude SARS-Cov-2 infection and should not be used as the sole basis for treatment or other patient management decisions. A negative result may occur with  improper specimen collection/handling, submission of specimen other than nasopharyngeal swab, presence of viral mutation(s) within the areas targeted by this assay, and inadequate number of viral copies(<138 copies/mL). A negative result must be combined with clinical observations, patient history, and epidemiological information. The expected result is Negative.  Fact Sheet for Patients:  EntrepreneurPulse.com.au  Fact Sheet for Healthcare Providers:  IncredibleEmployment.be  This test is no t yet approved or cleared by the [place] FDA and  has been authorized for detection and/or diagnosis of SARS-CoV-2 by FDA under an Emergency Use Authorization (EUA). This EUA will remain  in effect (meaning this test can be used) for the duration of the COVID-19 declaration under Section 564(b)(1) of the Act, 21 U.S.C.section 360bbb-3(b)(1), unless the authorization is terminated  or revoked sooner.       Influenza A by PCR NEGATIVE NEGATIVE Final   Influenza B by PCR NEGATIVE NEGATIVE Final    Comment: (NOTE) The Xpert Xpress SARS-CoV-2/FLU/RSV plus assay is intended as an aid in the diagnosis of influenza from Nasopharyngeal swab specimens and should not be used as a sole basis for treatment. Nasal washings and aspirates are  unacceptable for Xpert Xpress SARS-CoV-2/FLU/RSV testing.  Fact Sheet for Patients: EntrepreneurPulse.com.au  Fact Sheet for Healthcare Providers: IncredibleEmployment.be  This test is not yet approved or  cleared by the [place] and has been authorized for detection and/or diagnosis of SARS-CoV-2 by FDA under an Emergency Use Authorization (EUA). This EUA will remain in effect (meaning this test can be used) for the duration of the COVID-19 declaration under Section 564(b)(1) of the Act, 21 U.S.C. section 360bbb-3(b)(1), unless the authorization is terminated or revoked.  Performed at [facility], [place] [address], [place], [place] [address]   Culture, blood (routine x 2)     Status: None   Collection Time: 04/10/21 10:24 PM   Specimen: BLOOD  Result Value Ref Range Status   Specimen Description BLOOD SITE NOT SPECIFIED  Final   Special Requests   Final    BOTTLES DRAWN AEROBIC AND ANAEROBIC Blood Culture adequate volume   Culture   Final    NO GROWTH 5 DAYS Performed at [facility], [address], [place], [place] [address]    Report Status 04/16/2021 FINAL  Final  Culture, blood (routine x 2)     Status: None   Collection Time: 04/10/21 10:24 PM   Specimen: BLOOD  Result Value Ref Range Status   Specimen Description BLOOD SITE NOT SPECIFIED  Final   Special Requests   Final    BOTTLES DRAWN AEROBIC AND ANAEROBIC Blood Culture adequate volume   Culture   Final    NO GROWTH 5 DAYS Performed at [facility], [address] [place], [place] [address]    Report Status 04/16/2021 FINAL  Final  MRSA PCR Screening     Status: None   Collection Time: 04/11/21 10:16 AM   Specimen: Nasal Mucosa; Nasopharyngeal  Result Value Ref Range Status   MRSA by PCR NEGATIVE NEGATIVE Final    Comment:        The GeneXpert MRSA Assay (FDA approved for NASAL specimens only), is one component of a comprehensive MRSA  colonization surveillance program. It is not intended to diagnose MRSA infection nor to guide or monitor treatment for MRSA infections. Performed at [facility], [place] [address] [place], [place] [address]      Labs: BNP (last 3 results) Recent Labs    04/10/21 1033  BNP 8,502.7*   Basic Metabolic Panel: Recent Labs  Lab 04/14/21 1836 04/15/21 0330 04/16/21 0231 04/17/21 0159 04/19/21 1333 04/20/21 0059  NA 135 135 135 133* 138 135  K 3.9 4.0 3.8 4.2 4.0 4.1  CL 97* 97* 99 96* 101 98  CO2 _0 GLUCOSE 114* 95 100* 144* 119* 195*  BUN 24* 31* 26* 40* 21 31*  CREATININE 4.49*  4.56* 5.16* 4.31* 5.87* 4.50* 5.42*  CALCIUM 8.2* 8.1* 8.2* 8.0* 8.4* 8.1*  PHOS 3.7  --   --   --   --   --    Liver Function Tests: Recent Labs  Lab 04/14/21 1836  ALBUMIN 2.2*   No results for input(s): LIPASE, AMYLASE in the last 168 hours. No results for input(s): AMMONIA in the last 168 hours. CBC: Recent Labs  Lab 04/14/21 0340 04/14/21 1836 04/15/21 0330 04/17/21 0702 04/19/21 1333  WBC 6.3 6.3 7.0 8.2 8.9  HGB 9.1* 9.3* 8.8* 9.1* 9.7*  HCT 28.8* 29.9* 28.0* 29.6* 30.6*  MCV 98.6 98.0 96.9 97.4 97.8  PLT 115* 114* 127* 144* 128*   Cardiac Enzymes: No results for input(s): CKTOTAL, CKMB, CKMBINDEX, TROPONINI in the last 168 hours. BNP: Invalid input(s): POCBNP CBG: Recent Labs  Lab 04/19/21 1220  04/19/21 1335 04/19/21 1628 04/19/21 2112 04/20/21 0607  GLUCAP 118* 113* 137* 203* 151*   D-Dimer No results for input(s): DDIMER in the last 72 hours. Hgb A1c No results for input(s): HGBA1C in the last 72 hours. Lipid Profile No results for input(s): CHOL, HDL, LDLCALC, TRIG, CHOLHDL, LDLDIRECT in the last 72 hours. Thyroid function studies No results for input(s): TSH, T4TOTAL, T3FREE, THYROIDAB in the last 72 hours.  Invalid input(s): FREET3 Anemia work up No results for input(s): VITAMINB12, FOLATE, FERRITIN, TIBC, IRON, RETICCTPCT in the  last 72 hours. Urinalysis No results found for: COLORURINE, APPEARANCEUR, [place], [place], GLUCOSEU, [place], [place], [place], PROTEINUR, UROBILINOGEN, NITRITE, LEUKOCYTESUR Sepsis Labs Invalid input(s): PROCALCITONIN,  WBC,  LACTICIDVEN Microbiology Recent Results (from the past 240 hour(s))  Resp Panel by RT-PCR (Flu A&B, Covid) Nasopharyngeal Swab     Status: None   Collection Time: 04/10/21 11:50 AM   Specimen: Nasopharyngeal Swab; Nasopharyngeal(NP) swabs in vial transport medium  Result Value Ref Range Status   SARS Coronavirus 2 by RT PCR NEGATIVE NEGATIVE Final    Comment: (NOTE) SARS-CoV-2 target nucleic acids are NOT DETECTED.  The SARS-CoV-2 RNA is generally detectable in upper respiratory specimens during the acute phase of infection. The lowest concentration of SARS-CoV-2 viral copies this assay can detect is 138 copies/mL. A negative result does not preclude SARS-Cov-2 infection and should not be used as the sole basis for treatment or other patient management decisions. A negative result may occur with  improper specimen collection/handling, submission of specimen other than nasopharyngeal swab, presence of viral mutation(s) within the areas targeted by this assay, and inadequate number of viral copies(<138 copies/mL). A negative result must be combined with clinical observations, patient history, and epidemiological information. The expected result is Negative.  Fact Sheet for Patients:  EntrepreneurPulse.com.au  Fact Sheet for Healthcare Providers:  IncredibleEmployment.be  This test is no t yet approved or cleared by the [place] FDA and  has been authorized for detection and/or diagnosis of SARS-CoV-2 by FDA under an Emergency Use Authorization (EUA). This EUA will remain  in effect (meaning this test can be used) for the duration of the COVID-19 declaration under Section 564(b)(1) of the Act, 21 U.S.C.section  360bbb-3(b)(1), unless the authorization is terminated  or revoked sooner.       Influenza A by PCR NEGATIVE NEGATIVE Final   Influenza B by PCR NEGATIVE NEGATIVE Final    Comment: (NOTE) The Xpert Xpress SARS-CoV-2/FLU/RSV plus assay is intended as an aid in the diagnosis of influenza from Nasopharyngeal swab specimens and should not be used as a sole basis for treatment. Nasal washings and aspirates are unacceptable for Xpert Xpress SARS-CoV-2/FLU/RSV testing.  Fact Sheet for Patients: EntrepreneurPulse.com.au  Fact Sheet for Healthcare Providers: IncredibleEmployment.be  This test is not yet approved or cleared by the [place] FDA and has been authorized for detection and/or diagnosis of SARS-CoV-2 by FDA under an Emergency Use Authorization (EUA). This EUA will remain in effect (meaning this test can be used) for the duration of the COVID-19 declaration under Section 564(b)(1) of the Act, 21 U.S.C. section 360bbb-3(b)(1), unless the authorization is terminated or revoked.  Performed at [facility], [place] [address], [place], [place] [address]   Culture, blood (routine x 2)     Status: None   Collection Time: 04/10/21 10:24 PM   Specimen: BLOOD  Result Value Ref Range Status   Specimen Description BLOOD SITE NOT SPECIFIED  Final   Special Requests  Final    BOTTLES DRAWN AEROBIC AND ANAEROBIC Blood Culture adequate volume   Culture   Final    NO GROWTH 5 DAYS Performed at [facility], [place] [address] [place], [place] [address]    Report Status 04/16/2021 FINAL  Final  Culture, blood (routine x 2)     Status: None   Collection Time: 04/10/21 10:24 PM   Specimen: BLOOD  Result Value Ref Range Status   Specimen Description BLOOD SITE NOT SPECIFIED  Final   Special Requests   Final    BOTTLES DRAWN AEROBIC AND ANAEROBIC Blood Culture adequate volume   Culture   Final    NO GROWTH 5 DAYS Performed at [facility], [address], [place], [place] [address]    Report Status 04/16/2021 FINAL  Final  MRSA PCR Screening     Status: None   Collection Time: 04/11/21 10:16 AM   Specimen: Nasal Mucosa; Nasopharyngeal  Result Value Ref Range Status   MRSA by PCR NEGATIVE NEGATIVE Final    Comment:        The GeneXpert MRSA Assay (FDA approved for NASAL specimens only), is one component of a comprehensive MRSA colonization surveillance program. It is not intended to diagnose MRSA infection nor to guide or monitor treatment for MRSA infections. Performed at [facility], [place] [address], [place], [place] [address]      Patient was seen and examined on the day of discharge and was found to be in stable condition. Time coordinating discharge: 35 minutes including assessment and coordination of care, as well as examination of the patient.   SIGNED:  Dessa Phi, DO [facility] 04/20/2021, 10:49 AM

## 2021-04-20 NOTE — Progress Notes (Signed)
Renal Navigator appreciates update from Attending that patient is medically ready for discharge. Navigator called patient's outpatient clinic/[facility] that he is being discharged today and will be at his in-center seat tomorrow, Friday 04/21/21 11:30am. Per clinic staff, patient's father has been providing transportation for him recently. Navigator faxed Nephrology note, VVS op note and Discharge Summary to clinic to provide continuity of care.   Alphonzo Cruise, [place] Renal Navigator [phone]

## 2021-04-20 NOTE — Progress Notes (Signed)
[facility] Progress Note   Assessment/ Plan:   Dialysis Orders: Center: [facility], [place], [place]  on CCPD with 3 exchanges, 2700 ml fill volume, fill time 30 minutes, drain time 30 minutes, dwell time 2 hours.   NEPHROLOGIST: Claris Gladden  LOCATION: [place]  SCHEDULE: M-W-F 2nd Shift  EDW: TBD kg.  KIDNEY:  LITERS PROC: liters/treatment  HD TIME: 4 hrs. 0 min.  ACCESS: L Upper Arm AVF  NEEDLE SIZE: 15 ga.  ANTICOAG: Standard Heparin Per Protocol  BATH: 2.0 K-2.5Ca 35 HCO3  QB: 500 ml/min  QD: Auto Flow Protocol     Assessment/Plan: NSTEMI - Cardiology consulted and has EKG changes.  ECHO with significant decrease in EF from April 2022.  LHC 04/14/21 revealed ost RCA to prox rca 100%, prox Cx 95%, prox LAD to mid LAD is 100%, dist LAD 100% stenosed, LIMA to LAD widely patent, but LAD occluded immediately after LIMA, elevated filling pressures LVEDP 36.    Acute systolic CHF - EF 70-35% (down from 55% in April).  HF consulted   ESRD -  Normally does CCPD at home but had issues with machine and draining so had sporadic treatments and was set up for IHD day he presented to [facility] ED.  Transitioned to IHD due to inadequate drainage/UF with CCPD (likely catheter malfunction). Will keep MWF schedule for now and will need outpatient arrangements in [place] prior to discharge.  Will need to have PD catheter evaluated as an outpatient.     Given miralax daily to help with function of PD cath and cont with weekly flushes to keep it patent. Will continue with IHD for now and will need to f/u with his primary nephrologist to decide about resuming CCPD or continue with IHD S/p HD 6/14 since he had angio Wednesday.  Then next HD will be Friday.   Last PD catheter flush was Monday 04/17/21  Hypertension/volume  - volume overload and plan for HD with UF as above.  ECHO with drop in EF   Anemia  - due to ESRD.  Will dose with ESA and transfuse prn.   Metabolic bone disease -  Continue with home meds  Nutrition -  Renal diet, carb modified Vascular access:  LUE AVF +T/B Diabetic heel/foot ulcer - Vascular surgery consulted as well as Dr. Sharol Given.  Currently on vanc/zosyn. s/p angio 6/15 with lithotripsy of lesion Hypokalemia - will replete prn Hypomagnesemia - replete prn Dispo: may need SNF    Subjective:    For d/c today.  No issues   Objective:   BP (!) 91/53 (BP Location: Right Leg)   Pulse 79   Temp 97.7 F (36.5 C) (Oral)   Resp 20   Ht 5\' 11"  (1.803 m)   Wt 86.5 kg   SpO2 100%   BMI 26.61 kg/m   Physical Exam: Gen: NAD, sitting in bed CVS: RRR Resp: clear Abd: PD cath in L quadrant Ext: minimal edema ACCESS: L AVF + T/B, PD catheter as well  Labs: BMET Recent Labs  Lab 04/14/21 0340 04/14/21 1836 04/15/21 0330 04/16/21 0231 04/17/21 0159 04/19/21 1333 04/20/21 0059  NA 136 135 135 135 133* 138 135  K 4.7 3.9 4.0 3.8 4.2 4.0 4.1  CL 98 97* 97* 99 96* 101 98  CO2 26 25 25 27 24 28 26   GLUCOSE 120* 114* 95 100* 144* 119* 195*  BUN 50* 24* 31* 26* 40* 21 31*  CREATININE 6.80* 4.49*  4.56*  5.16* 4.31* 5.87* 4.50* 5.42*  CALCIUM 8.5* 8.2* 8.1* 8.2* 8.0* 8.4* 8.1*  PHOS  --  3.7  --   --   --   --   --    CBC Recent Labs  Lab 04/14/21 1836 04/15/21 0330 04/17/21 0702 04/19/21 1333  WBC 6.3 7.0 8.2 8.9  HGB 9.3* 8.8* 9.1* 9.7*  HCT 29.9* 28.0* 29.6* 30.6*  MCV 98.0 96.9 97.4 97.8  PLT 114* 127* 144* 128*      Medications:     atorvastatin  80 mg Oral Daily   collagenase   Topical Daily   escitalopram  10 mg Oral Daily   gentamicin cream  1 application Topical Daily   heparin  5,000 Units Subcutaneous Q8H   insulin aspart  0-6 Units Subcutaneous TID WC   insulin glargine  5 Units Subcutaneous QHS   mupirocin cream   Topical Daily   pantoprazole  40 mg Oral Q supper   sodium chloride flush  3 mL Intravenous Q12H   sodium chloride flush  3 mL Intravenous Q12H   vancomycin variable dose  per unstable renal function (pharmacist dosing)   Does not apply See admin instructions     Madelon Lips MD 04/20/2021, 11:04 AM

## 2021-04-20 NOTE — TOC Transition Note (Signed)
Transition of Care (TOC) - CM/SW Discharge Note Marvetta Gibbons RN, BSN Transitions of Care Unit 4E- RN Case Manager See Treatment Team for direct phone #    Patient Details  Name: Maxwell Harmon MRN: [id number] Date of Birth: [birth year]  Transition of Care [facility]) CM/SW Contact:  Dawayne Patricia, RN Phone Number: 04/20/2021, 3:03 PM   Clinical Narrative:    Pt stable for transition home today, wife to transport home. Orders placed for HHRN/PT.  CM in to speak with pt and wife regarding HH needs. Per wife pt has Rw at home, HD has been set up for M/W/F in [place].  Pt agreeable to [facility] services. List provided for [facility] choice Per CMS guidelines from medicare.gov website with star ratings (copy placed in shadow chart), wife has selected [facility] as first choice with [facility] as backup.   Confirmed street address as [address] [place], [address]. Phone # and PCP correct in epic.   Call made to [facility] [phone]), spoke with Rolla Plate and referral has been accepted, there is a slight delay for start of care till next tues- June 21- spoke with wife who is agreeable to this as pt has HD tomorrow and Monday.  Info Faxed to [facility]- [phone]   Final next level of care: [place] Barriers to Discharge: No Barriers Identified, Barriers Resolved   Patient Goals and CMS Choice Patient states their goals for this hospitalization and ongoing recovery are:: return home CMS Medicare.gov Compare Post Acute Care list provided to:: Patient Represenative (must comment) Choice offered to / list presented to : Patient, Spouse  Discharge Placement           Home with [facility]            Discharge Plan and Services In-house Referral: Clinical Social Work Discharge Planning Services: CM Consult Post Acute Care Choice: Home Health          DME Arranged: N/A DME Agency: NA       HH Arranged: RN, PT, Disease Management [place] Agency: [facility] Date HH Agency  Contacted: 04/20/21 Time [place]: 1502 Representative spoke with at [place]: [place] Determinants of Health (SDOH) Interventions Food Insecurity Interventions: Intervention Not Indicated Financial Strain Interventions: Intervention Not Indicated Housing Interventions: Intervention Not Indicated Transportation Interventions: Intervention Not Indicated   Readmission Risk Interventions Readmission Risk Prevention Plan 04/20/2021  Transportation Screening Complete  PCP or Specialist Appt within 3-5 Days Complete  HRI or Home Care Consult Complete  Social Work Consult for [place] Planning/Counseling Complete  Palliative Care Screening Not Applicable  Medication Review Press photographer) Complete  Some recent data might be hidden

## 2021-04-20 NOTE — Progress Notes (Signed)
Physical Therapy Treatment Patient Details Name: Maxwell Harmon MRN: [id number] DOB: [birth year] Today's Date: 04/20/2021    History of Present Illness Patient is a 65 y/o male who presents on 6/6 with SOB and weakness. Found to have sepsis due to LLE cellulitis, acute encephalopathy due to uremia-started on HD and acute CHF. s/p Abdominal aortogram 6/10 showed bilateral popliteal artery occlusion, LLE with single-vessel PT runoff which is diffusely diseased. Bypass surgery for LLE PAD scheduled for 04/19/21. PMH includes CAD s/p CABG in 2021, preserved EF, Charcot foot, ESRD on peritoneal dialysis, HTN, TIAs.    PT Comments    Pt has progressed well toward his goals.  Emphasis on sit to stand and progression of gait with the RW.  Pt and wife felt comfortable with pt's level of mobility given L foot wounds and neuropathy bilaterally.   Follow Up Recommendations  Home health PT;Supervision for mobility/OOB     Equipment Recommendations  3in1 (PT)    Recommendations for Other Services       Precautions / Restrictions Precautions Precautions: Fall Precaution Comments: charcot foot, ulcer left heel    Mobility  Bed Mobility               General bed mobility comments: OOB on arrival    Transfers Overall transfer level: Needs assistance Equipment used: Rolling walker (2 wheeled) Transfers: Sit to/from Stand Sit to Stand: Supervision            Ambulation/Gait Ambulation/Gait assistance: Min guard Gait Distance (Feet): 160 Feet Assistive device: Rolling walker (2 wheeled) Gait Pattern/deviations: Step-through pattern;Decreased stride length;Decreased stance time - left Gait velocity: decreased Gait velocity interpretation: <1.8 ft/sec, indicate of risk for recurrent falls General Gait Details: still favoring the L foot with less heel toe pattern and more flat contact.  Generally steady with the RW.  Wife less concerned after seeing pt ambulate with the  RW.   Stairs             Wheelchair Mobility    Modified Rankin (Stroke Patients Only)       Balance Overall balance assessment: Needs assistance Sitting-balance support: Feet supported;No upper extremity supported Sitting balance-Leahy Scale: Good     Standing balance support: During functional activity Standing balance-Leahy Scale: Poor Standing balance comment: Requires UE support in standing.                            Cognition Arousal/Alertness: Awake/alert Behavior During Therapy: Flat affect Overall Cognitive Status: Within Functional Limits for tasks assessed                                        Exercises      General Comments        Pertinent Vitals/Pain Pain Assessment: Faces Faces Pain Scale: Hurts little more Pain Location: left heel Pain Descriptors / Indicators: Tender;Sore Pain Intervention(s): Monitored during session    Home Living                      Prior Function            PT Goals (current goals can now be found in the care plan section) Acute Rehab PT Goals Patient Stated Goal: to get better PT Goal Formulation: With patient/family Time For Goal Achievement: 05/01/21 Potential to Achieve Goals: Good Progress towards PT  goals: Not progressing toward goals - comment    Frequency    Min 3X/week      PT Plan Current plan remains appropriate    Co-evaluation              AM-PAC PT "6 Clicks" Mobility   Outcome Measure  Help needed turning from your back to your side while in a flat bed without using bedrails?: None Help needed moving from lying on your back to sitting on the side of a flat bed without using bedrails?: None Help needed moving to and from a bed to a chair (including a wheelchair)?: A Little Help needed standing up from a chair using your arms (e.g., wheelchair or bedside chair)?: A Little Help needed to walk in hospital room?: A Little Help needed climbing  3-5 steps with a railing? : A Little 6 Click Score: 20    End of Session   Activity Tolerance: Patient tolerated treatment well Patient left: in bed;with family/visitor present;with call bell/phone within reach Nurse Communication: Mobility status PT Visit Diagnosis: Unsteadiness on feet (R26.81);Muscle weakness (generalized) (M62.81);Difficulty in walking, not elsewhere classified (R26.2)     Time: 1350-1420 PT Time Calculation (min) (ACUTE ONLY): 30 min  Charges:  $Gait Training: 8-22 mins $Therapeutic Activity: 8-22 mins                     04/20/2021  Ginger Carne., PT Acute Rehabilitation Services [phone]  (pager) [phone]  (office)   Tessie Fass Epsie Walthall 04/20/2021, 2:34 PM

## 2021-04-27 ENCOUNTER — Ambulatory Visit: Payer: Managed Care, Other (non HMO) | Admitting: Orthopedic Surgery

## 2021-05-02 ENCOUNTER — Ambulatory Visit (INDEPENDENT_AMBULATORY_CARE_PROVIDER_SITE_OTHER): Payer: Medicare Other | Admitting: Orthopedic Surgery

## 2021-05-02 ENCOUNTER — Encounter: Payer: Self-pay | Admitting: Orthopedic Surgery

## 2021-05-02 DIAGNOSIS — I87333 Chronic venous hypertension (idiopathic) with ulcer and inflammation of bilateral lower extremity: Secondary | ICD-10-CM

## 2021-05-02 DIAGNOSIS — L97421 Non-pressure chronic ulcer of left heel and midfoot limited to breakdown of skin: Secondary | ICD-10-CM | POA: Diagnosis not present

## 2021-05-02 DIAGNOSIS — I739 Peripheral vascular disease, unspecified: Secondary | ICD-10-CM | POA: Diagnosis not present

## 2021-05-02 DIAGNOSIS — L97919 Non-pressure chronic ulcer of unspecified part of right lower leg with unspecified severity: Secondary | ICD-10-CM

## 2021-05-02 DIAGNOSIS — L97929 Non-pressure chronic ulcer of unspecified part of left lower leg with unspecified severity: Secondary | ICD-10-CM

## 2021-05-02 MED ORDER — PENTOXIFYLLINE ER 400 MG PO TBCR: 400.0000 mg | EXTENDED_RELEASE_TABLET | Freq: Three times a day (TID) | ORAL | 3 refills | Status: DC

## 2021-05-02 MED ORDER — NITROGLYCERIN 0.2 MG/HR TD PT24: 0.2000 mg | MEDICATED_PATCH | Freq: Every day | TRANSDERMAL | 12 refills | Status: DC

## 2021-05-02 NOTE — Progress Notes (Signed)
Office Visit Note   Patient: Maxwell Harmon           Date of Birth: 1956-11-01           MRN: [id number] Visit Date: 05/02/2021              Requested by: [name], [place] [address] PCP: [name], DO  Chief Complaint  Patient presents with   Left Foot - Wound Check      HPI: Patient is a 65 year old gentleman who is seen in follow-up for ischemic ulcer lateral left heel status post revascularization with vascular vein surgery.  Patient states he still has pain in the foot.  Assessment & Plan: Visit Diagnoses:  1. Non-pressure chronic ulcer of left heel and midfoot limited to breakdown of skin ([place])   2. PVD (peripheral vascular disease) ([place])     Plan: Patient is given a PRAFO that he will wear around-the-clock to protect the heel and may wear sneakers for ambulating short distances.  He is placed in a knee-high compression stocking he will wear this around-the-clock and go to [facility] to obtain a size large sock.  We will start him on a nitroglycerin patch to be placed over the dorsalis pedis artery and this will be changed daily with the compression sock.  A prescription is also called in for Trental to help the microcirculation.  We will follow-up closely next week.  Family was brought into the room to explain the treatment protocol.  Patient will wear the compression stocking for both lower extremities for the venous insufficiency.  Follow-Up Instructions: Return in about 1 week (around 05/09/2021).   Ortho Exam  Patient is alert, oriented, no adenopathy, well-dressed, normal affect, normal respiratory effort. Examination patient's left foot is still cold has some mottled ischemic changes and some ischemic ulcers over the toes.  There is a large ischemic ulcer over the lateral aspect the left heel that is 4 x 6 cm this is flat.  The Doppler was used and patient has a strong dorsalis pedis and posterior tibial biphasic  pulse.  Excellent revascularization flow to the ankle but patient seems to be having microcirculatory disease with the healing of the foot ulcers.  Patient has venous insufficiency of both lower extremities with pitting edema brawny skin color changes and small ulcers on the right leg.  The left calf is 37 cm in circumference the right calf is 37.5 cm in circumference.  The Charcot collapse of the right foot is stable his foot is flat and will hold on orthopedic shoes for the right foot until the left foot is healed.  Imaging: No results found. No images are attached to the encounter.  Labs: Lab Results  Component Value Date   HGBA1C 8.2 (H) 04/10/2021   HGBA1C 6.9 (H) 01/02/2019   HGBA1C 7.5 11/08/2015   REPTSTATUS 04/16/2021 FINAL 04/10/2021   REPTSTATUS 04/16/2021 FINAL 04/10/2021   CULT  04/10/2021    NO GROWTH 5 DAYS Performed at [facility], [place] [address], [place], [place] [address]    CULT  04/10/2021    NO GROWTH 5 DAYS Performed at [facility] [address] [place], [place] [address]      Lab Results  Component Value Date   ALBUMIN 2.2 (L) 04/14/2021   ALBUMIN 2.4 (L) 04/12/2021   ALBUMIN 2.2 (L) 04/10/2021    Lab Results  Component Value Date   MG 2.1 04/12/2021  MG 1.7 04/11/2021   No results found for: VD25OH  No results found for: PREALBUMIN CBC EXTENDED Latest Ref Rng & Units 04/19/2021 04/17/2021 04/15/2021  WBC 4.0 - 10.5 K/uL 8.9 8.2 7.0  RBC 4.22 - 5.81 MIL/uL 3.13(L) 3.04(L) 2.89(L)  HGB 13.0 - 17.0 g/dL 9.7(L) 9.1(L) 8.8(L)  HCT 39.0 - 52.0 % 30.6(L) 29.6(L) 28.0(L)  PLT 150 - 400 K/uL 128(L) 144(L) 127(L)     There is no height or weight on file to calculate BMI.  Orders:  No orders of the defined types were placed in this encounter.  Meds ordered this encounter  Medications   nitroGLYCERIN (NITRODUR - DOSED IN MG/24 HR) 0.2 mg/hr patch    Sig: Place 1 patch (0.2 mg total) onto the skin daily.    Dispense:  30 patch     Refill:  12   pentoxifylline (TRENTAL) 400 MG CR tablet    Sig: Take 1 tablet (400 mg total) by mouth 3 (three) times daily with meals.    Dispense:  90 tablet    Refill:  3     Procedures: No procedures performed  Clinical Data: No additional findings.  ROS:  All other systems negative, except as noted in the HPI. Review of Systems  Objective: Vital Signs: There were no vitals taken for this visit.  Specialty Comments:  No specialty comments available.  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[place]) 09/02/2015   Hyperlipidemia 09/02/2015   Essential hypertension, benign 09/02/2015   Obesity due to excess calories 09/02/2015   Past Medical History:  Diagnosis Date   Diabetes mellitus without complication (HCC)    History of bleeding ulcers    History of TIAs    Hypertension    Low kidney function     Family History  Problem Relation Age of Onset   Stroke Mother    Stroke Father    Cancer Father    Cancer Brother     Past Surgical History:  Procedure Laterality Date   ABDOMINAL AORTOGRAM W/LOWER EXTREMITY Bilateral 04/14/2021   Procedure: ABDOMINAL AORTOGRAM W/LOWER EXTREMITY;  Surgeon: Elam Dutch, MD;  Location: [facility];  Service: Vascular;  Laterality: Bilateral;   CATARACT EXTRACTION W/PHACO Left 02/15/2014   Procedure: CATARACT EXTRACTION PHACO AND INTRAOCULAR LENS PLACEMENT ([place]);  Surgeon: Tonny Branch, MD;  Location: [facility];  Service:  Ophthalmology;  Laterality: Left;  CDE 10.84   CATARACT EXTRACTION W/PHACO Right 12/14/2013   Procedure: CATARACT EXTRACTION PHACO AND INTRAOCULAR LENS PLACEMENT (I[facility]);  Surgeon: Tonny Branch, MD;  Location: [facility];  Service: Ophthalmology;  Laterality: Right;  CDE:  16.30   CHOLECYSTECTOMY  02/2013   EYE SURGERY     IR FLUORO GUIDE CV LINE RIGHT  01/05/2019   IR US GUIDE VASC ACCESS RIGHT  01/05/2019   LAPAROSCOPIC NEPHRECTOMY Right 01/08/2019   Procedure: LAPAROSCOPIC RADICAL NEPHRECTOMY;  Surgeon: Raynelle Bring, MD;  Location: [facility];  Service: Urology;  Laterality: Right;   LEFT HEART CATH AND CORS/GRAFTS ANGIOGRAPHY N/A 04/14/2021   Procedure: LEFT HEART CATH AND CORS/GRAFTS ANGIOGRAPHY;  Surgeon: Jolaine Artist, MD;  Location: [facility];  Service: Cardiovascular;  Laterality: N/A;   PENILE PROSTHESIS IMPLANT     PERIPHERAL VASCULAR INTERVENTION Left 04/19/2021   Procedure: PERIPHERAL VASCULAR INTERVENTION;  Surgeon: Cherre Robins, MD;  Location: [facility];  Service: Cardiovascular;  Laterality: Left;   TOE AMPUTATION Right 2013   [facility]   Social History   Occupational History   Not on file  Tobacco Use   Smoking status: Never   Smokeless tobacco: Never  Vaping Use   Vaping Use: Never used  Substance and Sexual Activity   Alcohol use: Yes    Comment: social drink    Drug use: No   Sexual activity: Not on file

## 2021-05-07 ENCOUNTER — Emergency Department (HOSPITAL_COMMUNITY): Payer: Managed Care, Other (non HMO)

## 2021-05-07 ENCOUNTER — Encounter (HOSPITAL_COMMUNITY): Payer: Self-pay | Admitting: Emergency Medicine

## 2021-05-07 ENCOUNTER — Inpatient Hospital Stay (HOSPITAL_COMMUNITY)
Admission: EM | Admit: 2021-05-07 | Discharge: 2021-05-18 | DRG: 579 | Disposition: A | Discharge status: Rehabilitation Facility | Payer: Managed Care, Other (non HMO) | Attending: Internal Medicine | Admitting: Internal Medicine

## 2021-05-07 ENCOUNTER — Other Ambulatory Visit: Payer: Self-pay

## 2021-05-07 DIAGNOSIS — G2581 Restless legs syndrome: Secondary | ICD-10-CM | POA: Diagnosis present

## 2021-05-07 DIAGNOSIS — J189 Pneumonia, unspecified organism: Secondary | ICD-10-CM

## 2021-05-07 DIAGNOSIS — I252 Old myocardial infarction: Secondary | ICD-10-CM

## 2021-05-07 DIAGNOSIS — R059 Cough, unspecified: Secondary | ICD-10-CM

## 2021-05-07 DIAGNOSIS — D631 Anemia in chronic kidney disease: Secondary | ICD-10-CM | POA: Diagnosis present

## 2021-05-07 DIAGNOSIS — Z6827 Body mass index (BMI) 27.0-27.9, adult: Secondary | ICD-10-CM

## 2021-05-07 DIAGNOSIS — D62 Acute posthemorrhagic anemia: Secondary | ICD-10-CM | POA: Diagnosis not present

## 2021-05-07 DIAGNOSIS — E1165 Type 2 diabetes mellitus with hyperglycemia: Secondary | ICD-10-CM | POA: Diagnosis present

## 2021-05-07 DIAGNOSIS — E11649 Type 2 diabetes mellitus with hypoglycemia without coma: Secondary | ICD-10-CM | POA: Diagnosis not present

## 2021-05-07 DIAGNOSIS — Z992 Dependence on renal dialysis: Secondary | ICD-10-CM

## 2021-05-07 DIAGNOSIS — R339 Retention of urine, unspecified: Secondary | ICD-10-CM | POA: Diagnosis present

## 2021-05-07 DIAGNOSIS — N186 End stage renal disease: Secondary | ICD-10-CM | POA: Diagnosis not present

## 2021-05-07 DIAGNOSIS — I10 Essential (primary) hypertension: Secondary | ICD-10-CM | POA: Diagnosis present

## 2021-05-07 DIAGNOSIS — Z20822 Contact with and (suspected) exposure to covid-19: Secondary | ICD-10-CM | POA: Diagnosis present

## 2021-05-07 DIAGNOSIS — E1152 Type 2 diabetes mellitus with diabetic peripheral angiopathy with gangrene: Secondary | ICD-10-CM | POA: Diagnosis present

## 2021-05-07 DIAGNOSIS — G4700 Insomnia, unspecified: Secondary | ICD-10-CM | POA: Diagnosis present

## 2021-05-07 DIAGNOSIS — L03115 Cellulitis of right lower limb: Secondary | ICD-10-CM

## 2021-05-07 DIAGNOSIS — I5023 Acute on chronic systolic (congestive) heart failure: Secondary | ICD-10-CM

## 2021-05-07 DIAGNOSIS — Z9842 Cataract extraction status, left eye: Secondary | ICD-10-CM

## 2021-05-07 DIAGNOSIS — E785 Hyperlipidemia, unspecified: Secondary | ICD-10-CM | POA: Diagnosis not present

## 2021-05-07 DIAGNOSIS — L03116 Cellulitis of left lower limb: Secondary | ICD-10-CM

## 2021-05-07 DIAGNOSIS — L0390 Cellulitis, unspecified: Secondary | ICD-10-CM | POA: Diagnosis present

## 2021-05-07 DIAGNOSIS — Z951 Presence of aortocoronary bypass graft: Secondary | ICD-10-CM

## 2021-05-07 DIAGNOSIS — Z823 Family history of stroke: Secondary | ICD-10-CM

## 2021-05-07 DIAGNOSIS — I70262 Atherosclerosis of native arteries of extremities with gangrene, left leg: Secondary | ICD-10-CM | POA: Diagnosis present

## 2021-05-07 DIAGNOSIS — I132 Hypertensive heart and chronic kidney disease with heart failure and with stage 5 chronic kidney disease, or end stage renal disease: Secondary | ICD-10-CM | POA: Diagnosis present

## 2021-05-07 DIAGNOSIS — Z794 Long term (current) use of insulin: Secondary | ICD-10-CM

## 2021-05-07 DIAGNOSIS — F419 Anxiety disorder, unspecified: Secondary | ICD-10-CM | POA: Diagnosis present

## 2021-05-07 DIAGNOSIS — L97429 Non-pressure chronic ulcer of left heel and midfoot with unspecified severity: Secondary | ICD-10-CM | POA: Diagnosis present

## 2021-05-07 DIAGNOSIS — I739 Peripheral vascular disease, unspecified: Secondary | ICD-10-CM | POA: Diagnosis present

## 2021-05-07 DIAGNOSIS — E1122 Type 2 diabetes mellitus with diabetic chronic kidney disease: Secondary | ICD-10-CM | POA: Diagnosis present

## 2021-05-07 DIAGNOSIS — E46 Unspecified protein-calorie malnutrition: Secondary | ICD-10-CM | POA: Diagnosis present

## 2021-05-07 DIAGNOSIS — Z961 Presence of intraocular lens: Secondary | ICD-10-CM | POA: Diagnosis present

## 2021-05-07 DIAGNOSIS — D539 Nutritional anemia, unspecified: Secondary | ICD-10-CM | POA: Diagnosis present

## 2021-05-07 DIAGNOSIS — Z9841 Cataract extraction status, right eye: Secondary | ICD-10-CM

## 2021-05-07 DIAGNOSIS — Z7982 Long term (current) use of aspirin: Secondary | ICD-10-CM

## 2021-05-07 DIAGNOSIS — E11621 Type 2 diabetes mellitus with foot ulcer: Secondary | ICD-10-CM | POA: Diagnosis present

## 2021-05-07 DIAGNOSIS — Z9049 Acquired absence of other specified parts of digestive tract: Secondary | ICD-10-CM

## 2021-05-07 DIAGNOSIS — I2510 Atherosclerotic heart disease of native coronary artery without angina pectoris: Secondary | ICD-10-CM | POA: Diagnosis present

## 2021-05-07 DIAGNOSIS — Z79899 Other long term (current) drug therapy: Secondary | ICD-10-CM

## 2021-05-07 DIAGNOSIS — Z7902 Long term (current) use of antithrombotics/antiplatelets: Secondary | ICD-10-CM

## 2021-05-07 DIAGNOSIS — K5909 Other constipation: Secondary | ICD-10-CM | POA: Diagnosis present

## 2021-05-07 DIAGNOSIS — L97422 Non-pressure chronic ulcer of left heel and midfoot with fat layer exposed: Secondary | ICD-10-CM

## 2021-05-07 DIAGNOSIS — G546 Phantom limb syndrome with pain: Secondary | ICD-10-CM | POA: Diagnosis not present

## 2021-05-07 DIAGNOSIS — M79672 Pain in left foot: Secondary | ICD-10-CM | POA: Diagnosis not present

## 2021-05-07 DIAGNOSIS — E8809 Other disorders of plasma-protein metabolism, not elsewhere classified: Secondary | ICD-10-CM | POA: Diagnosis present

## 2021-05-07 DIAGNOSIS — N2581 Secondary hyperparathyroidism of renal origin: Secondary | ICD-10-CM | POA: Diagnosis present

## 2021-05-07 DIAGNOSIS — Z8673 Personal history of transient ischemic attack (TIA), and cerebral infarction without residual deficits: Secondary | ICD-10-CM

## 2021-05-07 DIAGNOSIS — Z905 Acquired absence of kidney: Secondary | ICD-10-CM

## 2021-05-07 DIAGNOSIS — L97509 Non-pressure chronic ulcer of other part of unspecified foot with unspecified severity: Secondary | ICD-10-CM

## 2021-05-07 DIAGNOSIS — E11610 Type 2 diabetes mellitus with diabetic neuropathic arthropathy: Secondary | ICD-10-CM | POA: Diagnosis present

## 2021-05-07 HISTORY — DX: Anemia in other chronic diseases classified elsewhere: D63.8

## 2021-05-07 HISTORY — DX: Atherosclerotic heart disease of native coronary artery without angina pectoris: I25.10

## 2021-05-07 HISTORY — DX: Peripheral vascular disease, unspecified: I73.9

## 2021-05-07 HISTORY — DX: End stage renal disease: N18.6

## 2021-05-07 LAB — LACTIC ACID, PLASMA
Lactic Acid, Venous: 1.6 mmol/L (ref 0.5–1.9)
Lactic Acid, Venous: 1.4 mmol/L (ref 0.5–1.9)

## 2021-05-07 LAB — RESP PANEL BY RT-PCR (FLU A&B, COVID) ARPGX2
Influenza A by PCR: NEGATIVE
Influenza B by PCR: NEGATIVE
SARS Coronavirus 2 by RT PCR: NEGATIVE

## 2021-05-07 LAB — CBC WITH DIFFERENTIAL/PLATELET
Abs Immature Granulocytes: 0.07 10*3/uL (ref 0.00–0.07)
Basophils Absolute: 0 10*3/uL (ref 0.0–0.1)
Basophils Relative: 0 %
Eosinophils Absolute: 0 10*3/uL (ref 0.0–0.5)
Eosinophils Relative: 0 %
HCT: 27.3 % — ABNORMAL LOW (ref 39.0–52.0)
Hemoglobin: 8.5 g/dL — ABNORMAL LOW (ref 13.0–17.0)
Immature Granulocytes: 1 %
Lymphocytes Relative: 9 %
Lymphs Abs: 1.3 10*3/uL (ref 0.7–4.0)
MCH: 31.5 pg (ref 26.0–34.0)
MCHC: 31.1 g/dL (ref 30.0–36.0)
MCV: 101.1 fL — ABNORMAL HIGH (ref 80.0–100.0)
Monocytes Absolute: 0.9 10*3/uL (ref 0.1–1.0)
Monocytes Relative: 7 %
Neutro Abs: 11.1 10*3/uL — ABNORMAL HIGH (ref 1.7–7.7)
Neutrophils Relative %: 83 %
Platelets: 174 10*3/uL (ref 150–400)
RBC: 2.7 MIL/uL — ABNORMAL LOW (ref 4.22–5.81)
RDW: 15.9 % — ABNORMAL HIGH (ref 11.5–15.5)
WBC: 13.4 10*3/uL — ABNORMAL HIGH (ref 4.0–10.5)
nRBC: 0 % (ref 0.0–0.2)

## 2021-05-07 LAB — COMPREHENSIVE METABOLIC PANEL
ALT: 24 U/L (ref 0–44)
AST: 29 U/L (ref 15–41)
Albumin: 2.5 g/dL — ABNORMAL LOW (ref 3.5–5.0)
Alkaline Phosphatase: 121 U/L (ref 38–126)
Anion gap: 14 (ref 5–15)
BUN: 57 mg/dL — ABNORMAL HIGH (ref 8–23)
CO2: 28 mmol/L (ref 22–32)
Calcium: 8.9 mg/dL (ref 8.9–10.3)
Chloride: 92 mmol/L — ABNORMAL LOW (ref 98–111)
Creatinine, Ser: 6.26 mg/dL — ABNORMAL HIGH (ref 0.61–1.24)
GFR, Estimated: 9 mL/min — ABNORMAL LOW (ref >60)
Glucose, Bld: 185 mg/dL — ABNORMAL HIGH (ref 70–99)
Potassium: 4.1 mmol/L (ref 3.5–5.1)
Sodium: 134 mmol/L — ABNORMAL LOW (ref 135–145)
Total Bilirubin: 1.3 mg/dL — ABNORMAL HIGH (ref 0.3–1.2)
Total Protein: 6.8 g/dL (ref 6.5–8.1)

## 2021-05-07 LAB — TROPONIN I (HIGH SENSITIVITY): Troponin I (High Sensitivity): 196 ng/L (ref <18)

## 2021-05-07 LAB — PROTIME-INR
INR: 1.3 — ABNORMAL HIGH (ref 0.8–1.2)
Prothrombin Time: 15.7 seconds — ABNORMAL HIGH (ref 11.4–15.2)

## 2021-05-07 LAB — APTT: aPTT: 37 seconds — ABNORMAL HIGH (ref 24–36)

## 2021-05-07 MED ORDER — SODIUM CHLORIDE 0.9 % IV SOLN
2.0000 g | INTRAVENOUS | Status: DC
  Administered 2021-05-08 – 2021-05-12 (×6): 2 g via INTRAVENOUS
  Filled 2021-05-07 (×6): qty 20

## 2021-05-07 MED ORDER — TAMSULOSIN HCL 0.4 MG PO CAPS
0.4000 mg | ORAL_CAPSULE | Freq: Every day | ORAL | Status: DC
  Administered 2021-05-08 – 2021-05-18 (×12): 0.4 mg via ORAL
  Filled 2021-05-07 (×12): qty 1

## 2021-05-07 MED ORDER — CALCITRIOL 0.25 MCG PO CAPS
0.2500 ug | ORAL_CAPSULE | Freq: Two times a day (BID) | ORAL | Status: DC
  Administered 2021-05-08 – 2021-05-18 (×20): 0.25 ug via ORAL
  Filled 2021-05-07 (×20): qty 1

## 2021-05-07 MED ORDER — INSULIN ASPART 100 UNIT/ML IJ SOLN
0.0000 [IU] | Freq: Three times a day (TID) | INTRAMUSCULAR | Status: DC
  Administered 2021-05-08 – 2021-05-09 (×3): 1 [IU] via SUBCUTANEOUS
  Administered 2021-05-11: 2 [IU] via SUBCUTANEOUS
  Administered 2021-05-11 – 2021-05-12 (×3): 1 [IU] via SUBCUTANEOUS
  Administered 2021-05-13: 2 [IU] via SUBCUTANEOUS
  Administered 2021-05-13: 1 [IU] via SUBCUTANEOUS
  Administered 2021-05-13: 2 [IU] via SUBCUTANEOUS
  Administered 2021-05-14: 1 [IU] via SUBCUTANEOUS
  Administered 2021-05-14 (×2): 2 [IU] via SUBCUTANEOUS
  Administered 2021-05-15: 3 [IU] via SUBCUTANEOUS
  Administered 2021-05-16 – 2021-05-18 (×5): 1 [IU] via SUBCUTANEOUS

## 2021-05-07 MED ORDER — CLONAZEPAM 0.5 MG PO TABS
0.5000 mg | ORAL_TABLET | Freq: Two times a day (BID) | ORAL | Status: DC | PRN
  Administered 2021-05-08 – 2021-05-18 (×2): 0.5 mg via ORAL
  Filled 2021-05-07 (×2): qty 1

## 2021-05-07 MED ORDER — HYDROCODONE-ACETAMINOPHEN 5-325 MG PO TABS
1.0000 | ORAL_TABLET | ORAL | Status: DC | PRN
  Administered 2021-05-07 – 2021-05-10 (×9): 1 via ORAL
  Filled 2021-05-07 (×9): qty 1

## 2021-05-07 MED ORDER — ESCITALOPRAM OXALATE 10 MG PO TABS
10.0000 mg | ORAL_TABLET | Freq: Every day | ORAL | Status: DC
  Administered 2021-05-08 – 2021-05-18 (×10): 10 mg via ORAL
  Filled 2021-05-07 (×10): qty 1

## 2021-05-07 MED ORDER — ACETAMINOPHEN 325 MG PO TABS
650.0000 mg | ORAL_TABLET | Freq: Four times a day (QID) | ORAL | Status: DC | PRN
  Administered 2021-05-08 – 2021-05-18 (×4): 650 mg via ORAL
  Filled 2021-05-07 (×4): qty 2

## 2021-05-07 MED ORDER — PENTOXIFYLLINE ER 400 MG PO TBCR
400.0000 mg | EXTENDED_RELEASE_TABLET | Freq: Three times a day (TID) | ORAL | Status: DC
Start: 2021-05-08 — End: 2021-05-18
  Administered 2021-05-08 – 2021-05-18 (×30): 400 mg via ORAL
  Filled 2021-05-07 (×34): qty 1

## 2021-05-07 MED ORDER — SODIUM CHLORIDE 0.9% FLUSH
3.0000 mL | Freq: Two times a day (BID) | INTRAVENOUS | Status: DC
  Administered 2021-05-08 – 2021-05-18 (×16): 3 mL via INTRAVENOUS

## 2021-05-07 MED ORDER — ASPIRIN EC 81 MG PO TBEC
81.0000 mg | DELAYED_RELEASE_TABLET | Freq: Every day | ORAL | Status: DC
  Administered 2021-05-08 – 2021-05-18 (×10): 81 mg via ORAL
  Filled 2021-05-07 (×10): qty 1

## 2021-05-07 MED ORDER — ATORVASTATIN CALCIUM 80 MG PO TABS
80.0000 mg | ORAL_TABLET | Freq: Every day | ORAL | Status: DC
  Administered 2021-05-08 – 2021-05-18 (×10): 80 mg via ORAL
  Filled 2021-05-07 (×10): qty 1

## 2021-05-07 MED ORDER — HEPARIN SODIUM (PORCINE) 5000 UNIT/ML IJ SOLN
5000.0000 [IU] | Freq: Three times a day (TID) | INTRAMUSCULAR | Status: DC
  Administered 2021-05-08 – 2021-05-11 (×13): 5000 [IU] via SUBCUTANEOUS
  Filled 2021-05-07 (×13): qty 1

## 2021-05-07 MED ORDER — METRONIDAZOLE 500 MG/100ML IV SOLN
500.0000 mg | Freq: Three times a day (TID) | INTRAVENOUS | Status: DC
  Administered 2021-05-08 – 2021-05-13 (×15): 500 mg via INTRAVENOUS
  Filled 2021-05-07 (×16): qty 100

## 2021-05-07 MED ORDER — VANCOMYCIN HCL 1750 MG/350ML IV SOLN
1750.0000 mg | Freq: Once | INTRAVENOUS | Status: AC
  Administered 2021-05-07: 1750 mg via INTRAVENOUS
  Filled 2021-05-07: qty 350

## 2021-05-07 MED ORDER — FERRIC CITRATE 1 GM 210 MG(FE) PO TABS
630.0000 mg | ORAL_TABLET | Freq: Three times a day (TID) | ORAL | Status: DC
  Administered 2021-05-08 – 2021-05-16 (×25): 630 mg via ORAL
  Filled 2021-05-07 (×26): qty 3

## 2021-05-07 MED ORDER — VANCOMYCIN VARIABLE DOSE PER UNSTABLE RENAL FUNCTION (PHARMACIST DOSING): Status: DC

## 2021-05-07 MED ORDER — CLOPIDOGREL BISULFATE 75 MG PO TABS
75.0000 mg | ORAL_TABLET | Freq: Every day | ORAL | Status: DC
  Administered 2021-05-08 – 2021-05-18 (×10): 75 mg via ORAL
  Filled 2021-05-07 (×10): qty 1

## 2021-05-07 MED ORDER — ACETAMINOPHEN 650 MG RE SUPP: 650.0000 mg | Freq: Four times a day (QID) | RECTAL | Status: DC | PRN

## 2021-05-07 MED ORDER — POLYETHYLENE GLYCOL 3350 17 G PO PACK
17.0000 g | PACK | Freq: Every day | ORAL | Status: DC | PRN
Start: 2021-05-07 — End: 2021-05-10

## 2021-05-07 NOTE — ED Triage Notes (Addendum)
Pt reports wound to L heel for a few months that he has seen Dr. Sharol Given for.  Reports increased pain and drainage x 2 days.  Denies fever and chills.  Last dialysis on Friday.

## 2021-05-07 NOTE — Progress Notes (Signed)
Pharmacy Antibiotic Note  Maxwell Harmon is a 65 y.o. male admitted on 05/07/2021 with  chronic ischemic ulcer of left foot .  Pharmacy has been consulted for vancomycin dosing.  Patient is s/p revascularization of the leg is followed by Ortho.  Endorses increasing pain, discharge, erythema, edema over past three days.  Patient is ESRD (MWF) last session 7/1.  Plan: Vancomycin 1750 mg x 1 dose, subsequent dosing to be determined by HD schedule Pharmacy to enter subsequent doses as needed F/u cultures     Temp (24hrs), Avg:98.1 F (36.7 C), Min:97.8 F (36.6 C), Max:98.4 F (36.9 C)  Recent Labs  Lab 05/07/21 1709  WBC 13.4*  CREATININE 6.26*  LATICACIDVEN 1.6    CrCl cannot be calculated (Unknown ideal weight.).    No Known Allergies  Antimicrobials this admission: Vancomycin 7/3 >>  Microbiology results: Pending  Thank you for allowing pharmacy to be a part of this patient's care.  Lorelei Pont, PharmD, BCPS 05/07/2021 8:37 PM ED Clinical Pharmacist -  [phone]  05/07/2021 7:57 PM

## 2021-05-07 NOTE — ED Notes (Signed)
Called report to floor  Requested Trop result before patient come up

## 2021-05-07 NOTE — ED Provider Notes (Signed)
Emergency Department Provider Note   I have reviewed the triage vital signs and the nursing notes.   HISTORY  Chief Complaint Foot Pain   HPI Maxwell Harmon is a 65 y.o. male with past medical history of end-stage renal disease and ischemic ulcer to the left heel status post revascularization presents to the emergency department with worsening pain, foot redness/swelling, and wound drainage.  Patient had revascularization of the left lower extremity on 6/15 and followed with Dr. Sharol Given on 6/28 in the office.  He has been very compliant with the treatment plan since being home but the patient's wife has noticed increased drainage with changing his dressings.  He has developed severe pain in the foot with redness and warmth which prompted ED evaluation. No pain into the calf or thigh. No new ulcers.   Past Medical History:  Diagnosis Date   Diabetes mellitus without complication ([place])    History of bleeding ulcers    History of TIAs    Hypertension    Low kidney function     Patient Active Problem List   Diagnosis Date Noted   Cellulitis 05/07/2021   Ischemic ulcer diabetic foot ([place]) 05/07/2021   Critical lower limb ischemia ([place]) 04/20/2021   Sepsis ([place]) 04/20/2021   NSTEMI (non-ST elevated myocardial infarction) ([place]) 04/20/2021   Pressure injury of skin 04/11/2021   Acute heart failure ([place]) 04/10/2021   Uremia 04/10/2021   Gangrene of left foot ([place])    PAD (peripheral artery disease) ([place])    ESRD on hemodialysis ([place])    Dyspnea on exertion    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[place]) 09/02/2015   Hyperlipidemia 09/02/2015   Essential hypertension, benign 09/02/2015   Obesity due to excess calories 09/02/2015    Past Surgical History:  Procedure Laterality Date   ABDOMINAL AORTOGRAM W/LOWER EXTREMITY Bilateral 04/14/2021   Procedure: ABDOMINAL AORTOGRAM W/LOWER EXTREMITY;  Surgeon:  Elam Dutch, MD;  Location: [facility];  Service: Vascular;  Laterality: Bilateral;   CATARACT EXTRACTION W/PHACO Left 02/15/2014   Procedure: CATARACT EXTRACTION PHACO AND INTRAOCULAR LENS PLACEMENT ([place]);  Surgeon: Tonny Branch, MD;  Location: [facility];  Service: Ophthalmology;  Laterality: Left;  CDE 10.84   CATARACT EXTRACTION W/PHACO Right 12/14/2013   Procedure: CATARACT EXTRACTION PHACO AND INTRAOCULAR LENS PLACEMENT (I[facility]);  Surgeon: Tonny Branch, MD;  Location: [facility];  Service: Ophthalmology;  Laterality: Right;  CDE:  16.30   CHOLECYSTECTOMY  02/2013   EYE SURGERY     IR FLUORO GUIDE CV LINE RIGHT  01/05/2019   IR US GUIDE VASC ACCESS RIGHT  01/05/2019   LAPAROSCOPIC NEPHRECTOMY Right 01/08/2019   Procedure: LAPAROSCOPIC RADICAL NEPHRECTOMY;  Surgeon: Raynelle Bring, MD;  [facility];  Service: Urology;  Laterality: Right;   LEFT HEART CATH AND CORS/GRAFTS ANGIOGRAPHY N/A 04/14/2021   Procedure: LEFT HEART CATH AND CORS/GRAFTS ANGIOGRAPHY;  Surgeon: Jolaine Artist, MD;  Location: [facility];  Service: Cardiovascular;  Laterality: N/A;   PENILE PROSTHESIS IMPLANT     PERIPHERAL VASCULAR INTERVENTION Left 04/19/2021   Procedure: PERIPHERAL VASCULAR INTERVENTION;  Surgeon: Cherre Robins, MD;  Location: [facility];  Service: Cardiovascular;  Laterality: Left;   TOE AMPUTATION Right 2013   [facility]    Allergies Patient has no known allergies.  Family History  Problem Relation Age of Onset   Stroke Mother  Stroke Father    Cancer Father    Cancer Brother     Social History Social History   Tobacco Use   Smoking status: Never   Smokeless tobacco: Never  Vaping Use   Vaping Use: Never used  Substance Use Topics   Alcohol use: Yes    Comment: social drink    Drug use: No    Review of Systems  Constitutional: No fever/chills Cardiovascular: Denies chest pain. Respiratory: Denies shortness of breath. Gastrointestinal: No  abdominal pain.   Genitourinary: Negative for dysuria. Musculoskeletal: Negative for back pain. Skin: worsening left heel ulceration.  Neurological: Negative for headaches, focal weakness or numbness.  10-point ROS otherwise negative.  ____________________________________________   PHYSICAL EXAM:  VITAL SIGNS: ED Triage Vitals  Enc Vitals Group     BP 05/07/21 1659 118/74     Pulse Rate 05/07/21 1659 100     Resp 05/07/21 1659 18     Temp 05/07/21 1659 98.4 F (36.9 C)     Temp Source 05/07/21 1659 Oral     SpO2 05/07/21 1659 100 %   Constitutional: Alert and oriented. Well appearing and in no acute distress. Eyes: Conjunctivae are normal.  Head: Atraumatic. Nose: No congestion/rhinnorhea. Mouth/Throat: Mucous membranes are moist.   Neck: No stridor.   Cardiovascular: Normal rate, regular rhythm. Good peripheral circulation in the left foot. Grossly normal heart sounds.   Respiratory: Normal respiratory effort.  No retractions. Lungs CTAB. Gastrointestinal: Soft and nontender. No distention.  Musculoskeletal: No lower extremity tenderness nor edema. No gross deformities of extremities. Neurologic:  Normal speech and language. No gross focal neurologic deficits are appreciated.  Skin: Multiple areas of ischemic ulceration of the left foot worse in the left heel as pictured below.  There is some ischemic appearing tissue there with wound edges seeming somewhat dusky.  No frank pus or bleeding.      ____________________________________________   LABS (all labs ordered are listed, but only abnormal results are displayed)  Labs Reviewed  COMPREHENSIVE METABOLIC PANEL - Abnormal; Notable for the following components:      Result Value   Sodium 134 (*)    Chloride 92 (*)    Glucose, Bld 185 (*)    BUN 57 (*)    Creatinine, Ser 6.26 (*)    Albumin 2.5 (*)    Total Bilirubin 1.3 (*)    GFR, Estimated 9 (*)    All other components within normal limits  CBC WITH  DIFFERENTIAL/PLATELET - Abnormal; Notable for the following components:   WBC 13.4 (*)    RBC 2.70 (*)    Hemoglobin 8.5 (*)    HCT 27.3 (*)    MCV 101.1 (*)    RDW 15.9 (*)    Neutro Abs 11.1 (*)    All other components within normal limits  PROTIME-INR - Abnormal; Notable for the following components:   Prothrombin Time 15.7 (*)    INR 1.3 (*)    All other components within normal limits  APTT - Abnormal; Notable for the following components:   aPTT 37 (*)    All other components within normal limits  COMPREHENSIVE METABOLIC PANEL - Abnormal; Notable for the following components:   Sodium 134 (*)    Chloride 93 (*)    Glucose, Bld 221 (*)    BUN 61 (*)    Creatinine, Ser 6.75 (*)    Calcium 8.3 (*)    Total Protein 5.8 (*)    Albumin 2.1 (*)  GFR, Estimated 8 (*)    All other components within normal limits  CBC - Abnormal; Notable for the following components:   WBC 11.7 (*)    RBC 2.50 (*)    Hemoglobin 7.8 (*)    HCT 25.0 (*)    RDW 15.8 (*)    Platelets 149 (*)    All other components within normal limits  CBC - Abnormal; Notable for the following components:   WBC 11.3 (*)    RBC 2.65 (*)    Hemoglobin 8.2 (*)    HCT 26.4 (*)    RDW 16.1 (*)    Platelets 149 (*)    All other components within normal limits  GLUCOSE, CAPILLARY - Abnormal; Notable for the following components:   Glucose-Capillary 172 (*)    All other components within normal limits  GLUCOSE, CAPILLARY - Abnormal; Notable for the following components:   Glucose-Capillary 100 (*)    All other components within normal limits  GLUCOSE, CAPILLARY - Abnormal; Notable for the following components:   Glucose-Capillary 120 (*)    All other components within normal limits  GLUCOSE, CAPILLARY - Abnormal; Notable for the following components:   Glucose-Capillary 100 (*)    All other components within normal limits  TROPONIN I (HIGH SENSITIVITY) - Abnormal; Notable for the following components:    Troponin I (High Sensitivity) 196 (*)    All other components within normal limits  TROPONIN I (HIGH SENSITIVITY) - Abnormal; Notable for the following components:   Troponin I (High Sensitivity) 211 (*)    All other components within normal limits  CULTURE, BLOOD (ROUTINE X 2)  CULTURE, BLOOD (ROUTINE X 2)  RESP PANEL BY RT-PCR (FLU A&B, COVID) ARPGX2  URINE CULTURE  LACTIC ACID, PLASMA  LACTIC ACID, PLASMA  GLUCOSE, CAPILLARY  URINALYSIS, ROUTINE W REFLEX MICROSCOPIC   ____________________________________________  EKG   EKG Interpretation  Date/Time:  Sunday May 07 2021 22:54:51 EDT Ventricular Rate:  104 PR Interval:  162 QRS Duration: 92 QT Interval:  336 QTC Calculation: 441 R Axis:   111 Text Interpretation: Sinus tachycardia Right axis deviation ST & T wave abnormality, consider inferolateral ischemia Abnormal ECG No significant change since last tracing Confirmed by Theotis Burrow [phone]) on 05/08/2021 9:45:20 PM         ____________________________________________  RADIOLOGY  VAS [place] UPPER EXTREMITY ARTERIAL DUPLEX  Result Date: 05/08/2021  UPPER EXTREMITY DUPLEX STUDY Patient Name:  NINO AMANO  Date of Exam:   05/08/2021 Medical Rec #: [id number]        Accession #:    [id number] Date of Birth: 12/04/1955        Patient Gender: M Patient Age:   72Y Exam Location:  [facility] Procedure:      VAS [place] UPPER EXTREMITY ARTERIAL DUPLEX Referring Phys: [id number] [place] --------------------------------------------------------------------------------  Indications: Intermittent pallor, poor capillary refill, and weak radial pulse              in the left upper extremity. History:     Patient has a history of Left upper extremity AV fistula (basilic).  Risk Factors:  Hypertension, hyperlipidemia, Diabetes, coronary artery disease. Other Factors: PAD with history of left LE ischemia, ESRD Comparison Study: No prior study on file Performing Technologist: [name] RDMS,RVT  Examination Guidelines: A complete evaluation includes B-mode imaging, spectral Doppler, color Doppler, and power Doppler as needed of all accessible portions of each vessel. Bilateral testing is considered an integral part of  a complete examination. Limited examinations for reoccurring indications may be performed as noted.   Summary:  Left: 50-74% stenosis noted at the anastomosis. Radial artery       appears patent with reversal of flow. *See table(s) above for measurements and observations. Electronically signed by Harold Barban MD on 05/08/2021 at 2:28:39 PM.    Final     ____________________________________________   PROCEDURES  Procedure(s) performed:   Procedures  None ____________________________________________   INITIAL IMPRESSION / ASSESSMENT AND PLAN / ED COURSE  Pertinent labs & imaging results that were available during my care of the patient were reviewed by me and considered in my medical decision making (see chart for details).   Patient with history of ischemic ulceration of the left foot followed by both vascular surgery and Dr. Sharol Given here.  He is having worsening pain.  The foot appears somewhat cellulitic.  He has a leukocytosis on arrival with normal lactate.  Plain film of the foot does not show any obvious bony abnormality.  I am concerned this may be getting secondarily infected.  The perfusion to the left foot appears intact after angioplasty on 6/15.  We will start antibiotics, obtain blood cultures, and keep the patient for wound care/debridement PRN. Patient does have ESRD with last HD on Friday. Potassium 4.1 today. No indication for emergent HD.   Discussed patient's case with [facility] to request admission. Patient and family (if present) updated with plan. Care transferred to [facility] service.  I reviewed all nursing notes, vitals, pertinent old records, EKGs, labs, imaging (as available).  ____________________________________________  FINAL CLINICAL  IMPRESSION(S) / ED DIAGNOSES  Final diagnoses:  Cellulitis of left lower extremity  Ischemic heel ulcer, left, with unspecified severity ([place])     MEDICATIONS GIVEN DURING THIS VISIT:  Medications  aspirin EC tablet 81 mg (81 mg Oral Given 05/08/21 0843)  atorvastatin (LIPITOR) tablet 80 mg (80 mg Oral Given 05/08/21 0845)  escitalopram (LEXAPRO) tablet 10 mg (10 mg Oral Given 05/08/21 0844)  calcitRIOL (ROCALTROL) capsule 0.25 mcg (0.25 mcg Oral Given 05/08/21 2131)  ferric citrate (AURYXIA) tablet 630 mg (630 mg Oral Given 05/08/21 2130)  tamsulosin (FLOMAX) capsule 0.4 mg (0.4 mg Oral Given 05/08/21 2130)  clopidogrel (PLAVIX) tablet 75 mg (75 mg Oral Given 05/08/21 0843)  pentoxifylline (TRENTAL) CR tablet 400 mg (400 mg Oral Given 05/08/21 1847)  clonazePAM (KLONOPIN) tablet 0.5 mg (0.5 mg Oral Given 05/08/21 2130)  heparin injection 5,000 Units (5,000 Units Subcutaneous Given 05/09/21 0521)  sodium chloride flush (NS) 0.9 % injection 3 mL (3 mLs Intravenous Given 05/08/21 2131)  acetaminophen (TYLENOL) tablet 650 mg (650 mg Oral Given 05/08/21 0039)    Or  acetaminophen (TYLENOL) suppository 650 mg ( Rectal See Alternative 05/08/21 0039)  polyethylene glycol (MIRALAX / GLYCOLAX) packet 17 g (has no administration in time range)  HYDROcodone-acetaminophen (NORCO/VICODIN) 5-325 MG per tablet 1 tablet (1 tablet Oral Given 05/09/21 0527)  cefTRIAXone (ROCEPHIN) 2 g in sodium chloride 0.9 % 100 mL IVPB (2 g Intravenous New Bag/Given 05/09/21 0019)  metroNIDAZOLE (FLAGYL) IVPB 500 mg (500 mg Intravenous New Bag/Given 05/09/21 0521)  insulin aspart (novoLOG) injection 0-6 Units (0 Units Subcutaneous Not Given 05/08/21 1848)  Chlorhexidine Gluconate Cloth 2 % PADS 6 each (6 each Topical Given 05/09/21 0522)  Darbepoetin Alfa ([name]) injection 150 mcg (has no administration in time range)  vancomycin (VANCOCIN) IVPB 750 mg/150 ml premix (750 mg Intravenous New Bag/Given 05/08/21 1645)  ondansetron (ZOFRAN) injection 4 mg  (4  mg Intravenous Given 05/09/21 0520)  vancomycin (VANCOREADY) IVPB 1750 mg/350 mL (1,750 mg Intravenous New Bag/Given 05/07/21 2221)  polyethylene glycol (MIRALAX / GLYCOLAX) packet 17 g (17 g Oral Given 05/08/21 1148)  vancomycin (VANCOREADY) 750 LO/756EP IVPB (  Duplicate 01/04/94 1884)    Note:  This document was prepared using Dragon voice recognition software and may include unintentional dictation errors.  Nanda Quinton, MD, [facility] Emergency Medicine    Izayiah Tibbitts, [facility], MD 05/09/21 [phone]

## 2021-05-07 NOTE — ED Provider Notes (Signed)
Emergency Medicine Provider Triage Evaluation Note  Maxwell Harmon , a 65 y.o. male  was evaluated in triage.  Pt complains of with history of type 2 diabetes and chronic poorly healing wound to the left heel who presents with several days of progressively worsening pain, increased discharge, and worsening redness and swelling of the foot distal to the wound.  Does follow with Dr. Sharol Given for care.  On hemodialysis most recent session 48 hours ago on Friday, 05/05/2021.  Denies fevers or chills, nausea, vomiting.  Relative is Dr. Ninfa Linden, general surgeon.  Review of Systems  Positive: Poorly healing diabetic wound to left heel increased drainage and pain Negative: Fevers, chills, nausea, vomiting, chest pain, shortness of breath.  Physical Exam  BP 118/74 (BP Location: Right Arm)   Pulse 100   Temp 98.4 F (36.9 C) (Oral)   Resp 18   SpO2 100%  Gen:   Awake, no distress   Resp:  Normal effort  MSK:   Moves extremities without difficulty  Other:  Left foot and lower leg in boot, erythema and edema of the distal left foot with normal cap refills there is tenderness palpation.  2+ pedal pulse.  Nitroglycerin patch present on top of the left foot.  Of note there is diminished radial pulse in the left hand, left hand is pale compared to the right.  Medical Decision Making  Medically screening exam initiated at 5:25 PM.  Appropriate orders placed.  [name] was informed that the remainder of the evaluation will be completed by another provider, this initial triage assessment does not replace that evaluation, and the importance of remaining in the ED until their evaluation is complete.  Will obtain arterial Doppler for diminished distal pulses in the left upper extremity.  Also will proceed with sepsis work-up.  Vital signs are normal, code sepsis not activated.  This chart was dictated using voice recognition software, Dragon. Despite the best efforts of this provider to proofread and correct  errors, errors may still occur which can change documentation meaning.      Aura Dials 05/07/21 1734    Arnaldo Natal, MD 05/07/21 2052

## 2021-05-07 NOTE — H&P (Signed)
History and Physical   Maxwell Harmon GOV:[id number] DOB: 07/29/1956 DOA: 05/07/2021  PCP: [name], DO   Patient coming from: Home  Chief Complaint: Worsening pain, discharge, erythema, edema and left foot  HPI: Maxwell Harmon is a 65 y.o. male with medical history significant of CHF, ESRD, hypertension, CAD, PAD history of limb ischemia, hyperlipidemia, diabetes, TIA, anxiety who presents with worsening skin changes and pain of left foot. 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  He had revascularization of the leg in 6/15 and has been following up with orthopedics initially improving.  He has noticed that for the past 3 days increasing pain, discharge, erythema, edema.  He has been following up with Dr. Due to in orthopedics office and was placed on compression stocking last week.  He had been using this until became too painful to use with increasing pain as above. He denies fevers, chills, chest pain, shortness of breath, abdominal pain, constipation, diarrhea, nausea, vomiting.  Of note, patient's son-in-law is Dr. Nedra Hai the general surgeon (brother of Dr. Zollie Beckers the orthopedic surgeon.)  ED Course: Vital signs in the ED were stable.  Lab work-up showed CMP with sodium of 134, chloride 92, BUN 57 and creatinine 6.26 consistent with ESRD.  Glucose 185.  Albumin 2.5 and T bili 1.3.  CBC showed leukocytosis to 13.4 and mild worsening of chronic anemia with a hemoglobin of 8.5 down from a baseline of 9 and an MCV of 101.  PT elevated to 15.7, PTT 37, INR 1.3.  Lactic acid normal with repeat pending.  Respiratory panel flu COVID pending urinalysis, urine culture, blood cultures pending.  Left foot x-ray was performed which showed no evidence of osteomyelitis and no acute abnormality.  Patient received dose of vancomycin in the ED.  Review of Systems: As per HPI otherwise all other systems reviewed and are negative.  Past  Medical History:  Diagnosis Date   Diabetes mellitus without complication ([place])    History of bleeding ulcers    History of TIAs    Hypertension    Low kidney function     Past Surgical History:  Procedure Laterality Date   ABDOMINAL AORTOGRAM W/LOWER EXTREMITY Bilateral 04/14/2021   Procedure: ABDOMINAL AORTOGRAM W/LOWER EXTREMITY;  Surgeon: Elam Dutch, MD;  Location: [facility];  Service: Vascular;  Laterality: Bilateral;   CATARACT EXTRACTION W/PHACO Left 02/15/2014   Procedure: CATARACT EXTRACTION PHACO AND INTRAOCULAR LENS PLACEMENT ([place]);  Surgeon: Tonny Branch, MD;  Location: [facility];  Service: Ophthalmology;  Laterality: Left;  CDE 10.84   CATARACT EXTRACTION W/PHACO Right 12/14/2013   Procedure: CATARACT EXTRACTION PHACO AND INTRAOCULAR LENS PLACEMENT ([facility]);  Surgeon: Tonny Branch, MD;  Location: [facility];  Service: Ophthalmology;  Laterality: Right;  CDE:  16.30   CHOLECYSTECTOMY  02/2013   EYE SURGERY     IR FLUORO GUIDE CV LINE RIGHT  01/05/2019   IR US GUIDE VASC ACCESS RIGHT  01/05/2019   LAPAROSCOPIC NEPHRECTOMY Right 01/08/2019   Procedure: LAPAROSCOPIC RADICAL NEPHRECTOMY;  Surgeon: Raynelle Bring, MD;  Location: [facility];  Service: Urology;  Laterality: Right;   LEFT HEART CATH AND CORS/GRAFTS ANGIOGRAPHY N/A 04/14/2021   Procedure: LEFT HEART CATH AND CORS/GRAFTS ANGIOGRAPHY;  Surgeon: Jolaine Artist, MD;  Location: [facility];  Service: Cardiovascular;  Laterality: N/A;   PENILE PROSTHESIS IMPLANT     PERIPHERAL VASCULAR INTERVENTION Left 04/19/2021  Procedure: PERIPHERAL VASCULAR INTERVENTION;  Surgeon: Cherre Robins, MD;  Location: [facility];  Service: Cardiovascular;  Laterality: Left;   TOE AMPUTATION Right 2013   [facility]    Social History  reports that he has never smoked. He has never used smokeless tobacco. He reports current alcohol use. He reports that he does not use drugs.  No Known Allergies  Family History   Problem Relation Age of Onset   Stroke Mother    Stroke Father    Cancer Father    Cancer Brother   Reviewed on admission  Prior to Admission medications   Medication Sig Start Date End Date Taking? Authorizing Provider  aspirin 81 MG EC tablet Take 1 tablet (81 mg total) by mouth daily. 04/20/21 07/19/21  Dessa Phi, DO  atorvastatin (LIPITOR) 40 MG tablet Take 2 tablets (80 mg total) by mouth daily. 04/20/21 07/19/21  Dessa Phi, DO  AURYXIA 1 GM 210 MG(Fe) tablet Take 630 mg by mouth 3 (three) times daily. 03/21/21   [provider]  blood glucose meter kit and supplies KIT Dispense based on patient and insurance preference. Use up to four times daily as directed. (FOR ICD-9 250.00, 250.01). 11/08/15   Cassandria Anger, MD  Blood Glucose Monitoring Suppl (BAYER CONTOUR MONITOR) w/Device KIT 1 each by Does not apply route 4 (four) times daily. Test 4 x daily 11/08/15   Cassandria Anger, MD  calcitRIOL (ROCALTROL) 0.25 MCG capsule Take 0.25 mcg by mouth in the morning and at bedtime. 02/06/21   [provider]  cinacalcet (SENSIPAR) 30 MG tablet Take 30 mg by mouth once a week. Take on Mondays 03/28/21   [provider]  clonazePAM (KLONOPIN) 0.5 MG tablet Take 0.5 mg by mouth daily as needed for anxiety.    [provider]  clopidogrel (PLAVIX) 75 MG tablet Take 1 tablet by mouth daily. 02/06/21   [provider]  collagenase (SANTYL) ointment Apply topically daily. 04/21/21   Dessa Phi, DO  escitalopram (LEXAPRO) 10 MG tablet Take 1 tablet (10 mg total) by mouth daily. 04/21/21 07/20/21  Dessa Phi, DO  fluorouracil (EFUDEX) 5 % cream Apply topically 2 (two) times daily. 03/06/21   [provider]  glucose blood (BAYER CONTOUR TEST) test strip Use as instructed 4 x daily 11/08/15   Cassandria Anger, MD  Insulin Glargine (BASAGLAR KWIKPEN) 100 UNIT/ML Inject 5 Units into the skin at bedtime. 04/20/21 07/19/21  Dessa Phi, DO   Insulin Pen Needle (B-D ULTRAFINE III SHORT PEN) 31G X 8 MM MISC 1 each by Does not apply route as directed. 09/02/15   Cassandria Anger, MD  Lancets MISC 1 each by Does not apply route 4 (four) times daily. 11/08/15   Cassandria Anger, MD  mupirocin ointment (BACTROBAN) 2 % Apply topically daily. 04/21/21   Dessa Phi, DO  nitroGLYCERIN (NITRODUR - DOSED IN MG/24 HR) 0.2 mg/hr patch Place 1 patch (0.2 mg total) onto the skin daily. 05/02/21   Newt Minion, MD  pentoxifylline (TRENTAL) 400 MG CR tablet Take 1 tablet (400 mg total) by mouth 3 (three) times daily with meals. 05/02/21   Newt Minion, MD  tamsulosin (FLOMAX) 0.4 MG CAPS capsule Take 0.4 mg by mouth at bedtime. 03/29/21   [provider]    Physical Exam: Vitals:   05/07/21 1659 05/07/21 1946 05/07/21 1957  BP: 118/74 (!) 153/87 (!) 143/87  Pulse: 100 78 (!) 105  Resp: 18  15  Temp: 98.4 F (36.9 C) 97.8 F (36.6 C)   TempSrc: Oral Oral   SpO2: 100% 98%    Physical Exam Constitutional:      General: He is not in acute distress.    Appearance: Normal appearance.  HENT:     Head: Normocephalic and atraumatic.     Mouth/Throat:     Mouth: Mucous membranes are moist.     Pharynx: Oropharynx is clear.  Eyes:     Extraocular Movements: Extraocular movements intact.     Pupils: Pupils are equal, round, and reactive to light.  Cardiovascular:     Rate and Rhythm: Normal rate and regular rhythm.     Pulses: Normal pulses.     Heart sounds: Normal heart sounds.  Pulmonary:     Effort: Pulmonary effort is normal. No respiratory distress.     Breath sounds: Normal breath sounds.  Abdominal:     General: Bowel sounds are normal. There is no distension.     Palpations: Abdomen is soft.     Tenderness: There is no abdominal tenderness.  Musculoskeletal:        General: No swelling or deformity.  Skin:    General: Skin is warm and dry.     Comments: Bandaged left foot but surrounding erythema, edema,  warmth concerning for cellulitis.  Neurological:     General: No focal deficit present.     Mental Status: Mental status is at baseline.     Labs on Admission: I have personally reviewed following labs and imaging studies  CBC: Recent Labs  Lab 05/07/21 1709  WBC 13.4*  NEUTROABS 11.1*  HGB 8.5*  HCT 27.3*  MCV 101.1*  PLT 254    Basic Metabolic Panel: Recent Labs  Lab 05/07/21 1709  NA 134*  K 4.1  CL 92*  CO2 28  GLUCOSE 185*  BUN 57*  CREATININE 6.26*  CALCIUM 8.9    GFR: CrCl cannot be calculated (Unknown ideal weight.).  Liver Function Tests: Recent Labs  Lab 05/07/21 1709  AST 29  ALT 24  ALKPHOS 121  BILITOT 1.3*  PROT 6.8  ALBUMIN 2.5*    Urine analysis: No results found for: COLORURINE, APPEARANCEUR, LABSPEC, PHURINE, GLUCOSEU, HGBUR, BILIRUBINUR, KETONESUR, PROTEINUR, UROBILINOGEN, NITRITE, LEUKOCYTESUR  Radiological Exams on Admission: DG Foot Complete Left  Result Date: 05/07/2021 CLINICAL DATA:  Posterior and lateral left foot pain and wounds for 3 months. History of diabetes. EXAM: LEFT FOOT - COMPLETE 3+ VIEW COMPARISON:  04/10/2021 FINDINGS: Extensive vascular calcifications in the soft tissues. Degenerative changes in the interphalangeal joints and intertarsal joints. Plantar calcaneal spur. No evidence of acute fracture or dislocation. No focal bone lesion or bone erosion. No cortical erosion or sclerosis to suggest osteomyelitis. No radiopaque soft tissue foreign bodies or soft tissue gas identified. IMPRESSION: No acute bony abnormalities. No radiographic evidence of osteomyelitis. Extensive vascular calcifications. Electronically Signed   By: Lucienne Capers M.D.   On: 05/07/2021 19:19    EKG: Independently reviewed.  Normal sinus rhythm at 99 bpm.  Evidence of possible left atrial enlargement.  Nonspecific ST and T wave changes in the inferior and lateral leads, New from previous.  Assessment/Plan Principal Problem:    Cellulitis Active Problems:   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[place])   Hyperlipidemia   Essential hypertension, benign   PAD (peripheral artery disease) ([place])   ESRD on hemodialysis ([place])   Ischemic ulcer diabetic foot ([place])  Cellulitis Diabetic Foot > Patient with increased pain, discharge, erythema, edema at his left foot around the site of chronic ischemic ulcer that had been improving after revascularization on 6/15. > Foot x-ray without evidence of osteomyelitis.  Leukocytosis to 13.4. > Started on vancomycin in the ED. - Consult placed to orthopedics - Continue vancomycin, add ceftriaxone and Flagyl - Trend fever curve and WBC  PAD History of TIA Hyperlipidemia > History of ischemic ulcer that has been healing since revascularization on 6/15 as above. - Continue with home atorvastatin, aspirin, Plavix, pentoxifylline  CAD > History of CAD status post CABG in 2021 > Noted to have some new T wave inversions in inferior lateral leads, no chest pain or shortness of breath. - We will continue with home atorvastatin, aspirin, Plavix - Check troponin  ESRD on HD > Last HD session was Friday, no urgent need for dialysis based on initial lab work-up. > Was noted to transiently have some paleness in his left upper extremity which has resolved.  Vascular ultrasound of the left upper extremity have been ordered.  Good thrill at left lower extremity fistula. - Will benefit from nephrology consult while here as he will likely need to have dialysis - Check magnesium - Avoid nephrotoxic agents - Trend renal function and electrolytes - Continue home Auryxia, calcitriol - Follow-up left upper extremity vascular ultrasound  Macrocytic anemia > Hemoglobin 8.5 near his baseline of 9.  MCV 101.  Likely component of anemia of chronic kidney disease. - Trend CBC  Diabetes > 5 units of long-acting insulin at home. - SSI  Anxiety - Continue home as  needed clonazepam   DVT prophylaxis: Heparin  Code Status:   Full  Family Communication:  Attempted to contact spouse by phone, however there was no answer. Disposition Plan:   Patient is from:  Home  Anticipated DC to:  Home  Anticipated DC date:  1 to 4 days  Anticipated DC barriers: None  Consults called:  Orthopedics consulted, Dr. Ninfa Linden. Admission status:  Observation, telemetry   Severity of Illness: The appropriate patient status for this patient is OBSERVATION. Observation status is judged to be reasonable and necessary in order to provide the required intensity of service to ensure the patient's safety. The patient's presenting symptoms, physical exam findings, and initial radiographic and laboratory data in the context of their medical condition is felt to place them at decreased risk for further clinical deterioration. Furthermore, it is anticipated that the patient will be medically stable for discharge from the hospital within 2 midnights of admission. The following factors support the patient status of observation.   " The patient's presenting symptoms include increased foot pain, discharge, erythema, edema. " The physical exam findings include erythema, edema, pain of left foot.  Bandaged when seen, can see current status of ulcer on image taken in ED. " The initial radiographic and laboratory data are hemoglobin 8.5, WBC 13.4, PT 15.7, PTT 37, INR 1.3, creatinine 6.26 consistent with ESRD.  Left foot x-ray without evidence of osteomyelitis.  Lactic acid normal.  Marcelyn Bruins MD [facility]  How to contact the [facility] Attending or Consulting provider [place] or covering provider during after hours [place], for this patient?   Check the care team in [facility] and look for a) attending/consulting [facility] provider listed and b) the [facility] team listed Log into [email or web address] and use [place]'s universal password to access. If you do not have the password, please contact the  hospital  operator. Locate the [facility] provider you are looking for under [facility] and page to a number that you can be directly reached. If you still have difficulty reaching the provider, please page the [facility] (Director on Call) for the Hospitalists listed on amion for assistance.  05/07/2021, 8:34 PM

## 2021-05-08 ENCOUNTER — Observation Stay (HOSPITAL_COMMUNITY): Payer: Managed Care, Other (non HMO)

## 2021-05-08 ENCOUNTER — Encounter (HOSPITAL_COMMUNITY): Payer: Self-pay | Admitting: Internal Medicine

## 2021-05-08 DIAGNOSIS — D62 Acute posthemorrhagic anemia: Secondary | ICD-10-CM | POA: Diagnosis not present

## 2021-05-08 DIAGNOSIS — Z89512 Acquired absence of left leg below knee: Secondary | ICD-10-CM | POA: Diagnosis not present

## 2021-05-08 DIAGNOSIS — D631 Anemia in chronic kidney disease: Secondary | ICD-10-CM | POA: Diagnosis present

## 2021-05-08 DIAGNOSIS — E11621 Type 2 diabetes mellitus with foot ulcer: Secondary | ICD-10-CM | POA: Diagnosis present

## 2021-05-08 DIAGNOSIS — F32A Depression, unspecified: Secondary | ICD-10-CM | POA: Diagnosis present

## 2021-05-08 DIAGNOSIS — N2581 Secondary hyperparathyroidism of renal origin: Secondary | ICD-10-CM | POA: Diagnosis present

## 2021-05-08 DIAGNOSIS — S88112A Complete traumatic amputation at level between knee and ankle, left lower leg, initial encounter: Secondary | ICD-10-CM | POA: Diagnosis not present

## 2021-05-08 DIAGNOSIS — E11649 Type 2 diabetes mellitus with hypoglycemia without coma: Secondary | ICD-10-CM | POA: Diagnosis not present

## 2021-05-08 DIAGNOSIS — Z4781 Encounter for orthopedic aftercare following surgical amputation: Secondary | ICD-10-CM | POA: Diagnosis present

## 2021-05-08 DIAGNOSIS — E1152 Type 2 diabetes mellitus with diabetic peripheral angiopathy with gangrene: Secondary | ICD-10-CM | POA: Diagnosis present

## 2021-05-08 DIAGNOSIS — L89302 Pressure ulcer of unspecified buttock, stage 2: Secondary | ICD-10-CM | POA: Diagnosis not present

## 2021-05-08 DIAGNOSIS — I132 Hypertensive heart and chronic kidney disease with heart failure and with stage 5 chronic kidney disease, or end stage renal disease: Secondary | ICD-10-CM | POA: Diagnosis present

## 2021-05-08 DIAGNOSIS — D539 Nutritional anemia, unspecified: Secondary | ICD-10-CM | POA: Diagnosis present

## 2021-05-08 DIAGNOSIS — G2581 Restless legs syndrome: Secondary | ICD-10-CM | POA: Diagnosis present

## 2021-05-08 DIAGNOSIS — R339 Retention of urine, unspecified: Secondary | ICD-10-CM | POA: Diagnosis not present

## 2021-05-08 DIAGNOSIS — L03116 Cellulitis of left lower limb: Secondary | ICD-10-CM | POA: Diagnosis present

## 2021-05-08 DIAGNOSIS — I2510 Atherosclerotic heart disease of native coronary artery without angina pectoris: Secondary | ICD-10-CM | POA: Diagnosis not present

## 2021-05-08 DIAGNOSIS — N189 Chronic kidney disease, unspecified: Secondary | ICD-10-CM | POA: Diagnosis not present

## 2021-05-08 DIAGNOSIS — I70262 Atherosclerosis of native arteries of extremities with gangrene, left leg: Secondary | ICD-10-CM | POA: Diagnosis present

## 2021-05-08 DIAGNOSIS — I739 Peripheral vascular disease, unspecified: Secondary | ICD-10-CM | POA: Diagnosis not present

## 2021-05-08 DIAGNOSIS — M79672 Pain in left foot: Secondary | ICD-10-CM | POA: Diagnosis present

## 2021-05-08 DIAGNOSIS — N186 End stage renal disease: Secondary | ICD-10-CM | POA: Diagnosis not present

## 2021-05-08 DIAGNOSIS — I5023 Acute on chronic systolic (congestive) heart failure: Secondary | ICD-10-CM | POA: Diagnosis not present

## 2021-05-08 DIAGNOSIS — Z992 Dependence on renal dialysis: Secondary | ICD-10-CM | POA: Diagnosis not present

## 2021-05-08 DIAGNOSIS — R34 Anuria and oliguria: Secondary | ICD-10-CM | POA: Diagnosis not present

## 2021-05-08 DIAGNOSIS — I96 Gangrene, not elsewhere classified: Secondary | ICD-10-CM | POA: Diagnosis not present

## 2021-05-08 DIAGNOSIS — Z951 Presence of aortocoronary bypass graft: Secondary | ICD-10-CM | POA: Diagnosis not present

## 2021-05-08 DIAGNOSIS — E46 Unspecified protein-calorie malnutrition: Secondary | ICD-10-CM | POA: Diagnosis present

## 2021-05-08 DIAGNOSIS — E119 Type 2 diabetes mellitus without complications: Secondary | ICD-10-CM | POA: Diagnosis not present

## 2021-05-08 DIAGNOSIS — G546 Phantom limb syndrome with pain: Secondary | ICD-10-CM | POA: Diagnosis present

## 2021-05-08 DIAGNOSIS — E785 Hyperlipidemia, unspecified: Secondary | ICD-10-CM | POA: Diagnosis present

## 2021-05-08 DIAGNOSIS — E1165 Type 2 diabetes mellitus with hyperglycemia: Secondary | ICD-10-CM | POA: Diagnosis present

## 2021-05-08 DIAGNOSIS — E11610 Type 2 diabetes mellitus with diabetic neuropathic arthropathy: Secondary | ICD-10-CM | POA: Diagnosis present

## 2021-05-08 DIAGNOSIS — Z823 Family history of stroke: Secondary | ICD-10-CM | POA: Diagnosis not present

## 2021-05-08 DIAGNOSIS — Z20822 Contact with and (suspected) exposure to covid-19: Secondary | ICD-10-CM | POA: Diagnosis present

## 2021-05-08 DIAGNOSIS — Z809 Family history of malignant neoplasm, unspecified: Secondary | ICD-10-CM | POA: Diagnosis not present

## 2021-05-08 DIAGNOSIS — Z794 Long term (current) use of insulin: Secondary | ICD-10-CM | POA: Diagnosis not present

## 2021-05-08 DIAGNOSIS — Z8673 Personal history of transient ischemic attack (TIA), and cerebral infarction without residual deficits: Secondary | ICD-10-CM | POA: Diagnosis not present

## 2021-05-08 DIAGNOSIS — L03115 Cellulitis of right lower limb: Secondary | ICD-10-CM | POA: Diagnosis not present

## 2021-05-08 DIAGNOSIS — I252 Old myocardial infarction: Secondary | ICD-10-CM | POA: Diagnosis not present

## 2021-05-08 DIAGNOSIS — Z833 Family history of diabetes mellitus: Secondary | ICD-10-CM | POA: Diagnosis not present

## 2021-05-08 DIAGNOSIS — I10 Essential (primary) hypertension: Secondary | ICD-10-CM | POA: Diagnosis not present

## 2021-05-08 DIAGNOSIS — Z8249 Family history of ischemic heart disease and other diseases of the circulatory system: Secondary | ICD-10-CM | POA: Diagnosis not present

## 2021-05-08 DIAGNOSIS — L97429 Non-pressure chronic ulcer of left heel and midfoot with unspecified severity: Secondary | ICD-10-CM | POA: Diagnosis present

## 2021-05-08 DIAGNOSIS — G4701 Insomnia due to medical condition: Secondary | ICD-10-CM | POA: Diagnosis not present

## 2021-05-08 DIAGNOSIS — E8809 Other disorders of plasma-protein metabolism, not elsewhere classified: Secondary | ICD-10-CM | POA: Diagnosis not present

## 2021-05-08 DIAGNOSIS — E1122 Type 2 diabetes mellitus with diabetic chronic kidney disease: Secondary | ICD-10-CM | POA: Diagnosis not present

## 2021-05-08 LAB — CBC
HCT: 25 % — ABNORMAL LOW (ref 39.0–52.0)
HCT: 26.4 % — ABNORMAL LOW (ref 39.0–52.0)
Hemoglobin: 7.8 g/dL — ABNORMAL LOW (ref 13.0–17.0)
Hemoglobin: 8.2 g/dL — ABNORMAL LOW (ref 13.0–17.0)
MCH: 31.2 pg (ref 26.0–34.0)
MCH: 30.9 pg (ref 26.0–34.0)
MCHC: 31.2 g/dL (ref 30.0–36.0)
MCHC: 31.1 g/dL (ref 30.0–36.0)
MCV: 100 fL (ref 80.0–100.0)
MCV: 99.6 fL (ref 80.0–100.0)
Platelets: 149 10*3/uL — ABNORMAL LOW (ref 150–400)
Platelets: 149 10*3/uL — ABNORMAL LOW (ref 150–400)
RBC: 2.5 MIL/uL — ABNORMAL LOW (ref 4.22–5.81)
RBC: 2.65 MIL/uL — ABNORMAL LOW (ref 4.22–5.81)
RDW: 15.8 % — ABNORMAL HIGH (ref 11.5–15.5)
RDW: 16.1 % — ABNORMAL HIGH (ref 11.5–15.5)
WBC: 11.7 10*3/uL — ABNORMAL HIGH (ref 4.0–10.5)
WBC: 11.3 10*3/uL — ABNORMAL HIGH (ref 4.0–10.5)
nRBC: 0 % (ref 0.0–0.2)
nRBC: 0 % (ref 0.0–0.2)

## 2021-05-08 LAB — COMPREHENSIVE METABOLIC PANEL
ALT: 19 U/L (ref 0–44)
AST: 25 U/L (ref 15–41)
Albumin: 2.1 g/dL — ABNORMAL LOW (ref 3.5–5.0)
Alkaline Phosphatase: 98 U/L (ref 38–126)
Anion gap: 15 (ref 5–15)
BUN: 61 mg/dL — ABNORMAL HIGH (ref 8–23)
CO2: 26 mmol/L (ref 22–32)
Calcium: 8.3 mg/dL — ABNORMAL LOW (ref 8.9–10.3)
Chloride: 93 mmol/L — ABNORMAL LOW (ref 98–111)
Creatinine, Ser: 6.75 mg/dL — ABNORMAL HIGH (ref 0.61–1.24)
GFR, Estimated: 8 mL/min — ABNORMAL LOW (ref >60)
Glucose, Bld: 221 mg/dL — ABNORMAL HIGH (ref 70–99)
Potassium: 4.4 mmol/L (ref 3.5–5.1)
Sodium: 134 mmol/L — ABNORMAL LOW (ref 135–145)
Total Bilirubin: 1.1 mg/dL (ref 0.3–1.2)
Total Protein: 5.8 g/dL — ABNORMAL LOW (ref 6.5–8.1)

## 2021-05-08 LAB — GLUCOSE, CAPILLARY
Glucose-Capillary: 172 mg/dL — ABNORMAL HIGH (ref 70–99)
Glucose-Capillary: 94 mg/dL (ref 70–99)
Glucose-Capillary: 100 mg/dL — ABNORMAL HIGH (ref 70–99)

## 2021-05-08 LAB — TROPONIN I (HIGH SENSITIVITY): Troponin I (High Sensitivity): 211 ng/L (ref <18)

## 2021-05-08 MED ORDER — HEPARIN SODIUM (PORCINE) 1000 UNIT/ML DIALYSIS: 1000.0000 [IU] | INTRAMUSCULAR | Status: DC | PRN

## 2021-05-08 MED ORDER — SODIUM CHLORIDE 0.9 % IV SOLN: 100.0000 mL | INTRAVENOUS | Status: DC | PRN

## 2021-05-08 MED ORDER — DARBEPOETIN ALFA 150 MCG/0.3ML IJ SOSY
150.0000 ug | PREFILLED_SYRINGE | INTRAMUSCULAR | Status: DC
  Administered 2021-05-17: 150 ug via INTRAVENOUS
  Filled 2021-05-08 (×2): qty 0.3

## 2021-05-08 MED ORDER — POLYETHYLENE GLYCOL 3350 17 G PO PACK
17.0000 g | PACK | Freq: Once | ORAL | Status: AC
  Administered 2021-05-08: 17 g via ORAL
  Filled 2021-05-08: qty 1

## 2021-05-08 MED ORDER — VANCOMYCIN HCL IN DEXTROSE 750-5 MG/150ML-% IV SOLN
750.0000 mg | INTRAVENOUS | Status: DC
  Administered 2021-05-08: 750 mg via INTRAVENOUS
  Filled 2021-05-08 (×3): qty 150

## 2021-05-08 MED ORDER — LIDOCAINE HCL (PF) 1 % IJ SOLN: 5.0000 mL | INTRAMUSCULAR | Status: DC | PRN

## 2021-05-08 MED ORDER — ALTEPLASE 2 MG IJ SOLR: 2.0000 mg | Freq: Once | INTRAMUSCULAR | Status: DC | PRN

## 2021-05-08 MED ORDER — LIDOCAINE-PRILOCAINE 2.5-2.5 % EX CREA: 1.0000 "application " | TOPICAL_CREAM | CUTANEOUS | Status: DC | PRN

## 2021-05-08 MED ORDER — VANCOMYCIN HCL 750 MG/150ML IV SOLN
INTRAVENOUS | Status: AC
  Filled 2021-05-08: qty 150

## 2021-05-08 MED ORDER — CHLORHEXIDINE GLUCONATE CLOTH 2 % EX PADS
6.0000 | MEDICATED_PAD | Freq: Every day | CUTANEOUS | Status: DC
  Administered 2021-05-08 – 2021-05-17 (×8): 6 via TOPICAL

## 2021-05-08 MED ORDER — HYDROCODONE-ACETAMINOPHEN 5-325 MG PO TABS
ORAL_TABLET | ORAL | Status: AC
  Administered 2021-05-08: 1 via ORAL
  Filled 2021-05-08: qty 1

## 2021-05-08 MED ORDER — ONDANSETRON HCL 4 MG/2ML IJ SOLN
INTRAMUSCULAR | Status: AC
  Administered 2021-05-08: 4 mg via INTRAVENOUS
  Filled 2021-05-08: qty 2

## 2021-05-08 MED ORDER — ONDANSETRON HCL 4 MG/2ML IJ SOLN
4.0000 mg | Freq: Three times a day (TID) | INTRAMUSCULAR | Status: DC | PRN
  Administered 2021-05-09 – 2021-05-12 (×7): 4 mg via INTRAVENOUS
  Filled 2021-05-08 (×8): qty 2

## 2021-05-08 MED ORDER — PENTAFLUOROPROP-TETRAFLUOROETH EX AERO: 1.0000 "application " | INHALATION_SPRAY | CUTANEOUS | Status: DC | PRN

## 2021-05-08 NOTE — Progress Notes (Signed)
Returned back from dislysis at 6:15 BP 127/82 Heart rate 111. he is asking for something for nausea. Text sent to [facility] night coverage

## 2021-05-08 NOTE — Progress Notes (Signed)
Progress Note    Maxwell Harmon  JKD:[id number] DOB: 1956-08-20  DOA: 05/07/2021 PCP: [name], DO    Brief Narrative:   Medical records reviewed and are as summarized below:  Maxwell Harmon is an 65 y.o. male with medical history significant of CHF, ESRD, hypertension, CAD, PAD history of limb ischemia, hyperlipidemia, diabetes, TIA, anxiety who presents with worsening skin changes and pain of left foot.  Dr. Sharol Given to see in the AM.  Assessment/Plan:   Principal Problem:   Cellulitis Active Problems:   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[place])   Hyperlipidemia   Essential hypertension, benign   PAD (peripheral artery disease) (HCC)   ESRD on hemodialysis ([place])   Ischemic ulcer diabetic foot ([place])   Cellulitis Diabetic Foot > Patient with increased pain, discharge, erythema, edema at his left foot around the site of chronic ischemic ulcer that had been improving after revascularization on 6/15. > Foot x-ray without evidence of osteomyelitis.  Leukocytosis to 13.4. - Consult placed to orthopedics- Dr. Sharol Given to see in the AM - Continue vancomycin, add ceftriaxone and Flagyl - Trend fever curve and WBC   PAD History of TIA Hyperlipidemia > History of ischemic ulcer that has been healing since revascularization on 6/15 as above. - Continue with home atorvastatin, aspirin, Plavix, pentoxifylline   ESRD on HD > Last HD session was Friday, no urgent need for dialysis based on initial lab work-up. >nephrology consult   Macrocytic anemia > Hemoglobin 8.5 near his baseline of 9.  MCV 101.  Likely component of anemia of chronic kidney disease. - Trend CBC   Diabetes  - SSI   Anxiety - Continue home as needed clonazepam    Family Communication/Anticipated D/C date and plan/Code Status   DVT prophylaxis: heparin Code Status: Full Code.  Disposition Plan: Status is: Observation  The patient will require care spanning > 2  midnights and should be moved to inpatient because: Inpatient level of care appropriate due to severity of illness  Dispo: The patient is from: Home              Anticipated d/c is to: Home              Patient currently is not medically stable to d/c.   Difficult to place patient No         Medical Consultants:   ortho  Subjective:   No current complaints  Objective:    Vitals:   05/07/21 2347 05/08/21 0238 05/08/21 0517 05/08/21 1015  BP: 113/70 (!) 94/59 112/70 105/68  Pulse: (!) 101 87 96 95  Resp: 18 20 19 18   Temp: 98.6 F (37 C) 98.9 F (37.2 C) (!) 97.4 F (36.3 C) 97.9 F (36.6 C)  TempSrc: Oral  Oral Oral  SpO2: 90%  94% 100%  Weight: 88.9 kg       Intake/Output Summary (Last 24 hours) at 05/08/2021 1335 Last data filed at 05/08/2021 1100 Gross per 24 hour  Intake 656.35 ml  Output --  Net 656.35 ml   Filed Weights   05/07/21 2347  Weight: 88.9 kg    Exam:  General: Appearance:     Overweight male in no acute distress     Lungs:     respirations unlabored  Heart:    Normal heart rate.    MS:   Foot wrapped   Neurologic:   Awake, alert, oriented x 3. No apparent focal neurological  defect.      Data Reviewed:   I have personally reviewed following labs and imaging studies:  Labs: Labs show the following:   Basic Metabolic Panel: Recent Labs  Lab 05/07/21 1709 05/08/21 0120  NA 134* 134*  K 4.1 4.4  CL 92* 93*  CO2 28 26  GLUCOSE 185* 221*  BUN 57* 61*  CREATININE 6.26* 6.75*  CALCIUM 8.9 8.3*   GFR Estimated Creatinine Clearance: 11.8 mL/min (A) (by C-G formula based on SCr of 6.75 mg/dL (H)). Liver Function Tests: Recent Labs  Lab 05/07/21 1709 05/08/21 0120  AST 29 25  ALT 24 19  ALKPHOS 121 98  BILITOT 1.3* 1.1  PROT 6.8 5.8*  ALBUMIN 2.5* 2.1*   No results for input(s): LIPASE, AMYLASE in the last 168 hours. No results for input(s): AMMONIA in the last 168 hours. Coagulation profile Recent Labs   Lab 05/07/21 1725  INR 1.3*    CBC: Recent Labs  Lab 05/07/21 1709 05/08/21 0120 05/08/21 1232  WBC 13.4* 11.7* 11.3*  NEUTROABS 11.1*  --   --   HGB 8.5* 7.8* 8.2*  HCT 27.3* 25.0* 26.4*  MCV 101.1* 100.0 99.6  PLT 174 149* 149*   Cardiac Enzymes: No results for input(s): CKTOTAL, CKMB, CKMBINDEX, TROPONINI in the last 168 hours. BNP (last 3 results) No results for input(s): PROBNP in the last 8760 hours. CBG: Recent Labs  Lab 05/08/21 1229  GLUCAP 172*   D-Dimer: No results for input(s): DDIMER in the last 72 hours. Hgb A1c: No results for input(s): HGBA1C in the last 72 hours. Lipid Profile: No results for input(s): CHOL, HDL, LDLCALC, TRIG, CHOLHDL, LDLDIRECT in the last 72 hours. Thyroid function studies: No results for input(s): TSH, T4TOTAL, T3FREE, THYROIDAB in the last 72 hours.  Invalid input(s): FREET3 Anemia work up: No results for input(s): VITAMINB12, FOLATE, FERRITIN, TIBC, IRON, RETICCTPCT in the last 72 hours. Sepsis Labs: Recent Labs  Lab 05/07/21 1709 05/07/21 2033 05/08/21 0120 05/08/21 1232  WBC 13.4*  --  11.7* 11.3*  LATICACIDVEN 1.6 1.4  --   --     Microbiology Recent Results (from the past 240 hour(s))  Blood Culture (routine x 2)     Status: None (Preliminary result)   Collection Time: 05/07/21  5:39 PM   Specimen: BLOOD RIGHT WRIST  Result Value Ref Range Status   Specimen Description BLOOD RIGHT WRIST  Final   Special Requests   Final    BOTTLES DRAWN AEROBIC AND ANAEROBIC Blood Culture results may not be optimal due to an inadequate volume of blood received in culture bottles   Culture   Final    NO GROWTH < 24 HOURS Performed at [facility], [place] [address] [place], [place] [address]    Report Status PENDING  Incomplete  Blood Culture (routine x 2)     Status: None (Preliminary result)   Collection Time: 05/07/21  8:33 PM   Specimen: BLOOD  Result Value Ref Range Status   Specimen Description BLOOD RIGHT  ANTECUBITAL  Final   Special Requests   Final    BOTTLES DRAWN AEROBIC AND ANAEROBIC Blood Culture results may not be optimal due to an inadequate volume of blood received in culture bottles   Culture   Final    NO GROWTH < 12 HOURS Performed at [facility], [place] [name], [place], [place] [address]    Report Status PENDING  Incomplete  Resp Panel by RT-PCR (Flu A&B, Covid)  Nasopharyngeal Swab     Status: None   Collection Time: 05/07/21  9:35 PM   Specimen: Nasopharyngeal Swab; Nasopharyngeal(NP) swabs in vial transport medium  Result Value Ref Range Status   SARS Coronavirus 2 by RT PCR NEGATIVE NEGATIVE Final    Comment: (NOTE) SARS-CoV-2 target nucleic acids are NOT DETECTED.  The SARS-CoV-2 RNA is generally detectable in upper respiratory specimens during the acute phase of infection. The lowest concentration of SARS-CoV-2 viral copies this assay can detect is 138 copies/mL. A negative result does not preclude SARS-Cov-2 infection and should not be used as the sole basis for treatment or other patient management decisions. A negative result may occur with  improper specimen collection/handling, submission of specimen other than nasopharyngeal swab, presence of viral mutation(s) within the areas targeted by this assay, and inadequate number of viral copies(<138 copies/mL). A negative result must be combined with clinical observations, patient history, and epidemiological information. The expected result is Negative.  Fact Sheet for Patients:  EntrepreneurPulse.com.au  Fact Sheet for Healthcare Providers:  IncredibleEmployment.be  This test is no t yet approved or cleared by the [place] FDA and  has been authorized for detection and/or diagnosis of SARS-CoV-2 by FDA under an Emergency Use Authorization (EUA). This EUA will remain  in effect (meaning this test can be used) for the duration of the COVID-19 declaration under  Section 564(b)(1) of the Act, 21 U.S.C.section 360bbb-3(b)(1), unless the authorization is terminated  or revoked sooner.       Influenza A by PCR NEGATIVE NEGATIVE Final   Influenza B by PCR NEGATIVE NEGATIVE Final    Comment: (NOTE) The Xpert Xpress SARS-CoV-2/FLU/RSV plus assay is intended as an aid in the diagnosis of influenza from Nasopharyngeal swab specimens and should not be used as a sole basis for treatment. Nasal washings and aspirates are unacceptable for Xpert Xpress SARS-CoV-2/FLU/RSV testing.  Fact Sheet for Patients: EntrepreneurPulse.com.au  Fact Sheet for Healthcare Providers: IncredibleEmployment.be  This test is not yet approved or cleared by the [place] FDA and has been authorized for detection and/or diagnosis of SARS-CoV-2 by FDA under an Emergency Use Authorization (EUA). This EUA will remain in effect (meaning this test can be used) for the duration of the COVID-19 declaration under Section 564(b)(1) of the Act, 21 U.S.C. section 360bbb-3(b)(1), unless the authorization is terminated or revoked.  Performed at [facility], [place] [address], [place], [place] [address]     Procedures and diagnostic studies:  DG Foot Complete Left  Result Date: 05/07/2021 CLINICAL DATA:  Posterior and lateral left foot pain and wounds for 3 months. History of diabetes. EXAM: LEFT FOOT - COMPLETE 3+ VIEW COMPARISON:  04/10/2021 FINDINGS: Extensive vascular calcifications in the soft tissues. Degenerative changes in the interphalangeal joints and intertarsal joints. Plantar calcaneal spur. No evidence of acute fracture or dislocation. No focal bone lesion or bone erosion. No cortical erosion or sclerosis to suggest osteomyelitis. No radiopaque soft tissue foreign bodies or soft tissue gas identified. IMPRESSION: No acute bony abnormalities. No radiographic evidence of osteomyelitis. Extensive vascular calcifications. Electronically  Signed   By: Lucienne Capers M.D.   On: 05/07/2021 19:19   VAS [place] UPPER EXTREMITY ARTERIAL DUPLEX  Result Date: 05/08/2021  UPPER EXTREMITY DUPLEX STUDY Patient Name:  MYRTLE BARNHARD  Date of Exam:   05/08/2021 Medical Rec #: [id number]        Accession #:    [id number] Date of Birth: Apr 30, 1956  Patient Gender: M Patient Age:   20Y Exam Location:  [facility] Procedure:      VAS [place] UPPER EXTREMITY ARTERIAL DUPLEX Referring Phys: [id number] Sheppard Coil B MELVIN --------------------------------------------------------------------------------  Indications: Intermittent pallor, poor capillary refill, and weak radial pulse              in the left upper extremity. History:     Patient has a history of Left upper extremity AV fistula (basilic).  Risk Factors:  Hypertension, hyperlipidemia, Diabetes, coronary artery disease. Other Factors: PAD with history of left LE ischemia, ESRD Comparison Study: No prior study on file Performing Technologist: [name] RDMS,RVT  Examination Guidelines: A complete evaluation includes B-mode imaging, spectral Doppler, color Doppler, and power Doppler as needed of all accessible portions of each vessel. Bilateral testing is considered an integral part of a complete examination. Limited examinations for reoccurring indications may be performed as noted.   Summary:  Left: 50-74% stenosis noted at the anastomosis. Radial artery       appears patent with reversal of flow. *See table(s) above for measurements and observations.    Preliminary     Medications:    aspirin EC  81 mg Oral Daily   atorvastatin  80 mg Oral Daily   calcitRIOL  0.25 mcg Oral BID   Chlorhexidine Gluconate Cloth  6 each Topical Q0600   clopidogrel  75 mg Oral Daily   [START ON 05/10/2021] darbepoetin ([name]) injection - DIALYSIS  150 mcg Intravenous Q Wed-HD   escitalopram  10 mg Oral Daily   ferric citrate  630 mg Oral TID   heparin  5,000 Units Subcutaneous Q8H   insulin aspart  0-6 Units  Subcutaneous TID WC   pentoxifylline  400 mg Oral TID WC   sodium chloride flush  3 mL Intravenous Q12H   tamsulosin  0.4 mg Oral QHS   Continuous Infusions:  sodium chloride     sodium chloride     cefTRIAXone (ROCEPHIN)  IV 2 g (05/08/21 0101)   metronidazole Stopped (05/08/21 0651)   vancomycin       LOS: 0 days   Geradine Girt  [facility]   How to contact the [facility] Attending or Consulting provider [place] or covering provider during after hours [place], for this patient?  Check the care team in [facility] [place] and look for a) attending/consulting TRH provider listed and b) the [facility] team listed Log into [email or web address] and use [place]'s universal password to access. If you do not have the password, please contact the hospital operator. Locate the [facility] provider you are looking for under [facility] and page to a number that you can be directly reached. If you still have difficulty reaching the provider, please page the [facility] (Director on Call) for the Hospitalists listed on amion for assistance.  05/08/2021, 1:35 PM

## 2021-05-08 NOTE — Consult Note (Signed)
ESRD Consult Note  Requesting provider: Geradine Girt, DO  Outpatient dialysis unit: [facility] Outpatient dialysis schedule: MWF  Assessment/Recommendations:   ESRD: had been on HD due to issues with CCPD: inadequate drainage/UF (catheter malfunction?) Outpatient orders: F180, 2k, 2.5cal, 137 na, 35bicarb, 400bfr, 16g, edw 86.5kg. hep 2k bous -outpatient CCPD with 3 exchanges, 2700 ml fill volume, fill time 30 minutes, drain time 30 minutes, dwell time 2 hours. -utilize stool softener or laxative if constipated--will order one dose for today -will arrange to have his PD cath flushed here (if not flushed last week) -HD today, next HD on 7/6  Cellulitis/diabetic foot -abx per primary service, ortho consulted  Volume/ hypertension: EDW 86.5kg. Will UF as tolerated  Anemia of Chronic Kidney Disease: Hemoglobin 7.8. Avoiding IV iron for now, receives mircera 150 mct, last dose 6/24, will order aranesp here  Secondary Hyperparathyroidism/Hyperphosphatemia: continue with home meds   Vascular access: AVF with good B/T. Will need to have PD cath flushed  DM2 w/ hyperglycemia -mgmt per primary service  # Additional recommendations: - Dose all meds for creatinine clearance < 10 ml/min  - Unless absolutely necessary, no MRIs with gadolinium.  - Implement save arm precautions.  Prefer needle sticks in the dorsum of the hands or wrists.  No blood pressure measurements in arm. - If blood transfusion is requested during hemodialysis sessions, please alert [place] prior to the session.  - If a hemodialysis catheter line culture is requested, please alert [place] as only hemodialysis nurses are able to collect those specimens.   Gean Quint, MD [facility]   History of Present Illness: Maxwell Harmon is a/an 65 y.o. 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  Found to have cellulitis due to discharge and erythema/edema of his left foot around the site of his chronic ischemic ulcer.  His x-ray was without evidence of osteomyelitis.  Was started on vancomycin, ceftriaxone, and Flagyl here. Recently admitted here for NSTEMI and CHF. Used ot be on CCPD, switched to HD due to drainage issues and issues with UF. Possible catheter malfunction. Has been doing intermittent HD via AVF which has been going well without incident. Has not had a BM since Saturday, took a miralax on Friday. He reports that he is not sure when he got his PD catheter flushed last. Discussed with his outpatient HD unit, and it's entirely unclear per staff if it was flushed last week. Otherwise, denies any fevers, chills, chest pain, SOB, abd pain, n/v.   Medications:  Current Facility-Administered Medications  Medication Dose Route Frequency Provider Last Rate Last Admin   acetaminophen (TYLENOL) tablet 650 mg  650 mg Oral Q6H PRN Marcelyn Bruins, MD   650 mg at 05/08/21 0962   Or   acetaminophen (TYLENOL) suppository 650 mg  650 mg Rectal Q6H PRN Marcelyn Bruins, MD       aspirin EC tablet 81 mg  81 mg Oral Daily Marcelyn Bruins, MD       atorvastatin (LIPITOR) tablet 80 mg  80 mg Oral Daily Marcelyn Bruins, MD       calcitRIOL (ROCALTROL) capsule 0.25 mcg  0.25 mcg Oral BID Marcelyn Bruins, MD       cefTRIAXone (ROCEPHIN) 2 g in sodium chloride 0.9 % 100 mL IVPB  2 g Intravenous  Q24H Marcelyn Bruins, MD 200 mL/hr at 05/08/21 0101 2 g at 05/08/21 0101   clonazePAM (KLONOPIN) tablet 0.5 mg  0.5 mg Oral BID PRN Marcelyn Bruins, MD       clopidogrel (PLAVIX) tablet 75 mg  75 mg Oral Daily Marcelyn Bruins, MD       escitalopram (LEXAPRO) tablet 10 mg  10 mg Oral Daily Marcelyn Bruins, MD       ferric citrate (AURYXIA) tablet 630 mg  630 mg Oral TID Marcelyn Bruins, MD   630 mg  at 05/08/21 0038   heparin injection 5,000 Units  5,000 Units Subcutaneous Q8H Marcelyn Bruins, MD   5,000 Units at 05/08/21 0548   HYDROcodone-acetaminophen (NORCO/VICODIN) 5-325 MG per tablet 1 tablet  1 tablet Oral Q4H PRN Marcelyn Bruins, MD   1 tablet at 05/07/21 2220   insulin aspart (novoLOG) injection 0-6 Units  0-6 Units Subcutaneous TID WC Marcelyn Bruins, MD       metroNIDAZOLE (FLAGYL) IVPB 500 mg  500 mg Intravenous Q8H Marcelyn Bruins, MD 100 mL/hr at 05/08/21 0551 500 mg at 05/08/21 0551   pentoxifylline (TRENTAL) CR tablet 400 mg  400 mg Oral TID WC Marcelyn Bruins, MD       polyethylene glycol (MIRALAX / GLYCOLAX) packet 17 g  17 g Oral Daily PRN Marcelyn Bruins, MD       sodium chloride flush (NS) 0.9 % injection 3 mL  3 mL Intravenous Q12H Marcelyn Bruins, MD   3 mL at 05/08/21 0038   tamsulosin (FLOMAX) capsule 0.4 mg  0.4 mg Oral QHS Marcelyn Bruins, MD   0.4 mg at 05/08/21 0038   vancomycin variable dose per unstable renal function (pharmacist dosing)   Does not apply See admin instructions Heloise Purpura, [place] Patient has no known allergies.  MEDICAL HISTORY Past Medical History:  Diagnosis Date   Diabetes mellitus without complication (HCC)    History of bleeding ulcers    History of TIAs    Hypertension    Low kidney function      SOCIAL HISTORY Social History   Socioeconomic History   Marital status: Married    Spouse name: Not on file   Number of children: Not on file   Years of education: Not on file   Highest education level: Not on file  Occupational History   Not on file  Tobacco Use   Smoking status: Never   Smokeless tobacco: Never  Vaping Use   Vaping Use: Never used  Substance and Sexual Activity   Alcohol use: Yes    Comment: social drink    Drug use: No   Sexual activity: Not on file  Other Topics Concern   Not on file  Social History Narrative   Not on file   Social  Determinants of Health   Financial Resource Strain: Low Risk    Difficulty of Paying Living Expenses: Not very hard  Food Insecurity: No Food Insecurity   Worried About Running Out of Food in the Last Year: Never true   [place] in the Last Year: Never true  Transportation Needs: No Transportation Needs   Lack of Transportation (Medical): No   Lack of Transportation (Non-Medical): No  Physical Activity: Not on file  Stress: Not on file  Social Connections: Not on file  Intimate Partner Violence: Not on file  FAMILY HISTORY Family History  Problem Relation Age of Onset   Stroke Mother    Stroke Father    Cancer Father    Cancer Brother      Review of Systems: 12 systems were reviewed and negative except per HPI  Physical Exam: Vitals:   05/08/21 0238 05/08/21 0517  BP: (!) 94/59 112/70  Pulse: 87 96  Resp: 20 19  Temp: 98.9 F (37.2 C) (!) 97.4 F (36.3 C)  SpO2:  94%   No intake/output data recorded.  Intake/Output Summary (Last 24 hours) at 05/08/2021 0741 Last data filed at 05/08/2021 0551 Gross per 24 hour  Intake 196.35 ml  Output --  Net 196.35 ml   General: well-appearing, no acute distress HEENT: anicteric sclera, MMM CV: normal rate, no murmurs, no edema Lungs: bilateral chest rise, normal wob Abd: soft, non-tender, non-distended Skin: no visible lesions or rashes Psych: alert, engaged, appropriate mood and affect Neuro: normal speech, no gross focal deficits   Test Results Reviewed Lab Results  Component Value Date   NA 134 (L) 05/08/2021   K 4.4 05/08/2021   CL 93 (L) 05/08/2021   CO2 26 05/08/2021   BUN 61 (H) 05/08/2021   CREATININE 6.75 (H) 05/08/2021   CALCIUM 8.3 (L) 05/08/2021   ALBUMIN 2.1 (L) 05/08/2021   PHOS 3.7 04/14/2021    I have reviewed relevant outside healthcare records

## 2021-05-08 NOTE — Progress Notes (Signed)
New Admission Note:   Arrival Method: Stretcher Mental Orientation: alertx4 Telemetry: box 19 Assessment: Completed Skin: see flowsheet IV: NSL Pain: controlled  Tubes: PD CATH Safety Measures: Safety Fall Prevention Plan has been discussed Admission: Completed [facility] Orientation: Patient has been orientated to the room, unit and staff.  Family: none at bedside  Orders have been reviewed and implemented. Will continue to monitor the patient. Call light has been placed within reach and bed alarm has been activated.   Rockie Neighbours BSN, RN Phone number: [phone]

## 2021-05-08 NOTE — Progress Notes (Signed)
Patient ID: Maxwell Harmon, male   DOB: August 05, 1956, 65 y.o.   MRN: [id number] I came by the bedside to examine the patient's left foot.  I did take down his dressings to see the large eschar that is on the lateral aspect of his foot and calcaneus area.  There is no redness around the foot and the swelling is down considerably from what I saw from pictures.  There is no drainage coming from the wound and no purulence.  I did review the x-rays and there is no gross evidence of osteomyelitis.  I did review the chart and I am familiar with the patient in terms of the vascular interventions performed recently to try to improve blood flow to his left foot.  He is also a patient of my partner Dr. Sharol Given who will be back in town tomorrow.  He was actually supposed to see Dr. Sharol Given as an outpatient tomorrow afternoon.  I will let him know that Maxwell Harmon is an inpatient at [facility].  I did place a new dry dressing around the foot with significant padding and instructed him to float his heel at all times with a pillow underneath his calf to decrease the pressure on that wound.  He is scheduled for dialysis I believe today.  The patient does report that he had significant pain with the compressive garment that he was trying to use on his foot and ankle and calf and removing that and then putting her back on worsen his pain.  Today his pain is better controlled.

## 2021-05-08 NOTE — Consult Note (Signed)
WOC Nurse Consult Note: Reason for Consult: Painful ischemic ulcer to left medial heel and midfoot.  Initially reported this started as a cut.  Cellulitis present with no indication of osteomyelitis.  Seen by Dr Sharol Given outpatient.  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 Is waiting on kidney transplant.   Wound type: ischemic ulcer, chronic nonhealing.  Pressure Injury POA: NA Measurement: 4.5 cm x 6 cm eschar with separated edges Wound bed:100% eschar Drainage (amount, consistency, odor) Serosanguinous  Musty odor.  Periwound: erythema, fluctuance and pain.  Dressing procedure/placement/frequency:Cleanse left foot wound with NS and pat dry.  Cover with Aquacel Ag sheet and dry gauze/kerlix and tape.  Change Mon/Wed/Fri.  Will not follow at this time.  Please re-consult if needed.  Domenic Moras MSN, RN, FNP-BC CWON Wound, Ostomy, Continence Nurse Pager [phone]

## 2021-05-08 NOTE — Plan of Care (Signed)
  Problem: Fluid Volume: Goal: Compliance with measures to maintain balanced fluid volume will improve Outcome: Progressing   Problem: Nutritional: Goal: Ability to make healthy dietary choices will improve Outcome: Progressing   Problem: Clinical Measurements: Goal: Complications related to the disease process, condition or treatment will be avoided or minimized Outcome: Progressing

## 2021-05-09 ENCOUNTER — Inpatient Hospital Stay (HOSPITAL_COMMUNITY): Payer: Managed Care, Other (non HMO)

## 2021-05-09 ENCOUNTER — Ambulatory Visit: Payer: Managed Care, Other (non HMO) | Admitting: Orthopedic Surgery

## 2021-05-09 DIAGNOSIS — L03116 Cellulitis of left lower limb: Secondary | ICD-10-CM | POA: Diagnosis not present

## 2021-05-09 DIAGNOSIS — I739 Peripheral vascular disease, unspecified: Secondary | ICD-10-CM | POA: Diagnosis not present

## 2021-05-09 DIAGNOSIS — E11621 Type 2 diabetes mellitus with foot ulcer: Secondary | ICD-10-CM | POA: Diagnosis not present

## 2021-05-09 DIAGNOSIS — N189 Chronic kidney disease, unspecified: Secondary | ICD-10-CM

## 2021-05-09 DIAGNOSIS — L97429 Non-pressure chronic ulcer of left heel and midfoot with unspecified severity: Secondary | ICD-10-CM | POA: Diagnosis not present

## 2021-05-09 DIAGNOSIS — Z992 Dependence on renal dialysis: Secondary | ICD-10-CM

## 2021-05-09 DIAGNOSIS — E1122 Type 2 diabetes mellitus with diabetic chronic kidney disease: Secondary | ICD-10-CM

## 2021-05-09 DIAGNOSIS — N186 End stage renal disease: Secondary | ICD-10-CM

## 2021-05-09 DIAGNOSIS — Z794 Long term (current) use of insulin: Secondary | ICD-10-CM

## 2021-05-09 DIAGNOSIS — L97509 Non-pressure chronic ulcer of other part of unspecified foot with unspecified severity: Secondary | ICD-10-CM

## 2021-05-09 DIAGNOSIS — E119 Type 2 diabetes mellitus without complications: Secondary | ICD-10-CM

## 2021-05-09 DIAGNOSIS — I96 Gangrene, not elsewhere classified: Secondary | ICD-10-CM

## 2021-05-09 LAB — GLUCOSE, CAPILLARY
Glucose-Capillary: 100 mg/dL — ABNORMAL HIGH (ref 70–99)
Glucose-Capillary: 120 mg/dL — ABNORMAL HIGH (ref 70–99)
Glucose-Capillary: 138 mg/dL — ABNORMAL HIGH (ref 70–99)
Glucose-Capillary: 176 mg/dL — ABNORMAL HIGH (ref 70–99)
Glucose-Capillary: 192 mg/dL — ABNORMAL HIGH (ref 70–99)
Glucose-Capillary: 99 mg/dL (ref 70–99)

## 2021-05-09 NOTE — Consult Note (Signed)
ORTHOPAEDIC CONSULTATION  REQUESTING PHYSICIAN: Geradine Girt, DO  Chief Complaint: Progressive ischemic left foot pain  HPI: Maxwell Harmon is a 65 y.o. male who presents with progressive gangrenous ischemic changes of the left heel.  Patient is 3 weeks status post endovascular revascularization to the left lower extremity with Dr. Stanford Breed.  Patient was at home he states he had increasing pain he could not wear his socks and presents at this time with progressive gangrenous changes to the left heel.  Past Medical History:  Diagnosis Date   Diabetes mellitus without complication ([place])    History of bleeding ulcers    History of TIAs    Hypertension    Low kidney function    Past Surgical History:  Procedure Laterality Date   ABDOMINAL AORTOGRAM W/LOWER EXTREMITY Bilateral 04/14/2021   Procedure: ABDOMINAL AORTOGRAM W/LOWER EXTREMITY;  Surgeon: Elam Dutch, MD;  Location: [facility];  Service: Vascular;  Laterality: Bilateral;   CATARACT EXTRACTION W/PHACO Left 02/15/2014   Procedure: CATARACT EXTRACTION PHACO AND INTRAOCULAR LENS PLACEMENT ([place]);  Surgeon: Tonny Branch, MD;  Location: [facility];  Service: Ophthalmology;  Laterality: Left;  CDE 10.84   CATARACT EXTRACTION W/PHACO Right 12/14/2013   Procedure: CATARACT EXTRACTION PHACO AND INTRAOCULAR LENS PLACEMENT ([facility]);  Surgeon: Tonny Branch, MD;  Location: [facility];  Service: Ophthalmology;  Laterality: Right;  CDE:  16.30   CHOLECYSTECTOMY  02/2013   EYE SURGERY     IR FLUORO GUIDE CV LINE RIGHT  01/05/2019   IR US GUIDE VASC ACCESS RIGHT  01/05/2019   LAPAROSCOPIC NEPHRECTOMY Right 01/08/2019   Procedure: LAPAROSCOPIC RADICAL NEPHRECTOMY;  Surgeon: Raynelle Bring, MD;  Location: [facility];  Service: Urology;  Laterality: Right;   LEFT HEART CATH AND CORS/GRAFTS ANGIOGRAPHY N/A 04/14/2021   Procedure: LEFT HEART CATH AND CORS/GRAFTS ANGIOGRAPHY;  Surgeon: Jolaine Artist, MD;  Location: [facility];  Service:  Cardiovascular;  Laterality: N/A;   PENILE PROSTHESIS IMPLANT     PERIPHERAL VASCULAR INTERVENTION Left 04/19/2021   Procedure: PERIPHERAL VASCULAR INTERVENTION;  Surgeon: Cherre Robins, MD;  Location: [facility];  Service: Cardiovascular;  Laterality: Left;   TOE AMPUTATION Right 2013   [facility]   Social History   Socioeconomic History   Marital status: Married    Spouse name: Not on file   Number of children: Not on file   Years of education: Not on file   Highest education level: Not on file  Occupational History   Not on file  Tobacco Use   Smoking status: Never   Smokeless tobacco: Never  Vaping Use   Vaping Use: Never used  Substance and Sexual Activity   Alcohol use: Yes    Comment: social drink    Drug use: No   Sexual activity: Not on file  Other Topics Concern   Not on file  Social History Narrative   Not on file   Social Determinants of Health   Financial Resource Strain: Low Risk    Difficulty of Paying Living Expenses: Not very hard  Food Insecurity: No Food Insecurity   Worried About Running Out of Food in the Last Year: Never true   Ran Out of Food in the Last Year: Never true  Transportation Needs: No Transportation Needs   Lack of Transportation (Medical): No   Lack of Transportation (Non-Medical): No  Physical Activity: Not on file  Stress: Not on file  Social Connections: Not on  file   Family History  Problem Relation Age of Onset   Stroke Mother    Stroke Father    Cancer Father    Cancer Brother    - negative except otherwise stated in the family history section No Known Allergies Prior to Admission medications   Medication Sig Start Date End Date Taking? Authorizing Provider  aspirin 81 MG EC tablet Take 1 tablet (81 mg total) by mouth daily. 04/20/21 07/19/21 Yes Dessa Phi, DO  atorvastatin (LIPITOR) 40 MG tablet Take 2 tablets (80 mg total) by mouth daily. Patient taking differently: Take 40 mg by mouth  daily. 04/20/21 07/19/21 Yes Dessa Phi, DO  AURYXIA 1 GM 210 MG(Fe) tablet Take 630 mg by mouth 3 (three) times daily. 03/21/21  Yes [provider]  blood glucose meter kit and supplies KIT Dispense based on patient and insurance preference. Use up to four times daily as directed. (FOR ICD-9 250.00, 250.01). Patient taking differently: Inject 1 each into the skin See admin instructions. Dispense based on patient and insurance preference. Use up to four times daily as directed. (FOR ICD-9 250.00, 250.01). 11/08/15  Yes Nida, Marella Chimes, MD  Blood Glucose Monitoring Suppl (BAYER CONTOUR MONITOR) w/Device KIT 1 each by Does not apply route 4 (four) times daily. Test 4 x daily 11/08/15  Yes Nida, Marella Chimes, MD  calcitRIOL (ROCALTROL) 0.25 MCG capsule Take 0.25 mcg by mouth daily. 02/06/21  Yes [provider]  cinacalcet (SENSIPAR) 30 MG tablet Take 30 mg by mouth once a week. Take on Mondays 03/28/21  Yes [provider]  clonazePAM (KLONOPIN) 0.5 MG tablet Take 0.5 mg by mouth daily as needed for anxiety.   Yes [provider]  clopidogrel (PLAVIX) 75 MG tablet Take 1 tablet by mouth daily. 02/06/21  Yes [provider]  collagenase (SANTYL) ointment Apply topically daily. 04/21/21  Yes Dessa Phi, DO  fluorouracil (EFUDEX) 5 % cream Apply topically 2 (two) times daily. 03/06/21  Yes [provider]  glucose blood (BAYER CONTOUR TEST) test strip Use as instructed 4 x daily Patient taking differently: 1 each by Other route See admin instructions. Use as instructed 4 x daily 11/08/15  Yes Nida, Marella Chimes, MD  Insulin Glargine (BASAGLAR KWIKPEN) 100 UNIT/ML Inject 5 Units into the skin at bedtime. 04/20/21 07/19/21 Yes Dessa Phi, DO  Insulin Pen Needle (B-D ULTRAFINE III SHORT PEN) 31G X 8 MM MISC 1 each by Does not apply route as directed. 09/02/15  Yes Cassandria Anger, MD  Lancets MISC 1 each by Does not apply route 4 (four) times  daily. 11/08/15  Yes Nida, Marella Chimes, MD  mupirocin ointment (BACTROBAN) 2 % Apply topically daily. Patient taking differently: Apply 1 application topically daily. 04/21/21  Yes Dessa Phi, DO  nitroGLYCERIN (NITRODUR - DOSED IN MG/24 HR) 0.2 mg/hr patch Place 1 patch (0.2 mg total) onto the skin daily. 05/02/21  Yes Newt Minion, MD  pentoxifylline (TRENTAL) 400 MG CR tablet Take 1 tablet (400 mg total) by mouth 3 (three) times daily with meals. 05/02/21  Yes Newt Minion, MD  tamsulosin (FLOMAX) 0.4 MG CAPS capsule Take 0.4 mg by mouth at bedtime. 03/29/21  Yes [provider]  escitalopram (LEXAPRO) 10 MG tablet Take 1 tablet (10 mg total) by mouth daily. Patient not taking: No sig reported 04/21/21 07/20/21  Dessa Phi, DO   DG Foot Complete Left  Result Date: 05/07/2021 CLINICAL DATA:  Posterior and lateral left foot pain  and wounds for 3 months. History of diabetes. EXAM: LEFT FOOT - COMPLETE 3+ VIEW COMPARISON:  04/10/2021 FINDINGS: Extensive vascular calcifications in the soft tissues. Degenerative changes in the interphalangeal joints and intertarsal joints. Plantar calcaneal spur. No evidence of acute fracture or dislocation. No focal bone lesion or bone erosion. No cortical erosion or sclerosis to suggest osteomyelitis. No radiopaque soft tissue foreign bodies or soft tissue gas identified. IMPRESSION: No acute bony abnormalities. No radiographic evidence of osteomyelitis. Extensive vascular calcifications. Electronically Signed   By: Lucienne Capers M.D.   On: 05/07/2021 19:19   VAS [place] UPPER EXTREMITY ARTERIAL DUPLEX  Result Date: 05/08/2021  UPPER EXTREMITY DUPLEX STUDY Patient Name:  Maxwell Harmon  Date of Exam:   05/08/2021 Medical Rec #: [id number]        Accession #:    [id number] Date of Birth: [birth year]        Patient Gender: M Patient Age:   8Y Exam Location:  [facility] Procedure:      VAS [place] UPPER EXTREMITY ARTERIAL DUPLEX Referring Phys: [id number]  Sheppard Coil B MELVIN --------------------------------------------------------------------------------  Indications: Intermittent pallor, poor capillary refill, and weak radial pulse              in the left upper extremity. History:     Patient has a history of Left upper extremity AV fistula (basilic).  Risk Factors:  Hypertension, hyperlipidemia, Diabetes, coronary artery disease. Other Factors: PAD with history of left LE ischemia, ESRD Comparison Study: No prior study on file Performing Technologist: [name] RDMS,RVT  Examination Guidelines: A complete evaluation includes B-mode imaging, spectral Doppler, color Doppler, and power Doppler as needed of all accessible portions of each vessel. Bilateral testing is considered an integral part of a complete examination. Limited examinations for reoccurring indications may be performed as noted.   Summary:  Left: 50-74% stenosis noted at the anastomosis. Radial artery       appears patent with reversal of flow. *See table(s) above for measurements and observations. Electronically signed by Harold Barban MD on 05/08/2021 at 2:28:39 PM.    Final    - pertinent xrays, CT, MRI studies were reviewed and independently interpreted  Positive ROS: All other systems have been reviewed and were otherwise negative with the exception of those mentioned in the HPI and as above.  Physical Exam: General: Alert, no acute distress Psychiatric: Patient is competent for consent with normal mood and affect Lymphatic: No axillary or cervical lymphadenopathy Cardiovascular: No pedal edema Respiratory: No cyanosis, no use of accessory musculature GI: No organomegaly, abdomen is soft and non-tender    Images:  _0 @  Labs:  Lab Results  Component Value Date   HGBA1C 8.2 (H) 04/10/2021   HGBA1C 6.9 (H) 01/02/2019   HGBA1C 7.5 11/08/2015   REPTSTATUS PENDING 05/07/2021   CULT  05/07/2021    NO GROWTH < 12 HOURS Performed at [facility], [place] [address], [place], [place] [address]     Lab Results  Component Value Date   ALBUMIN 2.1 (L) 05/08/2021   ALBUMIN 2.5 (L) 05/07/2021   ALBUMIN 2.2 (L) 04/14/2021     CBC EXTENDED Latest Ref Rng & Units 05/08/2021 05/08/2021 05/07/2021  WBC 4.0 - 10.5 K/uL 11.3(H) 11.7(H) 13.4(H)  RBC 4.22 - 5.81 MIL/uL 2.65(L) 2.50(L) 2.70(L)  HGB 13.0 - 17.0 g/dL 8.2(L) 7.8(L) 8.5(L)  HCT 39.0 - 52.0 % 26.4(L) 25.0(L) 27.3(L)  PLT 150 - 400 K/uL 149(L) 149(L) 174  NEUTROABS 1.7 - 7.7  K/uL - - 11.1(H)  LYMPHSABS 0.7 - 4.0 K/uL - - 1.3    Neurologic: Patient does not have protective sensation bilateral lower extremities.   MUSCULOSKELETAL:   Skin: Examination previously the patient's area of ischemia was superficial about 3 cm in diameter and the heel had good turgor.  At this time patient has progressive ischemic changes the soft tissue was hard and indurated with dry full-thickness gangrenous changes with an area of gangrene approximately 6 cm in diameter.  Hemoglobin 8.2 white blood cell count 11.3 albumin 2.1 and a hemoglobin A1c of 8.2.  Radiographs do not show any destructive bony changes but do show extensive arterial calcification in the foot.  Assessment: Assessment: Progressive ischemic changes left foot status post endovascular revascularization with progressive full-thickness dry gangrenous changes to the left heel.  Plan: I will reconsult vascular surgery.  Discussed with the patient that a below the knee amputation is a reasonable option in light of his severe peripheral vascular disease to the left lower extremity with worsening ischemic changes to the left heel.  Thank you for the consult and the opportunity to see [name], [place] [phone] 7:53 AM

## 2021-05-09 NOTE — Consult Note (Signed)
Patient is a 65 year old male who presented with worsening wound in his left foot.  He underwent left lower extremity arteriogram and angioplasty lithotripsy of his left popliteal artery by Dr. Stanford Breed on April 19, 2021.  Over the last few days he has had worsening of pain in the right foot.  He was admitted over the weekend through the ER.  He has not had any fever or chills.  He is on Plavix aspirin and a statin.  Other risk factor/medical problems include diabetes and renal failure.  He also has hypertension and coronary artery disease.  These are currently controlled.  Past Medical History:  Diagnosis Date   Diabetes mellitus without complication ([place])    History of bleeding ulcers    History of TIAs    Hypertension    Low kidney function    Past Surgical History:  Procedure Laterality Date   ABDOMINAL AORTOGRAM W/LOWER EXTREMITY Bilateral 04/14/2021   Procedure: ABDOMINAL AORTOGRAM W/LOWER EXTREMITY;  Surgeon: Elam Dutch, MD;  Location: [facility];  Service: Vascular;  Laterality: Bilateral;   CATARACT EXTRACTION W/PHACO Left 02/15/2014   Procedure: CATARACT EXTRACTION PHACO AND INTRAOCULAR LENS PLACEMENT ([place]);  Surgeon: Tonny Branch, MD;  Location: [facility];  Service: Ophthalmology;  Laterality: Left;  CDE 10.84   CATARACT EXTRACTION W/PHACO Right 12/14/2013   Procedure: CATARACT EXTRACTION PHACO AND INTRAOCULAR LENS PLACEMENT ([facility]);  Surgeon: Tonny Branch, MD;  Location: [facility];  Service: Ophthalmology;  Laterality: Right;  CDE:  16.30   CHOLECYSTECTOMY  02/2013   EYE SURGERY     IR FLUORO GUIDE CV LINE RIGHT  01/05/2019   IR US GUIDE VASC ACCESS RIGHT  01/05/2019   LAPAROSCOPIC NEPHRECTOMY Right 01/08/2019   Procedure: LAPAROSCOPIC RADICAL NEPHRECTOMY;  Surgeon: Raynelle Bring, MD;  [facility];  Service: Urology;  Laterality: Right;   LEFT HEART CATH AND CORS/GRAFTS ANGIOGRAPHY N/A 04/14/2021   Procedure: LEFT HEART CATH AND CORS/GRAFTS ANGIOGRAPHY;  Surgeon: Jolaine Artist,  MD;  Location: [facility];  Service: Cardiovascular;  Laterality: N/A;   PENILE PROSTHESIS IMPLANT     PERIPHERAL VASCULAR INTERVENTION Left 04/19/2021   Procedure: PERIPHERAL VASCULAR INTERVENTION;  Surgeon: Cherre Robins, MD;  Location: [facility];  Service: Cardiovascular;  Laterality: Left;   TOE AMPUTATION Right 2013   [facility]    No current facility-administered medications on file prior to encounter.   Current Outpatient Medications on File Prior to Encounter  Medication Sig Dispense Refill   aspirin 81 MG EC tablet Take 1 tablet (81 mg total) by mouth daily. 30 tablet 2   atorvastatin (LIPITOR) 40 MG tablet Take 2 tablets (80 mg total) by mouth daily. (Patient taking differently: Take 40 mg by mouth daily.) 60 tablet 2   AURYXIA 1 GM 210 MG(Fe) tablet Take 630 mg by mouth 3 (three) times daily.     blood glucose meter kit and supplies KIT Dispense based on patient and insurance preference. Use up to four times daily as directed. (FOR ICD-9 250.00, 250.01). (Patient taking differently: Inject 1 each into the skin See admin instructions. Dispense based on patient and insurance preference. Use up to four times daily as directed. (FOR ICD-9 250.00, 250.01).) 1 each 0   Blood Glucose Monitoring Suppl (BAYER CONTOUR MONITOR) w/Device KIT 1 each by Does not apply route 4 (four) times daily. Test 4 x daily 1 kit 0   calcitRIOL (ROCALTROL) 0.25 MCG capsule Take 0.25 mcg by mouth daily.  cinacalcet (SENSIPAR) 30 MG tablet Take 30 mg by mouth once a week. Take on Mondays     clonazePAM (KLONOPIN) 0.5 MG tablet Take 0.5 mg by mouth daily as needed for anxiety.     clopidogrel (PLAVIX) 75 MG tablet Take 1 tablet by mouth daily.     collagenase (SANTYL) ointment Apply topically daily. 30 g 0   fluorouracil (EFUDEX) 5 % cream Apply topically 2 (two) times daily.     glucose blood (BAYER CONTOUR TEST) test strip Use as instructed 4 x daily (Patient taking  differently: 1 each by Other route See admin instructions. Use as instructed 4 x daily) 150 each 5   Insulin Glargine (BASAGLAR KWIKPEN) 100 UNIT/ML Inject 5 Units into the skin at bedtime. 3 mL 1   Insulin Pen Needle (B-D ULTRAFINE III SHORT PEN) 31G X 8 MM MISC 1 each by Does not apply route as directed. 100 each 3   Lancets MISC 1 each by Does not apply route 4 (four) times daily. 150 each 5   mupirocin ointment (BACTROBAN) 2 % Apply topically daily. (Patient taking differently: Apply 1 application topically daily.) 22 g 0   nitroGLYCERIN (NITRODUR - DOSED IN MG/24 HR) 0.2 mg/hr patch Place 1 patch (0.2 mg total) onto the skin daily. 30 patch 12   pentoxifylline (TRENTAL) 400 MG CR tablet Take 1 tablet (400 mg total) by mouth 3 (three) times daily with meals. 90 tablet 3   tamsulosin (FLOMAX) 0.4 MG CAPS capsule Take 0.4 mg by mouth at bedtime.     escitalopram (LEXAPRO) 10 MG tablet Take 1 tablet (10 mg total) by mouth daily. (Patient not taking: No sig reported) 30 tablet 2   Review of systems: He has no chest pain.  He has no shortness of breath.  Physical exam:  Vitals:   05/08/21 1849 05/08/21 2015 05/09/21 0500 05/09/21 0505  BP: 114/60 116/69  98/68  Pulse: 99 96  86  Resp: _0 Temp: 98 F (36.7 C) 98.1 F (36.7 C)  98.5 F (36.9 C)  TempSrc: Oral Oral    SpO2: 98% 90%  96%  Weight:  88.9 kg 89.9 kg     Extremities: 1+ femoral pulses bilaterally absent popliteal and pedal pulses bilaterally.  No open wound right foot.  Left foot has a large black eschar measuring 7 x 4 cm encompassing the left heel and lateral aspect of the foot.  This is similar in appearance to the image that was obtained for his chart at the time of admission.  Assessment: Progressive gangrenous changes left foot and I do not believe that it is salvageable at this point.  Plan: We will obtain duplex ultrasound of the left leg to see if the popliteal intervention is still patent for prognosis of  below-knee amputation.  His leg is warm below the knee and I think he does have potential to heal this which would be better for rehabilitation.  I will discuss with Dr. Stanford Breed and Dr. Sharol Given.  Ruta Hinds, MD [facility] Office: [phone]

## 2021-05-09 NOTE — Plan of Care (Signed)
  Problem: Clinical Measurements: Goal: Respiratory complications will improve Outcome: Progressing   Problem: Activity: Goal: Risk for activity intolerance will decrease Outcome: Progressing Note: Pt was able to mobilize to the chair with one assist and front wheel walker. Tolerated well.   Problem: Nutrition: Goal: Adequate nutrition will be maintained Outcome: Progressing   Problem: Pain Managment: Goal: General experience of comfort will improve Outcome: Progressing Note: Current pain regimen is working to keep patients pain at tolerable level.   Problem: Safety: Goal: Ability to remain free from injury will improve Outcome: Progressing

## 2021-05-09 NOTE — Progress Notes (Signed)
Progress Note    Maxwell Harmon  HER:[id number] DOB: [birth year]  DOA: 05/07/2021 PCP: [name], DO    Brief Narrative:   Medical records reviewed and are as summarized below:  Maxwell Harmon is an 65 y.o. male with medical history significant of CHF, ESRD, hypertension, CAD, PAD history of limb ischemia, hyperlipidemia, diabetes, TIA, anxiety who presents with worsening skin changes and pain of left foot.  Dr. Sharol Given to see in the AM.   Assessment/Plan:   Principal Problem:   Cellulitis Active Problems:   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[place])   Hyperlipidemia   Essential hypertension, benign   PAD (peripheral artery disease) (HCC)   ESRD on hemodialysis ([place])   Ischemic ulcer diabetic foot ([place])   Ischemic ulcer of left heel ([place])   Cellulitis Diabetic Foot > Patient with increased pain, discharge, erythema, edema at his left foot around the site of chronic ischemic ulcer that had been improving after revascularization on 6/15. > Foot x-ray without evidence of osteomyelitis.  Leukocytosis to 13.4. - Dr. Darolyn Rua consult: Will get duplex today for prognosis for BKA.  Dr Sharol Given will schedule BKA this week. - Continue abx - Trend fever curve and WBC   PAD History of TIA Hyperlipidemia > History of ischemic ulcer that has been healing since revascularization on 6/15 as above. - Continue with home atorvastatin, aspirin, Plavix, pentoxifylline   ESRD on HD >nephrology consult   Macrocytic anemia > Hemoglobin 8.5 near his baseline of 9.  MCV 101.  Likely component of anemia of chronic kidney disease. - Trend CBC   Diabetes - SSI   Anxiety - Continue home as needed clonazepam    Family Communication/Anticipated D/C date and plan/Code Status   DVT prophylaxis: heparin Code Status: Full Code.  Disposition Plan: Status is: inpt  The patient will require care spanning > 2 midnights and should be moved to inpatient  because: Inpatient level of care appropriate due to severity of illness  Dispo: The patient is from: Home              Anticipated d/c is to: Home              Patient currently is not medically stable to d/c.   Difficult to place patient No         Medical Consultants:   Ortho/vascular  Subjective:   sleeping  Objective:    Vitals:   05/08/21 2015 05/09/21 0500 05/09/21 0505 05/09/21 1018  BP: 116/69  98/68 101/70  Pulse: 96  86 88  Resp: 18  17 18   Temp: 98.1 F (36.7 C)  98.5 F (36.9 C) 98.6 F (37 C)  TempSrc: Oral   Oral  SpO2: 90%  96% 98%  Weight: 88.9 kg 89.9 kg      Intake/Output Summary (Last 24 hours) at 05/09/2021 1315 Last data filed at 05/09/2021 0900 Gross per 24 hour  Intake 620.57 ml  Output 2000 ml  Net -1379.43 ml   Filed Weights   05/08/21 1820 05/08/21 2015 05/09/21 0500  Weight: 88.9 kg 88.9 kg 89.9 kg    Exam:   General: Appearance:     Overweight male in no acute distress     Lungs:    respirations unlabored  Heart:    Normal heart rate.    MS:   Right foot wrapped           Data Reviewed:   I  have personally reviewed following labs and imaging studies:  Labs: Labs show the following:   Basic Metabolic Panel: Recent Labs  Lab 05/07/21 1709 05/08/21 0120  NA 134* 134*  K 4.1 4.4  CL 92* 93*  CO2 28 26  GLUCOSE 185* 221*  BUN 57* 61*  CREATININE 6.26* 6.75*  CALCIUM 8.9 8.3*   GFR Estimated Creatinine Clearance: 11.8 mL/min (A) (by C-G formula based on SCr of 6.75 mg/dL (H)). Liver Function Tests: Recent Labs  Lab 05/07/21 1709 05/08/21 0120  AST 29 25  ALT 24 19  ALKPHOS 121 98  BILITOT 1.3* 1.1  PROT 6.8 5.8*  ALBUMIN 2.5* 2.1*   No results for input(s): LIPASE, AMYLASE in the last 168 hours. No results for input(s): AMMONIA in the last 168 hours. Coagulation profile Recent Labs  Lab 05/07/21 1725  INR 1.3*    CBC: Recent Labs  Lab 05/07/21 1709 05/08/21 0120 05/08/21 1232  WBC  13.4* 11.7* 11.3*  NEUTROABS 11.1*  --   --   HGB 8.5* 7.8* 8.2*  HCT 27.3* 25.0* 26.4*  MCV 101.1* 100.0 99.6  PLT 174 149* 149*   Cardiac Enzymes: No results for input(s): CKTOTAL, CKMB, CKMBINDEX, TROPONINI in the last 168 hours. BNP (last 3 results) No results for input(s): PROBNP in the last 8760 hours. CBG: Recent Labs  Lab 05/08/21 2016 05/09/21 0013 05/09/21 0505 05/09/21 0718 05/09/21 1134  GLUCAP 100* 120* 100* 99 138*   D-Dimer: No results for input(s): DDIMER in the last 72 hours. Hgb A1c: No results for input(s): HGBA1C in the last 72 hours. Lipid Profile: No results for input(s): CHOL, HDL, LDLCALC, TRIG, CHOLHDL, LDLDIRECT in the last 72 hours. Thyroid function studies: No results for input(s): TSH, T4TOTAL, T3FREE, THYROIDAB in the last 72 hours.  Invalid input(s): FREET3 Anemia work up: No results for input(s): VITAMINB12, FOLATE, FERRITIN, TIBC, IRON, RETICCTPCT in the last 72 hours. Sepsis Labs: Recent Labs  Lab 05/07/21 1709 05/07/21 2033 05/08/21 0120 05/08/21 1232  WBC 13.4*  --  11.7* 11.3*  LATICACIDVEN 1.6 1.4  --   --     Microbiology Recent Results (from the past 240 hour(s))  Blood Culture (routine x 2)     Status: None (Preliminary result)   Collection Time: 05/07/21  5:39 PM   Specimen: BLOOD RIGHT WRIST  Result Value Ref Range Status   Specimen Description BLOOD RIGHT WRIST  Final   Special Requests   Final    BOTTLES DRAWN AEROBIC AND ANAEROBIC Blood Culture results may not be optimal due to an inadequate volume of blood received in culture bottles   Culture   Final    NO GROWTH 2 DAYS Performed at [facility], [place] [address] [place], [place] [address]    Report Status PENDING  Incomplete  Blood Culture (routine x 2)     Status: None (Preliminary result)   Collection Time: 05/07/21  8:33 PM   Specimen: BLOOD  Result Value Ref Range Status   Specimen Description BLOOD RIGHT ANTECUBITAL  Final   Special Requests    Final    BOTTLES DRAWN AEROBIC AND ANAEROBIC Blood Culture results may not be optimal due to an inadequate volume of blood received in culture bottles   Culture   Final    NO GROWTH 2 DAYS Performed at [facility], [place] [address], [place],  [address]    Report Status PENDING  Incomplete  Resp Panel by RT-PCR (Flu A&B, Covid) Nasopharyngeal  Swab     Status: None   Collection Time: 05/07/21  9:35 PM   Specimen: Nasopharyngeal Swab; Nasopharyngeal(NP) swabs in vial transport medium  Result Value Ref Range Status   SARS Coronavirus 2 by RT PCR NEGATIVE NEGATIVE Final    Comment: (NOTE) SARS-CoV-2 target nucleic acids are NOT DETECTED.  The SARS-CoV-2 RNA is generally detectable in upper respiratory specimens during the acute phase of infection. The lowest concentration of SARS-CoV-2 viral copies this assay can detect is 138 copies/mL. A negative result does not preclude SARS-Cov-2 infection and should not be used as the sole basis for treatment or other patient management decisions. A negative result may occur with  improper specimen collection/handling, submission of specimen other than nasopharyngeal swab, presence of viral mutation(s) within the areas targeted by this assay, and inadequate number of viral copies(<138 copies/mL). A negative result must be combined with clinical observations, patient history, and epidemiological information. The expected result is Negative.  Fact Sheet for Patients:  EntrepreneurPulse.com.au  Fact Sheet for Healthcare Providers:  IncredibleEmployment.be  This test is no t yet approved or cleared by the [place] FDA and  has been authorized for detection and/or diagnosis of SARS-CoV-2 by FDA under an Emergency Use Authorization (EUA). This EUA will remain  in effect (meaning this test can be used) for the duration of the COVID-19 declaration under Section 564(b)(1) of the Act, 21 U.S.C.section  360bbb-3(b)(1), unless the authorization is terminated  or revoked sooner.       Influenza A by PCR NEGATIVE NEGATIVE Final   Influenza B by PCR NEGATIVE NEGATIVE Final    Comment: (NOTE) The Xpert Xpress SARS-CoV-2/FLU/RSV plus assay is intended as an aid in the diagnosis of influenza from Nasopharyngeal swab specimens and should not be used as a sole basis for treatment. Nasal washings and aspirates are unacceptable for Xpert Xpress SARS-CoV-2/FLU/RSV testing.  Fact Sheet for Patients: EntrepreneurPulse.com.au  Fact Sheet for Healthcare Providers: IncredibleEmployment.be  This test is not yet approved or cleared by the [place] FDA and has been authorized for detection and/or diagnosis of SARS-CoV-2 by FDA under an Emergency Use Authorization (EUA). This EUA will remain in effect (meaning this test can be used) for the duration of the COVID-19 declaration under Section 564(b)(1) of the Act, 21 U.S.C. section 360bbb-3(b)(1), unless the authorization is terminated or revoked.  Performed at [facility], [place] [address], [place], [place] [address]     Procedures and diagnostic studies:  DG Foot Complete Left  Result Date: 05/07/2021 CLINICAL DATA:  Posterior and lateral left foot pain and wounds for 3 months. History of diabetes. EXAM: LEFT FOOT - COMPLETE 3+ VIEW COMPARISON:  04/10/2021 FINDINGS: Extensive vascular calcifications in the soft tissues. Degenerative changes in the interphalangeal joints and intertarsal joints. Plantar calcaneal spur. No evidence of acute fracture or dislocation. No focal bone lesion or bone erosion. No cortical erosion or sclerosis to suggest osteomyelitis. No radiopaque soft tissue foreign bodies or soft tissue gas identified. IMPRESSION: No acute bony abnormalities. No radiographic evidence of osteomyelitis. Extensive vascular calcifications. Electronically Signed   By: Lucienne Capers M.D.   On:  05/07/2021 19:19   VAS [place] UPPER EXTREMITY ARTERIAL DUPLEX  Result Date: 05/08/2021  UPPER EXTREMITY DUPLEX STUDY Patient Name:  Maxwell Harmon  Date of Exam:   05/08/2021 Medical Rec #: [id number]        Accession #:    [id number] Date of Birth: [birth year]        Patient  Gender: M Patient Age:   32Y Exam Location:  [facility] Procedure:      VAS [place] UPPER EXTREMITY ARTERIAL DUPLEX Referring Phys: [id number] Shepp[place] Coil B MELVIN --------------------------------------------------------------------------------  Indications: Intermittent pallor, poor capillary refill, and weak radial pulse              in the left upper extremity. History:     Patient has a history of Left upper extremity AV fistula (basilic).  Risk Factors:  Hypertension, hyperlipidemia, Diabetes, coronary artery disease. Other Factors: PAD with history of left LE ischemia, ESRD Comparison Study: No prior study on file Performing Technologist: [name] RDMS,RVT  Examination Guidelines: A complete evaluation includes B-mode imaging, spectral Doppler, color Doppler, and power Doppler as needed of all accessible portions of each vessel. Bilateral testing is considered an integral part of a complete examination. Limited examinations for reoccurring indications may be performed as noted.   Summary:  Left: 50-74% stenosis noted at the anastomosis. Radial artery       appears patent with reversal of flow. *See table(s) above for measurements and observations. Electronically signed by Harold Barban MD on 05/08/2021 at 2:28:39 PM.    Final     Medications:    aspirin EC  81 mg Oral Daily   atorvastatin  80 mg Oral Daily   calcitRIOL  0.25 mcg Oral BID   Chlorhexidine Gluconate Cloth  6 each Topical Q0600   clopidogrel  75 mg Oral Daily   [START ON 05/10/2021] darbepoetin ([name]) injection - DIALYSIS  150 mcg Intravenous Q Wed-HD   escitalopram  10 mg Oral Daily   ferric citrate  630 mg Oral TID   heparin  5,000 Units Subcutaneous Q8H    insulin aspart  0-6 Units Subcutaneous TID WC   pentoxifylline  400 mg Oral TID WC   sodium chloride flush  3 mL Intravenous Q12H   tamsulosin  0.4 mg Oral QHS   Continuous Infusions:  cefTRIAXone (ROCEPHIN)  IV 2 g (05/09/21 0019)   metronidazole 500 mg (05/09/21 0521)   vancomycin 750 mg (05/08/21 1645)     LOS: 1 day   Geradine Girt  [facility]   How to contact the [facility] Attending or Consulting provider [place] or covering provider during after hours [place], for this patient?  Check the care team in [facility] and look for a) attending/consulting TR[facility] provider listed and b) the [facility] team listed Log into [email or web address] and use [place]'s universal password to access. If you do not have the password, please contact the hospital operator. Locate the [facility] provider you are looking for under [facility] and page to a number that you can be directly reached. If you still have difficulty reaching the provider, please page the [facility] (Director on Call) for the Hospitalists listed on amion for assistance.  05/09/2021, 1:15 PM

## 2021-05-09 NOTE — Progress Notes (Signed)
Pt d/w Dr Sharol Given and Dr Stanford Breed.  Will get duplex today for prognosis for BKA.  Dr Sharol Given will schedule BKA this week.  D/w pt.  Ruta Hinds, MD [facility] Office: [phone]

## 2021-05-09 NOTE — Progress Notes (Addendum)
[facility] Progress Note    Assessment/ Plan:   ESRD: had been on HD due to issues with CCPD: inadequate drainage/UF (catheter malfunction?) Outpatient orders: [place], MWF, F180, 3hrs, 2k, 2.5cal, 137 na, 35bicarb, 400bfr, 16g, edw 86.5kg. hep 2k bous -discussed with his outpatient home therapies RN 7/5: no plans for further PD and is being scheduled to have PD cath removed, therefore no PD cath flushing needed. -next HD on 7/6   Cellulitis/diabetic foot, progressive gangrene -abx per primary service, ortho and vvs consulted. Will likely need left BKA   Volume/ hypertension: EDW 86.5kg. Will UF as tolerated   Anemia of Chronic Kidney Disease: Hemoglobin 7.8. Avoiding IV iron for now, receives mircera 150 mct, last dose 6/24, ordered aranesp here   Secondary Hyperparathyroidism/Hyperphosphatemia: continue with home meds    Vascular access: AVF with good B/T. PD cath in place   DM2 w/ hyperglycemia -mgmt per primary service   # Additional recommendations: - Dose all meds for creatinine clearance < 10 ml/min  - Unless absolutely necessary, no MRIs with gadolinium.  - Implement save arm precautions.  Prefer needle sticks in the dorsum of the hands or wrists.  No blood pressure measurements in arm. - If blood transfusion is requested during hemodialysis sessions, please alert [place] prior to the session. - If a hemodialysis catheter line culture is requested, please alert [place] as only hemodialysis nurses are able to collect those specimens.    Gean Quint, MD [facility]  Subjective:   No acute events, tolerated HD yesterday without incident, net uf 2L. See above, spoke with outpatient home therapies RN, no flushing of PD cath needed given that he will be having it removed   Objective:   BP 98/68 (BP Location: Right Arm)   Pulse 86   Temp 98.5 F (36.9 C)   Resp 17   Wt 89.9 kg   SpO2 96%   BMI 27.64 kg/m   Intake/Output Summary (Last 24  hours) at 05/09/2021 1018 Last data filed at 05/09/2021 0505 Gross per 24 hour  Intake 840.57 ml  Output 2000 ml  Net -1159.43 ml   Weight change: 2 kg  Physical Exam: Gen:nad, nontoxic appearing CVS:rrr Resp:normal wob, no acc muscle use, speaking in full sentencess Abd:pd cath in place, soft, nt/nd Ext:no edema Neuro: awake, alert, speech clear and coherent, moves all ext spontaneously Access: avf +b/t  Imaging: DG Foot Complete Left  Result Date: 05/07/2021 CLINICAL DATA:  Posterior and lateral left foot pain and wounds for 3 months. History of diabetes. EXAM: LEFT FOOT - COMPLETE 3+ VIEW COMPARISON:  04/10/2021 FINDINGS: Extensive vascular calcifications in the soft tissues. Degenerative changes in the interphalangeal joints and intertarsal joints. Plantar calcaneal spur. No evidence of acute fracture or dislocation. No focal bone lesion or bone erosion. No cortical erosion or sclerosis to suggest osteomyelitis. No radiopaque soft tissue foreign bodies or soft tissue gas identified. IMPRESSION: No acute bony abnormalities. No radiographic evidence of osteomyelitis. Extensive vascular calcifications. Electronically Signed   By: Lucienne Capers M.D.   On: 05/07/2021 19:19   VAS [place] UPPER EXTREMITY ARTERIAL DUPLEX  Result Date: 05/08/2021  UPPER EXTREMITY DUPLEX STUDY Patient Name:  Maxwell Harmon  Date of Exam:   05/08/2021 Medical Rec #: [id number]        Accession #:    [id number] Date of Birth: [birth year]        Patient Gender: M Patient Age:   65Y Exam [facility]  Procedure:      VAS [place] UPPER EXTREMITY ARTERIAL DUPLEX Referring Phys: [id number] [place] --------------------------------------------------------------------------------  Indications: Intermittent pallor, poor capillary refill, and weak radial pulse              in the left upper extremity. History:     Patient has a history of Left upper extremity AV fistula (basilic).  Risk Factors:  Hypertension,  hyperlipidemia, Diabetes, coronary artery disease. Other Factors: PAD with history of left LE ischemia, ESRD Comparison Study: No prior study on file Performing Technologist: [name] RDMS,RVT  Examination Guidelines: A complete evaluation includes B-mode imaging, spectral Doppler, color Doppler, and power Doppler as needed of all accessible portions of each vessel. Bilateral testing is considered an integral part of a complete examination. Limited examinations for reoccurring indications may be performed as noted.   Summary:  Left: 50-74% stenosis noted at the anastomosis. Radial artery       appears patent with reversal of flow. *See table(s) above for measurements and observations. Electronically signed by Harold Barban MD on 05/08/2021 at 2:28:39 PM.    Final     Labs: BMET Recent Labs  Lab 05/07/21 1709 05/08/21 0120  NA 134* 134*  K 4.1 4.4  CL 92* 93*  CO2 28 26  GLUCOSE 185* 221*  BUN 57* 61*  CREATININE 6.26* 6.75*  CALCIUM 8.9 8.3*   CBC Recent Labs  Lab 05/07/21 1709 05/08/21 0120 05/08/21 1232  WBC 13.4* 11.7* 11.3*  NEUTROABS 11.1*  --   --   HGB 8.5* 7.8* 8.2*  HCT 27.3* 25.0* 26.4*  MCV 101.1* 100.0 99.6  PLT 174 149* 149*    Medications:     aspirin EC  81 mg Oral Daily   atorvastatin  80 mg Oral Daily   calcitRIOL  0.25 mcg Oral BID   Chlorhexidine Gluconate Cloth  6 each Topical Q0600   clopidogrel  75 mg Oral Daily   [START ON 05/10/2021] darbepoetin ([name]) injection - DIALYSIS  150 mcg Intravenous Q Wed-HD   escitalopram  10 mg Oral Daily   ferric citrate  630 mg Oral TID   heparin  5,000 Units Subcutaneous Q8H   insulin aspart  0-6 Units Subcutaneous TID WC   pentoxifylline  400 mg Oral TID WC   sodium chloride flush  3 mL Intravenous Q12H   tamsulosin  0.4 mg Oral QHS      Gean Quint, MD [facility] 05/09/2021, 10:18 AM

## 2021-05-10 ENCOUNTER — Other Ambulatory Visit: Payer: Self-pay | Admitting: Physician Assistant

## 2021-05-10 DIAGNOSIS — L03116 Cellulitis of left lower limb: Secondary | ICD-10-CM | POA: Diagnosis not present

## 2021-05-10 LAB — GLUCOSE, CAPILLARY
Glucose-Capillary: 69 mg/dL — ABNORMAL LOW (ref 70–99)
Glucose-Capillary: 95 mg/dL (ref 70–99)
Glucose-Capillary: 73 mg/dL (ref 70–99)
Glucose-Capillary: 147 mg/dL — ABNORMAL HIGH (ref 70–99)
Glucose-Capillary: 179 mg/dL — ABNORMAL HIGH (ref 70–99)

## 2021-05-10 LAB — FERRITIN: Ferritin: 880 ng/mL — ABNORMAL HIGH (ref 24–336)

## 2021-05-10 LAB — RETICULOCYTES
Immature Retic Fract: 21.8 % — ABNORMAL HIGH (ref 2.3–15.9)
RBC.: 2.58 MIL/uL — ABNORMAL LOW (ref 4.22–5.81)
Retic Count, Absolute: 45.9 10*3/uL (ref 19.0–186.0)
Retic Ct Pct: 1.8 % (ref 0.4–3.1)

## 2021-05-10 LAB — IRON AND TIBC
Iron: 27 ug/dL — ABNORMAL LOW (ref 45–182)
Saturation Ratios: 17 % — ABNORMAL LOW (ref 17.9–39.5)
TIBC: 158 ug/dL — ABNORMAL LOW (ref 250–450)
UIBC: 131 ug/dL

## 2021-05-10 LAB — VITAMIN B12: Vitamin B-12: 527 pg/mL (ref 180–914)

## 2021-05-10 LAB — FOLATE: Folate: 5.7 ng/mL — ABNORMAL LOW (ref >5.9)

## 2021-05-10 MED ORDER — VANCOMYCIN HCL 750 MG/150ML IV SOLN
INTRAVENOUS | Status: AC
  Administered 2021-05-10: 750 mg
  Filled 2021-05-10: qty 150

## 2021-05-10 MED ORDER — HYDROCODONE-ACETAMINOPHEN 5-325 MG PO TABS
ORAL_TABLET | ORAL | Status: AC
  Filled 2021-05-10: qty 1

## 2021-05-10 MED ORDER — HYDROCODONE-ACETAMINOPHEN 5-325 MG PO TABS
1.0000 | ORAL_TABLET | ORAL | Status: DC | PRN
Start: 2021-05-10 — End: 2021-05-16
  Administered 2021-05-10 (×2): 2 via ORAL
  Administered 2021-05-10: 1 via ORAL
  Administered 2021-05-11 (×2): 2 via ORAL
  Administered 2021-05-11: 1 via ORAL
  Administered 2021-05-12 – 2021-05-13 (×2): 2 via ORAL
  Administered 2021-05-14: 1 via ORAL
  Administered 2021-05-14 – 2021-05-15 (×2): 2 via ORAL
  Administered 2021-05-15: 1 via ORAL
  Filled 2021-05-10: qty 2
  Filled 2021-05-10: qty 1
  Filled 2021-05-10: qty 2
  Filled 2021-05-10: qty 1
  Filled 2021-05-10: qty 2
  Filled 2021-05-10: qty 1
  Filled 2021-05-10 (×6): qty 2

## 2021-05-10 MED ORDER — DARBEPOETIN ALFA 150 MCG/0.3ML IJ SOSY
PREFILLED_SYRINGE | INTRAMUSCULAR | Status: AC
  Administered 2021-05-10: 150 ug via INTRAVENOUS
  Filled 2021-05-10: qty 0.3

## 2021-05-10 MED ORDER — SORBITOL 70 % SOLN
30.0000 mL | Freq: Every day | Status: DC
  Administered 2021-05-10 – 2021-05-14 (×4): 30 mL via ORAL
  Filled 2021-05-10 (×4): qty 30

## 2021-05-10 NOTE — H&P (View-Only) (Signed)
Patient ID: Maxwell Harmon, male   DOB: [birth year], 65 y.o.   MRN: [id number] I reviewed with the patient the extent of the ulceration to the left foot as well as the vascular findings.  With the patient's duplex studies showing mid popliteal stenosis of 75 to 99% he is not a candidate for revascularization to the left lower extremity.  Discussed that his best option is to proceed with a left transtibial amputation.  I will tentatively plan for surgery Friday morning.  Patient's dialysis will need to be coordinated with this.  Will plan to transfuse with 1 unit PRBC tomorrow

## 2021-05-10 NOTE — Progress Notes (Signed)
Pharmacy Antibiotic Note  Maxwell Harmon is a 64 y.o. male admitted on 05/07/2021 with wound infection  Pharmacy has been consulted for Vancomycin dosing.  ID: chronic ischemic leg ulcer - Patient is s/p revascularization of the leg. Progressive gangrene. No osteo.  - Afebrile, WBC 11.3. ESRD  7/3 vanc>> 7/4 flagyl>> 7/4 ceftriaxone>>  7/3 blood>>NGTD  Plan: Vancomycin 1750 mg x 1 then 750mg  MWF with HD per pharmacy Impending LE amputation.    Weight: 88 kg (194 lb 0.1 oz)  Temp (24hrs), Avg:98.2 F (36.8 C), Min:98 F (36.7 C), Max:98.4 F (36.9 C)  Recent Labs  Lab 05/07/21 1709 05/07/21 2033 05/08/21 0120 05/08/21 1232  WBC 13.4*  --  11.7* 11.3*  CREATININE 6.26*  --  6.75*  --   LATICACIDVEN 1.6 1.4  --   --     Estimated Creatinine Clearance: 11.8 mL/min (A) (by C-G formula based on SCr of 6.75 mg/dL (H)).    No Known Allergies  Maxwell Harmon S. Alford [place], PharmD, BCPS Clinical Staff Pharmacist [email or web address]  [place] 05/10/2021 10:55 AM

## 2021-05-10 NOTE — Progress Notes (Signed)
Patient ID: Maxwell Harmon, male   DOB: 1956-03-26, 65 y.o.   MRN: [id number] I reviewed with the patient the extent of the ulceration to the left foot as well as the vascular findings.  With the patient's duplex studies showing mid popliteal stenosis of 75 to 99% he is not a candidate for revascularization to the left lower extremity.  Discussed that his best option is to proceed with a left transtibial amputation.  I will tentatively plan for surgery Friday morning.  Patient's dialysis will need to be coordinated with this.  Will plan to transfuse with 1 unit PRBC tomorrow

## 2021-05-10 NOTE — Hospital Course (Addendum)
IMPRESSIONS     1. Left ventricular ejection fraction, by estimation, is 25 to 30%. Left  ventricular ejection fraction by 3D volume is 30 %. The left ventricle has  severely decreased function. The left ventricle demonstrates regional wall  motion abnormalities (see  scoring diagram/findings for description). There is severe hypokinesis of  the mid-to-apical inferior, mid-to-apical septal, mid-to-apical anterior,  apical lateral LV segments. The apical segments and apex appear akinetic.  There is no LV thrombus  visualized. The left ventricular internal cavity size was mildly dilated.   2. Right ventricular systolic function is moderately reduced. The right  ventricular size is normal.   3. The mitral valve is degenerative. Moderate mitral annular  calcification.   4. The inferior vena cava is dilated in size with <50% respiratory  variability, suggesting right atrial pressure of 15 mmHg.   Comparison(s): Compared to prior TTE in 04/2021, there is no significant  change.

## 2021-05-10 NOTE — Procedures (Signed)
I was present at this dialysis session. I have reviewed the session itself and made appropriate changes.   Filed Weights   05/09/21 0500 05/10/21 0500 05/10/21 0815  Weight: 89.9 kg 91.2 kg 88 kg    Recent Labs  Lab 05/08/21 0120  NA 134*  K 4.4  CL 93*  CO2 26  GLUCOSE 221*  BUN 61*  CREATININE 6.75*  CALCIUM 8.3*    Recent Labs  Lab 05/07/21 1709 05/08/21 0120 05/08/21 1232  WBC 13.4* 11.7* 11.3*  NEUTROABS 11.1*  --   --   HGB 8.5* 7.8* 8.2*  HCT 27.3* 25.0* 26.4*  MCV 101.1* 100.0 99.6  PLT 174 149* 149*    Scheduled Meds:  aspirin EC  81 mg Oral Daily   atorvastatin  80 mg Oral Daily   calcitRIOL  0.25 mcg Oral BID   Chlorhexidine Gluconate Cloth  6 each Topical Q0600   clopidogrel  75 mg Oral Daily   darbepoetin ([name]) injection - DIALYSIS  150 mcg Intravenous Q Wed-HD   escitalopram  10 mg Oral Daily   ferric citrate  630 mg Oral TID   heparin  5,000 Units Subcutaneous Q8H   insulin aspart  0-6 Units Subcutaneous TID WC   pentoxifylline  400 mg Oral TID WC   sodium chloride flush  3 mL Intravenous Q12H   tamsulosin  0.4 mg Oral QHS   Continuous Infusions:  cefTRIAXone (ROCEPHIN)  IV 2 g (05/09/21 2139)   metronidazole 500 mg (05/10/21 5208)   vancomycin 750 mg (05/08/21 1645)   PRN Meds:.acetaminophen **OR** acetaminophen, clonazePAM, HYDROcodone-acetaminophen, ondansetron (ZOFRAN) IV, polyethylene glycol   Gean Quint, MD [facility] 05/10/2021, 10:23 AM

## 2021-05-10 NOTE — Progress Notes (Addendum)
Progress Note    05/10/2021 7:03 AM * No surgery found *  Subjective:  Trying to process impending LE amputation. Has had episode of vomiting.   Vitals:   05/09/21 2120 05/10/21 0518  BP: 112/70 112/76  Pulse: 90 98  Resp: 18 18  Temp: 98.4 F (36.9 C) 98.4 F (36.9 C)  SpO2: 96% 93%    Physical Exam: General appearance: Awake, alert in no apparent distress Cardiac: Heart rate and rhythm are regular Respirations: Nonlabored Extremities: Gangrenous changes left foot noted and unchanged.  CBC    Component Value Date/Time   WBC 11.3 (H) 05/08/2021 1232   RBC 2.65 (L) 05/08/2021 1232   HGB 8.2 (L) 05/08/2021 1232   HCT 26.4 (L) 05/08/2021 1232   PLT 149 (L) 05/08/2021 1232   MCV 99.6 05/08/2021 1232   MCH 30.9 05/08/2021 1232   MCHC 31.1 05/08/2021 1232   RDW 16.1 (H) 05/08/2021 1232   LYMPHSABS 1.3 05/07/2021 1709   MONOABS 0.9 05/07/2021 1709   EOSABS 0.0 05/07/2021 1709   BASOSABS 0.0 05/07/2021 1709    BMET    Component Value Date/Time   NA 134 (L) 05/08/2021 0120   K 4.4 05/08/2021 0120   CL 93 (L) 05/08/2021 0120   CO2 26 05/08/2021 0120   GLUCOSE 221 (H) 05/08/2021 0120   BUN 61 (H) 05/08/2021 0120   CREATININE 6.75 (H) 05/08/2021 0120   CALCIUM 8.3 (L) 05/08/2021 0120   GFRNONAA 8 (L) 05/08/2021 0120   GFRAA 10 (L) 01/11/2019 0523     Intake/Output Summary (Last 24 hours) at 05/10/2021 0703 Last data filed at 05/10/2021 0518 Gross per 24 hour  Intake 750 ml  Output 0 ml  Net 750 ml    HOSPITAL MEDICATIONS Scheduled Meds:  aspirin EC  81 mg Oral Daily   atorvastatin  80 mg Oral Daily   calcitRIOL  0.25 mcg Oral BID   Chlorhexidine Gluconate Cloth  6 each Topical Q0600   clopidogrel  75 mg Oral Daily   darbepoetin ([name]) injection - DIALYSIS  150 mcg Intravenous Q Wed-HD   escitalopram  10 mg Oral Daily   ferric citrate  630 mg Oral TID   heparin  5,000 Units Subcutaneous Q8H   insulin aspart  0-6 Units Subcutaneous TID WC    pentoxifylline  400 mg Oral TID WC   sodium chloride flush  3 mL Intravenous Q12H   tamsulosin  0.4 mg Oral QHS   Continuous Infusions:  cefTRIAXone (ROCEPHIN)  IV 2 g (05/09/21 2139)   metronidazole 500 mg (05/10/21 1324)   vancomycin 750 mg (05/08/21 1645)   PRN Meds:.acetaminophen **OR** acetaminophen, clonazePAM, HYDROcodone-acetaminophen, ondansetron (ZOFRAN) IV, polyethylene glycol  LLE arterial duplex 05/09/2021: Left: 75-99% stenosis noted in the popliteal artery.  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 He is s/p left lower extremity arteriogram and angioplasty lithotripsy of his left popliteal artery by Dr. Stanford Breed on April 19, 2021. LLE arterial duplex results reviewed>>stenosis of  mid-popliteal artery estimated in 75-99% range. Surgical intervention pending review and discussion with Drs. Fields/Sumiya Mamaril/Duda  Risa Grill, PA-C [facility] [phone] 05/10/2021  7:03 AM   VASCULAR STAFF ADDENDUM: I have independently interviewed and examined the patient. I agree with the above.  Unfortunately, has had a rapid deterioration of his heel wound. The wound involves the calcaneus, and there is likely underlying osteomyelitis.  I do not think  he has a salvageable foot. Agree with below knee amputation.  Yevonne Aline. Stanford Breed, MD [facility] Phone Number: [phone] 05/10/2021 5:57 PM

## 2021-05-10 NOTE — Progress Notes (Signed)
[facility] Progress Note    Assessment/ Plan:   ESRD: had been on HD due to issues with CCPD: inadequate drainage/UF (catheter malfunction?) Outpatient orders: [place], MWF, F180, 3hrs, 2k, 2.5cal, 137 na, 35bicarb, 400bfr, 16g, edw 86.5kg. hep 2k bous -discussed with his outpatient home therapies RN 7/5: no plans for further PD and is being scheduled to have PD cath removed, therefore no PD cath flushing needed. -next HD on 7/8   Cellulitis/diabetic foot, progressive gangrene -abx per primary service, ortho and vvs consulted. Will likely need left BKA   Volume/ hypertension: EDW 86.5kg. Will UF as tolerated   Anemia of Chronic Kidney Disease: Hemoglobin 7.8. Avoiding IV iron for now, receives mircera 150 mct, last dose 6/24, ordered aranesp here   Secondary Hyperparathyroidism/Hyperphosphatemia: continue with home meds    Vascular access: AVF with good B/T. PD cath in place   DM2 w/ hyperglycemia -mgmt per primary service   # Additional recommendations: - Dose all meds for creatinine clearance < 10 ml/min  - Unless absolutely necessary, no MRIs with gadolinium.  - Implement save arm precautions.  Prefer needle sticks in the dorsum of the hands or wrists.  No blood pressure measurements in arm. - If blood transfusion is requested during hemodialysis sessions, please alert [place] prior to the session. - If a hemodialysis catheter line culture is requested, please alert [place] as only hemodialysis nurses are able to collect those specimens.    Gean Quint, MD [facility]  Subjective:   Seen and examined bedside in HD. No acute events, had one episode of vomiting earlier. Tolerating hd, no complaints. Ufg 2L   Objective:   BP 107/67   Pulse 93   Temp 98 F (36.7 C) (Oral)   Resp 20   Wt 88 kg   SpO2 96%   BMI 27.06 kg/m   Intake/Output Summary (Last 24 hours) at 05/10/2021 1023 Last data filed at 05/10/2021 0800 Gross per 24 hour  Intake  810 ml  Output 0 ml  Net 810 ml   Weight change: 0.3 kg  Physical Exam: Gen:nad, nontoxic appearing CVS:rrr Resp:normal wob, no acc muscle use, speaking in full sentencess Abd:pd cath in place, soft, nt/nd Ext:no edema Neuro: awake, alert, speech clear and coherent, moves all ext spontaneously Access: avf +b/t  Imaging: VAS [place] UPPER EXTREMITY ARTERIAL DUPLEX  Result Date: 05/08/2021  UPPER EXTREMITY DUPLEX STUDY Patient Name:  Maxwell Harmon  Date of Exam:   05/08/2021 Medical Rec #: [id number]        Accession #:    [id number] Date of Birth: [birth year]        Patient Gender: M Patient Age:   40Y Exam Location:  [facility] Procedure:      VAS [place] UPPER EXTREMITY ARTERIAL DUPLEX Referring Phys: [id number] [place] --------------------------------------------------------------------------------  Indications: Intermittent pallor, poor capillary refill, and weak radial pulse              in the left upper extremity. History:     Patient has a history of Left upper extremity AV fistula (basilic).  Risk Factors:  Hypertension, hyperlipidemia, Diabetes, coronary artery disease. Other Factors: PAD with history of left LE ischemia, ESRD Comparison Study: No prior study on file Performing Technologist: [name] RDMS,RVT  Examination Guidelines: A complete evaluation includes B-mode imaging, spectral Doppler, color Doppler, and power Doppler as needed of all accessible portions of each vessel. Bilateral testing is considered an integral part of a complete examination.  Limited examinations for reoccurring indications may be performed as noted.   Summary:  Left: 50-74% stenosis noted at the anastomosis. Radial artery       appears patent with reversal of flow. *See table(s) above for measurements and observations. Electronically signed by Harold Barban MD on 05/08/2021 at 2:28:39 PM.    Final    VAS [place] LOWER EXTREMITY ARTERIAL DUPLEX  Result Date: 05/09/2021 LOWER EXTREMITY ARTERIAL DUPLEX  STUDY Patient Name:  Maxwell Harmon  Date of Exam:   05/09/2021 Medical Rec #: [id number]        Accession #:    [id number] Date of Birth: October 16, 1956        Patient Gender: M Patient Age:   89Y Exam Location:  [facility] Procedure:      VAS [place] LOWER EXTREMITY ARTERIAL DUPLEX Referring Phys: [place] --------------------------------------------------------------------------------  Indications: Peripheral artery disease, and Prognosis for BKA, s/p Lt popliteal              artery intravascular lithotripsy and angioplasty, PTA angioplasty              on 04-19-2021.  Current ABI: N/A Comparison Study: 04-14-2021 Abdominal aortogram showed left popliteal artery                   occlusion, below-knee popliteal calcified but patent, pero A                   and ATA occluded, stenosis of the PTA.                    ABI w/ TBI 04-11-2021 Non-compressible bilaterally, RT TBI 29                   LT TBI 0. Performing Technologist: [name] RDMS,RVT  Examination Guidelines: A complete evaluation includes B-mode imaging, spectral Doppler, color Doppler, and power Doppler as needed of all accessible portions of each vessel. Bilateral testing is considered an integral part of a complete examination. Limited examinations for reoccurring indications may be performed as noted.   +-----------+--------+-----+--------------+----------------+-------------------+ LEFT       PSV cm/sRatioStenosis      Waveform        Comments            +-----------+--------+-----+--------------+----------------+-------------------+ CFA Prox   73                         biphasic                            +-----------+--------+-----+--------------+----------------+-------------------+ CFA Mid    61                         biphasic                            +-----------+--------+-----+--------------+----------------+-------------------+ CFA Distal 60                         biphasic                             +-----------+--------+-----+--------------+----------------+-------------------+ DFA        46  biphasic                            +-----------+--------+-----+--------------+----------------+-------------------+ SFA Prox   51                         biphasic                            +-----------+--------+-----+--------------+----------------+-------------------+ SFA Mid    40                         monophasic                          +-----------+--------+-----+--------------+----------------+-------------------+ SFA Distal 66                         monophasic                          +-----------+--------+-----+--------------+----------------+-------------------+ POP Prox   125                        monophasic                          +-----------+--------+-----+--------------+----------------+-------------------+ POP Mid    413          75-99%        monophasic      Post-stenotic                               stenosis                      turbulence          +-----------+--------+-----+--------------+----------------+-------------------+ POP Distal 47                         monophasic                          +-----------+--------+-----+--------------+----------------+-------------------+ TP Trunk   23                         monophasic                          +-----------+--------+-----+--------------+----------------+-------------------+ ATA Mid    14                         dampened                                                                  monophasic                          +-----------+--------+-----+--------------+----------------+-------------------+ ATA Distal              occluded                                          +-----------+--------+-----+--------------+----------------+-------------------+  PTA Prox   75                                                              +-----------+--------+-----+--------------+----------------+-------------------+ PTA Mid    34                                                             +-----------+--------+-----+--------------+----------------+-------------------+ PTA Distal 47                                                             +-----------+--------+-----+--------------+----------------+-------------------+ PERO Distal             occluded                                          +-----------+--------+-----+--------------+----------------+-------------------+  Summary: Left: 75-99% stenosis noted in the popliteal artery.  See table(s) above for measurements and observations. Electronically signed by Ruta Hinds MD on 05/09/2021 at 3:50:08 PM.    Final     Labs: BMET Recent Labs  Lab 05/07/21 1709 05/08/21 0120  NA 134* 134*  K 4.1 4.4  CL 92* 93*  CO2 28 26  GLUCOSE 185* 221*  BUN 57* 61*  CREATININE 6.26* 6.75*  CALCIUM 8.9 8.3*   CBC Recent Labs  Lab 05/07/21 1709 05/08/21 0120 05/08/21 1232  WBC 13.4* 11.7* 11.3*  NEUTROABS 11.1*  --   --   HGB 8.5* 7.8* 8.2*  HCT 27.3* 25.0* 26.4*  MCV 101.1* 100.0 99.6  PLT 174 149* 149*    Medications:     aspirin EC  81 mg Oral Daily   atorvastatin  80 mg Oral Daily   calcitRIOL  0.25 mcg Oral BID   Chlorhexidine Gluconate Cloth  6 each Topical Q0600   clopidogrel  75 mg Oral Daily   darbepoetin ([name]) injection - DIALYSIS  150 mcg Intravenous Q Wed-HD   escitalopram  10 mg Oral Daily   ferric citrate  630 mg Oral TID   heparin  5,000 Units Subcutaneous Q8H   insulin aspart  0-6 Units Subcutaneous TID WC   pentoxifylline  400 mg Oral TID WC   sodium chloride flush  3 mL Intravenous Q12H   tamsulosin  0.4 mg Oral QHS      Gean Quint, MD [facility] 05/10/2021, 10:23 AM

## 2021-05-10 NOTE — Progress Notes (Signed)
PROGRESS NOTE   Maxwell Harmon  DXA:[id number] DOB: 1956-08-09 DOA: 05/07/2021 PCP: Maxwell Fear, DO  Brief Narrative:  65 year old white male CAD + CABG 2021 Charcot foot ESRD (prior malignancy status post nephrectomy) previously on peritoneal dialysis Chronic L LE diabetic wound previously followed by podiatry >5 months/cellulitis 04/19/2021 had angiogram with revascularization left popliteal, left posterior tibial artery with ICU stay 04/2021--discharged on antibiotics--underwent cath that time with no options for revascularization  Readmit 05/07/2021 worsening pain discharge erythema Lactic acid normal COVID-negative urinalysis negative Left foot x-ray showed no evidence of osteomyelitis  Orthopedic surgeon Dr. Sharol Given consulted Vascular surgeon Dr. Oneida Alar consulted   Hospital-Problem based course  Diabetic foot wound heel ulcer planning for amputation over the next several days Dr. Jeffie Pollock surgical colleagues to consult continue at this time Plavix and aspirin statin Doppler study performed shows 75 to 99% stenosed popliteal artery left side Trental for rest pain 400 3 times daily--increasing hydrocodone 1 to 2 tablets every 4 as needed as pain is not controlled Continue vancomycin Flagyl ceftriaxone at this time and de-escalate as needed Keep dressing wrapped and continue Prevalon boot Nausea this a.m.-significant constipation No stool since admission-DC MiraLAX-start sorbitol ESRD Planning as per nephrologist-- continue binders etc. as per them Anemia of renal disease Check iron stores next lab draw-continue Auryxia 630 3 times daily DM TY 2 with nephropathy neuropathy Uses continuous monitor at home Resume Lantus on discharge-has had complications with low blood sugars Anxiety Continue Lexapro 10, Klonopin 5 twice daily  DVT prophylaxis: Heparin Code Status: Full Family Communication: None present at the bedside at this time Disposition:  Status is:  Inpatient  Remains inpatient appropriate because:Hemodynamically unstable and Unsafe d/c plan  Dispo: The patient is from: Home              Anticipated d/c is to: SNF              Patient currently is not medically stable to d/c.   Difficult to place patient No       Consultants:  Vascular surgery Dr. Oneida Alar Orthopedic surgery Dr. Sharol Given  Procedures:   None yet  Antimicrobials:   Vancomycin, cefepime, Flagyl since admission   Subjective: Seen on HD unit In some distress with pain states it 6 on 10 Nausea this morning and did not really eat breakfast still contemplating steps with regards to amputation and wants more information about the same  Objective: Vitals:   05/10/21 0900 05/10/21 0930 05/10/21 1000 05/10/21 1030  BP: 106/65 105/64 107/67 120/66  Pulse: 96 93 93   Resp: (!) 25 (!) 23 20 (!) 27  Temp:      TempSrc:      SpO2:      Weight:        Intake/Output Summary (Last 24 hours) at 05/10/2021 1039 Last data filed at 05/10/2021 0800 Gross per 24 hour  Intake 810 ml  Output 0 ml  Net 810 ml   Filed Weights   05/09/21 0500 05/10/21 0500 05/10/21 0815  Weight: 89.9 kg 91.2 kg 88 kg    Examination:  Awake alert coherent no distress looks about stated age S1-S2 no murmur Chest is clear no rales no rhonchi Abdomen is soft no rebound no guarding ROM intact no focal deficit--all 4 limbs equally without gross deficit Neurologically intact--left lower extremity is wrapped with Coban dressing I do see some evidences of possible hematoma on the leg I cannot feel pulses and did not  Data Reviewed: personally reviewed  CBC    Component Value Date/Time   WBC 11.3 (H) 05/08/2021 1232   RBC 2.65 (L) 05/08/2021 1232   HGB 8.2 (L) 05/08/2021 1232   HCT 26.4 (L) 05/08/2021 1232   PLT 149 (L) 05/08/2021 1232   MCV 99.6 05/08/2021 1232   MCH 30.9 05/08/2021 1232   MCHC 31.1 05/08/2021 1232   RDW 16.1 (H) 05/08/2021 1232   LYMPHSABS 1.3 05/07/2021 1709    MONOABS 0.9 05/07/2021 1709   EOSABS 0.0 05/07/2021 1709   BASOSABS 0.0 05/07/2021 1709   CMP Latest Ref Rng & Units 05/08/2021 05/07/2021 04/20/2021  Glucose 70 - 99 mg/dL 221(H) 185(H) 195(H)  BUN 8 - 23 mg/dL 61(H) 57(H) 31(H)  Creatinine 0.61 - 1.24 mg/dL 6.75(H) 6.26(H) 5.42(H)  Sodium 135 - 145 mmol/L 134(L) 134(L) 135  Potassium 3.5 - 5.1 mmol/L 4.4 4.1 4.1  Chloride 98 - 111 mmol/L 93(L) 92(L) 98  CO2 22 - 32 mmol/L 26 28 26   Calcium 8.9 - 10.3 mg/dL 8.3(L) 8.9 8.1(L)  Total Protein 6.5 - 8.1 g/dL 5.8(L) 6.8 -  Total Bilirubin 0.3 - 1.2 mg/dL 1.1 1.3(H) -  Alkaline Phos 38 - 126 U/L 98 121 -  AST 15 - 41 U/L 25 29 -  ALT 0 - 44 U/L 19 24 -     Radiology Studies: VAS [place] LOWER EXTREMITY ARTERIAL DUPLEX  Result Date: 05/09/2021 LOWER EXTREMITY ARTERIAL DUPLEX STUDY Patient Name:  Maxwell Harmon  Date of Exam:   05/09/2021 Medical Rec #: [id number]        Accession #:    [id number] Date of Birth: 04-03-1956        Patient Gender: M Patient Age:   79Y Exam Location:  [facility] Procedure:      VAS [place] LOWER EXTREMITY ARTERIAL DUPLEX Referring Phys: [name] --------------------------------------------------------------------------------  Indications: Peripheral artery disease, and Prognosis for BKA, s/p Lt popliteal              artery intravascular lithotripsy and angioplasty, PTA angioplasty              on 04-19-2021.  Current ABI: N/A Comparison Study: 04-14-2021 Abdominal aortogram showed left popliteal artery                   occlusion, below-knee popliteal calcified but patent, pero A                   and ATA occluded, stenosis of the PTA.                    ABI w/ TBI 04-11-2021 Non-compressible bilaterally, RT TBI 29                   LT TBI 0. Performing Technologist: [name] RDMS,RVT  Examination Guidelines: A complete evaluation includes B-mode imaging, spectral Doppler, color Doppler, and power Doppler as needed of all accessible portions of each vessel.  Bilateral testing is considered an integral part of a complete examination. Limited examinations for reoccurring indications may be performed as noted.   +-----------+--------+-----+--------------+----------------+-------------------+ LEFT       PSV cm/sRatioStenosis      Waveform        Comments            +-----------+--------+-----+--------------+----------------+-------------------+ CFA Prox   73                         biphasic                            +-----------+--------+-----+--------------+----------------+-------------------+  CFA Mid    61                         biphasic                            +-----------+--------+-----+--------------+----------------+-------------------+ CFA Distal 60                         biphasic                            +-----------+--------+-----+--------------+----------------+-------------------+ DFA        46                         biphasic                            +-----------+--------+-----+--------------+----------------+-------------------+ SFA Prox   51                         biphasic                            +-----------+--------+-----+--------------+----------------+-------------------+ SFA Mid    40                         monophasic                          +-----------+--------+-----+--------------+----------------+-------------------+ SFA Distal 66                         monophasic                          +-----------+--------+-----+--------------+----------------+-------------------+ POP Prox   125                        monophasic                          +-----------+--------+-----+--------------+----------------+-------------------+ POP Mid    413          75-99%        monophasic      Post-stenotic                               stenosis                      turbulence          +-----------+--------+-----+--------------+----------------+-------------------+ POP Distal  47                         monophasic                          +-----------+--------+-----+--------------+----------------+-------------------+ TP Trunk   23                         monophasic                          +-----------+--------+-----+--------------+----------------+-------------------+  ATA Mid    14                         dampened                                                                  monophasic                          +-----------+--------+-----+--------------+----------------+-------------------+ ATA Distal              occluded                                          +-----------+--------+-----+--------------+----------------+-------------------+ PTA Prox   75                                                             +-----------+--------+-----+--------------+----------------+-------------------+ PTA Mid    34                                                             +-----------+--------+-----+--------------+----------------+-------------------+ PTA Distal 47                                                             +-----------+--------+-----+--------------+----------------+-------------------+ PERO Distal             occluded                                          +-----------+--------+-----+--------------+----------------+-------------------+  Summary: Left: 75-99% stenosis noted in the popliteal artery.  See table(s) above for measurements and observations. Electronically signed by Ruta Hinds MD on 05/09/2021 at 3:50:08 PM.    Final      Scheduled Meds:  aspirin EC  81 mg Oral Daily   atorvastatin  80 mg Oral Daily   calcitRIOL  0.25 mcg Oral BID   Chlorhexidine Gluconate Cloth  6 each Topical Q0600   clopidogrel  75 mg Oral Daily   Darbepoetin Alfa       darbepoetin ([name]) injection - DIALYSIS  150 mcg Intravenous Q Wed-HD   escitalopram  10 mg Oral Daily   ferric citrate  630 mg Oral TID    heparin  5,000 Units Subcutaneous Q8H   insulin aspart  0-6 Units Subcutaneous TID WC   pentoxifylline  400 mg Oral TID WC   sodium chloride flush  3 mL Intravenous Q12H   tamsulosin  0.4 mg Oral  QHS   Continuous Infusions:  cefTRIAXone (ROCEPHIN)  IV 2 g (05/09/21 2139)   metronidazole 500 mg (05/10/21 6751)   vancomycin 750 mg (05/08/21 1645)     LOS: 2 days   Time spent: [place], MD [facility] To contact the attending provider between 7A-7P or the covering provider during after hours 7P-7A, please log into the web site [email or web address] and access using universal [place] password for that web site. If you do not have the password, please call the hospital operator.  05/10/2021, 10:39 AM

## 2021-05-11 DIAGNOSIS — L03116 Cellulitis of left lower limb: Secondary | ICD-10-CM | POA: Diagnosis not present

## 2021-05-11 LAB — PREPARE RBC (CROSSMATCH)

## 2021-05-11 LAB — GLUCOSE, CAPILLARY
Glucose-Capillary: 169 mg/dL — ABNORMAL HIGH (ref 70–99)
Glucose-Capillary: 241 mg/dL — ABNORMAL HIGH (ref 70–99)
Glucose-Capillary: 200 mg/dL — ABNORMAL HIGH (ref 70–99)
Glucose-Capillary: 201 mg/dL — ABNORMAL HIGH (ref 70–99)

## 2021-05-11 LAB — HEMOGLOBIN AND HEMATOCRIT, BLOOD
HCT: 33.1 % — ABNORMAL LOW (ref 39.0–52.0)
Hemoglobin: 10.3 g/dL — ABNORMAL LOW (ref 13.0–17.0)

## 2021-05-11 MED ORDER — SODIUM CHLORIDE 0.9% IV SOLUTION: Freq: Once | INTRAVENOUS | Status: AC

## 2021-05-11 MED ORDER — FLEET ENEMA 7-19 GM/118ML RE ENEM
1.0000 | ENEMA | Freq: Once | RECTAL | Status: AC
  Administered 2021-05-11: 1 via RECTAL
  Filled 2021-05-11: qty 1

## 2021-05-11 MED ORDER — VANCOMYCIN HCL IN DEXTROSE 1-5 GM/200ML-% IV SOLN
1000.0000 mg | INTRAVENOUS | Status: DC
  Filled 2021-05-11: qty 200

## 2021-05-11 NOTE — Progress Notes (Signed)
Pharmacy Antibiotic Note  Maxwell Harmon is a 64 y.o. male admitted on 05/07/2021 with  wound infection .  Pharmacy has been consulted for vancomycin dosing.  Plan: Vancomycin 1000mg  MWF - increase vancomycin for weight >80kg  Weight: 88.3 kg (194 lb 10.7 oz)  Temp (24hrs), Avg:97.9 F (36.6 C), Min:97.4 F (36.3 C), Max:98.5 F (36.9 C)  Recent Labs  Lab 05/07/21 1709 05/07/21 2033 05/08/21 0120 05/08/21 1232  WBC 13.4*  --  11.7* 11.3*  CREATININE 6.26*  --  6.75*  --   LATICACIDVEN 1.6 1.4  --   --     Estimated Creatinine Clearance: 11.8 mL/min (A) (by C-G formula based on SCr of 6.75 mg/dL (H)).    No Known Allergies   ID: chronic ischemic leg ulcer - Patient is s/p revascularization of the leg. Progressive gangrene. No osteo.  - Afebrile, WBC 11.3. ESRD  7/3 vanc>> 7/4 flagyl>> 7/4 ceftriaxone>>  7/3 blood>>NGTD  Thank you for allowing pharmacy to be a part of this patient's care.  Liz Beach, PharmD 05/11/2021 11:21 AM

## 2021-05-11 NOTE — Progress Notes (Signed)
Messaged MD to get an order for Type and Screen.    Maxwell Harmon

## 2021-05-11 NOTE — Progress Notes (Signed)
  Progress Note    05/11/2021 7:57 AM * No surgery date entered *  Subjective:  OOB in chair trying to eat some breakfast. Still with some small volume emesis   Vitals:   05/10/21 2048 05/11/21 0511  BP: 119/68 123/72  Pulse: 97 97  Resp: 20 18  Temp: 98.5 F (36.9 C) 97.9 F (36.6 C)  SpO2: 94% 97%    Physical Exam:  General appearance: Awake, alert in no apparent distress Cardiac: Heart rate and rhythm are regular Respirations: Nonlabored Extremities: Gangrenous changes left foot noted and unchanged.      CBC    Component Value Date/Time   WBC 11.3 (H) 05/08/2021 1232   RBC 2.58 (L) 05/10/2021 1502   RBC 2.65 (L) 05/08/2021 1232   HGB 8.2 (L) 05/08/2021 1232   HCT 26.4 (L) 05/08/2021 1232   PLT 149 (L) 05/08/2021 1232   MCV 99.6 05/08/2021 1232   MCH 30.9 05/08/2021 1232   MCHC 31.1 05/08/2021 1232   RDW 16.1 (H) 05/08/2021 1232   LYMPHSABS 1.3 05/07/2021 1709   MONOABS 0.9 05/07/2021 1709   EOSABS 0.0 05/07/2021 1709   BASOSABS 0.0 05/07/2021 1709    BMET    Component Value Date/Time   NA 134 (L) 05/08/2021 0120   K 4.4 05/08/2021 0120   CL 93 (L) 05/08/2021 0120   CO2 26 05/08/2021 0120   GLUCOSE 221 (H) 05/08/2021 0120   BUN 61 (H) 05/08/2021 0120   CREATININE 6.75 (H) 05/08/2021 0120   CALCIUM 8.3 (L) 05/08/2021 0120   GFRNONAA 8 (L) 05/08/2021 0120   GFRAA 10 (L) 01/11/2019 0523     Intake/Output Summary (Last 24 hours) at 05/11/2021 0757 Last data filed at 05/11/2021 0300 Gross per 24 hour  Intake 2078.92 ml  Output 1500 ml  Net 578.92 ml    HOSPITAL MEDICATIONS Scheduled Meds:  sodium chloride   Intravenous Once   aspirin EC  81 mg Oral Daily   atorvastatin  80 mg Oral Daily   calcitRIOL  0.25 mcg Oral BID   Chlorhexidine Gluconate Cloth  6 each Topical Q0600   clopidogrel  75 mg Oral Daily   darbepoetin ([name]) injection - DIALYSIS  150 mcg Intravenous Q Wed-HD   escitalopram  10 mg Oral Daily   ferric citrate  630 mg Oral TID    heparin  5,000 Units Subcutaneous Q8H   insulin aspart  0-6 Units Subcutaneous TID WC   pentoxifylline  400 mg Oral TID WC   sodium chloride flush  3 mL Intravenous Q12H   sorbitol  30 mL Oral Daily   tamsulosin  0.4 mg Oral QHS   Continuous Infusions:  cefTRIAXone (ROCEPHIN)  IV 2 g (05/10/21 2200)   metronidazole 500 mg (05/11/21 5465)   vancomycin 750 mg (05/08/21 1645)   PRN Meds:.acetaminophen **OR** acetaminophen, clonazePAM, HYDROcodone-acetaminophen, ondansetron (ZOFRAN) IV  Assessment and Plan: 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 He is s/p left lower extremity arteriogram and angioplasty lithotripsy of his left popliteal artery by Dr. Stanford Breed on April 19, 2021. LLE arterial duplex results reviewed>>stenosis of  mid-popliteal artery estimated in 75-99% range.   For left BKA tomorrow with Dr. Roslynn Amble, PA-C [facility] [phone] 05/11/2021  7:57 AM

## 2021-05-11 NOTE — Progress Notes (Signed)
[facility] Progress Note    Assessment/ Plan:   ESRD: had been on HD due to issues with CCPD: inadequate drainage/UF (catheter malfunction?) Outpatient orders: [place], MWF, F180, 3hrs, 2k, 2.5cal, 137 na, 35bicarb, 400bfr, 16g, edw 86.5kg. hep 2k bous -discussed with his outpatient home therapies RN 7/5: no plans for further PD and is being scheduled to have PD cath removed, therefore no PD cath flushing needed. -next HD on 7/8, will coordinate around surgery, will likely to HD after surgery. If labs and vol status are okay then can potentially move HD to Saturday   Cellulitis/diabetic foot, progressive gangrene -abx per primary service, ortho and vvs consulted. Left bka tomorrow 7/8   Volume/ hypertension: EDW 86.5kg. Will UF as tolerated   Anemia of Chronic Kidney Disease: Hemoglobin 8.2. Avoiding IV iron for now, receives mircera 150 mct, last dose 6/24, ordered aranesp here. 1u prbc today per surgery   Secondary Hyperparathyroidism/Hyperphosphatemia: continue with home meds    Vascular access: AVF with good B/T. PD cath in place   DM2 w/ hyperglycemia -mgmt per primary service   # Additional recommendations: - Dose all meds for creatinine clearance < 10 ml/min  - Unless absolutely necessary, no MRIs with gadolinium.  - Implement save arm precautions.  Prefer needle sticks in the dorsum of the hands or wrists.  No blood pressure measurements in arm. - If blood transfusion is requested during hemodialysis sessions, please alert [place] prior to the session. - If a hemodialysis catheter line culture is requested, please alert [place] as only hemodialysis nurses are able to collect those specimens.    Gean Quint, MD [facility]  Subjective:   Seen and examined bedside. Toelrated hd yesterday, net uf 1.5L. no complaints. Bka tomorrow.   Objective:   BP 96/61   Pulse 90   Temp (!) 97.4 F (36.3 C) (Oral)   Resp 16   Wt 88.3 kg   SpO2 97%    BMI 27.15 kg/m   Intake/Output Summary (Last 24 hours) at 05/11/2021 1107 Last data filed at 05/11/2021 0800 Gross per 24 hour  Intake 2078.92 ml  Output 1500 ml  Net 578.92 ml   Weight change: -3.2 kg  Physical Exam: Gen:nad, nontoxic appearing CVS:rrr Resp:normal wob, no acc muscle use, speaking in full sentencess Abd:pd cath in place, soft, nt/nd Ext:no edema Neuro: awake, alert, speech clear and coherent, moves all ext spontaneously Access: avf +b/t  Imaging: VAS [place] LOWER EXTREMITY ARTERIAL DUPLEX  Result Date: 05/09/2021 LOWER EXTREMITY ARTERIAL DUPLEX STUDY Patient Name:  Maxwell Harmon  Date of Exam:   05/09/2021 Medical Rec #: [id number]        Accession #:    [id number] Date of Birth: 1956/09/17        Patient Gender: M Patient Age:   17Y Exam Location:  [facility] Procedure:      VAS [place] LOWER EXTREMITY ARTERIAL DUPLEX Referring Phys: [place] --------------------------------------------------------------------------------  Indications: Peripheral artery disease, and Prognosis for BKA, s/p Lt popliteal              artery intravascular lithotripsy and angioplasty, PTA angioplasty              on 04-19-2021.  Current ABI: N/A Comparison Study: 04-14-2021 Abdominal aortogram showed left popliteal artery                   occlusion, below-knee popliteal calcified but patent, pero A  and ATA occluded, stenosis of the PTA.                    ABI w/ TBI 04-11-2021 Non-compressible bilaterally, RT TBI 29                   LT TBI 0. Performing Technologist: [name] RDMS,RVT  Examination Guidelines: A complete evaluation includes B-mode imaging, spectral Doppler, color Doppler, and power Doppler as needed of all accessible portions of each vessel. Bilateral testing is considered an integral part of a complete examination. Limited examinations for reoccurring indications may be performed as noted.    +-----------+--------+-----+--------------+----------------+-------------------+ LEFT       PSV cm/sRatioStenosis      Waveform        Comments            +-----------+--------+-----+--------------+----------------+-------------------+ CFA Prox   73                         biphasic                            +-----------+--------+-----+--------------+----------------+-------------------+ CFA Mid    61                         biphasic                            +-----------+--------+-----+--------------+----------------+-------------------+ CFA Distal 60                         biphasic                            +-----------+--------+-----+--------------+----------------+-------------------+ DFA        46                         biphasic                            +-----------+--------+-----+--------------+----------------+-------------------+ SFA Prox   51                         biphasic                            +-----------+--------+-----+--------------+----------------+-------------------+ SFA Mid    40                         monophasic                          +-----------+--------+-----+--------------+----------------+-------------------+ SFA Distal 66                         monophasic                          +-----------+--------+-----+--------------+----------------+-------------------+ POP Prox   125                        monophasic                          +-----------+--------+-----+--------------+----------------+-------------------+  POP Mid    413          75-99%        monophasic      Post-stenotic                               stenosis                      turbulence          +-----------+--------+-----+--------------+----------------+-------------------+ POP Distal 47                         monophasic                          +-----------+--------+-----+--------------+----------------+-------------------+ TP  Trunk   23                         monophasic                          +-----------+--------+-----+--------------+----------------+-------------------+ ATA Mid    14                         dampened                                                                  monophasic                          +-----------+--------+-----+--------------+----------------+-------------------+ ATA Distal              occluded                                          +-----------+--------+-----+--------------+----------------+-------------------+ PTA Prox   75                                                             +-----------+--------+-----+--------------+----------------+-------------------+ PTA Mid    34                                                             +-----------+--------+-----+--------------+----------------+-------------------+ PTA Distal 47                                                             +-----------+--------+-----+--------------+----------------+-------------------+ PERO Distal             occluded                                          +-----------+--------+-----+--------------+----------------+-------------------+  Summary: Left: 75-99% stenosis noted in the popliteal artery.  See table(s) above for measurements and observations. Electronically signed by Ruta Hinds MD on 05/09/2021 at 3:50:08 PM.    Final     Labs: BMET Recent Labs  Lab 05/07/21 1709 05/08/21 0120  NA 134* 134*  K 4.1 4.4  CL 92* 93*  CO2 28 26  GLUCOSE 185* 221*  BUN 57* 61*  CREATININE 6.26* 6.75*  CALCIUM 8.9 8.3*   CBC Recent Labs  Lab 05/07/21 1709 05/08/21 0120 05/08/21 1232  WBC 13.4* 11.7* 11.3*  NEUTROABS 11.1*  --   --   HGB 8.5* 7.8* 8.2*  HCT 27.3* 25.0* 26.4*  MCV 101.1* 100.0 99.6  PLT 174 149* 149*    Medications:     sodium chloride   Intravenous Once   aspirin EC  81 mg Oral Daily   atorvastatin  80 mg Oral Daily    calcitRIOL  0.25 mcg Oral BID   Chlorhexidine Gluconate Cloth  6 each Topical Q0600   clopidogrel  75 mg Oral Daily   darbepoetin ([name]) injection - DIALYSIS  150 mcg Intravenous Q Wed-HD   escitalopram  10 mg Oral Daily   ferric citrate  630 mg Oral TID   heparin  5,000 Units Subcutaneous Q8H   insulin aspart  0-6 Units Subcutaneous TID WC   pentoxifylline  400 mg Oral TID WC   sodium chloride flush  3 mL Intravenous Q12H   sodium phosphate  1 enema Rectal Once   sorbitol  30 mL Oral Daily   tamsulosin  0.4 mg Oral QHS      Gean Quint, MD [facility] 05/11/2021, 11:07 AM

## 2021-05-11 NOTE — Progress Notes (Addendum)
PROGRESS NOTE   Maxwell Harmon  ZHG:[id number] DOB: 21-Jun-1956 DOA: 05/07/2021 PCP: [name], DO  Brief Narrative:  64 year old white male CAD + CABG 2021 Charcot foot ESRD (prior malignancy status post nephrectomy) previously on peritoneal dialysis Chronic L LE diabetic wound previously followed by podiatry >5 months/cellulitis 04/19/2021 had angiogram with revascularization left popliteal, left posterior tibial artery with ICU stay 04/2021--discharged on antibiotics--underwent cath that time with no options for revascularization  Readmit 05/07/2021 worsening pain discharge erythema Lactic acid normal COVID-negative urinalysis negative Left foot x-ray showed no evidence of osteomyelitis  Orthopedic surgeon Dr. Sharol Given consulted Vascular surgeon Dr. Oneida Alar consulted  Amputation planned for 05/13/19/2020   Hospital-Problem based course  Diabetic foot wound heel ulcer BKA planned for 05/12/2021 under Dr. Sharol Given Doppler study performed shows 75 to 99% stenosed popliteal artery left side Trental for rest pain 400 3 times daily--continue hydrocodone 1-2 tab every 4 as needed p Continue vancomycin Flagyl ceftriaxone at this time and de-escalate post surgery Rest planning as per surgery Nausea this a.m.-significant constipation No stool since admission-changed to sorbitol If no improvement give fleets enema ESRD As per nephrology Anemia of renal disease [name] as per nephrology - avoid IV iron c inflammation/infection-continue Auryxia 630 3 times daily Typed and screened?  Transfusing today per Ortho DM TY 2 with nephropathy neuropathy Uses continuous monitor at home Resume Lantus on discharge-CBG 169-179 eating 75% of meals but still has lows in the mornings Ensure nighttime snack Anxiety Continue Lexapro 10, Klonopin 5 twice daily  DVT prophylaxis: Heparin Code Status: Full Family Communication: None present at the bedside at this time I discussed the plan of care with his  son-in-law on 7/6 Dr. Ninfa Linden Disposition:  Status is: Inpatient  Remains inpatient appropriate because:Hemodynamically unstable and Unsafe d/c plan  Dispo: The patient is from: Home              Anticipated d/c is to: SNF              Patient currently is not medically stable to d/c.   Difficult to place patient No       Consultants:  Vascular surgery Dr. Oneida Alar Orthopedic surgery Dr. Sharol Given  Procedures:   None yet  Antimicrobials:   Vancomycin, cefepime, Flagyl since admission   Subjective:  Pain seems better controlled Has occasional nausea on rising-I am not sure if this is secondary to orthostasis? He has had a full breakfast and does not complain of any pain states it is 2/10 He has no fever no chills  It looks like he is being transfused today?  Objective: Vitals:   05/10/21 1831 05/10/21 2048 05/11/21 0511 05/11/21 0944  BP: 105/66 119/68 123/72 (!) 96/57  Pulse: 100 97 97 88  Resp: 17 20 18 17   Temp: 98 F (36.7 C) 98.5 F (36.9 C) 97.9 F (36.6 C) 97.9 F (36.6 C)  TempSrc:  Oral Oral   SpO2: 90% 94% 97% 98%  Weight:   88.3 kg     Intake/Output Summary (Last 24 hours) at 05/11/2021 1004 Last data filed at 05/11/2021 0800 Gross per 24 hour  Intake 2078.92 ml  Output 1500 ml  Net 578.92 ml    Filed Weights   05/10/21 0815 05/10/21 1120 05/11/21 0511  Weight: 88 kg 86.5 kg 88.3 kg    Examination:  Coherent pleasant no distress EOMI NCAT slightly flat affect no distress No icterus no pallor Abdomen soft no rebound no guarding no epigastric tenderness Wound not examined  Data Reviewed: personally reviewed   CBC    Component Value Date/Time   WBC 11.3 (H) 05/08/2021 1232   RBC 2.58 (L) 05/10/2021 1502   RBC 2.65 (L) 05/08/2021 1232   HGB 8.2 (L) 05/08/2021 1232   HCT 26.4 (L) 05/08/2021 1232   PLT 149 (L) 05/08/2021 1232   MCV 99.6 05/08/2021 1232   MCH 30.9 05/08/2021 1232   MCHC 31.1 05/08/2021 1232   RDW 16.1 (H) 05/08/2021  1232   LYMPHSABS 1.3 05/07/2021 1709   MONOABS 0.9 05/07/2021 1709   EOSABS 0.0 05/07/2021 1709   BASOSABS 0.0 05/07/2021 1709   CMP Latest Ref Rng & Units 05/08/2021 05/07/2021 04/20/2021  Glucose 70 - 99 mg/dL 221(H) 185(H) 195(H)  BUN 8 - 23 mg/dL 61(H) 57(H) 31(H)  Creatinine 0.61 - 1.24 mg/dL 6.75(H) 6.26(H) 5.42(H)  Sodium 135 - 145 mmol/L 134(L) 134(L) 135  Potassium 3.5 - 5.1 mmol/L 4.4 4.1 4.1  Chloride 98 - 111 mmol/L 93(L) 92(L) 98  CO2 22 - 32 mmol/L 26 28 26   Calcium 8.9 - 10.3 mg/dL 8.3(L) 8.9 8.1(L)  Total Protein 6.5 - 8.1 g/dL 5.8(L) 6.8 -  Total Bilirubin 0.3 - 1.2 mg/dL 1.1 1.3(H) -  Alkaline Phos 38 - 126 U/L 98 121 -  AST 15 - 41 U/L 25 29 -  ALT 0 - 44 U/L 19 24 -     Radiology Studies: VAS [place] LOWER EXTREMITY ARTERIAL DUPLEX  Result Date: 05/09/2021 LOWER EXTREMITY ARTERIAL DUPLEX STUDY Patient Name:  Maxwell Harmon  Date of Exam:   05/09/2021 Medical Rec #: [id number]        Accession #:    [id number] Date of Birth: [birth year]        Patient Gender: M Patient Age:   17Y [facility] Procedure:      VAS [place] LOWER EXTREMITY ARTERIAL DUPLEX Referring Phys: [place] --------------------------------------------------------------------------------  Indications: Peripheral artery disease, and Prognosis for BKA, s/p Lt popliteal              artery intravascular lithotripsy and angioplasty, PTA angioplasty              on 04-19-2021.  Current ABI: N/A Comparison Study: 04-14-2021 Abdominal aortogram showed left popliteal artery                   occlusion, below-knee popliteal calcified but patent, pero A                   and ATA occluded, stenosis of the PTA.                    ABI w/ TBI 04-11-2021 Non-compressible bilaterally, RT TBI 29                   LT TBI 0. Performing Technologist: [name] RDMS,RVT  Examination Guidelines: A complete evaluation includes B-mode imaging, spectral Doppler, color Doppler, and power Doppler as needed of  all accessible portions of each vessel. Bilateral testing is considered an integral part of a complete examination. Limited examinations for reoccurring indications may be performed as noted.   +-----------+--------+-----+--------------+----------------+-------------------+ LEFT       PSV cm/sRatioStenosis      Waveform        Comments            +-----------+--------+-----+--------------+----------------+-------------------+ CFA Prox   73  biphasic                            +-----------+--------+-----+--------------+----------------+-------------------+ CFA Mid    61                         biphasic                            +-----------+--------+-----+--------------+----------------+-------------------+ CFA Distal 60                         biphasic                            +-----------+--------+-----+--------------+----------------+-------------------+ DFA        46                         biphasic                            +-----------+--------+-----+--------------+----------------+-------------------+ SFA Prox   51                         biphasic                            +-----------+--------+-----+--------------+----------------+-------------------+ SFA Mid    40                         monophasic                          +-----------+--------+-----+--------------+----------------+-------------------+ SFA Distal 66                         monophasic                          +-----------+--------+-----+--------------+----------------+-------------------+ POP Prox   125                        monophasic                          +-----------+--------+-----+--------------+----------------+-------------------+ POP Mid    413          75-99%        monophasic      Post-stenotic                               stenosis                      turbulence           +-----------+--------+-----+--------------+----------------+-------------------+ POP Distal 47                         monophasic                          +-----------+--------+-----+--------------+----------------+-------------------+ TP Trunk   23  monophasic                          +-----------+--------+-----+--------------+----------------+-------------------+ ATA Mid    14                         dampened                                                                  monophasic                          +-----------+--------+-----+--------------+----------------+-------------------+ ATA Distal              occluded                                          +-----------+--------+-----+--------------+----------------+-------------------+ PTA Prox   75                                                             +-----------+--------+-----+--------------+----------------+-------------------+ PTA Mid    34                                                             +-----------+--------+-----+--------------+----------------+-------------------+ PTA Distal 47                                                             +-----------+--------+-----+--------------+----------------+-------------------+ PERO Distal             occluded                                          +-----------+--------+-----+--------------+----------------+-------------------+  Summary: Left: 75-99% stenosis noted in the popliteal artery.  See table(s) above for measurements and observations. Electronically signed by Ruta Hinds MD on 05/09/2021 at 3:50:08 PM.    Final      Scheduled Meds:  sodium chloride   Intravenous Once   aspirin EC  81 mg Oral Daily   atorvastatin  80 mg Oral Daily   calcitRIOL  0.25 mcg Oral BID   Chlorhexidine Gluconate Cloth  6 each Topical Q0600   clopidogrel  75 mg Oral Daily   darbepoetin ([name]) injection - DIALYSIS   150 mcg Intravenous Q Wed-HD   escitalopram  10 mg Oral Daily   ferric citrate  630 mg Oral TID   heparin  5,000 Units Subcutaneous Q8H   insulin aspart  0-6 Units Subcutaneous TID  WC   pentoxifylline  400 mg Oral TID WC   sodium chloride flush  3 mL Intravenous Q12H   sorbitol  30 mL Oral Daily   tamsulosin  0.4 mg Oral QHS   Continuous Infusions:  cefTRIAXone (ROCEPHIN)  IV 2 g (05/10/21 2200)   metronidazole 500 mg (05/11/21 0888)   vancomycin 750 mg (05/08/21 1645)     LOS: 3 days   Time spent: 10  Nita Sells, MD [facility] To contact the attending provider between 7A-7P or the covering provider during after hours 7P-7A, please log into the web site [email or web address] and access using universal [place] password for that web site. If you do not have the password, please call the hospital operator.  05/11/2021, 10:04 AM

## 2021-05-12 ENCOUNTER — Encounter (HOSPITAL_COMMUNITY): Payer: Self-pay | Admitting: Internal Medicine

## 2021-05-12 ENCOUNTER — Encounter (HOSPITAL_COMMUNITY)
Admission: EM | Disposition: A | Discharge status: Rehabilitation Facility | Payer: Self-pay | Source: Home / Self Care | Attending: Family Medicine

## 2021-05-12 ENCOUNTER — Inpatient Hospital Stay (HOSPITAL_COMMUNITY): Payer: Managed Care, Other (non HMO) | Admitting: Anesthesiology

## 2021-05-12 DIAGNOSIS — Z992 Dependence on renal dialysis: Secondary | ICD-10-CM | POA: Diagnosis not present

## 2021-05-12 DIAGNOSIS — E11621 Type 2 diabetes mellitus with foot ulcer: Secondary | ICD-10-CM | POA: Diagnosis not present

## 2021-05-12 DIAGNOSIS — I739 Peripheral vascular disease, unspecified: Secondary | ICD-10-CM | POA: Diagnosis not present

## 2021-05-12 DIAGNOSIS — L03116 Cellulitis of left lower limb: Secondary | ICD-10-CM | POA: Diagnosis not present

## 2021-05-12 HISTORY — PX: AMPUTATION: SHX166

## 2021-05-12 LAB — COMPREHENSIVE METABOLIC PANEL
ALT: 13 U/L (ref 0–44)
AST: 16 U/L (ref 15–41)
Albumin: 1.9 g/dL — ABNORMAL LOW (ref 3.5–5.0)
Alkaline Phosphatase: 115 U/L (ref 38–126)
Anion gap: 7 (ref 5–15)
BUN: 54 mg/dL — ABNORMAL HIGH (ref 8–23)
CO2: 30 mmol/L (ref 22–32)
Calcium: 8.4 mg/dL — ABNORMAL LOW (ref 8.9–10.3)
Chloride: 93 mmol/L — ABNORMAL LOW (ref 98–111)
Creatinine, Ser: 5.77 mg/dL — ABNORMAL HIGH (ref 0.61–1.24)
GFR, Estimated: 10 mL/min — ABNORMAL LOW (ref >60)
Glucose, Bld: 183 mg/dL — ABNORMAL HIGH (ref 70–99)
Potassium: 3.9 mmol/L (ref 3.5–5.1)
Sodium: 130 mmol/L — ABNORMAL LOW (ref 135–145)
Total Bilirubin: 1.2 mg/dL (ref 0.3–1.2)
Total Protein: 5.4 g/dL — ABNORMAL LOW (ref 6.5–8.1)

## 2021-05-12 LAB — TYPE AND SCREEN
ABO/RH(D): O NEG
Antibody Screen: NEGATIVE
Unit division: 0

## 2021-05-12 LAB — CBC WITH DIFFERENTIAL/PLATELET
Abs Immature Granulocytes: 0.1 10*3/uL — ABNORMAL HIGH (ref 0.00–0.07)
Basophils Absolute: 0 10*3/uL (ref 0.0–0.1)
Basophils Relative: 0 %
Eosinophils Absolute: 0.1 10*3/uL (ref 0.0–0.5)
Eosinophils Relative: 0 %
HCT: 26.2 % — ABNORMAL LOW (ref 39.0–52.0)
Hemoglobin: 8.3 g/dL — ABNORMAL LOW (ref 13.0–17.0)
Immature Granulocytes: 1 %
Lymphocytes Relative: 9 %
Lymphs Abs: 1.3 10*3/uL (ref 0.7–4.0)
MCH: 30.7 pg (ref 26.0–34.0)
MCHC: 31.7 g/dL (ref 30.0–36.0)
MCV: 97 fL (ref 80.0–100.0)
Monocytes Absolute: 0.9 10*3/uL (ref 0.1–1.0)
Monocytes Relative: 6 %
Neutro Abs: 11.9 10*3/uL — ABNORMAL HIGH (ref 1.7–7.7)
Neutrophils Relative %: 84 %
Platelets: 186 10*3/uL (ref 150–400)
RBC: 2.7 MIL/uL — ABNORMAL LOW (ref 4.22–5.81)
RDW: 16.3 % — ABNORMAL HIGH (ref 11.5–15.5)
WBC: 14.3 10*3/uL — ABNORMAL HIGH (ref 4.0–10.5)
nRBC: 0 % (ref 0.0–0.2)

## 2021-05-12 LAB — GLUCOSE, CAPILLARY
Glucose-Capillary: 151 mg/dL — ABNORMAL HIGH (ref 70–99)
Glucose-Capillary: 147 mg/dL — ABNORMAL HIGH (ref 70–99)
Glucose-Capillary: 163 mg/dL — ABNORMAL HIGH (ref 70–99)
Glucose-Capillary: 152 mg/dL — ABNORMAL HIGH (ref 70–99)
Glucose-Capillary: 267 mg/dL — ABNORMAL HIGH (ref 70–99)

## 2021-05-12 LAB — BPAM RBC
Blood Product Expiration Date: 202207112359
ISSUE DATE / TIME: 202207071058
Unit Type and Rh: 9500

## 2021-05-12 LAB — SURGICAL PCR SCREEN
MRSA, PCR: NEGATIVE
Staphylococcus aureus: NEGATIVE

## 2021-05-12 LAB — CULTURE, BLOOD (ROUTINE X 2)
Culture: NO GROWTH
Culture: NO GROWTH

## 2021-05-12 SURGERY — AMPUTATION BELOW KNEE
Anesthesia: Monitor Anesthesia Care | Site: Knee | Laterality: Left

## 2021-05-12 MED ORDER — BUPIVACAINE LIPOSOME 1.3 % IJ SUSP
INTRAMUSCULAR | Status: DC | PRN
  Administered 2021-05-12: 10 mL

## 2021-05-12 MED ORDER — FENTANYL CITRATE (PF) 100 MCG/2ML IJ SOLN
INTRAMUSCULAR | Status: AC
  Administered 2021-05-12: 100 ug via INTRAVENOUS
  Filled 2021-05-12: qty 2

## 2021-05-12 MED ORDER — PHENYLEPHRINE 40 MCG/ML (10ML) SYRINGE FOR IV PUSH (FOR BLOOD PRESSURE SUPPORT)
PREFILLED_SYRINGE | INTRAVENOUS | Status: DC | PRN
  Administered 2021-05-12: 120 ug via INTRAVENOUS
  Administered 2021-05-12: 80 ug via INTRAVENOUS
  Administered 2021-05-12: 120 ug via INTRAVENOUS

## 2021-05-12 MED ORDER — ALBUMIN HUMAN 5 % IV SOLN: INTRAVENOUS | Status: DC | PRN

## 2021-05-12 MED ORDER — PROPOFOL 500 MG/50ML IV EMUL
INTRAVENOUS | Status: DC | PRN
  Administered 2021-05-12: 40 ug/kg/min via INTRAVENOUS

## 2021-05-12 MED ORDER — MAGNESIUM CITRATE PO SOLN: 1.0000 | Freq: Once | ORAL | Status: DC | PRN

## 2021-05-12 MED ORDER — ONDANSETRON HCL 4 MG/2ML IJ SOLN: 4.0000 mg | Freq: Once | INTRAMUSCULAR | Status: DC | PRN

## 2021-05-12 MED ORDER — FENTANYL CITRATE (PF) 100 MCG/2ML IJ SOLN: 25.0000 ug | INTRAMUSCULAR | Status: DC | PRN

## 2021-05-12 MED ORDER — PANTOPRAZOLE SODIUM 40 MG PO TBEC
40.0000 mg | DELAYED_RELEASE_TABLET | Freq: Every day | ORAL | Status: DC
  Administered 2021-05-13 – 2021-05-18 (×6): 40 mg via ORAL
  Filled 2021-05-12 (×6): qty 1

## 2021-05-12 MED ORDER — 0.9 % SODIUM CHLORIDE (POUR BTL) OPTIME
TOPICAL | Status: DC | PRN
  Administered 2021-05-12: 1000 mL

## 2021-05-12 MED ORDER — PHENOL 1.4 % MT LIQD: 1.0000 | OROMUCOSAL | Status: DC | PRN

## 2021-05-12 MED ORDER — SODIUM CHLORIDE 0.9 % IV SOLN: INTRAVENOUS | Status: DC

## 2021-05-12 MED ORDER — TRANEXAMIC ACID-NACL 1000-0.7 MG/100ML-% IV SOLN
1000.0000 mg | Freq: Once | INTRAVENOUS | Status: AC
  Administered 2021-05-12: 1000 mg via INTRAVENOUS
  Filled 2021-05-12: qty 100

## 2021-05-12 MED ORDER — FENTANYL CITRATE (PF) 250 MCG/5ML IJ SOLN
INTRAMUSCULAR | Status: AC
  Filled 2021-05-12: qty 5

## 2021-05-12 MED ORDER — MIDAZOLAM HCL 2 MG/2ML IJ SOLN
INTRAMUSCULAR | Status: AC
  Administered 2021-05-12: 2 mg via INTRAVENOUS
  Filled 2021-05-12: qty 2

## 2021-05-12 MED ORDER — OXYCODONE HCL 5 MG PO TABS: 5.0000 mg | ORAL_TABLET | Freq: Once | ORAL | Status: DC | PRN

## 2021-05-12 MED ORDER — POLYETHYLENE GLYCOL 3350 17 G PO PACK
17.0000 g | PACK | Freq: Every day | ORAL | Status: DC | PRN
  Administered 2021-05-14: 17 g via ORAL
  Filled 2021-05-12: qty 1

## 2021-05-12 MED ORDER — MIDAZOLAM HCL 2 MG/2ML IJ SOLN: 2.0000 mg | Freq: Once | INTRAMUSCULAR | Status: AC

## 2021-05-12 MED ORDER — CEFAZOLIN SODIUM-DEXTROSE 2-4 GM/100ML-% IV SOLN
2.0000 g | INTRAVENOUS | Status: AC
  Administered 2021-05-12: 2 g via INTRAVENOUS

## 2021-05-12 MED ORDER — BISACODYL 5 MG PO TBEC: 5.0000 mg | DELAYED_RELEASE_TABLET | Freq: Every day | ORAL | Status: DC | PRN

## 2021-05-12 MED ORDER — ZINC SULFATE 220 (50 ZN) MG PO CAPS
220.0000 mg | ORAL_CAPSULE | Freq: Every day | ORAL | Status: DC
  Administered 2021-05-13 – 2021-05-18 (×6): 220 mg via ORAL
  Filled 2021-05-12 (×6): qty 1

## 2021-05-12 MED ORDER — LIDOCAINE 2% (20 MG/ML) 5 ML SYRINGE
INTRAMUSCULAR | Status: DC | PRN
  Administered 2021-05-12: 40 mg via INTRAVENOUS

## 2021-05-12 MED ORDER — FENTANYL CITRATE (PF) 100 MCG/2ML IJ SOLN: 100.0000 ug | Freq: Once | INTRAMUSCULAR | Status: AC

## 2021-05-12 MED ORDER — OXYCODONE HCL 5 MG PO TABS
5.0000 mg | ORAL_TABLET | ORAL | Status: DC | PRN
  Administered 2021-05-14 – 2021-05-15 (×2): 10 mg via ORAL
  Filled 2021-05-12 (×2): qty 2

## 2021-05-12 MED ORDER — CALCITRIOL 0.25 MCG PO CAPS
ORAL_CAPSULE | ORAL | Status: AC
  Administered 2021-05-12: 0.25 ug via ORAL
  Filled 2021-05-12: qty 1

## 2021-05-12 MED ORDER — POTASSIUM CHLORIDE CRYS ER 20 MEQ PO TBCR: 20.0000 meq | EXTENDED_RELEASE_TABLET | Freq: Every day | ORAL | Status: DC | PRN

## 2021-05-12 MED ORDER — VANCOMYCIN HCL IN DEXTROSE 1-5 GM/200ML-% IV SOLN
INTRAVENOUS | Status: AC
  Administered 2021-05-12: 1000 mg via INTRAVENOUS
  Filled 2021-05-12: qty 200

## 2021-05-12 MED ORDER — CHLORHEXIDINE GLUCONATE 0.12 % MT SOLN
OROMUCOSAL | Status: AC
  Filled 2021-05-12: qty 15

## 2021-05-12 MED ORDER — HYDROMORPHONE HCL 1 MG/ML IJ SOLN
0.5000 mg | INTRAMUSCULAR | Status: DC | PRN
  Administered 2021-05-13 – 2021-05-14 (×3): 0.5 mg via INTRAVENOUS
  Filled 2021-05-12 (×3): qty 1

## 2021-05-12 MED ORDER — SODIUM CHLORIDE 0.9 % IV SOLN: INTRAVENOUS | Status: DC | PRN

## 2021-05-12 MED ORDER — CEFAZOLIN SODIUM-DEXTROSE 2-4 GM/100ML-% IV SOLN
INTRAVENOUS | Status: AC
  Filled 2021-05-12: qty 100

## 2021-05-12 MED ORDER — ALUM & MAG HYDROXIDE-SIMETH 200-200-20 MG/5ML PO SUSP: 15.0000 mL | ORAL | Status: DC | PRN

## 2021-05-12 MED ORDER — JUVEN PO PACK
1.0000 | PACK | Freq: Two times a day (BID) | ORAL | Status: DC
  Administered 2021-05-12 – 2021-05-18 (×11): 1 via ORAL
  Filled 2021-05-12 (×12): qty 1

## 2021-05-12 MED ORDER — EPHEDRINE SULFATE 50 MG/ML IJ SOLN
INTRAMUSCULAR | Status: DC | PRN
  Administered 2021-05-12 (×2): 10 mg via INTRAVENOUS

## 2021-05-12 MED ORDER — OXYCODONE HCL 5 MG/5ML PO SOLN: 5.0000 mg | Freq: Once | ORAL | Status: DC | PRN

## 2021-05-12 MED ORDER — GUAIFENESIN-DM 100-10 MG/5ML PO SYRP
15.0000 mL | ORAL_SOLUTION | ORAL | Status: DC | PRN
  Administered 2021-05-13 – 2021-05-16 (×3): 15 mL via ORAL
  Filled 2021-05-12 (×5): qty 15

## 2021-05-12 MED ORDER — BUPIVACAINE HCL (PF) 0.5 % IJ SOLN
INTRAMUSCULAR | Status: DC | PRN
  Administered 2021-05-12 (×2): 15 mL via PERINEURAL

## 2021-05-12 MED ORDER — DOCUSATE SODIUM 100 MG PO CAPS
100.0000 mg | ORAL_CAPSULE | Freq: Every day | ORAL | Status: DC
  Administered 2021-05-13 – 2021-05-14 (×2): 100 mg via ORAL
  Filled 2021-05-12 (×2): qty 1

## 2021-05-12 MED ORDER — HEPARIN SODIUM (PORCINE) 5000 UNIT/ML IJ SOLN
5000.0000 [IU] | Freq: Three times a day (TID) | INTRAMUSCULAR | Status: DC
  Administered 2021-05-13 – 2021-05-18 (×17): 5000 [IU] via SUBCUTANEOUS
  Filled 2021-05-12 (×15): qty 1

## 2021-05-12 SURGICAL SUPPLY — 39 items
BAG COUNTER SPONGE SURGICOUNT (BAG) IMPLANT
BAG SPNG CNTER NS LX DISP (BAG)
BLADE SAW RECIP 87.9 MT (BLADE) ×2 IMPLANT
BLADE SURG 21 STRL SS (BLADE) ×2 IMPLANT
BNDG COHESIVE 6X5 TAN STRL LF (GAUZE/BANDAGES/DRESSINGS) IMPLANT
CANISTER WOUND CARE 500ML ATS (WOUND CARE) ×3 IMPLANT
COVER SURGICAL LIGHT HANDLE (MISCELLANEOUS) ×2 IMPLANT
CUFF TOURN SGL QUICK 34 (TOURNIQUET CUFF) ×2
CUFF TRNQT CYL 34X4.125X (TOURNIQUET CUFF) ×1 IMPLANT
DRAPE DERMATAC (DRAPES) ×1 IMPLANT
DRAPE INCISE IOBAN 66X45 STRL (DRAPES) ×2 IMPLANT
DRAPE U-SHAPE 47X51 STRL (DRAPES) ×2 IMPLANT
DRESSING PREVENA PLUS CUSTOM (GAUZE/BANDAGES/DRESSINGS) ×1 IMPLANT
DRSG PREVENA PLUS CUSTOM (GAUZE/BANDAGES/DRESSINGS) ×2
DURAPREP 26ML APPLICATOR (WOUND CARE) ×2 IMPLANT
ELECT REM PT RETURN 9FT ADLT (ELECTROSURGICAL) ×2
ELECTRODE REM PT RTRN 9FT ADLT (ELECTROSURGICAL) ×1 IMPLANT
GLOVE SURG ORTHO LTX SZ9 (GLOVE) ×2 IMPLANT
GLOVE SURG UNDER POLY LF SZ9 (GLOVE) ×2 IMPLANT
GOWN STRL REUS W/ TWL XL LVL3 (GOWN DISPOSABLE) ×2 IMPLANT
GOWN STRL REUS W/TWL XL LVL3 (GOWN DISPOSABLE) ×4
KIT BASIN OR (CUSTOM PROCEDURE TRAY) ×2 IMPLANT
KIT TURNOVER KIT B (KITS) ×2 IMPLANT
MANIFOLD NEPTUNE II (INSTRUMENTS) ×2 IMPLANT
NS IRRIG 1000ML POUR BTL (IV SOLUTION) ×2 IMPLANT
PACK ORTHO EXTREMITY (CUSTOM PROCEDURE TRAY) ×2 IMPLANT
PAD ARMBOARD 7.5X6 YLW CONV (MISCELLANEOUS) ×2 IMPLANT
PREVENA RESTOR ARTHOFORM 46X30 (CANNISTER) ×2 IMPLANT
PREVENA RESTOR AXIOFORM 29X28 (GAUZE/BANDAGES/DRESSINGS) ×1 IMPLANT
SPONGE T-LAP 18X18 ~~LOC~~+RFID (SPONGE) ×1 IMPLANT
STAPLER VISISTAT 35W (STAPLE) ×1 IMPLANT
STOCKINETTE IMPERVIOUS LG (DRAPES) ×2 IMPLANT
SUT ETHILON 2 0 PSLX (SUTURE) IMPLANT
SUT SILK 2 0 (SUTURE) ×2
SUT SILK 2-0 18XBRD TIE 12 (SUTURE) ×1 IMPLANT
SUT VIC AB 1 CTX 27 (SUTURE) ×4 IMPLANT
TOWEL GREEN STERILE (TOWEL DISPOSABLE) ×2 IMPLANT
TUBE CONNECTING 12X1/4 (SUCTIONS) ×2 IMPLANT
YANKAUER SUCT BULB TIP NO VENT (SUCTIONS) ×2 IMPLANT

## 2021-05-12 NOTE — Progress Notes (Signed)
[facility] Progress Note    Assessment/ Plan:   ESRD: had been on HD due to issues with CCPD: inadequate drainage/UF (catheter malfunction?) Outpatient orders: [facility], MWF, F180, 3hrs, 2k, 2.5cal, 137 na, 35bicarb, 400bfr, 16g, edw 86.5kg. hep 2k bous -discussed with his outpatient home therapies RN 7/5: no plans for further PD and is being scheduled to have PD cath removed as an outpatient, therefore no PD cath flushing needed. -next HD today on 7/8, maintaining mwf schedule   Cellulitis/diabetic foot, progressive gangrene -abx per primary service, ortho and vvs consulted. Left transtibial amputation 7/8   Volume/ hypertension: EDW 86.5kg. Will UF as tolerated   Anemia of Chronic Kidney Disease: Hemoglobin 8.2. Avoiding IV iron for now, receives mircera 150 mct, last dose 6/24, ordered aranesp here. 1u prbc today per surgery   Secondary Hyperparathyroidism/Hyperphosphatemia: continue with home meds    Vascular access: AVF with good B/T. PD cath in place   DM2 w/ hyperglycemia -mgmt per primary service   # Additional recommendations: - Dose all meds for creatinine clearance < 10 ml/min  - Unless absolutely necessary, no MRIs with gadolinium.  - Implement save arm precautions.  Prefer needle sticks in the dorsum of the hands or wrists.  No blood pressure measurements in arm. - If blood transfusion is requested during hemodialysis sessions, please alert [place] prior to the session. - If a hemodialysis catheter line culture is requested, please alert [place] as only hemodialysis nurses are able to collect those specimens.    Gean Quint, MD [facility]  Subjective:   Seen and examined bedside. S/p amputation this am, not in pain at the moment. Endorsing hunger.   Objective:   BP 121/71   Pulse 96   Temp 98 F (36.7 C) (Oral)   Resp 18   Ht 5\' 11"  (1.803 m)   Wt 89 kg   SpO2 90%   BMI 27.37 kg/m   Intake/Output Summary (Last 24 hours) at  05/12/2021 1256 Last data filed at 05/12/2021 1023 Gross per 24 hour  Intake 1371 ml  Output 75 ml  Net 1296 ml   Weight change: 1 kg  Physical Exam: Gen:nad, nontoxic appearing CVS:rrr Resp:normal wob, no acc muscle use, speaking in full sentencess Abd:pd cath in place, soft, nt/nd Ext:no edema, LLE amputated Neuro: awake, alert, speech clear and coherent, moves all ext spontaneously Access: avf +b/t  Imaging: No results found.  Labs: BMET Recent Labs  Lab 05/07/21 1709 05/08/21 0120 05/12/21 0343  NA 134* 134* 130*  K 4.1 4.4 3.9  CL 92* 93* 93*  CO2 28 26 30   GLUCOSE 185* 221* 183*  BUN 57* 61* 54*  CREATININE 6.26* 6.75* 5.77*  CALCIUM 8.9 8.3* 8.4*   CBC Recent Labs  Lab 05/07/21 1709 05/08/21 0120 05/08/21 1232 05/11/21 1628 05/12/21 0343  WBC 13.4* 11.7* 11.3*  --  14.3*  NEUTROABS 11.1*  --   --   --  11.9*  HGB 8.5* 7.8* 8.2* 10.3* 8.3*  HCT 27.3* 25.0* 26.4* 33.1* 26.2*  MCV 101.1* 100.0 99.6  --  97.0  PLT 174 149* 149*  --  186    Medications:     aspirin EC  81 mg Oral Daily   atorvastatin  80 mg Oral Daily   calcitRIOL  0.25 mcg Oral BID   chlorhexidine       Chlorhexidine Gluconate Cloth  6 each Topical Q0600   clopidogrel  75 mg Oral Daily   darbepoetin ([name])  injection - DIALYSIS  150 mcg Intravenous Q Wed-HD   [START ON 05/13/2021] docusate sodium  100 mg Oral Daily   escitalopram  10 mg Oral Daily   ferric citrate  630 mg Oral TID   [START ON 05/13/2021] heparin  5,000 Units Subcutaneous Q8H   insulin aspart  0-6 Units Subcutaneous TID WC   nutrition supplement (JUVEN)  1 packet Oral BID BM   pantoprazole  40 mg Oral Daily   pentoxifylline  400 mg Oral TID WC   sodium chloride flush  3 mL Intravenous Q12H   sorbitol  30 mL Oral Daily   tamsulosin  0.4 mg Oral QHS   zinc sulfate  220 mg Oral Daily      Gean Quint, MD [facility] 05/12/2021, 12:56 PM

## 2021-05-12 NOTE — Op Note (Signed)
   Date of Surgery: 05/12/2021  INDICATIONS: Maxwell Harmon is a 65 y.o.-year-old male who has ESRD on dialysis with diabetes and gangrenous left heel ulcer without improvement after revascularization.  PREOPERATIVE DIAGNOSIS: gangrenous left heel ulcer  POSTOPERATIVE DIAGNOSIS: Same.  PROCEDURE: Transtibial amputation Application of Prevena wound VAC  SURGEON: Sharol Given, M.D.  ANESTHESIA:  general  IV FLUIDS AND URINE: See anesthesia records.  ESTIMATED BLOOD LOSS: See anesthesia records.  COMPLICATIONS: None.  DESCRIPTION OF PROCEDURE: The patient was brought to the operating room after undergoing regional anesthetic. After adequate levels of anesthesia were obtained patient's lower extremity was prepped using DuraPrep draped into a sterile field. A timeout was called. The foot was draped out of the sterile field with impervious stockinette. A transverse incision was made 11 cm distal to the tibial tubercle. This curved proximally and a large posterior flap was created. The tibia was transected 1 cm proximal to the skin incision. The fibula was transected just proximal to the tibial incision. The tibia was beveled anteriorly. A large posterior flap was created. The sciatic nerve was pulled cut and allowed to retract. The vascular bundles were suture ligated with 2-0 silk. The deep and superficial fascial layers were closed using #1 Vicryl. The skin was closed using staples and 2-0 nylon. The wound was covered with a Prevena customizable and arthroform wound VAC.  The dressing was sealed with dermatac there was a good suction fit. A prosthetic shrinker and limb protector were applied. Patient was taken to the PACU in stable condition.   DISCHARGE PLANNING:  Antibiotic duration: 24 hours  Weightbearing: Nonweightbearing on the operative extremity  Pain medication: Opioid pathway  Dressing care/ Wound VAC: Continue wound VAC for 1 week after discharge  Discharge to: Discharge planning based on  therapy's recommendations for possible inpatient rehabilitation, outpatient rehabilitation, or discharge to home with therapy  Follow-up: In the office 1 week post operative.  Meridee Score, MD [place] 9:59 AM

## 2021-05-12 NOTE — Anesthesia Postprocedure Evaluation (Signed)
Anesthesia Post Note  Patient: Maxwell Harmon  Procedure(s) Performed: LEFT BELOW KNEE AMPUTATION (Left: Knee)     Patient location during evaluation: PACU Anesthesia Type: Regional Level of consciousness: awake and alert Pain management: pain level controlled Vital Signs Assessment: post-procedure vital signs reviewed and stable Respiratory status: spontaneous breathing, nonlabored ventilation and respiratory function stable Cardiovascular status: blood pressure returned to baseline and stable Postop Assessment: no apparent nausea or vomiting Anesthetic complications: no   No notable events documented.  Last Vitals:  Vitals:   05/12/21 1023 05/12/21 1039  BP: 117/70 121/71  Pulse: 95 96  Resp: 20 18  Temp: 36.4 C 36.7 C  SpO2: 96% 90%    Last Pain:  Vitals:   05/12/21 1039  TempSrc: Oral  PainSc:                  Lidia Collum

## 2021-05-12 NOTE — Anesthesia Procedure Notes (Signed)
Anesthesia Regional Block: Adductor canal block   Pre-Anesthetic Checklist: , timeout performed,  Correct Patient, Correct Site, Correct Laterality,  Correct Procedure, Correct Position, site marked,  Risks and benefits discussed,  Surgical consent,  Pre-op evaluation,  At surgeon's request and post-op pain management  Laterality: Left  Prep: chloraprep       Needles:  Injection technique: Single-shot  Needle Type: Echogenic Stimulator Needle     Needle Length: 10cm  Needle Gauge: 20     Additional Needles:   Procedures:,,,, ultrasound used (permanent image in chart),,    Narrative:  Start time: 05/12/2021 8:37 AM End time: 05/12/2021 8:41 AM Injection made incrementally with aspirations every 5 mL.  Performed by: Personally  Anesthesiologist: Lidia Collum, MD  Additional Notes: Standard monitors applied. Skin prepped. Good needle visualization with ultrasound. Injection made in 5cc increments with no resistance to injection. Patient tolerated the procedure well.

## 2021-05-12 NOTE — Progress Notes (Signed)
PROGRESS NOTE   Maxwell Harmon  WVP:[id number] DOB: [birth year] DOA: 05/07/2021 PCP: [name], DO  Brief Narrative:  65 year old white male CAD + CABG 2021 Charcot foot ESRD (prior malignancy status post nephrectomy) previously on peritoneal dialysis Chronic L LE diabetic wound previously followed by podiatry >5 months/cellulitis 04/19/2021 had angiogram with revascularization left popliteal, left posterior tibial artery with ICU stay 04/2021--discharged on antibiotics--underwent cath that time with no options for revascularization  Readmit 05/07/2021 worsening pain discharge erythema Lactic acid normal COVID-negative urinalysis negative Left foot x-ray showed no evidence of osteomyelitis  Orthopedic surgeon Dr. Sharol Given consulted Vascular surgeon Dr. 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 Sharol Given Doppler study=75 to 99% stenosed popliteal artery left side Trental for rest pain 400 3 times daily--continue hydrocodone 1-2 tab every 4 as needed p vancomycin Flagyl ceftriaxone at this time and de-escalate post surgery Post op wght bear, dvt proph etc as per ortho Leukocytosis-persistent Probably secondary to diabetic foot wound-follow trend Nausea this a.m.-significant constipation Sorbitol, fleets enema ordered ESRD As per nephrology Anemia of renal disease [name] as per nephrology - avoid IV iron c inflammation/infection-continue Auryxia 630 3 times daily 1 unit PRBC transfused 7/7 per Ortho--hb acceptable DM TY 2 with nephropathy neuropathy Uses continuous monitor at home Resume Lantus on discharge-CBG 169-179 eating 50%-hypoglycemia resolved with nighttime snack Anxiety-situational Continue Lexapro 10, Klonopin 5 twice daily  DVT prophylaxis: Heparin Code Status: Full Family Communication: None present at the bedside at this time I discussed the plan of care with his son-in-law on  7/7 Dr. Ninfa Linden Disposition:  Status is: Inpatient  Remains inpatient appropriate because:Hemodynamically unstable and Unsafe d/c plan  Dispo: The patient is from: Home              Anticipated d/c is to: SNF              Patient currently is not medically stable to d/c.   Difficult to place patient No     Consultants:  Vascular surgery Dr. Oneida Alar Orthopedic surgery Dr. Sharol Given  Procedures:   7.8.22-BKA Dr. Sharol Given  Antimicrobials:   Vancomycin, cefepime, Flagyl since admission   Subjective:  Some tingling post-procedure no distress eatign drinkign tol dietno cp no fever  A little sleepy   Objective: Vitals:   05/11/21 1813 05/11/21 2111 05/12/21 0130 05/12/21 0510  BP: 98/61 115/72  121/73  Pulse: 89 90  92  Resp: 16 16  18   Temp: 98 F (36.7 C) 99 F (37.2 C)  97.8 F (36.6 C)  TempSrc:  Oral  Oral  SpO2: 100% 92%  97%  Weight:   89 kg     Intake/Output Summary (Last 24 hours) at 05/12/2021 0735 Last data filed at 05/12/2021 0600 Gross per 24 hour  Intake 1101 ml  Output 0 ml  Net 1101 ml    Filed Weights   05/10/21 1120 05/11/21 0511 05/12/21 0130  Weight: 86.5 kg 88.3 kg 89 kg    Examination:  Eomi ncat no focal deficit Cta b BKA with stump srinker on LLE Abd soft S1 s 2no m/r/g   Data Reviewed: personally reviewed   CBC    Component Value Date/Time   WBC 14.3 (H) 05/12/2021 0343   RBC 2.70 (L) 05/12/2021 0343   HGB 8.3 (L) 05/12/2021 0343   HCT 26.2 (L) 05/12/2021 0343   PLT 186 05/12/2021 0343   MCV 97.0 05/12/2021 0343  MCH 30.7 05/12/2021 0343   MCHC 31.7 05/12/2021 0343   RDW 16.3 (H) 05/12/2021 0343   LYMPHSABS 1.3 05/12/2021 0343   MONOABS 0.9 05/12/2021 0343   EOSABS 0.1 05/12/2021 0343   BASOSABS 0.0 05/12/2021 0343   CMP Latest Ref Rng & Units 05/12/2021 05/08/2021 05/07/2021  Glucose 70 - 99 mg/dL 183(H) 221(H) 185(H)  BUN 8 - 23 mg/dL 54(H) 61(H) 57(H)  Creatinine 0.61 - 1.24 mg/dL 5.77(H) 6.75(H) 6.26(H)  Sodium 135 - 145  mmol/L 130(L) 134(L) 134(L)  Potassium 3.5 - 5.1 mmol/L 3.9 4.4 4.1  Chloride 98 - 111 mmol/L 93(L) 93(L) 92(L)  CO2 22 - 32 mmol/L 30 26 28   Calcium 8.9 - 10.3 mg/dL 8.4(L) 8.3(L) 8.9  Total Protein 6.5 - 8.1 g/dL 5.4(L) 5.8(L) 6.8  Total Bilirubin 0.3 - 1.2 mg/dL 1.2 1.1 1.3(H)  Alkaline Phos 38 - 126 U/L 115 98 121  AST 15 - 41 U/L 16 25 29   ALT 0 - 44 U/L 13 19 24      Radiology Studies: No results found.   Scheduled Meds:  aspirin EC  81 mg Oral Daily   atorvastatin  80 mg Oral Daily   calcitRIOL  0.25 mcg Oral BID   Chlorhexidine Gluconate Cloth  6 each Topical Q0600   clopidogrel  75 mg Oral Daily   darbepoetin ([name]) injection - DIALYSIS  150 mcg Intravenous Q Wed-HD   escitalopram  10 mg Oral Daily   ferric citrate  630 mg Oral TID   heparin  5,000 Units Subcutaneous Q8H   insulin aspart  0-6 Units Subcutaneous TID WC   pentoxifylline  400 mg Oral TID WC   sodium chloride flush  3 mL Intravenous Q12H   sorbitol  30 mL Oral Daily   tamsulosin  0.4 mg Oral QHS   Continuous Infusions:  cefTRIAXone (ROCEPHIN)  IV 2 g (05/11/21 2300)   metronidazole 500 mg (05/11/21 2300)   vancomycin       LOS: 4 days   Time spent: 69  Nita Sells, MD [facility] To contact the attending provider between 7A-7P or the covering provider during after hours 7P-7A, please log into the web site [email or web address] and access using universal [place] password for that web site. If you do not have the password, please call the hospital operator.  05/12/2021, 7:35 AM

## 2021-05-12 NOTE — Progress Notes (Signed)
Orthopedic Tech Progress Note Patient Details:  Maxwell Harmon Sep 20, 1956 [id number] Vive Protocol BK has been ordered from [place]  Patient ID: Maxwell Harmon, male   DOB: September 17, 1956, 65 y.o.   MRN: [id number]  Maxwell Harmon 05/12/2021, 11:33 AM

## 2021-05-12 NOTE — Progress Notes (Signed)
PT Cancellation Note  Patient Details Name: Maxwell Harmon MRN: [id number] DOB: Nov 17, 1955   Cancelled Treatment:    Reason Eval/Treat Not Completed: Patient at procedure or test/unavailable.  Pt presently in surgery--? BKA.  Will hold today unless otherwise notified and see 7/9. 05/12/2021  Ginger Carne., PT Acute Rehabilitation Services [phone]  (pager) [phone]  (office)   Tessie Fass Natalyia Innes 05/12/2021, 9:23 AM

## 2021-05-12 NOTE — Anesthesia Procedure Notes (Signed)
Anesthesia Regional Block: Popliteal block   Pre-Anesthetic Checklist: , timeout performed,  Correct Patient, Correct Site, Correct Laterality,  Correct Procedure, Correct Position, site marked,  Risks and benefits discussed,  Surgical consent,  Pre-op evaluation,  At surgeon's request and post-op pain management  Laterality: Left  Prep: chloraprep       Needles:  Injection technique: Single-shot  Needle Type: Echogenic Stimulator Needle     Needle Length: 10cm  Needle Gauge: 20     Additional Needles:   Procedures:,,,, ultrasound used (permanent image in chart),,    Narrative:  Start time: 05/12/2021 8:41 AM End time: 05/12/2021 8:44 AM  Performed by: Personally  Anesthesiologist: Lidia Collum, MD  Additional Notes: Standard monitors applied. Skin prepped. Good needle visualization with ultrasound. Injection made in 5cc increments with no resistance to injection. Patient tolerated the procedure well.

## 2021-05-12 NOTE — Progress Notes (Signed)
Pt has been NPO since midnight just in case pt goes to surgery today. Pt has also had surgical PCR done and CHG wipes.   Maxwell Harmon

## 2021-05-12 NOTE — Progress Notes (Signed)
Report given to short stay RN.  Maxwell Harmon

## 2021-05-12 NOTE — Anesthesia Preprocedure Evaluation (Addendum)
Anesthesia Evaluation  Patient identified by MRN, date of birth, ID band Patient awake    Reviewed: Allergy & Precautions, NPO status , Patient's Chart, lab work & pertinent test results  History of Anesthesia Complications (+) DIFFICULT AIRWAY and history of anesthetic complications  Airway Mallampati: II  TM Distance: >3 FB Neck ROM: Full    Dental  (+) Dental Advisory Given   Pulmonary neg pulmonary ROS,    Pulmonary exam normal        Cardiovascular hypertension, + CAD, + CABG, + Peripheral Vascular Disease and +CHF  Normal cardiovascular exam     Neuro/Psych TIA   GI/Hepatic negative GI ROS, Neg liver ROS,   Endo/Other  diabetes, Insulin Dependent  Renal/GU ESRF and DialysisRenal disease  negative genitourinary   Musculoskeletal negative musculoskeletal ROS (+)   Abdominal   Peds  Hematology  (+) anemia ,   Anesthesia Other Findings   Reproductive/Obstetrics                            Anesthesia Physical Anesthesia Plan  ASA: 3  Anesthesia Plan: MAC and Regional   Post-op Pain Management:  Regional for Post-op pain   Induction: Intravenous  PONV Risk Score and Plan: 1 and Propofol infusion, TIVA and Treatment may vary due to age or medical condition  Airway Management Planned: Natural Airway, Nasal Cannula and Simple Face Mask  Additional Equipment: None  Intra-op Plan:   Post-operative Plan:   Informed Consent: I have reviewed the patients History and Physical, chart, labs and discussed the procedure including the risks, benefits and alternatives for the proposed anesthesia with the patient or authorized representative who has indicated his/her understanding and acceptance.     Dental advisory given  Plan Discussed with:   Anesthesia Plan Comments:        Anesthesia Quick Evaluation

## 2021-05-12 NOTE — Transfer of Care (Signed)
Immediate Anesthesia Transfer of Care Note  Patient: Maxwell Harmon  Procedure(s) Performed: LEFT BELOW KNEE AMPUTATION (Left: Knee)  Patient Location: PACU  Anesthesia Type:MAC and MAC combined with regional for post-op pain  Level of Consciousness: awake, alert , drowsy, patient cooperative and responds to stimulation  Airway & Oxygen Therapy: Patient Spontanous Breathing and Patient connected to nasal cannula oxygen  Post-op Assessment: Report given to RN and Post -op Vital signs reviewed and stable  Post vital signs: Reviewed and stable  Last Vitals:  Vitals Value Taken Time  BP 109/66 05/12/21 0953  Temp    Pulse 95 05/12/21 0953  Resp 17 05/12/21 0953  SpO2 99 % 05/12/21 0953  Vitals shown include unvalidated device data.  Last Pain:  Vitals:   05/12/21 0823  TempSrc: Oral  PainSc:       Patients Stated Pain Goal: 0 (83/33/83 2919)  Complications: No notable events documented.

## 2021-05-12 NOTE — Interval H&P Note (Signed)
History and Physical Interval Note:  05/12/2021 7:25 AM  Maxwell Harmon  has presented today for surgery, with the diagnosis of Gangrene Left Foot.  The various methods of treatment have been discussed with the patient and family. After consideration of risks, benefits and other options for treatment, the patient has consented to  Procedure(s): LEFT BELOW KNEE AMPUTATION (Left) as a surgical intervention.  The patient's history has been reviewed, patient examined, no change in status, stable for surgery.  I have reviewed the patient's chart and labs.  Questions were answered to the patient's satisfaction.     Newt Minion

## 2021-05-13 ENCOUNTER — Encounter (HOSPITAL_COMMUNITY): Payer: Self-pay | Admitting: Orthopedic Surgery

## 2021-05-13 DIAGNOSIS — L03116 Cellulitis of left lower limb: Secondary | ICD-10-CM | POA: Diagnosis not present

## 2021-05-13 LAB — BASIC METABOLIC PANEL
Anion gap: 8 (ref 5–15)
BUN: 33 mg/dL — ABNORMAL HIGH (ref 8–23)
CO2: 27 mmol/L (ref 22–32)
Calcium: 8.3 mg/dL — ABNORMAL LOW (ref 8.9–10.3)
Chloride: 99 mmol/L (ref 98–111)
Creatinine, Ser: 3.95 mg/dL — ABNORMAL HIGH (ref 0.61–1.24)
GFR, Estimated: 16 mL/min — ABNORMAL LOW (ref >60)
Glucose, Bld: 209 mg/dL — ABNORMAL HIGH (ref 70–99)
Potassium: 4 mmol/L (ref 3.5–5.1)
Sodium: 134 mmol/L — ABNORMAL LOW (ref 135–145)

## 2021-05-13 LAB — GLUCOSE, CAPILLARY
Glucose-Capillary: 155 mg/dL — ABNORMAL HIGH (ref 70–99)
Glucose-Capillary: 235 mg/dL — ABNORMAL HIGH (ref 70–99)
Glucose-Capillary: 249 mg/dL — ABNORMAL HIGH (ref 70–99)
Glucose-Capillary: 230 mg/dL — ABNORMAL HIGH (ref 70–99)

## 2021-05-13 LAB — CBC
HCT: 25.4 % — ABNORMAL LOW (ref 39.0–52.0)
Hemoglobin: 8 g/dL — ABNORMAL LOW (ref 13.0–17.0)
MCH: 31.1 pg (ref 26.0–34.0)
MCHC: 31.5 g/dL (ref 30.0–36.0)
MCV: 98.8 fL (ref 80.0–100.0)
Platelets: 175 10*3/uL (ref 150–400)
RBC: 2.57 MIL/uL — ABNORMAL LOW (ref 4.22–5.81)
RDW: 16.1 % — ABNORMAL HIGH (ref 11.5–15.5)
WBC: 9.5 10*3/uL (ref 4.0–10.5)
nRBC: 0 % (ref 0.0–0.2)

## 2021-05-13 LAB — PHOSPHORUS: Phosphorus: 3.2 mg/dL (ref 2.5–4.6)

## 2021-05-13 MED ORDER — INSULIN GLARGINE 100 UNIT/ML ~~LOC~~ SOLN
2.0000 [IU] | Freq: Every day | SUBCUTANEOUS | Status: DC
  Administered 2021-05-13: 2 [IU] via SUBCUTANEOUS
  Filled 2021-05-13 (×2): qty 0.02

## 2021-05-13 NOTE — Progress Notes (Signed)
Inpatient Rehab Admissions Coordinator:    I met with Pt. To discuss potential [name] admit. He states that [employer] Scheuermann is primary payor for him. He is interested in [name], so I will submit case to his insurance on Monday.   Clemens Catholic, [place], [place] Admissions Coordinator  [phone] ([place]) [phone] (office)

## 2021-05-13 NOTE — Progress Notes (Signed)
PROGRESS NOTE   Maxwell Harmon  JKK:[id number] DOB: 1956-02-13 DOA: 05/07/2021 PCP: [name], DO  Brief Narrative:  65 year old white male CAD + CABG 2021 Charcot foot ESRD (prior malignancy status post nephrectomy) previously on peritoneal dialysis Chronic L LE diabetic wound previously followed by podiatry >5 months/cellulitis 04/19/2021 had angiogram with revascularization left popliteal, left posterior tibial artery with ICU stay 04/2021--discharged on antibiotics--underwent cath that time with no options for revascularization  Readmit 05/07/2021 worsening pain discharge erythema Lactic acid normal COVID-negative urinalysis negative Left foot x-ray showed no evidence of osteomyelitis  Orthopedic surgeon Dr. Sharol Given consulted Vascular surgeon Dr. 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 Tonia Brooms vac x 1 week-nonweightbearing left extremity Doppler study=75 to 99% stenosed popliteal artery left side Trental 400 3 times daily--continue hydrocodone 1-2 tab every 4--as per surgery-Dilaudid for breakthrough 0.5 every 4 as needed vancomycin Flagyl ceftriaxone-likely will discontinue the same Leukocytosis-persistent Secondary to ulcer-resolving Nausea this a.m.-significant constipation Sorbitol, fleets enema ordered-r and this is resolved-continue meds ESRD in [place] MWF [previously on PD]-transitioning to HD As per nephrology-phosphorus is within normal limits Superimposed acute blood loss anemia expected from amputation surgery Anemia of renal disease [name] as per nephrology - avoid IV iron c inflammation/infection-continue Auryxia 630 3 times daily 1 unit PRBC transfused 7/7 per Ortho--hb acceptable at this time--transfusion threshold below 7 DM TY 2 with nephropathy neuropathy  with hypoglycemia earlier in hospital stay Uses continuous monitor at home Resume Lantus 2 units 7/9 CBG 150-207 eating 100% meals Anxiety-situational Continue Lexapro 10, Klonopin 0.5 twice daily prn  DVT prophylaxis: Heparin Code Status: Full Family Communication: Discussed with 2 family members at the bedside this morning Disposition:  Status is: Inpatient  Remains inpatient appropriate because:Hemodynamically unstable and Unsafe d/c plan  Dispo: The patient is from: Home              Anticipated d/c is to: SNF              Patient currently is not medically stable to d/c.   Difficult to place patient No     Consultants:  Vascular surgery Dr. Oneida Alar Orthopedic surgery Dr. Sharol Given  Procedures:   7.8.22-BKA Dr. Sharol Given  Antimicrobials:   Vancomycin, cefepime, Flagyl since admission   Subjective:  Doing fairly well was able to get out of the bed to the chair with therapies help pain is moderately controlled took Dilaudid Intermittent cough but no wheeze rales or sputum No fever-this is chronic No abdominal pain did pass a stool yesterday with enema   Objective: Vitals:   05/12/21 1845 05/12/21 1856 05/12/21 2010 05/13/21 0538  BP: 105/60 119/69 94/63 98/68   Pulse: 90 93 98 87  Resp: 20 18 17 16   Temp: (!) 97.5 F (36.4 C) 97.6 F (36.4 C) (!) 97.4 F (36.3 C) 97.8 F (36.6 C)  TempSrc: Oral Oral Oral Oral  SpO2: 100% 100% 96% 96%  Weight:      Height:        Intake/Output Summary (Last 24 hours) at 05/13/2021 0737 Last data filed at 05/12/2021 2244 Gross per 24 hour  Intake 1982.83 ml  Output 3075 ml  Net -1092.17 ml    Filed Weights   05/12/21 0130 05/12/21 0823 05/12/21 1520  Weight: 89 kg 89 kg 90 kg    Examination:  Awake  coherent no distress No wheeze rales rhonchi to lung fields N2-T5 holosystolic murmur Abdomen soft no rebound no guarding Stump shrinker in place Other lower extremity on right side is mobile no edema   Data Reviewed:  personally reviewed   CBC    Component Value Date/Time   WBC 9.5 05/13/2021 0159   RBC 2.57 (L) 05/13/2021 0159   HGB 8.0 (L) 05/13/2021 0159   HCT 25.4 (L) 05/13/2021 0159   PLT 175 05/13/2021 0159   MCV 98.8 05/13/2021 0159   MCH 31.1 05/13/2021 0159   MCHC 31.5 05/13/2021 0159   RDW 16.1 (H) 05/13/2021 0159   LYMPHSABS 1.3 05/12/2021 0343   MONOABS 0.9 05/12/2021 0343   EOSABS 0.1 05/12/2021 0343   BASOSABS 0.0 05/12/2021 0343   CMP Latest Ref Rng & Units 05/13/2021 05/12/2021 05/08/2021  Glucose 70 - 99 mg/dL 209(H) 183(H) 221(H)  BUN 8 - 23 mg/dL 33(H) 54(H) 61(H)  Creatinine 0.61 - 1.24 mg/dL 3.95(H) 5.77(H) 6.75(H)  Sodium 135 - 145 mmol/L 134(L) 130(L) 134(L)  Potassium 3.5 - 5.1 mmol/L 4.0 3.9 4.4  Chloride 98 - 111 mmol/L 99 93(L) 93(L)  CO2 22 - 32 mmol/L 27 30 26   Calcium 8.9 - 10.3 mg/dL 8.3(L) 8.4(L) 8.3(L)  Total Protein 6.5 - 8.1 g/dL - 5.4(L) 5.8(L)  Total Bilirubin 0.3 - 1.2 mg/dL - 1.2 1.1  Alkaline Phos 38 - 126 U/L - 115 98  AST 15 - 41 U/L - 16 25  ALT 0 - 44 U/L - 13 19     Radiology Studies: No results found.   Scheduled Meds:  aspirin EC  81 mg Oral Daily   atorvastatin  80 mg Oral Daily   calcitRIOL  0.25 mcg Oral BID   Chlorhexidine Gluconate Cloth  6 each Topical Q0600   clopidogrel  75 mg Oral Daily   darbepoetin ([name]) injection - DIALYSIS  150 mcg Intravenous Q Wed-HD   docusate sodium  100 mg Oral Daily   escitalopram  10 mg Oral Daily   ferric citrate  630 mg Oral TID   heparin  5,000 Units Subcutaneous Q8H   insulin aspart  0-6 Units Subcutaneous TID WC   nutrition supplement (JUVEN)  1 packet Oral BID BM   pantoprazole  40 mg Oral Daily   pentoxifylline  400 mg Oral TID WC   sodium chloride flush  3 mL Intravenous Q12H   sorbitol  30 mL Oral Daily   tamsulosin  0.4 mg Oral QHS   zinc sulfate  220 mg Oral Daily   Continuous Infusions:  sodium chloride 75 mL/hr at 05/12/21 1322   cefTRIAXone (ROCEPHIN)  IV 2 g (05/12/21 2244)    metronidazole 500 mg (05/13/21 0537)   vancomycin 1,000 mg (05/12/21 1729)     LOS: 5 days   Time spent: [place], MD [facility] To contact the attending provider between 7A-7P or the covering provider during after hours 7P-7A, please log into the web site [email or web address] and access using universal [place] password for that web site. If you do not have the password, please call the hospital operator.  05/13/2021, 7:37 AM

## 2021-05-13 NOTE — Progress Notes (Addendum)
Patient ID: Maxwell Harmon, male   DOB: [birth year], 65 y.o.   MRN: [id number] Patient has no drainage in the wound VAC canister there is a good suction fit.  Patient has the compression sock and limb protector in place.  Discussed the importance of wearing the limb protector when he is ambulating to minimize risk of dehiscence with falls.  Recommended protein supplements twice a day plus vitamin C daily.  Discharge to skilled nursing or inpatient rehab pending recommendations from therapy.  Patient's hemoglobin is stable at 8.0

## 2021-05-13 NOTE — Evaluation (Signed)
Physical Therapy Evaluation Patient Details Name: Maxwell Harmon MRN: [id number] DOB: 03/21/1956 Today's Date: 05/13/2021   History of Present Illness  Pt is a 65 y.o. male admitted 7/3 with diagnosis of cellulitis LLE. Pt presented with gangrenous ischemic changes of the left heel ulcer. 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 PMH: CHF, ESRD (switched from PD to HB 04/2021), hypertension, CAD s/p CABG 2021, PAD h/o limb ischemia, Charcot foot, hyperlipidemia, diabetes, TIA   Clinical Impression  Pt admitted with above diagnosis. PTA pt mod I mobility using RW, living at home with his wife. On eval, he required mod assist bed mobility, +2 min assist sit to stand from elevated surface, and +2 mod assist SPT with RW. Pt currently with functional limitations due to the deficits listed below (see PT Problem List). Pt will benefit from skilled PT to increase their independence and safety with mobility to allow discharge to the venue listed below.       Follow Up Recommendations [name]    Equipment Recommendations  Wheelchair (measurements PT)    Recommendations for Other Services Rehab consult     Precautions / Restrictions Precautions Precautions: Fall Restrictions LLE Weight Bearing: Non weight bearing Other Position/Activity Restrictions: limb protector for mobility      Mobility  Bed Mobility Overal bed mobility: Needs Assistance Bed Mobility: Supine to Sit     Supine to sit: Mod assist;HOB elevated     General bed mobility comments: +rail, increased time, cues for sequencing    Transfers Overall transfer level: Needs assistance Equipment used: Rolling walker (2 wheeled) Transfers: Sit to/from [facility] Sit to Stand: +2 physical assistance;Min assist;From elevated surface Stand pivot transfers: +2 physical assistance;Mod assist       General transfer comment: cues for hand placement and sequencing. Assist to power up and stabilize balance. Able to pivot on R forefoot for  transfer with RW.  Ambulation/Gait             General Gait Details: Able to progress RW forward. Unable to fully support weight on BUE to progress RLE for step.  Stairs            Wheelchair Mobility    Modified Rankin (Stroke Patients Only)       Balance Overall balance assessment: Needs assistance Sitting-balance support: Feet supported;No upper extremity supported Sitting balance-Leahy Scale: Fair     Standing balance support: During functional activity;Bilateral upper extremity supported Standing balance-Leahy Scale: Poor Standing balance comment: heavy reliance on BUE and external support                             Pertinent Vitals/Pain Pain Assessment: 0-10 Pain Score: 3  Pain Location: LLE Pain Descriptors / Indicators: Discomfort Pain Intervention(s): Monitored during session;Repositioned    Home Living Family/patient expects to be discharged to:: Private residence Living Arrangements: Spouse/significant other Available Help at Discharge: Family;Available PRN/intermittently Type of Home: House Home Access: Stairs to enter Entrance Stairs-Rails: Right Entrance Stairs-Number of Steps: 2-3 (Pt son is working on building a ramp.) Home Layout: One level Home Equipment: Shower seat;Cane - single point;Walker - 2 wheels Additional Comments: Pt has a wheelchair that was borrowed from friends. He has not needed it for mobility PTA.    Prior Function Level of Independence: Independent with assistive device(s)         Comments: amb household distances with RW. Multiple falls.     Hand Dominance   Dominant Hand: Right  Extremity/Trunk Assessment   Upper Extremity Assessment Upper Extremity Assessment: Defer to OT evaluation    Lower Extremity Assessment Lower Extremity Assessment: LLE deficits/detail LLE Deficits / Details: s/p BKA, shrinker sock in place, limb protector for mobility    Cervical / Trunk Assessment Cervical /  Trunk Assessment: Normal  Communication   Communication: No difficulties  Cognition Arousal/Alertness: Awake/alert Behavior During Therapy: Flat affect;WFL for tasks assessed/performed Overall Cognitive Status: Within Functional Limits for tasks assessed                                 General Comments: wife present and engaged in session      General Comments      Exercises Amputee Exercises Quad Sets: AROM;Left;5 reps;Supine   Assessment/Plan    PT Assessment Patient needs continued PT services  PT Problem List Decreased strength;Decreased mobility;Decreased knowledge of precautions;Decreased activity tolerance;Pain;Decreased balance;Decreased knowledge of use of DME       PT Treatment Interventions Therapeutic exercise;DME instruction;Gait training;Stair training;Patient/family education;Therapeutic activities;Functional mobility training;Balance training;Wheelchair mobility training    PT Goals (Current goals can be found in the Care Plan section)  Acute Rehab PT Goals Patient Stated Goal: to walk again PT Goal Formulation: With patient/family Time For Goal Achievement: 05/27/21 Potential to Achieve Goals: Good    Frequency Min 3X/week   Barriers to discharge        Co-evaluation               AM-PAC PT "6 Clicks" Mobility  Outcome Measure Help needed turning from your back to your side while in a flat bed without using bedrails?: A Little Help needed moving from lying on your back to sitting on the side of a flat bed without using bedrails?: A Lot Help needed moving to and from a bed to a chair (including a wheelchair)?: A Lot Help needed standing up from a chair using your arms (e.g., wheelchair or bedside chair)?: A Little Help needed to walk in hospital room?: A Lot Help needed climbing 3-5 steps with a railing? : Total 6 Click Score: 13    End of Session Equipment Utilized During Treatment: Gait belt Activity Tolerance: Patient  tolerated treatment well Patient left: in chair;with call bell/phone within reach;with chair alarm set;with family/visitor present Nurse Communication: Mobility status PT Visit Diagnosis: Other abnormalities of gait and mobility (R26.89);Pain;Muscle weakness (generalized) (M62.81) Pain - Right/Left: Left Pain - part of body: Leg    Time: 1224-8250 PT Time Calculation (min) (ACUTE ONLY): 31 min   Charges:   PT Evaluation $PT Eval Moderate Complexity: 1 Mod PT Treatments $Therapeutic Activity: 8-22 mins        Lorrin Goodell, PT  Office # [phone] Pager [phone]   Lorriane Shire 05/13/2021, 11:53 AM

## 2021-05-13 NOTE — Progress Notes (Signed)
[facility] Progress Note    Assessment/ Plan:   ESRD: has been on HD due to issues with CCPD: inadequate drainage/UF (catheter malfunction?) Outpatient orders: [place], MWF, F180, 3hrs, 2k, 2.5cal, 137 na, 35bicarb, 400bfr, 16g, edw 86.5kg. hep 2k bous -discussed with his outpatient home therapies RN 7/5: no plans for further PD and is being scheduled to have PD cath removed as an outpatient, therefore no PD cath flushing needed. -next HD 7/11, maintaining mwf schedule   Cellulitis/diabetic foot, progressive gangrene -abx per primary service, ortho and vvs consulted. Left transtibial amputation 7/8   Volume/ hypertension: EDW 86.5kg. Will UF as tolerated   Anemia of Chronic Kidney Disease: Hemoglobin 8.0. Avoiding IV iron for now, receives mircera 150 mct, last dose 6/24, ordered aranesp here. Transfuse prn   Secondary Hyperparathyroidism/Hyperphosphatemia: continue with home meds    Vascular access: AVF with good B/T. PD cath in place   DM2 w/ hyperglycemia -mgmt per primary service  Hypoalbuminemia, protein calorie malnutrition -push protein    # Additional recommendations: - Dose all meds for creatinine clearance < 10 ml/min  - Unless absolutely necessary, no MRIs with gadolinium.  - Implement save arm precautions.  Prefer needle sticks in the dorsum of the hands or wrists.  No blood pressure measurements in arm. - If blood transfusion is requested during hemodialysis sessions, please alert [place] prior to the session. - If a hemodialysis catheter line culture is requested, please alert [place] as only hemodialysis nurses are able to collect those specimens.    Gean Quint, MD [facility]  Subjective:   Seen and examined bedside. Tolerated hd yesterday, net uf 3L. Still trying to get used to the amputation. No new complaitns, he is trying to eat more protein.   Objective:   BP 98/68 (BP Location: Right Arm)   Pulse 87   Temp 97.8 F (36.6  C) (Oral)   Resp 16   Ht 5\' 11"  (1.803 m)   Wt 90 kg   SpO2 96%   BMI 27.67 kg/m   Intake/Output Summary (Last 24 hours) at 05/13/2021 1046 Last data filed at 05/12/2021 2244 Gross per 24 hour  Intake 1412.83 ml  Output 3000 ml  Net -1587.17 ml   Weight change: 0 kg  Physical Exam: Gen:nad, nontoxic appearing CVS:rrr Resp:normal wob, no acc muscle use, speaking in full sentencess Abd:pd cath in place, soft, nt/nd Ext:no edema, LLE amputated Neuro: awake, alert, speech clear and coherent, moves all ext spontaneously Access: avf +b/t  Imaging: No results found.  Labs: BMET Recent Labs  Lab 05/07/21 1709 05/08/21 0120 05/12/21 0343 05/13/21 0159  NA 134* 134* 130* 134*  K 4.1 4.4 3.9 4.0  CL 92* 93* 93* 99  CO2 28 26 30 27   GLUCOSE 185* 221* 183* 209*  BUN 57* 61* 54* 33*  CREATININE 6.26* 6.75* 5.77* 3.95*  CALCIUM 8.9 8.3* 8.4* 8.3*  PHOS  --   --   --  3.2   CBC Recent Labs  Lab 05/07/21 1709 05/08/21 0120 05/08/21 1232 05/11/21 1628 05/12/21 0343 05/13/21 0159  WBC 13.4* 11.7* 11.3*  --  14.3* 9.5  NEUTROABS 11.1*  --   --   --  11.9*  --   HGB 8.5* 7.8* 8.2* 10.3* 8.3* 8.0*  HCT 27.3* 25.0* 26.4* 33.1* 26.2* 25.4*  MCV 101.1* 100.0 99.6  --  97.0 98.8  PLT 174 149* 149*  --  186 175    Medications:     aspirin  EC  81 mg Oral Daily   atorvastatin  80 mg Oral Daily   calcitRIOL  0.25 mcg Oral BID   Chlorhexidine Gluconate Cloth  6 each Topical Q0600   clopidogrel  75 mg Oral Daily   darbepoetin ([name]) injection - DIALYSIS  150 mcg Intravenous Q Wed-HD   docusate sodium  100 mg Oral Daily   escitalopram  10 mg Oral Daily   ferric citrate  630 mg Oral TID   heparin  5,000 Units Subcutaneous Q8H   insulin aspart  0-6 Units Subcutaneous TID WC   insulin glargine  2 Units Subcutaneous QHS   nutrition supplement (JUVEN)  1 packet Oral BID BM   pantoprazole  40 mg Oral Daily   pentoxifylline  400 mg Oral TID WC   sodium chloride flush  3 mL  Intravenous Q12H   sorbitol  30 mL Oral Daily   tamsulosin  0.4 mg Oral QHS   zinc sulfate  220 mg Oral Daily      Gean Quint, MD [facility] 05/13/2021, 10:46 AM

## 2021-05-13 NOTE — Evaluation (Signed)
Occupational Therapy Evaluation Patient Details Name: Maxwell Harmon MRN: [id number] DOB: 03/23/1956 Today's Date: 05/13/2021    History of Present Illness Pt is a 65 y.o. male admitted 7/3 with diagnosis of cellulitis LLE. Pt presented with gangrenous ischemic changes of the left heel ulcer. 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 PMH: CHF, ESRD (switched from PD to HB 04/2021), hypertension, CAD s/p CABG 2021, PAD h/o limb ischemia, Charcot foot, hyperlipidemia, diabetes, TIA   Clinical Impression   Patient admitted for the diagnosis and procedure above.  PTA he lives with his spouse, who can provide 24 hour assist as needed.  Barriers are listed below.  Currently he is needing up to Mod A for basic transfers, and up to Max A for ADL completion from a seated position.  Upper body HEP left with patient and theraband in his room.  [name] is recommended given the patient's prior level of function and capacity to tolerate a higher intensity rehab.  OT will follow him acutely to maximize his functional status.      Follow Up Recommendations  [name]    Equipment Recommendations  Wheelchair (measurements OT);Wheelchair cushion (measurements OT);3 in 1 bedside commode    Recommendations for Other Services Rehab consult     Precautions / Restrictions Precautions Precautions: Fall Required Braces or Orthoses: Other Brace Other Brace: Limb Guard Restrictions Weight Bearing Restrictions: Yes LLE Weight Bearing: Non weight bearing      Mobility Bed Mobility Overal bed mobility: Needs Assistance Bed Mobility: Sit to Supine       Sit to supine: Mod assist     Patient Response: Flat affect  Transfers Overall transfer level: Needs assistance   Transfers: Squat Pivot Transfers     Squat pivot transfers: Mod assist          Balance Overall balance assessment: Needs assistance Sitting-balance support: Feet supported;No upper extremity supported Sitting balance-Leahy Scale: Good     Standing balance  support: During functional activity;Bilateral upper extremity supported Standing balance-Leahy Scale: Poor Standing balance comment: heavy reliance on BUE and external support                           ADL either performed or assessed with clinical judgement   ADL Overall ADL's : Needs assistance/impaired Eating/Feeding: Independent;Sitting   Grooming: Wash/dry hands;Wash/dry face;Set up;Sitting   Upper Body Bathing: Set up;Sitting   Lower Body Bathing: Moderate assistance;Sitting/lateral leans   Upper Body Dressing : Set up;Sitting   Lower Body Dressing: Maximal assistance;Sitting/lateral leans   Toilet Transfer: Moderate assistance;Squat-pivot   Toileting- Clothing Manipulation and Hygiene: Moderate assistance;Sitting/lateral lean               Vision Baseline Vision/History: Wears glasses Wears Glasses: At all times Patient Visual Report: No change from baseline       Perception     Praxis      Pertinent Vitals/Pain Pain Score: 3  Pain Location: LLE Pain Descriptors / Indicators: Sore;Operative site guarding Pain Intervention(s): Monitored during session     Hand Dominance Right   Extremity/Trunk Assessment Upper Extremity Assessment Upper Extremity Assessment: Generalized weakness   Lower Extremity Assessment Lower Extremity Assessment: Defer to PT evaluation   Cervical / Trunk Assessment Cervical / Trunk Assessment: Normal   Communication Communication Communication: No difficulties   Cognition Arousal/Alertness: Awake/alert Behavior During Therapy: WFL for tasks assessed/performed Overall Cognitive Status: Within Functional Limits for tasks assessed  General Comments       Exercises Other Exercises Other Exercises: yellow theraband 1 set and 10 reps seated:  chest press Other Exercises: yellow theraband 1 set and 10 reps seated: lat pull Other Exercises: yellow theraband 1 set  and 10 reps seated: shoulder horizintal spread Other Exercises: yellow theraband 1 set and 10 reps seated: bicep curl Other Exercises: yellow theraband 1 set and 10 reps seated: tricep push   Shoulder Instructions      Home Living Family/patient expects to be discharged to:: Private residence Living Arrangements: Spouse/significant other Available Help at Discharge: Family;Available PRN/intermittently Type of Home: House Home Access: Stairs to enter   Entrance Stairs-Rails: Right Home Layout: One level     Bathroom Shower/Tub: Occupational psychologist: Standard     Home Equipment: Shower seat;Cane - single point;Walker - 2 wheels          Prior Functioning/Environment Level of Independence: Independent with assistive device(s)        Comments: amb household distances with RW. Multiple falls.  Spouse assist with community mobility, meals, and home management.        OT Problem List: Decreased strength;Decreased activity tolerance;Impaired balance (sitting and/or standing);Decreased knowledge of use of DME or AE;Pain      OT Treatment/Interventions: Self-care/ADL training;Therapeutic exercise;Energy conservation;DME and/or AE instruction;Balance training;Patient/family education;Therapeutic activities    OT Goals(Current goals can be found in the care plan section) Acute Rehab OT Goals Patient Stated Goal: I need to get stronger so I can do more for myself OT Goal Formulation: With patient Time For Goal Achievement: 05/27/21 Potential to Achieve Goals: Good ADL Goals Pt Will Perform Lower Body Bathing: with min assist;sitting/lateral leans Pt Will Perform Lower Body Dressing: with min assist;sitting/lateral leans Pt Will Transfer to Toilet: with min assist;stand pivot transfer;bedside commode Pt Will Perform Toileting - Clothing Manipulation and hygiene: sit to/from stand;with min assist Additional ADL Goal #1: Patient will increase stand tolerance at RW to 2  min to increase independence with toileting.  OT Frequency: Min 2X/week   Barriers to D/C:    none noted       Co-evaluation              AM-PAC OT "6 Clicks" Daily Activity     Outcome Measure Help from another person eating meals?: None Help from another person taking care of personal grooming?: None Help from another person toileting, which includes using toliet, bedpan, or urinal?: A Lot Help from another person bathing (including washing, rinsing, drying)?: A Lot Help from another person to put on and taking off regular upper body clothing?: A Little Help from another person to put on and taking off regular lower body clothing?: A Lot 6 Click Score: 17   End of Session Equipment Utilized During Treatment: Gait belt Nurse Communication: Mobility status  Activity Tolerance: Patient tolerated treatment well Patient left: in bed;with call bell/phone within reach;with bed alarm set  OT Visit Diagnosis: Unsteadiness on feet (R26.81);Muscle weakness (generalized) (M62.81);History of falling (Z91.81);Pain Pain - Right/Left: Left Pain - part of body: Leg                Time: 6440-3474 OT Time Calculation (min): 23 min Charges:  OT General Charges $OT Visit: 1 Visit OT Evaluation $OT Eval Moderate Complexity: 1 Mod OT Treatments $Therapeutic Exercise: 8-22 mins  05/13/2021  Rich, OTR/L  Acute Rehabilitation Services  Office:  [phone]   Metta Clines 05/13/2021, 5:09 PM

## 2021-05-13 NOTE — Progress Notes (Signed)
Inpatient Rehab Admissions Coordinator:   [name] consult received. I await [name] notes to determine appropriateness for [name].   Clemens Catholic, [place], [place] Admissions Coordinator  [phone] ([place]) [phone] (office)

## 2021-05-14 ENCOUNTER — Inpatient Hospital Stay (HOSPITAL_COMMUNITY): Payer: Managed Care, Other (non HMO)

## 2021-05-14 DIAGNOSIS — L03116 Cellulitis of left lower limb: Secondary | ICD-10-CM | POA: Diagnosis not present

## 2021-05-14 DIAGNOSIS — I2510 Atherosclerotic heart disease of native coronary artery without angina pectoris: Secondary | ICD-10-CM

## 2021-05-14 LAB — CBC
HCT: 27.4 % — ABNORMAL LOW (ref 39.0–52.0)
Hemoglobin: 8.4 g/dL — ABNORMAL LOW (ref 13.0–17.0)
MCH: 30.9 pg (ref 26.0–34.0)
MCHC: 30.7 g/dL (ref 30.0–36.0)
MCV: 100.7 fL — ABNORMAL HIGH (ref 80.0–100.0)
Platelets: 178 10*3/uL (ref 150–400)
RBC: 2.72 MIL/uL — ABNORMAL LOW (ref 4.22–5.81)
RDW: 16.3 % — ABNORMAL HIGH (ref 11.5–15.5)
WBC: 11.2 10*3/uL — ABNORMAL HIGH (ref 4.0–10.5)
nRBC: 0 % (ref 0.0–0.2)

## 2021-05-14 LAB — GLUCOSE, CAPILLARY
Glucose-Capillary: 176 mg/dL — ABNORMAL HIGH (ref 70–99)
Glucose-Capillary: 201 mg/dL — ABNORMAL HIGH (ref 70–99)
Glucose-Capillary: 244 mg/dL — ABNORMAL HIGH (ref 70–99)
Glucose-Capillary: 280 mg/dL — ABNORMAL HIGH (ref 70–99)

## 2021-05-14 LAB — BASIC METABOLIC PANEL
Anion gap: 9 (ref 5–15)
BUN: 50 mg/dL — ABNORMAL HIGH (ref 8–23)
CO2: 27 mmol/L (ref 22–32)
Calcium: 8.5 mg/dL — ABNORMAL LOW (ref 8.9–10.3)
Chloride: 97 mmol/L — ABNORMAL LOW (ref 98–111)
Creatinine, Ser: 5.13 mg/dL — ABNORMAL HIGH (ref 0.61–1.24)
GFR, Estimated: 12 mL/min — ABNORMAL LOW (ref >60)
Glucose, Bld: 204 mg/dL — ABNORMAL HIGH (ref 70–99)
Potassium: 4.7 mmol/L (ref 3.5–5.1)
Sodium: 133 mmol/L — ABNORMAL LOW (ref 135–145)

## 2021-05-14 LAB — MAGNESIUM: Magnesium: 2 mg/dL (ref 1.7–2.4)

## 2021-05-14 MED ORDER — INSULIN GLARGINE 100 UNIT/ML ~~LOC~~ SOLN
5.0000 [IU] | Freq: Every day | SUBCUTANEOUS | Status: DC
  Administered 2021-05-14 – 2021-05-18 (×5): 5 [IU] via SUBCUTANEOUS
  Filled 2021-05-14 (×5): qty 0.05

## 2021-05-14 MED ORDER — ALBUTEROL SULFATE (2.5 MG/3ML) 0.083% IN NEBU
3.0000 mL | INHALATION_SOLUTION | Freq: Four times a day (QID) | RESPIRATORY_TRACT | Status: DC
  Administered 2021-05-14 (×3): 3 mL via RESPIRATORY_TRACT
  Filled 2021-05-14 (×3): qty 3

## 2021-05-14 MED ORDER — SORBITOL 70 % SOLN
30.0000 mL | Freq: Two times a day (BID) | Status: DC
  Administered 2021-05-16 – 2021-05-17 (×3): 30 mL via ORAL
  Filled 2021-05-14 (×7): qty 30

## 2021-05-14 MED ORDER — MIDODRINE HCL 5 MG PO TABS
5.0000 mg | ORAL_TABLET | ORAL | Status: DC
  Administered 2021-05-15 – 2021-05-17 (×2): 5 mg via ORAL
  Filled 2021-05-14: qty 1

## 2021-05-14 MED ORDER — ALBUTEROL SULFATE (2.5 MG/3ML) 0.083% IN NEBU: 3.0000 mL | INHALATION_SOLUTION | RESPIRATORY_TRACT | Status: DC | PRN

## 2021-05-14 NOTE — Consult Note (Signed)
Advanced Heart Failure Team Consult Note   Primary Physician: [name], DO PCP-Cardiologist:  None  Reason for Consultation: Systolic HF  HPI:    Maxwell Harmon is seen today for evaluation of systolic HF at the request of Dr. Verlon Au.   Maxwell Harmon is a 65 y/o male (father-in-law of Dr. Nedra Hai) with multiple medical problems including DM2, ESRD, charcot joint, CAD with CABG in 2021 admitted in 6/22 with altered mental status. Creatinine on admit 10. Switched from PD to HD. Found to have LLE wound with concern PAD/critical limb ischemia. Hs trop 1300. Echo done EF 30-35% (previous EF was 50%) pattern on echo suggestive of possible Tako-Tsubo.  Underwent cath 04/14/21 with severe CAD and no options for revascularization  1. LM ok 2. LAD 100% prox 3. LCX 95% prox 4. RCA 100% prox 5. LIMA to LAD widely patent but LAD is occluded immediately after LIMA plugs in 6. Sequential SVG to OM1 & OM2. Tortuous graft with mild narrowing at insertion to OM1 but otherwise OK. OMs are small  LE angio showed severe PAD. Underwent left lower extremity arteriogram and angioplasty lithotripsy of his left popliteal artery by Dr. Stanford Breed on April 19, 2021  Readmitted with critical limb ischemia and progressive gangrenous changes left foot Has subsequently underwent L BKA  Denies CP or SOB. Reports cough (worse with lying down) and LE edema. SBP was in 90s yesterday with HD.  Review of Systems: [y] = yes, '[ ]'  = no   General: Weight gain '[ ]' ; Weight loss '[ ]' ; Anorexia '[ ]' ; Fatigue Blue.Reese ]; Fever '[ ]' ; Chills '[ ]' ; Weakness '[ ]'   Cardiac: Chest pain/pressure '[ ]' ; Resting SOB '[ ]' ; Exertional SOB '[ ]' ; Orthopnea '[ ]' ; Pedal Edema [ y]; Palpitations '[ ]' ; Syncope '[ ]' ; Presyncope '[ ]' ; Paroxysmal nocturnal dyspnea'[ ]'   Pulmonary: Cough '[ ]' ; Wheezing'[ ]' ; Hemoptysis'[ ]' ; Sputum '[ ]' ; Snoring '[ ]'   GI: Vomiting'[ ]' ; Dysphagia'[ ]' ; Melena'[ ]' ; Hematochezia '[ ]' ; Heartburn'[ ]' ; Abdominal pain '[ ]' ; Constipation '[ ]' ; Diarrhea  '[ ]' ; BRBPR '[ ]'   GU: Hematuria'[ ]' ; Dysuria '[ ]' ; Nocturia'[ ]'   Vascular: Pain in legs with walking '[ ]' ; Pain in feet with lying flat '[ ]' ; Non-healing sores [ y]; Stroke '[ ]' ; TIA '[ ]' ; Slurred speech '[ ]' ;  Neuro: Headaches'[ ]' ; Vertigo'[ ]' ; Seizures'[ ]' ; Paresthesias'[ ]' ;Blurred vision '[ ]' ; Diplopia '[ ]' ; Vision changes '[ ]'   Ortho/Skin: Arthritis [ y]; Joint pain [ y]; Muscle pain '[ ]' ; Joint swelling '[ ]' ; Back Pain '[ ]' ; Rash '[ ]'   Psych: Depression'[ ]' ; Anxiety'[ ]'   Heme: Bleeding problems '[ ]' ; Clotting disorders '[ ]' ; Anemia Blue.Reese ]  Endocrine: Diabetes [ y]; Thyroid dysfunction'[ ]'   Home Medications Prior to Admission medications   Medication Sig Start Date End Date Taking? Authorizing Provider  aspirin 81 MG EC tablet Take 1 tablet (81 mg total) by mouth daily. 04/20/21 07/19/21 Yes Dessa Phi, DO  atorvastatin (LIPITOR) 40 MG tablet Take 2 tablets (80 mg total) by mouth daily. Patient taking differently: Take 40 mg by mouth daily. 04/20/21 07/19/21 Yes Dessa Phi, DO  AURYXIA 1 GM 210 MG(Fe) tablet Take 630 mg by mouth 3 (three) times daily. 03/21/21  Yes [provider]  blood glucose meter kit and supplies KIT Dispense based on patient and insurance preference. Use up to four times daily as directed. (FOR ICD-9 250.00, 250.01). Patient taking differently: Inject 1 each into the skin See admin  instructions. Dispense based on patient and insurance preference. Use up to four times daily as directed. (FOR ICD-9 250.00, 250.01). 11/08/15  Yes Nida, Marella Chimes, MD  Blood Glucose Monitoring Suppl (BAYER CONTOUR MONITOR) w/Device KIT 1 each by Does not apply route 4 (four) times daily. Test 4 x daily 11/08/15  Yes Nida, Marella Chimes, MD  calcitRIOL (ROCALTROL) 0.25 MCG capsule Take 0.25 mcg by mouth daily. 02/06/21  Yes [provider]  cinacalcet (SENSIPAR) 30 MG tablet Take 30 mg by mouth once a week. Take on Mondays 03/28/21  Yes [provider]  clonazePAM (KLONOPIN) 0.5 MG tablet  Take 0.5 mg by mouth daily as needed for anxiety.   Yes [provider]  clopidogrel (PLAVIX) 75 MG tablet Take 1 tablet by mouth daily. 02/06/21  Yes [provider]  collagenase (SANTYL) ointment Apply topically daily. 04/21/21  Yes Dessa Phi, DO  fluorouracil (EFUDEX) 5 % cream Apply topically 2 (two) times daily. 03/06/21  Yes [provider]  glucose blood (BAYER CONTOUR TEST) test strip Use as instructed 4 x daily Patient taking differently: 1 each by Other route See admin instructions. Use as instructed 4 x daily 11/08/15  Yes Nida, Marella Chimes, MD  Insulin Glargine (BASAGLAR KWIKPEN) 100 UNIT/ML Inject 5 Units into the skin at bedtime. 04/20/21 07/19/21 Yes Dessa Phi, DO  Insulin Pen Needle (B-D ULTRAFINE III SHORT PEN) 31G X 8 MM MISC 1 each by Does not apply route as directed. 09/02/15  Yes Cassandria Anger, MD  Lancets MISC 1 each by Does not apply route 4 (four) times daily. 11/08/15  Yes Nida, Marella Chimes, MD  mupirocin ointment (BACTROBAN) 2 % Apply topically daily. Patient taking differently: Apply 1 application topically daily. 04/21/21  Yes Dessa Phi, DO  nitroGLYCERIN (NITRODUR - DOSED IN MG/24 HR) 0.2 mg/hr patch Place 1 patch (0.2 mg total) onto the skin daily. 05/02/21  Yes Newt Minion, MD  pentoxifylline (TRENTAL) 400 MG CR tablet Take 1 tablet (400 mg total) by mouth 3 (three) times daily with meals. 05/02/21  Yes Newt Minion, MD  tamsulosin (FLOMAX) 0.4 MG CAPS capsule Take 0.4 mg by mouth at bedtime. 03/29/21  Yes [provider]  escitalopram (LEXAPRO) 10 MG tablet Take 1 tablet (10 mg total) by mouth daily. Patient not taking: No sig reported 04/21/21 07/20/21  Dessa Phi, DO    Past Medical History: Past Medical History:  Diagnosis Date   Diabetes mellitus without complication ([place])    History of bleeding ulcers    History of TIAs    Hypertension    Low kidney function     Past Surgical History: Past  Surgical History:  Procedure Laterality Date   ABDOMINAL AORTOGRAM W/LOWER EXTREMITY Bilateral 04/14/2021   Procedure: ABDOMINAL AORTOGRAM W/LOWER EXTREMITY;  Surgeon: Elam Dutch, MD;  Location: [facility];  Service: Vascular;  Laterality: Bilateral;   AMPUTATION Left 05/12/2021   Procedure: LEFT BELOW KNEE AMPUTATION;  Surgeon: Newt Minion, MD;  Location: [facility];  Service: Orthopedics;  Laterality: Left;   CATARACT EXTRACTION W/PHACO Left 02/15/2014   Procedure: CATARACT EXTRACTION PHACO AND INTRAOCULAR LENS PLACEMENT (IOC);  Surgeon: Tonny Branch, MD;  Location: [facility];  Service: Ophthalmology;  Laterality: Left;  CDE 10.84   CATARACT EXTRACTION W/PHACO Right 12/14/2013   Procedure: CATARACT EXTRACTION PHACO AND INTRAOCULAR LENS PLACEMENT (IOC);  Surgeon: Tonny Branch, MD;  Location: [facility];  Service: Ophthalmology;  Laterality: Right;  CDE:  16.30  CHOLECYSTECTOMY  02/2013   EYE SURGERY     IR FLUORO GUIDE CV LINE RIGHT  01/05/2019   IR US GUIDE VASC ACCESS RIGHT  01/05/2019   LAPAROSCOPIC NEPHRECTOMY Right 01/08/2019   Procedure: LAPAROSCOPIC RADICAL NEPHRECTOMY;  Surgeon: Raynelle Bring, MD;  Location: [facility];  Service: Urology;  Laterality: Right;   LEFT HEART CATH AND CORS/GRAFTS ANGIOGRAPHY N/A 04/14/2021   Procedure: LEFT HEART CATH AND CORS/GRAFTS ANGIOGRAPHY;  Surgeon: Jolaine Artist, MD;  Location: [facility];  Service: Cardiovascular;  Laterality: N/A;   PENILE PROSTHESIS IMPLANT     PERIPHERAL VASCULAR INTERVENTION Left 04/19/2021   Procedure: PERIPHERAL VASCULAR INTERVENTION;  Surgeon: Cherre Robins, MD;  Location: [facility];  Service: Cardiovascular;  Laterality: Left;   TOE AMPUTATION Right 2013   [facility]    Family History: Family History  Problem Relation Age of Onset   Stroke Mother    Stroke Father    Cancer Father    Cancer Brother     Social History: Social History   Socioeconomic History   Marital status: Married     Spouse name: Not on file   Number of children: Not on file   Years of education: Not on file   Highest education level: Not on file  Occupational History   Not on file  Tobacco Use   Smoking status: Never   Smokeless tobacco: Never  Vaping Use   Vaping Use: Never used  Substance and Sexual Activity   Alcohol use: Yes    Comment: social drink    Drug use: No   Sexual activity: Not on file  Other Topics Concern   Not on file  Social History Narrative   Not on file   Social Determinants of Health   Financial Resource Strain: Low Risk    Difficulty of Paying Living Expenses: Not very hard  Food Insecurity: No Food Insecurity   Worried About Charity fundraiser in the Last Year: Never true   Ran Out of Food in the Last Year: Never true  Transportation Needs: No Transportation Needs   Lack of Transportation (Medical): No   Lack of Transportation (Non-Medical): No  Physical Activity: Not on file  Stress: Not on file  Social Connections: Not on file    Allergies:  No Known Allergies  Objective:    Vital Signs:   Temp:  [97.9 F (36.6 C)-98.1 F (36.7 C)] 98 F (36.7 C) (07/10 1025) Pulse Rate:  [96-101] 96 (07/10 1025) Resp:  [16-18] 18 (07/10 1025) BP: (116-127)/(69-77) 116/71 (07/10 1025) SpO2:  [94 %-98 %] 94 % (07/10 1025) Last BM Date: 05/13/21  Weight change: Filed Weights   05/12/21 0130 05/12/21 0823 05/12/21 1520  Weight: 89 kg 89 kg 90 kg    Intake/Output:   Intake/Output Summary (Last 24 hours) at 05/14/2021 1634 Last data filed at 05/14/2021 1300 Gross per 24 hour  Intake 3178.47 ml  Output 300 ml  Net 2878.47 ml      Physical Exam    General:  Lying in bed  No resp difficulty HEENT: normal Neck: supple. JVP to ER with prominent TR. Carotids 2+ bilat; no bruits. No lymphadenopathy or thyromegaly appreciated. Cor: PMI nondisplaced. Regular rate & rhythm. 2/6 TR Lungs: clear Abdomen: soft, nontender, nondistended. No hepatosplenomegaly.  No bruits or masses. Good bowel sounds. Extremities: no cyanosis, clubbing, rash, 2+ edema on right s/p LLE BKA with wrap in palce Neuro: alert & orientedx3, cranial  nerves grossly intact. moves all 4 extremities w/o difficulty. Affect pleasant   Telemetry   NSR 90s Personally reviewed  EKG    Sinus tach 104 LVHG with repol No ST-T wave abnormalities.    Labs   Basic Metabolic Panel: Recent Labs  Lab 05/07/21 1709 05/08/21 0120 05/12/21 0343 05/13/21 0159 05/14/21 0145  NA 134* 134* 130* 134* 133*  K 4.1 4.4 3.9 4.0 4.7  CL 92* 93* 93* 99 97*  CO2 '28 26 30 27 27  ' GLUCOSE 185* 221* 183* 209* 204*  BUN 57* 61* 54* 33* 50*  CREATININE 6.26* 6.75* 5.77* 3.95* 5.13*  CALCIUM 8.9 8.3* 8.4* 8.3* 8.5*  MG  --   --   --   --  2.0  PHOS  --   --   --  3.2  --     Liver Function Tests: Recent Labs  Lab 05/07/21 1709 05/08/21 0120 05/12/21 0343  AST '29 25 16  ' ALT '24 19 13  ' ALKPHOS 121 98 115  BILITOT 1.3* 1.1 1.2  PROT 6.8 5.8* 5.4*  ALBUMIN 2.5* 2.1* 1.9*   No results for input(s): LIPASE, AMYLASE in the last 168 hours. No results for input(s): AMMONIA in the last 168 hours.  CBC: Recent Labs  Lab 05/07/21 1709 05/08/21 0120 05/08/21 1232 05/11/21 1628 05/12/21 0343 05/13/21 0159 05/14/21 0145  WBC 13.4* 11.7* 11.3*  --  14.3* 9.5 11.2*  NEUTROABS 11.1*  --   --   --  11.9*  --   --   HGB 8.5* 7.8* 8.2* 10.3* 8.3* 8.0* 8.4*  HCT 27.3* 25.0* 26.4* 33.1* 26.2* 25.4* 27.4*  MCV 101.1* 100.0 99.6  --  97.0 98.8 100.7*  PLT 174 149* 149*  --  186 175 178    Cardiac Enzymes: No results for input(s): CKTOTAL, CKMB, CKMBINDEX, TROPONINI in the last 168 hours.  BNP: BNP (last 3 results) Recent Labs    04/10/21 1033  BNP 4,239.3*    ProBNP (last 3 results) No results for input(s): PROBNP in the last 8760 hours.   CBG: Recent Labs  Lab 05/13/21 1142 05/13/21 1712 05/13/21 2018 05/14/21 0626 05/14/21 1128  GLUCAP 235* 249* 230* 176* 201*     Coagulation Studies: No results for input(s): LABPROT, INR in the last 72 hours.   Imaging   No results found.   Medications:     Current Medications:  aspirin EC  81 mg Oral Daily   atorvastatin  80 mg Oral Daily   calcitRIOL  0.25 mcg Oral BID   Chlorhexidine Gluconate Cloth  6 each Topical Q0600   clopidogrel  75 mg Oral Daily   darbepoetin ([name]) injection - DIALYSIS  150 mcg Intravenous Q Wed-HD   escitalopram  10 mg Oral Daily   ferric citrate  630 mg Oral TID   heparin  5,000 Units Subcutaneous Q8H   insulin aspart  0-6 Units Subcutaneous TID WC   insulin glargine  5 Units Subcutaneous QHS   nutrition supplement (JUVEN)  1 packet Oral BID BM   pantoprazole  40 mg Oral Daily   pentoxifylline  400 mg Oral TID WC   sodium chloride flush  3 mL Intravenous Q12H   sorbitol  30 mL Oral BID   tamsulosin  0.4 mg Oral QHS   zinc sulfate  220 mg Oral Daily    Infusions:     Assessment/Plan   1. PAD with critical limb ischemia - s/p L BKA this admit   2.  Acute on chronic Systolic Heart Failure due to iCM - Stress ECHO 02/2021 EF ~55%.  - ECHO 04/10/21 EF 30-35% - Cath 6/22 with severe CAD (as per HPI) - Volume status elevated on exam (suspect contributing to cough) - CXR pending - D/w Renal will try to push UF/dry weight. Will likely need midodrine added back (was on previosuly) - Has been off B-blocker with low BP.  - No spiro /arni/dig/SGLT2i with ESRD  - Will plan limited echo to re-check EF  3. CAD - H/O CABG 2021 - Cath 6/22 with severe CAD (as per HPI). No revasc options - No s/s angina - HS Trop 4627>0350. - Continue DAPT/statin - BP has been too low for b-blocker  4. ESRD - Previously on PD but ineffective. Switched iHD - Nephrology following - plan as above -> push dry weight    Length of Stay: 6  Glori Bickers, MD  05/14/2021, 4:34 PM  Advanced Heart Failure Team Pager [phone] (M-F; 7a - 5p)  Please contact [facility] for  night-coverage after hours (4p -7a ) and weekends on [email or web address]

## 2021-05-14 NOTE — Progress Notes (Signed)
PROGRESS NOTE   Maxwell Harmon  SLH:[id number] DOB: [birth year] DOA: 05/07/2021 PCP: [name], DO  Brief Narrative:  65 year old white male CAD + CABG 2021 Charcot foot ESRD (prior malignancy status post nephrectomy) previously on peritoneal dialysis Chronic L LE diabetic wound previously followed by podiatry >5 months/cellulitis 04/19/2021 had angiogram with revascularization left popliteal, left posterior tibial artery with ICU stay 04/2021--discharged on antibiotics--underwent cath that time with no options for revascularization  Readmit 05/07/2021 worsening pain discharge erythema Lactic acid normal COVID-negative urinalysis negative Left foot x-ray showed no evidence of osteomyelitis  Orthopedic surgeon Dr. Sharol Given consulted Vascular surgeon Dr. 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 Tonia Brooms vac x 1 week-nonweightbearing left extremity Doppler study=75 to 99% stenosed popliteal artery left side Trental 400 3 times daily--continue hydrocodone 1-2 tab every 4--as per surgery-Dilaudid for breakthrough 0.5 every 4 as needed Antibiotics discontinued 7/9 for surgery Cough Somewhat persistent-get CXR, 4 L positive today--May need adjustment of goal weights?-Tells me significant exposure to secondhand smoke Start albuterol inhaler [place] how he does if this helps Can be worked up outpatient if relief with spirometry some intermittent cough still Leukocytosis-persistent Secondary to ulcer-monitor trends periodically of white count Nausea this a.m.-significant constipation Sorbitol, fleets enema ordered-with no response so I have increased his sorbitol to twice daily on 7/10 ESRD in [place] MWF [previously on PD]-transitioning to HD As per nephrology-will be going for dialysis  tomorrow Defer to nephrology Superimposed acute blood loss anemia expected from amputation surgery Anemia of renal disease [name] as per nephrology - avoid IV iron c inflammation/infection-continue Auryxia 630 3 times daily 1 unit PRBC transfused 7/7 per Ortho--hb acceptable at this time--transfusion threshold below 7 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 Coralie Keens Disposition:  Status is: Inpatient  Remains inpatient appropriate because:Hemodynamically unstable and Unsafe d/c plan  Dispo: The patient is from: Home              Anticipated d/c is to: SNF              Patient currently is not medically stable to d/c.   Difficult to place patient No     Consultants:  Vascular surgery Dr. Oneida Alar Orthopedic surgery Dr. Sharol Given  Procedures:   7.8.22-BKA Dr. Sharol Given  Antimicrobials:   Vancomycin, cefepime, Flagyl since admission   Subjective:  When he takes a deep breath No chest pain no fever No rales Note that his weight is up about 1 to 1-1/2 kg but he is +4 L since surgery (received blood) He is not nauseous is eating okay He seems a little despondent because he realized how weak he is in his upper extremities-I had a good chat with him about maybe getting some weights and improving his upper body strength while he is in bed He is hopeful for [name] placement and understands that this is not guaranteed and that we will find out once insurance is a submitted 7/11 what the next steps are   Objective: Vitals:   05/13/21 1730 05/13/21 2030 05/13/21 2302  05/14/21 0548  BP: 121/73 127/75 125/77 118/69  Pulse: (!) 101 99 98 96  Resp: 18  17 16    Temp: 97.9 F (36.6 C) 98.1 F (36.7 C)  97.9 F (36.6 C)  TempSrc: Oral Oral  Oral  SpO2: 96% 98% 94% 97%  Weight:      Height:        Intake/Output Summary (Last 24 hours) at 05/14/2021 0913 Last data filed at 05/14/2021 1610 Gross per 24 hour  Intake 3810.53 ml  Output 700 ml  Net 3110.53 ml    Filed Weights   05/12/21 0130 05/12/21 0823 05/12/21 1520  Weight: 89 kg 89 kg 90 kg    Examination:  Awake coherent no distress No wheeze rales rhonchi to lung fields-exam remains unchanged R6-E4 holosystolic murmur Abdomen soft no rebound no guarding Stump shrinker in place No lower extremity edema   Data Reviewed: personally reviewed   CBC    Component Value Date/Time   WBC 11.2 (H) 05/14/2021 0145   RBC 2.72 (L) 05/14/2021 0145   HGB 8.4 (L) 05/14/2021 0145   HCT 27.4 (L) 05/14/2021 0145   PLT 178 05/14/2021 0145   MCV 100.7 (H) 05/14/2021 0145   MCH 30.9 05/14/2021 0145   MCHC 30.7 05/14/2021 0145   RDW 16.3 (H) 05/14/2021 0145   LYMPHSABS 1.3 05/12/2021 0343   MONOABS 0.9 05/12/2021 0343   EOSABS 0.1 05/12/2021 0343   BASOSABS 0.0 05/12/2021 0343   CMP Latest Ref Rng & Units 05/14/2021 05/13/2021 05/12/2021  Glucose 70 - 99 mg/dL 204(H) 209(H) 183(H)  BUN 8 - 23 mg/dL 50(H) 33(H) 54(H)  Creatinine 0.61 - 1.24 mg/dL 5.13(H) 3.95(H) 5.77(H)  Sodium 135 - 145 mmol/L 133(L) 134(L) 130(L)  Potassium 3.5 - 5.1 mmol/L 4.7 4.0 3.9  Chloride 98 - 111 mmol/L 97(L) 99 93(L)  CO2 22 - 32 mmol/L 27 27 30   Calcium 8.9 - 10.3 mg/dL 8.5(L) 8.3(L) 8.4(L)  Total Protein 6.5 - 8.1 g/dL - - 5.4(L)  Total Bilirubin 0.3 - 1.2 mg/dL - - 1.2  Alkaline Phos 38 - 126 U/L - - 115  AST 15 - 41 U/L - - 16  ALT 0 - 44 U/L - - 13     Radiology Studies: No results found.   Scheduled Meds:  albuterol  3 mL Inhalation QID   aspirin EC  81 mg Oral Daily   atorvastatin  80 mg Oral Daily   calcitRIOL  0.25 mcg Oral BID   Chlorhexidine Gluconate Cloth  6 each Topical Q0600    clopidogrel  75 mg Oral Daily   darbepoetin ([name]) injection - DIALYSIS  150 mcg Intravenous Q Wed-HD   escitalopram  10 mg Oral Daily   ferric citrate  630 mg Oral TID   heparin  5,000 Units Subcutaneous Q8H   insulin aspart  0-6 Units Subcutaneous TID WC   insulin glargine  2 Units Subcutaneous QHS   nutrition supplement (JUVEN)  1 packet Oral BID BM   pantoprazole  40 mg Oral Daily   pentoxifylline  400 mg Oral TID WC   sodium chloride flush  3 mL Intravenous Q12H   sorbitol  30 mL Oral BID   tamsulosin  0.4 mg Oral QHS   zinc sulfate  220 mg Oral Daily   Continuous Infusions:     LOS: 6 days   Time spent: 29  Nita Sells, MD [facility] To contact the attending provider between 7A-7P or the covering provider during after hours  7P-7A, please log into the web site [email or web address] and access using universal [place] password for that web site. If you do not have the password, please call the hospital operator.  05/14/2021, 9:13 AM

## 2021-05-14 NOTE — Progress Notes (Signed)
[facility] Progress Note    Assessment/ Plan:   ESRD: has been on HD due to issues with CCPD: inadequate drainage/UF (catheter malfunction?) Outpatient orders: [place], MWF, F180, 3hrs, 2k, 2.5cal, 137 na, 35bicarb, 400bfr, 16g, edw 86.5kg. hep 2k bous -discussed with his outpatient home therapies RN 7/5: no plans for further PD and is being scheduled to have PD cath removed as an outpatient, therefore no PD cath flushing needed. -next HD 7/11, maintaining mwf schedule, will try for AM/first shift   Cellulitis/diabetic foot, progressive gangrene -off abx per primary service, ortho and vvs consulted. 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 [name]   Volume/ hypertension: EDW 86.5kg pre amputation. needs new edw post amputation-Will challenge EDW tomorrow   Anemia of Chronic Kidney Disease: Hemoglobin 8.4. Avoiding IV iron for now, receives mircera 150 mcg, last dose 6/24, ordered aranesp here. Transfuse prn   Secondary Hyperparathyroidism/Hyperphosphatemia: continue with home meds, renal diet, monitor phos   Vascular access: AVF with good B/T. PD cath in place   DM2 w/ hyperglycemia -mgmt per primary service  Hypoalbuminemia, protein calorie malnutrition -push protein    # Additional recommendations: - Dose all meds for creatinine clearance < 10 ml/min  - Unless absolutely necessary, no MRIs with gadolinium.  - Implement save arm precautions.  Prefer needle sticks in the dorsum of the hands or wrists.  No blood pressure measurements in arm. - If blood transfusion is requested during hemodialysis sessions, please alert [place] prior to the session. - If a hemodialysis catheter line culture is requested, please alert [place] as only hemodialysis nurses are able to collect those specimens.    Gean Quint, MD [facility]  Subjective:   Seen and examined bedside. Was sob earlier with nonprod cough. Improved with cough syrup and breathing treatments. No other  complaints/acute events. Open for cxr   Objective:   BP 118/69   Pulse 96   Temp 97.9 F (36.6 C) (Oral)   Resp 16   Ht 5\' 11"  (1.803 m)   Wt 90 kg   SpO2 97%   BMI 27.67 kg/m   Intake/Output Summary (Last 24 hours) at 05/14/2021 1008 Last data filed at 05/14/2021 5732 Gross per 24 hour  Intake 3810.53 ml  Output 500 ml  Net 3310.53 ml   Weight change:   Physical Exam: Gen:nad, nontoxic appearing CVS:rrr Resp:normal wob, no acc muscle use, speaking in full sentences, slightly diminished air entry bibasilar Abd:pd cath in place, soft, nt/nd KGU:RKYHC RLE edema, LLE amputated Neuro: awake, alert, speech clear and coherent, moves all ext spontaneously Access: avf +b/t  Imaging: No results found.  Labs: BMET Recent Labs  Lab 05/07/21 1709 05/08/21 0120 05/12/21 0343 05/13/21 0159 05/14/21 0145  NA 134* 134* 130* 134* 133*  K 4.1 4.4 3.9 4.0 4.7  CL 92* 93* 93* 99 97*  CO2 28 26 30 27 27   GLUCOSE 185* 221* 183* 209* 204*  BUN 57* 61* 54* 33* 50*  CREATININE 6.26* 6.75* 5.77* 3.95* 5.13*  CALCIUM 8.9 8.3* 8.4* 8.3* 8.5*  PHOS  --   --   --  3.2  --    CBC Recent Labs  Lab 05/07/21 1709 05/08/21 0120 05/08/21 1232 05/11/21 1628 05/12/21 0343 05/13/21 0159 05/14/21 0145  WBC 13.4*   < > 11.3*  --  14.3* 9.5 11.2*  NEUTROABS 11.1*  --   --   --  11.9*  --   --   HGB 8.5*   < > 8.2* 10.3* 8.3*  8.0* 8.4*  HCT 27.3*   < > 26.4* 33.1* 26.2* 25.4* 27.4*  MCV 101.1*   < > 99.6  --  97.0 98.8 100.7*  PLT 174   < > 149*  --  186 175 178   < > = values in this interval not displayed.    Medications:     albuterol  3 mL Inhalation QID   aspirin EC  81 mg Oral Daily   atorvastatin  80 mg Oral Daily   calcitRIOL  0.25 mcg Oral BID   Chlorhexidine Gluconate Cloth  6 each Topical Q0600   clopidogrel  75 mg Oral Daily   darbepoetin ([name]) injection - DIALYSIS  150 mcg Intravenous Q Wed-HD   escitalopram  10 mg Oral Daily   ferric citrate  630 mg Oral TID    heparin  5,000 Units Subcutaneous Q8H   insulin aspart  0-6 Units Subcutaneous TID WC   insulin glargine  5 Units Subcutaneous QHS   nutrition supplement (JUVEN)  1 packet Oral BID BM   pantoprazole  40 mg Oral Daily   pentoxifylline  400 mg Oral TID WC   sodium chloride flush  3 mL Intravenous Q12H   sorbitol  30 mL Oral BID   tamsulosin  0.4 mg Oral QHS   zinc sulfate  220 mg Oral Daily      Gean Quint, MD [facility] 05/14/2021, 10:08 AM

## 2021-05-14 NOTE — PMR Pre-admission (Signed)
PMR Admission Coordinator Pre-Admission Assessment  Patient: Maxwell Harmon is an 65 y.o., male MRN: [id number] DOB: Feb 05, 1956 Height: _0  (180.3 cm) Weight: 89.9 kg  Insurance Information HMO:   Yes    PPO:      PCP:      IPA:      80/20:      OTHER:  PRIMARY: [employer] Managed       Policy#: [id number]      Subscriber: Maxwell Harmon Pre Cert # [id number] from 05/18/21 to 05/24/21 with update due 05/25/21 CM Name: [name]      Phone#: Neita Goodnight [phone] ext [phone])     Fax#:   Eff Date: 10/03/2015 - 11/04/2021 Deductible: does not have one OOP Max: does not have one [name]: 80% coverage, 20% co-insurance SNF: 80% coverage, 20% co-insurance Outpatient: 80% coverage, 20% co-insurance; limited to 21 visits/cal yr (21 remaining) Home Health:  80% coverage, 20% co-insurance; limited to 33 visits/cal yr (81 remaining) DME: 80% coverage, 20% co-insurance Providers: in network  SECONDARY: Medicare Part A      Policy#:      Phone#:  Development worker, community:       Phone#:   The Engineer, petroleum" for patients in Inpatient Rehabilitation Facilities with attached "Privacy Act [place] Records" was provided and verbally reviewed with: Patient  Emergency Contact Information Contact Information     Name Relation Home Work Mobile   [place] Spouse   [phone]       Current Medical History  Patient Admitting Diagnosis: L BKA  History of Present Illness:  A 65 year old male (father in law of Dr. Evlyn Courier) with history of HTN, T2DM, ESRD- HD MWF, CAD s/p CABG 2021 and NSTEMI 04/2021 with cath 04/2021 revealing no options for revascularization, severe PAD s/p angioplasty with chronic left foot ulcer who was readmitted on 05/07/21 with increase in foot pain, edema and increase in drainage. He was started on broad spectrum antibiotics and Dr.Fields/Hawken consulted in attempts for limb salvage. Due to rapid deterioration with concerns of osteomyelitis, BKA recommended and patient  underwent said procedure by Dr. Sharol Given on 07/08. He was transfused with one unit PRBC pre-op and continues on HD with no plans for resumption of PD--catheter to be removed on outpatient basis. He has had issues with SOB/DOE with hypotension on 07/11 felt to be due to fluid overload and EDW challenged with addition of midodrine for BP support on HD days. 2D echo done showing EF 25-30% and Dr. Haroldine Laws consulted for input. He recommended optimization of fluid status per HD which has improved patient's symptoms. Therapy ongoing and patient continues to be limited by weakness, fatigue, right charcot foot and poor standing balance secondary to BKA. [name] recommended due to functional decline.   Patient's medical record from [facility] has been reviewed by the rehabilitation admission coordinator and physician.  Past Medical History  Past Medical History:  Diagnosis Date   Anemia of chronic disease    CAD (coronary artery disease)    Diabetes mellitus without complication (HCC)    ESRD (end stage renal disease) on dialysis ([place])    MWF in [place]   History of bleeding ulcers    History of TIAs    Hypertension    NSTEMI (non-ST elevated myocardial infarction) ([place]) 04/2021   PAD (peripheral artery disease) ([place])     Family History   family history includes Cancer in his brother and father; Stroke in his father and mother.  Prior  Rehab/Hospitalizations Has the patient had prior rehab or hospitalizations prior to admission? Yes  Has the patient had major surgery during 100 days prior to admission? Yes   Current Medications  Current Facility-Administered Medications:    acetaminophen (TYLENOL) tablet 650 mg, 650 mg, Oral, Q6H PRN, 650 mg at 05/18/21 1210 **OR** acetaminophen (TYLENOL) suppository 650 mg, 650 mg, Rectal, Q6H PRN, Persons, Bevely Palmer, PA   albuterol (PROVENTIL) (2.5 MG/3ML) 0.083% nebulizer solution 3 mL, 3 mL, Inhalation, Q4H PRN, Verlon Au, Jai-Gurmukh, MD    alum & mag hydroxide-simeth (MAALOX/MYLANTA) 200-200-20 MG/5ML suspension 15-30 mL, 15-30 mL, Oral, Q2H PRN, Persons, Bevely Palmer, PA   aspirin EC tablet 81 mg, 81 mg, Oral, Daily, Persons, Bevely Palmer, PA, 81 mg at 05/18/21 0847   atorvastatin (LIPITOR) tablet 80 mg, 80 mg, Oral, Daily, Persons, Bevely Palmer, PA, 80 mg at 05/18/21 0846   bisacodyl (DULCOLAX) EC tablet 5 mg, 5 mg, Oral, Daily PRN, Persons, Bevely Palmer, PA   calcitRIOL (ROCALTROL) capsule 0.25 mcg, 0.25 mcg, Oral, BID, Persons, Bevely Palmer, PA, 0.25 mcg at 05/18/21 0846   clonazePAM (KLONOPIN) tablet 0.5 mg, 0.5 mg, Oral, BID PRN, Persons, Bevely Palmer, PA, 0.5 mg at 05/18/21 0017   clopidogrel (PLAVIX) tablet 75 mg, 75 mg, Oral, Daily, Persons, Bevely Palmer, PA, 75 mg at 05/18/21 0847   Darbepoetin Alfa ([name]) injection 150 mcg, 150 mcg, Intravenous, Q Wed-HD, Persons, Bevely Palmer, PA, 150 mcg at 05/17/21 1037   escitalopram (LEXAPRO) tablet 10 mg, 10 mg, Oral, Daily, Persons, Bevely Palmer, PA, 10 mg at 05/18/21 0847   ferric citrate (AURYXIA) tablet 420 mg, 420 mg, Oral, TID, Justin Mend, MD, 420 mg at 05/18/21 0847   ferric gluconate (FERRLECIT) 125 mg in sodium chloride 0.9 % 100 mL IVPB, 125 mg, Intravenous, Q M,W,F-HD, Justin Mend, MD, Stopped at 05/17/21 1120   gentamicin cream (GARAMYCIN) 0.1 %, , Topical, Daily, Justin Mend, MD, Given at 05/17/21 1119   guaiFENesin-dextromethorphan (ROBITUSSIN DM) 100-10 MG/5ML syrup 15 mL, 15 mL, Oral, Q4H PRN, Persons, Bevely Palmer, PA, 15 mL at 05/16/21 0648   heparin injection 5,000 Units, 5,000 Units, Subcutaneous, Q8H, Persons, Bevely Palmer, PA, 5,000 Units at 05/18/21 0611   HYDROmorphone (DILAUDID) tablet 1 mg, 1 mg, Oral, Q6H PRN, Verlon Au, Jai-Gurmukh, MD, 1 mg at 05/17/21 2124   insulin aspart (novoLOG) injection 0-6 Units, 0-6 Units, Subcutaneous, TID WC, Persons, Bevely Palmer, PA, 1 Units at 05/18/21 1207   insulin glargine (LANTUS) injection 5 Units, 5 Units, Subcutaneous, QHS, Samtani,  Jai-Gurmukh, MD, 5 Units at 05/17/21 2125   magnesium citrate solution 1 Bottle, 1 Bottle, Oral, Once PRN, Persons, Bevely Palmer, PA   midodrine (PROAMATINE) tablet 5 mg, 5 mg, Oral, Q M,W,F-HD, Gean Quint, MD, 5 mg at 05/17/21 2992   nutrition supplement (JUVEN) (JUVEN) powder packet 1 packet, 1 packet, Oral, BID BM, Persons, Bevely Palmer, [place], 1 packet at 05/18/21 0845   ondansetron (ZOFRAN) injection 4 mg, 4 mg, Intravenous, Q8H PRN, Persons, Bevely Palmer, PA, 4 mg at 05/12/21 0602   pantoprazole (PROTONIX) EC tablet 40 mg, 40 mg, Oral, Daily, Persons, Bevely Palmer, PA, 40 mg at 05/18/21 0847   pentoxifylline (TRENTAL) CR tablet 400 mg, 400 mg, Oral, TID WC, Persons, Bevely Palmer, PA, 400 mg at 05/18/21 1207   phenol (CHLORASEPTIC) mouth spray 1 spray, 1 spray, Mouth/Throat, PRN, Persons, Bevely Palmer, PA   polyethylene glycol (MIRALAX / GLYCOLAX) packet 17 g, 17 g, Oral, Daily PRN, Persons, Bevely Palmer,  PA, 17 g at 05/14/21 1722   potassium chloride SA (KLOR-CON) CR tablet 20-40 mEq, 20-40 mEq, Oral, Daily PRN, Persons, Bevely Palmer, PA   sodium chloride flush (NS) 0.9 % injection 3 mL, 3 mL, Intravenous, Q12H, Persons, Bevely Palmer, PA, 3 mL at 05/17/21 2127   sorbitol 70 % solution 30 mL, 30 mL, Oral, BID, Samtani, Jai-Gurmukh, MD, 30 mL at 05/17/21 1249   tamsulosin (FLOMAX) capsule 0.4 mg, 0.4 mg, Oral, QHS, Persons, Bevely Palmer, PA, 0.4 mg at 05/17/21 2126   zinc sulfate capsule 220 mg, 220 mg, Oral, Daily, Persons, Bevely Palmer, PA, 220 mg at 05/18/21 0846  Patients Current Diet:  Diet Order             Diet - low sodium heart healthy           Diet Carb Modified Fluid consistency: Thin; Room service appropriate? Yes  Diet effective now                   Precautions / Restrictions Precautions Precautions: Fall Precaution Comments: charcot foot Other Brace: Limb Guard Restrictions Weight Bearing Restrictions: No LLE Weight Bearing: Non weight bearing Other Position/Activity Restrictions: limb  protector for mobility   Has the patient had 2 or more falls or a fall with injury in the past year? Yes  Prior Activity Level Community (5-7x/wk): Maxwell Harmon. was active inthe community PTA  Prior Functional Level Self Care: Did the patient need help bathing, dressing, using the toilet or eating? Independent  Indoor Mobility: Did the patient need assistance with walking from room to room (with or without device)? Needed some help  Stairs: Did the patient need assistance with internal or external stairs (with or without device)? Needed some help  Functional Cognition: Did the patient need help planning regular tasks such as shopping or remembering to take medications? Independent  Home Assistive Devices / Equipment Home Assistive Devices/Equipment: Scales, Blood pressure cuff, CBG Meter, Wheelchair, Environmental consultant (specify type), Eyeglasses, Cane (specify quad or straight), Grab bars in shower Home Equipment: Shower seat, [place] - single point, Walker - 2 wheels  Prior Device Use: Indicate devices/aids used by the patient prior to current illness, exacerbation or injury? Walker  Current Functional Level Cognition  Overall Cognitive Status: Within Functional Limits for tasks assessed Orientation Level: Oriented X4 General Comments: increasing anxiety with mobility limiting progression, Maxwell Harmon repeatedly saying he is sorry that he is not doing better, reassured Maxwell Harmon that progression is not linear and some days are better than others.    Extremity Assessment (includes Sensation/Coordination)  Upper Extremity Assessment: Generalized weakness (Maxwell Harmon endorses completing HEP at least 2x each day)  Lower Extremity Assessment: Defer to Maxwell Harmon evaluation LLE Deficits / Details: s/p BKA, shrinker sock in place, limb protector for mobility    ADLs  Overall ADL's : Needs assistance/impaired Eating/Feeding: Independent, Sitting Eating/Feeding Details (indicate cue type and reason): Maxwell Harmon finished eating breakfast upon  arrival Grooming: Set up, Sitting Grooming Details (indicate cue type and reason): at sink level while seated in the recliner Upper Body Bathing: Set up, Sitting Lower Body Bathing: Moderate assistance, Sitting/lateral leans Upper Body Dressing : Set up, Sitting Lower Body Dressing: Minimal assistance Lower Body Dressing Details (indicate cue type and reason): minA to don limb guard while long sitting in bed Toilet Transfer: Moderate assistance, Stand-pivot, RW Toilet Transfer Details (indicate cue type and reason): simulated from EOB to recliner toward right Toileting- Clothing Manipulation and Hygiene: Moderate assistance, Sitting/lateral lean Functional mobility during ADLs:  Moderate assistance, Rolling walker General ADL Comments: Maxwell Harmon limited by instability, decreased activity tolerance, new LLE BKA    Mobility  Overal bed mobility: Needs Assistance Bed Mobility: Sit to Supine Supine to sit: HOB elevated, Min guard Sit to supine: Mod assist General bed mobility comments: min guard for safety, Maxwell Harmon requires increased time and effort, as well as vc for sequencing    Transfers  Overall transfer level: Needs assistance Equipment used: Rolling walker (2 wheeled) Transfers: Sit to/from Stand, Stand Pivot Transfers Sit to Stand: Min assist, Mod assist Stand pivot transfers: Mod assist Squat pivot transfers: Mod assist General transfer comment: min A to come to upright however unable to manage RW for pivot to chair, min A for stand pivot transfer to chair with belt assist, vc for coming up on ball of foot for pivot point, practiced sit>stand to RW able to achieve with mod A however increased time to achieve, vc for not overthinking, tried again with RW and unable to extend R knee to achieve upright, modA for coming to upright with face to face belt assit, continues to have difficulty extending knee, Maxwell Harmon admits that he is very fearful about falling    Ambulation / Gait / Stairs / Wheelchair  Mobility  Ambulation/Gait Ambulation/Gait assistance: Min assist, Mod assist Gait Distance (Feet): 1 Feet Assistive device: Rolling walker (2 wheeled) Gait Pattern/deviations:  (2 hops forward and attempt to hop back unable, attempted again however not able to clear foot) General Gait Details: unable    Posture / Balance Balance Overall balance assessment: Needs assistance Sitting-balance support: Feet supported, No upper extremity supported Sitting balance-Leahy Scale: Good Standing balance support: During functional activity, Bilateral upper extremity supported Standing balance-Leahy Scale: Poor Standing balance comment: heavy reliance on BUE and external support    Special needs/care consideration Dialysis: Hemodialysis Monday, Wednesday, and Friday, Wound Vac Yes to L BKA site, and Skin post op L BKA incision with VAC   Previous Home Environment (from acute therapy documentation) Living Arrangements: Spouse/significant other  Lives With: Spouse Available Help at Discharge: Family, Available PRN/intermittently Type of Home: House Home Layout: One level Home Access: Stairs to enter Entrance Stairs-Rails: Right Entrance Stairs-Number of Steps: 2-3 (Maxwell Harmon son is working on building a ramp.) Bathroom Shower/Tub: Multimedia programmer: Standard Home Care Services: Yes Type of Home Care Services: [place] (if known): interum Additional Comments: Maxwell Harmon has a wheelchair that was borrowed from friends. He has not needed it for mobility PTA.  Discharge Living Setting Plans for Discharge Living Setting: Patient's home Type of Home at Discharge: House Discharge Home Layout: One level Discharge Home Access: Stairs to enter Entrance Stairs-Rails: Right Entrance Stairs-Number of Steps: 2-3 Discharge Bathroom Shower/Tub: Walk-in shower Discharge Bathroom Toilet: Standard Discharge Bathroom Accessibility: Yes How Accessible: Accessible via walker Does the patient  have any problems obtaining your medications?: Yes (Describe)  Social/Family/Support Systems Patient Roles: Spouse Contact Information: [phone] Anticipated Caregiver: Wyndham Santilli Anticipated Caregiver's Contact Information: [phone] Caregiver Availability: 24/7 Discharge Plan Discussed with Primary Caregiver: Yes Is Caregiver In Agreement with Plan?: Yes Does Caregiver/Family have Issues with Lodging/Transportation while Maxwell Harmon is in Rehab?: No  Goals Patient/Family Goal for Rehab: Maxwell Harmon?OT Supervision Expected length of stay: 7-10 days Maxwell Harmon/Family Agrees to Admission and willing to participate: Yes Program Orientation Provided & Reviewed with Maxwell Harmon/Caregiver Including Roles  & Responsibilities: Yes  Decrease burden of Care through IP rehab admission: Specialzed equipment needs, Decrease number of caregivers, Bowel and bladder program, and Patient/family  education  Possible need for SNF placement upon discharge: not anticipated  Patient Condition: I have reviewed medical records from [facility], spoken with CM, and patient. I met with patient at the bedside and discussed via phone for inpatient rehabilitation assessment.  Patient will benefit from ongoing Maxwell Harmon and OT, can actively participate in 3 hours of therapy a day 5 days of the week, and can make measurable gains during the admission.  Patient will also benefit from the coordinated team approach during an Inpatient Acute Rehabilitation admission.  The patient will receive intensive therapy as well as Rehabilitation physician, nursing, social worker, and care management interventions.  Due to safety, skin/wound care, disease management, medication administration, pain management, and patient education the patient requires 24 hour a day rehabilitation nursing.  The patient is currently min to mod Assist with mobility and basic ADLs.  Discharge setting and therapy post discharge at home with home health is anticipated.  Patient  has agreed to participate in the Acute Inpatient Rehabilitation Program and will admit today.  Preadmission Screen Completed By:  Clemens Catholic with updates done by Retta Diones, 05/18/2021 12:46 PM ______________________________________________________________________   Discussed status with Dr. Dagoberto Ligas on 05/18/21 at 0930 and received approval for admission today.  Admission Coordinator:  Retta Diones, RN, time 1256/Date 05/18/21   Assessment/Plan: Diagnosis: Does the need for close, 24 hr/day Medical supervision in concert with the patient's rehab needs make it unreasonable for this patient to be served in a less intensive setting? Yes Co-Morbidities requiring supervision/potential complications: CAD/s/p CABG, s/p recent NSTEMI; ESRD/HD, DM, Severe PAD s/p L BKA; hypotension now with HD; R charcot foot Due to bowel management, safety, skin/wound care, disease management, medication administration, pain management, and patient education, does the patient require 24 hr/day rehab nursing? Yes Does the patient require coordinated care of a physician, rehab nurse, Maxwell Harmon, OT, and SLP to address physical and functional deficits in the context of the above medical diagnosis(es)? Yes Addressing deficits in the following areas: balance, endurance, locomotion, strength, transferring, bathing, dressing, feeding, grooming, and toileting Can the patient actively participate in an intensive therapy program of at least 3 hrs of therapy 5 days a week? Yes The potential for patient to make measurable gains while on inpatient rehab is good Anticipated functional outcomes upon discharge from inpatient rehab: supervision Maxwell Harmon, supervision OT, n/a SLP Estimated rehab length of stay to reach the above functional goals is: 7-10 days Anticipated discharge destination: Home 10. Overall Rehab/Functional Prognosis: good   MD Signature:

## 2021-05-15 ENCOUNTER — Other Ambulatory Visit (HOSPITAL_COMMUNITY): Payer: Managed Care, Other (non HMO)

## 2021-05-15 ENCOUNTER — Inpatient Hospital Stay (HOSPITAL_COMMUNITY): Payer: Managed Care, Other (non HMO)

## 2021-05-15 DIAGNOSIS — I5023 Acute on chronic systolic (congestive) heart failure: Secondary | ICD-10-CM

## 2021-05-15 DIAGNOSIS — L03116 Cellulitis of left lower limb: Secondary | ICD-10-CM | POA: Diagnosis not present

## 2021-05-15 LAB — CBC
HCT: 27.3 % — ABNORMAL LOW (ref 39.0–52.0)
Hemoglobin: 8.2 g/dL — ABNORMAL LOW (ref 13.0–17.0)
MCH: 30.4 pg (ref 26.0–34.0)
MCHC: 30 g/dL (ref 30.0–36.0)
MCV: 101.1 fL — ABNORMAL HIGH (ref 80.0–100.0)
Platelets: 190 10*3/uL (ref 150–400)
RBC: 2.7 MIL/uL — ABNORMAL LOW (ref 4.22–5.81)
RDW: 16.5 % — ABNORMAL HIGH (ref 11.5–15.5)
WBC: 10.7 10*3/uL — ABNORMAL HIGH (ref 4.0–10.5)
nRBC: 0 % (ref 0.0–0.2)

## 2021-05-15 LAB — RENAL FUNCTION PANEL
Albumin: 1.8 g/dL — ABNORMAL LOW (ref 3.5–5.0)
Anion gap: 9 (ref 5–15)
BUN: 75 mg/dL — ABNORMAL HIGH (ref 8–23)
CO2: 24 mmol/L (ref 22–32)
Calcium: 8.5 mg/dL — ABNORMAL LOW (ref 8.9–10.3)
Chloride: 94 mmol/L — ABNORMAL LOW (ref 98–111)
Creatinine, Ser: 6.16 mg/dL — ABNORMAL HIGH (ref 0.61–1.24)
GFR, Estimated: 9 mL/min — ABNORMAL LOW (ref >60)
Glucose, Bld: 179 mg/dL — ABNORMAL HIGH (ref 70–99)
Phosphorus: 3.4 mg/dL (ref 2.5–4.6)
Potassium: 4.8 mmol/L (ref 3.5–5.1)
Sodium: 127 mmol/L — ABNORMAL LOW (ref 135–145)

## 2021-05-15 LAB — ECHOCARDIOGRAM LIMITED
Calc EF: 30.7 %
Height: 71 in
S' Lateral: 5 cm
Single Plane A2C EF: 26.1 %
Single Plane A4C EF: 37.4 %
Weight: 3192.26 oz

## 2021-05-15 LAB — GLUCOSE, CAPILLARY
Glucose-Capillary: 165 mg/dL — ABNORMAL HIGH (ref 70–99)
Glucose-Capillary: 122 mg/dL — ABNORMAL HIGH (ref 70–99)
Glucose-Capillary: 147 mg/dL — ABNORMAL HIGH (ref 70–99)
Glucose-Capillary: 188 mg/dL — ABNORMAL HIGH (ref 70–99)

## 2021-05-15 LAB — SURGICAL PATHOLOGY

## 2021-05-15 MED ORDER — MIDODRINE HCL 5 MG PO TABS
ORAL_TABLET | ORAL | Status: AC
  Filled 2021-05-15: qty 1

## 2021-05-15 MED ORDER — PERFLUTREN LIPID MICROSPHERE
1.0000 mL | INTRAVENOUS | Status: AC | PRN
  Administered 2021-05-15: 2 mL via INTRAVENOUS
  Filled 2021-05-15: qty 10

## 2021-05-15 NOTE — Progress Notes (Signed)
  Echocardiogram 2D Echocardiogram WITH CONTRAST has been performed.  Maxwell Harmon F 05/15/2021, 5:28 PM

## 2021-05-15 NOTE — Progress Notes (Signed)
Inpatient Rehab Admissions Coordinator:   I do not have insurance authorization of a bed for this pt. Tomorrow. I will follow for potential admit pending insurance auth and bed availability.   Clemens Catholic, [place], [place] Admissions Coordinator  [phone] ([place]) [phone] (office)

## 2021-05-15 NOTE — Progress Notes (Signed)
Physical Therapy Treatment Patient Details Name: Maxwell Harmon MRN: [id number] DOB: 1955-11-13 Today's Date: 05/15/2021    History of Present Illness Pt is a 65 y.o. male admitted 7/3 with diagnosis of cellulitis LLE. Pt presented with gangrenous ischemic changes of the left heel ulcer. 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 PMH: CHF, ESRD (switched from PD to HB 04/2021), hypertension, CAD s/p CABG 2021, PAD h/o limb ischemia, Charcot foot, hyperlipidemia, diabetes, TIA    PT Comments    Pt is just back from HD and willing to work with therapy. Pt relates that he is afraid he will fail to meet therapist expectations. Educated pt that as long as he listened and worked hard he could not fail. Pt in agreement to getting up to recliner. Pt reports phantom pains. Educated on using visual and tactile stimulus to decrease pain. Pt limited in safe mobility by pain, change in CoG and decreased strength. Pt modA to come to EoB, modA for sit to standin and modA for stand pivot transfer to chair. Pt able to perform exercise in chair before being set up for lunch. Pt making good progress today and desire to return to independent mobility make him a good candidate for [name] level rehab before return home. PT will continue to follow acutely.     Follow Up Recommendations  [name]     Equipment Recommendations  Wheelchair (measurements PT)    Recommendations for Other Services Rehab consult     Precautions / Restrictions Precautions Precautions: Fall Precaution Comments: charcot foot, ulcer left heel Required Braces or Orthoses: Other Brace Other Brace: Limb Guard Restrictions Weight Bearing Restrictions: Yes LLE Weight Bearing: Non weight bearing Other Position/Activity Restrictions: limb protector for mobility    Mobility  Bed Mobility Overal bed mobility: Needs Assistance Bed Mobility: Sit to Supine     Supine to sit: Mod assist;HOB elevated     General bed mobility comments: +rail, increased time, cues for  sequencing    Transfers Overall transfer level: Needs assistance Equipment used: Rolling walker (2 wheeled) Transfers: Sit to/from [facility] Sit to Stand: From elevated surface;Max assist;Mod assist Stand pivot transfers: Mod assist       General transfer comment: cues for hand placement for power up, unable to reach up to RW on first attempt with assist on L side. with belt assist in front and blocking of R knee pt able to come to standing in RW. once in standing able to position for optimal tricep engagement and pt able to hop 3x to R before increased fatige requiring return to sitting. ModA for stand pivot transfer to recliner on R  Ambulation/Gait             General Gait Details: unable         Balance Overall balance assessment: Needs assistance Sitting-balance support: Feet supported;No upper extremity supported Sitting balance-Leahy Scale: Good     Standing balance support: During functional activity;Bilateral upper extremity supported Standing balance-Leahy Scale: Poor Standing balance comment: heavy reliance on BUE and external support                            Cognition Arousal/Alertness: Awake/alert Behavior During Therapy: WFL for tasks assessed/performed Overall Cognitive Status: Within Functional Limits for tasks assessed  Exercises Amputee Exercises Quad Sets: AROM;Left;5 reps;Supine Other Exercises Other Exercises: chair push ups x10    General Comments General comments (skin integrity, edema, etc.): Pt with increased anxiety of falling and not being able to succeed at mobility, with increased encouragement pt able to perform sequencing needed for mobility      Pertinent Vitals/Pain Pain Assessment: Faces Faces Pain Scale: Hurts little more Pain Location: LLE Pain Descriptors / Indicators: Sore;Operative site guarding Pain Intervention(s): Limited activity  within patient's tolerance;Monitored during session;Repositioned     PT Goals (current goals can now be found in the care plan section) Acute Rehab PT Goals Patient Stated Goal: I need to get stronger so I can do more for myself PT Goal Formulation: With patient/family Time For Goal Achievement: 05/27/21 Potential to Achieve Goals: Good Progress towards PT goals: Progressing toward goals    Frequency    Min 3X/week      PT Plan Current plan remains appropriate       AM-PAC PT "6 Clicks" Mobility   Outcome Measure  Help needed turning from your back to your side while in a flat bed without using bedrails?: A Little Help needed moving from lying on your back to sitting on the side of a flat bed without using bedrails?: A Lot Help needed moving to and from a bed to a chair (including a wheelchair)?: A Lot Help needed standing up from a chair using your arms (e.g., wheelchair or bedside chair)?: A Little Help needed to walk in hospital room?: A Lot Help needed climbing 3-5 steps with a railing? : Total 6 Click Score: 13    End of Session Equipment Utilized During Treatment: Gait belt Activity Tolerance: Patient tolerated treatment well Patient left: in chair;with call bell/phone within reach;with chair alarm set;with family/visitor present Nurse Communication: Mobility status PT Visit Diagnosis: Other abnormalities of gait and mobility (R26.89);Pain;Muscle weakness (generalized) (M62.81) Pain - Right/Left: Left Pain - part of body: Leg     Time: 1062-6948 PT Time Calculation (min) (ACUTE ONLY): 28 min  Charges:  $Therapeutic Exercise: 8-22 mins $Therapeutic Activity: 8-22 mins                     Samuel Rittenhouse B. Migdalia Dk PT, DPT Acute Rehabilitation Services Pager [phone] Office [phone]    [place] 05/15/2021, 5:09 PM

## 2021-05-15 NOTE — Progress Notes (Addendum)
Advanced Heart Failure Rounding Note  PCP-Cardiologist: None   Subjective:    Denies chest pain. Having L stump pain.   Objective:   Weight Range: 91.3 kg Body mass index is 28.07 kg/m.   Vital Signs:   Temp:  [98 F (36.7 C)-98.9 F (37.2 C)] 98.9 F (37.2 C) (07/11 0546) Pulse Rate:  [96-103] 101 (07/11 0546) Resp:  [17-20] 20 (07/11 0546) BP: (116-135)/(68-78) 125/69 (07/11 0546) SpO2:  [91 %-96 %] 95 % (07/11 0546) Weight:  [91.3 kg] 91.3 kg (07/11 0546) Last BM Date: 05/13/21  Weight change: Filed Weights   05/12/21 0823 05/12/21 1520 05/15/21 0546  Weight: 89 kg 90 kg 91.3 kg    Intake/Output:   Intake/Output Summary (Last 24 hours) at 05/15/2021 0835 Last data filed at 05/14/2021 1944 Gross per 24 hour  Intake 589.61 ml  Output 175 ml  Net 414.61 ml      Physical Exam    General:  No resp difficulty HEENT: Normal Neck: Supple. JVP 9-10 . Carotids 2+ bilat; no bruits. No lymphadenopathy or thyromegaly appreciated. Cor: PMI nondisplaced. Regular rate & rhythm. No rubs, gallops or murmurs. Lungs: Clear Abdomen: Soft, nontender, nondistended. No hepatosplenomegaly. No bruits or masses. Good bowel sounds. Extremities: No cyanosis, clubbing, rash, LBKA. RLE 1+ edema Neuro: Alert & orientedx3, cranial nerves grossly intact. moves all 4 extremities w/o difficulty. Affect pleasant   Telemetry     EKG   N/a  Labs    CBC Recent Labs    05/13/21 0159 05/14/21 0145  WBC 9.5 11.2*  HGB 8.0* 8.4*  HCT 25.4* 27.4*  MCV 98.8 100.7*  PLT 175 619   Basic Metabolic Panel Recent Labs    05/13/21 0159 05/14/21 0145  NA 134* 133*  K 4.0 4.7  CL 99 97*  CO2 27 27  GLUCOSE 209* 204*  BUN 33* 50*  CREATININE 3.95* 5.13*  CALCIUM 8.3* 8.5*  MG  --  2.0  PHOS 3.2  --    Liver Function Tests No results for input(s): AST, ALT, ALKPHOS, BILITOT, PROT, ALBUMIN in the last 72 hours. No results for input(s): LIPASE, AMYLASE in the last 72  hours. Cardiac Enzymes No results for input(s): CKTOTAL, CKMB, CKMBINDEX, TROPONINI in the last 72 hours.  BNP: BNP (last 3 results) Recent Labs    04/10/21 1033  BNP 4,239.3*    ProBNP (last 3 results) No results for input(s): PROBNP in the last 8760 hours.   D-Dimer No results for input(s): DDIMER in the last 72 hours. Hemoglobin A1C No results for input(s): HGBA1C in the last 72 hours. Fasting Lipid Panel No results for input(s): CHOL, HDL, LDLCALC, TRIG, CHOLHDL, LDLDIRECT in the last 72 hours. Thyroid Function Tests No results for input(s): TSH, T4TOTAL, T3FREE, THYROIDAB in the last 72 hours.  Invalid input(s): FREET3  Other results:   Imaging    DG CHEST PORT 1 VIEW  Result Date: 05/14/2021 CLINICAL DATA:  65 year old male with cough. EXAM: PORTABLE CHEST 1 VIEW COMPARISON:  Chest radiograph dated 04/10/2021. FINDINGS: Bilateral mid to lower lung field streaky densities, new since the prior radiograph and may represent atypical infiltrate, edema, or atelectasis. Trace bilateral pleural effusions suspected. No pneumothorax. Stable cardiomegaly. Atherosclerotic calcification of the aorta. Median sternotomy wires. No acute osseous pathology. IMPRESSION: Bilateral mid to lower lung field streaky densities may represent atypical infiltrate, edema, or atelectasis. Electronically Signed   By: Anner Crete M.D.   On: 05/14/2021 19:41     Medications:  Scheduled Medications:  aspirin EC  81 mg Oral Daily   atorvastatin  80 mg Oral Daily   calcitRIOL  0.25 mcg Oral BID   Chlorhexidine Gluconate Cloth  6 each Topical Q0600   clopidogrel  75 mg Oral Daily   darbepoetin ([name]) injection - DIALYSIS  150 mcg Intravenous Q Wed-HD   escitalopram  10 mg Oral Daily   ferric citrate  630 mg Oral TID   heparin  5,000 Units Subcutaneous Q8H   insulin aspart  0-6 Units Subcutaneous TID WC   insulin glargine  5 Units Subcutaneous QHS   midodrine  5 mg Oral Q M,W,F-HD    nutrition supplement (JUVEN)  1 packet Oral BID BM   pantoprazole  40 mg Oral Daily   pentoxifylline  400 mg Oral TID WC   sodium chloride flush  3 mL Intravenous Q12H   sorbitol  30 mL Oral BID   tamsulosin  0.4 mg Oral QHS   zinc sulfate  220 mg Oral Daily    Infusions:   PRN Medications: acetaminophen **OR** acetaminophen, albuterol, alum & mag hydroxide-simeth, bisacodyl, clonazePAM, guaiFENesin-dextromethorphan, HYDROcodone-acetaminophen, HYDROmorphone (DILAUDID) injection, magnesium citrate, ondansetron (ZOFRAN) IV, oxyCODONE, phenol, polyethylene glycol, potassium chloride    Assessment/Plan  1. PAD with critical limb ischemia - s/p L BKA this admit   2. Acute on chronic Systolic Heart Failure due to iCM - Stress ECHO 02/2021 EF ~55%.  - ECHO 04/10/21 EF 30-35% - Cath 6/22 with severe CAD (as per HPI) - HD for volume management.  -Will likely need midodrine added back (was on previosuly) - Has been off B-blocker with low BP.  - No spiro /arni/dig/SGLT2i with ESRD  - Repeat limited ECHO to re-check EF   3. CAD - H/O CABG 2021 - Cath 6/22 with severe CAD (as per HPI). No revasc options - No s/s angina - HS Trop [id number]. - Continue DAPT/statin - BP has been too low for b-blocker   4. ESRD - Previously on PD but ineffective. Switched iHD - Nephrology following - plan as above -> push dry weight    Length of Stay: [place], NP  05/15/2021, 8:35 AM  Advanced Heart Failure Team Pager [phone] (M-F; 7a - 5p)  Please contact [facility] for night-coverage after hours (5p -7a ) and weekends on [email or web address]  Patient seen and examined with the above-signed Advanced Practice Provider and/or Housestaff. I personally reviewed laboratory data, imaging studies and relevant notes. I independently examined the patient and formulated the important aspects of the plan. I have edited the note to reflect any of my changes or salient points. I have personally discussed the  plan with the patient and/or family.  Had HD with 2.5L off. Given midodrine prior and tolerated ok. Feels breathing improved.   Echo today EF 25-30% mild RV HK. Personally reviewed  General:  Sitting up in bed No resp difficulty HEENT: normal Neck: supple. JVP 9. Carotids 2+ bilat; no bruits. No lymphadenopathy or thryomegaly appreciated. Cor: PMI nondisplaced. Regular rate & rhythm. No rubs, gallops or murmurs. Lungs: clear Abdomen: obese soft, nontender, nondistended. No hepatosplenomegaly. No bruits or masses. Good bowel sounds. Extremities: no cyanosis, clubbing, rash, 1+ edema on R. L BKA  Neuro: alert & orientedx3, cranial nerves grossly intact. moves all 4 extremities w/o difficulty. Affect pleasant  Volume status improved with HD. Still with some volume overload. Hopefully can push dry weight down a bit more with midodrine support.   Echo with severe LV dysfunction.  No targets for revascularization on cath. BP too low to tolerate GDMT. Trajectory is concerning.   Glori Bickers, MD  5:31 PM

## 2021-05-15 NOTE — Progress Notes (Signed)
PROGRESS NOTE   Maxwell Harmon  PZW:[id number] DOB: [birth year] DOA: 05/07/2021 PCP: [name], DO  Brief Narrative:  65 year old white male CAD + CABG 2021 Charcot foot ESRD (prior malignancy status post nephrectomy) previously on peritoneal dialysis Chronic L LE diabetic wound previously followed by podiatry >5 months/cellulitis 04/19/2021 had angiogram with revascularization left popliteal, left posterior tibial artery with ICU stay 04/2021--discharged on antibiotics--underwent cath that time with no options for revascularization  Readmit 05/07/2021 worsening pain discharge erythema Lactic acid normal COVID-negative urinalysis negative Left foot x-ray showed no evidence of osteomyelitis  Orthopedic surgeon Dr. Sharol Given consulted Vascular surgeon Dr. Oneida Alar consulted  Amputation L bka done 05/12/2021 Patient being evaluated by therapy services and likely will need rehab on discharge   Hospital-Problem based course  Diabetic foot wound nonhealing left heel ulcer Doppler study=75 to 99% stenosed popliteal artery left side BKA 05/12/2021 under Dr. Tonia Brooms vac x 1 week-nonweightbearing left extremity Trental 400 3 times daily--continue hydrocodone 1-2 tab every 4--as per surgery-Dilaudid for breakthrough 0.5 every 4 as needed Pain is present - moderately controlled Antibiotics discontinued 7/9  Cough CXR 7/11 questionable fluid-questionable infiltrate-more likely fluid to my read Challenge EDW as feel this is volume overload SOB/DOE improved on HD unit 7/11 although pressure dropped-see below Leukocytosis-persistent Secondary to ulcer-monitor trends periodically of white count Nausea this a.m.-significant constipation Having stool now ESRD in [place] MWF [previously on PD]-transitioning to HD EDW challenged 7/11 @ HD--midodrine given during HD 7/11 with amputation performed may be able to get IV iron? Superimposed acute blood loss anemia expected from amputation surgery Anemia  of renal disease [name] as per nephrology -continue Auryxia 630 3 times daily 1 unit PRBC transfused 7/7 per Ortho--hb acceptable at this time--transfusion threshold below 7 IV iron as per renal DM TY 2 with nephropathy neuropathy with hypoglycemia earlier in hospital stay Uses continuous monitor at home Lantus increased from 2 units-->5 units 7/10  CBG currently ranging 122 through 165 Anxiety-situational Continue Lexapro 10, Klonopin 0.5 twice daily prn  DVT prophylaxis: Heparin Code Status: Full Family Communication:  no family present patient seen on HD unit-we will update patient's son-in-law-- Dr. Ninfa Linden in the next day or so Disposition:  Status is: Inpatient  Remains inpatient appropriate because:Hemodynamically unstable and Unsafe d/c plan  Dispo: The patient is from: Home              Anticipated d/c is to: SNF              Patient currently is not medically stable to d/c.   Difficult to place patient No     Consultants:  Vascular surgery Dr. Oneida Alar Orthopedic surgery Dr. Sharol Given  Procedures:   7.8.22-BKA Dr. Sharol Given  Antimicrobials:   Vancomycin, cefepime, Flagyl since admission   Subjective:  Seen on HD unit-slightly sleepy States that shortness of breath is improved - Chest pain - fever - chills He has some pain in his amputation site  Objective: Vitals:   05/15/21 1130 05/15/21 1200 05/15/21 1230 05/15/21 1304  BP: 99/60 (!) 97/57 118/64 136/68  Pulse: 91 91  96  Resp: 17  16 18   Temp:   98.3 F (36.8 C)   TempSrc:   Oral   SpO2:   98% 90%  Weight:   90.5 kg   Height:        Intake/Output Summary (Last 24 hours) at 05/15/2021 1351 Last data filed at 05/15/2021 1230 Gross per 24 hour  Intake 715 ml  Output 3175 ml  Net -2460 ml    Filed Weights   05/15/21 0546 05/15/21 0916 05/15/21 1230  Weight: 91.3 kg 92.9 kg 90.5 kg    Examination:  Coherent pleasant EOMI NCAT no focal deficit S1-S2 no murmur LUSB Abdomen soft Getting HD  through LUE fistula Amputation site not examined Neurologically grossly intact moving all 4 limbs I cannot appreciate significant lower extremity edema   Data Reviewed: personally reviewed   CBC    Component Value Date/Time   WBC 10.7 (H) 05/15/2021 0837   RBC 2.70 (L) 05/15/2021 0837   HGB 8.2 (L) 05/15/2021 0837   HCT 27.3 (L) 05/15/2021 0837   PLT 190 05/15/2021 0837   MCV 101.1 (H) 05/15/2021 0837   MCH 30.4 05/15/2021 0837   MCHC 30.0 05/15/2021 0837   RDW 16.5 (H) 05/15/2021 0837   LYMPHSABS 1.3 05/12/2021 0343   MONOABS 0.9 05/12/2021 0343   EOSABS 0.1 05/12/2021 0343   BASOSABS 0.0 05/12/2021 0343   CMP Latest Ref Rng & Units 05/15/2021 05/14/2021 05/13/2021  Glucose 70 - 99 mg/dL 179(H) 204(H) 209(H)  BUN 8 - 23 mg/dL 75(H) 50(H) 33(H)  Creatinine 0.61 - 1.24 mg/dL 6.16(H) 5.13(H) 3.95(H)  Sodium 135 - 145 mmol/L 127(L) 133(L) 134(L)  Potassium 3.5 - 5.1 mmol/L 4.8 4.7 4.0  Chloride 98 - 111 mmol/L 94(L) 97(L) 99  CO2 22 - 32 mmol/L 24 27 27   Calcium 8.9 - 10.3 mg/dL 8.5(L) 8.5(L) 8.3(L)  Total Protein 6.5 - 8.1 g/dL - - -  Total Bilirubin 0.3 - 1.2 mg/dL - - -  Alkaline Phos 38 - 126 U/L - - -  AST 15 - 41 U/L - - -  ALT 0 - 44 U/L - - -     Radiology Studies: DG CHEST PORT 1 VIEW  Result Date: 05/14/2021 CLINICAL DATA:  65 year old male with cough. EXAM: PORTABLE CHEST 1 VIEW COMPARISON:  Chest radiograph dated 04/10/2021. FINDINGS: Bilateral mid to lower lung field streaky densities, new since the prior radiograph and may represent atypical infiltrate, edema, or atelectasis. Trace bilateral pleural effusions suspected. No pneumothorax. Stable cardiomegaly. Atherosclerotic calcification of the aorta. Median sternotomy wires. No acute osseous pathology. IMPRESSION: Bilateral mid to lower lung field streaky densities may represent atypical infiltrate, edema, or atelectasis. Electronically Signed   By: Anner Crete M.D.   On: 05/14/2021 19:41     Scheduled  Meds:  aspirin EC  81 mg Oral Daily   atorvastatin  80 mg Oral Daily   calcitRIOL  0.25 mcg Oral BID   Chlorhexidine Gluconate Cloth  6 each Topical Q0600   clopidogrel  75 mg Oral Daily   darbepoetin ([name]) injection - DIALYSIS  150 mcg Intravenous Q Wed-HD   escitalopram  10 mg Oral Daily   ferric citrate  630 mg Oral TID   heparin  5,000 Units Subcutaneous Q8H   insulin aspart  0-6 Units Subcutaneous TID WC   insulin glargine  5 Units Subcutaneous QHS   midodrine       midodrine  5 mg Oral Q M,W,F-HD   nutrition supplement (JUVEN)  1 packet Oral BID BM   pantoprazole  40 mg Oral Daily   pentoxifylline  400 mg Oral TID WC   sodium chloride flush  3 mL Intravenous Q12H   sorbitol  30 mL Oral BID   tamsulosin  0.4 mg Oral QHS   zinc sulfate  220 mg Oral Daily   Continuous Infusions:     LOS: 7 days  Time spent: 47  Nita Sells, MD [facility] To contact the attending provider between 7A-7P or the covering provider during after hours 7P-7A, please log into the web site [email or web address] and access using universal [place] password for that web site. If you do not have the password, please call the hospital operator.  05/15/2021, 1:51 PM

## 2021-05-15 NOTE — Progress Notes (Signed)
Tolerated well without issue. 2.5L removed with HD. BP remained soft throughout. Received am midodrine dose as well. Report called to 38M. Patient currently resting without complaints.

## 2021-05-15 NOTE — Progress Notes (Signed)
[facility] Progress Note    Assessment/ Plan:   ESRD: has been on HD due to issues with CCPD: inadequate drainage/UF (catheter malfunction?) Outpatient orders: [place], MWF, F180, 3hrs, 2k, 2.5cal, 137 na, 35bicarb, 400bfr, 16g, edw 86.5kg. hep 2k bolus -discussed with his outpatient home therapies RN 7/5: no plans for further PD and is being scheduled to have PD cath removed as an outpatient, therefore no PD cath flushing needed. -HD today, maintaining mwf schedule   Cellulitis/diabetic foot, progressive gangrene -off abx per primary service, ortho and vvs consulted. 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 [name]   Volume/ hypertension: EDW 86.5kg pre amputation. needs new edw post amputation-Will challenge EDW today   Anemia of Chronic Kidney Disease: Hemoglobin 8.2. Avoiding IV iron for now, receives mircera 150 mcg, last dose 6/24, ordered aranesp here. Transfuse prn   Secondary Hyperparathyroidism/Hyperphosphatemia: continue with home meds, renal diet, monitor phos   DM2 w/ hyperglycemia -mgmt per primary service  Hypoalbuminemia, protein calorie malnutrition -push protein    # Additional recommendations: - Dose all meds for creatinine clearance < 10 ml/min  - Unless absolutely necessary, no MRIs with gadolinium.  - Implement save arm precautions.  Prefer needle sticks in the dorsum of the hands or wrists.  No blood pressure measurements in arm. - If blood transfusion is requested during hemodialysis sessions, please alert [place] prior to the session. - If a hemodialysis catheter line culture is requested, please alert [place] as only hemodialysis nurses are able to collect those specimens.    Jannifer Hick MD [facility] Pager [phone]   Subjective:   Seen and examined bedside. CXR yesterday with basilar infiltrate vs edema vs atelectasis. Says cough better today.  Tolerating HD fine currently.    Objective:   BP 113/62 (BP Location: Right Arm)    Pulse 95   Temp 98.2 F (36.8 C) (Oral)   Resp 16   Ht 5\' 11"  (1.803 m)   Wt 92.9 kg   SpO2 95%   BMI 28.56 kg/m   Intake/Output Summary (Last 24 hours) at 05/15/2021 1013 Last data filed at 05/15/2021 0900 Gross per 24 hour  Intake 949.61 ml  Output 175 ml  Net 774.61 ml    Weight change:   Physical Exam: Gen:nad, nontoxic appearing CVS:rrr Resp:normal wob, no acc muscle use, speaking in full sentences Abd:pd cath in place, soft, nt/nd YIR:SWNIO RLE edema, LLE amputated Neuro: awake, alert, speech clear and coherent, moves all ext spontaneously Access: avf +b/t   Imaging: DG CHEST PORT 1 VIEW  Result Date: 05/14/2021 CLINICAL DATA:  65 year old male with cough. EXAM: PORTABLE CHEST 1 VIEW COMPARISON:  Chest radiograph dated 04/10/2021. FINDINGS: Bilateral mid to lower lung field streaky densities, new since the prior radiograph and may represent atypical infiltrate, edema, or atelectasis. Trace bilateral pleural effusions suspected. No pneumothorax. Stable cardiomegaly. Atherosclerotic calcification of the aorta. Median sternotomy wires. No acute osseous pathology. IMPRESSION: Bilateral mid to lower lung field streaky densities may represent atypical infiltrate, edema, or atelectasis. Electronically Signed   By: Anner Crete M.D.   On: 05/14/2021 19:41    Labs: BMET Recent Labs  Lab 05/12/21 0343 05/13/21 0159 05/14/21 0145 05/15/21 0837  NA 130* 134* 133* 127*  K 3.9 4.0 4.7 4.8  CL 93* 99 97* 94*  CO2 30 27 27 24   GLUCOSE 183* 209* 204* 179*  BUN 54* 33* 50* 75*  CREATININE 5.77* 3.95* 5.13* 6.16*  CALCIUM 8.4* 8.3* 8.5* 8.5*  PHOS  --  3.2  --  3.4    CBC Recent Labs  Lab 05/12/21 0343 05/13/21 0159 05/14/21 0145 05/15/21 0837  WBC 14.3* 9.5 11.2* 10.7*  NEUTROABS 11.9*  --   --   --   HGB 8.3* 8.0* 8.4* 8.2*  HCT 26.2* 25.4* 27.4* 27.3*  MCV 97.0 98.8 100.7* 101.1*  PLT 186 175 178 190     Medications:     aspirin EC  81 mg Oral Daily    atorvastatin  80 mg Oral Daily   calcitRIOL  0.25 mcg Oral BID   Chlorhexidine Gluconate Cloth  6 each Topical Q0600   clopidogrel  75 mg Oral Daily   darbepoetin ([name]) injection - DIALYSIS  150 mcg Intravenous Q Wed-HD   escitalopram  10 mg Oral Daily   ferric citrate  630 mg Oral TID   heparin  5,000 Units Subcutaneous Q8H   insulin aspart  0-6 Units Subcutaneous TID WC   insulin glargine  5 Units Subcutaneous QHS   midodrine       midodrine  5 mg Oral Q M,W,F-HD   nutrition supplement (JUVEN)  1 packet Oral BID BM   pantoprazole  40 mg Oral Daily   pentoxifylline  400 mg Oral TID WC   sodium chloride flush  3 mL Intravenous Q12H   sorbitol  30 mL Oral BID   tamsulosin  0.4 mg Oral QHS   zinc sulfate  220 mg Oral Daily

## 2021-05-15 NOTE — Plan of Care (Signed)
  Problem: Clinical Measurements: Goal: Ability to avoid or minimize complications of infection will improve Outcome: Progressing   Problem: Skin Integrity: Goal: Skin integrity will improve Outcome: Progressing   Problem: Clinical Measurements: Goal: Ability to maintain clinical measurements within normal limits will improve Outcome: Progressing   Problem: Activity: Goal: Risk for activity intolerance will decrease Outcome: Progressing   Problem: Coping: Goal: Level of anxiety will decrease Outcome: Progressing   Problem: Pain Managment: Goal: General experience of comfort will improve Outcome: Progressing   Problem: Safety: Goal: Ability to remain free from injury will improve Outcome: Progressing

## 2021-05-15 NOTE — Progress Notes (Signed)
OT Cancellation Note  Patient Details Name: Maxwell Harmon MRN: [id number] DOB: October 10, 1956   Cancelled Treatment:    Reason Eval/Treat Not Completed: Patient at procedure or test/ unavailable Pt currently off the unit at HD. OT will return as time allows and pt is appropriate.   [facility] OTR/L Acute Rehabilitation Services Office: [place] 05/15/2021, 12:52 PM

## 2021-05-15 NOTE — Progress Notes (Signed)
Patient is postop 3 status post below-knee amputation he is lying in bed comfortable eating breakfast.  Wound VAC is functioning 1 green check.  No further drainage in canister   Patient is pending transfer to hopefully inpatient rehab versus skilled nursing.  From orthopedic standpoint could be transferred when bed available will need 1 to 2-week visit after he is discharged visit at our office

## 2021-05-16 DIAGNOSIS — I5023 Acute on chronic systolic (congestive) heart failure: Secondary | ICD-10-CM | POA: Diagnosis not present

## 2021-05-16 LAB — GLUCOSE, CAPILLARY
Glucose-Capillary: 172 mg/dL — ABNORMAL HIGH (ref 70–99)
Glucose-Capillary: 139 mg/dL — ABNORMAL HIGH (ref 70–99)
Glucose-Capillary: 151 mg/dL — ABNORMAL HIGH (ref 70–99)
Glucose-Capillary: 207 mg/dL — ABNORMAL HIGH (ref 70–99)

## 2021-05-16 MED ORDER — HYDROMORPHONE HCL 2 MG PO TABS
1.0000 mg | ORAL_TABLET | Freq: Four times a day (QID) | ORAL | Status: DC | PRN
  Administered 2021-05-16 – 2021-05-17 (×2): 1 mg via ORAL
  Filled 2021-05-16 (×2): qty 1

## 2021-05-16 MED ORDER — LORAZEPAM 2 MG/ML IJ SOLN
0.5000 mg | Freq: Once | INTRAMUSCULAR | Status: AC
  Administered 2021-05-16: 0.5 mg via INTRAVENOUS
  Filled 2021-05-16: qty 1

## 2021-05-16 NOTE — Progress Notes (Signed)
Inpatient Rehab Admissions Coordinator:   Pt. Remains an appropriate candidate for [name]. I opened a case with pt.'s insurance yesterday and continue to await a response.   Clemens Catholic, [place], [place] Admissions Coordinator  [phone] ([place]) [phone] (office)

## 2021-05-16 NOTE — Progress Notes (Signed)
Occupational Therapy Treatment Patient Details Name: Maxwell Harmon MRN: [id number] DOB: 1956/01/08 Today's Date: 05/16/2021    History of present illness Pt is a 65 y.o. male admitted 7/3 with diagnosis of cellulitis LLE. Pt presented with gangrenous ischemic changes of the left heel ulcer. 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 PMH: CHF, ESRD (switched from PD to HB 04/2021), hypertension, CAD s/p CABG 2021, PAD h/o limb ischemia, Charcot foot, hyperlipidemia, diabetes, TIA   OT comments  Pt received in bed, agreeable to OT session. Pt presented with flat affect throughout today's session. Educated him on the grieving process associated with an amputations, available resources, and healthy coping strategies. Pt is highly motivated to progress to an appropriate level to return home and is excited to d/c to [name]. Pt currently requires minguard for bed mobility, modA to powerup into standing and pivot to recliner. He requires setup assistance for grooming while seated at sink level. Pt will continue to benefit from skilled OT services to maximize safety and independence with ADL/IADL and functional mobility. Will continue to follow acutely and progress as tolerated.    Follow Up Recommendations  [name]    Equipment Recommendations  Wheelchair (measurements OT);Wheelchair cushion (measurements OT);3 in 1 bedside commode    Recommendations for Other Services Rehab consult    Precautions / Restrictions Precautions Precautions: Fall Precaution Comments: charcot foot, ulcer left heel Required Braces or Orthoses: Other Brace Other Brace: Limb Guard Restrictions Weight Bearing Restrictions: Yes LLE Weight Bearing: Non weight bearing Other Position/Activity Restrictions: limb protector for mobility       Mobility Bed Mobility Overal bed mobility: Needs Assistance Bed Mobility: Sit to Supine     Supine to sit: HOB elevated;Min guard     General bed mobility comments: minguard with HOB elevated     Transfers Overall transfer level: Needs assistance Equipment used: Rolling walker (2 wheeled) Transfers: Sit to/from [facility] Sit to Stand: From elevated surface;Mod assist Stand pivot transfers: Mod assist       General transfer comment: cues for safe hand placement, initial attempt to stand unsuccessful secondary to pt's right foot sliding out. Provided pt with his shoe. 2nd attempt to stand successful and pt required moderate assistance fom an elevated surface. He then required moderate assistance to hop x4 toward the right with assistance for RW management for optimal tricep engagement with progression. Pt with decreased activity tolerance and fatigues quickly.    Balance Overall balance assessment: Needs assistance Sitting-balance support: Feet supported;No upper extremity supported Sitting balance-Leahy Scale: Good     Standing balance support: During functional activity;Bilateral upper extremity supported Standing balance-Leahy Scale: Poor Standing balance comment: heavy reliance on BUE and external support                           ADL either performed or assessed with clinical judgement   ADL Overall ADL's : Needs assistance/impaired Eating/Feeding: Independent;Sitting Eating/Feeding Details (indicate cue type and reason): pt finished eating breakfast upon arrival Grooming: Set up;Sitting Grooming Details (indicate cue type and reason): at sink level while seated in the recliner             Lower Body Dressing: Minimal assistance Lower Body Dressing Details (indicate cue type and reason): minA to don limb guard while long sitting in bed Toilet Transfer: Moderate assistance;Stand-pivot;RW Toilet Transfer Details (indicate cue type and reason): simulated from EOB to recliner toward right         Functional mobility  during ADLs: Moderate assistance;Rolling walker General ADL Comments: pt limited by instability, decreased activity  tolerance, new LLE BKA     Vision       Perception     Praxis      Cognition Arousal/Alertness: Awake/alert Behavior During Therapy: WFL for tasks assessed/performed;Flat affect Overall Cognitive Status: Within Functional Limits for tasks assessed                                 General Comments: educated pt on grieving process associated with having an amputation. Pt with flat affect throughout the session        Exercises Other Exercises Other Exercises: continue to reinforce chair pushups and HEP with provided handout and theraband   Shoulder Instructions       General Comments HR 106bpm with exertion,    Pertinent Vitals/ Pain       Pain Assessment: 0-10 Pain Score: 3  Pain Location: LLE Pain Descriptors / Indicators: Sore;Operative site guarding Pain Intervention(s): Limited activity within patient's tolerance;Monitored during session  Home Living                                          Prior Functioning/Environment              Frequency  Min 2X/week        Progress Toward Goals  OT Goals(current goals can now be found in the care plan section)  Progress towards OT goals: Progressing toward goals  Acute Rehab OT Goals Patient Stated Goal: I need to get stronger so I can do more for myself OT Goal Formulation: With patient Time For Goal Achievement: 05/27/21 Potential to Achieve Goals: Good ADL Goals Pt Will Perform Lower Body Bathing: with min assist;sitting/lateral leans Pt Will Perform Lower Body Dressing: with min assist;sitting/lateral leans Pt Will Transfer to Toilet: with min assist;stand pivot transfer;bedside commode Pt Will Perform Toileting - Clothing Manipulation and hygiene: sit to/from stand;with min assist Additional ADL Goal #1: Patient will increase stand tolerance at RW to 2 min to increase independence with toileting.  Plan Discharge plan remains appropriate    Co-evaluation                  AM-PAC OT "6 Clicks" Daily Activity     Outcome Measure   Help from another person eating meals?: None Help from another person taking care of personal grooming?: None Help from another person toileting, which includes using toliet, bedpan, or urinal?: A Lot Help from another person bathing (including washing, rinsing, drying)?: A Lot Help from another person to put on and taking off regular upper body clothing?: A Little Help from another person to put on and taking off regular lower body clothing?: A Lot 6 Click Score: 17    End of Session Equipment Utilized During Treatment: Gait belt  OT Visit Diagnosis: Unsteadiness on feet (R26.81);Muscle weakness (generalized) (M62.81);History of falling (Z91.81);Pain Pain - Right/Left: Left Pain - part of body: Leg   Activity Tolerance Patient tolerated treatment well   Patient Left in bed;with call bell/phone within reach;with bed alarm set   Nurse Communication Mobility status        Time: 3662-9476 OT Time Calculation (min): 39 min  Charges: OT General Charges $OT Visit: 1 Visit OT Treatments $Self Care/Home Management : 38-52 mins  [facility] OTR/L Acute Rehabilitation Services Office: [place] 05/16/2021, 11:09 AM

## 2021-05-16 NOTE — Progress Notes (Signed)
Pt. State he was concerned he had not void. Usually voids a few times throughout the day. Pt. Only void once yesterday. Nurse bladder scan pt. Scan stated 624. Provider notified. New orders place. Nurse will continue to monitor.

## 2021-05-16 NOTE — Progress Notes (Addendum)
Advanced Heart Failure Rounding Note  PCP-Cardiologist: None   Subjective:    7/11 Had ECHO with EF 25-30% and mild RV dysfunction.   Concerned he is not urinating. Says he normally urinates several times a day with small amount of urine . Denies chest pain.   Objective:   Weight Range: 90.5 kg Body mass index is 27.83 kg/m.   Vital Signs:   Temp:  [97.7 F (36.5 C)-98.6 F (37 C)] 98.6 F (37 C) (07/12 0543) Pulse Rate:  [88-100] 88 (07/12 0543) Resp:  [16-18] 18 (07/12 0543) BP: (88-136)/(57-74) 117/74 (07/12 0543) SpO2:  [90 %-98 %] 95 % (07/12 0543) Weight:  [90.5 kg-92.9 kg] 90.5 kg (07/11 1230) Last BM Date: 05/14/21  Weight change: Filed Weights   05/15/21 0546 05/15/21 0916 05/15/21 1230  Weight: 91.3 kg 92.9 kg 90.5 kg    Intake/Output:   Intake/Output Summary (Last 24 hours) at 05/16/2021 0825 Last data filed at 05/16/2021 0700 Gross per 24 hour  Intake 1263 ml  Output 3550 ml  Net -2287 ml      Physical Exam    General:  Sitting on the side of the bed. No resp difficulty HEENT: normal Neck: supple. no JVD. Carotids 2+ bilat; no bruits. No lymphadenopathy or thryomegaly appreciated. Cor: PMI nondisplaced. Regular rate & rhythm. No rubs, gallops or murmurs. Lungs: clear Abdomen: soft, nontender, nondistended. No hepatosplenomegaly. No bruits or masses. Good bowel sounds. Extremities: no cyanosis, clubbing, rash, edema. LBKA with VAD dressing.  Neuro: alert & orientedx3, cranial nerves grossly intact. moves all 4 extremities w/o difficulty. Affect flat   Telemetry   SR-ST 90-100s   EKG   N/a  Labs    CBC Recent Labs    05/14/21 0145 05/15/21 0837  WBC 11.2* 10.7*  HGB 8.4* 8.2*  HCT 27.4* 27.3*  MCV 100.7* 101.1*  PLT 178 409   Basic Metabolic Panel Recent Labs    05/14/21 0145 05/15/21 0837  NA 133* 127*  K 4.7 4.8  CL 97* 94*  CO2 27 24  GLUCOSE 204* 179*  BUN 50* 75*  CREATININE 5.13* 6.16*  CALCIUM 8.5* 8.5*  MG  2.0  --   PHOS  --  3.4   Liver Function Tests Recent Labs    05/15/21 0837  ALBUMIN 1.8*   No results for input(s): LIPASE, AMYLASE in the last 72 hours. Cardiac Enzymes No results for input(s): CKTOTAL, CKMB, CKMBINDEX, TROPONINI in the last 72 hours.  BNP: BNP (last 3 results) Recent Labs    04/10/21 1033  BNP 4,239.3*    ProBNP (last 3 results) No results for input(s): PROBNP in the last 8760 hours.   D-Dimer No results for input(s): DDIMER in the last 72 hours. Hemoglobin A1C No results for input(s): HGBA1C in the last 72 hours. Fasting Lipid Panel No results for input(s): CHOL, HDL, LDLCALC, TRIG, CHOLHDL, LDLDIRECT in the last 72 hours. Thyroid Function Tests No results for input(s): TSH, T4TOTAL, T3FREE, THYROIDAB in the last 72 hours.  Invalid input(s): FREET3  Other results:   Imaging    ECHOCARDIOGRAM LIMITED  Result Date: 05/15/2021    ECHOCARDIOGRAM LIMITED REPORT   Patient Name:   AL[place] CURRENT Date of Exam: 05/15/2021 Medical Rec #:  [id number]       Height:       71.0 in Accession #:    [id number]      Weight:       199.5 lb Date of Birth:  1955/12/29  BSA:          2.106 m Patient Age:    25 years        BP:           136/68 mmHg Patient Gender: M               HR:           99 bpm. Exam Location:  Inpatient Procedure: 2D Echo and Intracardiac Opacification Agent Indications:    I50.9* Heart failure (unspecified)  History:        Patient has prior history of Echocardiogram examinations, most                 recent 04/10/2021. Mild aortic stenosis. S/P foot amputation.  Sonographer:    Merrie Roof RDCS Referring Phys: [phone] AMY D CLEGG IMPRESSIONS  1. Left ventricular ejection fraction, by estimation, is 25 to 30%. Left ventricular ejection fraction by 3D volume is 30 %. The left ventricle has severely decreased function. The left ventricle demonstrates regional wall motion abnormalities (see scoring diagram/findings for description). There is severe  hypokinesis of the mid-to-apical inferior, mid-to-apical septal, mid-to-apical anterior, apical lateral LV segments. The apical segments and apex appear akinetic. There is no LV thrombus visualized. The left ventricular internal cavity size was mildly dilated.  2. Right ventricular systolic function is moderately reduced. The right ventricular size is normal.  3. The mitral valve is degenerative. Moderate mitral annular calcification.  4. The inferior vena cava is dilated in size with <50% respiratory variability, suggesting right atrial pressure of 15 mmHg. Comparison(s): Compared to prior TTE in 04/2021, there is no significant change. FINDINGS  Left Ventricle: Left ventricular ejection fraction, by estimation, is 25 to 30%. Left ventricular ejection fraction by 3D volume is 30 %. The left ventricle has severely decreased function. The left ventricle demonstrates regional wall motion abnormalities. There is severe hypokinesis of the mid-to-apical inferior, mid-to-apical septal, mid-to-apical anterior, apical lateral LV segments. The apical segments and apex appear akinetic. There is no LV thrombus visualized. Definity contrast agent was given IV to delineate the left ventricular endocardial borders. The left ventricular internal cavity size was mildly dilated. There is no left ventricular hypertrophy. Right Ventricle: The right ventricular size is normal. Right ventricular systolic function is moderately reduced. Pericardium: There is no evidence of pericardial effusion. Mitral Valve: The mitral valve is degenerative in appearance. There is mild thickening of the mitral valve leaflet(s). There is mild calcification of the mitral valve leaflet(s). Moderate mitral annular calcification. Tricuspid Valve: The tricuspid valve is normal in structure. Venous: The inferior vena cava is dilated in size with less than 50% respiratory variability, suggesting right atrial pressure of 15 mmHg. LEFT VENTRICLE PLAX 2D LVIDd:          6.00 cm LVIDs:         5.00 cm LV PW:         1.00 cm         3D Volume EF LV IVS:        1.00 cm         LV 3D EF:    Left                                             ventricular  ejection LV Volumes (MOD)                            fraction by LV vol d, MOD    234.0 ml                   3D volume A2C:                                        is 30 %. LV vol d, MOD    211.0 ml A4C: LV vol s, MOD    173.0 ml      3D Volume EF: A2C:                           3D EF:        30 % LV vol s, MOD    132.0 ml      LV EDV:       212 ml A4C:                           LV ESV:       148 ml LV SV MOD A2C:   61.0 ml       LV SV:        63 ml LV SV MOD A4C:   211.0 ml LV SV MOD BP:    67.9 ml IVC IVC diam: 2.20 cm LEFT ATRIUM         Index LA diam:    4.80 cm 2.28 cm/m   AORTA Ao Root diam: 3.30 cm Gwyndolyn Kaufman MD Electronically signed by Gwyndolyn Kaufman MD Signature Date/Time: 05/15/2021/5:44:10 PM    Final      Medications:     Scheduled Medications:  aspirin EC  81 mg Oral Daily   atorvastatin  80 mg Oral Daily   calcitRIOL  0.25 mcg Oral BID   Chlorhexidine Gluconate Cloth  6 each Topical Q0600   clopidogrel  75 mg Oral Daily   darbepoetin ([name]) injection - DIALYSIS  150 mcg Intravenous Q Wed-HD   escitalopram  10 mg Oral Daily   ferric citrate  630 mg Oral TID   heparin  5,000 Units Subcutaneous Q8H   insulin aspart  0-6 Units Subcutaneous TID WC   insulin glargine  5 Units Subcutaneous QHS   midodrine  5 mg Oral Q M,W,F-HD   nutrition supplement (JUVEN)  1 packet Oral BID BM   pantoprazole  40 mg Oral Daily   pentoxifylline  400 mg Oral TID WC   sodium chloride flush  3 mL Intravenous Q12H   sorbitol  30 mL Oral BID   tamsulosin  0.4 mg Oral QHS   zinc sulfate  220 mg Oral Daily    Infusions:   PRN Medications: acetaminophen **OR** acetaminophen, albuterol, alum & mag hydroxide-simeth, bisacodyl, clonazePAM, guaiFENesin-dextromethorphan,  HYDROcodone-acetaminophen, HYDROmorphone (DILAUDID) injection, magnesium citrate, ondansetron (ZOFRAN) IV, oxyCODONE, phenol, polyethylene glycol, potassium chloride    Assessment/Plan  1. PAD with critical limb ischemia - s/p L BKA this admit - Plan for [name]   2. Acute on chronic Systolic Heart Failure due to iCM - Stress ECHO 02/2021 EF ~55%.  - ECHO 04/10/21 EF 30-35%--> Repeat ECHO EF 25- 30% mild RV . dysfunction.  - Cath 6/22 with  severe CAD (as per HPI). No targets for revascularization.  - No room for GDMT with soft BP.  - HD for volume management. Continue midodrine on HD days.  - Has been off B-blocker with low BP.  - No spiro /arni/dig/SGLT2i with ESRD    3. CAD - H/O CABG 2021 - Cath 6/22 with severe CAD (as per HPI). No revasc options - No s/s angina - HS Trop [id number]. - Continue DAPT/statin - BP has been too low for b-blocker   4. ESRD - Previously on PD but ineffective. Switched iHD - Nephrology following -Volume status improved.     Length of Stay: [place], NP  05/16/2021, 8:25 AM  Advanced Heart Failure Team Pager [phone] (M-F; 7a - 5p)  Please contact [facility] for night-coverage after hours (5p -7a ) and weekends on [email or web address]  Patient seen and examined with the above-signed Advanced Practice Provider and/or Housestaff. I personally reviewed laboratory data, imaging studies and relevant notes. I independently examined the patient and formulated the important aspects of the plan. I have edited the note to reflect any of my changes or salient points. I have personally discussed the plan with the patient and/or family.  Volume status much better with recent HD. Cough improved. He is concerned about the fact that he is not peeing as much.   General:  Sitting in chair No resp difficulty HEENT: normal Neck: supple. JVP 6 Carotids 2+ bilat; no bruits. No lymphadenopathy or thryomegaly appreciated. Cor: PMI nondisplaced. Regular rate & rhythm. No  rubs, gallops or murmurs. Lungs: clear Abdomen: soft, nontender, nondistended. No hepatosplenomegaly. No bruits or masses. Good bowel sounds. Extremities: no cyanosis, clubbing, rash, tr edema on R s/p L BKA Neuro: alert & orientedx3, cranial nerves grossly intact. moves all 4 extremities w/o difficulty. Affect pleasant  Volume status much improved with pushing dry weight. HF/CAD meds limited by low BP.  I explained to him that he is likely urinating less due to the fact that volume status has improved.   Glori Bickers, MD  3:29 PM

## 2021-05-16 NOTE — Progress Notes (Signed)
[facility] Progress Note    Assessment/ Plan:   ESRD: has been on HD due to issues with CCPD: inadequate drainage/UF (catheter malfunction?) Outpatient orders: [facility], MWF, F180, 3hrs, 2k, 2.5cal, 137 na, 35bicarb, 400bfr, 16g, edw 86.5kg. hep 2k bolus -discussed with his outpatient home therapies RN 7/5: no plans for further PD and is being scheduled to have PD cath removed as an outpatient, therefore no PD cath flushing needed. -HD MWF, next tomorrow, 1st shift unless on [name]   Cellulitis/diabetic foot, progressive gangrene -off abx per primary service, ortho and vvs consulted. 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 [name]   Volume/ hypertension: EDW 86.5kg pre amputation. needs new edw post amputation-Will challenge EDW again tomorrow   Anemia of Chronic Kidney Disease: Hemoglobin 8.2. Avoiding IV iron for now, receives mircera 150 mcg, last dose 6/24, ordered aranesp here. Transfuse prn   Secondary Hyperparathyroidism/Hyperphosphatemia: continue with home meds, renal diet, monitor phos   DM2 w/ hyperglycemia -mgmt per primary service  Hypoalbuminemia, protein calorie malnutrition -push protein    # Additional recommendations: - Dose all meds for creatinine clearance < 10 ml/min  - Unless absolutely necessary, no MRIs with gadolinium.  - Implement save arm precautions.  Prefer needle sticks in the dorsum of the hands or wrists.  No blood pressure measurements in arm. - If blood transfusion is requested during hemodialysis sessions, please alert [place] prior to the session. - If a hemodialysis catheter line culture is requested, please alert [place] as only hemodialysis nurses are able to collect those specimens.    Jannifer Hick MD [facility] Pager [phone]   Subjective:   Seen and examined bedside. Tolerated HD fine yesterday.  No new issues. Awaiting [name].    Objective:   BP 108/61 (BP Location: Right Arm)   Pulse 91   Temp 98.5 F (36.9 C)  (Oral)   Resp 18   Ht 5\' 11"  (1.803 m)   Wt 90.5 kg   SpO2 94%   BMI 27.83 kg/m   Intake/Output Summary (Last 24 hours) at 05/16/2021 1209 Last data filed at 05/16/2021 1132 Gross per 24 hour  Intake 1143 ml  Output 3800 ml  Net -2657 ml    Weight change: 1.6 kg  Physical Exam: Gen:nad, nontoxic appearing CVS:rrr Resp:normal wob, no acc muscle use, speaking in full sentences Abd:pd cath in place, soft, nt/nd GBE:EFEOF RLE edema, LLE amputated Neuro: awake, alert, speech clear and coherent, moves all ext spontaneously Access: avf +b/t   Imaging: DG CHEST PORT 1 VIEW  Result Date: 05/14/2021 CLINICAL DATA:  65 year old male with cough. EXAM: PORTABLE CHEST 1 VIEW COMPARISON:  Chest radiograph dated 04/10/2021. FINDINGS: Bilateral mid to lower lung field streaky densities, new since the prior radiograph and may represent atypical infiltrate, edema, or atelectasis. Trace bilateral pleural effusions suspected. No pneumothorax. Stable cardiomegaly. Atherosclerotic calcification of the aorta. Median sternotomy wires. No acute osseous pathology. IMPRESSION: Bilateral mid to lower lung field streaky densities may represent atypical infiltrate, edema, or atelectasis. Electronically Signed   By: Anner Crete M.D.   On: 05/14/2021 19:41   ECHOCARDIOGRAM LIMITED  Result Date: 05/15/2021    ECHOCARDIOGRAM LIMITED REPORT   Patient Name:   Maxwell Harmon Date of Exam: 05/15/2021 Medical Rec #:  [id number]       Height:       71.0 in Accession #:    [id number]      Weight:       199.5 lb Date of Birth:  [birth year]  BSA:          2.106 m Patient Age:    65 years        BP:           136/68 mmHg Patient Gender: M               HR:           99 bpm. Exam Location:  Inpatient Procedure: 2D Echo and Intracardiac Opacification Agent Indications:    I50.9* Heart failure (unspecified)  History:        Patient has prior history of Echocardiogram examinations, most                 recent 04/10/2021. Mild  aortic stenosis. S/P foot amputation.  Sonographer:    Merrie Roof RDCS Referring Phys: [phone] AMY D CLEGG IMPRESSIONS  1. Left ventricular ejection fraction, by estimation, is 25 to 30%. Left ventricular ejection fraction by 3D volume is 30 %. The left ventricle has severely decreased function. The left ventricle demonstrates regional wall motion abnormalities (see scoring diagram/findings for description). There is severe hypokinesis of the mid-to-apical inferior, mid-to-apical septal, mid-to-apical anterior, apical lateral LV segments. The apical segments and apex appear akinetic. There is no LV thrombus visualized. The left ventricular internal cavity size was mildly dilated.  2. Right ventricular systolic function is moderately reduced. The right ventricular size is normal.  3. The mitral valve is degenerative. Moderate mitral annular calcification.  4. The inferior vena cava is dilated in size with <50% respiratory variability, suggesting right atrial pressure of 15 mmHg. Comparison(s): Compared to prior TTE in 04/2021, there is no significant change. FINDINGS  Left Ventricle: Left ventricular ejection fraction, by estimation, is 25 to 30%. Left ventricular ejection fraction by 3D volume is 30 %. The left ventricle has severely decreased function. The left ventricle demonstrates regional wall motion abnormalities. There is severe hypokinesis of the mid-to-apical inferior, mid-to-apical septal, mid-to-apical anterior, apical lateral LV segments. The apical segments and apex appear akinetic. There is no LV thrombus visualized. Definity contrast agent was given IV to delineate the left ventricular endocardial borders. The left ventricular internal cavity size was mildly dilated. There is no left ventricular hypertrophy. Right Ventricle: The right ventricular size is normal. Right ventricular systolic function is moderately reduced. Pericardium: There is no evidence of pericardial effusion. Mitral Valve: The  mitral valve is degenerative in appearance. There is mild thickening of the mitral valve leaflet(s). There is mild calcification of the mitral valve leaflet(s). Moderate mitral annular calcification. Tricuspid Valve: The tricuspid valve is normal in structure. Venous: The inferior vena cava is dilated in size with less than 50% respiratory variability, suggesting right atrial pressure of 15 mmHg. LEFT VENTRICLE PLAX 2D LVIDd:         6.00 cm LVIDs:         5.00 cm LV PW:         1.00 cm         3D Volume EF LV IVS:        1.00 cm         LV 3D EF:    Left                                             ventricular  ejection LV Volumes (MOD)                            fraction by LV vol d, MOD    234.0 ml                   3D volume A2C:                                        is 30 %. LV vol d, MOD    211.0 ml A4C: LV vol s, MOD    173.0 ml      3D Volume EF: A2C:                           3D EF:        30 % LV vol s, MOD    132.0 ml      LV EDV:       212 ml A4C:                           LV ESV:       148 ml LV SV MOD A2C:   61.0 ml       LV SV:        63 ml LV SV MOD A4C:   211.0 ml LV SV MOD BP:    67.9 ml IVC IVC diam: 2.20 cm LEFT ATRIUM         Index LA diam:    4.80 cm 2.28 cm/m   AORTA Ao Root diam: 3.30 cm Gwyndolyn Kaufman MD Electronically signed by Gwyndolyn Kaufman MD Signature Date/Time: 05/15/2021/5:44:10 PM    Final     Labs: BMET Recent Labs  Lab 05/12/21 0343 05/13/21 0159 05/14/21 0145 05/15/21 0837  NA 130* 134* 133* 127*  K 3.9 4.0 4.7 4.8  CL 93* 99 97* 94*  CO2 30 27 27 24   GLUCOSE 183* 209* 204* 179*  BUN 54* 33* 50* 75*  CREATININE 5.77* 3.95* 5.13* 6.16*  CALCIUM 8.4* 8.3* 8.5* 8.5*  PHOS  --  3.2  --  3.4    CBC Recent Labs  Lab 05/12/21 0343 05/13/21 0159 05/14/21 0145 05/15/21 0837  WBC 14.3* 9.5 11.2* 10.7*  NEUTROABS 11.9*  --   --   --   HGB 8.3* 8.0* 8.4* 8.2*  HCT 26.2* 25.4* 27.4* 27.3*  MCV 97.0 98.8 100.7*  101.1*  PLT 186 175 178 190     Medications:     aspirin EC  81 mg Oral Daily   atorvastatin  80 mg Oral Daily   calcitRIOL  0.25 mcg Oral BID   Chlorhexidine Gluconate Cloth  6 each Topical Q0600   clopidogrel  75 mg Oral Daily   darbepoetin ([name]) injection - DIALYSIS  150 mcg Intravenous Q Wed-HD   escitalopram  10 mg Oral Daily   ferric citrate  630 mg Oral TID   heparin  5,000 Units Subcutaneous Q8H   insulin aspart  0-6 Units Subcutaneous TID WC   insulin glargine  5 Units Subcutaneous QHS   midodrine  5 mg Oral Q M,W,F-HD   nutrition supplement (JUVEN)  1 packet Oral BID BM   pantoprazole  40 mg Oral Daily   pentoxifylline  400 mg Oral  TID WC   sodium chloride flush  3 mL Intravenous Q12H   sorbitol  30 mL Oral BID   tamsulosin  0.4 mg Oral QHS   zinc sulfate  220 mg Oral Daily

## 2021-05-16 NOTE — Progress Notes (Addendum)
PROGRESS NOTE   Maxwell Harmon  GUY:[id number] DOB: 1956-10-04 DOA: 05/07/2021 PCP: [name], DO  Brief Narrative:  65 year old white male CAD + CABG 2021 Charcot foot ESRD (prior malignancy status post nephrectomy) previously on peritoneal dialysis Chronic L LE diabetic wound previously followed by podiatry >5 months/cellulitis 04/19/2021 had angiogram with revascularization left popliteal, left posterior tibial artery with ICU stay 04/2021--discharged on antibiotics--underwent cath that time with no options for revascularization  Readmit 05/07/2021 worsening pain discharge erythema Lactic acid normal COVID-negative urinalysis negative Left foot x-ray showed no evidence of osteomyelitis  Orthopedic surgeon Dr. Sharol Given consulted Vascular surgeon Dr. 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 Tonia Brooms vac x 1 week-nonweightbearing left extremity Trental 400 3 times daily Opiates transitioned to Dilaudid 1 mg every 6 as needed p.o. [some concern for buildup of metabolites in ESRD--patient slightly more confused today could be the Ativan he received] Continue IV as needed pain meds as required Pain is present - moderately controlled Antibiotics discontinued 7/9  Urinary retention Straight cath 600 cc came out-patient was given periprocedural Ativan but is now a little sleepy slightly confused Watch mentation carefully-minimize IV sedatives if possible Cough CXR 7/11 questionable fluid-EDW being challenged by HD Cough overall improved in addition to SOB Leukocytosis-persistent Secondary to ulcer-AM labs Nausea this a.m.-significant constipation Having stool now ESRD in [place] MWF [previously on PD]-transitioning  to HD EDW challenged 7/11 @ HD--midodrine given during HD 7/11 Superimposed acute blood loss anemia expected from amputation surgery Anemia of renal disease [name] as per nephrology -continue Auryxia 630 3 times daily 1 unit PRBC transfused 7/7 per Ortho--hb acceptable at this time--transfusion threshold below 7 DM TY 2 with nephropathy neuropathy with hypoglycemia earlier in hospital stay Uses continuous monitor at home Lantus increased from 2 units-->5 units 7/10  CBG currently ranging 139-172 Anxiety-situational Continue Lexapro 10, Klonopin 0.5 twice daily prn  DVT prophylaxis: Heparin Code Status: Full Family Communication: Patient and father present landau Xarelto present-I updated the patient's son-in-law Dr. Ninfa Linden today as well Disposition:  Status is: Inpatient  Remains inpatient appropriate because:Hemodynamically unstable and Unsafe d/c plan  Dispo: The patient is from: Home              Anticipated d/c is to: SNF              Patient currently is not medically stable to d/c.   Difficult to place patient No     Consultants:  Vascular surgery Dr. Oneida Alar Orthopedic surgery Dr. Sharol Given  Procedures:   7.8.22-BKA Dr. Sharol Given  Antimicrobials:   Vancomycin, cefepime, Flagyl since admission   Subjective:  A little confused, but had received Ativan at 1114 this morning  Slight asks me if I am the hospital Dr.--He has seen me for the past week and Is able to however have a conversant conversation--states he has occasional pain in his right heel from a Charcot foot No chest pain no fever no nausea Passing stool   Objective: Vitals:   05/15/21 1700 05/15/21 2055 05/16/21 0543 05/16/21 0905  BP: 132/64 105/62 117/74 108/61  Pulse: 95 92 88 91  Resp: 18 18 18 18   Temp: 98.3 F (36.8 C) 97.7 F (36.5 C) 98.6 F (37 C) 98.5 F (36.9 C)  TempSrc:  Oral Oral  Oral  SpO2: 92% 96% 95% 94%  Weight:      Height:        Intake/Output Summary (Last 24 hours) at  05/16/2021 1545 Last data filed at 05/16/2021 1300 Gross per 24 hour  Intake 1140 ml  Output 450 ml  Net 690 ml    Filed Weights   05/15/21 0546 05/15/21 0916 05/15/21 1230  Weight: 91.3 kg 92.9 kg 90.5 kg    Examination:  Slight somnolence no distress Chest clear no rales no rhonchi  C9-O7 holosystolic murmur Abdomen soft no rebound no guarding Right foot no injury slightly decreased dorsiflexion some pain in the heel no swelling Left foot not examined Moving all 4 limbs equally answering relatively appropriately to some questions  Data Reviewed: personally reviewed   CBC    Component Value Date/Time   WBC 10.7 (H) 05/15/2021 0837   RBC 2.70 (L) 05/15/2021 0837   HGB 8.2 (L) 05/15/2021 0837   HCT 27.3 (L) 05/15/2021 0837   PLT 190 05/15/2021 0837   MCV 101.1 (H) 05/15/2021 0837   MCH 30.4 05/15/2021 0837   MCHC 30.0 05/15/2021 0837   RDW 16.5 (H) 05/15/2021 0837   LYMPHSABS 1.3 05/12/2021 0343   MONOABS 0.9 05/12/2021 0343   EOSABS 0.1 05/12/2021 0343   BASOSABS 0.0 05/12/2021 0343   CMP Latest Ref Rng & Units 05/15/2021 05/14/2021 05/13/2021  Glucose 70 - 99 mg/dL 179(H) 204(H) 209(H)  BUN 8 - 23 mg/dL 75(H) 50(H) 33(H)  Creatinine 0.61 - 1.24 mg/dL 6.16(H) 5.13(H) 3.95(H)  Sodium 135 - 145 mmol/L 127(L) 133(L) 134(L)  Potassium 3.5 - 5.1 mmol/L 4.8 4.7 4.0  Chloride 98 - 111 mmol/L 94(L) 97(L) 99  CO2 22 - 32 mmol/L 24 27 27   Calcium 8.9 - 10.3 mg/dL 8.5(L) 8.5(L) 8.3(L)  Total Protein 6.5 - 8.1 g/dL - - -  Total Bilirubin 0.3 - 1.2 mg/dL - - -  Alkaline Phos 38 - 126 U/L - - -  AST 15 - 41 U/L - - -  ALT 0 - 44 U/L - - -     Radiology Studies: DG CHEST PORT 1 VIEW  Result Date: 05/14/2021 CLINICAL DATA:  65 year old male with cough. EXAM: PORTABLE CHEST 1 VIEW COMPARISON:  Chest radiograph dated 04/10/2021. FINDINGS: Bilateral mid to lower lung field streaky densities, new since the prior radiograph and may represent atypical infiltrate, edema, or  atelectasis. Trace bilateral pleural effusions suspected. No pneumothorax. Stable cardiomegaly. Atherosclerotic calcification of the aorta. Median sternotomy wires. No acute osseous pathology. IMPRESSION: Bilateral mid to lower lung field streaky densities may represent atypical infiltrate, edema, or atelectasis. Electronically Signed   By: Anner Crete M.D.   On: 05/14/2021 19:41   ECHOCARDIOGRAM LIMITED  Result Date: 05/15/2021    ECHOCARDIOGRAM LIMITED REPORT   Patient Name:   Maxwell Harmon Date of Exam: 05/15/2021 Medical Rec #:  [id number]       Height:       71.0 in Accession #:    [id number]      Weight:       199.5 lb Date of Birth:  02/21/1956       BSA:          2.106 m Patient Age:    71 years        BP:           136/68 mmHg Patient Gender: M  HR:           99 bpm. Exam Location:  Inpatient Procedure: 2D Echo and Intracardiac Opacification Agent Indications:    I50.9* Heart failure (unspecified)  History:        Patient has prior history of Echocardiogram examinations, most                 recent 04/10/2021. Mild aortic stenosis. S/P foot amputation.  Sonographer:    Merrie Roof RDCS Referring Phys: [phone] AMY D CLEGG IMPRESSIONS  1. Left ventricular ejection fraction, by estimation, is 25 to 30%. Left ventricular ejection fraction by 3D volume is 30 %. The left ventricle has severely decreased function. The left ventricle demonstrates regional wall motion abnormalities (see scoring diagram/findings for description). There is severe hypokinesis of the mid-to-apical inferior, mid-to-apical septal, mid-to-apical anterior, apical lateral LV segments. The apical segments and apex appear akinetic. There is no LV thrombus visualized. The left ventricular internal cavity size was mildly dilated.  2. Right ventricular systolic function is moderately reduced. The right ventricular size is normal.  3. The mitral valve is degenerative. Moderate mitral annular calcification.  4. The inferior vena  cava is dilated in size with <50% respiratory variability, suggesting right atrial pressure of 15 mmHg. Comparison(s): Compared to prior TTE in 04/2021, there is no significant change. FINDINGS  Left Ventricle: Left ventricular ejection fraction, by estimation, is 25 to 30%. Left ventricular ejection fraction by 3D volume is 30 %. The left ventricle has severely decreased function. The left ventricle demonstrates regional wall motion abnormalities. There is severe hypokinesis of the mid-to-apical inferior, mid-to-apical septal, mid-to-apical anterior, apical lateral LV segments. The apical segments and apex appear akinetic. There is no LV thrombus visualized. Definity contrast agent was given IV to delineate the left ventricular endocardial borders. The left ventricular internal cavity size was mildly dilated. There is no left ventricular hypertrophy. Right Ventricle: The right ventricular size is normal. Right ventricular systolic function is moderately reduced. Pericardium: There is no evidence of pericardial effusion. Mitral Valve: The mitral valve is degenerative in appearance. There is mild thickening of the mitral valve leaflet(s). There is mild calcification of the mitral valve leaflet(s). Moderate mitral annular calcification. Tricuspid Valve: The tricuspid valve is normal in structure. Venous: The inferior vena cava is dilated in size with less than 50% respiratory variability, suggesting right atrial pressure of 15 mmHg. LEFT VENTRICLE PLAX 2D LVIDd:         6.00 cm LVIDs:         5.00 cm LV PW:         1.00 cm         3D Volume EF LV IVS:        1.00 cm         LV 3D EF:    Left                                             ventricular                                             ejection LV Volumes (MOD)  fraction by LV vol d, MOD    234.0 ml                   3D volume A2C:                                        is 30 %. LV vol d, MOD    211.0 ml A4C: LV vol s, MOD    173.0 ml       3D Volume EF: A2C:                           3D EF:        30 % LV vol s, MOD    132.0 ml      LV EDV:       212 ml A4C:                           LV ESV:       148 ml LV SV MOD A2C:   61.0 ml       LV SV:        63 ml LV SV MOD A4C:   211.0 ml LV SV MOD BP:    67.9 ml IVC IVC diam: 2.20 cm LEFT ATRIUM         Index LA diam:    4.80 cm 2.28 cm/m   AORTA Ao Root diam: 3.30 cm Gwyndolyn Kaufman MD Electronically signed by Gwyndolyn Kaufman MD Signature Date/Time: 05/15/2021/5:44:10 PM    Final      Scheduled Meds:  aspirin EC  81 mg Oral Daily   atorvastatin  80 mg Oral Daily   calcitRIOL  0.25 mcg Oral BID   Chlorhexidine Gluconate Cloth  6 each Topical Q0600   clopidogrel  75 mg Oral Daily   darbepoetin ([name]) injection - DIALYSIS  150 mcg Intravenous Q Wed-HD   escitalopram  10 mg Oral Daily   ferric citrate  630 mg Oral TID   heparin  5,000 Units Subcutaneous Q8H   insulin aspart  0-6 Units Subcutaneous TID WC   insulin glargine  5 Units Subcutaneous QHS   midodrine  5 mg Oral Q M,W,F-HD   nutrition supplement (JUVEN)  1 packet Oral BID BM   pantoprazole  40 mg Oral Daily   pentoxifylline  400 mg Oral TID WC   sodium chloride flush  3 mL Intravenous Q12H   sorbitol  30 mL Oral BID   tamsulosin  0.4 mg Oral QHS   zinc sulfate  220 mg Oral Daily   Continuous Infusions:     LOS: 8 days   Time spent: 54  Nita Sells, MD [facility] To contact the attending provider between 7A-7P or the covering provider during after hours 7P-7A, please log into the web site [email or web address] and access using universal [place] password for that web site. If you do not have the password, please call the hospital operator.  05/16/2021, 3:45 PM

## 2021-05-17 DIAGNOSIS — L03116 Cellulitis of left lower limb: Secondary | ICD-10-CM | POA: Diagnosis not present

## 2021-05-17 DIAGNOSIS — N186 End stage renal disease: Secondary | ICD-10-CM | POA: Diagnosis not present

## 2021-05-17 DIAGNOSIS — I5023 Acute on chronic systolic (congestive) heart failure: Secondary | ICD-10-CM | POA: Diagnosis not present

## 2021-05-17 DIAGNOSIS — I10 Essential (primary) hypertension: Secondary | ICD-10-CM | POA: Diagnosis not present

## 2021-05-17 LAB — RENAL FUNCTION PANEL
Albumin: 1.8 g/dL — ABNORMAL LOW (ref 3.5–5.0)
Anion gap: 7 (ref 5–15)
BUN: 62 mg/dL — ABNORMAL HIGH (ref 8–23)
CO2: 28 mmol/L (ref 22–32)
Calcium: 9 mg/dL (ref 8.9–10.3)
Chloride: 98 mmol/L (ref 98–111)
Creatinine, Ser: 5.8 mg/dL — ABNORMAL HIGH (ref 0.61–1.24)
GFR, Estimated: 10 mL/min — ABNORMAL LOW (ref >60)
Glucose, Bld: 169 mg/dL — ABNORMAL HIGH (ref 70–99)
Phosphorus: 2.8 mg/dL (ref 2.5–4.6)
Potassium: 4.4 mmol/L (ref 3.5–5.1)
Sodium: 133 mmol/L — ABNORMAL LOW (ref 135–145)

## 2021-05-17 LAB — GLUCOSE, CAPILLARY
Glucose-Capillary: 106 mg/dL — ABNORMAL HIGH (ref 70–99)
Glucose-Capillary: 168 mg/dL — ABNORMAL HIGH (ref 70–99)
Glucose-Capillary: 176 mg/dL — ABNORMAL HIGH (ref 70–99)

## 2021-05-17 LAB — CBC
HCT: 26.6 % — ABNORMAL LOW (ref 39.0–52.0)
Hemoglobin: 8.2 g/dL — ABNORMAL LOW (ref 13.0–17.0)
MCH: 30.9 pg (ref 26.0–34.0)
MCHC: 30.8 g/dL (ref 30.0–36.0)
MCV: 100.4 fL — ABNORMAL HIGH (ref 80.0–100.0)
Platelets: 192 10*3/uL (ref 150–400)
RBC: 2.65 MIL/uL — ABNORMAL LOW (ref 4.22–5.81)
RDW: 17 % — ABNORMAL HIGH (ref 11.5–15.5)
WBC: 10.2 10*3/uL (ref 4.0–10.5)
nRBC: 0 % (ref 0.0–0.2)

## 2021-05-17 MED ORDER — GENTAMICIN SULFATE 0.1 % EX CREA
TOPICAL_CREAM | Freq: Every day | CUTANEOUS | Status: DC
  Filled 2021-05-17: qty 15

## 2021-05-17 MED ORDER — FERRIC CITRATE 1 GM 210 MG(FE) PO TABS
420.0000 mg | ORAL_TABLET | Freq: Three times a day (TID) | ORAL | Status: DC
  Administered 2021-05-17 – 2021-05-18 (×6): 420 mg via ORAL
  Filled 2021-05-17 (×6): qty 2

## 2021-05-17 MED ORDER — SODIUM CHLORIDE 0.9 % IV SOLN
125.0000 mg | INTRAVENOUS | Status: DC
  Administered 2021-05-17: 125 mg via INTRAVENOUS
  Filled 2021-05-17: qty 10

## 2021-05-17 NOTE — Progress Notes (Signed)
Pt completed 4 hours of dialysis, net UF removed was 3404.  Goal adjusted slightly for dropping pressures, aranesp given, as well as Iron, and gentamycin cream applied around PD catheter w/dressing change.  Pt fistula discovered to be hard and swollen, tender to palpation at completion of treatment. Providers notified, CNL informed, and floor RN notidied. ice applied. Pt states it has happened before, understands how ice needs to be applied.  Report called back to unit, pt transferred with stable vs

## 2021-05-17 NOTE — Plan of Care (Signed)
  Problem: Education: Goal: Knowledge of General Education information will improve Description: Including pain rating scale, medication(s)/side effects and non-pharmacologic comfort measures Outcome: Completed/Met   Problem: Health Behavior/Discharge Planning: Goal: Ability to manage health-related needs will improve Outcome: Completed/Met   Problem: Clinical Measurements: Goal: Ability to maintain clinical measurements within normal limits will improve Outcome: Completed/Met Goal: Diagnostic test results will improve Outcome: Completed/Met   

## 2021-05-17 NOTE — Progress Notes (Signed)
PT Cancellation Note  Patient Details Name: Maxwell Harmon MRN: [id number] DOB: 1956-09-21   Cancelled Treatment:    Reason Eval/Treat Not Completed: (P) Patient at procedure or test/unavailable Pt is off floor for HD. PT will follow back this afternoon as able for treatment.  Magen Suriano B. Migdalia Dk PT, DPT Acute Rehabilitation Services Pager [phone] Office [phone]    [place] 05/17/2021, 8:38 AM

## 2021-05-17 NOTE — Progress Notes (Signed)
[facility] Progress Note    Assessment/ Plan:   ESRD: has been on HD due to issues with CCPD: inadequate drainage/UF (catheter malfunction?) Outpatient orders: [place], MWF, F180, 3hrs, 2k, 2.5cal, 137 na, 35bicarb, 400bfr, 16g, edw 86.5kg. hep 2k bolus -discussed with his outpatient home therapies RN 7/5: no plans for further PD and is being scheduled to have PD cath removed as an outpatient, therefore no PD cath flushing needed.  Dressing to be changed daily - RN will facilitate; to be done here during HD MWF and on floor by dialysis RN on off days. Gent cream will be applied too.  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 [name]. [name]    Volume/ hypertension: EDW 86.5kg pre amputation. needs new edw post amputation-Will challenge EDW again today, Will see if we can get a standing wt.    Anemia of Chronic Kidney Disease: Hemoglobin 8.2. No active infection so will give 0.5g IV iron load over 4 HD treatments. Receives mircera 150 mcg, last dose 6/24, ordered aranesp here. Transfuse prn   Secondary Hyperparathyroidism/Hyperphosphatemia: decrease auryxia dose with phos running on low side, renal diet, monitor phos   DM2 w/ hyperglycemia -mgmt per primary service  Hypoalbuminemia, protein calorie malnutrition -push protein    # Additional recommendations: - Dose all meds for creatinine clearance < 10 ml/min  - Unless absolutely necessary, no MRIs with gadolinium.  - Implement save arm precautions.  Prefer needle sticks in the dorsum of the hands or wrists.  No blood pressure measurements in arm. - If blood transfusion is requested during hemodialysis sessions, please alert [place] prior to the session. - If a hemodialysis catheter line culture is requested, please alert [place] as only hemodialysis nurses are able to collect those specimens.    Jannifer Hick MD [facility] Pager  [phone]   Subjective:   Seen and examined on HD. No new issues. Awaiting [name].    Objective:   BP 103/60   Pulse 85   Temp 98.1 F (36.7 C) (Oral)   Resp (!) 22   Ht 5\' 11"  (1.803 m)   Wt 96.4 kg   SpO2 94%   BMI 29.64 kg/m   Intake/Output Summary (Last 24 hours) at 05/17/2021 0835 Last data filed at 05/16/2021 1800 Gross per 24 hour  Intake 480 ml  Output 1050 ml  Net -570 ml    Weight change: 3.5 kg  Physical Exam: Gen:nad, nontoxic appearing CVS:rrr Resp:normal wob, no acc muscle use, speaking in full sentences Abd:pd cath in place, soft, nt/nd TWS:FKCLE RLE edema, LLE amputated with immobilizer on Neuro: awake, alert, speech clear and coherent, moves all ext spontaneously Access: avf +b/t   Imaging: ECHOCARDIOGRAM LIMITED  Result Date: 05/15/2021    ECHOCARDIOGRAM LIMITED REPORT   Patient Name:   Maxwell Harmon Date of Exam: 05/15/2021 Medical Rec #:  [id number]       Height:       71.0 in Accession #:    [id number]      Weight:       199.5 lb Date of Birth:  03-28-1956       BSA:          2.106 m Patient Age:    65 years        BP:           136/68 mmHg Patient Gender: M  HR:           99 bpm. Exam Location:  Inpatient Procedure: 2D Echo and Intracardiac Opacification Agent Indications:    I50.9* Heart failure (unspecified)  History:        Patient has prior history of Echocardiogram examinations, most                 recent 04/10/2021. Mild aortic stenosis. S/P foot amputation.  Sonographer:    Merrie Roof RDCS Referring Phys: [phone] AMY D CLEGG IMPRESSIONS  1. Left ventricular ejection fraction, by estimation, is 25 to 30%. Left ventricular ejection fraction by 3D volume is 30 %. The left ventricle has severely decreased function. The left ventricle demonstrates regional wall motion abnormalities (see scoring diagram/findings for description). There is severe hypokinesis of the mid-to-apical inferior, mid-to-apical septal, mid-to-apical anterior, apical  lateral LV segments. The apical segments and apex appear akinetic. There is no LV thrombus visualized. The left ventricular internal cavity size was mildly dilated.  2. Right ventricular systolic function is moderately reduced. The right ventricular size is normal.  3. The mitral valve is degenerative. Moderate mitral annular calcification.  4. The inferior vena cava is dilated in size with <50% respiratory variability, suggesting right atrial pressure of 15 mmHg. Comparison(s): Compared to prior TTE in 04/2021, there is no significant change. FINDINGS  Left Ventricle: Left ventricular ejection fraction, by estimation, is 25 to 30%. Left ventricular ejection fraction by 3D volume is 30 %. The left ventricle has severely decreased function. The left ventricle demonstrates regional wall motion abnormalities. There is severe hypokinesis of the mid-to-apical inferior, mid-to-apical septal, mid-to-apical anterior, apical lateral LV segments. The apical segments and apex appear akinetic. There is no LV thrombus visualized. Definity contrast agent was given IV to delineate the left ventricular endocardial borders. The left ventricular internal cavity size was mildly dilated. There is no left ventricular hypertrophy. Right Ventricle: The right ventricular size is normal. Right ventricular systolic function is moderately reduced. Pericardium: There is no evidence of pericardial effusion. Mitral Valve: The mitral valve is degenerative in appearance. There is mild thickening of the mitral valve leaflet(s). There is mild calcification of the mitral valve leaflet(s). Moderate mitral annular calcification. Tricuspid Valve: The tricuspid valve is normal in structure. Venous: The inferior vena cava is dilated in size with less than 50% respiratory variability, suggesting right atrial pressure of 15 mmHg. LEFT VENTRICLE PLAX 2D LVIDd:         6.00 cm LVIDs:         5.00 cm LV PW:         1.00 cm         3D Volume EF LV IVS:         1.00 cm         LV 3D EF:    Left                                             ventricular                                             ejection LV Volumes (MOD)  fraction by LV vol d, MOD    234.0 ml                   3D volume A2C:                                        is 30 %. LV vol d, MOD    211.0 ml A4C: LV vol s, MOD    173.0 ml      3D Volume EF: A2C:                           3D EF:        30 % LV vol s, MOD    132.0 ml      LV EDV:       212 ml A4C:                           LV ESV:       148 ml LV SV MOD A2C:   61.0 ml       LV SV:        63 ml LV SV MOD A4C:   211.0 ml LV SV MOD BP:    67.9 ml IVC IVC diam: 2.20 cm LEFT ATRIUM         Index LA diam:    4.80 cm 2.28 cm/m   AORTA Ao Root diam: 3.30 cm Gwyndolyn Kaufman MD Electronically signed by Gwyndolyn Kaufman MD Signature Date/Time: 05/15/2021/5:44:10 PM    Final     Labs: BMET Recent Labs  Lab 05/12/21 6834 05/13/21 0159 05/14/21 0145 05/15/21 0837 05/17/21 0728  NA 130* 134* 133* 127* 133*  K 3.9 4.0 4.7 4.8 4.4  CL 93* 99 97* 94* 98  CO2 30 27 27 24 28   GLUCOSE 183* 209* 204* 179* 169*  BUN 54* 33* 50* 75* 62*  CREATININE 5.77* 3.95* 5.13* 6.16* 5.80*  CALCIUM 8.4* 8.3* 8.5* 8.5* 9.0  PHOS  --  3.2  --  3.4 2.8    CBC Recent Labs  Lab 05/12/21 0343 05/13/21 0159 05/14/21 0145 05/15/21 0837 05/17/21 0309  WBC 14.3* 9.5 11.2* 10.7* 10.2  NEUTROABS 11.9*  --   --   --   --   HGB 8.3* 8.0* 8.4* 8.2* 8.2*  HCT 26.2* 25.4* 27.4* 27.3* 26.6*  MCV 97.0 98.8 100.7* 101.1* 100.4*  PLT 186 175 178 190 192     Medications:     aspirin EC  81 mg Oral Daily   atorvastatin  80 mg Oral Daily   calcitRIOL  0.25 mcg Oral BID   Chlorhexidine Gluconate Cloth  6 each Topical Q0600   clopidogrel  75 mg Oral Daily   darbepoetin ([name]) injection - DIALYSIS  150 mcg Intravenous Q Wed-HD   escitalopram  10 mg Oral Daily   ferric citrate  630 mg Oral TID   heparin  5,000 Units Subcutaneous Q8H    insulin aspart  0-6 Units Subcutaneous TID WC   insulin glargine  5 Units Subcutaneous QHS   midodrine  5 mg Oral Q M,W,F-HD   nutrition supplement (JUVEN)  1 packet Oral BID BM   pantoprazole  40 mg Oral Daily   pentoxifylline  400 mg Oral TID WC   sodium chloride flush  3 mL  Intravenous Q12H   sorbitol  30 mL Oral BID   tamsulosin  0.4 mg Oral QHS   zinc sulfate  220 mg Oral Daily

## 2021-05-17 NOTE — Progress Notes (Addendum)
PROGRESS NOTE   Maxwell Harmon  DDU:[id number] DOB: [birth year] DOA: 05/07/2021 PCP: [name], DO  Brief Narrative:  65 year old white male CAD + CABG 2021 Charcot foot ESRD (prior malignancy status post nephrectomy) previously on peritoneal dialysis Chronic L LE diabetic wound previously followed by podiatry >5 months/cellulitis 04/19/2021 had angiogram with revascularization left popliteal, left posterior tibial artery with ICU stay 04/2021--discharged on antibiotics--underwent cath that time with no options for revascularization  Readmit 05/07/2021 worsening pain discharge erythema Lactic acid normal COVID-negative urinalysis negative Left foot x-ray showed no evidence of osteomyelitis  Orthopedic surgeon Dr. Sharol Given consulted Vascular surgeon Dr. 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 Tonia Brooms vac x 1 week - nonweightbearing left extremity Trental 400 - 3 times daily Continue to wean off narcotics, Dilaudid IV discontinued, Dilaudid p.o. 1 mg every 6 hours ongoing -no benzos since 7/4 Antibiotics discontinued 7/9 after infection control  Urinary retention, acute on chronic Patient required in and out x2 over the past few days, able to make urine last night and again this morning without difficulty If he continues to have obstruction issues will place Foley for outpatient urology follow-up  Constipation, resolving Continue as needed medication, monitor closely for constipation in the setting of narcotics  ESRD in [place] MWF [previously on PD]-transitioning to HD EDW challenged 7/11 @ HD--midodrine given during HD 7/11  Superimposed acute blood loss anemia expected from amputation surgery Anemia of  renal disease Hemoglobin stabilizing, last transfusion 05/11/2021  [name] as per nephrology -continue Auryxia 630 3 times daily  Insulin-dependent diabetes type 2, uncontrolled  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   Difficult to place patient No   Consultants:  Vascular surgery Dr. Oneida Alar Orthopedic surgery Dr. Sharol Given  Procedures:  7.8.22-BKA Dr. Sharol Given  Antimicrobials:   Vancomycin, cefepime, Flagyl since admission   Subjective: No acute issues or events overnight. Tolerating hemodialysis quite well this morning.  Appears to be more awake and alert compared to previous documentation.  Continue to wean CNS depressants as discussed with patient.  Otherwise denies shortness of breath chest pain nausea vomiting headache fevers chills, pain currently well controlled.   Objective: Vitals:   05/17/21 0734 05/17/21 0747 05/17/21 0800 05/17/21 0815  BP: 125/72 114/61 121/66 110/68  Pulse: 92 91 88 87  Resp: (!) 22 18 (!) 22 (!) 25  Temp:      TempSrc:      SpO2: 94% 94%    Weight:      Height:        Intake/Output Summary (Last 24 hours) at 05/17/2021 0826 Last data filed at 05/16/2021 1800 Gross per 24 hour  Intake  480 ml  Output 1050 ml  Net -570 ml    Filed Weights   05/15/21 0916 05/15/21 1230 05/17/21 0500  Weight: 92.9 kg 90.5 kg 96.4 kg    Examination:  General:  Pleasantly resting in  bed, No acute distress. HEENT:  Normocephalic atraumatic.  Sclerae nonicteric, noninjected.  Extraocular movements intact bilaterally. Neck:  Without mass or deformity.  Trachea is midline. Lungs:  Clear to auscultate bilaterally without rhonchi, wheeze, or rales. Heart:  Regular rate and rhythm.  Without murmurs, rubs, or gallops. Abdomen:  Soft, nontender, nondistended.  Without guarding or rebound. Extremities: Without cyanosis, clubbing, edema, or obvious deformity.  Left upper extremity fistula accessed during hemodialysis Skin:  Warm and dry, no erythema   Data Reviewed: personally reviewed   CBC    Component Value Date/Time   WBC 10.2 05/17/2021 0309   RBC 2.65 (L) 05/17/2021 0309   HGB 8.2 (L) 05/17/2021 0309   HCT 26.6 (L) 05/17/2021 0309   PLT 192 05/17/2021 0309   MCV 100.4 (H) 05/17/2021 0309   MCH 30.9 05/17/2021 0309   MCHC 30.8 05/17/2021 0309   RDW 17.0 (H) 05/17/2021 0309   LYMPHSABS 1.3 05/12/2021 0343   MONOABS 0.9 05/12/2021 0343   EOSABS 0.1 05/12/2021 0343   BASOSABS 0.0 05/12/2021 0343   CMP Latest Ref Rng & Units 05/15/2021 05/14/2021 05/13/2021  Glucose 70 - 99 mg/dL 179(H) 204(H) 209(H)  BUN 8 - 23 mg/dL 75(H) 50(H) 33(H)  Creatinine 0.61 - 1.24 mg/dL 6.16(H) 5.13(H) 3.95(H)  Sodium 135 - 145 mmol/L 127(L) 133(L) 134(L)  Potassium 3.5 - 5.1 mmol/L 4.8 4.7 4.0  Chloride 98 - 111 mmol/L 94(L) 97(L) 99  CO2 22 - 32 mmol/L 24 27 27   Calcium 8.9 - 10.3 mg/dL 8.5(L) 8.5(L) 8.3(L)  Total Protein 6.5 - 8.1 g/dL - - -  Total Bilirubin 0.3 - 1.2 mg/dL - - -  Alkaline Phos 38 - 126 U/L - - -  AST 15 - 41 U/L - - -  ALT 0 - 44 U/L - - -     Radiology Studies: ECHOCARDIOGRAM LIMITED  Result Date: 05/15/2021    ECHOCARDIOGRAM LIMITED REPORT   Patient Name:   Maxwell Harmon Date of Exam: 05/15/2021 Medical Rec #:  [id number]       Height:       71.0 in Accession #:    [id number]      Weight:       199.5 lb Date of Birth:  11-22-1955       BSA:          2.106 m  Patient Age:    88 years        BP:           136/68 mmHg Patient Gender: M               HR:           99 bpm. Exam Location:  Inpatient Procedure: 2D Echo and Intracardiac Opacification Agent Indications:    I50.9* Heart failure (unspecified)  History:        Patient has prior history of Echocardiogram examinations, most                 recent 04/10/2021. Mild aortic stenosis. S/P foot amputation.  Sonographer:    Merrie Roof RDCS Referring Phys: [phone] AMY D CLEGG IMPRESSIONS  1. Left ventricular ejection fraction, by estimation, is 25 to 30%. Left ventricular ejection fraction by 3D volume  is 30 %. The left ventricle has severely decreased function. The left ventricle demonstrates regional wall motion abnormalities (see scoring diagram/findings for description). There is severe hypokinesis of the mid-to-apical inferior, mid-to-apical septal, mid-to-apical anterior, apical lateral LV segments. The apical segments and apex appear akinetic. There is no LV thrombus visualized. The left ventricular internal cavity size was mildly dilated.  2. Right ventricular systolic function is moderately reduced. The right ventricular size is normal.  3. The mitral valve is degenerative. Moderate mitral annular calcification.  4. The inferior vena cava is dilated in size with <50% respiratory variability, suggesting right atrial pressure of 15 mmHg. Comparison(s): Compared to prior TTE in 04/2021, there is no significant change. FINDINGS  Left Ventricle: Left ventricular ejection fraction, by estimation, is 25 to 30%. Left ventricular ejection fraction by 3D volume is 30 %. The left ventricle has severely decreased function. The left ventricle demonstrates regional wall motion abnormalities. There is severe hypokinesis of the mid-to-apical inferior, mid-to-apical septal, mid-to-apical anterior, apical lateral LV segments. The apical segments and apex appear akinetic. There is no LV thrombus visualized. Definity contrast agent was  given IV to delineate the left ventricular endocardial borders. The left ventricular internal cavity size was mildly dilated. There is no left ventricular hypertrophy. Right Ventricle: The right ventricular size is normal. Right ventricular systolic function is moderately reduced. Pericardium: There is no evidence of pericardial effusion. Mitral Valve: The mitral valve is degenerative in appearance. There is mild thickening of the mitral valve leaflet(s). There is mild calcification of the mitral valve leaflet(s). Moderate mitral annular calcification. Tricuspid Valve: The tricuspid valve is normal in structure. Venous: The inferior vena cava is dilated in size with less than 50% respiratory variability, suggesting right atrial pressure of 15 mmHg. LEFT VENTRICLE PLAX 2D LVIDd:         6.00 cm LVIDs:         5.00 cm LV PW:         1.00 cm         3D Volume EF LV IVS:        1.00 cm         LV 3D EF:    Left                                             ventricular                                             ejection LV Volumes (MOD)                            fraction by LV vol d, MOD    234.0 ml                   3D volume A2C:                                        is 30 %. LV vol d, MOD    211.0 ml A4C: LV vol s, MOD    173.0 ml      3D Volume EF: A2C:  3D EF:        30 % LV vol s, MOD    132.0 ml      LV EDV:       212 ml A4C:                           LV ESV:       148 ml LV SV MOD A2C:   61.0 ml       LV SV:        63 ml LV SV MOD A4C:   211.0 ml LV SV MOD BP:    67.9 ml IVC IVC diam: 2.20 cm LEFT ATRIUM         Index LA diam:    4.80 cm 2.28 cm/m   AORTA Ao Root diam: 3.30 cm Gwyndolyn Kaufman MD Electronically signed by Gwyndolyn Kaufman MD Signature Date/Time: 05/15/2021/5:44:10 PM    Final      Scheduled Meds:  aspirin EC  81 mg Oral Daily   atorvastatin  80 mg Oral Daily   calcitRIOL  0.25 mcg Oral BID   Chlorhexidine Gluconate Cloth  6 each Topical Q0600   clopidogrel  75  mg Oral Daily   darbepoetin ([name]) injection - DIALYSIS  150 mcg Intravenous Q Wed-HD   escitalopram  10 mg Oral Daily   ferric citrate  630 mg Oral TID   heparin  5,000 Units Subcutaneous Q8H   insulin aspart  0-6 Units Subcutaneous TID WC   insulin glargine  5 Units Subcutaneous QHS   midodrine  5 mg Oral Q M,W,F-HD   nutrition supplement (JUVEN)  1 packet Oral BID BM   pantoprazole  40 mg Oral Daily   pentoxifylline  400 mg Oral TID WC   sodium chloride flush  3 mL Intravenous Q12H   sorbitol  30 mL Oral BID   tamsulosin  0.4 mg Oral QHS   zinc sulfate  220 mg Oral Daily   Continuous Infusions:     LOS: 9 days   Time spent: [place], DO [facility] Pager: Secure chat   05/17/2021, 8:26 AM

## 2021-05-17 NOTE — Progress Notes (Signed)
Order placed for daily dressing changes to PD catheter per Dr. Johnney Ou. Please also apply gent cream with dressing change.

## 2021-05-17 NOTE — Progress Notes (Signed)
Inpatient Rehab Admissions Coordinator:    I called Christella Scheuermann today to request an update on prior auth for [name]. Case manager was unavailable but customer service rep stated she could not find his case even though one had been opened on 7/11. I resent clinicals and sent another request for prior auth. I updated Pt.'s wife, Janace Hoard.   Clemens Catholic, [place], [place] Admissions Coordinator  [phone] ([place]) [phone] (office)

## 2021-05-17 NOTE — Progress Notes (Signed)
Physical Therapy Treatment Patient Details Name: Maxwell Harmon MRN: [id number] DOB: 04-24-1956 Today's Date: 05/17/2021    History of Present Illness Pt is a 65 y.o. male admitted 7/3 with diagnosis of cellulitis LLE. Pt presented with gangrenous ischemic changes of the left heel ulcer. 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 PMH: CHF, ESRD (switched from PD to HB 04/2021), hypertension, CAD s/p CABG 2021, PAD h/o limb ischemia, Charcot foot, hyperlipidemia, diabetes, TIA    PT Comments    Pt reports increased fatigue today after HD, however motivated to get OOB to chair. Pt also reports fear with getting back to bed from chair after sitting up. Found drop arm recliner and discussed not having to come to standing will make it easier to return to bed. Pt limited today due to fatigue and resultant weakness. Donned limb protector on L LE and shoe on R foot for improved safety with mobility. Pt min A for bed mobility and modA for squat pivot transfer to recliner. Once in recliner worked on sit>stand with mod-minA and hopping forward and back. Pt motivated to accomplish, however unable to sequence tricep push up and while upright lifting R foot and swinging forward. After repeated tries for hopping pt fatigued and returned to chair where he worked on chair push ups. Pt is very motivated to regain his independence with mobility and has necessary home support for success making him a perfect candidate for [name] level rehab prior to return home. PT will continue to follow acutely. .   Follow Up Recommendations  [name]     Equipment Recommendations  Wheelchair (measurements PT)    Recommendations for Other Services Rehab consult     Precautions / Restrictions Precautions Precautions: Fall Precaution Comments: charcot foot Required Braces or Orthoses: Other Brace Other Brace: Limb Guard Restrictions Weight Bearing Restrictions: No LLE Weight Bearing: Non weight bearing Other Position/Activity Restrictions: limb  protector for mobility    Mobility  Bed Mobility Overal bed mobility: Needs Assistance Bed Mobility: Sit to Supine     Supine to sit: HOB elevated;Min assist     General bed mobility comments: vc for sequencing, able to mobilize LE off bed could not reach opposite arm across to bed rail requiring minA for pt to pull against therapist to bring hips around and then pt able to bring trunk to upright with increased effort but no physical assist    Transfers Overall transfer level: Needs assistance Equipment used: Rolling walker (2 wheeled) Transfers: Sit to/from [facility] Sit to Stand: Min assist;Mod assist   Squat pivot transfers: Mod assist     General transfer comment: modA for multiple scoots for squat pivot to drop arm recliner, 2x sit>stand 1 modA and 1 min A to come to upright in RW  Ambulation/Gait Ambulation/Gait assistance: Min assist;Mod assist Gait Distance (Feet): 1 Feet Assistive device: Rolling walker (2 wheeled) Gait Pattern/deviations:  (2 hops forward and attempt to hop back unable, attempted again however not able to clear foot)     General Gait Details: unable         Balance Overall balance assessment: Needs assistance Sitting-balance support: Feet supported;No upper extremity supported Sitting balance-Leahy Scale: Good     Standing balance support: During functional activity;Bilateral upper extremity supported Standing balance-Leahy Scale: Poor Standing balance comment: heavy reliance on BUE and external support                            Cognition Arousal/Alertness:  Awake/alert Behavior During Therapy: Flat affect (tearful and frustrated) Overall Cognitive Status: Within Functional Limits for tasks assessed                                        Exercises Other Exercises Other Exercises: chair push ups x10    General Comments General comments (skin integrity, edema, etc.): Pt with less R LE  slippage with shoe donned, pt tearful during session reports he is not sure he can do "this" meaning the amputation and the dialysis      Pertinent Vitals/Pain Pain Assessment: Faces Faces Pain Scale: Hurts little more Pain Location: LLE Pain Descriptors / Indicators: Sore;Operative site guarding Pain Intervention(s): Limited activity within patient's tolerance;Monitored during session;Repositioned     PT Goals (current goals can now be found in the care plan section) Acute Rehab PT Goals Patient Stated Goal: I need to get stronger so I can do more for myself PT Goal Formulation: With patient/family Time For Goal Achievement: 05/27/21 Potential to Achieve Goals: Good Progress towards PT goals: Progressing toward goals    Frequency    Min 3X/week      PT Plan Current plan remains appropriate       AM-PAC PT "6 Clicks" Mobility   Outcome Measure  Help needed turning from your back to your side while in a flat bed without using bedrails?: A Little Help needed moving from lying on your back to sitting on the side of a flat bed without using bedrails?: A Lot Help needed moving to and from a bed to a chair (including a wheelchair)?: A Lot Help needed standing up from a chair using your arms (e.g., wheelchair or bedside chair)?: A Little Help needed to walk in hospital room?: A Lot Help needed climbing 3-5 steps with a railing? : Total 6 Click Score: 13    End of Session Equipment Utilized During Treatment: Gait belt Activity Tolerance: Patient tolerated treatment well Patient left: in chair;with call bell/phone within reach;with chair alarm set;Other (comment) (limb protector off for comfort) Nurse Communication: Mobility status PT Visit Diagnosis: Other abnormalities of gait and mobility (R26.89);Pain;Muscle weakness (generalized) (M62.81) Pain - Right/Left: Left Pain - part of body: Leg     Time: 0383-3383 PT Time Calculation (min) (ACUTE ONLY): 22 min  Charges:   $Therapeutic Activity: 8-22 mins                     [place] Talerico B. Migdalia Dk PT, DPT Acute Rehabilitation Services Pager [phone] Office [phone]    [place] 05/17/2021, 4:27 PM

## 2021-05-18 ENCOUNTER — Encounter (HOSPITAL_COMMUNITY): Payer: Self-pay | Admitting: Internal Medicine

## 2021-05-18 ENCOUNTER — Inpatient Hospital Stay (HOSPITAL_COMMUNITY)
Admission: RE | Admit: 2021-05-18 | Discharge: 2021-06-08 | DRG: 559 | Disposition: A | Discharge status: Home Health | Payer: Managed Care, Other (non HMO) | Source: Intra-hospital | Attending: Physical Medicine and Rehabilitation | Admitting: Physical Medicine and Rehabilitation

## 2021-05-18 ENCOUNTER — Other Ambulatory Visit: Payer: Self-pay

## 2021-05-18 ENCOUNTER — Encounter (HOSPITAL_COMMUNITY): Payer: Self-pay | Admitting: Physical Medicine and Rehabilitation

## 2021-05-18 DIAGNOSIS — E46 Unspecified protein-calorie malnutrition: Secondary | ICD-10-CM | POA: Diagnosis present

## 2021-05-18 DIAGNOSIS — N186 End stage renal disease: Secondary | ICD-10-CM

## 2021-05-18 DIAGNOSIS — S88112A Complete traumatic amputation at level between knee and ankle, left lower leg, initial encounter: Secondary | ICD-10-CM | POA: Diagnosis not present

## 2021-05-18 DIAGNOSIS — L89302 Pressure ulcer of unspecified buttock, stage 2: Secondary | ICD-10-CM | POA: Diagnosis not present

## 2021-05-18 DIAGNOSIS — G2581 Restless legs syndrome: Secondary | ICD-10-CM | POA: Diagnosis present

## 2021-05-18 DIAGNOSIS — I252 Old myocardial infarction: Secondary | ICD-10-CM

## 2021-05-18 DIAGNOSIS — N2581 Secondary hyperparathyroidism of renal origin: Secondary | ICD-10-CM | POA: Diagnosis present

## 2021-05-18 DIAGNOSIS — R339 Retention of urine, unspecified: Secondary | ICD-10-CM | POA: Diagnosis not present

## 2021-05-18 DIAGNOSIS — Z8673 Personal history of transient ischemic attack (TIA), and cerebral infarction without residual deficits: Secondary | ICD-10-CM

## 2021-05-18 DIAGNOSIS — F32A Depression, unspecified: Secondary | ICD-10-CM | POA: Diagnosis present

## 2021-05-18 DIAGNOSIS — Z9862 Peripheral vascular angioplasty status: Secondary | ICD-10-CM

## 2021-05-18 DIAGNOSIS — R058 Other specified cough: Secondary | ICD-10-CM

## 2021-05-18 DIAGNOSIS — E8809 Other disorders of plasma-protein metabolism, not elsewhere classified: Secondary | ICD-10-CM | POA: Diagnosis not present

## 2021-05-18 DIAGNOSIS — Z794 Long term (current) use of insulin: Secondary | ICD-10-CM

## 2021-05-18 DIAGNOSIS — Z833 Family history of diabetes mellitus: Secondary | ICD-10-CM

## 2021-05-18 DIAGNOSIS — I5023 Acute on chronic systolic (congestive) heart failure: Secondary | ICD-10-CM | POA: Diagnosis present

## 2021-05-18 DIAGNOSIS — Z809 Family history of malignant neoplasm, unspecified: Secondary | ICD-10-CM

## 2021-05-18 DIAGNOSIS — K5903 Drug induced constipation: Secondary | ICD-10-CM

## 2021-05-18 DIAGNOSIS — Z89512 Acquired absence of left leg below knee: Secondary | ICD-10-CM | POA: Diagnosis not present

## 2021-05-18 DIAGNOSIS — Z4781 Encounter for orthopedic aftercare following surgical amputation: Principal | ICD-10-CM

## 2021-05-18 DIAGNOSIS — L8992 Pressure ulcer of unspecified site, stage 2: Secondary | ICD-10-CM | POA: Clinically undetermined

## 2021-05-18 DIAGNOSIS — I10 Essential (primary) hypertension: Secondary | ICD-10-CM | POA: Diagnosis not present

## 2021-05-18 DIAGNOSIS — I132 Hypertensive heart and chronic kidney disease with heart failure and with stage 5 chronic kidney disease, or end stage renal disease: Secondary | ICD-10-CM | POA: Diagnosis present

## 2021-05-18 DIAGNOSIS — D631 Anemia in chronic kidney disease: Secondary | ICD-10-CM | POA: Diagnosis present

## 2021-05-18 DIAGNOSIS — L89612 Pressure ulcer of right heel, stage 2: Secondary | ICD-10-CM | POA: Diagnosis present

## 2021-05-18 DIAGNOSIS — Z823 Family history of stroke: Secondary | ICD-10-CM

## 2021-05-18 DIAGNOSIS — E11610 Type 2 diabetes mellitus with diabetic neuropathic arthropathy: Secondary | ICD-10-CM | POA: Diagnosis present

## 2021-05-18 DIAGNOSIS — G546 Phantom limb syndrome with pain: Secondary | ICD-10-CM | POA: Diagnosis present

## 2021-05-18 DIAGNOSIS — G4700 Insomnia, unspecified: Secondary | ICD-10-CM

## 2021-05-18 DIAGNOSIS — Z6825 Body mass index (BMI) 25.0-25.9, adult: Secondary | ICD-10-CM

## 2021-05-18 DIAGNOSIS — Z992 Dependence on renal dialysis: Secondary | ICD-10-CM | POA: Diagnosis not present

## 2021-05-18 DIAGNOSIS — R34 Anuria and oliguria: Secondary | ICD-10-CM | POA: Diagnosis not present

## 2021-05-18 DIAGNOSIS — Z7982 Long term (current) use of aspirin: Secondary | ICD-10-CM

## 2021-05-18 DIAGNOSIS — E1165 Type 2 diabetes mellitus with hyperglycemia: Secondary | ICD-10-CM | POA: Diagnosis present

## 2021-05-18 DIAGNOSIS — Z8249 Family history of ischemic heart disease and other diseases of the circulatory system: Secondary | ICD-10-CM

## 2021-05-18 DIAGNOSIS — F419 Anxiety disorder, unspecified: Secondary | ICD-10-CM | POA: Diagnosis present

## 2021-05-18 DIAGNOSIS — I2510 Atherosclerotic heart disease of native coronary artery without angina pectoris: Secondary | ICD-10-CM | POA: Diagnosis present

## 2021-05-18 DIAGNOSIS — Z79899 Other long term (current) drug therapy: Secondary | ICD-10-CM

## 2021-05-18 DIAGNOSIS — K5909 Other constipation: Secondary | ICD-10-CM | POA: Diagnosis present

## 2021-05-18 DIAGNOSIS — T402X5A Adverse effect of other opioids, initial encounter: Secondary | ICD-10-CM

## 2021-05-18 DIAGNOSIS — E1122 Type 2 diabetes mellitus with diabetic chronic kidney disease: Secondary | ICD-10-CM

## 2021-05-18 DIAGNOSIS — I739 Peripheral vascular disease, unspecified: Secondary | ICD-10-CM | POA: Diagnosis not present

## 2021-05-18 DIAGNOSIS — Z7902 Long term (current) use of antithrombotics/antiplatelets: Secondary | ICD-10-CM

## 2021-05-18 DIAGNOSIS — L03116 Cellulitis of left lower limb: Secondary | ICD-10-CM | POA: Diagnosis not present

## 2021-05-18 LAB — GLUCOSE, CAPILLARY
Glucose-Capillary: 127 mg/dL — ABNORMAL HIGH (ref 70–99)
Glucose-Capillary: 189 mg/dL — ABNORMAL HIGH (ref 70–99)
Glucose-Capillary: 160 mg/dL — ABNORMAL HIGH (ref 70–99)
Glucose-Capillary: 168 mg/dL — ABNORMAL HIGH (ref 70–99)
Glucose-Capillary: 207 mg/dL — ABNORMAL HIGH (ref 70–99)

## 2021-05-18 MED ORDER — ACETAMINOPHEN 325 MG PO TABS
325.0000 mg | ORAL_TABLET | ORAL | Status: DC | PRN
  Administered 2021-05-22 – 2021-06-08 (×20): 650 mg via ORAL
  Filled 2021-05-18 (×22): qty 2

## 2021-05-18 MED ORDER — CLOPIDOGREL BISULFATE 75 MG PO TABS
75.0000 mg | ORAL_TABLET | Freq: Every day | ORAL | Status: DC
  Administered 2021-05-19 – 2021-06-08 (×21): 75 mg via ORAL
  Filled 2021-05-18 (×21): qty 1

## 2021-05-18 MED ORDER — ATORVASTATIN CALCIUM 80 MG PO TABS
80.0000 mg | ORAL_TABLET | Freq: Every day | ORAL | Status: DC
  Administered 2021-05-19 – 2021-06-08 (×21): 80 mg via ORAL
  Filled 2021-05-18 (×21): qty 1

## 2021-05-18 MED ORDER — PROCHLORPERAZINE MALEATE 5 MG PO TABS: 5.0000 mg | ORAL_TABLET | Freq: Four times a day (QID) | ORAL | Status: DC | PRN

## 2021-05-18 MED ORDER — FERRIC CITRATE 1 GM 210 MG(FE) PO TABS: 420.0000 mg | ORAL_TABLET | Freq: Three times a day (TID) | ORAL | 1 refills | Status: DC

## 2021-05-18 MED ORDER — TRAZODONE HCL 50 MG PO TABS: 25.0000 mg | ORAL_TABLET | Freq: Every evening | ORAL | Status: DC | PRN

## 2021-05-18 MED ORDER — GABAPENTIN 300 MG PO CAPS
300.0000 mg | ORAL_CAPSULE | Freq: Every day | ORAL | Status: DC
  Administered 2021-05-19 – 2021-05-20 (×3): 300 mg via ORAL
  Filled 2021-05-18 (×3): qty 1

## 2021-05-18 MED ORDER — GENTAMICIN SULFATE 0.1 % EX CREA
TOPICAL_CREAM | Freq: Every day | CUTANEOUS | Status: DC
  Filled 2021-05-18: qty 15

## 2021-05-18 MED ORDER — CALCITRIOL 0.25 MCG PO CAPS
0.2500 ug | ORAL_CAPSULE | Freq: Two times a day (BID) | ORAL | Status: DC
  Administered 2021-05-19 – 2021-06-08 (×41): 0.25 ug via ORAL
  Filled 2021-05-18 (×41): qty 1

## 2021-05-18 MED ORDER — JUVEN PO PACK: 1.0000 | PACK | Freq: Two times a day (BID) | ORAL | 0 refills | Status: DC

## 2021-05-18 MED ORDER — CLONAZEPAM 0.5 MG PO TABS
0.5000 mg | ORAL_TABLET | Freq: Two times a day (BID) | ORAL | Status: DC | PRN
  Administered 2021-05-18 – 2021-05-19 (×2): 0.5 mg via ORAL
  Filled 2021-05-18 (×2): qty 1

## 2021-05-18 MED ORDER — PANTOPRAZOLE SODIUM 40 MG PO TBEC: 40.0000 mg | DELAYED_RELEASE_TABLET | Freq: Every day | ORAL | 0 refills | Status: DC

## 2021-05-18 MED ORDER — MIDODRINE HCL 5 MG PO TABS
5.0000 mg | ORAL_TABLET | ORAL | Status: DC
  Administered 2021-05-19: 5 mg via ORAL
  Filled 2021-05-18: qty 1

## 2021-05-18 MED ORDER — ESCITALOPRAM OXALATE 10 MG PO TABS
10.0000 mg | ORAL_TABLET | Freq: Every day | ORAL | Status: DC
  Administered 2021-05-20: 10 mg via ORAL
  Filled 2021-05-18 (×2): qty 1

## 2021-05-18 MED ORDER — PROCHLORPERAZINE 25 MG RE SUPP: 12.5000 mg | Freq: Four times a day (QID) | RECTAL | Status: DC | PRN

## 2021-05-18 MED ORDER — ALUMINUM HYDROXIDE GEL 320 MG/5ML PO SUSP
10.0000 mL | Freq: Four times a day (QID) | ORAL | Status: DC | PRN
  Filled 2021-05-18: qty 30

## 2021-05-18 MED ORDER — TAMSULOSIN HCL 0.4 MG PO CAPS
0.4000 mg | ORAL_CAPSULE | Freq: Every day | ORAL | Status: DC
  Administered 2021-05-19 – 2021-05-23 (×5): 0.4 mg via ORAL
  Filled 2021-05-18 (×5): qty 1

## 2021-05-18 MED ORDER — HEPARIN SODIUM (PORCINE) 5000 UNIT/ML IJ SOLN
5000.0000 [IU] | Freq: Three times a day (TID) | INTRAMUSCULAR | Status: DC
  Administered 2021-05-19 – 2021-06-08 (×54): 5000 [IU] via SUBCUTANEOUS
  Filled 2021-05-18 (×53): qty 1

## 2021-05-18 MED ORDER — POLYETHYLENE GLYCOL 3350 17 G PO PACK
17.0000 g | PACK | Freq: Every day | ORAL | Status: DC | PRN
  Administered 2021-05-22: 17 g via ORAL
  Filled 2021-05-18: qty 1

## 2021-05-18 MED ORDER — METHOCARBAMOL 500 MG PO TABS
500.0000 mg | ORAL_TABLET | Freq: Three times a day (TID) | ORAL | Status: DC | PRN
  Administered 2021-05-30: 500 mg via ORAL
  Filled 2021-05-18: qty 1

## 2021-05-18 MED ORDER — GUAIFENESIN-DM 100-10 MG/5ML PO SYRP
5.0000 mL | ORAL_SOLUTION | Freq: Four times a day (QID) | ORAL | Status: DC | PRN
  Administered 2021-05-20: 10 mL via ORAL
  Filled 2021-05-18: qty 10

## 2021-05-18 MED ORDER — SODIUM CHLORIDE 0.9 % IV SOLN
125.0000 mg | INTRAVENOUS | Status: AC
  Administered 2021-05-19 – 2021-05-22 (×2): 125 mg via INTRAVENOUS
  Filled 2021-05-18 (×3): qty 10

## 2021-05-18 MED ORDER — ZINC SULFATE 220 (50 ZN) MG PO CAPS: 220.0000 mg | ORAL_CAPSULE | Freq: Every day | ORAL | 0 refills | Status: DC

## 2021-05-18 MED ORDER — INSULIN GLARGINE 100 UNIT/ML ~~LOC~~ SOLN
5.0000 [IU] | Freq: Every day | SUBCUTANEOUS | Status: DC
  Administered 2021-05-19 – 2021-05-21 (×3): 5 [IU] via SUBCUTANEOUS
  Filled 2021-05-18 (×4): qty 0.05

## 2021-05-18 MED ORDER — DARBEPOETIN ALFA 150 MCG/0.3ML IJ SOSY
150.0000 ug | PREFILLED_SYRINGE | INTRAMUSCULAR | Status: DC
  Filled 2021-05-18 (×2): qty 0.3

## 2021-05-18 MED ORDER — GENTAMICIN SULFATE 0.1 % EX CREA
TOPICAL_CREAM | Freq: Every day | CUTANEOUS | 0 refills | Status: DC
Start: 2021-05-18 — End: 2021-08-03

## 2021-05-18 MED ORDER — MIDODRINE HCL 5 MG PO TABS: 5.0000 mg | ORAL_TABLET | ORAL | 0 refills | Status: DC

## 2021-05-18 MED ORDER — FERRIC CITRATE 1 GM 210 MG(FE) PO TABS
420.0000 mg | ORAL_TABLET | Freq: Three times a day (TID) | ORAL | Status: DC
  Administered 2021-05-19 – 2021-05-25 (×18): 420 mg via ORAL
  Filled 2021-05-18 (×20): qty 2

## 2021-05-18 MED ORDER — SORBITOL 70 % SOLN
30.0000 mL | Freq: Two times a day (BID) | Status: DC
  Administered 2021-05-19 – 2021-06-01 (×26): 30 mL via ORAL
  Filled 2021-05-18 (×27): qty 30

## 2021-05-18 MED ORDER — OXYCODONE HCL 5 MG PO TABS
5.0000 mg | ORAL_TABLET | ORAL | Status: DC | PRN
  Administered 2021-05-19: 10 mg via ORAL
  Filled 2021-05-18: qty 2

## 2021-05-18 MED ORDER — PENTOXIFYLLINE ER 400 MG PO TBCR
400.0000 mg | EXTENDED_RELEASE_TABLET | Freq: Three times a day (TID) | ORAL | Status: DC
Start: 2021-05-19 — End: 2021-05-24
  Administered 2021-05-19 – 2021-05-24 (×16): 400 mg via ORAL
  Filled 2021-05-18 (×18): qty 1

## 2021-05-18 MED ORDER — JUVEN PO PACK
1.0000 | PACK | Freq: Two times a day (BID) | ORAL | Status: DC
  Administered 2021-05-19 – 2021-06-08 (×28): 1 via ORAL
  Filled 2021-05-18 (×33): qty 1

## 2021-05-18 MED ORDER — SORBITOL 70 % SOLN: 30.0000 mL | Freq: Two times a day (BID) | 0 refills | Status: DC

## 2021-05-18 MED ORDER — ALBUTEROL SULFATE (2.5 MG/3ML) 0.083% IN NEBU: 3.0000 mL | INHALATION_SOLUTION | RESPIRATORY_TRACT | Status: DC | PRN

## 2021-05-18 MED ORDER — BISACODYL 10 MG RE SUPP: 10.0000 mg | Freq: Every day | RECTAL | Status: DC | PRN

## 2021-05-18 MED ORDER — INSULIN ASPART 100 UNIT/ML IJ SOLN
0.0000 [IU] | Freq: Three times a day (TID) | INTRAMUSCULAR | Status: DC
  Administered 2021-05-19 – 2021-05-20 (×3): 1 [IU] via SUBCUTANEOUS
  Administered 2021-05-21: 2 [IU] via SUBCUTANEOUS
  Administered 2021-05-21 – 2021-05-27 (×10): 1 [IU] via SUBCUTANEOUS
  Administered 2021-05-28: 2 [IU] via SUBCUTANEOUS
  Administered 2021-05-29: 0 [IU] via SUBCUTANEOUS
  Administered 2021-06-03 – 2021-06-06 (×3): 1 [IU] via SUBCUTANEOUS

## 2021-05-18 MED ORDER — MILK AND MOLASSES ENEMA
1.0000 | Freq: Every day | RECTAL | Status: DC | PRN
  Filled 2021-05-18: qty 240

## 2021-05-18 MED ORDER — HYDROMORPHONE HCL 2 MG PO TABS: 1.0000 mg | ORAL_TABLET | Freq: Four times a day (QID) | ORAL | 0 refills | Status: DC | PRN

## 2021-05-18 MED ORDER — PANTOPRAZOLE SODIUM 40 MG PO TBEC
40.0000 mg | DELAYED_RELEASE_TABLET | Freq: Every day | ORAL | Status: DC
  Administered 2021-05-19 – 2021-06-08 (×21): 40 mg via ORAL
  Filled 2021-05-18 (×21): qty 1

## 2021-05-18 MED ORDER — PROCHLORPERAZINE EDISYLATE 10 MG/2ML IJ SOLN: 5.0000 mg | Freq: Four times a day (QID) | INTRAMUSCULAR | Status: DC | PRN

## 2021-05-18 MED ORDER — ASPIRIN EC 81 MG PO TBEC
81.0000 mg | DELAYED_RELEASE_TABLET | Freq: Every day | ORAL | Status: DC
  Administered 2021-05-19 – 2021-06-08 (×21): 81 mg via ORAL
  Filled 2021-05-18 (×21): qty 1

## 2021-05-18 MED ORDER — PHENOL 1.4 % MT LIQD
1.0000 | OROMUCOSAL | Status: DC | PRN
Start: 2021-05-18 — End: 2021-06-08

## 2021-05-18 MED ORDER — TRAMADOL HCL 50 MG PO TABS
50.0000 mg | ORAL_TABLET | Freq: Four times a day (QID) | ORAL | Status: DC | PRN
  Administered 2021-06-07: 50 mg via ORAL
  Filled 2021-05-18 (×4): qty 1

## 2021-05-18 MED ORDER — DIPHENHYDRAMINE HCL 12.5 MG/5ML PO ELIX
12.5000 mg | ORAL_SOLUTION | Freq: Four times a day (QID) | ORAL | Status: DC | PRN
  Administered 2021-05-23 – 2021-05-27 (×3): 25 mg via ORAL
  Administered 2021-05-31: 12.5 mg via ORAL
  Filled 2021-05-18 (×6): qty 10

## 2021-05-18 MED ORDER — POLYETHYLENE GLYCOL 3350 17 G PO PACK
17.0000 g | PACK | Freq: Every day | ORAL | 0 refills | Status: DC | PRN
Start: 2021-05-18 — End: 2021-06-07

## 2021-05-18 MED ORDER — ZINC SULFATE 220 (50 ZN) MG PO CAPS
220.0000 mg | ORAL_CAPSULE | Freq: Every day | ORAL | Status: AC
  Administered 2021-05-19 – 2021-05-25 (×7): 220 mg via ORAL
  Filled 2021-05-18 (×7): qty 1

## 2021-05-18 NOTE — Progress Notes (Addendum)
IP rehab admissions - I have approval from [facility] for [name] admission for today.  If patient is medically ready, can admit to [name].  We do have a bed available today.  Call me for questions.  [phone]  Patient has been cleared by attending MD to discharge to [name].  I met with patient and his daughter at the bedside and they are in agreement to [name] today.  Will admit to [name] today.  [phone]

## 2021-05-18 NOTE — Progress Notes (Signed)
Courtney Heys, MD   Physician  Nursing  PMR Pre-admission     Signed  Date of Service:  05/14/2021  9:21 AM       Related encounter: ED to Hosp-Admission (Current) from 05/07/2021 in [place]                                                                                                                                                                                                                                                                                                                                                                                                                                                                                                                     PMR Admission Coordinator Pre-Admission Assessment   Patient: Maxwell Harmon is an 65 y.o., male MRN: [id number] DOB: 23-Sep-1956 Height: '5\' 11"'  (180.3 cm) Weight: 89.9 kg   Insurance Information HMO:    Yes    PPO:      PCP:  IPA:      80/20:      OTHER: PRIMARY: [employer] Managed          Policy#: [id number]      Subscriber: Pt Pre Cert # [id number] from 05/18/21 to 05/24/21 with update due 05/25/21 CM Name: [name]      Phone#: Neita Goodnight [phone] ext [phone])     Fax#:   Eff Date: 10/03/2015 - 11/04/2021 Deductible: does not have one OOP Max: does not have one [name]: 80% coverage, 20% co-insurance SNF: 80% coverage, 20% co-insurance Outpatient: 80% coverage, 20% co-insurance; limited to 73 visits/cal yr (15 remaining) Home Health:  80% coverage, 20% co-insurance; limited to 87 visits/cal yr (67 remaining) DME: 80% coverage, 20% co-insurance Providers: in network  SECONDARY: Medicare Part A      Policy#:      Phone#: Development worker, community:       Phone#:   The Engineer, [employer]" for patients  in Inpatient Rehabilitation Facilities with attached "Privacy Act Tower Records" was provided and verbally reviewed with: Patient   Emergency Contact Information Contact Information       Name Relation Home Work Mobile    [place] Spouse     [phone]           Current Medical History  Patient Admitting Diagnosis: L BKA   History of Present Illness:  A 65 year old male (father in law of Dr. Evlyn Courier) with history of HTN, T2DM, ESRD- HD MWF, CAD s/p CABG 2021 and NSTEMI 04/2021 with cath 04/2021 revealing no options for revascularization, severe PAD s/p angioplasty with chronic left foot ulcer who was readmitted on 05/07/21 with increase in foot pain, edema and increase in drainage. He was started on broad spectrum antibiotics and Dr.Fields/[place] consulted in attempts for limb salvage. Due to rapid deterioration with concerns of osteomyelitis, BKA recommended and patient underwent said procedure by Dr. Sharol Given on 07/08. He was transfused with one unit PRBC pre-op and continues on HD with no plans for resumption of PD--catheter to be removed on outpatient basis. He has had issues with SOB/DOE with hypotension on 07/11 felt to be due to fluid overload and EDW challenged with addition of midodrine for BP support on HD days. 2D echo done showing EF 25-30% and Dr. Haroldine Laws consulted for input. He recommended optimization of fluid status per HD which has improved patient's symptoms. Therapy ongoing and patient continues to be limited by weakness, fatigue, right charcot foot and poor standing balance secondary to BKA. [name] recommended due to functional decline.    Patient's medical record from [facility] has been reviewed by the rehabilitation admission coordinator and physician.   Past Medical History      Past Medical History:  Diagnosis Date   Anemia of chronic disease     CAD (coronary artery disease)     Diabetes mellitus without complication (HCC)      ESRD (end stage renal disease) on dialysis ([place])      MWF in [place]   History of bleeding ulcers     History of TIAs     Hypertension     NSTEMI (non-ST elevated myocardial infarction) ([place]) 04/2021   PAD (peripheral artery disease) ([place])        Family History   family history includes Cancer in his brother and father; Stroke in his father and mother.   Prior Rehab/Hospitalizations Has the patient had prior rehab or hospitalizations prior to admission? Yes   Has the patient  had major surgery during 100 days prior to admission? Yes              Current Medications   Current Facility-Administered Medications:   acetaminophen (TYLENOL) tablet 650 mg, 650 mg, Oral, Q6H PRN, 650 mg at 05/18/21 1210 **OR** acetaminophen (TYLENOL) suppository 650 mg, 650 mg, Rectal, Q6H PRN, Persons, Bevely Palmer, PA   albuterol (PROVENTIL) (2.5 MG/3ML) 0.083% nebulizer solution 3 mL, 3 mL, Inhalation, Q4H PRN, Verlon Au, Jai-Gurmukh, MD   alum & mag hydroxide-simeth (MAALOX/MYLANTA) 200-200-20 MG/5ML suspension 15-30 mL, 15-30 mL, Oral, Q2H PRN, Persons, Bevely Palmer, PA   aspirin EC tablet 81 mg, 81 mg, Oral, Daily, Persons, Bevely Palmer, PA, 81 mg at 05/18/21 0847   atorvastatin (LIPITOR) tablet 80 mg, 80 mg, Oral, Daily, Persons, Bevely Palmer, PA, 80 mg at 05/18/21 0846   bisacodyl (DULCOLAX) EC tablet 5 mg, 5 mg, Oral, Daily PRN, Persons, Bevely Palmer, PA   calcitRIOL (ROCALTROL) capsule 0.25 mcg, 0.25 mcg, Oral, BID, Persons, Bevely Palmer, PA, 0.25 mcg at 05/18/21 0846   clonazePAM (KLONOPIN) tablet 0.5 mg, 0.5 mg, Oral, BID PRN, Persons, Bevely Palmer, PA, 0.5 mg at 05/18/21 0017   clopidogrel (PLAVIX) tablet 75 mg, 75 mg, Oral, Daily, Persons, Bevely Palmer, PA, 75 mg at 05/18/21 0847   Darbepoetin Alfa ([name]) injection 150 mcg, 150 mcg, Intravenous, Q Wed-HD, Persons, Bevely Palmer, PA, 150 mcg at 05/17/21 1037   escitalopram (LEXAPRO) tablet 10 mg, 10 mg, Oral, Daily, Persons, Bevely Palmer, PA, 10 mg at 05/18/21 0847    ferric citrate (AURYXIA) tablet 420 mg, 420 mg, Oral, TID, Justin Mend, MD, 420 mg at 05/18/21 0847   ferric gluconate (FERRLECIT) 125 mg in sodium chloride 0.9 % 100 mL IVPB, 125 mg, Intravenous, Q M,W,F-HD, Justin Mend, MD, Stopped at 05/17/21 1120   gentamicin cream (GARAMYCIN) 0.1 %, , Topical, Daily, Justin Mend, MD, Given at 05/17/21 1119   guaiFENesin-dextromethorphan (ROBITUSSIN DM) 100-10 MG/5ML syrup 15 mL, 15 mL, Oral, Q4H PRN, Persons, Bevely Palmer, PA, 15 mL at 05/16/21 0648   heparin injection 5,000 Units, 5,000 Units, Subcutaneous, Q8H, Persons, Bevely Palmer, PA, 5,000 Units at 05/18/21 0611   HYDROmorphone (DILAUDID) tablet 1 mg, 1 mg, Oral, Q6H PRN, Verlon Au, Jai-Gurmukh, MD, 1 mg at 05/17/21 2124   insulin aspart (novoLOG) injection 0-6 Units, 0-6 Units, Subcutaneous, TID WC, Persons, Bevely Palmer, PA, 1 Units at 05/18/21 1207   insulin glargine (LANTUS) injection 5 Units, 5 Units, Subcutaneous, QHS, Samtani, Jai-Gurmukh, MD, 5 Units at 05/17/21 2125   magnesium citrate solution 1 Bottle, 1 Bottle, Oral, Once PRN, Persons, Bevely Palmer, PA   midodrine (PROAMATINE) tablet 5 mg, 5 mg, Oral, Q M,W,F-HD, Gean Quint, MD, 5 mg at 05/17/21 0349   nutrition supplement (JUVEN) (JUVEN) powder packet 1 packet, 1 packet, Oral, BID BM, Persons, Bevely Palmer, [place], 1 packet at 05/18/21 0845   ondansetron (ZOFRAN) injection 4 mg, 4 mg, Intravenous, Q8H PRN, Persons, Bevely Palmer, PA, 4 mg at 05/12/21 0602   pantoprazole (PROTONIX) EC tablet 40 mg, 40 mg, Oral, Daily, Persons, Bevely Palmer, PA, 40 mg at 05/18/21 0847   pentoxifylline (TRENTAL) CR tablet 400 mg, 400 mg, Oral, TID WC, Persons, Bevely Palmer, PA, 400 mg at 05/18/21 1207   phenol (CHLORASEPTIC) mouth spray 1 spray, 1 spray, Mouth/Throat, PRN, Persons, Bevely Palmer, PA   polyethylene glycol (MIRALAX / GLYCOLAX) packet 17 g, 17 g, Oral, Daily PRN, Persons, Bevely Palmer, PA, 17 g at 05/14/21 1722  potassium chloride SA (KLOR-CON) CR tablet 20-40 mEq,  20-40 mEq, Oral, Daily PRN, Persons, Bevely Palmer, PA   sodium chloride flush (NS) 0.9 % injection 3 mL, 3 mL, Intravenous, Q12H, Persons, Bevely Palmer, PA, 3 mL at 05/17/21 2127   sorbitol 70 % solution 30 mL, 30 mL, Oral, BID, Samtani, Jai-Gurmukh, MD, 30 mL at 05/17/21 1249   tamsulosin (FLOMAX) capsule 0.4 mg, 0.4 mg, Oral, QHS, Persons, Bevely Palmer, PA, 0.4 mg at 05/17/21 2126   zinc sulfate capsule 220 mg, 220 mg, Oral, Daily, Persons, Bevely Palmer, PA, 220 mg at 05/18/21 0846   Patients Current Diet:  Diet Order                  Diet - low sodium heart healthy             Diet Carb Modified Fluid consistency: Thin; Room service appropriate? Yes  Diet effective now                         Precautions / Restrictions Precautions Precautions: Fall Precaution Comments: charcot foot Other Brace: Limb Guard Restrictions Weight Bearing Restrictions: No LLE Weight Bearing: Non weight bearing Other Position/Activity Restrictions: limb protector for mobility    Has the patient had 2 or more falls or a fall with injury in the past year? Yes   Prior Activity Level Community (5-7x/wk): Pt. was active inthe community PTA   Prior Functional Level Self Care: Did the patient need help bathing, dressing, using the toilet or eating? Independent   Indoor Mobility: Did the patient need assistance with walking from room to room (with or without device)? Needed some help   Stairs: Did the patient need assistance with internal or external stairs (with or without device)? Needed some help   Functional Cognition: Did the patient need help planning regular tasks such as shopping or remembering to take medications? Independent   Home Assistive Devices / Equipment Home Assistive Devices/Equipment: Scales, Blood pressure cuff, CBG Meter, Wheelchair, Environmental consultant (specify type), Eyeglasses, Cane (specify quad or straight), Grab bars in shower Home Equipment: Shower seat, [place] - single point, Walker - 2 wheels    Prior Device Use: Indicate devices/aids used by the patient prior to current illness, exacerbation or injury? Walker   Current Functional Level Cognition   Overall Cognitive Status: Within Functional Limits for tasks assessed Orientation Level: Oriented X4 General Comments: increasing anxiety with mobility limiting progression, pt repeatedly saying he is sorry that he is not doing better, reassured pt that progression is not linear and some days are better than others.    Extremity Assessment (includes Sensation/Coordination)   Upper Extremity Assessment: Generalized weakness (pt endorses completing HEP at least 2x each day)  Lower Extremity Assessment: Defer to PT evaluation LLE Deficits / Details: s/p BKA, shrinker sock in place, limb protector for mobility     ADLs   Overall ADL's : Needs assistance/impaired Eating/Feeding: Independent, Sitting Eating/Feeding Details (indicate cue type and reason): pt finished eating breakfast upon arrival Grooming: Set up, Sitting Grooming Details (indicate cue type and reason): at sink level while seated in the recliner Upper Body Bathing: Set up, Sitting Lower Body Bathing: Moderate assistance, Sitting/lateral leans Upper Body Dressing : Set up, Sitting Lower Body Dressing: Minimal assistance Lower Body Dressing Details (indicate cue type and reason): minA to don limb guard while long sitting in bed Toilet Transfer: Moderate assistance, Stand-pivot, RW Toilet Transfer Details (indicate cue type and reason): simulated  from EOB to recliner toward right Toileting- Water quality scientist and Hygiene: Moderate assistance, Sitting/lateral lean Functional mobility during ADLs: Moderate assistance, Rolling walker General ADL Comments: pt limited by instability, decreased activity tolerance, new LLE BKA     Mobility   Overal bed mobility: Needs Assistance Bed Mobility: Sit to Supine Supine to sit: HOB elevated, Min guard Sit to supine: Mod  assist General bed mobility comments: min guard for safety, pt requires increased time and effort, as well as vc for sequencing     Transfers   Overall transfer level: Needs assistance Equipment used: Rolling walker (2 wheeled) Transfers: Sit to/from Stand, Stand Pivot Transfers Sit to Stand: Min assist, Mod assist Stand pivot transfers: Mod assist Squat pivot transfers: Mod assist General transfer comment: min A to come to upright however unable to manage RW for pivot to chair, min A for stand pivot transfer to chair with belt assist, vc for coming up on ball of foot for pivot point, practiced sit>stand to RW able to achieve with mod A however increased time to achieve, vc for not overthinking, tried again with RW and unable to extend R knee to achieve upright, modA for coming to upright with face to face belt assit, continues to have difficulty extending knee, pt admits that he is very fearful about falling     Ambulation / Gait / Stairs / Wheelchair Mobility   Ambulation/Gait Ambulation/Gait assistance: Min assist, Mod assist Gait Distance (Feet): 1 Feet Assistive device: Rolling walker (2 wheeled) Gait Pattern/deviations:  (2 hops forward and attempt to hop back unable, attempted again however not able to clear foot) General Gait Details: unable     Posture / Balance Balance Overall balance assessment: Needs assistance Sitting-balance support: Feet supported, No upper extremity supported Sitting balance-Leahy Scale: Good Standing balance support: During functional activity, Bilateral upper extremity supported Standing balance-Leahy Scale: Poor Standing balance comment: heavy reliance on BUE and external support     Special needs/care consideration Dialysis: Hemodialysis Monday, Wednesday, and Friday, Wound Vac Yes to L BKA site, and Skin post op L BKA incision with VAC    Previous Home Environment (from acute therapy documentation) Living Arrangements: Spouse/significant other   Lives With: Spouse Available Help at Discharge: Family, Available PRN/intermittently Type of Home: House Home Layout: One level Home Access: Stairs to enter Entrance Stairs-Rails: Right Entrance Stairs-Number of Steps: 2-3 (Pt son is working on building a ramp.) Bathroom Shower/Tub: Multimedia programmer: Standard Home Care Services: Yes Type of Home Care Services: [place] (if known): interum Additional Comments: Pt has a wheelchair that was borrowed from friends. He has not needed it for mobility PTA.   Discharge Living Setting Plans for Discharge Living Setting: Patient's home Type of Home at Discharge: House Discharge Home Layout: One level Discharge Home Access: Stairs to enter Entrance Stairs-Rails: Right Entrance Stairs-Number of Steps: 2-3 Discharge Bathroom Shower/Tub: Walk-in shower Discharge Bathroom Toilet: Standard Discharge Bathroom Accessibility: Yes How Accessible: Accessible via walker Does the patient have any problems obtaining your medications?: Yes (Describe)   Social/Family/Support Systems Patient Roles: Spouse Contact Information: [phone] Anticipated Caregiver: Quenten Nawaz Anticipated Caregiver's Contact Information: [phone] Caregiver Availability: 24/7 Discharge Plan Discussed with Primary Caregiver: Yes Is Caregiver In Agreement with Plan?: Yes Does Caregiver/Family have Issues with Lodging/Transportation while Pt is in Rehab?: No   Goals Patient/Family Goal for Rehab: PT?OT Supervision Expected length of stay: 7-10 days Pt/Family Agrees to Admission and willing to participate: Yes Program Orientation Provided &  Reviewed with Pt/Caregiver Including Roles  & Responsibilities: Yes   Decrease burden of Care through IP rehab admission: Specialzed equipment needs, Decrease number of caregivers, Bowel and bladder program, and Patient/family education   Possible need for SNF placement upon discharge: not anticipated    Patient Condition: I have reviewed medical records from [facility], spoken with CM, and patient. I met with patient at the bedside and discussed via phone for inpatient rehabilitation assessment.  Patient will benefit from ongoing PT and OT, can actively participate in 3 hours of therapy a day 5 days of the week, and can make measurable gains during the admission.  Patient will also benefit from the coordinated team approach during an Inpatient Acute Rehabilitation admission.  The patient will receive intensive therapy as well as Rehabilitation physician, nursing, social worker, and care management interventions.  Due to safety, skin/wound care, disease management, medication administration, pain management, and patient education the patient requires 24 hour a day rehabilitation nursing.  The patient is currently min to mod Assist with mobility and basic ADLs.  Discharge setting and therapy post discharge at home with home health is anticipated.  Patient has agreed to participate in the Acute Inpatient Rehabilitation Program and will admit today.   Preadmission Screen Completed By:  Clemens Catholic with updates done by Retta Diones, 05/18/2021 12:46 PM ______________________________________________________________________   Discussed status with Dr. Dagoberto Ligas on 05/18/21 at 0930 and received approval for admission today.   Admission Coordinator:  Retta Diones, RN, time 1256/Date 05/18/21    Assessment/Plan: Diagnosis: Does the need for close, 24 hr/day Medical supervision in concert with the patient's rehab needs make it unreasonable for this patient to be served in a less intensive setting? Yes Co-Morbidities requiring supervision/potential complications: CAD/s/p CABG, s/p recent NSTEMI; ESRD/HD, DM, Severe PAD s/p L BKA; hypotension now with HD; R charcot foot Due to bowel management, safety, skin/wound care, disease management, medication administration, pain management, and patient  education, does the patient require 24 hr/day rehab nursing? Yes Does the patient require coordinated care of a physician, rehab nurse, PT, OT, and SLP to address physical and functional deficits in the context of the above medical diagnosis(es)? Yes Addressing deficits in the following areas: balance, endurance, locomotion, strength, transferring, bathing, dressing, feeding, grooming, and toileting Can the patient actively participate in an intensive therapy program of at least 3 hrs of therapy 5 days a week? Yes The potential for patient to make measurable gains while on inpatient rehab is good Anticipated functional outcomes upon discharge from inpatient rehab: supervision PT, supervision OT, n/a SLP Estimated rehab length of stay to reach the above functional goals is: 7-10 days Anticipated discharge destination: Home 10. Overall Rehab/Functional Prognosis: good     MD Signature:            Revision History                                                      Note Details  Jan Fireman, MD File Time 05/18/2021  1:12 PM  Author Type Physician Status Signed  Last Editor Courtney Heys, MD Service Nursing

## 2021-05-18 NOTE — Progress Notes (Signed)
[facility] Progress Note    Assessment/ Plan:   ESRD: has been on HD due to issues with CCPD: inadequate drainage/UF (catheter malfunction?) Outpatient orders: [place], MWF, F180, 3hrs, 2k, 2.5cal, 137 na, 35bicarb, 400bfr, 16g, edw 86.5kg. hep 2k bolus -discussed with his outpatient home therapies RN 7/5: no plans for further PD and is being scheduled to have PD cath removed as an outpatient, therefore no PD cath flushing needed.  Dressing to be changed daily - RN will facilitate; to be done here during HD MWF and on floor by dialysis RN on off days. Gent cream will be applied too.  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 [name]. [name]    Volume/ hypertension: EDW 86.5kg pre amputation. needs new edw post amputation-Will challenge Will see if we can get a standing wt close to d/c   Anemia of Chronic Kidney Disease: Hemoglobin 8.2. No active infection so will give 0.5g IV iron load over 4 HD treatments. Receives mircera 150 mcg, last dose 6/24, ordered aranesp here. Transfuse prn   Secondary Hyperparathyroidism/Hyperphosphatemia: decrease auryxia dose with phos running on low side, renal diet, monitor phos   DM2 w/ hyperglycemia -mgmt per primary service  Hypoalbuminemia, protein calorie malnutrition -push protein    # Additional recommendations: - Dose all meds for creatinine clearance < 10 ml/min  - Unless absolutely necessary, no MRIs with gadolinium.  - Implement save arm precautions.  Prefer needle sticks in the dorsum of the hands or wrists.  No blood pressure measurements in arm. - If blood transfusion is requested during hemodialysis sessions, please alert [place] prior to the session. - If a hemodialysis catheter line culture is requested, please alert [place] as only hemodialysis nurses are able to collect those specimens.    Jannifer Hick MD [facility] Pager  [phone]   Subjective:   Seen and examined in room. No new issues. Awaiting [name].  Small infiltration at end of HD yesterday, no AVF issues otherwise - thought to have moved arm.    Objective:   BP 111/65 (BP Location: Right Arm)   Pulse 91   Temp 97.7 F (36.5 C) (Oral)   Resp 14   Ht 5\' 11"  (1.803 m)   Wt 89.9 kg   SpO2 96%   BMI 27.64 kg/m   Intake/Output Summary (Last 24 hours) at 05/18/2021 0741 Last data filed at 05/18/2021 0600 Gross per 24 hour  Intake 510 ml  Output 3404 ml  Net -2894 ml    Weight change: -3.1 kg  Physical Exam: Gen:nad, nontoxic appearing CVS:rrr Resp:normal wob Abd:pd cath in place, soft, nt/nd KVQ:QVZDG RLE edema, LLE amputated with immobilizer on Neuro: awake, alert, speech clear and coherent, moves all ext spontaneously Access: avf +b/t - no large infiltration noted  Imaging: No results found.  Labs: BMET Recent Labs  Lab 05/12/21 0343 05/13/21 0159 05/14/21 0145 05/15/21 0837 05/17/21 0728  NA 130* 134* 133* 127* 133*  K 3.9 4.0 4.7 4.8 4.4  CL 93* 99 97* 94* 98  CO2 30 27 27 24 28   GLUCOSE 183* 209* 204* 179* 169*  BUN 54* 33* 50* 75* 62*  CREATININE 5.77* 3.95* 5.13* 6.16* 5.80*  CALCIUM 8.4* 8.3* 8.5* 8.5* 9.0  PHOS  --  3.2  --  3.4 2.8    CBC Recent Labs  Lab 05/12/21 0343 05/13/21 0159 05/14/21 0145 05/15/21 0837 05/17/21 0309  WBC 14.3* 9.5 11.2* 10.7* 10.2  NEUTROABS 11.9*  --   --   --   --  HGB 8.3* 8.0* 8.4* 8.2* 8.2*  HCT 26.2* 25.4* 27.4* 27.3* 26.6*  MCV 97.0 98.8 100.7* 101.1* 100.4*  PLT 186 175 178 190 192     Medications:     aspirin EC  81 mg Oral Daily   atorvastatin  80 mg Oral Daily   calcitRIOL  0.25 mcg Oral BID   clopidogrel  75 mg Oral Daily   darbepoetin ([name]) injection - DIALYSIS  150 mcg Intravenous Q Wed-HD   escitalopram  10 mg Oral Daily   ferric citrate  420 mg Oral TID   gentamicin cream   Topical Daily   heparin  5,000 Units Subcutaneous Q8H   insulin  aspart  0-6 Units Subcutaneous TID WC   insulin glargine  5 Units Subcutaneous QHS   midodrine  5 mg Oral Q M,W,F-HD   nutrition supplement (JUVEN)  1 packet Oral BID BM   pantoprazole  40 mg Oral Daily   pentoxifylline  400 mg Oral TID WC   sodium chloride flush  3 mL Intravenous Q12H   sorbitol  30 mL Oral BID   tamsulosin  0.4 mg Oral QHS   zinc sulfate  220 mg Oral Daily

## 2021-05-18 NOTE — Discharge Summary (Signed)
Physician Discharge Summary  Maxwell Harmon JYN:[id number] DOB: Feb 09, 1956 DOA: 05/07/2021  PCP: [name], DO  Admit date: 05/07/2021 Discharge date: 05/18/2021  Admitted From: Home Disposition: [name]  Recommendations for Outpatient Follow-up:  Follow up with PCP in 1-2 weeks Please obtain BMP/CBC in one week Please follow up with orthopedic surgery and nephrology as scheduled  Discharge Condition: Stable CODE STATUS: Full Diet recommendation: Renal dialysis diet as tolerated  Brief/Interim Summary: Patient is a pleasant 65 year old male history of hypertension diabetes type 2 ESRD on dialysis Monday Wednesday Friday with notable cardiovascular disease status post CABG 2021 NSTEMI of 2022 cath revealing no revascularization avenues at that time.  Patient has notable severe peripheral arterial disease status post angioplasty with recurrent ulcer, worsening pain and discharge from this foot after previous discharge.  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  Patient otherwise stable and agreeable for discharge to their facility today on 05/18/2021 otherwise medically stable and agreeable for discharge to inpatient rehab.    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  Cover with Aquacel Ag (LAWSON # [id number]) sheet and dry gauze/kerlix and tape.  Change Mon/Wed/Fri   Increase activity slowly   Complete by: As directed    Increase activity slowly   Complete by: As directed    Negative Pressure Wound Therapy - Incisional   Complete by: As directed    Show patient how to attach preveena vac   No wound care   Complete by: As directed       Allergies as of 05/18/2021   No Known Allergies      Medication List     STOP taking these medications    fluorouracil 5 % cream Commonly known as: EFUDEX   mupirocin ointment 2 % Commonly known as: BACTROBAN   Santyl ointment Generic drug: collagenase       TAKE these medications    Aspirin Low Dose 81 MG EC tablet Generic drug: aspirin Take 1 tablet (81 mg total) by mouth daily.   atorvastatin 40 MG tablet Commonly known as: LIPITOR Take 2 tablets (80 mg total) by mouth daily. What changed: how much to take   Basaglar KwikPen 100 UNIT/ML Inject 5 Units into the skin at bedtime.   Bayer Contour Monitor w/Device Kit 1 each by Does not apply route 4 (four) times daily. Test 4 x daily   blood glucose meter kit and supplies Kit Dispense based on patient and insurance preference. Use up to four times daily as directed. (FOR ICD-9 250.00, 250.01). What changed:  how much to take how to take this when to take this   calcitRIOL 0.25 MCG capsule Commonly known as:  ROCALTROL Take 0.25 mcg by mouth daily.   cinacalcet 30 MG tablet Commonly known as: SENSIPAR Take 30 mg by mouth once a week. Take on Mondays   clonazePAM 0.5 MG tablet Commonly known as: KLONOPIN Take 0.5 mg by mouth daily as needed for anxiety.   clopidogrel 75 MG tablet Commonly known as: PLAVIX Take 1 tablet by mouth daily.   escitalopram 10 MG tablet Commonly known as: LEXAPRO Take 1 tablet (10 mg total) by mouth daily.    ferric citrate 1 GM 210 MG(Fe) tablet Commonly known as: Auryxia Take 2 tablets (420 mg total) by mouth 3 (three) times daily. What changed: how much to take   gentamicin cream 0.1 % Commonly known as: GARAMYCIN Apply topically daily.   glucose blood test strip Commonly known as: Estate manager/land agent Use as instructed 4 x daily What changed:  how much to take how to take this when to take this   HYDROmorphone 2 MG tablet Commonly known as: DILAUDID Take 0.5 tablets (1 mg total) by mouth every 6 (six) hours as needed for moderate pain.   Insulin Pen Needle 31G X 8 MM Misc Commonly known as: B-D ULTRAFINE III SHORT PEN 1 each by Does not apply route as directed.   Lancets Misc 1 each by Does not apply route 4 (four) times daily.   midodrine 5 MG tablet Commonly known as: PROAMATINE Take 1 tablet (5 mg total) by mouth every Monday, Wednesday, and Friday with hemodialysis.   nitroGLYCERIN 0.2 mg/hr patch Commonly known as: NITRODUR - Dosed in mg/24 hr Place 1 patch (0.2 mg total) onto the skin daily.   nutrition supplement (JUVEN) Pack Take 1 packet by mouth 2 (two) times daily between meals.   pantoprazole 40 MG tablet Commonly known as: PROTONIX Take 1 tablet (40 mg total) by mouth daily.   pentoxifylline 400 MG CR tablet Commonly known as: TRENTAL Take 1 tablet (400 mg total) by mouth 3 (three) times daily with meals.   polyethylene glycol 17 g packet Commonly known as: MIRALAX / GLYCOLAX Take 17 g by mouth daily as needed for mild constipation.   sorbitol 70 % Soln Take 30 mLs by mouth 2 (two) times daily.   tamsulosin 0.4 MG Caps capsule Commonly known as: FLOMAX Take 0.4 mg by mouth at bedtime.   zinc sulfate 220 (50 Zn) MG capsule Take 1 capsule (220 mg total) by mouth daily.               Discharge Care Instructions  (From admission, onward)           Start     Ordered   05/18/21 0000  Discharge wound care:       Comments: Cleanse left  foot wound with NS and pat dry.  Cover with Aquacel Ag (LAWSON # [id number]) sheet and dry gauze/kerlix and tape.  Change Mon/Wed/Fri   05/18/21 1143            Follow-up Information     Suzan Slick, NP Follow up in 1 week(s).   Specialty: Orthopedic Surgery Contact information: [place] [address] [phone]         [facility] Follow up.   Specialty: Cardiology Why: [facility]  June 09, 2021 9:30 AM Contact information: [address] [id number] [place] [phone]  No Known Allergies  Consultations: Orthopedic surgery, nephrology   Procedures/Studies: PERIPHERAL VASCULAR CATHETERIZATION  Result Date: 04/19/2021 Formatting of this result is different from the original. [place]: 04/19/2021   PATIENT:  Maxwell Harmon  65 y.o. male   PRE-OPERATIVE DIAGNOSIS:  Atherosclerosis of native arteries of left lower extremity causing ulceration   POST-OPERATIVE DIAGNOSIS:  Same   PROCEDURE:  1) US guided right common femoral artery access 2) Left lower extremity angiogram with third order cannulation (160m total contrast) 3) Conscious sedation (167 minutes) 4) Left popliteal artery intravascular lithotripsy and angioplasty (5x660mShockwave) 5) Left posterior tibial artery angioplasty (3x8062m  SURGEON:  Surgeon(s) and Role:    * HawCherre RobinsD - Primary   ASSISTANT: none   ANESTHESIA:   local and IV sedation   EBL: min   BLOOD ADMINISTERED:none   DRAINS: none   LOCAL MEDICATIONS USED:  LIDOCAINE   SPECIMEN:  none   COUNTS: confirmed correct.   TOURNIQUET:  none   PATIENT DISPOSITION:  PACU - hemodynamically stable.  Delay start of Pharmacological VTE agent (>24hrs) due to surgical blood loss or risk of bleeding: no   INDICATION FOR PROCEDURE: ChaCORDEL DREWES a 65 20o. male with ulceration of his left foot and  severe popliteal and tibial disease demonstrated on angiogram last week. After careful discussion of risks, benefits, and alternatives the patient was offered intervention. We specifically discussed access site complications. The patient understood and wished to proceed.   OPERATIVE FINDINGS: Unchanged appearance of the left lower extremity vasculature from angiogram on Friday. Severe greater than 95% exophytic calcified stenosis artery behind the knee Greater than 95% stenosis in 2 areas of the proximal posterior tibial artery. 80% stenosis and long segment distal posterior tibial artery near the ankle with robust collateralization. I was not able to cross the distalmost posterior tibial artery lesion with a balloon. The remainder of the lesions totally resolved with lithotripsy and angioplasty/   DESCRIPTION OF PROCEDURE: After identification of the patient in the pre-operative holding area, the patient was transferred to the operating room. The patient was positioned supine on the operating room table. Anesthesia was induced. The groins was prepped and draped in standard fashion. A surgical pause was performed confirming correct patient, procedure, and operative location.   The right groin was anesthetized with subcutaneous injection of 1% lidocaine. Using ultrasound guidance, the right common femoral artery was accessed with micropuncture technique. Fluoroscopy was used to confirm cannulation over the femoral head. Sheathogram was not performed. The 68F sheath was upsized to 40F.   An 035 glidewire advantage was advanced into the distal aorta. Over the wire an omni flush catheter was advanced to the level of L2. Aortogram was performed - see above for details.   The left common iliac artery was selected with the [place] wire was advanced into the popliteal artery.  An 035 catheter was then advanced into the popliteal artery.  It was incredibly difficult to deliver a sheath into the proximal superficial  femoral artery.  A number of wire, catheter, and sheath exchanges were tried.  Ultimately a flexible 6 [place] 55 cm [place]sell sheath was advanced into the superficial femoral artery. Selective angiography was performed - see above for details.   The decision was made to interven. The patient was heparinized with 8000 units of heparin.  Activated clotting time measurements were used throughout the case to confirm adequate anticoagulation   The popliteal lesion was  crossed with an 035 Glidewire advantage and quick cross catheter.  A confirmatory angiogram confirmed that were in the true lumen.  An 014 wire was advanced in the posterior tibial artery.  The lesion was treated with several treatments of intravascular lithotripsy with shockwave 5 x 60 mm balloon system.  This completely resolved the stenosis. The proximal posterior tibial artery lesions were then angioplastied with a 3 x 80 mm coyote balloon.  This treatment resolved the proximal stenoses. I was able to cross the distal posterior tibial artery lesion with an 014 Glidewire, but was unable to track a low-profile balloon across this lesion.  I ultimately decided to end the case here because of the lengthy course and the significantly improved flow to the foot.   A Perclose device was used to close the arteriotomy. Hemostasis was excellent upon completion.   Conscious sedation was administered with the use of IV fentanyl and midazolam under continuous physician and nurse monitoring.  Heart rate, blood pressure, and oxygen saturation were continuously monitored.  Total sedation time was 167 minutes   Upon completion of the case instrument and sharps counts were confirmed correct. The patient was transferred to the PACU in good condition. I was present for all portions of the procedure.   PLAN: Dual antiplatelet therapy indefinitely.  High intensity statin therapy indefinitely.  Trial of local wound care.  We will reevaluate in the office in 1 month.  If if  wounds are not healing, we will offer retrograde access from the ankle to treat this distalmost lesion.   Yevonne Aline. Stanford Breed, MD [facility] Phone Number: [phone] 04/19/2021 11:58 AM  DG CHEST PORT 1 VIEW  Result Date: 05/14/2021 CLINICAL DATA:  65 year old male with cough. EXAM: PORTABLE CHEST 1 VIEW COMPARISON:  Chest radiograph dated 04/10/2021. FINDINGS: Bilateral mid to lower lung field streaky densities, new since the prior radiograph and may represent atypical infiltrate, edema, or atelectasis. Trace bilateral pleural effusions suspected. No pneumothorax. Stable cardiomegaly. Atherosclerotic calcification of the aorta. Median sternotomy wires. No acute osseous pathology. IMPRESSION: Bilateral mid to lower lung field streaky densities may represent atypical infiltrate, edema, or atelectasis. Electronically Signed   By: Anner Crete M.D.   On: 05/14/2021 19:41   DG Foot Complete Left  Result Date: 05/07/2021 CLINICAL DATA:  Posterior and lateral left foot pain and wounds for 3 months. History of diabetes. EXAM: LEFT FOOT - COMPLETE 3+ VIEW COMPARISON:  04/10/2021 FINDINGS: Extensive vascular calcifications in the soft tissues. Degenerative changes in the interphalangeal joints and intertarsal joints. Plantar calcaneal spur. No evidence of acute fracture or dislocation. No focal bone lesion or bone erosion. No cortical erosion or sclerosis to suggest osteomyelitis. No radiopaque soft tissue foreign bodies or soft tissue gas identified. IMPRESSION: No acute bony abnormalities. No radiographic evidence of osteomyelitis. Extensive vascular calcifications. Electronically Signed   By: Lucienne Capers M.D.   On: 05/07/2021 19:19   VAS [place] UPPER EXTREMITY ARTERIAL DUPLEX  Result Date: 05/08/2021  UPPER EXTREMITY DUPLEX STUDY Patient Name:  JAHMARI ESBENSHADE  Date of Exam:   05/08/2021 Medical Rec #: [id number]        Accession #:    [id number] Date of Birth: 01-21-1956         Patient Gender: M Patient Age:   65Y Exam Location:  [facility] Procedure:      VAS [place] UPPER EXTREMITY ARTERIAL DUPLEX Referring Phys: [id number] [place] --------------------------------------------------------------------------------  Indications: Intermittent pallor,  poor capillary refill, and weak radial pulse              in the left upper extremity. History:     Patient has a history of Left upper extremity AV fistula (basilic).  Risk Factors:  Hypertension, hyperlipidemia, Diabetes, coronary artery disease. Other Factors: PAD with history of left LE ischemia, ESRD Comparison Study: No prior study on file Performing Technologist: [name] RDMS,RVT  Examination Guidelines: A complete evaluation includes B-mode imaging, spectral Doppler, color Doppler, and power Doppler as needed of all accessible portions of each vessel. Bilateral testing is considered an integral part of a complete examination. Limited examinations for reoccurring indications may be performed as noted.   Summary:  Left: 50-74% stenosis noted at the anastomosis. Radial artery       appears patent with reversal of flow. *See table(s) above for measurements and observations. Electronically signed by Harold Barban MD on 05/08/2021 at 2:28:39 PM.    Final    VAS [place] LOWER EXTREMITY ARTERIAL DUPLEX  Result Date: 05/09/2021 LOWER EXTREMITY ARTERIAL DUPLEX STUDY Patient Name:  Maxwell Harmon  Date of Exam:   05/09/2021 Medical Rec #: [id number]        Accession #:    [id number] Date of Birth: 10/01/1956        Patient Gender: M Patient Age:   54Y Exam Location:  [facility] Procedure:      VAS [place] LOWER EXTREMITY ARTERIAL DUPLEX Referring Phys: [place] --------------------------------------------------------------------------------  Indications: Peripheral artery disease, and Prognosis for BKA, s/p Lt popliteal              artery intravascular lithotripsy and angioplasty, PTA angioplasty              on  04-19-2021.  Current ABI: N/A Comparison Study: 04-14-2021 Abdominal aortogram showed left popliteal artery                   occlusion, below-knee popliteal calcified but patent, pero A                   and ATA occluded, stenosis of the PTA.                    ABI w/ TBI 04-11-2021 Non-compressible bilaterally, RT TBI 29                   LT TBI 0. Performing Technologist: [name] RDMS,RVT  Examination Guidelines: A complete evaluation includes B-mode imaging, spectral Doppler, color Doppler, and power Doppler as needed of all accessible portions of each vessel. Bilateral testing is considered an integral part of a complete examination. Limited examinations for reoccurring indications may be performed as noted.   +-----------+--------+-----+--------------+----------------+-------------------+ LEFT       PSV cm/sRatioStenosis      Waveform        Comments            +-----------+--------+-----+--------------+----------------+-------------------+ CFA Prox   73                         biphasic                            +-----------+--------+-----+--------------+----------------+-------------------+ CFA Mid    61                         biphasic                            +-----------+--------+-----+--------------+----------------+-------------------+  CFA Distal 60                         biphasic                            +-----------+--------+-----+--------------+----------------+-------------------+ DFA        46                         biphasic                            +-----------+--------+-----+--------------+----------------+-------------------+ SFA Prox   51                         biphasic                            +-----------+--------+-----+--------------+----------------+-------------------+ SFA Mid    40                         monophasic                          +-----------+--------+-----+--------------+----------------+-------------------+ SFA  Distal 66                         monophasic                          +-----------+--------+-----+--------------+----------------+-------------------+ POP Prox   125                        monophasic                          +-----------+--------+-----+--------------+----------------+-------------------+ POP Mid    413          75-99%        monophasic      Post-stenotic                               stenosis                      turbulence          +-----------+--------+-----+--------------+----------------+-------------------+ POP Distal 47                         monophasic                          +-----------+--------+-----+--------------+----------------+-------------------+ TP Trunk   23                         monophasic                          +-----------+--------+-----+--------------+----------------+-------------------+ ATA Mid    14                         dampened  monophasic                          +-----------+--------+-----+--------------+----------------+-------------------+ ATA Distal              occluded                                          +-----------+--------+-----+--------------+----------------+-------------------+ PTA Prox   75                                                             +-----------+--------+-----+--------------+----------------+-------------------+ PTA Mid    34                                                             +-----------+--------+-----+--------------+----------------+-------------------+ PTA Distal 47                                                             +-----------+--------+-----+--------------+----------------+-------------------+ PERO Distal             occluded                                          +-----------+--------+-----+--------------+----------------+-------------------+  Summary:  Left: 75-99% stenosis noted in the popliteal artery.  See table(s) above for measurements and observations. Electronically signed by Ruta Hinds MD on 05/09/2021 at 3:50:08 PM.    Final    ECHOCARDIOGRAM LIMITED  Result Date: 05/15/2021    ECHOCARDIOGRAM LIMITED REPORT   Patient Name:   Maxwell Harmon Date of Exam: 05/15/2021 Medical Rec #:  [id number]       Height:       71.0 in Accession #:    [id number]      Weight:       199.5 lb Date of Birth:  21-Oct-1956       BSA:          2.106 m Patient Age:    47 years        BP:           136/68 mmHg Patient Gender: M               HR:           99 bpm. Exam Location:  Inpatient Procedure: 2D Echo and Intracardiac Opacification Agent Indications:    I50.9* Heart failure (unspecified)  History:        Patient has prior history of Echocardiogram examinations, most                 recent 04/10/2021. Mild aortic stenosis. S/P foot amputation.  Sonographer:    Merrie Roof RDCS Referring Phys: [phone] AMY D CLEGG IMPRESSIONS  1. Left ventricular  ejection fraction, by estimation, is 25 to 30%. Left ventricular ejection fraction by 3D volume is 30 %. The left ventricle has severely decreased function. The left ventricle demonstrates regional wall motion abnormalities (see scoring diagram/findings for description). There is severe hypokinesis of the mid-to-apical inferior, mid-to-apical septal, mid-to-apical anterior, apical lateral LV segments. The apical segments and apex appear akinetic. There is no LV thrombus visualized. The left ventricular internal cavity size was mildly dilated.  2. Right ventricular systolic function is moderately reduced. The right ventricular size is normal.  3. The mitral valve is degenerative. Moderate mitral annular calcification.  4. The inferior vena cava is dilated in size with <50% respiratory variability, suggesting right atrial pressure of 15 mmHg. Comparison(s): Compared to prior TTE in 04/2021, there is no significant change. FINDINGS  Left  Ventricle: Left ventricular ejection fraction, by estimation, is 25 to 30%. Left ventricular ejection fraction by 3D volume is 30 %. The left ventricle has severely decreased function. The left ventricle demonstrates regional wall motion abnormalities. There is severe hypokinesis of the mid-to-apical inferior, mid-to-apical septal, mid-to-apical anterior, apical lateral LV segments. The apical segments and apex appear akinetic. There is no LV thrombus visualized. Definity contrast agent was given IV to delineate the left ventricular endocardial borders. The left ventricular internal cavity size was mildly dilated. There is no left ventricular hypertrophy. Right Ventricle: The right ventricular size is normal. Right ventricular systolic function is moderately reduced. Pericardium: There is no evidence of pericardial effusion. Mitral Valve: The mitral valve is degenerative in appearance. There is mild thickening of the mitral valve leaflet(s). There is mild calcification of the mitral valve leaflet(s). Moderate mitral annular calcification. Tricuspid Valve: The tricuspid valve is normal in structure. Venous: The inferior vena cava is dilated in size with less than 50% respiratory variability, suggesting right atrial pressure of 15 mmHg. LEFT VENTRICLE PLAX 2D LVIDd:         6.00 cm LVIDs:         5.00 cm LV PW:         1.00 cm         3D Volume EF LV IVS:        1.00 cm         LV 3D EF:    Left                                             ventricular                                             ejection LV Volumes (MOD)                            fraction by LV vol d, MOD    234.0 ml                   3D volume A2C:                                        is 30 %. LV vol d, MOD    211.0 ml A4C: LV vol s, MOD  173.0 ml      3D Volume EF: A2C:                           3D EF:        30 % LV vol s, MOD    132.0 ml      LV EDV:       212 ml A4C:                           LV ESV:       148 ml LV SV MOD A2C:   61.0 ml        LV SV:        63 ml LV SV MOD A4C:   211.0 ml LV SV MOD BP:    67.9 ml IVC IVC diam: 2.20 cm LEFT ATRIUM         Index LA diam:    4.80 cm 2.28 cm/m   AORTA Ao Root diam: 3.30 cm Gwyndolyn Kaufman MD Electronically signed by Gwyndolyn Kaufman MD Signature Date/Time: 05/15/2021/5:44:10 PM    Final      Subjective: No acute issues/events overnight   Discharge Exam: Vitals:   05/18/21 0509 05/18/21 0848  BP: 111/65 100/61  Pulse: 91 93  Resp: 14 20  Temp: 97.7 F (36.5 C) 98 F (36.7 C)  SpO2: 96% 93%   Vitals:   05/17/21 1234 05/17/21 2107 05/18/21 0509 05/18/21 0848  BP: 120/67 113/66 111/65 100/61  Pulse: 93 98 91 93  Resp: '16 18 14 20  ' Temp: 98 F (36.7 C) 98 F (36.7 C) 97.7 F (36.5 C) 98 F (36.7 C)  TempSrc: Oral  Oral Oral  SpO2: 100% 97% 96% 93%  Weight:      Height:        General:  Pleasantly resting in bed, No acute distress. HEENT:  Normocephalic atraumatic.  Sclerae nonicteric, noninjected.  Extraocular movements intact bilaterally. Neck:  Without mass or deformity.  Trachea is midline. Lungs:  Clear to auscultate bilaterally without rhonchi, wheeze, or rales. Heart:  Regular rate and rhythm.  Without murmurs, rubs, or gallops. Abdomen:  Soft, nontender, nondistended.  Without guarding or rebound. Extremities: Without cyanosis, clubbing, edema, or obvious deformity.  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     Microbiology: Recent Results (from the past 240 hour(s))  Surgical pcr screen     Status: None   Collection Time: 05/12/21 12:45 AM   Specimen: Nasal Mucosa; Nasal Swab  Result Value Ref Range Status   MRSA, PCR NEGATIVE NEGATIVE Final   Staphylococcus aureus NEGATIVE NEGATIVE Final    Comment:  (NOTE) The Xpert SA Assay (FDA approved for NASAL specimens in patients 74 years of age and older), is one component of a comprehensive surveillance program. It is not intended to diagnose infection nor to guide or monitor treatment. Performed at [facility], [place] [address] [place], [place] [address]      Labs: BNP (last 3 results) Recent Labs    04/10/21 1033  BNP 0,722.5*   Basic Metabolic Panel: Recent Labs  Lab 05/12/21 0343 05/13/21 0159 05/14/21 0145 05/15/21 0837 05/17/21 0728  NA 130* 134* 133* 127*  133*  K 3.9 4.0 4.7 4.8 4.4  CL 93* 99 97* 94* 98  CO2 '30 27 27 24 28  ' GLUCOSE 183* 209* 204* 179* 169*  BUN 54* 33* 50* 75* 62*  CREATININE 5.77* 3.95* 5.13* 6.16* 5.80*  CALCIUM 8.4* 8.3* 8.5* 8.5* 9.0  MG  --   --  2.0  --   --   PHOS  --  3.2  --  3.4 2.8   Liver Function Tests: Recent Labs  Lab 05/12/21 0343 05/15/21 0837 05/17/21 0728  AST 16  --   --   ALT 13  --   --   ALKPHOS 115  --   --   BILITOT 1.2  --   --   PROT 5.4*  --   --   ALBUMIN 1.9* 1.8* 1.8*   No results for input(s): LIPASE, AMYLASE in the last 168 hours. No results for input(s): AMMONIA in the last 168 hours. CBC: Recent Labs  Lab 05/12/21 0343 05/13/21 0159 05/14/21 0145 05/15/21 0837 05/17/21 0309  WBC 14.3* 9.5 11.2* 10.7* 10.2  NEUTROABS 11.9*  --   --   --   --   HGB 8.3* 8.0* 8.4* 8.2* 8.2*  HCT 26.2* 25.4* 27.4* 27.3* 26.6*  MCV 97.0 98.8 100.7* 101.1* 100.4*  PLT 186 175 178 190 192   Cardiac Enzymes: No results for input(s): CKTOTAL, CKMB, CKMBINDEX, TROPONINI in the last 168 hours. BNP: Invalid input(s): POCBNP CBG: Recent Labs  Lab 05/17/21 1233 05/17/21 1648 05/17/21 2106 05/18/21 0644 05/18/21 1123  GLUCAP 106* 168* 176* 127* 189*   D-Dimer No results for input(s): DDIMER in the last 72 hours. Hgb A1c No results for input(s): HGBA1C in the last 72 hours. Lipid Profile No results for input(s): CHOL, HDL, LDLCALC, TRIG, CHOLHDL,  LDLDIRECT in the last 72 hours. Thyroid function studies No results for input(s): TSH, T4TOTAL, T3FREE, THYROIDAB in the last 72 hours.  Invalid input(s): FREET3 Anemia work up No results for input(s): VITAMINB12, FOLATE, FERRITIN, TIBC, IRON, RETICCTPCT in the last 72 hours. Urinalysis No results found for: COLORURINE, APPEARANCEUR, [place], [place], GLUCOSEU, [place], [place], [place], PROTEINUR, UROBILINOGEN, NITRITE, LEUKOCYTESUR Sepsis Labs Invalid input(s): PROCALCITONIN,  WBC,  LACTICIDVEN Microbiology Recent Results (from the past 240 hour(s))  Surgical pcr screen     Status: None   Collection Time: 05/12/21 12:45 AM   Specimen: Nasal Mucosa; Nasal Swab  Result Value Ref Range Status   MRSA, PCR NEGATIVE NEGATIVE Final   Staphylococcus aureus NEGATIVE NEGATIVE Final    Comment: (NOTE) The Xpert SA Assay (FDA approved for NASAL specimens in patients 62 years of age and older), is one component of a comprehensive surveillance program. It is not intended to diagnose infection nor to guide or monitor treatment. Performed at [facility], [place] [address], [place], [place] [address]      Time coordinating discharge: Over 30 minutes  SIGNED:   Little Ishikawa, DO [facility] 05/18/2021, 4:12 PM Pager   If 7PM-7AM, please contact night-coverage [email or web address]

## 2021-05-18 NOTE — Plan of Care (Signed)
  Problem: Clinical Measurements: Goal: Ability to avoid or minimize complications of infection will improve Outcome: Adequate for Discharge   Problem: Skin Integrity: Goal: Skin integrity will improve Outcome: Adequate for Discharge   Problem: Clinical Measurements: Goal: Will remain free from infection Outcome: Adequate for Discharge Goal: Respiratory complications will improve Outcome: Adequate for Discharge Goal: Cardiovascular complication will be avoided Outcome: Adequate for Discharge   Problem: Activity: Goal: Risk for activity intolerance will decrease Outcome: Adequate for Discharge   Problem: Nutrition: Goal: Adequate nutrition will be maintained Outcome: Adequate for Discharge   Problem: Coping: Goal: Level of anxiety will decrease Outcome: Adequate for Discharge   Problem: Elimination: Goal: Will not experience complications related to bowel motility Outcome: Adequate for Discharge Goal: Will not experience complications related to urinary retention Outcome: Adequate for Discharge   Problem: Pain Managment: Goal: General experience of comfort will improve Outcome: Adequate for Discharge   Problem: Safety: Goal: Ability to remain free from injury will improve Outcome: Adequate for Discharge   Problem: Skin Integrity: Goal: Risk for impaired skin integrity will decrease Outcome: Adequate for Discharge   Problem: Education: Goal: Knowledge of disease and its progression will improve Outcome: Adequate for Discharge Goal: Individualized Educational Video(s) Outcome: Adequate for Discharge   Problem: Fluid Volume: Goal: Compliance with measures to maintain balanced fluid volume will improve Outcome: Adequate for Discharge   Problem: Health Behavior/Discharge Planning: Goal: Ability to manage health-related needs will improve Outcome: Adequate for Discharge   Problem: Nutritional: Goal: Ability to make healthy dietary choices will improve Outcome:  Adequate for Discharge   Problem: Clinical Measurements: Goal: Complications related to the disease process, condition or treatment will be avoided or minimized Outcome: Adequate for Discharge   Problem: Acute Rehab PT Goals(only PT should resolve) Goal: Pt Will Go Supine/Side To Sit Outcome: Adequate for Discharge Goal: Pt Will Go Sit To Supine/Side Outcome: Adequate for Discharge Goal: Patient Will Transfer Sit To/From Stand Outcome: Adequate for Discharge Goal: Pt Will Ambulate Outcome: Adequate for Discharge   Problem: Acute Rehab OT Goals (only OT should resolve) Goal: Pt. Will Perform Lower Body Bathing Outcome: Adequate for Discharge Goal: Pt. Will Perform Lower Body Dressing Outcome: Adequate for Discharge Goal: Pt. Will Transfer To Toilet Outcome: Adequate for Discharge Goal: Pt. Will Perform Toileting-Clothing Manipulation Outcome: Adequate for Discharge Goal: OT Additional ADL Goal #1 Outcome: Adequate for Discharge

## 2021-05-18 NOTE — Progress Notes (Signed)
Physical Therapy Treatment Patient Details Name: Maxwell Harmon MRN: [id number] DOB: Aug 31, 1956 Today's Date: 05/18/2021    History of Present Illness Pt is a 65 y.o. male admitted 7/3 with diagnosis of cellulitis LLE. Pt presented with gangrenous ischemic changes of the left heel ulcer. 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 PMH: CHF, ESRD (switched from PD to HB 04/2021), hypertension, CAD s/p CABG 2021, PAD h/o limb ischemia, Charcot foot, hyperlipidemia, diabetes, TIA    PT Comments    Pt in bed on entry, reports that transfer back to bed went well because there were 2 big NT that assisted. Pt limited in safe mobility by anxiety about falling which limits his ability to sequence movement efficiently to perform transfers. Pt with good LE mobility, worked on bed exercises prior to coming to [facility] with min guard. Pt requires min-modA for stand pivot and sit to stand transfers. Had pt work on chair push ups with increased rate to mimic sequencing needed to advance gait.Pt with good hip and upper thigh clearance with practice. Educated about carry over to hopping pattern for ambulation. Pt verbalizes understanding however with increased difficulty in standing. Pt will benefit from equipment at [facility] which will provide increased feeling of security. Pt remains driven to return to PLOF and is eager to get to [name]. Plan for discharge there this afternoon.    Follow Up Recommendations  [name]     Equipment Recommendations  Wheelchair (measurements PT)    Recommendations for Other Services Rehab consult     Precautions / Restrictions Precautions Precautions: Fall Precaution Comments: charcot foot Required Braces or Orthoses: Other Brace Other Brace: Limb Guard Restrictions Weight Bearing Restrictions: No LLE Weight Bearing: Non weight bearing Other Position/Activity Restrictions: limb protector for mobility    Mobility  Bed Mobility Overal bed mobility: Needs Assistance Bed Mobility: Sit to Supine     Supine  to sit: HOB elevated;Min guard     General bed mobility comments: min guard for safety, pt requires increased time and effort, as well as vc for sequencing    Transfers Overall transfer level: Needs assistance Equipment used: Rolling walker (2 wheeled) Transfers: Sit to/from [facility] Sit to Stand: Min assist;Mod assist Stand pivot transfers: Mod assist       General transfer comment: min A to come to upright however unable to manage RW for pivot to chair, min A for stand pivot transfer to chair with belt assist, vc for coming up on ball of foot for pivot point, practiced sit>stand to RW able to achieve with mod A however increased time to achieve, vc for not overthinking, tried again with RW and unable to extend R knee to achieve upright, modA for coming to upright with face to face belt assit, continues to have difficulty extending knee, pt admits that he is very fearful about falling  Ambulation/Gait             General Gait Details: unable         Balance Overall balance assessment: Needs assistance Sitting-balance support: Feet supported;No upper extremity supported Sitting balance-Leahy Scale: Good     Standing balance support: During functional activity;Bilateral upper extremity supported Standing balance-Leahy Scale: Poor Standing balance comment: heavy reliance on BUE and external support                            Cognition Arousal/Alertness: Awake/alert Behavior During Therapy: Flat affect;Anxious Overall Cognitive Status: Within Functional Limits for tasks assessed  General Comments: increasing anxiety with mobility limiting progression, pt repeatedly saying he is sorry that he is not doing better, reassured pt that progression is not linear and some days are better than others.      Exercises Amputee Exercises Quad Sets: AROM;Both;10 reps;Supine Knee Flexion: AROM;Both;10  reps;Supine Knee Extension: AROM;Left;5 reps;Seated Other Exercises Other Exercises: chair push ups 4x5, worked on increasing velocity to mimic hopping    General Comments General comments (skin integrity, edema, etc.): Pt with continuing dissapointment with progression, including a lot of self doubt, educated pt that he has enough strength,      Pertinent Vitals/Pain Pain Assessment: Faces Faces Pain Scale: Hurts little more Pain Location: LLE Pain Descriptors / Indicators: Sore;Operative site guarding Pain Intervention(s): Limited activity within patient's tolerance;Monitored during session;Repositioned     PT Goals (current goals can now be found in the care plan section) Acute Rehab PT Goals Patient Stated Goal: I need to get stronger so I can do more for myself PT Goal Formulation: With patient/family Time For Goal Achievement: 05/27/21 Potential to Achieve Goals: Good Progress towards PT goals: Progressing toward goals    Frequency    Min 3X/week      PT Plan Current plan remains appropriate       AM-PAC PT "6 Clicks" Mobility   Outcome Measure  Help needed turning from your back to your side while in a flat bed without using bedrails?: A Little Help needed moving from lying on your back to sitting on the side of a flat bed without using bedrails?: None Help needed moving to and from a bed to a chair (including a wheelchair)?: A Lot Help needed standing up from a chair using your arms (e.g., wheelchair or bedside chair)?: A Little Help needed to walk in hospital room?: A Lot Help needed climbing 3-5 steps with a railing? : Total 6 Click Score: 15    End of Session Equipment Utilized During Treatment: Gait belt Activity Tolerance: Patient tolerated treatment well Patient left: in chair;with call bell/phone within reach;Other (comment) (limb protector off for comfort) Nurse Communication: Mobility status PT Visit Diagnosis: Other abnormalities of gait and  mobility (R26.89);Pain;Muscle weakness (generalized) (M62.81) Pain - Right/Left: Left Pain - part of body: Leg     Time: 4270-6237 PT Time Calculation (min) (ACUTE ONLY): 27 min  Charges:  $Therapeutic Exercise: 8-22 mins $Therapeutic Activity: 8-22 mins                     Albert Devaul B. Migdalia Dk PT, DPT Acute Rehabilitation Services Pager [phone] Office [phone]    [place] 05/18/2021, 10:58 AM

## 2021-05-18 NOTE — Progress Notes (Signed)
Patient is status post transtibial amputation 6 days ago.  He is comfortable and anticipating discharging to inpatient rehab.  Wound VAC is functioning well 0 cc in the canister. Patient will need follow-up with our office in 1 week after discharge from rehab

## 2021-05-18 NOTE — H&P (Signed)
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 Evlyn Courier) with history of HTN, T2DM, ESRD- HD MWF, CAD s/p CABG 2021 and NSTEMI 04/2021 with cath 04/2021 revealing no options for revascularization, severe PAD s/p angioplasty with chronic left foot ulcer who was readmitted on 05/07/21 with increase in foot pain, edema and increase in drainage. He was started on broad spectrum antibiotics and Dr.Fields/Hawken consulted in attempts for limb salvage. Due to rapid deterioration with concerns of osteomyelitis, BKA recommended and patient underwent said procedure by Dr. Sharol Given on 07/08. He was transfused with one unit PRBC pre-op and continues on HD with no plans for resumption of PD--catheter to be removed on outpatient basis. He has had issues with SOB/DOE with hypotension on 07/11 felt to be due to fluid overload and EDW challenged with addition of midodrine for BP support on HD days. 2D echo done showing EF 25-30% and Dr. Haroldine Laws consulted for input. He recommended optimization of fluid status per HD which has improved patient's symptoms. Therapy ongoing and patient continues to be limited by weakness, fatigue, right charcot foot and poor standing balance secondary to BKA. [name] recommended due to functional decline.    Pt reports pain "not bad"- mainly having residual limb pain, however occ phantom pain.  LBM x2 yesterday Oliguric- does form urine! Required an in/out cath yesterday x1.  Usually voids 1-2x/day.    Review of Systems  Constitutional:  Negative for chills and fever.  HENT:  Negative for hearing loss.   Eyes:  Negative for blurred vision and double vision.  Respiratory:  Positive for cough. Negative for shortness of breath.   Cardiovascular:  Negative for chest pain, palpitations and leg swelling.  Gastrointestinal:  Negative for  constipation, heartburn and nausea.  Genitourinary:  Negative for dysuria and urgency.  Musculoskeletal:  Positive for myalgias (buttock pain.).  Skin:  Negative for itching.  Neurological:  Positive for sensory change and weakness. Negative for dizziness, tingling, tremors and headaches.  Psychiatric/Behavioral:  Negative for depression. The patient has insomnia. The patient is not nervous/anxious.   All other systems reviewed and are negative.   Past Medical History:  Diagnosis Date   Diabetes mellitus without complication ([place])    History of bleeding ulcers    History of TIAs    Hypertension    Low kidney function     Past Surgical History:  Procedure Laterality Date   ABDOMINAL AORTOGRAM W/LOWER EXTREMITY Bilateral 04/14/2021   Procedure: ABDOMINAL AORTOGRAM W/LOWER EXTREMITY;  Surgeon: Elam Dutch, MD;  Location: [facility];  Service: Vascular;  Laterality: Bilateral;   AMPUTATION Left 05/12/2021   Procedure: LEFT BELOW KNEE AMPUTATION;  Surgeon: Newt Minion, MD;  Location: [facility];  Service: Orthopedics;  Laterality: Left;   CATARACT EXTRACTION W/PHACO Left 02/15/2014   Procedure: CATARACT EXTRACTION PHACO AND INTRAOCULAR LENS PLACEMENT (IOC);  Surgeon: Tonny Branch, MD;  Location: [facility];  Service: Ophthalmology;  Laterality: Left;  CDE 10.84   CATARACT EXTRACTION W/PHACO Right 12/14/2013   Procedure: CATARACT EXTRACTION PHACO AND INTRAOCULAR LENS PLACEMENT (IOC);  Surgeon: Tonny Branch, MD;  Location: [facility];  Service: Ophthalmology;  Laterality: Right;  CDE:  16.30   CHOLECYSTECTOMY  02/2013   EYE SURGERY     IR FLUORO GUIDE CV LINE RIGHT  01/05/2019  IR US GUIDE VASC ACCESS RIGHT  01/05/2019   LAPAROSCOPIC NEPHRECTOMY Right 01/08/2019   Procedure: LAPAROSCOPIC RADICAL NEPHRECTOMY;  Surgeon: Raynelle Bring, MD;  Location: [facility];  Service: Urology;  Laterality: Right;   LEFT HEART CATH AND CORS/GRAFTS ANGIOGRAPHY N/A 04/14/2021   Procedure: LEFT HEART CATH AND CORS/GRAFTS  ANGIOGRAPHY;  Surgeon: Jolaine Artist, MD;  Location: [facility];  Service: Cardiovascular;  Laterality: N/A;   PENILE PROSTHESIS IMPLANT     PERIPHERAL VASCULAR INTERVENTION Left 04/19/2021   Procedure: PERIPHERAL VASCULAR INTERVENTION;  Surgeon: Cherre Robins, MD;  Location: [facility];  Service: Cardiovascular;  Laterality: Left;   TOE AMPUTATION Right 2013   [facility]   Family History  Problem Relation Age of Onset   Stroke Mother    Stroke Father    Cancer Father    Cancer Brother    Social History:  reports that he has never smoked. He has never used smokeless tobacco. He reports current alcohol use. He reports that he does not use drugs. Allergies: No Known Allergies Medications Prior to Admission  Medication Sig Dispense Refill   aspirin 81 MG EC tablet Take 1 tablet (81 mg total) by mouth daily. 30 tablet 2   atorvastatin (LIPITOR) 40 MG tablet Take 2 tablets (80 mg total) by mouth daily. 60 tablet 2   AURYXIA 1 GM 210 MG(Fe) tablet Take 630 mg by mouth 3 (three) times daily.     blood glucose meter kit and supplies KIT Dispense based on patient and insurance preference. Use up to four times daily as directed. (FOR ICD-9 250.00, 250.01). 1 each 0   Blood Glucose Monitoring Suppl (BAYER CONTOUR MONITOR) w/Device KIT 1 each by Does not apply route 4 (four) times daily. Test 4 x daily 1 kit 0   calcitRIOL (ROCALTROL) 0.25 MCG capsule Take 0.25 mcg by mouth daily.     cinacalcet (SENSIPAR) 30 MG tablet Take 30 mg by mouth once a week. Take on Mondays     clonazePAM (KLONOPIN) 0.5 MG tablet Take 0.5 mg by mouth daily as needed for anxiety.     clopidogrel (PLAVIX) 75 MG tablet Take 1 tablet by mouth daily.     collagenase (SANTYL) ointment Apply topically daily. 30 g 0   fluorouracil (EFUDEX) 5 % cream Apply topically 2 (two) times daily.     glucose blood (BAYER CONTOUR TEST) test strip Use as instructed 4 x daily 150 each 5   Insulin  Glargine (BASAGLAR KWIKPEN) 100 UNIT/ML Inject 5 Units into the skin at bedtime. 3 mL 1   Insulin Pen Needle (B-D ULTRAFINE III SHORT PEN) 31G X 8 MM MISC 1 each by Does not apply route as directed. 100 each 3   Lancets MISC 1 each by Does not apply route 4 (four) times daily. 150 each 5   mupirocin ointment (BACTROBAN) 2 % Apply topically daily. (Patient taking differently: Apply 1 application topically daily.) 22 g 0   nitroGLYCERIN (NITRODUR - DOSED IN MG/24 HR) 0.2 mg/hr patch Place 1 patch (0.2 mg total) onto the skin daily. 30 patch 12   pentoxifylline (TRENTAL) 400 MG CR tablet Take 1 tablet (400 mg total) by mouth 3 (three) times daily with meals. 90 tablet 3   tamsulosin (FLOMAX) 0.4 MG CAPS capsule Take 0.4 mg by mouth at bedtime.     escitalopram (LEXAPRO) 10 MG tablet Take 1 tablet (10 mg total) by mouth daily. 30 tablet 2  Drug Regimen Review  Drug regimen was reviewed and remains appropriate with no significant issues identified  Home: Home Living Family/patient expects to be discharged to:: Private residence Living Arrangements: Spouse/significant other Available Help at Discharge: Family, Available PRN/intermittently Type of Home: House Home Access: Stairs to enter [employer] of Steps: 2-3 (Pt son is working on building a ramp.) Entrance Stairs-Rails: Right Home Layout: One level Bathroom Shower/Tub: Multimedia programmer: Standard Home Equipment: Civil engineer, contracting, Radio producer - single point, Environmental consultant - 2 wheels Additional Comments: Pt has a wheelchair that was borrowed from friends. He has not needed it for mobility PTA.  Lives With: Spouse   Functional History: Prior Function Level of Independence: Independent with assistive device(s) Comments: amb household distances with RW. Multiple falls.  Spouse assist with community mobility, meals, and home management.  Functional Status:  Mobility: Bed Mobility Overal bed mobility: Needs Assistance Bed  Mobility: Sit to Supine Supine to sit: HOB elevated, Min guard Sit to supine: Mod assist General bed mobility comments: min guard for safety, pt requires increased time and effort, as well as vc for sequencing Transfers Overall transfer level: Needs assistance Equipment used: Rolling walker (2 wheeled) Transfers: Sit to/from Stand, Stand Pivot Transfers Sit to Stand: Min assist, Mod assist Stand pivot transfers: Mod assist Squat pivot transfers: Mod assist General transfer comment: min A to come to upright however unable to manage RW for pivot to chair, min A for stand pivot transfer to chair with belt assist, vc for coming up on ball of foot for pivot point, practiced sit>stand to RW able to achieve with mod A however increased time to achieve, vc for not overthinking, tried again with RW and unable to extend R knee to achieve upright, modA for coming to upright with face to face belt assit, continues to have difficulty extending knee, pt admits that he is very fearful about falling Ambulation/Gait Ambulation/Gait assistance: Min assist, Mod assist Gait Distance (Feet): 1 Feet Assistive device: Rolling walker (2 wheeled) Gait Pattern/deviations:  (2 hops forward and attempt to hop back unable, attempted again however not able to clear foot) General Gait Details: unable    ADL: ADL Overall ADL's : Needs assistance/impaired Eating/Feeding: Independent, Sitting Eating/Feeding Details (indicate cue type and reason): pt finished eating breakfast upon arrival Grooming: Set up, Sitting Grooming Details (indicate cue type and reason): at sink level while seated in the recliner Upper Body Bathing: Set up, Sitting Lower Body Bathing: Moderate assistance, Sitting/lateral leans Upper Body Dressing : Set up, Sitting Lower Body Dressing: Minimal assistance Lower Body Dressing Details (indicate cue type and reason): minA to don limb guard while long sitting in bed Toilet Transfer: Moderate  assistance, Stand-pivot, RW Toilet Transfer Details (indicate cue type and reason): simulated from EOB to recliner toward right Toileting- Clothing Manipulation and Hygiene: Moderate assistance, Sitting/lateral lean Functional mobility during ADLs: Moderate assistance, Rolling walker General ADL Comments: pt limited by instability, decreased activity tolerance, new LLE BKA  Cognition: Cognition Overall Cognitive Status: Within Functional Limits for tasks assessed Orientation Level: Oriented X4 Cognition Arousal/Alertness: Awake/alert Behavior During Therapy: Flat affect, Anxious Overall Cognitive Status: Within Functional Limits for tasks assessed General Comments: increasing anxiety with mobility limiting progression, pt repeatedly saying he is sorry that he is not doing better, reassured pt that progression is not linear and some days are better than others.   Blood pressure 100/61, pulse 93, temperature 98 F (36.7 C), temperature source Oral, resp. rate 20, height '5\' 11"'  (1.803 m), weight  89.9 kg, SpO2 93 %. Physical Exam Vitals and nursing note reviewed.  Constitutional:      Appearance: Normal appearance. He is normal weight.     Comments: Pt awake, alert, appropriate, sitting up in bedside chair, watching TV, NAD  HENT:     Head: Normocephalic and atraumatic.     Right Ear: External ear normal.     Left Ear: External ear normal.     Nose: Nose normal. No congestion or rhinorrhea.     Mouth/Throat:     Mouth: Mucous membranes are moist.     Pharynx: Oropharynx is clear. No oropharyngeal exudate.  Eyes:     General:        Right eye: No discharge.        Left eye: No discharge.     Extraocular Movements: Extraocular movements intact.  Cardiovascular:     Comments: Quiet heart sounds- but sounds RRR- no JVD seen Pulmonary:     Comments: CTA B/L- no W/R/R- good air movement Abdominal:     Comments: Soft, NT, ND, (+)BS - hypoactive  Musculoskeletal:     Cervical back:  Normal range of motion and neck supple.     Comments: Right heel boggy and red.  R foot- Chrcot food- has no arch- no actually skin broken down- scabs R calf scattered  L BKA with VAC in place UE 5/5 B/L in biceps, triceps, WE< grip and FA  Les- RLE- HF 4+/5, KF/KE 4/5, DF and PF 4+/5 LLE- HF and KE at least 3+/5- painful to do ROM Shaping OK- has shrinker over VAC- bulbous at end  Skin:    Comments: IV R forearm LUE fistula- can feel thrill Scattered scabs on R calf/shin Fallen arch on R foot/charcot foot R heel boggy  Neurological:     Mental Status: He is alert and oriented to person, place, and time.     Comments: Decreased to absent Sensation to light touch from R knee downwards   Psychiatric:        Mood and Affect: Mood normal.        Behavior: Behavior normal.    Results for orders placed or performed during the hospital encounter of 05/07/21 (from the past 48 hour(s))  Glucose, capillary     Status: Abnormal   Collection Time: 05/16/21 12:00 PM  Result Value Ref Range   Glucose-Capillary 139 (H) 70 - 99 mg/dL    Comment: Glucose reference range applies only to samples taken after fasting for at least 8 hours.  Glucose, capillary     Status: Abnormal   Collection Time: 05/16/21  4:55 PM  Result Value Ref Range   Glucose-Capillary 151 (H) 70 - 99 mg/dL    Comment: Glucose reference range applies only to samples taken after fasting for at least 8 hours.  Glucose, capillary     Status: Abnormal   Collection Time: 05/16/21  9:13 PM  Result Value Ref Range   Glucose-Capillary 207 (H) 70 - 99 mg/dL    Comment: Glucose reference range applies only to samples taken after fasting for at least 8 hours.  CBC     Status: Abnormal   Collection Time: 05/17/21  3:09 AM  Result Value Ref Range   WBC 10.2 4.0 - 10.5 K/uL   RBC 2.65 (L) 4.22 - 5.81 MIL/uL   Hemoglobin 8.2 (L) 13.0 - 17.0 g/dL   HCT 26.6 (L) 39.0 - 52.0 %   MCV 100.4 (H) 80.0 - 100.0 fL  MCH 30.9 26.0 - 34.0 pg    MCHC 30.8 30.0 - 36.0 g/dL   RDW 17.0 (H) 11.5 - 15.5 %   Platelets 192 150 - 400 K/uL   nRBC 0.0 0.0 - 0.2 %    Comment: Performed at [facility], [place] [address] [place], [place] [address]  Renal function panel     Status: Abnormal   Collection Time: 05/17/21  7:28 AM  Result Value Ref Range   Sodium 133 (L) 135 - 145 mmol/L   Potassium 4.4 3.5 - 5.1 mmol/L   Chloride 98 98 - 111 mmol/L   CO2 28 22 - 32 mmol/L   Glucose, Bld 169 (H) 70 - 99 mg/dL    Comment: Glucose reference range applies only to samples taken after fasting for at least 8 hours.   BUN 62 (H) 8 - 23 mg/dL   Creatinine, Ser 5.80 (H) 0.61 - 1.24 mg/dL   Calcium 9.0 8.9 - 10.3 mg/dL   Phosphorus 2.8 2.5 - 4.6 mg/dL   Albumin 1.8 (L) 3.5 - 5.0 g/dL   GFR, Estimated 10 (L) >60 mL/min    Comment: (NOTE) Calculated using the CKD-EPI Creatinine Equation (2021)    Anion gap 7 5 - 15    Comment: Performed at [facility] [address] [place], [place] [address]  Glucose, capillary     Status: Abnormal   Collection Time: 05/17/21 12:33 PM  Result Value Ref Range   Glucose-Capillary 106 (H) 70 - 99 mg/dL    Comment: Glucose reference range applies only to samples taken after fasting for at least 8 hours.  Glucose, capillary     Status: Abnormal   Collection Time: 05/17/21  4:48 PM  Result Value Ref Range   Glucose-Capillary 168 (H) 70 - 99 mg/dL    Comment: Glucose reference range applies only to samples taken after fasting for at least 8 hours.  Glucose, capillary     Status: Abnormal   Collection Time: 05/17/21  9:06 PM  Result Value Ref Range   Glucose-Capillary 176 (H) 70 - 99 mg/dL    Comment: Glucose reference range applies only to samples taken after fasting for at least 8 hours.  Glucose, capillary     Status: Abnormal   Collection Time: 05/18/21  6:44 AM  Result Value Ref Range   Glucose-Capillary 127 (H) 70 - 99 mg/dL    Comment: Glucose reference range applies only to samples taken  after fasting for at least 8 hours.  Glucose, capillary     Status: Abnormal   Collection Time: 05/18/21 11:23 AM  Result Value Ref Range   Glucose-Capillary 189 (H) 70 - 99 mg/dL    Comment: Glucose reference range applies only to samples taken after fasting for at least 8 hours.   No results found.     Medical Problem List and Plan: 1.  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  2.  Antithrombotics: -DVT/anticoagulation:  Pharmaceutical: Heparin  -antiplatelet therapy: ASA/Plavix 3. Pain Management: Change dilaudid to oxycodone prn.   --resume gabapentin to help manage phantom pain.  4. Mood: LCSW to follow for evaluation and support.    -antipsychotic agents: N/A 5. Neuropsych: This patient is capable of making decisions on his own behalf. 6. Skin/Wound Care: Continue wound VAC  --On  Zinc and Juven to help promote wound healing.  7. Fluids/Electrolytes/Nutrition: Strict I/O with daily weights.  8. R -charcot foot: Encourage wearing CAM boot with activity. 9. T2DM: Hgb A1C-8.2. Monitor BS ac/hs  --continue Lantus 5 units with SSI for elevated BS 10. ESRD: HD MWF at the end of the day to facilitate therapy and endurance issues  --No plans to resume PD. Midodrine for BP support in hemo.  11. CAD w/recent NSTEMI: Treated medically w/ASA, Plavix and Lipitor.   12. Acute on chronic anemia: Loaded with IV ferric gluconate --continue [name] 150 mc weekly with serial CBC to monitor for stability .  13. PAD: On trental, plavix and ASA.  14. Chronic constipation: Continue Sorbitol bid.  15. Hyperparathyroidism: Being managed with aruxia and Calcitriol.  16. Insomnia/RLS: Resume gabapentin for phantom pain as well as RLS.  --Klonopin prn for sleep 17 H/o  Depression/anxiety: Continue Lexapro (started a few weeks ago) and klonopin.        Bary Leriche, PA-C 05/18/2021   I have personally performed a face to face diagnostic evaluation of this patient and formulated the key components of the plan.  Additionally, I have personally reviewed laboratory data, imaging studies, as well as relevant notes and concur with the physician assistant's documentation above.   The patient's status has not changed from the original H&P.  Any changes in documentation from the acute care chart have been noted above.

## 2021-05-18 NOTE — Progress Notes (Signed)
Report given to 97M RN. Patient is alert and oriented x4 . All meds given for tonight.

## 2021-05-19 DIAGNOSIS — S88112A Complete traumatic amputation at level between knee and ankle, left lower leg, initial encounter: Secondary | ICD-10-CM | POA: Diagnosis not present

## 2021-05-19 LAB — GLUCOSE, CAPILLARY
Glucose-Capillary: 156 mg/dL — ABNORMAL HIGH (ref 70–99)
Glucose-Capillary: 125 mg/dL — ABNORMAL HIGH (ref 70–99)
Glucose-Capillary: 152 mg/dL — ABNORMAL HIGH (ref 70–99)
Glucose-Capillary: 187 mg/dL — ABNORMAL HIGH (ref 70–99)

## 2021-05-19 LAB — CBC
HCT: 25.9 % — ABNORMAL LOW (ref 39.0–52.0)
Hemoglobin: 8 g/dL — ABNORMAL LOW (ref 13.0–17.0)
MCH: 31.4 pg (ref 26.0–34.0)
MCHC: 30.9 g/dL (ref 30.0–36.0)
MCV: 101.6 fL — ABNORMAL HIGH (ref 80.0–100.0)
Platelets: 189 10*3/uL (ref 150–400)
RBC: 2.55 MIL/uL — ABNORMAL LOW (ref 4.22–5.81)
RDW: 17.2 % — ABNORMAL HIGH (ref 11.5–15.5)
WBC: 8.2 10*3/uL (ref 4.0–10.5)
nRBC: 0 % (ref 0.0–0.2)

## 2021-05-19 LAB — COMPREHENSIVE METABOLIC PANEL
ALT: 19 U/L (ref 0–44)
AST: 47 U/L — ABNORMAL HIGH (ref 15–41)
Albumin: 1.9 g/dL — ABNORMAL LOW (ref 3.5–5.0)
Alkaline Phosphatase: 174 U/L — ABNORMAL HIGH (ref 38–126)
Anion gap: 6 (ref 5–15)
BUN: 56 mg/dL — ABNORMAL HIGH (ref 8–23)
CO2: 30 mmol/L (ref 22–32)
Calcium: 8.8 mg/dL — ABNORMAL LOW (ref 8.9–10.3)
Chloride: 95 mmol/L — ABNORMAL LOW (ref 98–111)
Creatinine, Ser: 5.24 mg/dL — ABNORMAL HIGH (ref 0.61–1.24)
GFR, Estimated: 12 mL/min — ABNORMAL LOW (ref >60)
Glucose, Bld: 165 mg/dL — ABNORMAL HIGH (ref 70–99)
Potassium: 4.4 mmol/L (ref 3.5–5.1)
Sodium: 131 mmol/L — ABNORMAL LOW (ref 135–145)
Total Bilirubin: 0.5 mg/dL (ref 0.3–1.2)
Total Protein: 5.2 g/dL — ABNORMAL LOW (ref 6.5–8.1)

## 2021-05-19 MED ORDER — NEPRO/CARBSTEADY PO LIQD
237.0000 mL | Freq: Every day | ORAL | Status: DC
  Administered 2021-05-19 – 2021-06-06 (×13): 237 mL via ORAL

## 2021-05-19 MED ORDER — ALBUMIN HUMAN 25 % IV SOLN
25.0000 g | Freq: Once | INTRAVENOUS | Status: AC
  Administered 2021-05-19: 25 g via INTRAVENOUS

## 2021-05-19 MED ORDER — MIDODRINE HCL 5 MG PO TABS
5.0000 mg | ORAL_TABLET | Freq: Once | ORAL | Status: AC
  Administered 2021-05-19: 5 mg via ORAL
  Filled 2021-05-19: qty 1

## 2021-05-19 MED ORDER — MIDODRINE HCL 5 MG PO TABS
ORAL_TABLET | ORAL | Status: AC
  Filled 2021-05-19: qty 2

## 2021-05-19 MED ORDER — MIDODRINE HCL 5 MG PO TABS: 5.0000 mg | ORAL_TABLET | ORAL | Status: DC

## 2021-05-19 MED ORDER — MIDODRINE HCL 5 MG PO TABS
10.0000 mg | ORAL_TABLET | Freq: Once | ORAL | Status: AC
  Administered 2021-05-19: 10 mg via ORAL

## 2021-05-19 MED ORDER — RENA-VITE PO TABS
1.0000 | ORAL_TABLET | Freq: Every day | ORAL | Status: DC
  Administered 2021-05-19 – 2021-06-07 (×20): 1 via ORAL
  Filled 2021-05-19 (×20): qty 1

## 2021-05-19 NOTE — Progress Notes (Signed)
Pt. Transported  by bed to the unit at 2130. H2T assessment performed.  Pt. A/O X4.  Denied pain at this time. No SOB noted. Lung sounds clear to auscultation in upper/lower lobes with no adventitious sound. Pulses assessed; R/L Radial +2, R/L Femoral +2/ r/Dorsalis pedis +2. Pt. Skin is warm to touch, intact with Ecchymosis to bilateral arms, HD cath on L. Upper forearm. Old HD access on R. Lower abdomen, PIV on right forearm. Pt. Has  left BKA with a wound vac set at 125 mm Hg. LLE/RLE +1 edema. V/S WNL. Will continue to monitor pt. For acute changes.

## 2021-05-19 NOTE — Care Management (Signed)
Inpatient Rehabilitation Center Individual Statement of Services  Patient Name:  Maxwell Harmon  Date:  05/19/2021  Welcome to the [facility].  Our goal is to provide you with an individualized program based on your diagnosis and situation, designed to meet your specific needs.  With this comprehensive rehabilitation program, you will be expected to participate in at least 3 hours of rehabilitation therapies Monday-Friday, with modified therapy programming on the weekends.  Your rehabilitation program will include the following services:  Physical Therapy (PT), Occupational Therapy (OT), 24 hour per day rehabilitation nursing, Therapeutic Recreaction (TR), Psychology, Neuropsychology, Care Coordinator, Rehabilitation Medicine, [place], and Other  Weekly team conferences will be held on Tuesdays to discuss your progress.  Your Inpatient Rehabilitation Care Coordinator will talk with you frequently to get your input and to update you on team discussions.  Team conferences with you and your family in attendance may also be held.  Expected length of stay: 12-14 days    Overall anticipated outcome: Supervision  Depending on your progress and recovery, your program may change. Your Inpatient Rehabilitation Care Coordinator will coordinate services and will keep you informed of any changes. Your Inpatient Rehabilitation Care Coordinator's name and contact numbers are listed  below.  The following services may also be recommended but are not provided by the [facility] will be made to provide these services after discharge if needed.  Arrangements include referral to agencies that provide these services.  Your insurance has been verified to be:  [place]  Your primary doctor is:  [name]  Pertinent information will be shared with your doctor and your insurance company.  Inpatient Rehabilitation Care Coordinator:  Cathleen Corti [phone] or [phone]  Information discussed with and copy given to patient by: Rana Snare, 05/19/2021, 2:21 PM

## 2021-05-19 NOTE — H&P (Signed)
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 Evlyn Courier) with history of HTN, T2DM, ESRD- HD MWF, CAD s/p CABG 2021 and NSTEMI 04/2021 with cath 04/2021 revealing no options for revascularization, severe PAD s/p angioplasty with chronic left foot ulcer who was readmitted on 05/07/21 with increase in foot pain, edema and increase in drainage. He was started on broad spectrum antibiotics and Dr.Fields/Hawken consulted in attempts for limb salvage. Due to rapid deterioration with concerns of osteomyelitis, BKA recommended and patient underwent said procedure by Dr. Sharol Given on 07/08. He was transfused with one unit PRBC pre-op and continues on HD with no plans for resumption of PD--catheter to be removed on outpatient basis. He has had issues with SOB/DOE with hypotension on 07/11 felt to be due to fluid overload and EDW challenged with addition of midodrine for BP support on HD days. 2D echo done showing EF 25-30% and Dr. Haroldine Laws consulted for input. He recommended optimization of fluid status per HD which has improved patient's symptoms. Therapy ongoing and patient continues to be limited by weakness, fatigue, right charcot foot and poor standing balance secondary to BKA. [name] recommended due to functional decline.      Pt reports pain "not bad"- mainly having residual limb pain, however occ phantom pain. LBM x2 yesterday Oliguric- does form urine! Required an in/out cath yesterday x1. Usually voids 1-2x/day.      Review of Systems Constitutional:  Negative for chills and fever. HENT:  Negative for hearing loss.   Eyes:  Negative for blurred vision and double vision. Respiratory:  Positive for cough. Negative for shortness of breath.   Cardiovascular:  Negative for chest pain, palpitations and leg swelling. Gastrointestinal:  Negative  for constipation, heartburn and nausea. Genitourinary:  Negative for dysuria and urgency. Musculoskeletal:  Positive for myalgias (buttock pain.). Skin:  Negative for itching. Neurological:  Positive for sensory change and weakness. Negative for dizziness, tingling, tremors and headaches. Psychiatric/Behavioral:  Negative for depression. The patient has insomnia. The patient is not nervous/anxious.   All other systems reviewed and are negative.         Past Medical History:  Diagnosis Date   Diabetes mellitus without complication ([place])     History of bleeding ulcers     History of TIAs     Hypertension     Low kidney function             Past Surgical History:  Procedure Laterality Date   ABDOMINAL AORTOGRAM W/LOWER EXTREMITY Bilateral 04/14/2021    Procedure: ABDOMINAL AORTOGRAM W/LOWER EXTREMITY;  Surgeon: Elam Dutch, MD;  Location: [facility];  Service: Vascular;  Laterality: Bilateral;   AMPUTATION Left 05/12/2021    Procedure: LEFT BELOW KNEE AMPUTATION;  Surgeon: Newt Minion, MD;  Location: [facility];  Service: Orthopedics;  Laterality: Left;   CATARACT EXTRACTION W/PHACO Left 02/15/2014    Procedure: CATARACT EXTRACTION PHACO AND INTRAOCULAR LENS PLACEMENT (IOC);  Surgeon: Tonny Branch, MD;  Location: [facility];  Service: Ophthalmology;  Laterality: Left;  CDE 10.84   CATARACT EXTRACTION W/PHACO Right 12/14/2013    Procedure: CATARACT EXTRACTION PHACO AND INTRAOCULAR LENS PLACEMENT (IOC);  Surgeon: Tonny Branch, MD;  Location: [facility];  Service: Ophthalmology;  Laterality: Right;  CDE:  16.30   CHOLECYSTECTOMY   02/2013   EYE SURGERY       IR FLUORO GUIDE CV LINE RIGHT   01/05/2019   IR US GUIDE VASC ACCESS RIGHT   01/05/2019   LAPAROSCOPIC NEPHRECTOMY Right 01/08/2019    Procedure: LAPAROSCOPIC RADICAL NEPHRECTOMY;  Surgeon: Raynelle Bring, MD;  Location: [facility];  Service: Urology;  Laterality: Right;   LEFT HEART CATH AND CORS/GRAFTS ANGIOGRAPHY N/A 04/14/2021    Procedure: LEFT  HEART CATH AND CORS/GRAFTS ANGIOGRAPHY;  Surgeon: Jolaine Artist, MD;  Location: [facility];  Service: Cardiovascular;  Laterality: N/A;   PENILE PROSTHESIS IMPLANT       PERIPHERAL VASCULAR INTERVENTION Left 04/19/2021    Procedure: PERIPHERAL VASCULAR INTERVENTION;  Surgeon: Cherre Robins, MD;  Location: [facility];  Service: Cardiovascular;  Laterality: Left;   TOE AMPUTATION Right 2013    [facility]         Family History  Problem Relation Age of Onset   Stroke Mother     Stroke Father     Cancer Father     Cancer Brother      Social History:  reports that he has never smoked. He has never used smokeless tobacco. He reports current alcohol use. He reports that he does not use drugs. Allergies: No Known Allergies       Medications Prior to Admission  Medication Sig Dispense Refill   aspirin 81 MG EC tablet Take 1 tablet (81 mg total) by mouth daily. 30 tablet 2   atorvastatin (LIPITOR) 40 MG tablet Take 2 tablets (80 mg total) by mouth daily. 60 tablet 2   AURYXIA 1 GM 210 MG(Fe) tablet Take 630 mg by mouth 3 (three) times daily.       blood glucose meter kit and supplies KIT Dispense based on patient and insurance preference. Use up to four times daily as directed. (FOR ICD-9 250.00, 250.01). 1 each 0   Blood Glucose Monitoring Suppl (BAYER CONTOUR MONITOR) w/Device KIT 1 each by Does not apply route 4 (four) times daily. Test 4 x daily 1 kit 0   calcitRIOL (ROCALTROL) 0.25 MCG capsule Take 0.25 mcg by mouth daily.       cinacalcet (SENSIPAR) 30 MG tablet Take 30 mg by mouth once a week. Take on Mondays       clonazePAM (KLONOPIN) 0.5 MG tablet Take 0.5 mg by mouth daily as needed for anxiety.       clopidogrel (PLAVIX) 75 MG tablet Take 1 tablet by mouth daily.       collagenase (SANTYL) ointment Apply topically daily. 30 g 0   fluorouracil (EFUDEX) 5 % cream Apply topically 2 (two) times daily.       glucose blood (BAYER CONTOUR TEST) test  strip Use as instructed 4 x daily 150 each 5   Insulin Glargine (BASAGLAR KWIKPEN) 100 UNIT/ML Inject 5 Units into the skin at bedtime. 3 mL 1   Insulin Pen Needle (B-D ULTRAFINE III SHORT PEN) 31G X 8 MM MISC 1 each by Does not apply route as directed. 100 each 3   Lancets MISC 1 each by Does not apply route 4 (four) times daily. 150 each 5   mupirocin ointment (BACTROBAN) 2 % Apply topically daily. (Patient taking differently: Apply 1 application topically daily.) 22 g 0   nitroGLYCERIN (NITRODUR - DOSED IN MG/24 HR) 0.2 mg/hr patch Place 1 patch (0.2 mg total) onto the skin daily. 30 patch  12   pentoxifylline (TRENTAL) 400 MG CR tablet Take 1 tablet (400 mg total) by mouth 3 (three) times daily with meals. 90 tablet 3   tamsulosin (FLOMAX) 0.4 MG CAPS capsule Take 0.4 mg by mouth at bedtime.       escitalopram (LEXAPRO) 10 MG tablet Take 1 tablet (10 mg total) by mouth daily. 30 tablet 2      Drug Regimen Review  Drug regimen was reviewed and remains appropriate with no significant issues identified   Home: Home Living Family/patient expects to be discharged to:: Private residence Living Arrangements: Spouse/significant other Available Help at Discharge: Family, Available PRN/intermittently Type of Home: House Home Access: Stairs to enter [employer] of Steps: 2-3 (Pt son is working on building a ramp.) Entrance Stairs-Rails: Right Home Layout: One level Bathroom Shower/Tub: Multimedia programmer: Standard Home Equipment: Civil engineer, contracting, Radio producer - single point, Environmental consultant - 2 wheels Additional Comments: Pt has a wheelchair that was borrowed from friends. He has not needed it for mobility PTA.  Lives With: Spouse   Functional History: Prior Function Level of Independence: Independent with assistive device(s) Comments: amb household distances with RW. Multiple falls.  Spouse assist with community mobility, meals, and home management.   Functional Status:  Mobility: Bed  Mobility Overal bed mobility: Needs Assistance Bed Mobility: Sit to Supine Supine to sit: HOB elevated, Min guard Sit to supine: Mod assist General bed mobility comments: min guard for safety, pt requires increased time and effort, as well as vc for sequencing Transfers Overall transfer level: Needs assistance Equipment used: Rolling walker (2 wheeled) Transfers: Sit to/from Stand, Stand Pivot Transfers Sit to Stand: Min assist, Mod assist Stand pivot transfers: Mod assist Squat pivot transfers: Mod assist General transfer comment: min A to come to upright however unable to manage RW for pivot to chair, min A for stand pivot transfer to chair with belt assist, vc for coming up on ball of foot for pivot point, practiced sit>stand to RW able to achieve with mod A however increased time to achieve, vc for not overthinking, tried again with RW and unable to extend R knee to achieve upright, modA for coming to upright with face to face belt assit, continues to have difficulty extending knee, pt admits that he is very fearful about falling Ambulation/Gait Ambulation/Gait assistance: Min assist, Mod assist Gait Distance (Feet): 1 Feet Assistive device: Rolling walker (2 wheeled) Gait Pattern/deviations:  (2 hops forward and attempt to hop back unable, attempted again however not able to clear foot) General Gait Details: unable   ADL: ADL Overall ADL's : Needs assistance/impaired Eating/Feeding: Independent, Sitting Eating/Feeding Details (indicate cue type and reason): pt finished eating breakfast upon arrival Grooming: Set up, Sitting Grooming Details (indicate cue type and reason): at sink level while seated in the recliner Upper Body Bathing: Set up, Sitting Lower Body Bathing: Moderate assistance, Sitting/lateral leans Upper Body Dressing : Set up, Sitting Lower Body Dressing: Minimal assistance Lower Body Dressing Details (indicate cue type and reason): minA to don limb guard while long  sitting in bed Toilet Transfer: Moderate assistance, Stand-pivot, RW Toilet Transfer Details (indicate cue type and reason): simulated from EOB to recliner toward right Toileting- Clothing Manipulation and Hygiene: Moderate assistance, Sitting/lateral lean Functional mobility during ADLs: Moderate assistance, Rolling walker General ADL Comments: pt limited by instability, decreased activity tolerance, new LLE BKA   Cognition: Cognition Overall Cognitive Status: Within Functional Limits for tasks assessed Orientation Level: Oriented X4 Cognition Arousal/Alertness: Awake/alert Behavior During  Therapy: Flat affect, Anxious Overall Cognitive Status: Within Functional Limits for tasks assessed General Comments: increasing anxiety with mobility limiting progression, pt repeatedly saying he is sorry that he is not doing better, reassured pt that progression is not linear and some days are better than others.     Blood pressure 100/61, pulse 93, temperature 98 F (36.7 C), temperature source Oral, resp. rate 20, height $RemoveBe'5\' 11"'[id number]$  (1.803 m), weight 89.9 kg, SpO2 93 %. Physical Exam Vitals and nursing note reviewed. Constitutional:      Appearance: Normal appearance. He is normal weight.    Comments: Pt awake, alert, appropriate, sitting up in bedside chair, watching TV, NAD  HENT:    Head: Normocephalic and atraumatic.    Right Ear: External ear normal.    Left Ear: External ear normal.    Nose: Nose normal. No congestion or rhinorrhea.    Mouth/Throat:    Mouth: Mucous membranes are moist.    Pharynx: Oropharynx is clear. No oropharyngeal exudate. Eyes:    General:        Right eye: No discharge.        Left eye: No discharge.    Extraocular Movements: Extraocular movements intact. Cardiovascular:    Comments: Quiet heart sounds- but sounds RRR- no JVD seen Pulmonary:    Comments: CTA B/L- no W/R/R- good air movement Abdominal:    Comments: Soft, NT, ND, (+)BS - hypoactive   Musculoskeletal:    Cervical back: Normal range of motion and neck supple.    Comments: Right heel boggy and red. R foot- Chrcot food- has no arch- no actually skin broken down- scabs R calf scattered   L BKA with VAC in place UE 5/5 B/L in biceps, triceps, WE< grip and FA Les- RLE- HF 4+/5, KF/KE 4/5, DF and PF 4+/5 LLE- HF and KE at least 3+/5- painful to do ROM Shaping OK- has shrinker over VAC- bulbous at end  Skin:    Comments: IV R forearm LUE fistula- can feel thrill Scattered scabs on R calf/shin Fallen arch on R foot/charcot foot R heel boggy  Neurological:    Mental Status: He is alert and oriented to person, place, and time.    Comments: Decreased to absent Sensation to light touch from R knee downwards   Psychiatric:        Mood and Affect: Mood normal.        Behavior: Behavior normal.     Lab Results Last 48 Hours        Results for orders placed or performed during the hospital encounter of 05/07/21 (from the past 48 hour(s))  Glucose, capillary     Status: Abnormal    Collection Time: 05/16/21 12:00 PM  Result Value Ref Range    Glucose-Capillary 139 (H) 70 - 99 mg/dL      Comment: Glucose reference range applies only to samples taken after fasting for at least 8 hours.  Glucose, capillary     Status: Abnormal    Collection Time: 05/16/21  4:55 PM  Result Value Ref Range    Glucose-Capillary 151 (H) 70 - 99 mg/dL      Comment: Glucose reference range applies only to samples taken after fasting for at least 8 hours.  Glucose, capillary     Status: Abnormal    Collection Time: 05/16/21  9:13 PM  Result Value Ref Range    Glucose-Capillary 207 (H) 70 - 99 mg/dL      Comment: Glucose reference range applies only  to samples taken after fasting for at least 8 hours.  CBC     Status: Abnormal    Collection Time: 05/17/21  3:09 AM  Result Value Ref Range    WBC 10.2 4.0 - 10.5 K/uL    RBC 2.65 (L) 4.22 - 5.81 MIL/uL    Hemoglobin 8.2 (L) 13.0 - 17.0 g/dL     HCT 26.6 (L) 39.0 - 52.0 %    MCV 100.4 (H) 80.0 - 100.0 fL    MCH 30.9 26.0 - 34.0 pg    MCHC 30.8 30.0 - 36.0 g/dL    RDW 17.0 (H) 11.5 - 15.5 %    Platelets 192 150 - 400 K/uL    nRBC 0.0 0.0 - 0.2 %      Comment: Performed at [facility] [address], [place], [place] [id number]  Renal function panel     Status: Abnormal    Collection Time: 05/17/21  7:28 AM  Result Value Ref Range    Sodium 133 (L) 135 - 145 mmol/L    Potassium 4.4 3.5 - 5.1 mmol/L    Chloride 98 98 - 111 mmol/L    CO2 28 22 - 32 mmol/L    Glucose, Bld 169 (H) 70 - 99 mg/dL      Comment: Glucose reference range applies only to samples taken after fasting for at least 8 hours.    BUN 62 (H) 8 - 23 mg/dL    Creatinine, Ser 5.80 (H) 0.61 - 1.24 mg/dL    Calcium 9.0 8.9 - 10.3 mg/dL    Phosphorus 2.8 2.5 - 4.6 mg/dL    Albumin 1.8 (L) 3.5 - 5.0 g/dL    GFR, Estimated 10 (L) >60 mL/min      Comment: (NOTE) Calculated using the CKD-EPI Creatinine Equation (2021)      Anion gap 7 5 - 15      Comment: Performed at [facility] [address] [place], [place] [address]  Glucose, capillary     Status: Abnormal    Collection Time: 05/17/21 12:33 PM  Result Value Ref Range    Glucose-Capillary 106 (H) 70 - 99 mg/dL      Comment: Glucose reference range applies only to samples taken after fasting for at least 8 hours.  Glucose, capillary     Status: Abnormal    Collection Time: 05/17/21  4:48 PM  Result Value Ref Range    Glucose-Capillary 168 (H) 70 - 99 mg/dL      Comment: Glucose reference range applies only to samples taken after fasting for at least 8 hours.  Glucose, capillary     Status: Abnormal    Collection Time: 05/17/21  9:06 PM  Result Value Ref Range    Glucose-Capillary 176 (H) 70 - 99 mg/dL      Comment: Glucose reference range applies only to samples taken after fasting for at least 8 hours.  Glucose, capillary     Status: Abnormal    Collection Time: 05/18/21  6:44 AM  Result  Value Ref Range    Glucose-Capillary 127 (H) 70 - 99 mg/dL      Comment: Glucose reference range applies only to samples taken after fasting for at least 8 hours.  Glucose, capillary     Status: Abnormal    Collection Time: 05/18/21 11:23 AM  Result Value Ref Range    Glucose-Capillary 189 (H) 70 - 99 mg/dL      Comment: Glucose reference range applies  only to samples taken after fasting for at least 8 hours.      Imaging Results (Last 48 hours)  No results found.           Medical Problem List and Plan: 1.  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 2.  Antithrombotics: -DVT/anticoagulation:  Pharmaceutical: Heparin             -antiplatelet therapy: ASA/Plavix 3. Pain Management: Change dilaudid to oxycodone prn.             --resume gabapentin to help manage phantom pain. 4. Mood: LCSW to follow for evaluation and support.               -antipsychotic agents: N/A 5. Neuropsych: This patient is capable of making decisions on his own behalf. 6. Skin/Wound Care: Continue wound VAC             --On Zinc and Juven to help promote wound healing. 7. Fluids/Electrolytes/Nutrition: Strict I/O with daily weights. 8. R -charcot foot: Encourage wearing CAM boot with activity. 9. T2DM: Hgb A1C-8.2. Monitor BS ac/hs             --continue Lantus 5 units with SSI for elevated BS 10. ESRD: HD MWF at the end of the day to facilitate therapy and endurance issues             --No plans to resume PD. Midodrine for BP support in hemo. 11. CAD w/recent NSTEMI: Treated medically w/ASA, Plavix and Lipitor.   12. Acute on chronic anemia: Loaded with IV ferric gluconate --continue [name] 150 mc weekly with serial CBC to monitor for stability . 13. PAD: On trental, plavix and ASA. 14. Chronic constipation: Continue  Sorbitol bid. 15. Hyperparathyroidism: Being managed with aruxia and Calcitriol.  16. Insomnia/RLS: Resume gabapentin for phantom pain as well as RLS.             --Klonopin prn for sleep 17 H/o  Depression/anxiety: Continue Lexapro (started a few weeks ago) and klonopin.                    Bary Leriche, PA-C 05/18/2021    I have personally performed a face to face diagnostic evaluation of this patient and formulated the key components of the plan.  Additionally, I have personally reviewed laboratory data, imaging studies, as well as relevant notes and concur with the physician assistant's documentation above.   The patient's status has not changed from the original H&P.  Any changes in documentation from the acute care chart have been noted above.

## 2021-05-19 NOTE — Evaluation (Signed)
Physical Therapy Assessment and Plan  Patient Details  Name: Maxwell Harmon MRN: [id number] Date of Birth: 01-01-1956  PT Diagnosis: Abnormality of gait, Difficulty walking, and Muscle weakness Rehab Potential: Good ELOS: 12-14 days   Today's Date: 05/19/2021 PT Individual Time: 1000-1103 PT Individual Time Calculation (min): 63 min    Hospital Problem: Principal Problem:   Below-knee amputation of left lower extremity ([place]) Active Problems:   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[place])   ESRD on hemodialysis ([place])   Acute on chronic systolic (congestive) heart failure ([place])   Past Medical History:  Past Medical History:  Diagnosis Date   Anemia of chronic disease    CAD (coronary artery disease)    Diabetes mellitus without complication ([employer])    ESRD (end stage renal disease) on dialysis ([place])    MWF in Spring   History of bleeding ulcers    History of TIAs    Hypertension    NSTEMI (non-ST elevated myocardial infarction) ([place]) 04/2021   PAD (peripheral artery disease) ([place])    Past Surgical History:  Past Surgical History:  Procedure Laterality Date   ABDOMINAL AORTOGRAM W/LOWER EXTREMITY Bilateral 04/14/2021   Procedure: ABDOMINAL AORTOGRAM W/LOWER EXTREMITY;  Surgeon: Elam Dutch, MD;  Location: [facility];  Service: Vascular;  Laterality: Bilateral;   AMPUTATION Left 05/12/2021   Procedure: LEFT BELOW KNEE AMPUTATION;  Surgeon: Newt Minion, MD;  Location: [facility];  Service: Orthopedics;  Laterality: Left;   CATARACT EXTRACTION W/PHACO Left 02/15/2014   Procedure: CATARACT EXTRACTION PHACO AND INTRAOCULAR LENS PLACEMENT (IOC);  Surgeon: Tonny Branch, MD;  Location: [facility];  Service: Ophthalmology;  Laterality: Left;  CDE 10.84   CATARACT EXTRACTION W/PHACO Right 12/14/2013   Procedure: CATARACT EXTRACTION PHACO AND INTRAOCULAR LENS PLACEMENT (IOC);  Surgeon: Tonny Branch, MD;  Location: [facility];  Service: Ophthalmology;   Laterality: Right;  CDE:  16.30   CHOLECYSTECTOMY  02/2013   EYE SURGERY     IR FLUORO GUIDE CV LINE RIGHT  01/05/2019   IR US GUIDE VASC ACCESS RIGHT  01/05/2019   LAPAROSCOPIC NEPHRECTOMY Right 01/08/2019   Procedure: LAPAROSCOPIC RADICAL NEPHRECTOMY;  Surgeon: Raynelle Bring, MD;  Location: [facility];  Service: Urology;  Laterality: Right;   LEFT HEART CATH AND CORS/GRAFTS ANGIOGRAPHY N/A 04/14/2021   Procedure: LEFT HEART CATH AND CORS/GRAFTS ANGIOGRAPHY;  Surgeon: Jolaine Artist, MD;  Location: [facility];  Service: Cardiovascular;  Laterality: N/A;   PENILE PROSTHESIS IMPLANT     PERIPHERAL VASCULAR INTERVENTION Left 04/19/2021   Procedure: PERIPHERAL VASCULAR INTERVENTION;  Surgeon: Cherre Robins, MD;  Location: [facility];  Service: Cardiovascular;  Laterality: Left;   TOE AMPUTATION Right 2013   [facility]    Assessment & Plan Clinical Impression: Maxwell Harmon is a 65 year old male (father in law of Dr. Evlyn Courier) with history of HTN, T2DM, ESRD- HD MWF, CAD s/p CABG 2021 and NSTEMI 04/2021 with cath 04/2021 revealing no options for revascularization, severe PAD s/p angioplasty with chronic left foot ulcer who was readmitted on 05/07/21 with increase in foot pain, edema and increase in drainage. He was started on broad spectrum antibiotics and Dr.Fields/Hawken consulted in attempts for limb salvage. Due to rapid deterioration with concerns of osteomyelitis, BKA recommended and patient underwent said procedure by Dr. Sharol Given on 07/08. He was transfused with one unit PRBC pre-op and continues on HD with no plans for resumption  of PD--catheter to be removed on outpatient basis. He has had issues with SOB/DOE with hypotension on 07/11 felt to be due to fluid overload and EDW challenged with addition of midodrine for BP support on HD days. 2D echo done showing EF 25-30% and Dr. Haroldine Laws consulted for input. He recommended optimization of fluid status per HD which has  improved patient's symptoms. Therapy ongoing and patient continues to be limited by weakness, fatigue, right charcot foot and poor standing balance secondary to BKA. Patient transferred to [name] on 05/18/2021 .   Patient currently requires mod with mobility secondary to muscle weakness, decreased cardiorespiratoy endurance, and decreased sitting balance.  Prior to hospitalization, patient was modified independent  with mobility and lived with Spouse in a House home.  Home access is 2-3 (Pt son is working on building a ramp.)Stairs to enter.  Patient will benefit from skilled PT intervention to maximize safe functional mobility, minimize fall risk, and decrease caregiver burden for planned discharge home with intermittent assist.  Anticipate patient will benefit from follow up [facility] at discharge.  PT - End of Session Activity Tolerance: Tolerates 10 - 20 min activity with multiple rests Endurance Deficit: Yes Endurance Deficit Description: pt requires multiple rest breaks during eval, becomes SOB quickly PT Assessment Rehab Potential (ACUTE/IP ONLY): Good PT Barriers to Discharge: [place] home environment;Hemodialysis;Weight bearing restrictions PT Barriers to Discharge Comments: comorbid R charcot foot PT Patient demonstrates impairments in the following area(s): Balance;Edema;Endurance;Motor;Pain;Skin Integrity;Sensory;Safety PT Transfers Functional Problem(s): Bed Mobility;Bed to Chair;Car;Furniture;Floor PT Locomotion Functional Problem(s): Ambulation;Wheelchair Mobility;Stairs PT Plan PT Intensity: Minimum of 1-2 x/day ,45 to 90 minutes PT Frequency: 5 out of 7 days PT Duration Estimated Length of Stay: 12-14 days PT Treatment/Interventions: Ambulation/gait training;Discharge planning;DME/adaptive equipment instruction;Functional mobility training;Pain management;Psychosocial support;Therapeutic Activities;UE/LE Strength taining/ROM;Community Chief Financial Officer;Neuromuscular re-education;Patient/family education;Skin care/wound management;Stair training;Therapeutic Exercise;UE/LE Coordination activities;Wheelchair propulsion/positioning PT Transfers Anticipated Outcome(s): supervision PT Locomotion Anticipated Outcome(s): mod I with w/c PT Recommendation Recommendations for Other Services: Therapeutic Recreation consult Therapeutic Recreation Interventions: Pet therapy;Stress management (Pt mentioned emotional ups and downs after amputation) Follow Up Recommendations: Home health PT Patient destination: Home Equipment Recommended: Wheelchair (measurements);Wheelchair cushion (measurements);Rolling walker with 5" wheels;Sliding board Equipment Details: 18x18 w/c, L amputee pad   PT Evaluation Precautions/Restrictions Precautions Precautions: Fall Precaution Comments: charcot foot, wound VAC, LLE NWB Required Braces or Orthoses: Other Brace Other Brace: Limb Guard Restrictions Weight Bearing Restrictions: Yes LLE Weight Bearing: Non weight bearing Other Position/Activity Restrictions: limb protector for mobility General   Vital Signs  Pain Pain Assessment Pain Scale: 0-10 Pain Score: 0-No pain Home Living/Prior Functioning Home Living Available Help at Discharge: Family;Available PRN/intermittently (wife  is self employed, could be available at d/c PRN) Type of Home: House Home Access: Stairs to enter [employer] of Steps: 2-3 (Pt son is working on building a ramp.) Entrance Stairs-Rails: Right Home Layout: One level Bathroom Shower/Tub: Gaffer (has shower chair) Bathroom Toilet: Programmer, systems: Yes  Lives With: Spouse Prior Function Level of Independence: Requires assistive device for independence  Able to Take Stairs?: Yes Driving: Yes Vocation: Retired Comments: amb household distances with RW. Multiple falls.  Spouse assist with community mobility, meals, and home  management. Vision/Perception  Perception Perception: Within Functional Limits Praxis Praxis: Intact  Cognition Overall Cognitive Status: Within Functional Limits for tasks assessed Arousal/Alertness: Suspect due to medications Orientation Level: Oriented X4 Attention: Focused;Sustained Focused Attention: Appears intact Sustained Attention: Appears intact Memory: Appears intact Immediate Memory Recall: Sock;Blue;Bed Memory Recall Sock: Without Cue  Memory Recall Blue: Without Cue Memory Recall Bed: Without Cue Awareness: Appears intact Problem Solving: Appears intact Behaviors: Other (comment) (flat affect) Safety/Judgment: Appears intact Sensation Sensation Light Touch: Impaired by gross assessment Hot/Cold: Appears Intact Proprioception: Appears Intact Additional Comments: RLE neuropathy, R foot affected Coordination Gross Motor Movements are Fluid and Coordinated: No Coordination and Movement Description: limited by weakness and NWB Motor  Motor Motor: Other (comment) Motor - Skilled Clinical Observations: grossly limited strength and coordination   Trunk/Postural Assessment  Cervical Assessment Cervical Assessment: Within Functional Limits Thoracic Assessment Thoracic Assessment: Within Functional Limits Lumbar Assessment Lumbar Assessment: Within Functional Limits Postural Control Postural Control: Within Functional Limits  Balance Balance Balance Assessed: Yes Static Sitting Balance Static Sitting - Balance Support: Feet supported;Bilateral upper extremity supported Static Sitting - Level of Assistance: 5: Stand by assistance Dynamic Sitting Balance Dynamic Sitting - Balance Support: Bilateral upper extremity supported;During functional activity Dynamic Sitting - Level of Assistance: 4: Min assist Dynamic Sitting - Balance Activities: Reaching for objects;Forward lean/weight shifting Sitting balance - Comments: retro lean when sitting unsupported 2/2  decreased core strength Extremity Assessment    LUE Assessment LUE Assessment: Within Functional Limits RLE Assessment RLE Assessment: Exceptions to [facility] General Strength Comments: Not tested d/t WB STATUS LLE Assessment LLE Assessment: Within Functional Limits General Strength Comments: Grossly  4+/5  Care Tool Care Tool Bed Mobility Roll left and right activity   Roll left and right assist level: Supervision/Verbal cueing    Sit to lying activity   Sit to lying assist level: Moderate Assistance - Patient 50 - 74%    Lying to sitting edge of bed activity   Lying to sitting edge of bed assist level: Contact Guard/Touching assist     Care Tool Transfers Sit to stand transfer   Sit to stand assist level: Moderate Assistance - Patient 50 - 74%    Chair/bed transfer   Chair/bed transfer assist level: Moderate Assistance - Patient 50 - 74% Chair/bed transfer assistive device: Sliding board   Toilet transfer   Assist Level: Moderate Assistance - Patient 50 - 74%    Car transfer Car transfer activity did not occur: Safety/medical concerns (Pt extremely drowsy 2/2 to medication, not safe to attempt)        Care Tool Locomotion Ambulation Ambulation activity did not occur: Safety/medical concerns        Walk 10 feet activity Walk 10 feet activity did not occur: Safety/medical concerns       Walk 50 feet with 2 turns activity Walk 50 feet with 2 turns activity did not occur: Safety/medical concerns      Walk 150 feet activity Walk 150 feet activity did not occur: Safety/medical concerns      Walk 10 feet on uneven surfaces activity Walk 10 feet on uneven surfaces activity did not occur: Safety/medical concerns      Stairs Stair activity did not occur: Safety/medical concerns        Walk up/down 1 step activity Walk up/down 1 step or curb (drop down) activity did not occur: Safety/medical concerns     Walk up/down 4 steps activity did not occuR: Safety/medical  concerns  Walk up/down 4 steps activity      Walk up/down 12 steps activity Walk up/down 12 steps activity did not occur: Safety/medical concerns      Pick up small objects from floor Pick up small object from the floor (from standing position) activity did not occur: Safety/medical concerns      Wheelchair  Will patient use wheelchair at discharge?: Yes Type of Wheelchair: Manual   Wheelchair assist level: Minimal Assistance - Patient > 75% Max wheelchair distance: 50 ft  Wheel 50 feet with 2 turns activity   Assist Level: Supervision/Verbal cueing  Wheel 150 feet activity Wheelchair 150 feet activity did not occur: Safety/medical concerns      Refer to Care Plan for Long Term Goals  SHORT TERM GOAL WEEK 1 PT Short Term Goal 1 (Week 1): Pt will initiate gait training with LRAD PT Short Term Goal 2 (Week 1): Pt will perform lateral transfer with CGA PT Short Term Goal 3 (Week 1): Pt will be mod I with w/c propulsion up to 150 ft  Recommendations for other services: Therapeutic Recreation  Pet therapy and Stress management  Skilled Therapeutic Intervention Mobility Bed Mobility Bed Mobility: Supine to Sit;Sitting - Scoot to Edge of Bed Supine to Sit: Contact Guard/Touching assist Sitting - Scoot to Edge of Bed: Contact Guard/Touching assist Transfers Transfers: Sit to Stand;Lateral/Scoot Transfers Sit to Stand: Moderate Assistance - Patient 50-74% Lateral/Scoot Transfers: Minimal Assistance - Patient > 75% Transfer (Assistive device): Other (Comment) (slideboard) Locomotion  Gait Ambulation: No Gait Gait: No Stairs / Additional Locomotion Stairs: No Architect: Yes Wheelchair Assistance: Chartered loss adjuster: Both upper extremities Wheelchair Parts Management: Needs assistance Distance: 50 ft  Evaluation completed (see details above and below) with education on PT POC and goals and individual treatment  initiated with focus on initiating functional mobility and transfer training. Pt seated in w/c on arrival and agreeable to therapy. Denies pain. Pt reports that he is extremely drowsy d/t medication taken last night. Pt had difficulty staying awake while sitting up in chair during conversation. Pt advised to consult MD following morning. Sit to stand with mod A and RW, attempted stand pivot transfer but was unable to turn with RW. Pt propelled w/c with BUE x 50 ft with supervision and vc's for brake management and navigation. Pt participated in strength and sensation testing as documented above and below. Pt instructed on and performed lateral scoot transfer to mat table with mod A. Mat table>w/c with sliding board with mod A, pt displayed improved safety with sliding board over lateral scoot at this time. Pt propelled w/c with BUE back to room. Pt handed off to OT at end of session.   Discharge Criteria: Patient will be discharged from PT if patient refuses treatment 3 consecutive times without medical reason, if treatment goals not met, if there is a change in medical status, if patient makes no progress towards goals or if patient is discharged from hospital.  The above assessment, treatment plan, treatment alternatives and goals were discussed and mutually agreed upon: by patient  Mickel Fuchs 05/19/2021, 11:55 AM

## 2021-05-19 NOTE — Evaluation (Signed)
Occupational Therapy Assessment and Plan  Patient Details  Name: Maxwell Harmon MRN: [id number] Date of Birth: Jun 09, 1956  OT Diagnosis: muscle weakness (generalized) Rehab Potential: Rehab Potential (ACUTE ONLY): Good ELOS: 2 weeks   Today's Date: 05/19/2021 OT Individual Time: 0800-0905 OT Individual Time Calculation (min): 65 min     Hospital Problem: Principal Problem:   Below-knee amputation of left lower extremity ([employer]) Active Problems:   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[place])   ESRD on hemodialysis ([place])   Acute on chronic systolic (congestive) heart failure (H[facility])   Past Medical History:  Past Medical History:  Diagnosis Date   Anemia of chronic disease    CAD (coronary artery disease)    Diabetes mellitus without complication ([place])    ESRD (end stage renal disease) on dialysis ([place])    MWF in [place]   History of bleeding ulcers    History of TIAs    Hypertension    NSTEMI (non-ST elevated myocardial infarction) ([place]) 04/2021   PAD (peripheral artery disease) ([place])    Past Surgical History:  Past Surgical History:  Procedure Laterality Date   ABDOMINAL AORTOGRAM W/LOWER EXTREMITY Bilateral 04/14/2021   Procedure: ABDOMINAL AORTOGRAM W/LOWER EXTREMITY;  Surgeon: Elam Dutch, MD;  Location: [facility];  Service: Vascular;  Laterality: Bilateral;   AMPUTATION Left 05/12/2021   Procedure: LEFT BELOW KNEE AMPUTATION;  Surgeon: Newt Minion, MD;  Location: [facility];  Service: Orthopedics;  Laterality: Left;   CATARACT EXTRACTION W/PHACO Left 02/15/2014   Procedure: CATARACT EXTRACTION PHACO AND INTRAOCULAR LENS PLACEMENT (IOC);  Surgeon: Tonny Branch, MD;  Location: [facility];  Service: Ophthalmology;  Laterality: Left;  CDE 10.84   CATARACT EXTRACTION W/PHACO Right 12/14/2013   Procedure: CATARACT EXTRACTION PHACO AND INTRAOCULAR LENS PLACEMENT (IOC);  Surgeon: Tonny Branch, MD;  Location: [facility];  Service: Ophthalmology;   Laterality: Right;  CDE:  16.30   CHOLECYSTECTOMY  02/2013   EYE SURGERY     IR FLUORO GUIDE CV LINE RIGHT  01/05/2019   IR US GUIDE VASC ACCESS RIGHT  01/05/2019   LAPAROSCOPIC NEPHRECTOMY Right 01/08/2019   Procedure: LAPAROSCOPIC RADICAL NEPHRECTOMY;  Surgeon: Raynelle Bring, MD;  Location: [facility];  Service: Urology;  Laterality: Right;   LEFT HEART CATH AND CORS/GRAFTS ANGIOGRAPHY N/A 04/14/2021   Procedure: LEFT HEART CATH AND CORS/GRAFTS ANGIOGRAPHY;  Surgeon: Jolaine Artist, MD;  Location: [facility];  Service: Cardiovascular;  Laterality: N/A;   PENILE PROSTHESIS IMPLANT     PERIPHERAL VASCULAR INTERVENTION Left 04/19/2021   Procedure: PERIPHERAL VASCULAR INTERVENTION;  Surgeon: Cherre Robins, MD;  Location: [facility];  Service: Cardiovascular;  Laterality: Left;   TOE AMPUTATION Right 2013   [facility]    Assessment & Plan Clinical Impression: Patient is a 65 y.o. male admitted 7/3 with diagnosis of cellulitis LLE. Pt presented with gangrenous ischemic changes of the left heel ulcer. 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 PMH: CHF, ESRD (switched from PD to HD 04/2021), hypertension, CAD s/p CABG 2021, PAD h/o limb ischemia, Charcot foot, hyperlipidemia, diabetes, TIA  Patient transferred to [facility] on 05/18/2021 .    Patient currently requires mod with basic self-care skills secondary to muscle weakness, decreased cardiorespiratoy endurance, and decreased sitting balance and decreased standing balance.  Prior to hospitalization, patient could complete ADLs and IADLs with modified independent .  Patient will benefit from skilled intervention to decrease level of assist with basic self-care skills  prior to discharge home with care partner.  Anticipate patient will require intermittent supervision and follow up home health.  OT - End of Session Activity Tolerance: Tolerates 30+ min activity with multiple rests Endurance Deficit: Yes Endurance Deficit Description: pt requires  multiple rest breaks during eval OT Assessment Rehab Potential (ACUTE ONLY): Good OT Barriers to Discharge: Inaccessible home environment;Decreased caregiver support;Wound Care;Hemodialysis OT Patient demonstrates impairments in the following area(s): Balance;Pain;Safety;Endurance;Skin Integrity;Motor OT Basic ADL's Functional Problem(s): Bathing;Dressing;Toileting OT Transfers Functional Problem(s): Toilet;Tub/Shower OT Additional Impairment(s): None OT Plan OT Intensity: Minimum of 1-2 x/day, 45 to 90 minutes OT Frequency: 5 out of 7 days OT Duration/Estimated Length of Stay: 2 weeks OT Treatment/Interventions: Balance/vestibular training;Discharge planning;Community reintegration;DME/adaptive equipment instruction;Neuromuscular re-education;Functional mobility training;Psychosocial support;Skin care/wound managment;Therapeutic Activities;UE/LE Strength taining/ROM;Wheelchair propulsion/positioning;UE/LE Coordination activities;Therapeutic Exercise;Self Care/advanced ADL retraining;Patient/family education OT Self Feeding Anticipated Outcome(s): independent OT Basic Self-Care Anticipated Outcome(s): mod I OT Toileting Anticipated Outcome(s): supervision OT Bathroom Transfers Anticipated Outcome(s): supervision OT Recommendation Recommendations for Other Services: Neuropsych consult Patient destination: Home Follow Up Recommendations: Home health OT Equipment Recommended: 3 in 1 bedside comode;To be determined Equipment Details: DA BSC   OT Evaluation Precautions/Restrictions  Precautions Precautions: Fall Precaution Comments: charcot foot, wound VAC Required Braces or Orthoses: Other Brace Other Brace: Limb Guard Restrictions Weight Bearing Restrictions: Yes LLE Weight Bearing: Non weight bearing Other Position/Activity Restrictions: limb protector for mobility General Chart Reviewed: Yes Family/Caregiver Present: No Vital Signs   Pain Pain Assessment Pain Scale:  0-10 Pain Score: 0-No pain Home Living/Prior Functioning Home Living Family/patient expects to be discharged to:: Private residence Living Arrangements: Spouse/significant other Available Help at Discharge: Family, Available PRN/intermittently Type of Home: House Home Access: Stairs to enter [employer] of Steps: 2-3 (Pt son is working on building a ramp.) Entrance Stairs-Rails: Right Home Layout: One level Bathroom Shower/Tub: Gaffer (has shower chair) Biochemist, clinical: Programmer, systems: Yes  Lives With: Spouse IADL History Homemaking Responsibilities: No Prior Function Level of Independence: Independent with basic ADLs, Independent with gait, Independent with homemaking with ambulation, Independent with transfers  Able to Take Stairs?: Yes Driving: Yes Vocation: Retired Comments: amb household distances with RW. Multiple falls.  Spouse assist with community mobility, meals, and home management. Vision Baseline Vision/History: Wears glasses Wears Glasses: At all times Patient Visual Report: No change from baseline Vision Assessment?: No apparent visual deficits Perception  Perception: Within Functional Limits Praxis Praxis: Intact Cognition Overall Cognitive Status: Within Functional Limits for tasks assessed Arousal/Alertness: Awake/alert Orientation Level: Person;Place Year: 2022 Month: July Day of Week: Correct Memory: Appears intact Immediate Memory Recall: Sock;Blue;Bed Memory Recall Sock: Without Cue Memory Recall Blue: Without Cue Memory Recall Bed: Without Cue Awareness: Appears intact Problem Solving: Appears intact Behaviors: Other (comment) (flat affect) Safety/Judgment: Appears intact Sensation Sensation Light Touch: Appears Intact (intact BUEs) Hot/Cold: Appears Intact Proprioception: Appears Intact Motor  Motor Motor: Other (comment) Motor - Skilled Clinical Observations: grossly limited strength and coordination   Trunk/Postural Assessment  Cervical Assessment Cervical Assessment: Within Functional Limits Thoracic Assessment Thoracic Assessment: Within Functional Limits Lumbar Assessment Lumbar Assessment: Within Functional Limits Postural Control Postural Control: Within Functional Limits  Balance Balance Balance Assessed: Yes Static Sitting Balance Static Sitting - Balance Support: No upper extremity supported Static Sitting - Level of Assistance: 5: Stand by assistance Dynamic Sitting Balance Dynamic Sitting - Balance Support: No upper extremity supported Dynamic Sitting - Level of Assistance: 4: Min assist Dynamic Sitting - Balance Activities: Reaching for objects;Forward lean/weight shifting Sitting balance -  Comments: retro lean when sitting unsupported 2/2 decreased core strength Extremity/Trunk Assessment RUE Assessment RUE Assessment: Within Functional Limits LUE Assessment LUE Assessment: Within Functional Limits  Care Tool Care Tool Self Care Eating   Eating Assist Level: Set up assist    Oral Care    Oral Care Assist Level: Set up assist    Bathing   Body parts bathed by patient: Right arm;Chest;Left arm;Abdomen;Front perineal area;Face Body parts bathed by helper: Right lower leg Body parts n/a: Left lower leg Assist Level: Moderate Assistance - Patient 50 - 74%    Upper Body Dressing(including orthotics)   What is the patient wearing?: Pull over shirt   Assist Level: Set up assist    Lower Body Dressing (excluding footwear)   What is the patient wearing?: Pants Assist for lower body dressing: Maximal Assistance - Patient 25 - 49%    Putting on/Taking off footwear   What is the patient wearing?: Non-skid slipper socks Assist for footwear: Total Assistance - Patient < 25%       Care Tool Toileting Toileting activity   Assist for toileting: Maximal Assistance - Patient 25 - 49%     Care Tool Bed Mobility Roll left and right activity   Roll left and  right assist level: Supervision/Verbal cueing    Sit to lying activity        Lying to sitting edge of bed activity   Lying to sitting edge of bed assist level: Contact Guard/Touching assist     Care Tool Transfers Sit to stand transfer   Sit to stand assist level: Moderate Assistance - Patient 50 - 74%    Chair/bed transfer   Chair/bed transfer assist level: Moderate Assistance - Patient 50 - 74%     Toilet transfer   Assist Level: Moderate Assistance - Patient 50 - 74%     Care Tool Cognition Expression of Ideas and Wants Expression of Ideas and Wants: Without difficulty (complex and basic) - expresses complex messages without difficulty and with speech that is clear and easy to understand   Understanding Verbal and Non-Verbal Content Understanding Verbal and Non-Verbal Content: Understands (complex and basic) - clear comprehension without cues or repetitions   Memory/Recall Ability *first 3 days only Memory/Recall Ability *first 3 days only: Current season;Location of own room;Staff names and faces;That he or she is in a hospital/hospital unit    Refer to Care Plan for [place] 1 OT Short Term Goal 1 (Week 1): Pt will complete LB dressing with min A and AE prn. OT Short Term Goal 2 (Week 1): Pt will complete functional transfers to all appropriate surfaces with CGA and LRAD. OT Short Term Goal 3 (Week 1): Pt will complete 3/3 aspects of toileting with min A. OT Short Term Goal 4 (Week 1): Pt will groom standing sink side with CGA with no more than one rest break  for improved standing tolerance for ADLs.  Recommendations for other services: Neuropsych   Skilled Therapeutic Intervention Pt received in room in bed and consented to OT eval and tx. Pt was sleeping and had not yet eaten breakfast, setup with breakfast while initiating OT eval. After pt finished eating session transitioned to self care retraining and functional transfer training. Pt  stood from raised bed to RW with mod A, however, pt was unable to hop or pivot to w/c. Utilized slideboard for SB transfer with min-mod A to move from EOB to w/c with education on proper  hand placement and body mechanics. Pt then washed up sink side, but refused LB bathing and reported "I'll just do that later." Req max A to put on pants, therapist hiked while pt did w/c pushup as pt unable to stand at sink from w/c with multiple attempts. After tx, pt left up in w/c with all needs met, seatbelt alarm on.    ADL ADL Eating: Set up Where Assessed-Eating: Chair Grooming: Setup Where Assessed-Grooming: Sitting at sink Upper Body Bathing: Setup Where Assessed-Upper Body Bathing: Sitting at sink Lower Body Bathing: Not assessed (pt refused) Where Assessed-Lower Body Bathing: Sitting at sink Upper Body Dressing: Setup Where Assessed-Upper Body Dressing: Sitting at sink Lower Body Dressing: Maximal assistance Where Assessed-Lower Body Dressing: Sitting at sink Toileting: Maximal assistance Where Assessed-Toileting: Glass blower/designer: Psychiatric nurse Method: Theatre manager: Drop arm bedside commode Mobility  Bed Mobility Bed Mobility: Supine to Sit;Sitting - Scoot to Edge of Bed Supine to Sit: Contact Guard/Touching assist Sitting - Scoot to Edge of Bed: Contact Guard/Touching assist Transfers Sit to Stand: Moderate Assistance - Patient 50-74% (RW)   Discharge Criteria: Patient will be discharged from OT if patient refuses treatment 3 consecutive times without medical reason, if treatment goals not met, if there is a change in medical status, if patient makes no progress towards goals or if patient is discharged from hospital.  The above assessment, treatment plan, treatment alternatives and goals were discussed and mutually agreed upon: by patient  Paulette Blanch 05/19/2021, 9:49 AM

## 2021-05-19 NOTE — Progress Notes (Signed)
[facility] Progress Note    Assessment/ Plan:   ESRD: has been on HD due to issues with CCPD: inadequate drainage/UF (catheter malfunction?) Outpatient orders: [facility], MWF, F180, 3hrs, 2k, 2.5cal, 137 na, 35bicarb, 400bfr, 16g, edw 86.5kg. hep 2k bolus -discussed with his outpatient home therapies RN 7/5: no plans for further PD and is being scheduled to have PD cath removed as an outpatient, therefore no PD cath flushing needed.  Dressing to be changed daily - RN will facilitate; to be done here during HD MWF and on floor by dialysis RN on off days. Gent cream will be applied too.  -HD MWF, HD today, next Mon -7/13 AVF infiltration thought to be due to arm moved - no issues today   Cellulitis/diabetic foot, progressive gangrene Left transtibial amputation 7/8. [name]. [name]    Volume/ hypertension: EDW 86.5kg pre amputation. needs new edw post amputation-Will challenge Will see if we can get a standing wt close to d/c   Anemia of Chronic Kidney Disease: Hemoglobin 8.0. No active infection so will give 0.5g IV iron load over 4 HD treatments. Receives mircera 150 mcg, last dose 6/24, ordered aranesp here. Transfuse prn   Secondary Hyperparathyroidism/Hyperphosphatemia: decrease auryxia dose with phos running on low side, renal diet, monitor phos   DM2 w/ hyperglycemia -mgmt per primary service  Hypoalbuminemia, protein calorie malnutrition -push protein    # Additional recommendations: - Dose all meds for creatinine clearance < 10 ml/min  - Unless absolutely necessary, no MRIs with gadolinium.  - Implement save arm precautions.  Prefer needle sticks in the dorsum of the hands or wrists.  No blood pressure measurements in arm. - If blood transfusion is requested during hemodialysis sessions, please alert [place] prior to the session.    Jannifer Hick MD [facility] Pager [phone]   Subjective:   Seen and examined in HD. No new issues. On [name] now.   No issues with AVF this dialysis.     Objective:   BP (!) 93/55   Pulse 72   Temp (!) 97.5 F (36.4 C) (Oral)   Resp (!) 25   Ht 5\' 11"  (1.803 m)   Wt 92.5 kg   SpO2 97%   BMI 28.44 kg/m   Intake/Output Summary (Last 24 hours) at 05/19/2021 1358 Last data filed at 05/19/2021 0900 Gross per 24 hour  Intake 120 ml  Output --  Net 120 ml    Weight change:   Physical Exam: Gen:nad, nontoxic appearing CVS:rrr Resp:normal wob KGU:RKYHC RLE edema, LLE amputated with immobilizer on Neuro: sleeping Access: avf +b/t - Qb 400  Imaging: No results found.  Labs: BMET Recent Labs  Lab 05/13/21 0159 05/14/21 0145 05/15/21 0837 05/17/21 0728 05/19/21 0533  NA 134* 133* 127* 133* 131*  K 4.0 4.7 4.8 4.4 4.4  CL 99 97* 94* 98 95*  CO2 27 27 24 28 30   GLUCOSE 209* 204* 179* 169* 165*  BUN 33* 50* 75* 62* 56*  CREATININE 3.95* 5.13* 6.16* 5.80* 5.24*  CALCIUM 8.3* 8.5* 8.5* 9.0 8.8*  PHOS 3.2  --  3.4 2.8  --     CBC Recent Labs  Lab 05/14/21 0145 05/15/21 0837 05/17/21 0309 05/19/21 0533  WBC 11.2* 10.7* 10.2 8.2  HGB 8.4* 8.2* 8.2* 8.0*  HCT 27.4* 27.3* 26.6* 25.9*  MCV 100.7* 101.1* 100.4* 101.6*  PLT 178 190 192 189     Medications:     aspirin EC  81 mg Oral Daily  atorvastatin  80 mg Oral Daily   calcitRIOL  0.25 mcg Oral BID   clopidogrel  75 mg Oral Daily   [START ON 05/24/2021] darbepoetin ([name]) injection - DIALYSIS  150 mcg Intravenous Q Wed-HD   escitalopram  10 mg Oral Daily   feeding supplement (NEPRO CARB STEADY)  237 mL Oral QHS   ferric citrate  420 mg Oral TID with meals   gabapentin  300 mg Oral QHS   gentamicin cream   Topical Daily   heparin  5,000 Units Subcutaneous Q8H   insulin aspart  0-6 Units Subcutaneous TID WC   insulin glargine  5 Units Subcutaneous QHS   [START ON 05/22/2021] midodrine  5 mg Oral Q M,W,F-HD   multivitamin  1 tablet Oral QHS   nutrition supplement (JUVEN)  1 packet Oral BID BM   pantoprazole  40 mg  Oral Daily   pentoxifylline  400 mg Oral TID WC   sorbitol  30 mL Oral BID   tamsulosin  0.4 mg Oral QHS   zinc sulfate  220 mg Oral Daily

## 2021-05-19 NOTE — Progress Notes (Signed)
Patient ID: Maxwell Harmon, male   DOB: 1955-12-14, 65 y.o.   MRN: [id number]  SW unable to meet with pt due to pt being off floor due to procedure (dialysis- MWF in [place]).  SW called pt wife Maxwell Harmon [phone]) to inform on ELOS 12-14 days and pt requiring 24/7 care at this time. She states it is just the two of them at this time, and she works 3 days per week. States she believes that she can work out additional support for someone to be with patient, and/or take time off.  SW left statement of service in pt room.   Loralee Pacas, MSW, [place] Office: [phone] Cell: [phone] Fax: [phone]

## 2021-05-19 NOTE — Progress Notes (Signed)
Occupational Therapy Session Note  Patient Details  Name: Maxwell Harmon MRN: [id number] Date of Birth: 18-May-1956  Today's Date: 05/19/2021 OT Individual Time: 1100-1200 OT Individual Time Calculation (min): 60 min    Short Term Goals: Week 1:  OT Short Term Goal 1 (Week 1): Pt will complete LB dressing with min A and AE prn. OT Short Term Goal 2 (Week 1): Pt will complete functional transfers to all appropriate surfaces with CGA and LRAD. OT Short Term Goal 3 (Week 1): Pt will complete 3/3 aspects of toileting with min A. OT Short Term Goal 4 (Week 1): Pt will groom standing sink side with CGA with no more than one rest break  for improved standing tolerance for ADLs.  Skilled Therapeutic Interventions/Progress Updates:  Pt received in room and consented to OT tx. Session focused on BUE strengthening HEP to increase strength and activity tolerance for ADLs and functional transfers. Pt instructed in 2#db unilateral exercises including elbow flexion, chest press, shoulder press (no weight 2/2 muscle fatigue) all for 3x15 with min cuing for proper technique with good carryover. Pt then instructed in 2# dowel rod exercises including chest press, shoulder flexion, upright row for 3x10 with min cuing for proper tech with good carryover. Pt also trained in w/c mobility and mgmt, cuing for proper propulsion and turing in tight spaces. Pt requires frequent rest breaks when propelling w/c 2/2 fatigue. Pt also trained in 3# med ball exercises including trunk flexion trunk lateral rotation for 3x15 to increase core stability ad strength for improved sitting balance for ADLs. After tx, pt left up in w/c with all needs met, awaiting lunch.    Therapy Documentation Precautions:  Precautions Precautions: Fall Precaution Comments: charcot foot, wound VAC, LLE NWB Required Braces or Orthoses: Other Brace Other Brace: Limb Guard Restrictions Weight Bearing Restrictions: Yes LLE Weight Bearing: Non  weight bearing Other Position/Activity Restrictions: limb protector for mobility General: General Chart Reviewed: Yes Family/Caregiver Present: No Vital Signs:   Pain: Pain Assessment Pain Scale: 0-10 Pain Score: 0-No pain   Therapy/Group: Individual Therapy  Mckenzie Bove 05/19/2021, 11:12 AM

## 2021-05-19 NOTE — Progress Notes (Signed)
Inpatient Rehabilitation  Patient information reviewed and entered into eRehab system by Jailee Jaquez Alezandra Egli, OTR/L.   Information including medical coding, functional ability and quality indicators will be reviewed and updated through discharge.    

## 2021-05-20 ENCOUNTER — Inpatient Hospital Stay (HOSPITAL_COMMUNITY): Payer: Managed Care, Other (non HMO)

## 2021-05-20 LAB — GLUCOSE, CAPILLARY
Glucose-Capillary: 144 mg/dL — ABNORMAL HIGH (ref 70–99)
Glucose-Capillary: 144 mg/dL — ABNORMAL HIGH (ref 70–99)
Glucose-Capillary: 191 mg/dL — ABNORMAL HIGH (ref 70–99)
Glucose-Capillary: 233 mg/dL — ABNORMAL HIGH (ref 70–99)

## 2021-05-20 MED ORDER — CLONAZEPAM 0.25 MG PO TBDP
0.2500 mg | ORAL_TABLET | Freq: Two times a day (BID) | ORAL | Status: DC | PRN
  Administered 2021-05-21 – 2021-06-02 (×9): 0.25 mg via ORAL
  Filled 2021-05-20 (×12): qty 1

## 2021-05-20 MED ORDER — BENZONATATE 100 MG PO CAPS
100.0000 mg | ORAL_CAPSULE | Freq: Three times a day (TID) | ORAL | Status: DC | PRN
  Administered 2021-05-21: 100 mg via ORAL
  Filled 2021-05-20: qty 1

## 2021-05-20 MED ORDER — CINACALCET HCL 30 MG PO TABS
30.0000 mg | ORAL_TABLET | ORAL | Status: DC
  Administered 2021-05-22 – 2021-06-05 (×3): 30 mg via ORAL
  Filled 2021-05-20 (×3): qty 1

## 2021-05-20 MED ORDER — OXYCODONE HCL 5 MG PO TABS
2.5000 mg | ORAL_TABLET | ORAL | Status: DC | PRN
Start: 2021-05-20 — End: 2021-06-08
  Administered 2021-05-29: 2.5 mg via ORAL
  Administered 2021-05-29 – 2021-06-04 (×3): 5 mg via ORAL
  Filled 2021-05-20 (×7): qty 1

## 2021-05-20 MED ORDER — SORBITOL 70 % SOLN
30.0000 mL | Freq: Once | Status: AC
  Administered 2021-05-20: 30 mL via ORAL

## 2021-05-20 NOTE — Progress Notes (Signed)
PROGRESS NOTE   Subjective/Complaints:  Maxwell Harmon reports having cough on and off-  No pain right now- but has pain with therapy and afterwards.  Sleepy more than normal yesterday and last night- things due to pain meds and Klonopin. Tylenol not enough.  Ate 100% breakfast.  LBM Thursday- feels like needs help to go.   Will try Tessalon pearles for cough and add Flutter valve.    ROS:  Maxwell Harmon denies SOB, abd pain, CP, N/V/C/D, and vision changes   Objective:   DG CHEST PORT 1 VIEW  Result Date: 05/20/2021 CLINICAL DATA:  Cough and congestion. EXAM: PORTABLE CHEST 1 VIEW COMPARISON:  May 14, 2021 FINDINGS: Postsurgical changes from CABG. Calcific atherosclerotic disease and tortuosity of the aorta. Enlarged cardiac silhouette. Persistent streaky airspace opacities in the bilateral lung bases, left greater than right. Osseous structures are without acute abnormality. Soft tissues are grossly normal. IMPRESSION: 1. Persistent streaky airspace opacities in the bilateral lung bases, left greater than right. These may represent peribronchial infiltrates, atelectasis or early interstitial pulmonary edema. 2. Enlarged cardiac silhouette. Electronically Signed   By: Fidela Salisbury M.D.   On: 05/20/2021 12:42   Recent Labs    05/19/21 0533  WBC 8.2  HGB 8.0*  HCT 25.9*  PLT 189   Recent Labs    05/19/21 0533  NA 131*  K 4.4  CL 95*  CO2 30  GLUCOSE 165*  BUN 56*  CREATININE 5.24*  CALCIUM 8.8*    Intake/Output Summary (Last 24 hours) at 05/20/2021 1737 Last data filed at 05/20/2021 1315 Gross per 24 hour  Intake 1094 ml  Output --  Net 1094 ml        Physical Exam: Vital Signs Blood pressure 114/68, pulse 96, temperature 98.2 F (36.8 C), temperature source Oral, resp. rate 16, height 5\' 11"  (1.803 m), weight 93 kg, SpO2 96 %.    General: awake, alert, appropriate, sitting up in bed- appears a little dazed,  but speaking OK; NAD HENT: conjugate gaze; oropharynx moist CV: regular rate; no JVD Pulmonary: CTA B/L; no W/R/R- good air movement- a little cough noted- bronchial sounding- decreased as bases B/L GI: soft, NT, ND, (+)BS Psychiatric: appropriate but a little dazed? Neurological: alert  Musculoskeletal:    Cervical back: Normal range of motion and neck supple.    Comments: Right heel boggy and red. R foot- Charcot food- has no arch- no actual skin broken down- scabs R calf scattered   L BKA with VAC in place- no change UE 5/5 B/L in biceps, triceps, WE< grip and FA Les- RLE- HF 4+/5, KF/KE 4/5, DF and PF 4+/5 LLE- HF and KE at least 3+/5- painful to do ROM Shaping OK- has shrinker over VAC- bulbous at end  Skin:    Comments: IV R forearm LUE fistula- can feel thrill Scattered scabs on R calf/shin Fallen arch on R foot/charcot foot R heel boggy  Neurological:    Mental Status: He is alert and oriented to person, place, and time.    Comments: Decreased to absent Sensation to light touch from R knee downwards  RUE twitching occ.    Assessment/Plan:  1. Functional deficits which require 3+ hours per day of interdisciplinary therapy in a comprehensive inpatient rehab setting. Physiatrist is providing close team supervision and 24 hour management of active medical problems listed below. Physiatrist and rehab team continue to assess barriers to discharge/monitor patient progress toward functional and medical goals  Care Tool:  Bathing    Body parts bathed by patient: Right arm, Chest, Left arm, Abdomen, Front perineal area, Face   Body parts bathed by helper: Right lower leg Body parts n/a: Left lower leg   Bathing assist Assist Level: Moderate Assistance - Patient 50 - 74%     Upper Body Dressing/Undressing Upper body dressing   What is the patient wearing?: Pull over shirt    Upper body assist Assist Level: Set up assist    Lower Body Dressing/Undressing Lower body  dressing      What is the patient wearing?: Pants     Lower body assist Assist for lower body dressing: Maximal Assistance - Patient 25 - 49%     Toileting Toileting    Toileting assist Assist for toileting: Maximal Assistance - Patient 25 - 49%     Transfers Chair/bed transfer  Transfers assist     Chair/bed transfer assist level: Moderate Assistance - Patient 50 - 74% Chair/bed transfer assistive device: Sliding board   Locomotion Ambulation   Ambulation assist   Ambulation activity did not occur: Safety/medical concerns          Walk 10 feet activity   Assist  Walk 10 feet activity did not occur: Safety/medical concerns        Walk 50 feet activity   Assist Walk 50 feet with 2 turns activity did not occur: Safety/medical concerns         Walk 150 feet activity   Assist Walk 150 feet activity did not occur: Safety/medical concerns         Walk 10 feet on uneven surface  activity   Assist Walk 10 feet on uneven surfaces activity did not occur: Safety/medical concerns         Wheelchair     Assist Will patient use wheelchair at discharge?: Yes Type of Wheelchair: Manual    Wheelchair assist level: Minimal Assistance - Patient > 75% Max wheelchair distance: 50 ft    Wheelchair 50 feet with 2 turns activity    Assist        Assist Level: Supervision/Verbal cueing   Wheelchair 150 feet activity     Assist  Wheelchair 150 feet activity did not occur: Safety/medical concerns       Blood pressure 114/68, pulse 96, temperature 98.2 F (36.8 C), temperature source Oral, resp. rate 16, height 5\' 11"  (1.803 m), weight 93 kg, SpO2 96 %.  Medical Problem List and Plan: 1.  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 2.   Antithrombotics: -DVT/anticoagulation:  Pharmaceutical: Heparin             -antiplatelet therapy: ASA/Plavix 3. Pain Management: Change dilaudid to oxycodone prn.             --resume gabapentin to help manage phantom pain.  7/16-  reduce Oxy to 2.5 to 5 mg q4 hours prn for pain and ocn't tramadol as well 4. Mood: LCSW to follow for evaluation and support.               -antipsychotic agents: N/A 5. Neuropsych: This patient is capable of making decisions on his own behalf. 6. Skin/Wound Care: Continue wound VAC             --On Zinc and Juven to help promote wound healing.  7/16- remove VAC- is due today-  7. Fluids/Electrolytes/Nutrition: Strict I/O with daily weights. 8. R -charcot foot: Encourage wearing CAM boot with activity. 9. T2DM: Hgb A1C-8.2. Monitor BS ac/hs             --continue Lantus 5 units with SSI for elevated BS  7/16- BG's 140s-180s- con't regimen for now and monitor 10. ESRD: HD MWF at the end of the day to facilitate therapy and endurance issues             --No plans to resume PD. Midodrine for BP support in hemo. 11. CAD w/recent NSTEMI: Treated medically w/ASA, Plavix and Lipitor.   12. Acute on chronic anemia: Loaded with IV ferric gluconate --continue [name] 150 mc weekly with serial CBC to monitor for stability . 13. PAD: On trental, plavix and ASA. 14. Chronic constipation: Continue Sorbitol bid.  7/16- LBM Thursday- gave an addiitonal dose of Sorbitol since cannot give Mg citrate 15. Hyperparathyroidism: Being managed with aruxia and Calcitriol.  16. Insomnia/RLS: Resume gabapentin for phantom pain as well as RLS.             --Klonopin prn for sleep 17 H/o  Depression/anxiety: Continue Lexapro (started a few weeks ago) and klonopin.  7/16- will decrease Klonopin to 0.25 mg BID prn since too sleepy/dazed this AM 18. Cough 7/16- will check CXR-  shows likely atelectasis since WBC is normal and afebrile.      37. Restart meds for hyperparathyroidism-      LOS: 2 days A FACE TO FACE EVALUATION WAS PERFORMED  Shatonia Hoots 05/20/2021, 5:37 PM

## 2021-05-20 NOTE — Progress Notes (Addendum)
Inpatient Rehabilitation Medication Review by a Pharmacist  A complete drug regimen review was completed for this patient to identify any potential clinically significant medication issues.  Clinically significant medication issues were identified:  yes   Type of Medication Issue Identified Description of Issue Urgent (address now) Non-Urgent (address on AM team rounds) Plan   Drug Interaction(s) (clinically significant)       Duplicate Therapy       Allergy       No Medication Administration End Date       Incorrect Dose       Additional Drug Therapy Needed  Cinacalcet Q Monday not resumed Non-urgent Consider resuming home cinacalcet > MD agrees   Other  -Escitalopram reported as not taking PTA but it was resumed on hospital admission. Pt was sleeping when called to confirm. Need to clarify if this is not a home med, should it be stopped?  -Nitro patch was reported as a home med but was not resumed on admission Non-urgent -Spoke with MD about escitalopram, would continue it for depression even if patient was not taking at home          -BP controlled without nitro patch, continue monitoring        For non-urgent medication issues to be resolved on team rounds tomorrow morning a [facility] Secure Chat Handoff was sent to: Algis Liming   Time spent performing this drug regimen review (minutes):  15 min   Benetta Spar, PharmD, BCPS, [facility] Clinical Pharmacist  Please check AMION for all [place] phone numbers After 10:00 PM, call [place]

## 2021-05-21 LAB — GLUCOSE, CAPILLARY
Glucose-Capillary: 215 mg/dL — ABNORMAL HIGH (ref 70–99)
Glucose-Capillary: 171 mg/dL — ABNORMAL HIGH (ref 70–99)
Glucose-Capillary: 161 mg/dL — ABNORMAL HIGH (ref 70–99)
Glucose-Capillary: 206 mg/dL — ABNORMAL HIGH (ref 70–99)

## 2021-05-21 MED ORDER — CHLORHEXIDINE GLUCONATE CLOTH 2 % EX PADS
6.0000 | MEDICATED_PAD | Freq: Every day | CUTANEOUS | Status: DC
  Administered 2021-05-22 – 2021-05-24 (×3): 6 via TOPICAL

## 2021-05-21 MED ORDER — MELATONIN 3 MG PO TABS
3.0000 mg | ORAL_TABLET | Freq: Every day | ORAL | Status: DC
  Administered 2021-05-21 – 2021-05-22 (×2): 3 mg via ORAL
  Filled 2021-05-21 (×2): qty 1

## 2021-05-21 MED ORDER — GABAPENTIN 300 MG PO CAPS
600.0000 mg | ORAL_CAPSULE | Freq: Every day | ORAL | Status: DC
  Administered 2021-05-21: 600 mg via ORAL
  Filled 2021-05-21: qty 2

## 2021-05-21 MED ORDER — FUROSEMIDE 10 MG/ML IJ SOLN
160.0000 mg | Freq: Once | INTRAVENOUS | Status: AC
  Administered 2021-05-21: 160 mg via INTRAVENOUS
  Filled 2021-05-21: qty 10

## 2021-05-21 NOTE — Progress Notes (Addendum)
PROGRESS NOTE   Subjective/Complaints:   Pt reports still having nerve pain Feels more awake, sharp- doesn't want Lexapro since thinks that's why less aware/awake.  Pain better in residual limb- ~ 1-2/10 with touch.  Was cathed last night for 400cc- is oliguric.  Poor sleep, esp trouble going to sleep- took Klonopin- helped a little.  Might benefit from Remeron, but he doesn't like the lexapro- will see how it goes.    ROS:  Pt denies SOB, abd pain, CP, N/V/C/D, and vision changes   Objective:   DG CHEST PORT 1 VIEW  Result Date: 05/20/2021 CLINICAL DATA:  Cough and congestion. EXAM: PORTABLE CHEST 1 VIEW COMPARISON:  May 14, 2021 FINDINGS: Postsurgical changes from CABG. Calcific atherosclerotic disease and tortuosity of the aorta. Enlarged cardiac silhouette. Persistent streaky airspace opacities in the bilateral lung bases, left greater than right. Osseous structures are without acute abnormality. Soft tissues are grossly normal. IMPRESSION: 1. Persistent streaky airspace opacities in the bilateral lung bases, left greater than right. These may represent peribronchial infiltrates, atelectasis or early interstitial pulmonary edema. 2. Enlarged cardiac silhouette. Electronically Signed   By: Fidela Salisbury M.D.   On: 05/20/2021 12:42   Recent Labs    05/19/21 0533  WBC 8.2  HGB 8.0*  HCT 25.9*  PLT 189   Recent Labs    05/19/21 0533  NA 131*  K 4.4  CL 95*  CO2 30  GLUCOSE 165*  BUN 56*  CREATININE 5.24*  CALCIUM 8.8*    Intake/Output Summary (Last 24 hours) at 05/21/2021 1040 Last data filed at 05/21/2021 0900 Gross per 24 hour  Intake 1187 ml  Output 500 ml  Net 687 ml        Physical Exam: Vital Signs Blood pressure 121/75, pulse 96, temperature (!) 97.5 F (36.4 C), temperature source Oral, resp. rate 20, height 5\' 11"  (1.803 m), weight 91.9 kg, SpO2 96 %.     General: awake,  alert, appropriate, sitting u in bed; nurse in room; NAD HENT: conjugate gaze; oropharynx moist CV: regular rate; no JVD Pulmonary: CTA B/L; no W/R/R- good air movement GI: soft, NT, ND, (+)BS Psychiatric: appropriate; but slightly delayed responses occ, not all the time.  Neurological: more alert, but still slightly dazed appearing-   Musculoskeletal:    Cervical back: Normal range of motion and neck supple.    Comments: Right heel boggy and red. R foot- Charcot food- has no arch- no actual skin broken down- scabs R calf scattered   L BKA with VAC in place- no change UE 5/5 B/L in biceps, triceps, WE< grip and FA Les- RLE- HF 4+/5, KF/KE 4/5, DF and PF 4+/5 LLE- HF and KE at least 3+/5- painful to do ROM Shaping OK- has shrinker over VAC- bulbous at end  Skin: L BKA- a little yellowish/serous drainage- appears ot have a popped blister and 1 that's intact medial aspect of BKA- shaping well- localized erythema, but no increased warmth.     Comments: IV R forearm LUE fistula- can feel thrill Scattered scabs on R calf/shin Fallen arch on R foot/charcot foot R heel boggy  Neurological:  Mental Status: He is alert and oriented to person, place, and time.    Comments: Decreased to absent Sensation to light touch from R knee downwards  RUE twitching occ.    Assessment/Plan: 1. Functional deficits which require 3+ hours per day of interdisciplinary therapy in a comprehensive inpatient rehab setting. Physiatrist is providing close team supervision and 24 hour management of active medical problems listed below. Physiatrist and rehab team continue to assess barriers to discharge/monitor patient progress toward functional and medical goals  Care Tool:  Bathing    Body parts bathed by patient: Right arm, Chest, Left arm, Abdomen, Front perineal area, Face   Body parts bathed by helper: Right lower leg Body parts n/a: Left lower leg   Bathing assist Assist Level: Moderate Assistance -  Patient 50 - 74%     Upper Body Dressing/Undressing Upper body dressing   What is the patient wearing?: Pull over shirt    Upper body assist Assist Level: Set up assist    Lower Body Dressing/Undressing Lower body dressing      What is the patient wearing?: Pants     Lower body assist Assist for lower body dressing: Maximal Assistance - Patient 25 - 49%     Toileting Toileting    Toileting assist Assist for toileting: Maximal Assistance - Patient 25 - 49%     Transfers Chair/bed transfer  Transfers assist     Chair/bed transfer assist level: Moderate Assistance - Patient 50 - 74% Chair/bed transfer assistive device: Sliding board   Locomotion Ambulation   Ambulation assist   Ambulation activity did not occur: Safety/medical concerns          Walk 10 feet activity   Assist  Walk 10 feet activity did not occur: Safety/medical concerns        Walk 50 feet activity   Assist Walk 50 feet with 2 turns activity did not occur: Safety/medical concerns         Walk 150 feet activity   Assist Walk 150 feet activity did not occur: Safety/medical concerns         Walk 10 feet on uneven surface  activity   Assist Walk 10 feet on uneven surfaces activity did not occur: Safety/medical concerns         Wheelchair     Assist Will patient use wheelchair at discharge?: Yes Type of Wheelchair: Manual    Wheelchair assist level: Minimal Assistance - Patient > 75% Max wheelchair distance: 50 ft    Wheelchair 50 feet with 2 turns activity    Assist        Assist Level: Supervision/Verbal cueing   Wheelchair 150 feet activity     Assist  Wheelchair 150 feet activity did not occur: Safety/medical concerns       Blood pressure 121/75, pulse 96, temperature (!) 97.5 F (36.4 C), temperature source Oral, resp. rate 20, height 5\' 11"  (1.803 m), weight 91.9 kg, SpO2 96 %.  Medical Problem List and Plan: 1.  L BKA secondary to  chronic L foot nonhealing ulcer             -patient may not shower until VAC is removed             -ELOS/Goals: 7-10 days supervision-             -family bringing in his boot to walk in for R Charcot foot.  -con't PT and OT/[name]- will ask for mirror therapy! 2.  Antithrombotics: -DVT/anticoagulation:  Pharmaceutical: Heparin             -antiplatelet therapy: ASA/Plavix 3. Pain Management: Change dilaudid to oxycodone prn.             --resume gabapentin to help manage phantom pain.  7/16- reduce Oxy to 2.5 to 5 mg q4 hours prn for pain and ocn't tramadol as well  7/17- pain still an issue- nerve pain and residual limb- on max dose of Gabapentin- will ask therapy to do mirror therapy.  4. Mood: LCSW to follow for evaluation and support.               -antipsychotic agents: N/A 5. Neuropsych: This patient is capable of making decisions on his own behalf. 6. Skin/Wound Care: Continue wound VAC             --On Zinc and Juven to help promote wound healing.  7/16- remove VAC- is due today-   7/17- has 1 blister, but looks OK- will monitor 7. Fluids/Electrolytes/Nutrition: Strict I/O with daily weights. 8. R -charcot foot: Encourage wearing CAM boot with activity. 9. T2DM: Hgb A1C-8.2. Monitor BS ac/hs             --continue Lantus 5 units with SSI for elevated BS  7/16- BG's 140s-180s- con't regimen for now and monitor 10. ESRD: HD MWF at the end of the day to facilitate therapy and endurance issues             --No plans to resume PD. Midodrine for BP support in hemo. 11. CAD w/recent NSTEMI: Treated medically w/ASA, Plavix and Lipitor.   12. Acute on chronic anemia: Loaded with IV ferric gluconate --continue [name] 150 mc weekly with serial CBC to monitor for stability . 13. PAD: On trental, plavix and ASA. 14. Chronic constipation: Continue Sorbitol bid.  7/16- LBM Thursday- gave an addiitonal dose of Sorbitol since cannot give Mg citrate  7/17- LBM overnight- was hard- will con't  Sorbitol BID- might need more as time goes on.  15. Hyperparathyroidism: Being managed with aruxia and Calcitriol.  16. Insomnia/RLS: Resume gabapentin for phantom pain as well as RLS.             --Klonopin prn for sleep  7/17- cannot use trazodone for sleep- causes urinary retention/makes it worse- will d/c prn trazodone- con't klonopin and add Melatonin- pt said not real helpful, but maybe with klonpin will help. .  17 H/o  Depression/anxiety: Continue Lexapro (started a few weeks ago) and klonopin.  7/16- will decrease Klonopin to 0.25 mg BID prn since too sleepy/dazed this AM  7/17- d/c lexapro per pt request.  18. Cough 7/16- will check CXR-  shows likely atelectasis since WBC is normal and afebrile.      19. Restart meds for hyperparathyroidism-  32. Oliguria/Urinary retention  7/17- required cath for 500cc overnight; will ask to bladder scan q8 hours- usually voided 1-2x/day per pt. Don't want him getting UTI.     Got approval from Pharmacy to increase gabapentin to 600 mg QHS- will do so to help with sleeping and nerve pain.   LOS: 3 days A FACE TO FACE EVALUATION WAS PERFORMED  Enid Maultsby 05/21/2021, 10:40 AM

## 2021-05-21 NOTE — IPOC Note (Signed)
Overall Plan of Care [facility]) Patient Details Name: Maxwell Harmon MRN: [id number] DOB: [birth year]  Admitting Diagnosis: Below-knee amputation of left lower extremity [facility])  Hospital Problems: Principal Problem:   Below-knee amputation of left lower extremity ([place]) Active Problems:   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[place])   ESRD on hemodialysis ([place])   Acute on chronic systolic (congestive) heart failure ([place])     Functional Problem List: Nursing Edema, Endurance, Medication Management, Pain, Safety, Skin Integrity  PT Balance, Edema, Endurance, Motor, Pain, Skin Integrity, Sensory, Safety  OT Balance, Pain, Safety, Endurance, Skin Integrity, Motor  SLP    TR         Basic ADL's: OT Bathing, Dressing, Toileting     Advanced  ADL's: OT       Transfers: PT Bed Mobility, Bed to Chair, Car, Furniture, Futures trader, Metallurgist: PT Ambulation, Emergency planning/management officer, Stairs     Additional Impairments: OT None  SLP        TR      Anticipated Outcomes Item Anticipated Outcome  Self Feeding independent  Swallowing      Basic self-care  mod I  Toileting  supervision   Bathroom Transfers supervision  Bowel/Bladder  N/A  Transfers  supervision  Locomotion  mod I with w/c  Communication     Cognition     Pain  < 3  Safety/Judgment  Supervision and no falls   Therapy Plan: PT Intensity: Minimum of 1-2 x/day ,45 to 90 minutes PT Frequency: 5 out of 7 days PT Duration Estimated Length of Stay: 12-14 days OT Intensity: Minimum of 1-2 x/day, 45 to 90 minutes OT Frequency: 5 out of 7 days OT Duration/Estimated Length of Stay: 2 weeks     Due to the current state of emergency, patients may not be receiving their 3-hours of Medicare-mandated therapy.   Team Interventions: Nursing Interventions Patient/Family Education, Disease Management/Prevention, Pain Management, Medication Management, Skin  Care/Wound Management, Discharge Planning, Psychosocial Support  PT interventions Ambulation/gait training, Discharge planning, DME/adaptive equipment instruction, Functional mobility training, Pain management, Psychosocial support, Therapeutic Activities, UE/LE Strength taining/ROM, Community reintegration, Disease management/prevention, Neuromuscular re-education, Patient/family education, Skin care/wound management, Stair training, Therapeutic Exercise, UE/LE Coordination activities, Wheelchair propulsion/positioning  OT Interventions Training and development officer, Discharge planning, Community reintegration, Engineer, drilling, Neuromuscular re-education, Functional mobility training, Psychosocial support, Skin care/wound managment, Therapeutic Activities, UE/LE Strength taining/ROM, Wheelchair propulsion/positioning, UE/LE Coordination activities, Therapeutic Exercise, Self Care/advanced ADL retraining, Patient/family education  SLP Interventions    TR Interventions    SW/CM Interventions     Barriers to Discharge MD  Medical stability, Home enviroment access/loayout, Wound care, Lack of/limited family support, Hemodialysis, Weight bearing restrictions, and Behavior  Nursing Decreased caregiver support, Home environment access/layout, Wound Care, Lack of/limited family support, Weight, Hemodialysis, Weight bearing restrictions, Medication compliance, Behavior 1 level home, 2-3 steps to enter with right hand rail. Walk-in shower. Spouse to be caregiver, available 24/7.  PT Inaccessible home environment, Hemodialysis, Weight bearing restrictions comorbid R charcot foot  OT Inaccessible home environment, Decreased caregiver support, Wound Care, Hemodialysis    SLP      SW       Team Discharge Planning: Destination: PT-Home ,OT- Home , SLP-  Projected Follow-up: PT-Home health PT, OT-  Home health OT, SLP-  Projected Equipment Needs: PT-Wheelchair (measurements), Wheelchair  cushion (measurements), Rolling walker with 5" wheels, Sliding board, OT- 3 in 1 bedside comode, To be determined,  SLP-  Equipment Details: PT-18x18 w/c, L amputee pad, OT-DA BSC Patient/family involved in discharge planning: PT- Patient,  OT-Patient, SLP-   MD ELOS: 12-14 days Medical Rehab Prognosis:  Good Assessment: Pt is a 65 yr old male with hx of ESRD,  CAD with recent NSTEMI, DM/Ac1c 8.2, anemia, PAD- s/p L BKA and new urinary retention- is oliguric.  Phantom and residual limb pain.   Goals mod I    See Team Conference Notes for weekly updates to the plan of care

## 2021-05-21 NOTE — Progress Notes (Signed)
Physical Therapy Session Note  Patient Details  Name: Maxwell Harmon MRN: [id number] Date of Birth: 03-Oct-1956  Today's Date: 05/21/2021 PT Individual Time: 0800-0900; 1540-1550; 1610-1650 PT Individual Time Calculation (min): 60 min and 10 min and 40 min  Short Term Goals: Week 1:  PT Short Term Goal 1 (Week 1): Pt will initiate gait training with LRAD PT Short Term Goal 2 (Week 1): Pt will perform lateral transfer with CGA PT Short Term Goal 3 (Week 1): Pt will be mod I with w/c propulsion up to 150 ft  Skilled Therapeutic Interventions/Progress Updates:    Session 1: Pt received seated in bed, agreeable to PT session. No complaints of pain. Assisted pt with donning residual limb guard at bed level. Seated in bed to sitting EOB with CGA. Pt with intermittent LOB posteriorly and to the R, requires min A to maintain sitting balance EOB. Assisted pt with donning tennis shoe while seated EOB. Slide board transfer to w/c with mod A to the L. Manual w/c propulsion 2 x 100 ft with close Supervision with cues for propulsion technique. Due to LUE weakness > RUE pt tends to veer to the L in w/c. Slide board transfer with min A to the R to mat table. Sit to stand x 3 reps from elevated mat table to RW with mod A. Pt unable to push up with one UE on mat table, utilizes both UE on RW to stand. Pt initially with significant posterior lean in standing while bracing RLE against mat table, with cueing and manual assist for anterior weight shift able to maintain standing balance with RW without bracing against table. Pt unable to move RLE while standing with RW. Seated balance performing ball toss x 30 reps with intermittent LOB posteriorly that pt able to correct with cueing. Pt left seated in w/c in room with needs in reach, quick release belt and chair alarm in place at end of session.  Session 2: Pt received seated in bed, awaiting arrival of IV team for new IV placement. Pt agreeable to PT session. No  complaints of pain. Provided patient with home measurement sheet to have family complete. Introduced Investment banker, operational therapy with patient for pain management of residual limb. IV team arrives, pt left in care of IV nurses. Will continue therapy session following IV placement.  Session 3: This therapist returned after 20 min once IV team done placing new IV in patient. Pt agreeable to PT session, no complaints of pain. Bed mobility Supervision with use of bedrail. Slide board transfer bed to w/c with min A to the R. Manual w/c propuulsion 2 x 150 ft with use of BUE at Supervision level. Reviewed management of w/c parts. Pt reports minimal phantom limb pain at this time. Reviewed use of mirror therapy for management of pain as needed. Pt able to perform seated LAQ, marches, ankle pumps with use of mirror and cues to visualize LLE performing therex. Sit to stand in // bars x 3 reps with mod A. Attempt to have pt hop with RLE in bars, unable to lift LE from the floor. Standing dips 2 x 3 reps to fatigue, pt reports onset of BUE fatigue > RLE fatigue with this exercise. Pt will benefit from ongoing BUE strengthening in order to perform transfers and mobility with RW. Pt left seated in w/c in room with needs in reach, quick release belt and chair alarm in place at end of session.   Therapy Documentation Precautions:  Precautions Precautions:  Fall Precaution Comments: charcot foot, wound VAC, LLE NWB Required Braces or Orthoses: Other Brace Other Brace: Limb Guard Restrictions Weight Bearing Restrictions: Yes LLE Weight Bearing: Non weight bearing Other Position/Activity Restrictions: limb protector for mobility   Therapy/Group: Individual Therapy   Excell Seltzer, PT, DPT, CSRS  05/21/2021, 12:00 PM

## 2021-05-21 NOTE — Progress Notes (Signed)
[facility] Progress Note    Assessment/ Plan:   ESRD: has been on HD due to issues with CCPD: inadequate drainage/UF (catheter malfunction?) Outpatient orders: [facility], MWF, F180, 3hrs, 2k, 2.5cal, 137 na, 35bicarb, 400bfr, 16g, edw 86.5kg. hep 2k bolus -discussed with his outpatient home therapies RN 7/5: no plans for further PD and is being scheduled to have PD cath removed as an outpatient, therefore no PD cath flushing needed.  Dressing to be changed daily - RN will facilitate; to be done here during HD MWF and on floor by dialysis RN on off days. Gent cream will be applied too.  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 [name]. [name]    Volume/ hypertension: EDW 86.5kg pre amputation. Over EDW and with possible pulm edema - push UF tomorrow.   I'll give a high dose of lasix today also to facilitate volume off.    Anemia of Chronic Kidney Disease: Hemoglobin 8.0. No active infection so will gave 0.5g IV iron load over 4 HD treatments. Receives mircera 150 mcg, last dose 6/24, ordered aranesp here. Transfuse prn   Secondary Hyperparathyroidism/Hyperphosphatemia: decreased auryxia dose with phos running on low side, renal diet, monitor phos   DM2 w/ hyperglycemia -mgmt per primary service  Hypoalbuminemia, protein calorie malnutrition -push protein    # Additional recommendations: - Dose all meds for creatinine clearance < 10 ml/min  - Unless absolutely necessary, no MRIs with gadolinium.  - Implement save arm precautions.  Prefer needle sticks in the dorsum of the hands or wrists.  No blood pressure measurements in arm. - If blood transfusion is requested during hemodialysis sessions, please alert [place] prior to the session.    Jannifer Hick MD [facility] Pager  [phone]   Subjective:   Cough and congestion reported yesterday - CXR with bibasilar opacities, L > R.  He feels this symptom is improved today.  No issues with HD Friday.   [name] going fine.    Objective:   BP 121/75 (BP Location: Right Arm)   Pulse 96   Temp (!) 97.5 F (36.4 C) (Oral)   Resp 20   Ht 5\' 11"  (1.803 m)   Wt 91.9 kg   SpO2 96%   BMI 28.26 kg/m   Intake/Output Summary (Last 24 hours) at 05/21/2021 1252 Last data filed at 05/21/2021 0900 Gross per 24 hour  Intake 1187 ml  Output 500 ml  Net 687 ml    Weight change: -0.6 kg  Physical Exam: Gen:nad, nontoxic appearing CVS:rrr Resp:normal wob on RA, a few basilar rales LGX:QJJHE RLE 1+ edema, LLE amputated with immobilizer on Neuro: sleeping Access: avf +b/t   Imaging: DG CHEST PORT 1 VIEW  Result Date: 05/20/2021 CLINICAL DATA:  Cough and congestion. EXAM: PORTABLE CHEST 1 VIEW COMPARISON:  May 14, 2021 FINDINGS: Postsurgical changes from CABG. Calcific atherosclerotic disease and tortuosity of the aorta. Enlarged cardiac silhouette. Persistent streaky airspace opacities in the bilateral lung bases, left greater than right. Osseous structures are without acute abnormality. Soft tissues are grossly normal. IMPRESSION: 1. Persistent streaky airspace opacities in the bilateral lung bases, left greater than right. These may represent peribronchial infiltrates, atelectasis or early interstitial pulmonary edema. 2. Enlarged cardiac silhouette. Electronically Signed   By: Fidela Salisbury M.D.   On: 05/20/2021 12:42    Labs:  BMET Recent Labs  Lab 05/15/21 0837 05/17/21 0728 05/19/21 0533  NA 127* 133* 131*  K 4.8 4.4 4.4  CL 94* 98 95*  CO2 24 28 30   GLUCOSE 179* 169* 165*  BUN 75* 62* 56*  CREATININE 6.16* 5.80* 5.24*  CALCIUM 8.5* 9.0 8.8*  PHOS 3.4 2.8  --     CBC Recent Labs  Lab 05/15/21 0837 05/17/21 0309 05/19/21 0533  WBC 10.7* 10.2 8.2  HGB 8.2* 8.2* 8.0*  HCT 27.3* 26.6* 25.9*   MCV 101.1* 100.4* 101.6*  PLT 190 192 189     Medications:     aspirin EC  81 mg Oral Daily   atorvastatin  80 mg Oral Daily   calcitRIOL  0.25 mcg Oral BID   [START ON 05/22/2021] Chlorhexidine Gluconate Cloth  6 each Topical Q0600   [START ON 05/22/2021] cinacalcet  30 mg Oral Q Mon   clopidogrel  75 mg Oral Daily   [START ON 05/24/2021] darbepoetin ([name]) injection - DIALYSIS  150 mcg Intravenous Q Wed-HD   feeding supplement (NEPRO CARB STEADY)  237 mL Oral QHS   ferric citrate  420 mg Oral TID with meals   gabapentin  600 mg Oral QHS   gentamicin cream   Topical Daily   heparin  5,000 Units Subcutaneous Q8H   insulin aspart  0-6 Units Subcutaneous TID WC   insulin glargine  5 Units Subcutaneous QHS   melatonin  3 mg Oral QHS   [START ON 05/22/2021] midodrine  5 mg Oral Q M,W,F-HD   multivitamin  1 tablet Oral QHS   nutrition supplement (JUVEN)  1 packet Oral BID BM   pantoprazole  40 mg Oral Daily   pentoxifylline  400 mg Oral TID WC   sorbitol  30 mL Oral BID   tamsulosin  0.4 mg Oral QHS   zinc sulfate  220 mg Oral Daily

## 2021-05-21 NOTE — Progress Notes (Signed)
Occupational Therapy Session Note  Patient Details  Name: Maxwell Harmon MRN: [id number] Date of Birth: December 02, 1955  Today's Date: 05/21/2021 OT Individual Time: 8099-8338 and 2505-3976 OT Individual Time Calculation (min): 60 min and 32 min   Short Term Goals: Week 1:  OT Short Term Goal 1 (Week 1): Pt will complete LB dressing with min A and AE prn. OT Short Term Goal 2 (Week 1): Pt will complete functional transfers to all appropriate surfaces with CGA and LRAD. OT Short Term Goal 3 (Week 1): Pt will complete 3/3 aspects of toileting with min A. OT Short Term Goal 4 (Week 1): Pt will groom standing sink side with CGA with no more than one rest break  for improved standing tolerance for ADLs.  Skilled Therapeutic Interventions/Progress Updates:    Session 1: Pt received seated in w/c, agreeable to OT, reporting no pain this session. PT had requested OT retrieve drop arm BSC, but OT unable to locate any drop arm BSC currently in storage. Pt self-propelled ~300' in w/c spvsn with increased time targeting BUE strengthening/conditioning. Squat pivot w/c>therapy mat mod A; pt demoing difficulties clearing buttocks. Seated on mat engaged in BUE and core conditioning with 2kg weighted ball (modified [place] twists, chest press, overhead press) 15x each, 3lb dowel rod volleyball x15, orange level 2 theraband - abduction, forward punches, triceps pulls and biceps curls 10x each. Engaged in shoulder/scapular stretches and encouraged to practice frequently for improved posture and strengthening. Intermittent rest breaks provided due to fatigue; pt reporting weak LUE PTA but increased since dialysis "punctured vein" last week. Sit>sup on mat close spvsn and mod vc's for technique. Sup>prone mod vc's and close spvsn-CGA. While prone, pt engaged in hip EXT and abduction x15 on LLE; education provided on importance of prone positioning to decrease risk of contractures. Engaged in modified cobra stretching.  Prone>Sup>sit on mat mod A and increased time and mod vc's. Pt reporting increased fatigue following change in positioning. Education on residual limb skin integrity and provided with inspection mirror. Slide board transfer mat>w/c min A. Pt returned to room, remained seated in w/c, alarm set, call bell in reach, and all immediate needs met.   Session 2: Pt received seated in w/c, agreeable to OT, reporting no pain. Session focused on DME education, BUE conditioning, and residual limb care. Education and discussion of DME and home set up especially in bathroom; would potentially recommend bariatric drop arm BSC to be used with SBTs as pt reports current borrowed w/c at home will not fit in bathroom. Pt engaged in 15 w/c pushups for BUE conditioning in preparation for improved independence in functional transfers and sit<>stands. Education on desensitization techniques for residual limb care, preprosthetic training, and pain management. Emotional support provided as pt reporting grief from loss of limb and frustration with hospital visitation policies. Pt returned to room and remained seated in w/c, alarm set, call bell in reach, and all immediate needs met.   Therapy Documentation Precautions:  Precautions Precautions: Fall Precaution Comments: charcot foot, wound VAC, LLE NWB Required Braces or Orthoses: Other Brace Other Brace: Limb Guard Restrictions Weight Bearing Restrictions: Yes LLE Weight Bearing: Non weight bearing Other Position/Activity Restrictions: limb protector for mobility  Pain: Pain Assessment Pain Scale: 0-10 Pain Score: 0-No pain    Therapy/Group: Individual Therapy  Mellissa Kohut 05/21/2021, 7:44 AM

## 2021-05-22 LAB — GLUCOSE, CAPILLARY
Glucose-Capillary: 195 mg/dL — ABNORMAL HIGH (ref 70–99)
Glucose-Capillary: 190 mg/dL — ABNORMAL HIGH (ref 70–99)
Glucose-Capillary: 137 mg/dL — ABNORMAL HIGH (ref 70–99)
Glucose-Capillary: 242 mg/dL — ABNORMAL HIGH (ref 70–99)

## 2021-05-22 MED ORDER — GABAPENTIN 400 MG PO CAPS
400.0000 mg | ORAL_CAPSULE | Freq: Every day | ORAL | Status: DC
  Administered 2021-05-22: 400 mg via ORAL
  Filled 2021-05-22: qty 1

## 2021-05-22 MED ORDER — MIDODRINE HCL 5 MG PO TABS
10.0000 mg | ORAL_TABLET | ORAL | Status: DC
  Administered 2021-05-24 – 2021-06-07 (×6): 10 mg via ORAL
  Filled 2021-05-22 (×5): qty 2

## 2021-05-22 MED ORDER — INSULIN GLARGINE 100 UNIT/ML ~~LOC~~ SOLN
7.0000 [IU] | Freq: Every day | SUBCUTANEOUS | Status: DC
  Administered 2021-05-22 – 2021-06-02 (×12): 7 [IU] via SUBCUTANEOUS
  Filled 2021-05-22 (×13): qty 0.07

## 2021-05-22 MED ORDER — FUROSEMIDE 40 MG PO TABS
80.0000 mg | ORAL_TABLET | Freq: Two times a day (BID) | ORAL | Status: DC
  Administered 2021-05-22 – 2021-06-08 (×30): 80 mg via ORAL
  Filled 2021-05-22 (×2): qty 2
  Filled 2021-05-22: qty 1
  Filled 2021-05-22 (×2): qty 2
  Filled 2021-05-22: qty 1
  Filled 2021-05-22 (×17): qty 2
  Filled 2021-05-22: qty 1
  Filled 2021-05-22 (×4): qty 2
  Filled 2021-05-22: qty 1
  Filled 2021-05-22 (×2): qty 2
  Filled 2021-05-22: qty 1
  Filled 2021-05-22 (×6): qty 2
  Filled 2021-05-22: qty 1

## 2021-05-22 MED ORDER — MIDODRINE HCL 5 MG PO TABS
ORAL_TABLET | ORAL | Status: AC
  Administered 2021-05-22: 5 mg via ORAL
  Filled 2021-05-22: qty 1

## 2021-05-22 NOTE — Progress Notes (Signed)
Physical Therapy Session Note  Patient Details  Name: Maxwell Harmon MRN: [id number] Date of Birth: 07-Feb-1956  Today's Date: 05/22/2021 PT Individual Time: 0800-0900; 1100-1130 PT Individual Time Calculation (min): 60 min and 30 min PT Missed Time: 30 min Missed Time Reason: patient fatigue  Short Term Goals: Week 1:  PT Short Term Goal 1 (Week 1): Pt will initiate gait training with LRAD PT Short Term Goal 2 (Week 1): Pt will perform lateral transfer with CGA PT Short Term Goal 3 (Week 1): Pt will be mod I with w/c propulsion up to 150 ft  Skilled Therapeutic Interventions/Progress Updates:    Session 1: Pt received seated in bed, agreeable to PT session. No complaints of pain. Pt reports feeling a little "foggy" this AM from medication as well as notes "jumping" in BUE. Seated in bed to sitting EOB with Supervision, use of bedrail. Assisted pt with donning shoe on R foot and residual limb guard on LLE. Pt with frequent LOB posteriorly while attempting to don limb guard, requires min A for sitting balance. Slide board transfers with min A to/from w/c throughout session, cues for sequencing of transfer. Pt has onset of nausea and vomiting once seated up in wheelchair with emesis, nursing notified. Pt has improvement in symptoms following vomiting. Manual w/c propulsion 2 x 100 ft with use of BUE at Supervision level. Pt requires max cueing for management of w/c parts to setup w/c for transfers this date. Sit to stand x 3 reps to RW from elevated mat table with mod A. Pt with heavy LOB to the L and posteriorly this date, unable to maintain static standing balance without mod to max A, decrease in overall function from previous date. Reviewed care of residual limb including shrinker wearing and limb positioning to avoid contracture. Seated LLE therex: knee ext/LAQ, marches x 10 reps each. Pt requires CGA to prevent LOB posteriorly due to impaired trunk control. Pt left seated in w/c in room with  needs in reach, quick release belt and chair alarm in place at end of session.  Session 2: Pt received seated in bed, agreeable to PT session. Pt reports ongoing fogginess and fatigue from medication he received, MD aware. Per OTA report patient was falling asleep during previous therapy session. Pt agreeable to attempt bed level exercises as fatigue allows. Supine BLE strengthening therex: SLR, hip abd, SAQ x 10 reps each, bridges 2 x 5 reps. Pt reports onset of "wooziness" following bridges, semi-reclined BP 115/78 and symptoms resolve with seated rest break. Pt likely holding his breath during exercise, reviewed breathing techniques with activity. Attempt to have patient sit up to EOB to continue with therex but pt falling asleep and closing his eyes, unable to stay awake to continue to functionally participate in therapy session. Pt left seated in bed with needs in reach, bed alarm in place. Pt missed 30 min of scheduled therapy session due to fatigue.  Therapy Documentation Precautions:  Precautions Precautions: Fall Precaution Comments: charcot foot, wound VAC, LLE NWB Required Braces or Orthoses: Other Brace Other Brace: Limb Guard Restrictions Weight Bearing Restrictions: Yes LLE Weight Bearing: Non weight bearing Other Position/Activity Restrictions: limb protector for mobility    Therapy/Group: Individual Therapy   Excell Seltzer, PT, DPT, CSRS  05/22/2021, 10:11 AM

## 2021-05-22 NOTE — Progress Notes (Signed)
Occupational Therapy Session Note  Patient Details  Name: Maxwell Harmon MRN: [id number] Date of Birth: 10/16/1956  Today's Date: 05/22/2021 OT Individual Time: 9675-9163 OT Individual Time Calculation (min): 45 min  and Today's Date: 05/22/2021 OT Missed Time: 15 Minutes Missed Time Reason: Other (comment) (increased lethargy and unable to keep eyes open)   Short Term Goals: Week 1:  OT Short Term Goal 1 (Week 1): Pt will complete LB dressing with min A and AE prn. OT Short Term Goal 2 (Week 1): Pt will complete functional transfers to all appropriate surfaces with CGA and LRAD. OT Short Term Goal 3 (Week 1): Pt will complete 3/3 aspects of toileting with min A. OT Short Term Goal 4 (Week 1): Pt will groom standing sink side with CGA with no more than one rest break  for improved standing tolerance for ADLs.  Skilled Therapeutic Interventions/Progress Updates:    Pt sleeping in w/c upon arrival but easily aroused. PT with difficulty keeping eyes open throughout session.  Pt aware of lethargy and has good recall of earlier therapy session and discussion with MD. Did not attempt SB transfers or sit<>stand secondary to pt's ongoing lethargy. Pt stated he slept well during the night. DABSC located and placed in room.  Discussed home setup. Pt has walk-in shower with seat. Pt voiced concern that he couldn't keep his eyes open. Pt remained in w/c with all needs within reach and belt alarm activated. Pt missed 15 mins skilled OT services.   Therapy Documentation Precautions:  Precautions Precautions: Fall Precaution Comments: charcot foot, wound VAC, LLE NWB Required Braces or Orthoses: Other Brace Other Brace: Limb Guard Restrictions Weight Bearing Restrictions: Yes LLE Weight Bearing: Non weight bearing Other Position/Activity Restrictions: limb protector for mobility General: General OT Amount of Missed Time: 15 Minutes   Pain: Pain Assessment Pain Scale: 0-10 Pain Score: 0-No  pain  Therapy/Group: Individual Therapy  Leroy Libman 05/22/2021, 10:26 AM

## 2021-05-22 NOTE — Progress Notes (Signed)
PROGRESS NOTE   Subjective/Complaints:     ROS:  Pt denies SOB, abd pain, CP, N/V/C/D, and vision changes   Objective:   DG CHEST PORT 1 VIEW  Result Date: 05/20/2021 CLINICAL DATA:  Cough and congestion. EXAM: PORTABLE CHEST 1 VIEW COMPARISON:  May 14, 2021 FINDINGS: Postsurgical changes from CABG. Calcific atherosclerotic disease and tortuosity of the aorta. Enlarged cardiac silhouette. Persistent streaky airspace opacities in the bilateral lung bases, left greater than right. Osseous structures are without acute abnormality. Soft tissues are grossly normal. IMPRESSION: 1. Persistent streaky airspace opacities in the bilateral lung bases, left greater than right. These may represent peribronchial infiltrates, atelectasis or early interstitial pulmonary edema. 2. Enlarged cardiac silhouette. Electronically Signed   By: Fidela Salisbury M.D.   On: 05/20/2021 12:42   No results for input(s): WBC, HGB, HCT, PLT in the last 72 hours.  No results for input(s): NA, K, CL, CO2, GLUCOSE, BUN, CREATININE, CALCIUM in the last 72 hours.   Intake/Output Summary (Last 24 hours) at 05/22/2021 0919 Last data filed at 05/22/2021 0758 Gross per 24 hour  Intake 893.96 ml  Output 650 ml  Net 243.96 ml         Physical Exam: Vital Signs Blood pressure 107/70, pulse 84, temperature 97.7 F (36.5 C), resp. rate 18, height 5\' 11"  (1.803 m), weight 91.9 kg, SpO2 97 %.  General: No acute distress Mood and affect are appropriate Heart: Regular rate and rhythm no rubs murmurs or extra sounds Lungs: Clear to auscultation, breathing unlabored, no rales or wheezes Abdomen: Positive bowel sounds, soft nontender to palpation, nondistended Extremities: No clubbing, cyanosis, or edema Skin: No evidence of breakdown, no evidence of rash    Musculoskeletal:    Cervical back: Normal range of motion and neck supple.    Comments: Right  heel boggy and red. R foot- Charcot food- has no arch- no actual skin broken down- scabs R calf scattered   L BKA with VAC in place- no change UE 5/5 B/L in biceps, triceps, WE< grip and FA Les- RLE- HF 4+/5, KF/KE 4/5, DF and PF 4+/5 LLE- HF and KE at least 3+/5- painful to do ROM Shaping OK- has shrinker over VAC- bulbous at end  Skin: L BKA- a little yellowish/serous drainage- appears ot have a popped blister and 1 that's intact medial aspect of BKA- shaping well- localized erythema, but no increased warmth.     Comments: IV R forearm LUE fistula- can feel thrill Scattered scabs on R calf/shin Fallen arch on R foot/charcot foot R heel boggy  Neurological:    Mental Status: He is alert and oriented to person, place, and time.    Comments: Decreased to absent Sensation to light touch from R knee downwards  RUE twitching occ.    Assessment/Plan: 1. Functional deficits which require 3+ hours per day of interdisciplinary therapy in a comprehensive inpatient rehab setting. Physiatrist is providing close team supervision and 24 hour management of active medical problems listed below. Physiatrist and rehab team continue to assess barriers to discharge/monitor patient progress toward functional and medical goals  Care Tool:  Bathing    Body  parts bathed by patient: Right arm, Chest, Left arm, Abdomen, Front perineal area, Face   Body parts bathed by helper: Right lower leg Body parts n/a: Left lower leg   Bathing assist Assist Level: Moderate Assistance - Patient 50 - 74%     Upper Body Dressing/Undressing Upper body dressing   What is the patient wearing?: Pull over shirt    Upper body assist Assist Level: Set up assist    Lower Body Dressing/Undressing Lower body dressing      What is the patient wearing?: Pants     Lower body assist Assist for lower body dressing: Maximal Assistance - Patient 25 - 49%     Toileting Toileting    Toileting assist Assist for  toileting: Maximal Assistance - Patient 25 - 49%     Transfers Chair/bed transfer  Transfers assist     Chair/bed transfer assist level: Moderate Assistance - Patient 50 - 74% Chair/bed transfer assistive device: Sliding board   Locomotion Ambulation   Ambulation assist   Ambulation activity did not occur: Safety/medical concerns          Walk 10 feet activity   Assist  Walk 10 feet activity did not occur: Safety/medical concerns        Walk 50 feet activity   Assist Walk 50 feet with 2 turns activity did not occur: Safety/medical concerns         Walk 150 feet activity   Assist Walk 150 feet activity did not occur: Safety/medical concerns         Walk 10 feet on uneven surface  activity   Assist Walk 10 feet on uneven surfaces activity did not occur: Safety/medical concerns         Wheelchair     Assist Will patient use wheelchair at discharge?: Yes Type of Wheelchair: Manual    Wheelchair assist level: Supervision/Verbal cueing Max wheelchair distance: 100'    Wheelchair 50 feet with 2 turns activity    Assist        Assist Level: Supervision/Verbal cueing   Wheelchair 150 feet activity     Assist  Wheelchair 150 feet activity did not occur: Safety/medical concerns       Blood pressure 107/70, pulse 84, temperature 97.7 F (36.5 C), resp. rate 18, height 5\' 11"  (1.803 m), weight 91.9 kg, SpO2 97 %.  Medical Problem List and Plan: 1.  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  -con't PT and OT/[name]- will ask for mirror therapy! 2.  Antithrombotics: -DVT/anticoagulation:  Pharmaceutical: Heparin             -antiplatelet therapy: ASA/Plavix 3. Pain Management: Change dilaudid to oxycodone prn.             --resume gabapentin to  help manage phantom pain.  7/16- reduce Oxy to 2.5 to 5 mg q4 hours prn for pain and ocn't tramadol as well  7/17- pain still an issue- nerve pain and residual limb- on max dose of Gabapentin- will ask therapy to do mirror therapy.  7/18 no pain but very groggy and having tremors , reduce gabapentin to 400mg   4. Mood:  LCSW to follow for evaluation and support.               -antipsychotic agents: N/A 5. Neuropsych: This patient is capable of making decisions on his own behalf. 6. Skin/Wound Care: Continue wound VAC             --On Zinc and Juven to help promote wound healing.  7/16- remove VAC- is due today-   7/17- has 1 blister, but looks OK- will monitor 7. Fluids/Electrolytes/Nutrition: Strict I/O with daily weights. 8. R -charcot foot: Encourage wearing CAM boot with activity. 9. T2DM: Hgb A1C-8.2. Monitor BS ac/hs             --continue Lantus 5 units with SSI for elevated BS  CBG (last 3)  Recent Labs    05/21/21 1701 05/21/21 2118 05/22/21 0618  GLUCAP 161* 206* 195*  Increase lantus to 7U   10. ESRD: HD MWF at the end of the day to facilitate therapy and endurance issues             --No plans to resume PD. Midodrine for BP support in hemo. 11. CAD w/recent NSTEMI: Treated medically w/ASA, Plavix and Lipitor.   12. Acute on chronic anemia: Loaded with IV ferric gluconate --continue [name] 150 mc weekly with serial CBC to monitor for stability . 13. PAD: On trental, plavix and ASA. 14. Chronic constipation: Continue Sorbitol bid.  7/16- LBM Thursday- gave an addiitonal dose of Sorbitol since cannot give Mg citrate  7/17- LBM overnight- was hard- will con't Sorbitol BID- might need more as time goes on.  15. Hyperparathyroidism: Being managed with aruxia and Calcitriol.  16. Insomnia/RLS: Resume gabapentin for phantom pain as well as RLS.             --Klonopin prn for sleep  7/17- cannot use trazodone for sleep- causes urinary retention/makes it worse- will d/c prn  trazodone- con't klonopin and add Melatonin- pt said not real helpful, but maybe with klonpin will help. .  17 H/o  Depression/anxiety: Continue Lexapro (started a few weeks ago) and klonopin.  7/16- will decrease Klonopin to 0.25 mg BID prn since too sleepy/dazed this AM  7/17- d/c lexapro per pt request.  18. Cough 7/16- will check CXR-  shows likely atelectasis since WBC is normal and afebrile.      19. Restart meds for hyperparathyroidism-  52. Oliguria/Urinary retention  7/17- required cath for 500cc overnight; will ask to bladder scan q8 hours- usually voided 1-2x/day per pt. Don't want him getting UTI.     Got approval from Pharmacy to increase gabapentin to 600 mg QHS- will do so to help with sleeping and nerve pain. Pt sleeping well but very groggy during the day , also having UE tremors , will reduce to 400mg  tonite   LOS: 4 days A FACE TO FACE EVALUATION WAS PERFORMED  Charlett Blake 05/22/2021, 9:19 AM

## 2021-05-22 NOTE — Procedures (Signed)
Patient was seen on dialysis and the procedure was supervised.  BFR 400  Via AVF BP is  86/49.   Patient appears to be tolerating treatment well  Louis Meckel 05/22/2021

## 2021-05-22 NOTE — Progress Notes (Signed)
[facility] Progress Note    Assessment/ Plan:   ESRD: has been on HD due to issues with CCPD: inadequate drainage/UF (catheter malfunction?) Outpatient orders: [place], MWF, F180, 3hrs, 2k, 2.5cal, 137 na, 35bicarb, 400bfr, 16g, edw 86.5kg. hep 2k bolus -discussed with his outpatient home therapies RN 7/5: no plans for further PD and is being scheduled to have PD cath removed as an outpatient, therefore no PD cath flushing needed.  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 [name]. [name]    Volume/ hypertension: EDW 86.5kg pre amputation. Over EDW and with possible pulm edema - push UF as able but also hypotensive.   Makes urine-  will dose with lasix   Anemia of Chronic Kidney Disease: Hemoglobin 8.0. No active infection so gave 0.5g IV iron load over 4 HD treatments. Receives mircera 150 mcg, last dose 6/24, ordered aranesp here. Transfuse prn   Secondary Hyperparathyroidism/Hyperphosphatemia: decreased auryxia dose with phos running on low side, renal diet, monitor phos-  also on calcitriol, sensipar   DM2 w/ hyperglycemia -mgmt per primary service  Hypoalbuminemia, protein calorie malnutrition -push protein    Feeling out of it-  on prm pain and nausea meds-  may need less of those   [facility]    Subjective:   Very sleepy in HD today  "I feel off" also with low BP on treatment [name] going fine.    Objective:   BP (!) 86/49 (BP Location: Right Arm)   Pulse 77   Temp 98 F (36.7 C) (Oral)   Resp 16   Ht 5\' 11"  (1.803 m)   Wt 92.9 kg   SpO2 94%   BMI 28.56 kg/m   Intake/Output Summary (Last 24 hours) at 05/22/2021 1440 Last data filed at 05/22/2021 1300 Gross per 24 hour  Intake 893.96 ml  Output 789 ml  Net 104.96 ml    Weight change:   Physical Exam: Gen:nad, nontoxic appearing CVS:rrr Resp:normal wob on RA, a few basilar rales BPZ:WCHEN RLE 1+ edema, LLE amputated with immobilizer on Neuro: sleeping Access: avf +b/t   Imaging: No results found.  Labs: BMET Recent Labs  Lab 05/17/21 0728 05/19/21 0533  NA 133* 131*  K 4.4 4.4  CL 98 95*  CO2 28 30  GLUCOSE 169* 165*  BUN 62* 56*  CREATININE 5.80* 5.24*  CALCIUM 9.0 8.8*  PHOS 2.8  --    CBC Recent Labs  Lab 05/17/21 0309 05/19/21 0533  WBC 10.2 8.2  HGB 8.2* 8.0*  HCT 26.6* 25.9*  MCV 100.4* 101.6*  PLT 192 189    Medications:     aspirin EC  81 mg Oral Daily   atorvastatin  80 mg Oral Daily   calcitRIOL  0.25 mcg Oral BID   Chlorhexidine Gluconate Cloth  6 each Topical Q0600   cinacalcet  30 mg Oral Q Mon   clopidogrel  75 mg Oral Daily   [START ON 05/24/2021] darbepoetin ([name]) injection - DIALYSIS  150 mcg Intravenous Q Wed-HD   feeding supplement (NEPRO CARB STEADY)  237 mL Oral QHS   ferric citrate  420 mg Oral TID with meals   gabapentin  400 mg Oral QHS   gentamicin cream   Topical Daily   heparin  5,000 Units Subcutaneous  Q8H   insulin aspart  0-6 Units Subcutaneous TID WC   insulin glargine  7 Units Subcutaneous QHS   melatonin  3 mg Oral QHS   midodrine  5 mg Oral Q M,W,F-HD   multivitamin  1 tablet Oral QHS   nutrition supplement (JUVEN)  1 packet Oral BID BM   pantoprazole  40 mg Oral Daily   pentoxifylline  400 mg Oral TID WC   sorbitol  30 mL Oral BID   tamsulosin  0.4 mg Oral QHS   zinc sulfate  220 mg Oral Daily

## 2021-05-23 ENCOUNTER — Encounter (HOSPITAL_COMMUNITY): Payer: Medicare Other

## 2021-05-23 ENCOUNTER — Encounter (HOSPITAL_COMMUNITY): Payer: Managed Care, Other (non HMO)

## 2021-05-23 LAB — GLUCOSE, CAPILLARY
Glucose-Capillary: 127 mg/dL — ABNORMAL HIGH (ref 70–99)
Glucose-Capillary: 183 mg/dL — ABNORMAL HIGH (ref 70–99)
Glucose-Capillary: 147 mg/dL — ABNORMAL HIGH (ref 70–99)
Glucose-Capillary: 256 mg/dL — ABNORMAL HIGH (ref 70–99)

## 2021-05-23 MED ORDER — GABAPENTIN 100 MG PO CAPS
200.0000 mg | ORAL_CAPSULE | Freq: Every day | ORAL | Status: DC
  Administered 2021-05-23 – 2021-06-01 (×10): 200 mg via ORAL
  Filled 2021-05-23 (×10): qty 2

## 2021-05-23 MED ORDER — CHLORHEXIDINE GLUCONATE CLOTH 2 % EX PADS
6.0000 | MEDICATED_PAD | Freq: Every day | CUTANEOUS | Status: DC
  Administered 2021-05-25 – 2021-05-28 (×4): 6 via TOPICAL

## 2021-05-23 MED ORDER — ZOLPIDEM TARTRATE 5 MG PO TABS
5.0000 mg | ORAL_TABLET | Freq: Every day | ORAL | Status: DC
  Administered 2021-05-23 – 2021-05-26 (×4): 5 mg via ORAL
  Filled 2021-05-23 (×4): qty 1

## 2021-05-23 MED ORDER — LINACLOTIDE 145 MCG PO CAPS
145.0000 ug | ORAL_CAPSULE | Freq: Every day | ORAL | Status: DC
  Administered 2021-05-24 – 2021-06-01 (×8): 145 ug via ORAL
  Filled 2021-05-23 (×9): qty 1

## 2021-05-23 NOTE — Progress Notes (Signed)
[facility] Progress Note    Assessment/ Plan:   ESRD: has been on HD due to issues with CCPD: inadequate drainage/UF (catheter malfunction?) Outpatient orders: [place], MWF, F180, 3hrs, 2k, 2.5cal, 137 na, 35bicarb, 400bfr, 16g, edw 86.5kg. hep 2k bolus -discussed with his outpatient home therapies RN 7/5: no plans for further PD and is being scheduled to have PD cath removed as an outpatient, therefore no PD cath flushing needed.  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 [name]. [name]    Volume/ hypertension: EDW 86.5kg pre amputation. Over EDW and with possible pulm edema - push UF as able but also hypotensive-  profile-  low temp and midodrine pre HD.   Makes urine-  will dose with lasix-  now some issues with urinary retention-  to be placed on flomax   Anemia of Chronic Kidney Disease: Hemoglobin 8.0. No active infection so gave 0.5g IV iron load over 4 HD treatments. Receives mircera 150 mcg, last dose 6/24, ordered aranesp here. Transfuse prn   Secondary Hyperparathyroidism/Hyperphosphatemia: decreased auryxia dose with phos running on low side, renal diet, monitor phos-  also on calcitriol, sensipar   DM2 w/ hyperglycemia -mgmt per primary service  Hypoalbuminemia, protein calorie malnutrition -push protein    Feeling out of it-  on prm pain and nausea meds-  may need less of those-  is better    [facility]    Subjective:   S/p HD yest-  removed 3200-  tolerated well-  has had neurontin decreased-  having some urinary retention    Objective:   BP (!) 90/56 (BP Location: Right Arm)   Pulse 87   Temp 98.2 F (36.8 C) (Oral)   Resp 18   Ht 5\' 11"  (1.803 m)   Wt 89.6 kg   SpO2 91%   BMI 27.55 kg/m   Intake/Output  Summary (Last 24 hours) at 05/23/2021 1325 Last data filed at 05/23/2021 0840 Gross per 24 hour  Intake 180 ml  Output 3300 ml  Net -3120 ml   Weight change:   Physical Exam: Gen:nad, nontoxic appearing CVS:rrr Resp:normal wob on RA, a few basilar rales FWY:OVZCH RLE 1+ edema, LLE amputated with immobilizer on Neuro: sleeping Access: avf +b/t   Imaging: No results found.  Labs: BMET Recent Labs  Lab 05/17/21 0728 05/19/21 0533  NA 133* 131*  K 4.4 4.4  CL 98 95*  CO2 28 30  GLUCOSE 169* 165*  BUN 62* 56*  CREATININE 5.80* 5.24*  CALCIUM 9.0 8.8*  PHOS 2.8  --    CBC Recent Labs  Lab 05/17/21 0309 05/19/21 0533  WBC 10.2 8.2  HGB 8.2* 8.0*  HCT 26.6* 25.9*  MCV 100.4* 101.6*  PLT 192 189    Medications:     aspirin EC  81 mg Oral Daily   atorvastatin  80 mg Oral Daily   calcitRIOL  0.25 mcg Oral BID   Chlorhexidine Gluconate Cloth  6 each Topical Q0600   cinacalcet  30 mg Oral Q Mon   clopidogrel  75 mg Oral Daily   [START ON 05/24/2021] darbepoetin ([name]) injection - DIALYSIS  150 mcg Intravenous Q Wed-HD   feeding supplement (NEPRO CARB STEADY)  237 mL Oral QHS   ferric citrate  420 mg  Oral TID with meals   furosemide  80 mg Oral BID   gabapentin  200 mg Oral QHS   gentamicin cream   Topical Daily   heparin  5,000 Units Subcutaneous Q8H   insulin aspart  0-6 Units Subcutaneous TID WC   insulin glargine  7 Units Subcutaneous QHS   [START ON 05/24/2021] linaclotide  145 mcg Oral QAC breakfast   [START ON 05/24/2021] midodrine  10 mg Oral Q M,W,F-HD   multivitamin  1 tablet Oral QHS   nutrition supplement (JUVEN)  1 packet Oral BID BM   pantoprazole  40 mg Oral Daily   pentoxifylline  400 mg Oral TID WC   sorbitol  30 mL Oral BID   tamsulosin  0.4 mg Oral QHS   zinc sulfate  220 mg Oral Daily   zolpidem  5 mg Oral QHS

## 2021-05-23 NOTE — Progress Notes (Signed)
PROGRESS NOTE   Subjective/Complaints:  Pt reports feeling better/less "out of it" today- yesterday was bad since was so sleepy- likely due to increase in Gabapentin which was done to help nerve pain- back down to 400 mg QHS. LBM yesterday- was hard. Got some miralax as well. On Sorbitol BID right now- wasn't on that at home, likely pain meds causing it to be worse.   Poor sleep last night- didn't go to sleep til 4am- likely due to sleeping all day and lack of sleeping med?  Never tried Ambien before.    ROS:   Pt denies SOB, abd pain, CP, N/V/C/D, and vision changes  Objective:   No results found. No results for input(s): WBC, HGB, HCT, PLT in the last 72 hours.  No results for input(s): NA, K, CL, CO2, GLUCOSE, BUN, CREATININE, CALCIUM in the last 72 hours.   Intake/Output Summary (Last 24 hours) at 05/23/2021 1028 Last data filed at 05/23/2021 0840 Gross per 24 hour  Intake 420 ml  Output 3439 ml  Net -3019 ml        Physical Exam: Vital Signs Blood pressure (!) 90/56, pulse 87, temperature 98.2 F (36.8 C), temperature source Oral, resp. rate 18, height 5\' 11"  (1.803 m), weight 89.6 kg, SpO2 91 %.    General: awake, alert, appropriate, still a little slowed/dazed, but better; in ortho gym with OT; NAD HENT: conjugate gaze; oropharynx moist CV: regular rate; no JVD Pulmonary: CTA B/L; no W/R/R- good air movement GI: soft, NT, ND, (+)BS- normoactive Psychiatric: appropriate- still slightly slowed responses Neurological: alert   Musculoskeletal:    Cervical back: Normal range of motion and neck supple.    Comments: Right heel boggy and red. R foot- Charcot food- has no arch- no actual skin broken down- scabs R calf scattered   L BKA with VAC in place- no change UE 5/5 B/L in biceps, triceps, WE< grip and FA Les- RLE- HF 4+/5, KF/KE 4/5, DF and PF 4+/5 LLE- HF and KE at least 3+/5- painful to do  ROM Shaping OK- has shrinker on with limb guard over it- C/D/I Skin: L BKA- a little yellowish/serous drainage- appears ot have a popped blister and 1 that's intact medial aspect of BKA- shaping well- localized erythema, but no increased warmth.     Comments: IV R forearm LUE fistula- can feel thrill Scattered scabs on R calf/shin Fallen arch on R foot/charcot foot R heel boggy  Neurological:    Mental Status: He is alert and oriented to person, place, and time.    Comments: Decreased to absent Sensation to light touch from R knee downwards  RUE twitching occ.    Assessment/Plan: 1. Functional deficits which require 3+ hours per day of interdisciplinary therapy in a comprehensive inpatient rehab setting. Physiatrist is providing close team supervision and 24 hour management of active medical problems listed below. Physiatrist and rehab team continue to assess barriers to discharge/monitor patient progress toward functional and medical goals  Care Tool:  Bathing    Body parts bathed by patient: Right arm, Chest, Left arm, Abdomen, Front perineal area, Face   Body parts bathed by  helper: Right lower leg Body parts n/a: Left lower leg   Bathing assist Assist Level: Moderate Assistance - Patient 50 - 74%     Upper Body Dressing/Undressing Upper body dressing   What is the patient wearing?: Pull over shirt    Upper body assist Assist Level: Set up assist    Lower Body Dressing/Undressing Lower body dressing      What is the patient wearing?: Pants     Lower body assist Assist for lower body dressing: Maximal Assistance - Patient 25 - 49%     Toileting Toileting    Toileting assist Assist for toileting: Maximal Assistance - Patient 25 - 49%     Transfers Chair/bed transfer  Transfers assist     Chair/bed transfer assist level: Minimal Assistance - Patient > 75% Chair/bed transfer assistive device: Sliding board   Locomotion Ambulation   Ambulation  assist   Ambulation activity did not occur: Safety/medical concerns          Walk 10 feet activity   Assist  Walk 10 feet activity did not occur: Safety/medical concerns        Walk 50 feet activity   Assist Walk 50 feet with 2 turns activity did not occur: Safety/medical concerns         Walk 150 feet activity   Assist Walk 150 feet activity did not occur: Safety/medical concerns         Walk 10 feet on uneven surface  activity   Assist Walk 10 feet on uneven surfaces activity did not occur: Safety/medical concerns         Wheelchair     Assist Will patient use wheelchair at discharge?: Yes Type of Wheelchair: Manual    Wheelchair assist level: Supervision/Verbal cueing Max wheelchair distance: 100'    Wheelchair 50 feet with 2 turns activity    Assist        Assist Level: Supervision/Verbal cueing   Wheelchair 150 feet activity     Assist  Wheelchair 150 feet activity did not occur: Safety/medical concerns       Blood pressure (!) 90/56, pulse 87, temperature 98.2 F (36.8 C), temperature source Oral, resp. rate 18, height 5\' 11"  (1.803 m), weight 89.6 kg, SpO2 91 %.  Medical Problem List and Plan: 1.  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  -con't PT and OT/[name]- will ask for mirror therapy!  -con't PT and OT 2.  Antithrombotics: -DVT/anticoagulation:  Pharmaceutical: Heparin             -antiplatelet therapy: ASA/Plavix 3. Pain Management: Change dilaudid to oxycodone prn.             --resume gabapentin to help manage phantom pain.  7/16- reduce Oxy to 2.5 to 5 mg q4 hours prn for pain and ocn't tramadol as well  7/17- pain still an issue- nerve pain and residual limb- on max dose of Gabapentin- will ask therapy to do mirror  therapy.  7/18 no pain but very groggy and having tremors , reduce gabapentin to 400mg    7/19- pt reports pain is "adequate"- con't regimen where it is 4. Mood:  LCSW to follow for evaluation and support.               -antipsychotic agents: N/A 5. Neuropsych: This patient is capable of making decisions on his own behalf. 6. Skin/Wound Care: Continue wound VAC             --On Zinc and Juven to help promote wound healing.  7/16- remove VAC- is due today-   7/17- has 1 blister, but looks OK- will monitor 7. Fluids/Electrolytes/Nutrition: Strict I/O with daily weights. 8. R -charcot foot: Encourage wearing CAM boot with activity. 9. T2DM: Hgb A1C-8.2. Monitor BS ac/hs             --continue Lantus 5 units with SSI for elevated BS  CBG (last 3)  Recent Labs    05/22/21 1748 05/22/21 2048 05/23/21 0617  GLUCAP 137* 242* 127*  7/19- just has Lantus increased- so although had 1 elevated BG, will wait to change for 1 day 10. ESRD: HD MWF at the end of the day to facilitate therapy and endurance issues             --No plans to resume PD. Midodrine for BP support in hemo. 11. CAD w/recent NSTEMI: Treated medically w/ASA, Plavix and Lipitor.   12. Acute on chronic anemia: Loaded with IV ferric gluconate --continue [name] 150 mc weekly with serial CBC to monitor for stability . 13. PAD: On trental, plavix and ASA. 14. Chronic constipation: Continue Sorbitol bid.  7/16- LBM Thursday- gave an addiitonal dose of Sorbitol since cannot give Mg citrate  7/17- LBM overnight- was hard- will con't Sorbitol BID- might need more as time goes on.   7/19- will add Linzess 145 mcg daily- starting tomorrow 15. Hyperparathyroidism: Being managed with aruxia and Calcitriol.  16. Insomnia/RLS: Resume gabapentin for phantom pain as well as RLS.             --Klonopin prn for sleep  7/17- cannot use trazodone for sleep- causes urinary retention/makes it worse- will d/c prn trazodone- con't klonopin and add  Melatonin- pt said not real helpful, but maybe with klonpin will help. .   7/19- slept poorly- will try Ambien 5 mg QHS- but no higher. Will monitor 17 H/o  Depression/anxiety: Continue Lexapro (started a few weeks ago) and klonopin.  7/16- will decrease Klonopin to 0.25 mg BID prn since too sleepy/dazed this AM  7/17- d/c lexapro per pt request.  18. Cough 7/16- will check CXR-  shows likely atelectasis since WBC is normal and afebrile.      19. Restart meds for hyperparathyroidism-  25. Oliguria/Urinary retention  7/17- required cath for 500cc overnight; will ask to bladder scan q8 hours- usually voided 1-2x/day per pt. Don't want him getting UTI.      LOS: 5 days A FACE TO FACE EVALUATION WAS PERFORMED  Maxwell Harmon 05/23/2021, 10:28 AM

## 2021-05-23 NOTE — Progress Notes (Signed)
Physical Therapy Session Note  Patient Details  Name: ORMAN MATSUMURA MRN: [id number] Date of Birth: [birth year]  Today's Date: 05/23/2021 PT Individual Time: 0900-0955 PT Individual Time Calculation (min): 55 min   Short Term Goals: Week 1:  PT Short Term Goal 1 (Week 1): Pt will initiate gait training with LRAD PT Short Term Goal 2 (Week 1): Pt will perform lateral transfer with CGA PT Short Term Goal 3 (Week 1): Pt will be mod I with w/c propulsion up to 150 ft  Skilled Therapeutic Interventions/Progress Updates:    Pt received seated in w/c in room, agreeable to PT session. Pt reports improvement in "fogginess" from previous date but does continue to feel somewhat "foggy". Pt reports some pain in L residual limb, not rated and declines intervention. Manual w/c propulsion to/from therapy gym at Supervision level with use of BUE. Slide board transfer to/from mat table with min A with increased time and cues needed to complete transfer and to sequence transfer. Seated press-ups with use of yoga blocks on EOB x 5 reps with cues for anterior lean. Seated mini-crunches 2 x 10 reps from therapy ball at Supervision level with cues for core activation and to not utilized BUE to perform exercise. Seated EOB to supine to long-sitting on mat table with min A needed for some trunk control. Pt able to perform long-sitting HS stretch x 30 sec before onset of R thigh cramping and returns to supine for comfort. Supine LLE therex x 10 reps: SLR, hip abduction. Supine to sitting EOB with min A for trunk control. Pt returned to w/c and returned to room. Pt left seated in w/c in room with needs in reach, quick release belt and chair alarm in place at end of session.  Therapy Documentation Precautions:  Precautions Precautions: Fall Precaution Comments: charcot foot, wound VAC, LLE NWB Required Braces or Orthoses: Other Brace Other Brace: Limb Guard Restrictions Weight Bearing Restrictions: No LLE Weight  Bearing: Non weight bearing Other Position/Activity Restrictions: limb protector for mobility    Therapy/Group: Individual Therapy   Excell Seltzer, PT, DPT, CSRS  05/23/2021, 4:59 PM

## 2021-05-23 NOTE — Progress Notes (Signed)
Occupational Therapy Session Note  Patient Details  Name: Maxwell Harmon MRN: [id number] Date of Birth: 21-Feb-1956  Today's Date: 05/23/2021 OT Individual Time: 0700-0810 OT Individual Time Calculation (min): 70 min    Short Term Goals: Week 1:  OT Short Term Goal 1 (Week 1): Pt will complete LB dressing with min A and AE prn. OT Short Term Goal 2 (Week 1): Pt will complete functional transfers to all appropriate surfaces with CGA and LRAD. OT Short Term Goal 3 (Week 1): Pt will complete 3/3 aspects of toileting with min A. OT Short Term Goal 4 (Week 1): Pt will groom standing sink side with CGA with no more than one rest break  for improved standing tolerance for ADLs.  Skilled Therapeutic Interventions/Progress Updates:    Pt eating breakfast in bed upon arrival. Pt agreeable to getting OOB for grooming tasks at sink. Pt declined bathing and changing clothing this morning. Supine>sit EOB with supervision. SB transfer to w/c with CGA and min verbal cues for sequencing. Pt with delayed reactions and initiation this morning but more alert then previous morning. Grooming at sink and donning shoe and limb guard. Pt required min A for donning shoe and min A for donning limb guard. Pt commented that he didn't get much sleep during the night and he still feels a little foggy this morning. Pt voiced frustration that he has still not cleared. Pt propelled w/c to ortho gym and engaged in BUE therex on [employer] (5 mins level 3.) Pt noted having difficulty maintaining grip on Lt handle. Pt commented that his LUE seemed weaker since starting HD. Pt propelled w/c back to room and remained in w/c with all needs within reach. Belt alarm activated.   Therapy Documentation Precautions:  Precautions Precautions: Fall Precaution Comments: charcot foot, wound VAC, LLE NWB Required Braces or Orthoses: Other Brace Other Brace: Limb Guard Restrictions Weight Bearing Restrictions: No LLE Weight Bearing: Non weight  bearing Other Position/Activity Restrictions: limb protector for mobility  Pain:  Pt denies pain this morning   Therapy/Group: Individual Therapy  Leroy Libman 05/23/2021, 8:18 AM

## 2021-05-23 NOTE — Progress Notes (Signed)
Occupational Therapy Session Note  Patient Details  Name: Maxwell Harmon MRN: [id number] Date of Birth: July 24, 1956  Today's Date: 05/23/2021 OT Individual Time: 6270-3500 OT Individual Time Calculation (min): 61 min    Short Term Goals: Week 1:  OT Short Term Goal 1 (Week 1): Pt will complete LB dressing with min A and AE prn. OT Short Term Goal 2 (Week 1): Pt will complete functional transfers to all appropriate surfaces with CGA and LRAD. OT Short Term Goal 3 (Week 1): Pt will complete 3/3 aspects of toileting with min A. OT Short Term Goal 4 (Week 1): Pt will groom standing sink side with CGA with no more than one rest break  for improved standing tolerance for ADLs.  Skilled Therapeutic Interventions/Progress Updates:  Pt received seated in w/c upon arrival with pts wife present. Pt reports fatigue but agreeable to OT intervention. Pt able to complete w/c propulsion to gym with supervision. Pt completed SB transfer to mat table with MIN A. Pt completed x20 reps of bicep curls with BUEs from EOM with 4 lb hand weight, x20 reps of scapular protraction<>retraction with RUE with 4lb weight however pt able to tolerate x10 reps on LUE. Pt completed modified tricep dips with BUEs positioned behind with pt able to complete x20 reps, pt completed modified crunches with therapy ball positioned behind pt with pt able to complete x20 reps of crunches with gross supervision. Pt completed x3 sit<>stands from EOM with use of mirror to provided visual feedback to improve upright posture and appropriate body mechanics with MIN A but progressed to MOD A as pt fatigued. Pt completed seated RLE strengthening exercises including x20 reps of LAQ with level 1 theraband and partial stands from EOM with MODA, pt with difficulty transitioning into half stands. Pt reports tightness in R calf, provided manual massage as pain mgmt strategy with pt reporting improvement, additionally provided calf stretch from EOM. Worked on  lateral leans from EOM with pt able to lateral lean to pts L side to place/ remove bean bags underneath buttock to simulate compensatory methods for pericare from drop arm BSC. Pt completed SB transfer back to w/c in similar fashion as previously indicated, where pt completed w/c propulsion back to room. Pt left supine in bed with bed alarm activated and all needs within reach.   Therapy Documentation Precautions:  Precautions Precautions: Fall Precaution Comments: charcot foot, wound VAC, LLE NWB Required Braces or Orthoses: Other Brace Other Brace: Limb Guard Restrictions Weight Bearing Restrictions: No LLE Weight Bearing: Non weight bearing Other Position/Activity Restrictions: limb protector for mobility  Pain:  Pt reports pain from tightness in RLE, provided stretches, rest breaks, and repositioning as pain mgmt strategies.    Therapy/Group: Individual Therapy  Corinne Ports [facility] 05/23/2021, 4:05 PM

## 2021-05-23 NOTE — Progress Notes (Signed)
Patient ID: [name], male   DOB: Aug 10, 1956, 65 y.o.   MRN: [id number]  SW spoke with Greg/Dialysis Coord [phone]) to inform on pt admission, and d/c date 7/29. SW also updated per EMR that pt will not transition back to PD, and will be scheduled to have PD cath removed. He will f/u if there are any issues.   SW met with pt and pt wife in room to provide updates from team conference, pt being supervision at discharge, and d/c date 7/29. SW discussed discharge plan. Reports that she works 3 days per week, and has a flexible schedule since she is self-employed. Pt and wife would like to know how soon will pt can get prosthesis. Encouraged him to speak with physician but will pass along question. SW will continue to provide updates.   Physical Address: [address], [place] [address]  Loralee Pacas, MSW, [place] Office: [phone] Cell: [phone] Fax: [phone]

## 2021-05-23 NOTE — Patient Care Conference (Signed)
Inpatient RehabilitationTeam Conference and Plan of Care Update Date: 05/23/2021   Time: 11:07 AM    Patient Name: Maxwell Harmon      Medical Record Number: [id number]  Date of Birth: 01/28/1956 Sex: Male         Room/Bed: 4M12C/4M12C-01 Payor Info: Payor: MEDICARE / Plan: MEDICARE PART A AND B / Product Type: *No Product type* /    Admit Date/Time:  05/18/2021  9:49 PM  Primary Diagnosis:  Below-knee amputation of left lower extremity [facility])  Hospital Problems: Principal Problem:   Below-knee amputation of left lower extremity ([place]) Active Problems:   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[place])   ESRD on hemodialysis ([place])   Acute on chronic systolic (congestive) heart failure [facility])    Expected Discharge Date: Expected Discharge Date: 06/02/21  Team Members Present: Physician leading conference: Dr. Courtney Heys Care Coodinator Present: Loralee Pacas, LCSWA;Ciara Kagan Creig Hines, RN, BSN, CRRN Nurse Present: Dorthula Nettles, RN PT Present: Excell Seltzer, PT OT Present: Roanna Epley, COTA;Jennifer Tamala Julian, OT PPS Coordinator present : Gunnar Fusi, SLP     Current Status/Progress Goal Weekly Team Focus  Bowel/Bladder             Swallow/Nutrition/ Hydration             ADL's   bathing-mod A; LB dressing-max A; toileting-max A; SB tranfsers-min A; sit<>stand with mod A; ongoing lethargy  supervision overall  BADL training; functional transfers, toileting, activity tolerance, safety awareness   Mobility   CGA bed mobility, min A SB transfers, mod A to stand to RW, Supervision w/c mobility  Supervision overall with transfers and gait (150 ft), mod I w/c mobility, likely will downgrade gait goal pending progress  transfers, sit to stand, amputee education, w/c management   Communication             Safety/Cognition/ Behavioral Observations            Pain             Skin               Discharge Planning:  Pt to d/c to home with his  wife who is primary caregiver.Wife works 3 days per week, however, believes that she can work out additional support for someone to be with patient, and/or take time off. SW will confirm no barriers to d/c.   Team Discussion: Discontinued Melatonin, added Ambien. Added Linzess for constipation. He is delayed in answering. Will decrease Gabapentin more. Patient is oliguric, may need to be cathed only 1 time per day due to low volumes. No complaints of pain, and fistula in left arm. Patient on target to meet rehab goals: yes, contact guard bed mobility, min assist with transfers. Supervision with W/C. Supervision goals. Lower body dressing max assist, stand by transfers needs cueing for safety.   *See Care Plan and progress notes for long and short-term goals.   Revisions to Treatment Plan:  D/C'd Melatonin, added Ambien, added Linzess, decreasing Gabapentin more. Teaching Needs: Family education, medication management, pain management, skin/wound care, bowel/bladder management, transfer training, gait training, W/C training, balance training, endurance training, weight bearing education, safety awareness.  Current Barriers to Discharge: Decreased caregiver support, Medical stability, Home enviroment access/layout, Neurogenic bowel and bladder, Wound care, Lack of/limited family support, Weight, Hemodialysis, Weight bearing restrictions, Medication compliance, and Behavior  Possible Resolutions to Barriers: Continue current medications, provide emotional support.     Medical Summary Current Status: oliguric-  voids 1-2x/day, but has required caths; pain is controlled with meds; Fistula LUE; L BKA- staples intact  Barriers to Discharge: Weight bearing restrictions;Hemodialysis;Decreased family/caregiver support;Home enviroment access/layout;Medical stability;Neurogenic Bowel & Bladder;Wound care;Other (comments)  Barriers to Discharge Comments: d/c home with wife- HD M/W/F Possible Resolutions to  [employer]: not much gait; sedation/foggy/sequencing issues- improved today; S goals; d/c melatonin and add Ambien; decreased gabapentin to 200 mg QHS; add Linzess for bowels.  d/c-   Continued Need for Acute Rehabilitation Level of Care: The patient requires daily medical management by a physician with specialized training in physical medicine and rehabilitation for the following reasons: Direction of a multidisciplinary physical rehabilitation program to maximize functional independence : Yes Medical management of patient stability for increased activity during participation in an intensive rehabilitation regime.: Yes Analysis of laboratory values and/or radiology reports with any subsequent need for medication adjustment and/or medical intervention. : Yes   I attest that I was present, lead the team conference, and concur with the assessment and plan of the team.   Cristi Loron 05/23/2021, 5:30 PM

## 2021-05-24 LAB — CBC
HCT: 27.7 % — ABNORMAL LOW (ref 39.0–52.0)
Hemoglobin: 8.3 g/dL — ABNORMAL LOW (ref 13.0–17.0)
MCH: 31.3 pg (ref 26.0–34.0)
MCHC: 30 g/dL (ref 30.0–36.0)
MCV: 104.5 fL — ABNORMAL HIGH (ref 80.0–100.0)
Platelets: 178 10*3/uL (ref 150–400)
RBC: 2.65 MIL/uL — ABNORMAL LOW (ref 4.22–5.81)
RDW: 19.5 % — ABNORMAL HIGH (ref 11.5–15.5)
WBC: 7.7 10*3/uL (ref 4.0–10.5)
nRBC: 0 % (ref 0.0–0.2)

## 2021-05-24 LAB — GLUCOSE, CAPILLARY
Glucose-Capillary: 163 mg/dL — ABNORMAL HIGH (ref 70–99)
Glucose-Capillary: 111 mg/dL — ABNORMAL HIGH (ref 70–99)
Glucose-Capillary: 103 mg/dL — ABNORMAL HIGH (ref 70–99)
Glucose-Capillary: 241 mg/dL — ABNORMAL HIGH (ref 70–99)

## 2021-05-24 LAB — RENAL FUNCTION PANEL
Albumin: 2.3 g/dL — ABNORMAL LOW (ref 3.5–5.0)
Anion gap: 7 (ref 5–15)
BUN: 67 mg/dL — ABNORMAL HIGH (ref 8–23)
CO2: 28 mmol/L (ref 22–32)
Calcium: 9.1 mg/dL (ref 8.9–10.3)
Chloride: 96 mmol/L — ABNORMAL LOW (ref 98–111)
Creatinine, Ser: 5.31 mg/dL — ABNORMAL HIGH (ref 0.61–1.24)
GFR, Estimated: 11 mL/min — ABNORMAL LOW (ref >60)
Glucose, Bld: 161 mg/dL — ABNORMAL HIGH (ref 70–99)
Phosphorus: 2.5 mg/dL (ref 2.5–4.6)
Potassium: 5.2 mmol/L — ABNORMAL HIGH (ref 3.5–5.1)
Sodium: 131 mmol/L — ABNORMAL LOW (ref 135–145)

## 2021-05-24 MED ORDER — DARBEPOETIN ALFA 150 MCG/0.3ML IJ SOSY
PREFILLED_SYRINGE | INTRAMUSCULAR | Status: AC
  Administered 2021-05-24: 150 ug via INTRAVENOUS
  Filled 2021-05-24: qty 0.3

## 2021-05-24 MED ORDER — HEPARIN SODIUM (PORCINE) 1000 UNIT/ML DIALYSIS: 20.0000 [IU]/kg | INTRAMUSCULAR | Status: DC | PRN

## 2021-05-24 MED ORDER — TAMSULOSIN HCL 0.4 MG PO CAPS
0.8000 mg | ORAL_CAPSULE | Freq: Every day | ORAL | Status: DC
  Administered 2021-05-24 – 2021-06-07 (×15): 0.8 mg via ORAL
  Filled 2021-05-24 (×15): qty 2

## 2021-05-24 MED ORDER — PENTOXIFYLLINE ER 400 MG PO TBCR
400.0000 mg | EXTENDED_RELEASE_TABLET | Freq: Every day | ORAL | Status: DC
  Administered 2021-05-25 – 2021-06-08 (×15): 400 mg via ORAL
  Filled 2021-05-24 (×15): qty 1

## 2021-05-24 MED ORDER — SENNA 8.6 MG PO TABS
1.0000 | ORAL_TABLET | Freq: Two times a day (BID) | ORAL | Status: DC
  Administered 2021-05-24 – 2021-05-25 (×3): 8.6 mg via ORAL
  Filled 2021-05-24 (×3): qty 1

## 2021-05-24 NOTE — Progress Notes (Signed)
Inpatient Rehabilitation Care Coordinator Assessment and Plan Patient Details  Name: TILDON SILVERIA MRN: [id number] Date of Birth: [birth year]  Today's Date: 05/24/2021  Hospital Problems: Principal Problem:   Below-knee amputation of left lower extremity ([place]) Active Problems:   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[place])   ESRD on hemodialysis ([place])   Acute on chronic systolic (congestive) heart failure ([place])  Past Medical History:  Past Medical History:  Diagnosis Date   Anemia of chronic disease    CAD (coronary artery disease)    Diabetes mellitus without complication ([place])    ESRD (end stage renal disease) on dialysis ([place])    MWF in [place]   History of bleeding ulcers    History of TIAs    Hypertension    NSTEMI (non-ST elevated myocardial infarction) ([place]) 04/2021   PAD (peripheral artery disease) ([place])    Past Surgical History:  Past Surgical History:  Procedure Laterality Date   ABDOMINAL AORTOGRAM W/LOWER EXTREMITY Bilateral 04/14/2021   Procedure: ABDOMINAL AORTOGRAM W/LOWER EXTREMITY;  Surgeon: Elam Dutch, MD;  Location: [facility];  Service: Vascular;  Laterality: Bilateral;   AMPUTATION Left 05/12/2021   Procedure: LEFT BELOW KNEE AMPUTATION;  Surgeon: Newt Minion, MD;  Location: [facility];  Service: Orthopedics;  Laterality: Left;   CATARACT EXTRACTION W/PHACO Left 02/15/2014   Procedure: CATARACT EXTRACTION PHACO AND INTRAOCULAR LENS PLACEMENT (IOC);  Surgeon: Tonny Branch, MD;  Location: [facility];  Service: Ophthalmology;  Laterality: Left;  CDE 10.84   CATARACT EXTRACTION W/PHACO Right 12/14/2013   Procedure: CATARACT EXTRACTION PHACO AND INTRAOCULAR LENS PLACEMENT (IOC);  Surgeon: Tonny Branch, MD;  Location: [facility];  Service: Ophthalmology;  Laterality: Right;  CDE:  16.30   CHOLECYSTECTOMY  02/2013   EYE SURGERY     IR FLUORO GUIDE CV LINE RIGHT  01/05/2019   IR US GUIDE VASC ACCESS RIGHT  01/05/2019   LAPAROSCOPIC  NEPHRECTOMY Right 01/08/2019   Procedure: LAPAROSCOPIC RADICAL NEPHRECTOMY;  Surgeon: Raynelle Bring, MD;  Location: [facility];  Service: Urology;  Laterality: Right;   LEFT HEART CATH AND CORS/GRAFTS ANGIOGRAPHY N/A 04/14/2021   Procedure: LEFT HEART CATH AND CORS/GRAFTS ANGIOGRAPHY;  Surgeon: Jolaine Artist, MD;  Location: [facility];  Service: Cardiovascular;  Laterality: N/A;   PENILE PROSTHESIS IMPLANT     PERIPHERAL VASCULAR INTERVENTION Left 04/19/2021   Procedure: PERIPHERAL VASCULAR INTERVENTION;  Surgeon: Cherre Robins, MD;  Location: [facility];  Service: Cardiovascular;  Laterality: Left;   TOE AMPUTATION Right 2013   [facility]   Social History:  reports that he has never smoked. He has never used smokeless tobacco. He reports current alcohol use. He reports that he does not use drugs.  Family / Support Systems Marital Status: Married How Long?: 24 years Patient Roles: Spouse, Parent Spouse/Significant Other: Janace Hoard (wife): [phone] Children: children from previous marriage (all children live in [place]) Other Supports: none reported Anticipated Caregiver: Wife Ability/Limitations of Caregiver: Wife works 3xs per week, however, is self-employed so has flexible schedule. States she can see who if others can assist if needed. Caregiver Availability: Intermittent Family Dynamics: Pt lives with wife.  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 Pt retired and began [employer] in 2020. Education: some college Read: Yes Write: Yes Employment Status: Disabled Date Retired/Disabled/Unemployed: 2020 Public relations account executive Issues: Denies Guardian/Conservator: N/A  Abuse/Neglect Abuse/Neglect Assessment Can Be Completed: Yes Physical Abuse: Denies Verbal Abuse: Denies Sexual Abuse: Denies Exploitation of patient/patient's  resources: Denies Self-Neglect: Denies  Emotional Status Pt's affect, behavior and adjustment status: Pt in good spirits at time of visit. Recent Psychosocial Issues: Pt admits to anxiety and takes Klonopin prescribed by PCP Psychiatric History: See above noted. Hx of counseling/therapy two years ago. Substance Abuse History: occassional- etoh  Patient / Family Perceptions, Expectations & Goals Pt/Family understanding of illness & functional limitations: Pt and wife have a general understanding of care needs Premorbid pt/family roles/activities: Independent Anticipated changes in roles/activities/participation: Assistance with ADLs/IADLs Pt/family expectations/goals: Pt goal is to "get in a state when I get home, I am not a burden. Be able to to get up and move, and take care of myself as much as possible."  [employer]: None Premorbid Home Care/DME Agencies: None Transportation available at discharge: Wife Resource referrals recommended: Neuropsychology  Discharge Planning Living Arrangements: Spouse/significant other Support Systems: Spouse/significant other Type of Residence: Private residence Insurance Resources: Multimedia programmer (specify), Medicare Psychologist, counselling (primary); Medicare Part A) Financial Resources: SSD Financial Screen Referred: No Living Expenses: Mortgage Money Management: Spouse, Patient Does the patient have any problems obtaining your medications?: No Home Management: Wife managed home care needs Patient/Family Preliminary Plans: No changes Care Coordinator Barriers to Discharge: Decreased caregiver support, Lack of/limited family support Care Coordinator Anticipated Follow Up Needs: HH/OP  Clinical Impression SW met with pt and pt wife to introduce self, explain role, and discuss discharge process. No HCPOA, Pt not a veteran. DME: canes, crutches, RW, w/c (never used). Wife reports they will see if they can borrow a TTB.   Physical  address: [address] [place] [address]  Rana Snare 05/24/2021, 2:04 PM

## 2021-05-24 NOTE — Progress Notes (Signed)
Occupational Therapy Session Note  Patient Details  Name: Maxwell Harmon MRN: [id number] Date of Birth: [birth year]  Today's Date: 05/24/2021 OT Individual Time: 4599-7741 OT Individual Time Calculation (min): 70 min    Short Term Goals: Week 1:  OT Short Term Goal 1 (Week 1): Pt will complete LB dressing with min A and AE prn. OT Short Term Goal 2 (Week 1): Pt will complete functional transfers to all appropriate surfaces with CGA and LRAD. OT Short Term Goal 3 (Week 1): Pt will complete 3/3 aspects of toileting with min A. OT Short Term Goal 4 (Week 1): Pt will groom standing sink side with CGA with no more than one rest break  for improved standing tolerance for ADLs.  Skilled Therapeutic Interventions/Progress Updates:    Pt resting in bed upon arrival. Pt declined bathing and changing clothing this morning.  Pt stated he already completed tasks earlier. Pt voiced concern that he would not "be ready" to go home on 7/29. Reviewed OT LTGs (pt in agreement) and stated that we would continue to work towards those goals. Assured pt that he would not be discharged if he is "not ready." Pt commented that he has "a long way to go." Supine to sit EOB with supervision. Pt with posterior LOB in bed during transition but able to recover. Pt with difficulty maintaining sitting balance EOB when donning limb guard. Pt dependent for donning sock and shoe of Rt foot. SB transfer to w/c with min A. Pt propelled w/c to gym. Instructed pt on w/c positioning and techinque to prepare for SB transfer to EOM. SB transfer with CGA dn min verbal cues for technique/safety awareness. Pt engaged in dynamic sitting tasks seated EOB. Pt with lateral lean to Rt, requiring min verbal cues to self correct. Mirror provided for visual feedback. Education on importance of core strength/conditioning as it relates to sitting balance and standing balance. Pt verbalzied understanding. Pt engaged in reaching tasks-anterior and with trunk  rotation. Verbal cues for sitting posture. Pt with significant posterior pelvic tilt. Educated pt on importance of anterior pelvic tilt. Pt tranfserred back to w/c and returned to room. Pt remained in w/c with all needs within reach and belt alarm activated.   Therapy Documentation Precautions:  Precautions Precautions: Fall Precaution Comments: charcot foot, wound VAC, LLE NWB Required Braces or Orthoses: Other Brace Other Brace: Limb Guard Restrictions Weight Bearing Restrictions: No LLE Weight Bearing: Non weight bearing Other Position/Activity Restrictions: limb protector for mobility  Pain: Pt denies pain this morning    Therapy/Group: Individual Therapy  Leroy Libman 05/24/2021, 9:29 AM

## 2021-05-24 NOTE — Progress Notes (Signed)
Occupational Therapy Session Note  Patient Details  Name: Maxwell Harmon MRN: [id number] Date of Birth: 29-Aug-1956  Today's Date: 05/24/2021 OT Individual Time: 1100-1155 OT Individual Time Calculation (min): 55 min    Short Term Goals: Week 1:  OT Short Term Goal 1 (Week 1): Pt will complete LB dressing with min A and AE prn. OT Short Term Goal 2 (Week 1): Pt will complete functional transfers to all appropriate surfaces with CGA and LRAD. OT Short Term Goal 3 (Week 1): Pt will complete 3/3 aspects of toileting with min A. OT Short Term Goal 4 (Week 1): Pt will groom standing sink side with CGA with no more than one rest break  for improved standing tolerance for ADLs.  Skilled Therapeutic Interventions/Progress Updates:    Pt resting in w/c upon arrival. OT intervention with focus on w/c setup and SB transfers. Pt practiced propelling w/c and positioning next to EOM in preparation for SB tranfsers. Min verbal cues for positioning. SB transfer w/c<>EOM with CGA. Pt practiced placing leg rests on w/c x 4. Orange coband placed on w/c for visual cue to assist with removing leg rest. Pt placed Rt leg rest on w/c x 4 (2x without assistance or verbal cues). Pt pleased with progress. Pt propelled back to room. Pt remained in w/c with belt alarm activated. All needs within reach. Daughter present.   Therapy Documentation Precautions:  Precautions Precautions: Fall Precaution Comments: charcot foot, wound VAC, LLE NWB Required Braces or Orthoses: Other Brace Other Brace: Limb Guard Restrictions Weight Bearing Restrictions: No LLE Weight Bearing: Non weight bearing Other Position/Activity Restrictions: limb protector for mobility   Pain:  Pt denies pain this morning    Therapy/Group: Individual Therapy  Leroy Libman 05/24/2021, 12:01 PM

## 2021-05-24 NOTE — Progress Notes (Signed)
[facility] Progress Note    Assessment/ Plan:   ESRD: has been on HD due to issues with CCPD: inadequate drainage/UF (catheter malfunction?) Outpatient orders: [place], MWF, F180, 3hrs, 2k, 2.5cal, 137 na, 35bicarb, 400bfr, 16g, edw 86.5kg. hep 2k bolus -discussed with outpatient home therapies RN 7/5: no plans for further PD and is being scheduled to have PD cath removed as an outpatient, therefore no PD cath flushing needed.  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 [name]. [name]    Volume/ hypertension: EDW 86.5kg pre amputation. Over EDW and with possible pulm edema - push UF as able but also hypotensive-  profile-  low temp and midodrine pre HD.   Makes urine-  will dose with lasix-  now some issues with urinary retention-  to be placed on flomax   Anemia of Chronic Kidney Disease: Hemoglobin 8.0. No active infection so gave 0.5g IV iron load over 4 HD treatments. Receives mircera 150 mcg, last dose 6/24, ordered aranesp here. Transfuse prn-  getting labs only on HD    Secondary Hyperparathyroidism/Hyperphosphatemia: decreased auryxia dose with phos running on low side, renal diet, monitor phos-  also on calcitriol, sensipar- getting labs only with HD   DM2 w/ hyperglycemia -mgmt per primary service  Hypoalbuminemia, protein calorie malnutrition -push protein    Feeling out of it-  on prm pain and nausea meds-  may need less of those-  is better    [facility]    Subjective:   No complaints today in rehab-  for HD later today    Objective:   BP 114/69   Pulse 89   Temp 98.1 F (36.7 C) (Oral)   Resp 17   Ht 5\' 11"  (1.803 m)   Wt 93.1 kg   SpO2 93%   BMI 28.63 kg/m   Intake/Output Summary (Last 24 hours) at  05/24/2021 1308 Last data filed at 05/24/2021 0807 Gross per 24 hour  Intake 480 ml  Output --  Net 480 ml   Weight change: 0.2 kg  Physical Exam: Gen:nad, nontoxic appearing CVS:rrr Resp:normal wob on RA, a few basilar rales BJY:NWGNF RLE 1+ edema, LLE amputated with immobilizer on Neuro: sleeping Access: avf +b/t   Imaging: No results found.  Labs: BMET Recent Labs  Lab 05/19/21 0533  NA 131*  K 4.4  CL 95*  CO2 30  GLUCOSE 165*  BUN 56*  CREATININE 5.24*  CALCIUM 8.8*   CBC Recent Labs  Lab 05/19/21 0533  WBC 8.2  HGB 8.0*  HCT 25.9*  MCV 101.6*  PLT 189    Medications:     aspirin EC  81 mg Oral Daily   atorvastatin  80 mg Oral Daily   calcitRIOL  0.25 mcg Oral BID   Chlorhexidine Gluconate Cloth  6 each Topical Q0600   cinacalcet  30 mg Oral Q Mon   clopidogrel  75 mg Oral Daily   darbepoetin ([name]) injection - DIALYSIS  150 mcg Intravenous Q Wed-HD   feeding supplement (NEPRO CARB STEADY)  237 mL Oral QHS   ferric citrate  420 mg Oral TID with meals   furosemide  80 mg Oral BID   gabapentin  200 mg Oral QHS   gentamicin cream   Topical Daily  heparin  5,000 Units Subcutaneous Q8H   insulin aspart  0-6 Units Subcutaneous TID WC   insulin glargine  7 Units Subcutaneous QHS   linaclotide  145 mcg Oral QAC breakfast   midodrine  10 mg Oral Q M,W,F-HD   multivitamin  1 tablet Oral QHS   nutrition supplement (JUVEN)  1 packet Oral BID BM   pantoprazole  40 mg Oral Daily   [START ON 05/25/2021] pentoxifylline  400 mg Oral Q breakfast   senna  1 tablet Oral BID   sorbitol  30 mL Oral BID   tamsulosin  0.8 mg Oral QHS   zinc sulfate  220 mg Oral Daily   zolpidem  5 mg Oral QHS

## 2021-05-24 NOTE — Progress Notes (Signed)
Physical Therapy Session Note  Patient Details  Name: Maxwell Harmon MRN: [id number] Date of Birth: 20-Jun-1956  Today's Date: 05/24/2021 PT Individual Time: 1000-1100 PT Individual Time Calculation (min): 60 min   Short Term Goals: Week 1:  PT Short Term Goal 1 (Week 1): Pt will initiate gait training with LRAD PT Short Term Goal 2 (Week 1): Pt will perform lateral transfer with CGA PT Short Term Goal 3 (Week 1): Pt will be mod I with w/c propulsion up to 150 ft  Skilled Therapeutic Interventions/Progress Updates:    Pt received seated in w/c in room, agreeable to PT session. No complaints of pain. Manual w/c propulsion to/from therapy gym at Supervision level with use of BUE. Sit to stand in // bars x 10 reps with min A increasing to mod A with onset of fatigue. Pt able to perform standing dips/squats with focus on use of BUE and RLE to return to upright position. Attempt to have pt perform "hop" with RLE, pt unable to clear floor. Slide board transfer to/from mat table with min A. Seated crunches with anterior lean and alt UE punch-out, 3 x 10 reps for core strengthening and sitting balance training. Seated BLE strengthening therex: LAQ, marches. Pt requires assist to don/doff residual limb guard. Pt left seated in w/c in room with needs in reach, quick release belt and chair alarm in place at end of session.  Therapy Documentation Precautions:  Precautions Precautions: Fall Precaution Comments: charcot foot, wound VAC, LLE NWB Required Braces or Orthoses: Other Brace Other Brace: Limb Guard Restrictions Weight Bearing Restrictions: No LLE Weight Bearing: Non weight bearing Other Position/Activity Restrictions: limb protector for mobility    Therapy/Group: Individual Therapy   Excell Seltzer, PT, DPT, CSRS  05/24/2021, 5:39 PM

## 2021-05-24 NOTE — Progress Notes (Signed)
PROGRESS NOTE   Subjective/Complaints:  Slept much better- but also more awake this AM- feels "more perky".  Also describing waking up "more awake and aware".  Concerned won't be ready by d/c date.  Explained he's not going home Mod I- is going Supervision, however think he willl be ready- but assured him if not, will extend date.  LBM 2 days ago- usually daily Was cathed for 300cc this AM- still not peeing- on Flomax 0.4 mg q evening- will increase to 0.8 mg.   Will also start Senokot 1 tab BID- also on Miralax BID and sorbitol BID as well as jist started Linzess. First dose this AM.    ROS:  Pt denies SOB, abd pain, CP, N/V and vision changes    Objective:   No results found. Recent Labs    05/24/21 1417  WBC 7.7  HGB 8.3*  HCT 27.7*  PLT 178    Recent Labs    05/24/21 1417  NA 131*  K 5.2*  CL 96*  CO2 28  GLUCOSE 161*  BUN 67*  CREATININE 5.31*  CALCIUM 9.1     Intake/Output Summary (Last 24 hours) at 05/24/2021 1539 Last data filed at 05/24/2021 1311 Gross per 24 hour  Intake 480 ml  Output --  Net 480 ml        Physical Exam: Vital Signs Blood pressure (!) 96/52, pulse 79, temperature (!) 97.4 F (36.3 C), temperature source Oral, resp. rate 16, height 5\' 11"  (1.803 m), weight 92.5 kg, SpO2 94 %.     General: awake, alert, appropriate, sitting up in bed; much more awake, alert; NAD HENT: conjugate gaze; oropharynx moist CV: regular rate; no JVD Pulmonary: CTA B/L; no W/R/R- good air movement GI: soft, NT, ND, (+)BS; hypoactive; slightly protuberant Psychiatric: appropriate- more more awake/alert and interactive- still flat Neurological: more alert  Musculoskeletal:    Cervical back: Normal range of motion and neck supple.    Comments: Right heel boggy and red. R foot- Charcot food- has no arch- no actual skin broken down- scabs R calf scattered   L BKA with VAC in place-  no change UE 5/5 B/L in biceps, triceps, WE< grip and FA Les- RLE- HF 4+/5, KF/KE 4/5, DF and PF 4+/5 LLE- HF and KE at least 3+/5- painful to do ROM Shaping OK- has shrinker on with limb guard over it- C/D/I Skin: L BKA- a little yellowish/serous drainage- appears ot have a popped blister and 1 that's intact medial aspect of BKA- shaping well- localized erythema, but no increased warmth.     Comments: IV R forearm LUE fistula- can feel thrill Scattered scabs on R calf/shin Fallen arch on R foot/charcot foot R heel boggy  Neurological:    Mental Status: He is alert and oriented to person, place, and time.    Comments: Decreased to absent Sensation to light touch from R knee downwards  RUE twitching occ.    Assessment/Plan: 1. Functional deficits which require 3+ hours per day of interdisciplinary therapy in a comprehensive inpatient rehab setting. Physiatrist is providing close team supervision and 24 hour management of active medical problems listed below.  Physiatrist and rehab team continue to assess barriers to discharge/monitor patient progress toward functional and medical goals  Care Tool:  Bathing    Body parts bathed by patient: Right arm, Chest, Left arm, Abdomen, Front perineal area, Face   Body parts bathed by helper: Right lower leg Body parts n/a: Left lower leg   Bathing assist Assist Level: Moderate Assistance - Patient 50 - 74%     Upper Body Dressing/Undressing Upper body dressing   What is the patient wearing?: Pull over shirt    Upper body assist Assist Level: Set up assist    Lower Body Dressing/Undressing Lower body dressing      What is the patient wearing?: Pants     Lower body assist Assist for lower body dressing: Maximal Assistance - Patient 25 - 49%     Toileting Toileting    Toileting assist Assist for toileting: Maximal Assistance - Patient 25 - 49%     Transfers Chair/bed transfer  Transfers assist     Chair/bed transfer  assist level: Minimal Assistance - Patient > 75% Chair/bed transfer assistive device: Sliding board   Locomotion Ambulation   Ambulation assist   Ambulation activity did not occur: Safety/medical concerns          Walk 10 feet activity   Assist  Walk 10 feet activity did not occur: Safety/medical concerns        Walk 50 feet activity   Assist Walk 50 feet with 2 turns activity did not occur: Safety/medical concerns         Walk 150 feet activity   Assist Walk 150 feet activity did not occur: Safety/medical concerns         Walk 10 feet on uneven surface  activity   Assist Walk 10 feet on uneven surfaces activity did not occur: Safety/medical concerns         Wheelchair     Assist Will patient use wheelchair at discharge?: Yes Type of Wheelchair: Manual    Wheelchair assist level: Supervision/Verbal cueing Max wheelchair distance: 100'    Wheelchair 50 feet with 2 turns activity    Assist        Assist Level: Supervision/Verbal cueing   Wheelchair 150 feet activity     Assist      Assist Level: Maximal Assistance - Patient 25 - 49%   Blood pressure (!) 96/52, pulse 79, temperature (!) 97.4 F (36.3 C), temperature source Oral, resp. rate 16, height 5\' 11"  (1.803 m), weight 92.5 kg, SpO2 94 %.  Medical Problem List and Plan: 1.  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  Con't PT and OT- HD on M/W/F PM 2.  Antithrombotics: -DVT/anticoagulation:  Pharmaceutical: Heparin             -antiplatelet therapy: ASA/Plavix 3. Pain Management: Change dilaudid to oxycodone prn.             --resume gabapentin to help manage phantom pain.  7/16- reduce Oxy to 2.5 to 5 mg q4 hours prn for pain and ocn't tramadol as well  7/17- pain still an issue-  nerve pain and residual limb- on  max dose of Gabapentin- will ask therapy to do mirror therapy.  7/18 no pain but very groggy and having tremors , reduce gabapentin to 400mg    7/20- pain doing OK even with reduction in gabapentin to 200 mg QHS- con't regimen 4. Mood: LCSW to follow for evaluation and support.               -antipsychotic agents: N/A 5. Neuropsych: This patient is capable of making decisions on his own behalf. 6. Skin/Wound Care: Continue wound VAC             --On Zinc and Juven to help promote wound healing.  7/16- remove VAC- is due today-   7/17- has 1 blister, but looks OK- will monitor 7. Fluids/Electrolytes/Nutrition: Strict I/O with daily weights. 8. R -charcot foot: Encourage wearing CAM boot with activity. 9. T2DM: Hgb A1C-8.2. Monitor BS ac/hs             --continue Lantus 5 units with SSI for elevated BS  CBG (last 3)  Recent Labs    05/23/21 2104 05/24/21 0624 05/24/21 1146  GLUCAP 256* 163* 111*   7/20- BG's today look great- last evening was 256- will give 14more day before changing if needed 10. ESRD: HD MWF at the end of the day to facilitate therapy and endurance issues             --No plans to resume PD. Midodrine for BP support in hemo.  7/20- K+ 5.2 today- per renal- also got approval from renal to increase Flomax.  11. CAD w/recent NSTEMI: Treated medically w/ASA, Plavix and Lipitor.   12. Acute on chronic anemia: Loaded with IV ferric gluconate --continue [name] 150 mc weekly with serial CBC to monitor for stability . 13. PAD: On trental, plavix and ASA. 14. Chronic constipation: Continue Sorbitol bid.  7/16- LBM Thursday- gave an addiitonal dose of Sorbitol since cannot give Mg citrate  7/17- LBM overnight- was hard- will con't Sorbitol BID- might need more as time goes on.   7/19- will add Linzess 145 mcg daily- starting tomorrow  7/20- will add Senokot 1 tab BID and con't miralax BID and Sorbitol BID for now- goal to d/c Sorbitol- con't  Linzess- just started this AM 15. Hyperparathyroidism: Being managed with aruxia and Calcitriol.  16. Insomnia/RLS: Resume gabapentin for phantom pain as well as RLS.             --Klonopin prn for sleep  7/17- cannot use trazodone for sleep- causes urinary retention/makes it worse- will d/c prn trazodone- con't klonopin and add Melatonin- pt said not real helpful, but maybe with klonpin will help. .   7/19- slept poorly- will try Ambien 5 mg QHS- but no higher. Will monitor  7/20- much improved with Ambien- con't regimen 17 H/o  Depression/anxiety: Continue Lexapro (started a few weeks ago) and klonopin.  7/16- will decrease Klonopin to 0.25 mg BID prn since too sleepy/dazed this AM  7/17- d/c lexapro per pt request.  18. Cough 7/16- will check CXR-  shows likely atelectasis since WBC is normal and afebrile.      19. Restart meds for hyperparathyroidism-  32. Oliguria/Urinary retention  7/17- required cath for 500cc overnight; will ask to bladder scan q8 hours- usually voided 1-2x/day per pt. Don't want him getting UTI. 7/20- will increase Flomax to 0.8 mg nightly- still requiring caths.       LOS: 6 days A FACE TO FACE EVALUATION WAS PERFORMED  Maxwell Harmon 05/24/2021,  3:39 PM

## 2021-05-25 DIAGNOSIS — E1122 Type 2 diabetes mellitus with diabetic chronic kidney disease: Secondary | ICD-10-CM

## 2021-05-25 DIAGNOSIS — I5023 Acute on chronic systolic (congestive) heart failure: Secondary | ICD-10-CM

## 2021-05-25 DIAGNOSIS — G4701 Insomnia due to medical condition: Secondary | ICD-10-CM

## 2021-05-25 DIAGNOSIS — Z794 Long term (current) use of insulin: Secondary | ICD-10-CM

## 2021-05-25 DIAGNOSIS — Z992 Dependence on renal dialysis: Secondary | ICD-10-CM

## 2021-05-25 DIAGNOSIS — N186 End stage renal disease: Secondary | ICD-10-CM

## 2021-05-25 LAB — GLUCOSE, CAPILLARY
Glucose-Capillary: 169 mg/dL — ABNORMAL HIGH (ref 70–99)
Glucose-Capillary: 151 mg/dL — ABNORMAL HIGH (ref 70–99)
Glucose-Capillary: 163 mg/dL — ABNORMAL HIGH (ref 70–99)
Glucose-Capillary: 153 mg/dL — ABNORMAL HIGH (ref 70–99)

## 2021-05-25 MED ORDER — CHLORHEXIDINE GLUCONATE CLOTH 2 % EX PADS
6.0000 | MEDICATED_PAD | Freq: Every day | CUTANEOUS | Status: DC
  Administered 2021-05-26 – 2021-05-28 (×3): 6 via TOPICAL

## 2021-05-25 MED ORDER — FERRIC CITRATE 1 GM 210 MG(FE) PO TABS
210.0000 mg | ORAL_TABLET | Freq: Three times a day (TID) | ORAL | Status: DC
  Administered 2021-05-25 – 2021-05-26 (×5): 210 mg via ORAL
  Filled 2021-05-25 (×7): qty 1

## 2021-05-25 MED ORDER — SENNOSIDES-DOCUSATE SODIUM 8.6-50 MG PO TABS
1.0000 | ORAL_TABLET | Freq: Two times a day (BID) | ORAL | Status: DC
  Administered 2021-05-25 – 2021-06-01 (×15): 1 via ORAL
  Filled 2021-05-25 (×15): qty 1

## 2021-05-25 MED ORDER — QUETIAPINE FUMARATE 25 MG PO TABS
25.0000 mg | ORAL_TABLET | Freq: Every evening | ORAL | Status: DC | PRN
  Administered 2021-05-26: 25 mg via ORAL
  Filled 2021-05-25: qty 1

## 2021-05-25 NOTE — Progress Notes (Signed)
Occupational Therapy Weekly Progress Note  Patient Details  Name: Maxwell Harmon MRN: [id number] Date of Birth: 28-Sep-1956  Beginning of progress report period: May 19, 2021 End of progress report period: May 25, 2021   Patient has met 0 of 4 short term goals.  Pt progress has been slow during the past week. Pt was extremely lethargic for 2-3 days with difficulty sequencing and following commands. Pt demonstrates poor sitting balance EOB and/or EOM but is able to self correct with verbal cues. Pt requires max A for LB dressing tasks at bed level. Pt unable to maintain standing for LB clothing management and currently completing at EOB/bed level. Pt performs SB tranfsers with min A/CGA and mod verbal cues for sequencing.   Patient continues to demonstrate the following deficits: muscle weakness and muscle joint tightness, decreased cardiorespiratoy endurance, decreased problem solving and delayed processing, and decreased sitting balance, decreased standing balance, decreased postural control, and decreased balance strategies and therefore will continue to benefit from skilled OT intervention to enhance overall performance with BADL.  Patient progressing toward long term goals..  Continue plan of care for now to assess progress toward supervision level goals.    OT Short Term Goals Week 1:  OT Short Term Goal 1 (Week 1): Pt will complete LB dressing with min A and AE prn. OT Short Term Goal 1 - Progress (Week 1): Progressing toward goal OT Short Term Goal 2 (Week 1): Pt will complete functional transfers to all appropriate surfaces with CGA and LRAD. OT Short Term Goal 2 - Progress (Week 1): Progressing toward goal OT Short Term Goal 3 (Week 1): Pt will complete 3/3 aspects of toileting with min A. OT Short Term Goal 3 - Progress (Week 1): Progressing toward goal OT Short Term Goal 4 (Week 1): Pt will groom standing sink side with CGA with no more than one rest break  for improved standing  tolerance for ADLs. OT Short Term Goal 4 - Progress (Week 1): Progressing toward goal Week 2:  OT Short Term Goal 1 (Week 2): Pt will complete LB dressing with min A and AE prn. OT Short Term Goal 2 (Week 2): Pt will complete functional transfers to all appropriate surfaces with CGA and LRAD. OT Short Term Goal 3 (Week 2): Pt will complete 3/3 aspects of toileting with min A. OT Short Term Goal 4 (Week 2): Pt will groom standing sink side with CGA with no more than one rest break  for improved standing tolerance for ADLs.      Leotis Shames [facility] 05/25/2021, 6:32 AM

## 2021-05-25 NOTE — Progress Notes (Signed)
Physical Therapy Session Note  Patient Details  Name: Maxwell Harmon MRN: [id number] Date of Birth: Aug 29, 1956  Today's Date: 05/25/2021 PT Individual Time: 1055-1203 PT Individual Time Calculation (min): 68 min   Short Term Goals: Week 1:  PT Short Term Goal 1 (Week 1): Pt will initiate gait training with LRAD PT Short Term Goal 2 (Week 1): Pt will perform lateral transfer with CGA PT Short Term Goal 3 (Week 1): Pt will be mod I with w/c propulsion up to 150 ft  Skilled Therapeutic Interventions/Progress Updates:    Pt received sitting in wc and agreeable to physical therapy session. Pt reports he did not sleep well and is feeling groggy this morning. Gait belt placed prior to initiation of mobility.  Pt demonstrated slide board transfers wc<>mat with MinA and set up assistance.    EOM exercises completed for bilateral hips, knees, and core strengthening with verbal cuing for upright posture throughout. Hip ADD with ball squeeze 10x, 3 second holds  Hip ABD with Level 2 TB 10x, 3 second holds LAQ 10x, 3 second holds Marching 10x  Scap squeezes with level 2 TB x15 (tactile and verbal cuing for correct mechanics)  Seated crunches with anterior weight shifting and alt UE reaching outside BOS 1x16, 1x10   Exercises in // bars mini-squat with tricep dips 2x CGA and verbal cuing for upright posture. Pt educated on technique to allow foot clearance as pregait activity. Pt demonstrated ability to perform tricep push up for foot clearance in order to reduce forceful impact on RLE x1. Pt completed STS in between sets with CGA-MinA and verbal encouragement for RLE quad activation.   Pt sitting in wc, breaks locked, seatbelt alarm on, and call bell within reach. No further needs expressed at this time.   Therapy Documentation Precautions:  Precautions Precautions: Fall Precaution Comments: charcot foot, wound VAC, LLE NWB Required Braces or Orthoses: Other Brace Other Brace: Limb  Guard Restrictions Weight Bearing Restrictions: No LLE Weight Bearing: Non weight bearing Other Position/Activity Restrictions: limb protector for mobility  Pain: Pain Assessment Pain Scale: 0-10 Pain Score: 0-No pain   Therapy/Group: Individual Therapy  Kobyn Kray, SPT  05/25/2021, 12:35 PM

## 2021-05-25 NOTE — Progress Notes (Signed)
[facility] Progress Note    Assessment/ Plan:   ESRD: has been on HD due to issues with CCPD: inadequate drainage/UF (catheter malfunction?) Outpatient orders: [place], MWF, F180, 3hrs, 2k, 2.5cal, 137 na, 35bicarb, 400bfr, 16g, edw 86.5kg. hep 2k bolus -discussed with outpatient home therapies RN 7/5: no plans for further PD and is being scheduled to have PD cath removed as an outpatient, therefore no PD cath flushing needed.  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 [name]. [name]    Volume/ hypertension: EDW 86.5kg pre amputation. Over EDW and with possible pulm edema - push UF as able but also hypotensive-  profile-  low temp and midodrine pre HD.   Makes urine-  dosing with lasix-  now some issues with urinary retention-   placed on flomax   Anemia of Chronic Kidney Disease: Hemoglobin 8.0. No active infection so gave 0.5g IV iron load over 4 HD treatments. Receives mircera 150 mcg, last dose 6/24, ordered aranesp here. Transfuse prn-  getting labs only on HD    Secondary Hyperparathyroidism/Hyperphosphatemia: decreased auryxia dose with phos running on low side, renal diet, monitor phos-  also on calcitriol, sensipar- getting labs only with HD   DM2 w/ hyperglycemia -mgmt per primary service  Hypoalbuminemia, protein calorie malnutrition -push protein    Feeling out of it-  on prm pain and nausea meds-  may need less of those-  is better    Adelma Bowdoin [facility]    Subjective:   No complaints today in rehab-  HD yest-  removed 3500-  limited some by hypotension    Objective:   BP 110/69 (BP Location: Right Arm)   Pulse 94   Temp 98.8 F (37.1 C)   Resp 18   Ht 5\' 11"  (1.803 m)   Wt 89 kg   SpO2 95%   BMI 27.37 kg/m    Intake/Output Summary (Last 24 hours) at 05/25/2021 1101 Last data filed at 05/25/2021 0855 Gross per 24 hour  Intake 480 ml  Output 3500 ml  Net -3020 ml   Weight change: -0.6 kg  Physical Exam: Gen:nad, nontoxic appearing CVS:rrr Resp:normal wob on RA, a few basilar rales PJA:SNKNL RLE 1+ edema, LLE amputated with immobilizer on Neuro: sleeping Access: avf +b/t   Imaging: No results found.  Labs: BMET Recent Labs  Lab 05/19/21 0533 05/24/21 1417  NA 131* 131*  K 4.4 5.2*  CL 95* 96*  CO2 30 28  GLUCOSE 165* 161*  BUN 56* 67*  CREATININE 5.24* 5.31*  CALCIUM 8.8* 9.1  PHOS  --  2.5   CBC Recent Labs  Lab 05/19/21 0533 05/24/21 1417  WBC 8.2 7.7  HGB 8.0* 8.3*  HCT 25.9* 27.7*  MCV 101.6* 104.5*  PLT 189 178    Medications:     aspirin EC  81 mg Oral Daily   atorvastatin  80 mg Oral Daily   calcitRIOL  0.25 mcg Oral BID   Chlorhexidine Gluconate Cloth  6 each Topical Q0600   cinacalcet  30 mg Oral Q Mon   clopidogrel  75 mg Oral Daily   darbepoetin ([name]) injection - DIALYSIS  150 mcg Intravenous Q Wed-HD   feeding supplement (NEPRO CARB STEADY)  237 mL Oral QHS   ferric citrate  420 mg Oral TID with meals   furosemide  80 mg Oral BID   gabapentin  200 mg Oral QHS   gentamicin cream   Topical Daily   heparin  5,000 Units Subcutaneous Q8H   insulin aspart  0-6 Units Subcutaneous TID WC   insulin glargine  7 Units Subcutaneous QHS   linaclotide  145 mcg Oral QAC breakfast   midodrine  10 mg Oral Q M,W,F-HD   multivitamin  1 tablet Oral QHS   nutrition supplement (JUVEN)  1 packet Oral BID BM   pantoprazole  40 mg Oral Daily   pentoxifylline  400 mg Oral Q breakfast   senna-docusate  1 tablet Oral BID   sorbitol  30 mL Oral BID   tamsulosin  0.8 mg Oral QHS   zolpidem  5 mg Oral QHS

## 2021-05-25 NOTE — Progress Notes (Signed)
Occupational Therapy Session Note  Patient Details  Name: Maxwell Harmon MRN: [id number] Date of Birth: Jan 17, 1956  Today's Date: 05/25/2021 OT Individual Time: 0700-0715 OT Individual Time Calculation (min): 15 min  and Today's Date: 05/25/2021 OT Missed Time: 60 Minutes Missed Time Reason: Patient fatigue   Short Term Goals: Week 2:  OT Short Term Goal 1 (Week 2): Pt will complete LB dressing with min A and AE prn. OT Short Term Goal 2 (Week 2): Pt will complete functional transfers to all appropriate surfaces with CGA and LRAD. OT Short Term Goal 3 (Week 2): Pt will complete 3/3 aspects of toileting with min A. OT Short Term Goal 4 (Week 2): Pt will groom standing sink side with CGA with no more than one rest break  for improved standing tolerance for ADLs.  Skilled Therapeutic Interventions/Progress Updates:    Pt sleeping soundly upon arrival. Pt required mod multimodal cues to arouse. Pt stated he did not sleep well during night because his "mind kept racing." Pt slow to respond to questions. Pt had not eaten breakfast. Breakfast had not been delivered to floor. RN notified and tray ordered for pt. Pt very lethargic this morning. Pt missed 60 mins skilled OT services-awaiting breakfast tray. Pt with difficulty keeping eyes open this morning. Will check back later. Pt apologetic and stated "I had planned on being ready this morning" for therapy.   Therapy Documentation Precautions:  Precautions Precautions: Fall Precaution Comments: charcot foot, wound VAC, LLE NWB Required Braces or Orthoses: Other Brace Other Brace: Limb Guard Restrictions Weight Bearing Restrictions: No LLE Weight Bearing: Non weight bearing Other Position/Activity Restrictions: limb protector for mobility General: General OT Amount of Missed Time: 60 Minutes Pain:  Pt denies pain this morning    Therapy/Group: Individual Therapy  Leroy Libman 05/25/2021, 8:08 AM

## 2021-05-25 NOTE — Progress Notes (Signed)
Occupational Therapy Session Note  Patient Details  Name: Maxwell Harmon MRN: [id number] Date of Birth: 1956/04/16  Today's Date: 05/25/2021 OT Individual Time: 1400-1430 OT Individual Time Calculation (min): 30 min    Short Term Goals: Week 2:  OT Short Term Goal 1 (Week 2): Pt will complete LB dressing with min A and AE prn. OT Short Term Goal 2 (Week 2): Pt will complete functional transfers to all appropriate surfaces with CGA and LRAD. OT Short Term Goal 3 (Week 2): Pt will complete 3/3 aspects of toileting with min A. OT Short Term Goal 4 (Week 2): Pt will groom standing sink side with CGA with no more than one rest break  for improved standing tolerance for ADLs.  Skilled Therapeutic Interventions/Progress Updates:    Pt resting in w/c upon arrival.  Pt reported that he was more awake this afternoon as compared to morning. Pt propelled w/c to ortho gym and positioned w/c for transfer to EOM. Pt removed leg rest with min verbal cues. SB transfer to EOM with CGA. Pt performed modified crunches with wedge and 2 pillows positioned behind him-1x8; 1x8 with only one pillow; 1x8 with only wedge. Discussed with pt the importance of core strengthening/conditioning. Pt transferred back to w/c and propelled to room. Pt requested to return to bed and performed SB transfer back to bed with CGA. Pt able to reposition in bed without assistance. Pt remained in bed with all needs within reach and bed alarm activated.   Therapy Documentation Precautions:  Precautions Precautions: Fall Precaution Comments: charcot foot, wound VAC, LLE NWB Required Braces or Orthoses: Other Brace Other Brace: Limb Guard Restrictions Weight Bearing Restrictions: No LLE Weight Bearing: Non weight bearing Other Position/Activity Restrictions: limb protector for mobility Pain: Pain Assessment Pain Scale: 0-10 Pain Score: 0-No pain    Therapy/Group: Individual Therapy  Leroy Libman 05/25/2021, 2:46  PM

## 2021-05-25 NOTE — Progress Notes (Signed)
PROGRESS NOTE   Subjective/Complaints:  Didn't sleep at night last night- not at all- feeling real rough this AM - Maxwell Harmon mind was racing over "stupid stuff".  Did try Klonopin last night- didn't help. Maxwell Harmon it made him feel like "going crazy" because cannot sleep.   Did have BM- was hard.   Still not peeing at all- requiring caths 1x/day.   ROS:  Pt denies SOB, abd pain, CP, N/V/C/D, and vision changes    Objective:   No results found. Recent Labs    05/24/21 1417  WBC 7.7  HGB 8.3*  HCT 27.7*  PLT 178    Recent Labs    05/24/21 1417  NA 131*  K 5.2*  CL 96*  CO2 28  GLUCOSE 161*  BUN 67*  CREATININE 5.31*  CALCIUM 9.1     Intake/Output Summary (Last 24 hours) at 05/25/2021 1014 Last data filed at 05/25/2021 0855 Gross per 24 hour  Intake 480 ml  Output 3500 ml  Net -3020 ml        Physical Exam: Vital Signs Blood pressure 110/69, pulse 94, temperature 98.8 F (37.1 C), resp. rate 18, height 5\' 11"  (1.803 m), weight 89 kg, SpO2 95 %.      General: awake, alert, appropriate, but looks rough- like hasn't slept- which he hasn't- sitting up in bed; a little more dazed appearing today; NAD HENT: conjugate gaze; oropharynx moist CV: regular rate; no JVD Pulmonary: CTA B/L; no W/R/R- good air movement GI: soft, NT, ND, (+)BS- hypoactive Psychiatric: appropriate, but a little slowed/depressed appearing Neurological: alert, but real sleepy  Musculoskeletal:    Cervical back: Normal range of motion and neck supple.    Comments: Right heel boggy and red. R foot- Charcot food- has no arch- no actual skin broken down- scabs R calf scattered   L BKA is shaping great- has staples intact- some dried blood noted- shrinker sticking to staples, however no active drainage seen- a little dried serous yellow spots, but no erythema- mild edema.  UE 5/5 B/L in biceps, triceps, WE< grip and FA Les- RLE-  HF 4+/5, KF/KE 4/5, DF and PF 4+/5 LLE- HF and KE at least 3+/5- painful to do ROM Shaping OK- has shrinker on with limb guard over it- C/D/I Skin: L BKA as above.     Comments: IV R forearm LUE fistula- can feel thrill Scattered scabs on R calf/shin Fallen arch on R foot/charcot foot R heel boggy - slightly less Neurological:    Mental Status: He is alert and oriented to person, place, and time.    Comments: Decreased to absent Sensation to light touch from R knee downwards  RUE twitching occ.    Assessment/Plan: 1. Functional deficits which require 3+ hours per day of interdisciplinary therapy in a comprehensive inpatient rehab setting. Physiatrist is providing close team supervision and 24 hour management of active medical problems listed below. Physiatrist and rehab team continue to assess barriers to discharge/monitor patient progress toward functional and medical goals  Care Tool:  Bathing    Body parts bathed by patient: Right arm, Chest, Left arm, Abdomen, Front perineal area, Face  Body parts bathed by helper: Right lower leg Body parts n/a: Left lower leg   Bathing assist Assist Level: Moderate Assistance - Patient 50 - 74%     Upper Body Dressing/Undressing Upper body dressing   What is the patient wearing?: Pull over shirt    Upper body assist Assist Level: Set up assist    Lower Body Dressing/Undressing Lower body dressing      What is the patient wearing?: Pants     Lower body assist Assist for lower body dressing: Maximal Assistance - Patient 25 - 49%     Toileting Toileting    Toileting assist Assist for toileting: Maximal Assistance - Patient 25 - 49%     Transfers Chair/bed transfer  Transfers assist     Chair/bed transfer assist level: Minimal Assistance - Patient > 75% Chair/bed transfer assistive device: Sliding board   Locomotion Ambulation   Ambulation assist   Ambulation activity did not occur: Safety/medical concerns           Walk 10 feet activity   Assist  Walk 10 feet activity did not occur: Safety/medical concerns        Walk 50 feet activity   Assist Walk 50 feet with 2 turns activity did not occur: Safety/medical concerns         Walk 150 feet activity   Assist Walk 150 feet activity did not occur: Safety/medical concerns         Walk 10 feet on uneven surface  activity   Assist Walk 10 feet on uneven surfaces activity did not occur: Safety/medical concerns         Wheelchair     Assist Will patient use wheelchair at discharge?: Yes Type of Wheelchair: Manual    Wheelchair assist level: Supervision/Verbal cueing Max wheelchair distance: 100'    Wheelchair 50 feet with 2 turns activity    Assist        Assist Level: Supervision/Verbal cueing   Wheelchair 150 feet activity     Assist      Assist Level: Maximal Assistance - Patient 25 - 49%   Blood pressure 110/69, pulse 94, temperature 98.8 F (37.1 C), resp. rate 18, height 5\' 11"  (1.803 m), weight 89 kg, SpO2 95 %.  Medical Problem List and Plan: 1.  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  Con't PT and OT- HD on M/W/F PM  -con't PT and OT 2.  Antithrombotics: -DVT/anticoagulation:  Pharmaceutical: Heparin             -antiplatelet therapy: ASA/Plavix 3. Pain Management: Change dilaudid to oxycodone prn.             --resume gabapentin to help manage phantom pain.  7/16- reduce Oxy to 2.5 to 5 mg q4 hours prn for pain and ocn't tramadol as well  7/17- pain still an issue- nerve pain and residual limb- on max dose of Gabapentin- will ask therapy to do mirror therapy.  7/18 no pain but very groggy and having tremors , reduce gabapentin to 400mg    7/20- pain doing OK even with reduction in gabapentin  to 200 mg QHS- con't  regimen 4. Mood: LCSW to follow for evaluation and support.               -antipsychotic agents: N/A 5. Neuropsych: This patient is capable of making decisions on his own behalf. 6. Skin/Wound Care: Continue wound VAC             --On Zinc and Juven to help promote wound healing.  7/16- remove VAC- is due today-   7/17- has 1 blister, but looks OK- will monitor  7/21- skin looks better- dried blood- no erythema seen- con't shrinker- shaping well 7. Fluids/Electrolytes/Nutrition: Strict I/O with daily weights. 8. R -charcot foot: Encourage wearing CAM boot with activity. 9. T2DM: Hgb A1C-8.2. Monitor BS ac/hs             --continue Lantus 5 units with SSI for elevated BS  CBG (last 3)  Recent Labs    05/24/21 1747 05/24/21 2139 05/25/21 0546  GLUCAP 103* 241* 169*   7/21- keeps having 1 high night time elevation >200- but 103-169 otherwise- con't regimen 10. ESRD: HD MWF at the end of the day to facilitate therapy and endurance issues             --No plans to resume PD. Midodrine for BP support in hemo.  7/20- K+ 5.2 today- per renal- also got approval from renal to increase Flomax 7/21- Per renal for K+ 5.2.  11. CAD w/recent NSTEMI: Treated medically w/ASA, Plavix and Lipitor.   12. Acute on chronic anemia: Loaded with IV ferric gluconate --continue [name] 150 mc weekly with serial CBC to monitor for stability . 13. PAD: On trental, plavix and ASA. 14. Chronic constipation: Continue Sorbitol bid.  7/16- LBM Thursday- gave an addiitonal dose of Sorbitol since cannot give Mg citrate  7/17- LBM overnight- was hard- will con't Sorbitol BID- might need more as time goes on.   7/19- will add Linzess 145 mcg daily- starting tomorrow  7/20- will add Senokot 1 tab BID and con't miralax BID and Sorbitol BID for now- goal to d/c Sorbitol- con't Linzess- just started this AM  7/21- Had good BM, however still hard- change senokot to senokot-S for stool softener benefits.   15. Hyperparathyroidism: Being managed with aruxia and Calcitriol.  16. Insomnia/RLS: Resume gabapentin for phantom pain as well as RLS.             --Klonopin prn for sleep  7/17- cannot use trazodone for sleep- causes urinary retention/makes it worse- will d/c prn trazodone- con't klonopin and add Melatonin- pt said not real helpful, but maybe with klonpin will help. .   7/19- slept poorly- will try Ambien 5 mg QHS- but no higher. Will monitor  7/20- much improved with Ambien- con't regimen  7/21- will increase Ambien to 10 mg QHS- Ok'd by pharmacy- and add Seroquel 25 mg QHS PRN since "mind racing"- cannot do Elavil?Trazodone due ot urinary retention.  75 H/o  Depression/anxiety: Continue Lexapro (started a few weeks ago) and klonopin.  7/16- will decrease Klonopin to 0.25 mg BID prn since too sleepy/dazed this AM  7/17- d/c lexapro per pt request.  18. Cough 7/16- will check CXR-  shows likely atelectasis since WBC is normal and afebrile.      19. Restart meds for hyperparathyroidism-  76. Oliguria/Urinary retention  7/17- required cath for 500cc overnight; will ask to bladder scan q8 hours- usually voided 1-2x/day per pt. Don't want him getting UTI. 7/20- will increase Flomax to 0.8 mg nightly- still  requiring caths.  7/21- no change yet- con't regimen       LOS: 7 days A FACE TO FACE EVALUATION WAS PERFORMED  Moneisha Vosler 05/25/2021, 10:14 AM

## 2021-05-26 LAB — RENAL FUNCTION PANEL
Albumin: 2.3 g/dL — ABNORMAL LOW (ref 3.5–5.0)
Anion gap: 7 (ref 5–15)
BUN: 39 mg/dL — ABNORMAL HIGH (ref 8–23)
CO2: 31 mmol/L (ref 22–32)
Calcium: 9.1 mg/dL (ref 8.9–10.3)
Chloride: 95 mmol/L — ABNORMAL LOW (ref 98–111)
Creatinine, Ser: 3.46 mg/dL — ABNORMAL HIGH (ref 0.61–1.24)
GFR, Estimated: 19 mL/min — ABNORMAL LOW (ref >60)
Glucose, Bld: 205 mg/dL — ABNORMAL HIGH (ref 70–99)
Phosphorus: 1.6 mg/dL — ABNORMAL LOW (ref 2.5–4.6)
Potassium: 4.2 mmol/L (ref 3.5–5.1)
Sodium: 133 mmol/L — ABNORMAL LOW (ref 135–145)

## 2021-05-26 LAB — CBC
HCT: 28.2 % — ABNORMAL LOW (ref 39.0–52.0)
Hemoglobin: 8.8 g/dL — ABNORMAL LOW (ref 13.0–17.0)
MCH: 32.5 pg (ref 26.0–34.0)
MCHC: 31.2 g/dL (ref 30.0–36.0)
MCV: 104.1 fL — ABNORMAL HIGH (ref 80.0–100.0)
Platelets: 169 10*3/uL (ref 150–400)
RBC: 2.71 MIL/uL — ABNORMAL LOW (ref 4.22–5.81)
RDW: 19.9 % — ABNORMAL HIGH (ref 11.5–15.5)
WBC: 5.8 10*3/uL (ref 4.0–10.5)
nRBC: 0 % (ref 0.0–0.2)

## 2021-05-26 LAB — GLUCOSE, CAPILLARY
Glucose-Capillary: 149 mg/dL — ABNORMAL HIGH (ref 70–99)
Glucose-Capillary: 161 mg/dL — ABNORMAL HIGH (ref 70–99)
Glucose-Capillary: 180 mg/dL — ABNORMAL HIGH (ref 70–99)

## 2021-05-26 NOTE — Progress Notes (Signed)
Occupational Therapy Session Note  Patient Details  Name: Maxwell Harmon MRN: [id number] Date of Birth: 1956/05/06  Today's Date: 05/26/2021 OT Individual Time: 7014-1030 OT Individual Time Calculation (min): 58 min    Short Term Goals:  Week 2:  OT Short Term Goal 1 (Week 2): Pt will complete LB dressing with min A and AE prn. OT Short Term Goal 2 (Week 2): Pt will complete functional transfers to all appropriate surfaces with CGA and LRAD. OT Short Term Goal 3 (Week 2): Pt will complete 3/3 aspects of toileting with min A. OT Short Term Goal 4 (Week 2): Pt will groom standing sink side with CGA with no more than one rest break  for improved standing tolerance for ADLs.  Skilled Therapeutic Interventions/Progress Updates:    Pt received sitting in the w/c with no c/o pain, agreeable to OT session. Pt propelled w/c 100 ft to the therapy gym with supervision. Demo provided re squat pivot transfer technique- blocked practice of squat pivot x4 with min A overall, poor clearance with transfer being a hybrid between lateral scoot and squat pivot. Worked on standing from the mat with min-mod A, with cueing for hand placement. Mod A to remain standing with mod cueing for glute activation, upright posture, and LLE positioning. Discussed prosthetic prep positioning and limb education in general. Pt only able to remain standing for ~30 seconds at a time. Core stabilization exercises completed EOM to increase trunk and postural stability, as well as to increase upright posture. Pt returned to his w/c via squat pivot and then back to bed in his room, with mod A each time. Limb guard removed d/t pt complaint it was chaffing his calf- did a skin inspection and did not notice any areas of redness or breakdown. Pt was left supine with family members present.   Therapy Documentation Precautions:  Precautions Precautions: Fall Precaution Comments: charcot foot, wound VAC, LLE NWB Required Braces or Orthoses:  Other Brace Other Brace: Limb Guard Restrictions Weight Bearing Restrictions: Yes LLE Weight Bearing: Non weight bearing Other Position/Activity Restrictions: limb protector for mobility  Therapy/Group: Individual Therapy  Curtis Sites 05/26/2021, 6:39 AM

## 2021-05-26 NOTE — Progress Notes (Signed)
[facility] Progress Note    Assessment/ Plan:   ESRD: has been on HD due to issues with CCPD: inadequate drainage/UF (catheter malfunction?) Outpatient orders: [place], MWF, F180, 3hrs, 2k, 2.5cal, 137 na, 35bicarb, 400bfr, 16g, edw 86.5kg. hep 2k bolus - no plans for further PD and is being scheduled to have PD cath removed as an outpatient, therefore no PD cath flushing needed.  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 [name]. [name]    Volume/ hypertension: EDW 86.5kg pre amputation. Over EDW and with possible pulm edema - push UF as able but also hypotensive-  profile-  low temp and midodrine pre HD.   Makes urine-  dosing with lasix-  now some issues with urinary retention-   placed on flomax but still req in and out caths    Anemia of Chronic Kidney Disease: Hemoglobin 8.0. No active infection so gave 0.5g IV iron load over 4 HD treatments. Receives  aranesp here. Transfuse prn-  getting labs only on HD    Secondary Hyperparathyroidism/Hyperphosphatemia: decreased auryxia dose with phos running on low side, renal diet, monitor phos-  also on calcitriol, sensipar- getting labs only with HD   DM2 w/ hyperglycemia -mgmt per primary service  Hypoalbuminemia, protein calorie malnutrition -push protein    Feeling out of it-  on prm pain and nausea meds-  may need less of those-  is better    Laporche Martelle A [facility]    Subjective:   No complaints today in rehab-  feels better today as he slept-  is still re inand out caths daily-  due for dialysis later today    Objective:   BP 125/76 (BP Location: Right Arm)   Pulse 83   Temp 97.7 F (36.5 C)   Resp 18   Ht 5\' 11"  (1.803 m)   Wt 90.9 kg   SpO2 97%   BMI 27.95 kg/m    Intake/Output Summary (Last 24 hours) at 05/26/2021 1214 Last data filed at 05/26/2021 0756 Gross per 24 hour  Intake 440 ml  Output --  Net 440 ml   Weight change:   Physical Exam: Gen:nad, nontoxic appearing CVS:rrr Resp:normal wob on RA, a few basilar rales TDD:UKGUR RLE 1+ edema, LLE amputated with immobilizer on Neuro: sleeping Access: avf +b/t   Imaging: No results found.  Labs: BMET Recent Labs  Lab 05/24/21 1417  NA 131*  K 5.2*  CL 96*  CO2 28  GLUCOSE 161*  BUN 67*  CREATININE 5.31*  CALCIUM 9.1  PHOS 2.5   CBC Recent Labs  Lab 05/24/21 1417  WBC 7.7  HGB 8.3*  HCT 27.7*  MCV 104.5*  PLT 178    Medications:     aspirin EC  81 mg Oral Daily   atorvastatin  80 mg Oral Daily   calcitRIOL  0.25 mcg Oral BID   Chlorhexidine Gluconate Cloth  6 each Topical Q0600   Chlorhexidine Gluconate Cloth  6 each Topical Q0600   cinacalcet  30 mg Oral Q Mon   clopidogrel  75 mg Oral Daily   darbepoetin ([name]) injection - DIALYSIS  150 mcg Intravenous Q Wed-HD   feeding supplement (NEPRO CARB STEADY)  237 mL Oral QHS   ferric citrate  210 mg Oral TID with  meals   furosemide  80 mg Oral BID   gabapentin  200 mg Oral QHS   gentamicin cream   Topical Daily   heparin  5,000 Units Subcutaneous Q8H   insulin aspart  0-6 Units Subcutaneous TID WC   insulin glargine  7 Units Subcutaneous QHS   linaclotide  145 mcg Oral QAC breakfast   midodrine  10 mg Oral Q M,W,F-HD   multivitamin  1 tablet Oral QHS   nutrition supplement (JUVEN)  1 packet Oral BID BM   pantoprazole  40 mg Oral Daily   pentoxifylline  400 mg Oral Q breakfast   senna-docusate  1 tablet Oral BID   sorbitol  30 mL Oral BID   tamsulosin  0.8 mg Oral QHS   zolpidem  5 mg Oral QHS

## 2021-05-26 NOTE — Progress Notes (Signed)
PROGRESS NOTE   Subjective/Complaints:  Pt reports slept better- didn't take Seroquel or Klonopin.  Feels pretty good this AM Has some rubbing/rawness on back of L BKA/thigh from limb guuard rubbing.   They wanted to cath him at 6:20 am this morning- asked to do it later. Will change to 8pm nightly.    ROS:  Pt denies SOB, abd pain, CP, N/V/C/D, and vision changes    Objective:   No results found. Recent Labs    05/24/21 1417  WBC 7.7  HGB 8.3*  HCT 27.7*  PLT 178    Recent Labs    05/24/21 1417  NA 131*  K 5.2*  CL 96*  CO2 28  GLUCOSE 161*  BUN 67*  CREATININE 5.31*  CALCIUM 9.1     Intake/Output Summary (Last 24 hours) at 05/26/2021 0846 Last data filed at 05/26/2021 0756 Gross per 24 hour  Intake 560 ml  Output --  Net 560 ml        Physical Exam: Vital Signs Blood pressure 125/76, pulse 83, temperature 97.7 F (36.5 C), resp. rate 18, height 5\' 11"  (1.803 m), weight 90.9 kg, SpO2 97 %.       General: awake, alert, appropriate, sitting in w/c;  NAD HENT: conjugate gaze; oropharynx moist CV: regular rate; no JVD Pulmonary: CTA B/L; no W/R/R- good air movement GI: soft, NT, ND, (+)BS Psychiatric: appropriate; less sleepy- more perky Neurological: Ox3; more alert  Musculoskeletal:    Cervical back: Normal range of motion and neck supple.    Comments: Right heel boggy and red. R foot- Charcot food- has no arch- no actual skin broken down- scabs R calf scattered  Skin has foam on it on back of L posterior thigh  L BKA is shaping great- has staples intact- some dried blood noted- shrinker sticking to staples, however no active drainage seen- a little dried serous yellow spots, but no erythema- mild edema.  UE 5/5 B/L in biceps, triceps, WE< grip and FA Les- RLE- HF 4+/5, KF/KE 4/5, DF and PF 4+/5 LLE- HF and KE at least 3+/5- painful to do ROM Shaping OK- has shrinker on with  limb guard over it- C/D/I Skin: L BKA as above.     Comments: IV R forearm LUE fistula- can feel thrill Scattered scabs on R calf/shin Fallen arch on R foot/charcot foot R heel boggy - slightly less Neurological:    Mental Status: He is alert and oriented to person, place, and time.    Comments: Decreased to absent Sensation to light touch from R knee downwards  RUE twitching occ.    Assessment/Plan: 1. Functional deficits which require 3+ hours per day of interdisciplinary therapy in a comprehensive inpatient rehab setting. Physiatrist is providing close team supervision and 24 hour management of active medical problems listed below. Physiatrist and rehab team continue to assess barriers to discharge/monitor patient progress toward functional and medical goals  Care Tool:  Bathing    Body parts bathed by patient: Right arm, Chest, Left arm, Abdomen, Front perineal area, Face   Body parts bathed by helper: Right lower leg Body parts n/a: Left lower leg  Bathing assist Assist Level: Moderate Assistance - Patient 50 - 74%     Upper Body Dressing/Undressing Upper body dressing   What is the patient wearing?: Pull over shirt    Upper body assist Assist Level: Set up assist    Lower Body Dressing/Undressing Lower body dressing      What is the patient wearing?: Pants     Lower body assist Assist for lower body dressing: Maximal Assistance - Patient 25 - 49%     Toileting Toileting    Toileting assist Assist for toileting: Maximal Assistance - Patient 25 - 49%     Transfers Chair/bed transfer  Transfers assist     Chair/bed transfer assist level: Minimal Assistance - Patient > 75% Chair/bed transfer assistive device: Sliding board   Locomotion Ambulation   Ambulation assist   Ambulation activity did not occur: Safety/medical concerns          Walk 10 feet activity   Assist  Walk 10 feet activity did not occur: Safety/medical concerns         Walk 50 feet activity   Assist Walk 50 feet with 2 turns activity did not occur: Safety/medical concerns         Walk 150 feet activity   Assist Walk 150 feet activity did not occur: Safety/medical concerns         Walk 10 feet on uneven surface  activity   Assist Walk 10 feet on uneven surfaces activity did not occur: Safety/medical concerns         Wheelchair     Assist Will patient use wheelchair at discharge?: Yes Type of Wheelchair: Manual    Wheelchair assist level: Supervision/Verbal cueing Max wheelchair distance: 100'    Wheelchair 50 feet with 2 turns activity    Assist        Assist Level: Supervision/Verbal cueing   Wheelchair 150 feet activity     Assist      Assist Level: Maximal Assistance - Patient 25 - 49%   Blood pressure 125/76, pulse 83, temperature 97.7 F (36.5 C), resp. rate 18, height 5\' 11"  (1.803 m), weight 90.9 kg, SpO2 97 %.  Medical Problem List and Plan: 1.  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  Con't PT and OT- HD on M/W/F PM  -con't PT and OT- [name] 2.  Antithrombotics: -DVT/anticoagulation:  Pharmaceutical: Heparin             -antiplatelet therapy: ASA/Plavix 3. Pain Management: Change dilaudid to oxycodone prn.             --resume gabapentin to help manage phantom pain.  7/16- reduce Oxy to 2.5 to 5 mg q4 hours prn for pain and ocn't tramadol as well  7/17- pain still an issue- nerve pain and residual limb- on max dose of Gabapentin- will ask therapy to do mirror therapy.  7/18 no pain but very groggy and having tremors , reduce gabapentin to 400mg    7/20- pain doing OK even with reduction in gabapentin to 200 mg QHS- con't regimen 4. Mood: LCSW to follow for evaluation and support.                -  antipsychotic agents: N/A 5. Neuropsych: This patient is capable of making decisions on his own behalf. 6. Skin/Wound Care: Continue wound VAC             --On Zinc and Juven to help promote wound healing.  7/16- remove VAC- is due today-   7/17- has 1 blister, but looks OK- will monitor  7/21- skin looks better- dried blood- no erythema seen- con't shrinker- shaping w  7/22- had foam added for rubbing raw on back of posterior L thigh- will monitor 7. Fluids/Electrolytes/Nutrition: Strict I/O with daily weights. 8. R -charcot foot: Encourage wearing CAM boot with activity. 9. T2DM: Hgb A1C-8.2. Monitor BS ac/hs             --continue Lantus 5 units with SSI for elevated BS  CBG (last 3)  Recent Labs    05/25/21 1635 05/25/21 2100 05/26/21 0623  GLUCAP 163* 153* 149*   7/21- keeps having 1 high night time elevation >200- but 103-169 otherwise- con't regimen  7/22- looks much better 10. ESRD: HD MWF at the end of the day to facilitate therapy and endurance issues             --No plans to resume PD. Midodrine for BP support in hemo.  7/20- K+ 5.2 today- per renal- also got approval from renal to increase Flomax 7/21- Per renal for K+ 5.2.  11. CAD w/recent NSTEMI: Treated medically w/ASA, Plavix and Lipitor.   12. Acute on chronic anemia: Loaded with IV ferric gluconate --continue [name] 150 mc weekly with serial CBC to monitor for stability . 13. PAD: On trental, plavix and ASA. 14. Chronic constipation: Continue Sorbitol bid.  7/16- LBM Thursday- gave an addiitonal dose of Sorbitol since cannot give Mg citrate  7/17- LBM overnight- was hard- will con't Sorbitol BID- might need more as time goes on.   7/19- will add Linzess 145 mcg daily- starting tomorrow  7/20- will add Senokot 1 tab BID and con't miralax BID and Sorbitol BID for now- goal to d/c Sorbitol- con't Linzess- just started this AM  7/21- Had good BM, however still hard- change senokot to senokot-S for stool softener  benefits.  15. Hyperparathyroidism: Being managed with aruxia and Calcitriol.  16. Insomnia/RLS: Resume gabapentin for phantom pain as well as RLS.             --Klonopin prn for sleep  7/17- cannot use trazodone for sleep- causes urinary retention/makes it worse- will d/c prn trazodone- con't klonopin and add Melatonin- pt said not real helpful, but maybe with klonpin will help. .   7/19- slept poorly- will try Ambien 5 mg QHS- but no higher. Will monitor  7/20- much improved with Ambien- con't regimen  7/21- will increase Ambien to 10 mg QHS- Ok'd by pharmacy- and add Seroquel 25 mg QHS PRN since "mind racing"- cannot do Elavil?Trazodone due ot urinary retention.   7/22- slept much better with Ambien- 10 mg QHS- no klonopin needed and didn't take seroquel- has seroquel prn.  17 H/o  Depression/anxiety: Continue Lexapro (started a few weeks ago) and klonopin.  7/16- will decrease Klonopin to 0.25 mg BID prn since too sleepy/dazed this AM  7/17- d/c lexapro per pt request.  18. Cough 7/16- will check CXR-  shows likely atelectasis since WBC is normal and afebrile.      19. Restart meds for hyperparathyroidism-  77. Oliguria/Urinary retention  7/17- required cath for 500cc overnight; will ask to bladder scan q8 hours- usually  voided 1-2x/day per pt. Don't want him getting UTI. 7/20- will increase Flomax to 0.8 mg nightly- still requiring caths.  7/21- no change yet- con't regimen     7/22- will change cath to qday at 8pm daily.    LOS: 8 days A FACE TO FACE EVALUATION WAS PERFORMED  Jirah Rider 05/26/2021, 8:46 AM

## 2021-05-26 NOTE — Progress Notes (Signed)
Occupational Therapy Session Note  Patient Details  Name: Maxwell Harmon MRN: [id number] Date of Birth: 1956-01-04  Today's Date: 05/26/2021 OT Individual Time: 0700-0810 OT Individual Time Calculation (min): 70 min    Short Term Goals: Week 2:  OT Short Term Goal 1 (Week 2): Pt will complete LB dressing with min A and AE prn. OT Short Term Goal 2 (Week 2): Pt will complete functional transfers to all appropriate surfaces with CGA and LRAD. OT Short Term Goal 3 (Week 2): Pt will complete 3/3 aspects of toileting with min A. OT Short Term Goal 4 (Week 2): Pt will groom standing sink side with CGA with no more than one rest break  for improved standing tolerance for ADLs.  Skilled Therapeutic Interventions/Progress Updates:    Pt resting in bed upon arrival. Pt reported that he slept well during the night. Pt much more alert this morning and ready for therapy. Supine>sit EOB with supervision. Sitting balance supervision while changing shirt. Pt stated he wanted to complete SB transfer without assistance, including SB placement, this morning. Pt placed SB correctly and performed SB transfer to w/c with close supervision. Pt completed grooming tasks at sink before propelling to gym. W/c setup EOM with min verbal cues. SB transfer to [facility] with supervision. Sitting balance EOM for BUE activities with supervision. No lateral lean noted this morning. Pt engaged in rowing and chest presses with 5# bar 3x10 with rest breaks. Pt reported that he could "feel his muscles" and was slightly SOB. Pt also engaged in tossing 1kg ball against rebounder while seated EOM-3x10. Pt stated he wanted to attmept stand pivot transfer with RW when possible. Request related to OT and PT treating pt later in morning. SB transfer to w/c with supervsion. Pt requires assistance placing leg rest on w/c. Pt propelled back to room and remained seated in w/c. All needs within reach and belt alarm activated.  Therapy  Documentation Precautions:  Precautions Precautions: Fall Precaution Comments: charcot foot, wound VAC, LLE NWB Required Braces or Orthoses: Other Brace Other Brace: Limb Guard Restrictions Weight Bearing Restrictions: Yes LLE Weight Bearing: Non weight bearing Other Position/Activity Restrictions: limb protector for mobility Pain:  Pt initially reported that his pain was "not bad" but later reported tenderness on posterior aspect of Lt thigh; inspection revealed that top edge of limb guard was rubbing pt's thigh; RN notified and foam dressing applied; MD aware ADL: ADL Eating: Set up Where Assessed-Eating: Chair Grooming: Setup Where Assessed-Grooming: Sitting at sink Upper Body Bathing: Setup Where Assessed-Upper Body Bathing: Sitting at sink Lower Body Bathing: Not assessed (pt refused) Where Assessed-Lower Body Bathing: Sitting at sink Upper Body Dressing: Setup Where Assessed-Upper Body Dressing: Sitting at sink Lower Body Dressing: Maximal assistance Where Assessed-Lower Body Dressing: Sitting at sink Toileting: Maximal assistance Where Assessed-Toileting: Glass blower/designer: Psychiatric nurse Method: Theatre manager: Drop arm bedside commode Vision   Perception    Praxis   Exercises:   Other Treatments:     Therapy/Group: Individual Therapy  Leroy Libman 05/26/2021, 8:18 AM

## 2021-05-26 NOTE — Consult Note (Signed)
Neuropsychological Consultation   Patient:   Maxwell Harmon   DOB:   19-Apr-1956  MR Number:  [id number]  Location:  [facility] [address] C[facility] [address] [place] [id number] [place] [address] Dept: [place]: [phone]           Date of Service:   05/26/2021  Start Time:   10:30 AM End Time:   11:15 AM  Provider/Observer:  Ilean Skill, Psy.D.       Clinical Neuropsychologist       Billing Code/Service: [phone]  Chief Complaint:    Maxwell Harmon is a 65 year old male with a history of hypertension, type 2 diabetes, end-stage renal disease with hemodialysis, CAD status post CABG 2021 and NSTEMI 04/2021 revealing no options of revascularization, severe PAD with chronic left foot ulcer.  Patient was readmitted on 05/07/2021 with increased and foot pain, edema and increasing drainage.  Patient was started on broad-spectrum antibiotics and attempts were made to salvage limb.  Due to rapid deterioration with concerns of osteomyelitis, BKA recommended and the patient underwent procedure by Dr. Sharol Given on 7/8.  Patient continued to be limited by weakness, fatigue, reports standing balance secondary to BKA with [name] recommended due to functional decline.  Reason for Service:  Patient was referred for neuropsychological consultation due to coping and adjustment issues and increased depressive type symptomatology.  Below is the HPI for the current admission.  HPI: Maxwell Harmon is a 65 year old male (father in law of Dr. Evlyn Courier) with history of HTN, T2DM, ESRD- HD MWF, CAD s/p CABG 2021 and NSTEMI 04/2021 with cath 04/2021 revealing no options for revascularization, severe PAD s/p angioplasty with chronic left foot ulcer who was readmitted on 05/07/21 with increase in foot pain, edema and increase in drainage. He was started on broad spectrum antibiotics and Dr.Fields/Hawken consulted in attempts for limb salvage. Due to rapid  deterioration with concerns of osteomyelitis, BKA recommended and patient underwent said procedure by Dr. Sharol Given on 07/08. He was transfused with one unit PRBC pre-op and continues on HD with no plans for resumption of PD--catheter to be removed on outpatient basis. He has had issues with SOB/DOE with hypotension on 07/11 felt to be due to fluid overload and EDW challenged with addition of midodrine for BP support on HD days. 2D echo done showing EF 25-30% and Dr. Haroldine Laws consulted for input. He recommended optimization of fluid status per HD which has improved patient's symptoms. Therapy ongoing and patient continues to be limited by weakness, fatigue, right charcot foot and poor standing balance secondary to BKA. [name] recommended due to functional decline.  Current Status:  Upon entering the room, the patient was awake and alert sitting in his wheelchair with his left leg (site of BKA) elevated with protective apparatus applied.  Patient was clearly dysphoric and had times of crying spells during our conversation.  He was oriented with good mental status but admitted feeling overwhelmed with prolonged loss of function and needing other people to help him out to maintain his home and functioning.  Patient essentially was catastrophizing his status and perceiving his current medical status as being a permanent situation and having difficulty to acknowledge the availability of prostatic intervention in roughly 3 months.  The patient acknowledged past depressive symptomatology and the patient has been taking SSRI medications (Lexapro) with plan to continue this medication going forward.  Patient acknowledges having difficulty sleeping at night and ruminating and focusing  on his pain in his surgical site as well as phantom pain and other sensations.  Behavioral Observation: Maxwell Harmon  presents as a 65 y.o.-year-old Right Caucasian Male who appeared his stated age. his dress was Appropriate and he was Well  Groomed and his manners were Appropriate to the situation.  his participation was indicative of Appropriate and Redirectable behaviors.  There were physical disabilities noted.  he displayed an appropriate level of cooperation and motivation.     Interactions:    Active Appropriate and Redirectable  Attention:   abnormal and patient distracted by internal preoccupations  Memory:   within normal limits; recent and remote memory intact  Visuo-spatial:  not examined  Speech (Volume):  low  Speech:   normal; normal  Thought Process:  Coherent and Relevant  Though Content:  WNL; not suicidal and not homicidal  Orientation:   person, place, time/date, and situation  Judgment:   Fair  Planning:   Fair  Affect:    Depressed, Lethargic, and Tearful  Mood:    Dysphoric  Insight:   Fair  Intelligence:   normal  Medical History:   Past Medical History:  Diagnosis Date   Anemia of chronic disease    CAD (coronary artery disease)    Diabetes mellitus without complication ([place])    ESRD (end stage renal disease) on dialysis ([place])    MWF in [place]   History of bleeding ulcers    History of TIAs    Hypertension    NSTEMI (non-ST elevated myocardial infarction) ([place]) 04/2021   PAD (peripheral artery disease) ([place])          Patient Active Problem List   Diagnosis Date Noted   Below-knee amputation of left lower extremity ([place]) 05/18/2021   Acute on chronic systolic (congestive) heart failure ([place])    Ischemic ulcer of left heel ([place])    Cellulitis 05/07/2021   Ischemic ulcer diabetic foot ([place]) 05/07/2021   Critical lower limb ischemia ([place]) 04/20/2021   Sepsis ([place]) 04/20/2021   NSTEMI (non-ST elevated myocardial infarction) ([place]) 04/20/2021   Pressure injury of skin 04/11/2021   Acute heart failure ([place]) 04/10/2021   Uremia 04/10/2021   Gangrene of left foot ([place])    PAD (peripheral artery disease) ([place])    ESRD on hemodialysis ([place])    Dyspnea on exertion     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  Family Med/Psych History:  Family History  Problem Relation Age of Onset   Stroke Mother    Stroke Father    Cancer Father    Cancer Brother     Impression/DX:  Maxwell Harmon is a 65 year old male with a history of hypertension, type 2 diabetes, end-stage renal disease with hemodialysis, CAD status post CABG 2021 and NSTEMI 04/2021 revealing no options of revascularization, severe PAD with chronic left foot ulcer.  Patient was readmitted on 05/07/2021 with increased and foot pain, edema and increasing drainage.  Patient was started on broad-spectrum antibiotics and attempts were made to salvage limb.  Due to rapid deterioration with concerns of osteomyelitis, BKA recommended and the  patient underwent procedure by Dr. Sharol Given on 7/8.  Patient continued to be limited by weakness, fatigue, reports standing balance secondary to BKA with [name] recommended due to functional decline.  Upon entering the room, the patient was awake and alert sitting in his wheelchair with his left leg (site of BKA) elevated with protective apparatus applied.  Patient was clearly dysphoric and had times of crying spells during our conversation.  He was oriented with good mental status but admitted feeling overwhelmed with prolonged loss of function and needing other people to help him out to maintain his home and functioning.  Patient essentially was catastrophizing his status and perceiving his current medical status as being a permanent situation and having difficulty to acknowledge the  availability of prostatic intervention in roughly 3 months.  The patient acknowledged past depressive symptomatology and the patient has been taking SSRI medications (Lexapro) with plan to continue this medication going forward.  Patient acknowledges having difficulty sleeping at night and ruminating and focusing on his pain in his surgical site as well as phantom pain and other sensations.  Disposition/Plan:  Today we worked on coping adjustment issues with recent left BKA and adjustment issues including depressive symptomatology.         Electronically Signed   _______________________ Ilean Skill, Psy.D. Clinical Neuropsychologist

## 2021-05-26 NOTE — Progress Notes (Signed)
Physical Therapy Session Note  Patient Details  Name: Maxwell Harmon MRN: [id number] Date of Birth: Dec 18, 1955  Today's Date: 05/26/2021 PT Individual Time: 0900-0948 PT Individual Time Calculation (min): 48 min   Short Term Goals: Week 1:  PT Short Term Goal 1 (Week 1): Pt will initiate gait training with LRAD PT Short Term Goal 2 (Week 1): Pt will perform lateral transfer with CGA PT Short Term Goal 3 (Week 1): Pt will be mod I with w/c propulsion up to 150 ft Week 2:     Skilled Therapeutic Interventions/Progress Updates:    PAIN pt denies pain this am, reports phantom sensations but not painful.  Pt initially oob in wc.  Wc propulsion x 153ft w/bilat Ues, slow cadence, fatigues quickly, transported to gym. Pt educated re: squat pivot wc to/from mat then performed w/set up and min assist w/good clearance achieved.  Noted poor functioning of brakes on current wc.  Therapist obtained new wc w/functional brakes, legrests and cushion maintained from prior wc.  Repeated Sit to stand to RW from slightly elevated mat w/cues for sequencing, use of momentum, proper mechanics then repeats x 5 for functional strengthening, overall min assist.  Seated LLE LAQs x 15 w/emphasis on achieving full extension.  Discussed prevention of joint/soft tissue tightness at hip and knee in prep of anticipated use of prosthesis.  Pt voiced understanding. Sit to supine to R sidelying w/min assist. Sidelying hip flexion+extension+abduction sequence repeated x 15 w/tactile cues for isolation and emphaiss on extension. Supine TKEs x 20, hip abd/add x 20, SLR x 15  Supine to sit w/min assist.  Limbguard repositioned by therapist.  Squat pivot mat to wc w/set up and min assist. Wc propulsion x 39ft, slow progress, transported to room.   Pt left oob in wc w/alarm belt set and needs in reach   Therapy Documentation Precautions:  Precautions Precautions: Fall Precaution Comments: charcot foot, wound VAC, LLE  NWB Required Braces or Orthoses: Other Brace Other Brace: Limb Guard Restrictions Weight Bearing Restrictions: Yes LLE Weight Bearing: Non weight bearing Other Position/Activity Restrictions: limb protector for mobility     Therapy/Group: Individual Therapy Callie Fielding, [place] 05/26/2021, 12:46 PM

## 2021-05-27 LAB — GLUCOSE, CAPILLARY
Glucose-Capillary: 113 mg/dL — ABNORMAL HIGH (ref 70–99)
Glucose-Capillary: 143 mg/dL — ABNORMAL HIGH (ref 70–99)
Glucose-Capillary: 172 mg/dL — ABNORMAL HIGH (ref 70–99)
Glucose-Capillary: 167 mg/dL — ABNORMAL HIGH (ref 70–99)

## 2021-05-27 MED ORDER — ZOLPIDEM TARTRATE 5 MG PO TABS
10.0000 mg | ORAL_TABLET | Freq: Every day | ORAL | Status: DC
  Administered 2021-05-27 – 2021-06-01 (×6): 10 mg via ORAL
  Filled 2021-05-27 (×8): qty 2

## 2021-05-27 NOTE — Progress Notes (Signed)
[facility] Progress Note    Assessment/ Plan:   ESRD: has been on HD due to issues with CCPD: inadequate drainage/UF (catheter malfunction?) Outpatient orders: [place], MWF, F180, 3hrs, 2k, 2.5cal, 137 na, 35bicarb, 400bfr, 16g, edw 86.5kg. hep 2k bolus - no plans for further PD and is being scheduled to have PD cath removed as an outpatient, therefore no PD cath flushing needed.  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 [name]. [name]    Volume/ hypertension: EDW 86.5kg pre amputation. Over EDW and with possible pulm edema - push UF as able but also hypotensive-  profile-  low temp and midodrine pre HD.   Makes urine-  dosing with lasix to help to get to EDW -  now some issues with urinary retention-   placed on flomax but still req in and out caths    Anemia of Chronic Kidney Disease: Hemoglobin 8.0. No active infection so gave 0.5g IV iron load over 4 HD treatments. On aranesp here. Transfuse prn-  getting labs only on HD    Secondary Hyperparathyroidism/Hyperphosphatemia: decreased auryxia dose with phos running on low side-  latest under 2 so will stop -  also on calcitriol, sensipar- getting labs only with HD   DM2 w/ hyperglycemia -mgmt per primary service  Hypoalbuminemia, protein calorie malnutrition -push protein    Feeling out of it-  on prm pain and nausea meds-  may need less of those-  is better    Maxwell Harmon [facility]    Subjective:   HD yest-  removed 1500-  still above EDW-  resting today    Objective:   BP 108/66 (BP Location: Right Arm)   Pulse 90   Temp 97.9 F (36.6 C) (Oral)   Resp (!) 21   Ht 5\' 11"  (1.803 m)   Wt 93 kg   SpO2 95%   BMI 28.60 kg/m   Intake/Output Summary (Last 24 hours) at 05/27/2021 9702 Last  data filed at 05/27/2021 0700 Gross per 24 hour  Intake 600 ml  Output 2558 ml  Net -1958 ml   Weight change:   Physical Exam: Gen:nad, nontoxic appearing CVS:rrr Resp:normal wob on RA, a few basilar rales OVZ:CHYIF RLE 1+ edema, LLE amputated with immobilizer on Neuro: sleeping Access: avf +b/t   Imaging: No results found.  Labs: BMET Recent Labs  Lab 05/24/21 1417 05/26/21 2048  NA 131* 133*  K 5.2* 4.2  CL 96* 95*  CO2 28 31  GLUCOSE 161* 205*  BUN 67* 39*  CREATININE 5.31* 3.46*  CALCIUM 9.1 9.1  PHOS 2.5 1.6*   CBC Recent Labs  Lab 05/24/21 1417 05/26/21 2048  WBC 7.7 5.8  HGB 8.3* 8.8*  HCT 27.7* 28.2*  MCV 104.5* 104.1*  PLT 178 169    Medications:     aspirin EC  81 mg Oral Daily   atorvastatin  80 mg Oral Daily   calcitRIOL  0.25 mcg Oral BID   Chlorhexidine Gluconate Cloth  6 each Topical Q0600   Chlorhexidine Gluconate Cloth  6 each Topical Q0600   cinacalcet  30 mg Oral Q Mon   clopidogrel  75 mg Oral Daily   darbepoetin ([name]) injection - DIALYSIS  150 mcg Intravenous Q Wed-HD   feeding supplement (NEPRO CARB STEADY)  237 mL Oral QHS   ferric citrate  210 mg Oral TID with meals   furosemide  80 mg Oral BID   gabapentin  200 mg Oral QHS   gentamicin cream   Topical Daily   heparin  5,000 Units Subcutaneous Q8H   insulin aspart  0-6 Units Subcutaneous TID WC   insulin glargine  7 Units Subcutaneous QHS   linaclotide  145 mcg Oral QAC breakfast   midodrine  10 mg Oral Q M,W,F-HD   multivitamin  1 tablet Oral QHS   nutrition supplement (JUVEN)  1 packet Oral BID BM   pantoprazole  40 mg Oral Daily   pentoxifylline  400 mg Oral Q breakfast   senna-docusate  1 tablet Oral BID   sorbitol  30 mL Oral BID   tamsulosin  0.8 mg Oral QHS   zolpidem  10 mg Oral QHS

## 2021-05-27 NOTE — Progress Notes (Signed)
PROGRESS NOTE   Subjective/Complaints:  Poor sleep, has had problems with this in the past and used clonazepam , has this on prn basis, received ambien 5mg  last noc  Denies stump or phantom pain at night   ROS:  Pt denies SOB, abd pain, CP, N/V/C/D, and vision changes    Objective:   No results found. Recent Labs    05/24/21 1417 05/26/21 2048  WBC 7.7 5.8  HGB 8.3* 8.8*  HCT 27.7* 28.2*  PLT 178 169     Recent Labs    05/24/21 1417 05/26/21 2048  NA 131* 133*  K 5.2* 4.2  CL 96* 95*  CO2 28 31  GLUCOSE 161* 205*  BUN 67* 39*  CREATININE 5.31* 3.46*  CALCIUM 9.1 9.1      Intake/Output Summary (Last 24 hours) at 05/27/2021 0650 Last data filed at 05/26/2021 2155 Gross per 24 hour  Intake 600 ml  Output 2558 ml  Net -1958 ml         Physical Exam: Vital Signs Blood pressure 108/66, pulse 90, temperature 97.9 F (36.6 C), temperature source Oral, resp. rate (!) 21, height 5\' 11"  (1.803 m), weight 93 kg, SpO2 95 %.       General: No acute distress Mood and affect are appropriate Heart: Regular rate and rhythm no rubs murmurs or extra sounds Lungs: Clear to auscultation, breathing unlabored, no rales or wheezes Abdomen: Positive bowel sounds, soft nontender to palpation, nondistended Extremities: No clubbing, cyanosis, or edema Skin: No evidence of breakdown, no evidence of rash    Musculoskeletal:    Cervical back: Normal range of motion and neck supple.    Comments: Right heel boggy and red. R foot- Charcot food- has no arch- no actual skin broken down- scabs R calf scattered  Skin has foam on it on back of L posterior thigh  L BKA is shaping great- has staples intact- some dried blood noted- shrinker sticking to staples, however no active drainage seen- a little dried serous yellow spots, but no erythema- mild edema.  UE 5/5 B/L in biceps, triceps, WE< grip and FA Les- RLE-  HF 4+/5, KF/KE 4/5, DF and PF 4+/5 LLE- HF and KE at least 3+/5- painful to do ROM Shaping OK- has shrinker on with limb guard over it- C/D/I Skin: L BKA as above.     Comments: IV R forearm LUE fistula- can feel thrill Scattered scabs on R calf/shin Fallen arch on R foot/charcot foot R heel boggy - slightly less Neurological:    Mental Status: He is alert and oriented to person, place, and time.    Comments: Decreased to absent Sensation to light touch from R knee downwards  RUE twitching occ.    Assessment/Plan: 1. Functional deficits which require 3+ hours per day of interdisciplinary therapy in a comprehensive inpatient rehab setting. Physiatrist is providing close team supervision and 24 hour management of active medical problems listed below. Physiatrist and rehab team continue to assess barriers to discharge/monitor patient progress toward functional and medical goals  Care Tool:  Bathing    Body parts bathed by patient: Right arm, Chest, Left arm, Abdomen,  Front perineal area, Face   Body parts bathed by helper: Right lower leg Body parts n/a: Left lower leg   Bathing assist Assist Level: Moderate Assistance - Patient 50 - 74%     Upper Body Dressing/Undressing Upper body dressing   What is the patient wearing?: Pull over shirt    Upper body assist Assist Level: Set up assist    Lower Body Dressing/Undressing Lower body dressing      What is the patient wearing?: Pants     Lower body assist Assist for lower body dressing: Maximal Assistance - Patient 25 - 49%     Toileting Toileting    Toileting assist Assist for toileting: Maximal Assistance - Patient 25 - 49%     Transfers Chair/bed transfer  Transfers assist     Chair/bed transfer assist level: Minimal Assistance - Patient > 75% Chair/bed transfer assistive device: Sliding board   Locomotion Ambulation   Ambulation assist   Ambulation activity did not occur: Safety/medical concerns           Walk 10 feet activity   Assist  Walk 10 feet activity did not occur: Safety/medical concerns        Walk 50 feet activity   Assist Walk 50 feet with 2 turns activity did not occur: Safety/medical concerns         Walk 150 feet activity   Assist Walk 150 feet activity did not occur: Safety/medical concerns         Walk 10 feet on uneven surface  activity   Assist Walk 10 feet on uneven surfaces activity did not occur: Safety/medical concerns         Wheelchair     Assist Will patient use wheelchair at discharge?: Yes Type of Wheelchair: Manual    Wheelchair assist level: Supervision/Verbal cueing Max wheelchair distance: 100'    Wheelchair 50 feet with 2 turns activity    Assist        Assist Level: Supervision/Verbal cueing   Wheelchair 150 feet activity     Assist      Assist Level: Maximal Assistance - Patient 25 - 49%   Blood pressure 108/66, pulse 90, temperature 97.9 F (36.6 C), temperature source Oral, resp. rate (!) 21, height 5\' 11"  (1.803 m), weight 93 kg, SpO2 95 %.  Medical Problem List and Plan: 1.  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  Con't PT and OT- HD on M/W/F PM  -con't PT and OT- [name] 2.  Antithrombotics: -DVT/anticoagulation:  Pharmaceutical: Heparin             -antiplatelet therapy: ASA/Plavix 3. Pain Management: Change dilaudid to oxycodone prn.             --resume gabapentin to help manage phantom pain.  7/16- reduce Oxy to 2.5 to 5 mg q4 hours prn for pain and ocn't tramadol as well  7/17- pain still an issue- nerve pain and residual limb- on max dose of Gabapentin- will ask therapy to do mirror therapy.  7/18 no pain but very groggy and having tremors , reduce gabapentin to 400mg    7/20- pain doing OK  even with reduction in gabapentin to 200 mg QHS- con't regimen 7/21 4. Mood: LCSW to follow for evaluation and support.               -antipsychotic agents: N/A 5. Neuropsych: This patient is capable of making decisions on his own behalf. 6. Skin/Wound Care: Continue wound VAC             --On Zinc and Juven to help promote wound healing.  7/16- remove VAC- is due today-   7/17- has 1 blister, but looks OK- will monitor  7/21- skin looks better- dried blood- no erythema seen- con't shrinker- shaping w  7/22- had foam added for rubbing raw on back of posterior L thigh- will monitor 7. Fluids/Electrolytes/Nutrition: Strict I/O with daily weights. 8. R -charcot foot: Encourage wearing CAM boot with activity. 9. T2DM: Hgb A1C-8.2. Monitor BS ac/hs             --continue Lantus 5 units with SSI for elevated BS  CBG (last 3)  Recent Labs    05/26/21 1213 05/26/21 2119 05/27/21 0601  GLUCAP 161* 180* 113*    7/21- keeps having 1 high night time elevation >200- but 103-169 otherwise- con't regimen  7/23 controlled  10. ESRD: HD MWF at the end of the day to facilitate therapy and endurance issues             --No plans to resume PD. Midodrine for BP support in hemo.  7/20- K+ 5.2 today- per renal- also got approval from renal to increase Flomax 7/21- Per renal for K+ 5.2.  Managed by nephro 11. CAD w/recent NSTEMI: Treated medically w/ASA, Plavix and Lipitor.   12. Acute on chronic anemia: Loaded with IV ferric gluconate --continue [name] 150 mc weekly with serial CBC to monitor for stability . 13. PAD: On trental, plavix and ASA. 14. Chronic constipation: Continue Sorbitol bid.  7/16- LBM Thursday- gave an addiitonal dose of Sorbitol since cannot give Mg citrate  7/17- LBM overnight- was hard- will con't Sorbitol BID- might need more as time goes on.   7/19- will add Linzess 145 mcg daily- starting tomorrow  7/20- will add Senokot 1 tab BID and con't miralax BID and Sorbitol BID for  now- goal to d/c Sorbitol- con't Linzess- just started this AM  7/21- Had good BM, however still hard- change senokot to senokot-S for stool softener benefits.  15. Hyperparathyroidism: Being managed with aruxia and Calcitriol.  16. Insomnia/RLS: Resume gabapentin for phantom pain as well as RLS.             --Klonopin prn for sleep  7/17- cannot use trazodone for sleep- causes urinary retention/makes it worse- will d/c prn trazodone- con't klonopin and add Melatonin- pt said not real helpful, but maybe with klonpin will help. .   7/19- slept poorly- will try Ambien 5 mg QHS- but no higher. Will monitor  7/20- much improved with Ambien- con't regimen  7/21- will increase Ambien to 10 mg QHS- Ok'd by pharmacy- and add Seroquel 25 mg QHS PRN since "mind racing"- cannot do Elavil?Trazodone due ot urinary retention.   7/22- slept much better with Ambien- 10 mg QHS- no klonopin needed and didn't take seroquel- has seroquel prn.  17 H/o  Depression/anxiety: Continue Lexapro (started a few weeks ago) and klonopin.  7/16- will decrease Klonopin to 0.25 mg BID prn since too sleepy/dazed this AM  7/17- d/c lexapro per pt request.  Did not sleep at all last noc  after ambien 5mg  will increase to 10mg   18. Cough 7/16- will check CXR-  shows likely atelectasis since WBC is normal and afebrile.      19. Restart meds for hyperparathyroidism-  40. Oliguria/Urinary retention  7/17- required cath for 500cc overnight; will ask to bladder scan q8 hours- usually voided 1-2x/day per pt. Don't want him getting UTI. 7/20- will increase Flomax to 0.8 mg nightly- still requiring caths.  7/21- no change yet- con't regimen     7/22- will change cath to qday at 8pm daily.    LOS: 9 days A FACE TO FACE EVALUATION WAS PERFORMED  Charlett Blake 05/27/2021, 6:50 AM

## 2021-05-27 NOTE — Progress Notes (Signed)
Patient c/o swelling left arm where fistula is located. No pain; no tingling ; no numbness noted. It was accessed in HD yesterday. Elevated arm with pillows. Will continue to monitor.

## 2021-05-27 NOTE — Progress Notes (Signed)
Physical Therapy Session Note  Patient Details  Name: Maxwell Harmon MRN: [id number] Date of Birth: 1956/02/11  Today's Date: 05/27/2021 PT Individual Time: 1422-1515 PT Individual Time Calculation (min): 53 min   Short Term Goals: Week 1:  PT Short Term Goal 1 (Week 1): Pt will initiate gait training with LRAD PT Short Term Goal 2 (Week 1): Pt will perform lateral transfer with CGA PT Short Term Goal 3 (Week 1): Pt will be mod I with w/c propulsion up to 150 ft  Skilled Therapeutic Interventions/Progress Updates:    Patient in w/c in room with wife and son-in-law in room.  Reports L UE swelling and not sleeping well, but will do his best to work.  Patient propelled w/c to ortho gym x 100' with S.  Donned limb guard in sitting with max a.  Patient performed slide board transfer after setting up w/c with close S and cues with min A.  Patient sit<>stand x 4 with elevated mat to RW with min A standing about 30 sec each trial with cues and facilitation for R knee extension.  Patient sit to supine with min a and performed LE therex including quad sets, SAQ, hip adductor squeezes w/ 5 sec hold, single leg bridge on R with SLR on L, sidelying on R for L hip extension and abduction all x 10.  Patient rolled with min A and performed side to sit with min A.  Patient transferred to w/c with slide board and min A.  Patient propelled to room 100' with performed squat pivot to bed with rail and min A mod cues.  Sit to supine with S after limb guard removed.  Left supine with wife in the room, bed alarm set and call bell in reach.   Therapy Documentation Precautions:  Precautions Precautions: Fall Precaution Comments: charcot foot, wound VAC, LLE NWB Required Braces or Orthoses: Other Brace Other Brace: Limb Guard Restrictions Weight Bearing Restrictions: Yes LLE Weight Bearing: Non weight bearing Other Position/Activity Restrictions: limb protector for mobility  Pain: Pain Assessment Pain Scale:  0-10 Pain Score: 0-No pain Faces Pain Scale: Hurts even more Pain Type: Surgical pain Pain Location: Leg Pain Orientation: Right Pain Descriptors / Indicators: Aching Pain Frequency: Intermittent Pain Onset: With Activity Pain Intervention(s): RN made aware;Repositioned;Rest    Therapy/Group: Individual Therapy  Reginia Naas [place], [place] 05/27/2021, 5:29 PM

## 2021-05-28 LAB — GLUCOSE, CAPILLARY
Glucose-Capillary: 103 mg/dL — ABNORMAL HIGH (ref 70–99)
Glucose-Capillary: 117 mg/dL — ABNORMAL HIGH (ref 70–99)
Glucose-Capillary: 210 mg/dL — ABNORMAL HIGH (ref 70–99)
Glucose-Capillary: 196 mg/dL — ABNORMAL HIGH (ref 70–99)

## 2021-05-28 MED ORDER — CHLORHEXIDINE GLUCONATE CLOTH 2 % EX PADS
6.0000 | MEDICATED_PAD | Freq: Every day | CUTANEOUS | Status: DC
Start: 2021-05-28 — End: 2021-06-07
  Administered 2021-05-29 – 2021-06-07 (×7): 6 via TOPICAL

## 2021-05-28 NOTE — Progress Notes (Signed)
Dressing changed done. Noted fluid filled blisters in some areas. Secured chat sent to MD to follow in am.

## 2021-05-28 NOTE — Plan of Care (Signed)
  Problem: RH SKIN INTEGRITY Goal: RH STG MAINTAIN SKIN INTEGRITY WITH ASSISTANCE Description: STG Maintain Skin Integrity With Supervision Assistance. Outcome: Not Progressing; c/o swelling left arm (fistula location) yesterday ; elevated with pillows and swelling decreased today; Renal MD notified while doing rounds

## 2021-05-28 NOTE — Progress Notes (Signed)
PROGRESS NOTE   Subjective/Complaints:  Slept very well last noc, a bit groggy this am . Appreciate nephro note   ROS:  Pt denies SOB, abd pain, CP, N/V/C/D, and vision changes    Objective:   No results found. Recent Labs    05/26/21 2048  WBC 5.8  HGB 8.8*  HCT 28.2*  PLT 169     Recent Labs    05/26/21 2048  NA 133*  K 4.2  CL 95*  CO2 31  GLUCOSE 205*  BUN 39*  CREATININE 3.46*  CALCIUM 9.1      Intake/Output Summary (Last 24 hours) at 05/28/2021 0635 Last data filed at 05/28/2021 0548 Gross per 24 hour  Intake 980 ml  Output 500 ml  Net 480 ml         Physical Exam: Vital Signs Blood pressure 103/73, pulse 78, temperature 98.6 F (37 C), resp. rate 16, height 5\' 11"  (1.803 m), weight 93 kg, SpO2 95 %.  General: No acute distress Mood and affect are appropriate Heart: Regular rate and rhythm no rubs murmurs or extra sounds Lungs: Clear to auscultation, breathing unlabored, no rales or wheezes Abdomen: Positive bowel sounds, soft nontender to palpation, nondistended Extremities: No clubbing, cyanosis, or edema Skin: No evidence of breakdown, no evidence of rash  Musculoskeletal:    Cervical back: Normal range of motion and neck supple.    Comments: Right heel boggy and red. R foot- Charcot food- has no arch- no actual skin broken down- scabs R calf scattered  Skin has foam on it on back of L posterior thigh  L BKA is shaping great- has staples intact- some dried blood noted- shrinker sticking to staples, however no active drainage seen- a little dried serous yellow spots, but no erythema- mild edema.  UE 5/5 B/L in biceps, triceps, WE< grip and FA Les- RLE- HF 4+/5, KF/KE 4/5, DF and PF 4+/5 LLE- HF and KE at least 3+/5- no pain with movement today  Shaping OK- has shrinker on with limb guard over it- C/D/I Skin: L BKA as above.     Comments: IV R forearm LUE fistula-   Scattered scabs on R calf/shin Fallen arch on R foot/charcot foot R heel boggy - slightly less Neurological:    Mental Status: He is alert and oriented to person, place, and time.    Comments: Decreased to absent Sensation to light touch from R knee downwards  RUE twitching occ.    Assessment/Plan: 1. Functional deficits which require 3+ hours per day of interdisciplinary therapy in a comprehensive inpatient rehab setting. Physiatrist is providing close team supervision and 24 hour management of active medical problems listed below. Physiatrist and rehab team continue to assess barriers to discharge/monitor patient progress toward functional and medical goals  Care Tool:  Bathing    Body parts bathed by patient: Right arm, Chest, Left arm, Abdomen, Front perineal area, Face   Body parts bathed by helper: Right lower leg Body parts n/a: Left lower leg   Bathing assist Assist Level: Moderate Assistance - Patient 50 - 74%     Upper Body Dressing/Undressing Upper body dressing  What is the patient wearing?: Pull over shirt    Upper body assist Assist Level: Set up assist    Lower Body Dressing/Undressing Lower body dressing      What is the patient wearing?: Pants     Lower body assist Assist for lower body dressing: Maximal Assistance - Patient 25 - 49%     Toileting Toileting    Toileting assist Assist for toileting: Maximal Assistance - Patient 25 - 49%     Transfers Chair/bed transfer  Transfers assist     Chair/bed transfer assist level: Minimal Assistance - Patient > 75% Chair/bed transfer assistive device: Sliding board   Locomotion Ambulation   Ambulation assist   Ambulation activity did not occur: Safety/medical concerns          Walk 10 feet activity   Assist  Walk 10 feet activity did not occur: Safety/medical concerns        Walk 50 feet activity   Assist Walk 50 feet with 2 turns activity did not occur: Safety/medical  concerns         Walk 150 feet activity   Assist Walk 150 feet activity did not occur: Safety/medical concerns         Walk 10 feet on uneven surface  activity   Assist Walk 10 feet on uneven surfaces activity did not occur: Safety/medical concerns         Wheelchair     Assist Will patient use wheelchair at discharge?: Yes Type of Wheelchair: Manual    Wheelchair assist level: Supervision/Verbal cueing Max wheelchair distance: 100'    Wheelchair 50 feet with 2 turns activity    Assist        Assist Level: Supervision/Verbal cueing   Wheelchair 150 feet activity     Assist      Assist Level: Maximal Assistance - Patient 25 - 49%   Blood pressure 103/73, pulse 78, temperature 98.6 F (37 C), resp. rate 16, height 5\' 11"  (1.803 m), weight 93 kg, SpO2 95 %.  Medical Problem List and Plan: 1.  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  Con't PT and OT- HD on M/W/F PM  -con't PT and OT- [name] 2.  Antithrombotics: -DVT/anticoagulation:  Pharmaceutical: Heparin             -antiplatelet therapy: ASA/Plavix 3. Pain Management: Change dilaudid to oxycodone prn.             --resume gabapentin to help manage phantom pain.  7/16- reduce Oxy to 2.5 to 5 mg q4 hours prn for pain and ocn't tramadol as well  7/17- pain still an issue- nerve pain and residual limb- on max dose of Gabapentin- will ask therapy to do mirror therapy.  7/18 no pain but very groggy and having tremors , reduce gabapentin to 400mg    7/20- pain doing OK even with reduction in gabapentin to 200 mg QHS- con't regimen 7/21 4. Mood: LCSW to follow for evaluation and support.               -antipsychotic agents: N/A 5. Neuropsych: This patient is capable of making decisions on his  own behalf. 6.  Skin/Wound Care: Continue wound VAC             --On Zinc and Juven to help promote wound healing.  7/16- remove VAC- is due today-   7/17- has 1 blister, but looks OK- will monitor  7/21- skin looks better- dried blood- no erythema seen- con't shrinker- shaping w  7/22- had foam added for rubbing raw on back of posterior L thigh- will monitor 7. Fluids/Electrolytes/Nutrition: Strict I/O with daily weights. 8. R -charcot foot: Encourage wearing CAM boot with activity. 9. T2DM: Hgb A1C-8.2. Monitor BS ac/hs             --continue Lantus 5 units with SSI for elevated BS  CBG (last 3)  Recent Labs    05/27/21 1644 05/27/21 2015 05/28/21 0542  GLUCAP 172* 167* 103*    7/21- keeps having 1 high night time elevation >200- but 103-169 otherwise- con't regimen  7/23 controlled  10. ESRD: HD MWF at the end of the day to facilitate therapy and endurance issues             --No plans to resume PD. Midodrine for BP support in hemo.  7/20- K+ 5.2 today- per renal- also got approval from renal to increase Flomax 7/21- Per renal for K+ 5.2.  Managed by nephro 11. CAD w/recent NSTEMI: Treated medically w/ASA, Plavix and Lipitor.   12. Acute on chronic anemia: Loaded with IV ferric gluconate --continue [name] 150 mc weekly with serial CBC to monitor for stability . 13. PAD: On trental, plavix and ASA. 14. Chronic constipation: Continue Sorbitol bid.  7/16- LBM Thursday- gave an addiitonal dose of Sorbitol since cannot give Mg citrate  7/17- LBM overnight- was hard- will con't Sorbitol BID- might need more as time goes on.   7/19- will add Linzess 145 mcg daily- starting tomorrow  7/20- will add Senokot 1 tab BID and con't miralax BID and Sorbitol BID for now- goal to d/c Sorbitol- con't Linzess- just started this AM  7/21- Had good BM, however still hard- change senokot to senokot-S for stool softener benefits.  15. Hyperparathyroidism: Being managed with aruxia and Calcitriol.  16. Insomnia/RLS:  Resume gabapentin for phantom pain as well as RLS.             --Klonopin prn for sleep  7/17- cannot use trazodone for sleep- causes urinary retention/makes it worse- will d/c prn trazodone- con't klonopin and add Melatonin- pt said not real helpful, but maybe with klonpin will help. .   7/19- slept poorly- will try Ambien 5 mg QHS- but no higher. Will monitor  7/20- much improved with Ambien- con't regimen  7/21- will increase Ambien to 10 mg QHS- Ok'd by pharmacy- and add Seroquel 25 mg QHS PRN since "mind racing"- cannot do Elavil?Trazodone due ot urinary retention.   7/22- slept much better with Ambien- 10 mg QHS- no klonopin needed and didn't take seroquel- has seroquel prn.  17 H/o  Depression/anxiety: Continue Lexapro (started a few weeks ago) and klonopin.  7/16- will decrease Klonopin to 0.25 mg BID prn since too sleepy/dazed this AM  7/17- d/c lexapro per pt request.  Did not sleep at all last noc after ambien 5mg  will increase to 10mg  - slept much better 7/24 18. Cough 7/16- will check CXR-  shows likely atelectasis since WBC is normal and afebrile.      9. Restart meds for hyperparathyroidism-  28. Oliguria/Urinary retention  7/17- required cath for 500cc  overnight; will ask to bladder scan q8 hours- usually voided 1-2x/day per pt. Don't want him getting UTI. 7/20- will increase Flomax to 0.8 mg nightly- still requiring caths.  7/21- no change yet- con't regimen     7/22- will change cath to qday at 8pm daily.  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

## 2021-05-28 NOTE — Progress Notes (Signed)
[facility] Progress Note    Assessment/ Plan:   ESRD: has been on HD due to issues with CCPD: inadequate drainage/UF (catheter malfunction?) Outpatient orders: [facility], MWF, F180, 3hrs, 2k, 2.5cal, 137 na, 35bicarb, 400bfr, 16g, edw 86.5kg. hep 2k bolus - no plans for further PD and is being scheduled to have PD cath removed as an outpatient, therefore no PD cath flushing needed.  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 [name]. [name]    Volume/ hypertension: EDW 86.5kg pre amputation. Over EDW and with possible pulm edema - push UF as able but also hypotensive-  profile-  low temp and midodrine pre HD.   Makes urine-  dosing with lasix to help to get to EDW -  now some issues with urinary retention-   placed on flomax but still req in and out caths    Anemia of Chronic Kidney Disease: Hemoglobin 8.0. No active infection so gave 0.5g IV iron load over 4 HD treatments. On aranesp here. Transfuse prn-  getting labs only on HD    Secondary Hyperparathyroidism/Hyperphosphatemia: stopped auryxia dose with phos running on low side-   -  also on calcitriol, sensipar- getting labs only with HD   DM2 w/ hyperglycemia -mgmt per primary service  Hypoalbuminemia, protein calorie malnutrition -push protein    Feeling out of it-  on prm pain and nausea meds-  may need less of those-  is better    [facility]    Subjective:   Arm swelling -  otherwise OK- has slept well over weekend    Objective:   BP 103/73 (BP Location: Right Arm)   Pulse 78   Temp 98.6 F (37 C)   Resp 16   Ht 5\' 11"  (1.803 m)   Wt 93 kg   SpO2 95%   BMI 28.60 kg/m   Intake/Output Summary (Last 24 hours) at 05/28/2021 1014 Last data filed at 05/28/2021 0755 Gross per 24  hour  Intake 1020 ml  Output 500 ml  Net 520 ml   Weight change:   Physical Exam: Gen:nad, nontoxic appearing CVS:rrr Resp:normal wob on RA, a few basilar rales LHT:DSKAJ RLE 1+ edema, LLE amputated with immobilizer on Neuro: sleeping Access: avf +b/t   Imaging: No results found.  Labs: BMET Recent Labs  Lab 05/24/21 1417 05/26/21 2048  NA 131* 133*  K 5.2* 4.2  CL 96* 95*  CO2 28 31  GLUCOSE 161* 205*  BUN 67* 39*  CREATININE 5.31* 3.46*  CALCIUM 9.1 9.1  PHOS 2.5 1.6*   CBC Recent Labs  Lab 05/24/21 1417 05/26/21 2048  WBC 7.7 5.8  HGB 8.3* 8.8*  HCT 27.7* 28.2*  MCV 104.5* 104.1*  PLT 178 169    Medications:     aspirin EC  81 mg Oral Daily   atorvastatin  80 mg Oral Daily   calcitRIOL  0.25 mcg Oral BID   Chlorhexidine Gluconate Cloth  6 each Topical Q0600   Chlorhexidine Gluconate Cloth  6 each Topical Q0600   cinacalcet  30 mg Oral Q Mon   clopidogrel  75 mg Oral Daily   darbepoetin ([name]) injection - DIALYSIS  150 mcg Intravenous Q Wed-HD   feeding supplement (NEPRO CARB STEADY)  237 mL Oral QHS   furosemide  80 mg Oral BID   gabapentin  200 mg Oral QHS   gentamicin cream   Topical Daily   heparin  5,000 Units Subcutaneous Q8H   insulin aspart  0-6 Units Subcutaneous TID WC   insulin glargine  7 Units Subcutaneous QHS   linaclotide  145 mcg Oral QAC breakfast   midodrine  10 mg Oral Q M,W,F-HD   multivitamin  1 tablet Oral QHS   nutrition supplement (JUVEN)  1 packet Oral BID BM   pantoprazole  40 mg Oral Daily   pentoxifylline  400 mg Oral Q breakfast   senna-docusate  1 tablet Oral BID   sorbitol  30 mL Oral BID   tamsulosin  0.8 mg Oral QHS   zolpidem  10 mg Oral QHS

## 2021-05-29 LAB — CBC
HCT: 27.3 % — ABNORMAL LOW (ref 39.0–52.0)
Hemoglobin: 8.6 g/dL — ABNORMAL LOW (ref 13.0–17.0)
MCH: 32.8 pg (ref 26.0–34.0)
MCHC: 31.5 g/dL (ref 30.0–36.0)
MCV: 104.2 fL — ABNORMAL HIGH (ref 80.0–100.0)
Platelets: 172 10*3/uL (ref 150–400)
RBC: 2.62 MIL/uL — ABNORMAL LOW (ref 4.22–5.81)
RDW: 20.4 % — ABNORMAL HIGH (ref 11.5–15.5)
WBC: 5.8 10*3/uL (ref 4.0–10.5)
nRBC: 0 % (ref 0.0–0.2)

## 2021-05-29 LAB — RENAL FUNCTION PANEL
Albumin: 2.4 g/dL — ABNORMAL LOW (ref 3.5–5.0)
Anion gap: 11 (ref 5–15)
BUN: 84 mg/dL — ABNORMAL HIGH (ref 8–23)
CO2: 25 mmol/L (ref 22–32)
Calcium: 9.5 mg/dL (ref 8.9–10.3)
Chloride: 93 mmol/L — ABNORMAL LOW (ref 98–111)
Creatinine, Ser: 6.08 mg/dL — ABNORMAL HIGH (ref 0.61–1.24)
GFR, Estimated: 10 mL/min — ABNORMAL LOW (ref >60)
Glucose, Bld: 172 mg/dL — ABNORMAL HIGH (ref 70–99)
Phosphorus: 3.3 mg/dL (ref 2.5–4.6)
Potassium: 4.7 mmol/L (ref 3.5–5.1)
Sodium: 129 mmol/L — ABNORMAL LOW (ref 135–145)

## 2021-05-29 LAB — GLUCOSE, CAPILLARY
Glucose-Capillary: 147 mg/dL — ABNORMAL HIGH (ref 70–99)
Glucose-Capillary: 147 mg/dL — ABNORMAL HIGH (ref 70–99)
Glucose-Capillary: 109 mg/dL — ABNORMAL HIGH (ref 70–99)

## 2021-05-29 MED ORDER — ALBUMIN HUMAN 25 % IV SOLN
INTRAVENOUS | Status: AC
  Administered 2021-05-29: 25 g
  Filled 2021-05-29: qty 100

## 2021-05-29 NOTE — Progress Notes (Signed)
Physical Therapy Session Note  Patient Details  Name: Maxwell Harmon MRN: [id number] Date of Birth: November 27, 1955  Today's Date: 05/29/2021 PT Individual Time: 1105-1205 PT Individual Time Calculation (min): 60 min   Short Term Goals: Week 1:  PT Short Term Goal 1 (Week 1): Pt will initiate gait training with LRAD PT Short Term Goal 1 - Progress (Week 1): Not met PT Short Term Goal 2 (Week 1): Pt will perform lateral transfer with CGA PT Short Term Goal 2 - Progress (Week 1): Met PT Short Term Goal 3 (Week 1): Pt will be mod I with w/c propulsion up to 150 ft PT Short Term Goal 3 - Progress (Week 1): Progressing toward goal Week 2:  PT Short Term Goal 1 (Week 2): =LTG due to ELOS  Skilled Therapeutic Interventions/Progress Updates:  Pain:  Pt reports no pain.  Treatment to tolerance.  Rest breaks and repositioning as needed.  Pt initially oob in wc and agreeable to treatment session w/focus on standing endurance, pregait. Wc propulsion room to gym mod I w/additional time. Squat pivot wc to/from mat w/cga and set up. Sit to stand from elevated mat w/cga, cues for mechanics, one failed effort due to inadequate wt shift, w/static standing up to 1 min x 3 w/cga w/RW, cues for upright posture/gaze. Pt instructed w/press and sway technique for unweighting RLE in standing, iportance of controlled advancement/ground reaction forces w/RLE to prevent injury to intact LE. In parallel bars - pt Sit to stand from wc w/cga using bars, able to [facility] RLE after several attempts then proceeded to perform 2 very small steps on first effort, 3 increased steps w/second effort, 3 small steps third effort w/Therapist very closely guarding R knee and cuing pt for sequencing for use of triceps and shoulder depressors as well as mechanics. Wc propulsion to room mod I, backing/chair set up for transfer min assist. Squat pivot wc to bed w/min assist, cues.  Sit to supine w/cues only.  Pt left supine w/rails up x 4,  alarm set, bed in lowest position, and needs in reach. Therapy Documentation Precautions:  Precautions Precautions: Fall Precaution Comments: charcot foot, wound VAC, LLE NWB Required Braces or Orthoses: Other Brace Other Brace: Limb Guard Restrictions Weight Bearing Restrictions: Yes LLE Weight Bearing: Non weight bearing Other Position/Activity Restrictions: limb protector for mobility     Therapy/Group: Individual Therapy Callie Fielding, [place] 05/29/2021, 12:30 PM

## 2021-05-29 NOTE — Progress Notes (Signed)
PROGRESS NOTE   Subjective/Complaints:  Pt and wife scared that pt not ready for d/c- explained last week (pt might not remember) that would discuss moving d/c date Tuesday.   Slept "fair"- without Seroquel.  Sore on buttocks- found to have new pressure ulcers in inner buttocks per pt/nurse. Can't get comfortable.  Also concerned about a blister on L BKA. After assessing, it's the blister he came with and looks better.    ROS:  Pt denies SOB, abd pain, CP, N/V/C/D, and vision changes    Objective:   No results found. Recent Labs    05/26/21 2048 05/29/21 1445  WBC 5.8 5.8  HGB 8.8* 8.6*  HCT 28.2* 27.3*  PLT 169 172    Recent Labs    05/26/21 2048 05/29/21 1445  NA 133* 129*  K 4.2 4.7  CL 95* 93*  CO2 31 25  GLUCOSE 205* 172*  BUN 39* 84*  CREATININE 3.46* 6.08*  CALCIUM 9.1 9.5     Intake/Output Summary (Last 24 hours) at 05/29/2021 2000 Last data filed at 05/29/2021 1902 Gross per 24 hour  Intake 520 ml  Output 4000 ml  Net -3480 ml        Physical Exam: Vital Signs Blood pressure 106/64, pulse 80, temperature 98 F (36.7 C), temperature source Oral, resp. rate 18, height 5\' 11"  (1.803 m), weight 89.5 kg, SpO2 97 %.    General: awake, alert, appropriate, sitting up in bed; needed assistance to turn in bed;  NAD HENT: conjugate gaze; oropharynx moist CV: regular rate; no JVD Pulmonary: CTA B/L; no W/R/R- good air movement GI: soft, NT, ND, (+)BS Psychiatric: appropriate; still slightly slowed responses, but appropriate Neurological: Alert- more than last week.   Musculoskeletal:    Cervical back: Normal range of motion and neck supple.    Comments: Right heel boggy and red. R foot- Charcot food- has no arch- no actual skin broken down- scabs R calf scattered  Skin has foam on it on back of L posterior thigh Also assessed L BKA today- blister that already had is actually  deflated in the middle of L BKA- some localized erythema, likely due to sutures and staples- Also has B/L stage II inner buttocks pressure ulcers- which are new- about size of quarter or slightly bigger- abraded and some serous drainage noted.   L BKA is shaping great- has staples intact- some dried blood noted- shrinker sticking to staples, however no active drainage seen- a little dried serous yellow spots, but no erythema- mild edema.  UE 5/5 B/L in biceps, triceps, WE< grip and FA Les- RLE- HF 4+/5, KF/KE 4/5, DF and PF 4+/5 LLE- HF and KE at least 3+/5- no pain with movement today  Shaping OK- has shrinker on with limb guard over it- C/D/I Skin: L BKA as above.     Comments: IV R forearm LUE fistula-  Scattered scabs on R calf/shin Fallen arch on R foot/charcot foot R heel boggy - slightly less Neurological:    Mental Status: He is alert and oriented to person, place, and time.    Comments: Decreased to absent Sensation to light touch from  R knee downwards  RUE twitching occ.    Assessment/Plan: 1. Functional deficits which require 3+ hours per day of interdisciplinary therapy in a comprehensive inpatient rehab setting. Physiatrist is providing close team supervision and 24 hour management of active medical problems listed below. Physiatrist and rehab team continue to assess barriers to discharge/monitor patient progress toward functional and medical goals  Care Tool:  Bathing    Body parts bathed by patient: Right arm, Chest, Left arm, Abdomen, Front perineal area, Face   Body parts bathed by helper: Right lower leg Body parts n/a: Left lower leg   Bathing assist Assist Level: Moderate Assistance - Patient 50 - 74%     Upper Body Dressing/Undressing Upper body dressing   What is the patient wearing?: Pull over shirt    Upper body assist Assist Level: Set up assist    Lower Body Dressing/Undressing Lower body dressing      What is the patient wearing?: Pants      Lower body assist Assist for lower body dressing: Maximal Assistance - Patient 25 - 49%     Toileting Toileting    Toileting assist Assist for toileting: Maximal Assistance - Patient 25 - 49%     Transfers Chair/bed transfer  Transfers assist     Chair/bed transfer assist level: Supervision/Verbal cueing (SB transfer) Chair/bed transfer assistive device: Sliding board   Locomotion Ambulation   Ambulation assist   Ambulation activity did not occur: Safety/medical concerns  Assist level: Minimal Assistance - Patient > 75% Assistive device: Parallel bars Max distance: 18ft   Walk 10 feet activity   Assist  Walk 10 feet activity did not occur: Safety/medical concerns        Walk 50 feet activity   Assist Walk 50 feet with 2 turns activity did not occur: Safety/medical concerns         Walk 150 feet activity   Assist Walk 150 feet activity did not occur: Safety/medical concerns         Walk 10 feet on uneven surface  activity   Assist Walk 10 feet on uneven surfaces activity did not occur: Safety/medical concerns         Wheelchair     Assist Will patient use wheelchair at discharge?: Yes Type of Wheelchair: Manual    Wheelchair assist level: Supervision/Verbal cueing Max wheelchair distance: 100'    Wheelchair 50 feet with 2 turns activity    Assist        Assist Level: Supervision/Verbal cueing   Wheelchair 150 feet activity     Assist      Assist Level: Maximal Assistance - Patient 25 - 49%   Blood pressure 106/64, pulse 80, temperature 98 F (36.7 C), temperature source Oral, resp. rate 18, height 5\' 11"  (1.803 m), weight 89.5 kg, SpO2 97 %.  Medical Problem List and Plan: 1.  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  Con't  PT, OT/[name]-  2.  Antithrombotics: -DVT/anticoagulation:  Pharmaceutical: Heparin             -antiplatelet therapy: ASA/Plavix 3. Pain Management: Change dilaudid to oxycodone prn.             --  resume gabapentin to help manage phantom pain.  7/16- reduce Oxy to 2.5 to 5 mg q4 hours prn for pain and ocn't tramadol as well  7/17- pain still an issue- nerve pain and residual limb- on max dose of Gabapentin- will ask therapy to do mirror therapy.  7/18 no pain but very groggy and having tremors , reduce gabapentin to 400mg    7/20- pain doing OK even with reduction in gabapentin to 200 mg QHS- con't regimen 7/21 4. Mood: LCSW to follow for evaluation and support.               -antipsychotic agents: N/A 5. Neuropsych: This patient is capable of making decisions on his own behalf. 6. Skin/Wound Care: Continue wound VAC             --On Zinc and Juven to help promote wound healing.  7/16- remove VAC- is due today-   7/17- has 1 blister, but looks OK- will monitor  7/21- skin looks better- dried blood- no erythema seen- con't shrinker- shaping w  7/22- had foam added for rubbing raw on back of posterior L thigh- will monitor  7/25- Has new B/L inner buttocks wounds/pressure ulcers- uncomfortable- asked nursing to turn him to get off backside.  7. Fluids/Electrolytes/Nutrition: Strict I/O with daily weights. 8. R -charcot foot: Encourage wearing CAM boot with activity. 9. T2DM: Hgb A1C-8.2. Monitor BS ac/hs             --continue Lantus 5 units with SSI for elevated BS  CBG (last 3)  Recent Labs    05/28/21 2109 05/29/21 0608 05/29/21 1208  GLUCAP 196* 147* 147*   7/25- BG's controlled with 1 epside of night time elevation.  10. ESRD: HD MWF at the end of the day to facilitate therapy and endurance issues             --No plans to resume PD. Midodrine for BP support in hemo.  7/20- K+ 5.2 today- per renal- also got approval from renal to increase Flomax 7/21- Per renal for K+ 5.2.   Managed by nephro 7/25- Na 129- per renal 11. CAD w/recent NSTEMI: Treated medically w/ASA, Plavix and Lipitor.   12. Acute on chronic anemia: Loaded with IV ferric gluconate --continue [name] 150 mc weekly with serial CBC to monitor for stability . 13. PAD: On trental, plavix and ASA. 14. Chronic constipation: Continue Sorbitol bid.  7/16- LBM Thursday- gave an addiitonal dose of Sorbitol since cannot give Mg citrate  7/17- LBM overnight- was hard- will con't Sorbitol BID- might need more as time goes on.   7/19- will add Linzess 145 mcg daily- starting tomorrow  7/20- will add Senokot 1 tab BID and con't miralax BID and Sorbitol BID for now- goal to d/c Sorbitol- con't Linzess- just started this AM  7/21- Had good BM, however still hard- change senokot to senokot-S for stool softener benefits.  15. Hyperparathyroidism: Being managed with aruxia and Calcitriol.  16. Insomnia/RLS: Resume gabapentin for phantom pain as well as RLS.             --Klonopin prn for sleep  7/17- cannot use trazodone for sleep- causes urinary retention/makes it worse- will d/c prn trazodone- con't klonopin and add Melatonin- pt said not real helpful, but maybe with klonpin will help. .   7/19- slept poorly- will try Ambien 5 mg QHS- but no higher. Will monitor  7/20- much improved with Ambien- con't regimen  7/21- will increase Ambien to 10  mg QHS- Ok'd by pharmacy- and add Seroquel 25 mg QHS PRN since "mind racing"- cannot do Elavil?Trazodone due ot urinary retention.   7/22- slept much better with Ambien- 10 mg QHS- no klonopin needed and didn't take seroquel- has seroquel prn.  17 H/o  Depression/anxiety: Continue Lexapro (started a few weeks ago) and klonopin.  7/16- will decrease Klonopin to 0.25 mg BID prn since too sleepy/dazed this AM  7/17- d/c lexapro per pt request.  Did not sleep at all last noc after ambien 5mg  will increase to 10mg  - slept much better 7/24 18. Cough 7/16- will check CXR-  shows  likely atelectasis since WBC is normal and afebrile.      19. Restart meds for hyperparathyroidism-  18. Oliguria/Urinary retention  7/17- required cath for 500cc overnight; will ask to bladder scan q8 hours- usually voided 1-2x/day per pt. Don't want him getting UTI. 7/20- will increase Flomax to 0.8 mg nightly- still requiring caths.  7/21- no change yet- con't regimen     7/22- will change cath to qday at 8pm daily.  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 21. B/L Stage II inner buttock pressure ulcers  7/25- will con't local wound care- and monitor- turn to get off backside when in bed- only sit up when eating.   LOS: 11 days A FACE TO FACE EVALUATION WAS PERFORMED  Izear Pine 05/29/2021, 8:00 PM

## 2021-05-29 NOTE — Progress Notes (Signed)
[facility] Progress Note     Assessment/ Plan:   ESRD: has been on HD due to issues with CCPD: inadequate drainage/UF (catheter malfunction?) Outpatient orders: [facility], MWF, F180, 3hrs, 2k, 2.5cal, 137 na, 35bicarb, 400bfr, 16g, edw 86.5kg. hep 2k bolus - no plans for further PD and is being scheduled to have PD cath removed as an outpatient, therefore no PD cath flushing needed.  Dressing to be changed daily - RN will facilitate; -HD MWF, next HD today  -7/13 AVF infiltration thought to be due to arm moved - no further issues. 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 [name]. [name]   Volume/ hypertension: EDW 86.5kg pre amputation. Over EDW and with possible pulm edema - push UF as able but also hypotensive-  profile-  low temp and midodrine pre HD.   Makes urine-  dosing with lasix to help to get to EDW -  now some issues with urinary retention-   placed on flomax but still req in and out caths   Anemia of Chronic Kidney Disease: Hemoglobin 8.0. No active infection so gave 0.5g IV iron load over 4 HD treatments. On aranesp here. Transfuse prn-  getting labs only on HD   Secondary Hyperparathyroidism/Hyperphosphatemia: stopped auryxia dose with phos running on low side-   -  also on calcitriol, sensipar- getting labs only with HD   DM2 w/ hyperglycemia -mgmt per primary service   Hypoalbuminemia, protein calorie malnutrition -push protein    Subjective:   Denies f/c/n/v/dyspnea.   Objective:   BP 116/62 (BP Location: Right Arm)   Pulse 88   Temp 98.2 F (36.8 C) (Oral)   Resp 18   Ht 5\' 11"  (1.803 m)   Wt 90.5 kg   SpO2 95%   BMI 27.83 kg/m   Intake/Output Summary (Last 24 hours) at 05/29/2021 1017 Last data filed at 05/28/2021 2103 Gross per 24 hour  Intake 600 ml  Output 400 ml  Net 200 ml   Weight change:    Physical Exam: Gen:nad, nontoxic appearing CVS:rrr Resp:normal wob on RA, a few basilar rales PZW:CHENI RLE 1+ edema, LLE amputated with immobilizer on Neuro: alert and cooperative Access: lt BCF  +b/t  Imaging: No results found.  Labs: BMET Recent Labs  Lab 05/24/21 1417 05/26/21 2048  NA 131* 133*  K 5.2* 4.2  CL 96* 95*  CO2 28 31  GLUCOSE 161* 205*  BUN 67* 39*  CREATININE 5.31* 3.46*  CALCIUM 9.1 9.1  PHOS 2.5 1.6*   CBC Recent Labs  Lab 05/24/21 1417 05/26/21 2048  WBC 7.7 5.8  HGB 8.3* 8.8*  HCT 27.7* 28.2*  MCV 104.5* 104.1*  PLT 178 169    Medications:     aspirin EC  81 mg Oral Daily   atorvastatin  80 mg Oral Daily   calcitRIOL  0.25 mcg Oral BID   Chlorhexidine Gluconate Cloth  6 each Topical Q0600   Chlorhexidine Gluconate Cloth  6 each Topical Q0600   Chlorhexidine Gluconate Cloth  6 each Topical Q0600   cinacalcet  30 mg Oral Q Mon   clopidogrel  75 mg Oral Daily   darbepoetin ([name]) injection - DIALYSIS  150 mcg Intravenous Q Wed-HD   feeding supplement (NEPRO CARB STEADY)  237 mL Oral QHS   furosemide  80 mg Oral BID   gabapentin  200 mg Oral QHS   gentamicin cream  Topical Daily   heparin  5,000 Units Subcutaneous Q8H   insulin aspart  0-6 Units Subcutaneous TID WC   insulin glargine  7 Units Subcutaneous QHS   linaclotide  145 mcg Oral QAC breakfast   midodrine  10 mg Oral Q M,W,F-HD   multivitamin  1 tablet Oral QHS   nutrition supplement (JUVEN)  1 packet Oral BID BM   pantoprazole  40 mg Oral Daily   pentoxifylline  400 mg Oral Q breakfast   senna-docusate  1 tablet Oral BID   sorbitol  30 mL Oral BID   tamsulosin  0.8 mg Oral QHS   zolpidem  10 mg Oral QHS      Otelia Santee, MD 05/29/2021, 8:07 AM

## 2021-05-29 NOTE — Progress Notes (Signed)
Occupational Therapy Session Note  Patient Details  Name: Maxwell Harmon MRN: [id number] Date of Birth: 10-13-1956  Today's Date: 05/29/2021 OT Individual Time: 4034-7425 OT Individual Time Calculation (min): 59 min    Short Term Goals: Week 2:  OT Short Term Goal 1 (Week 2): Pt will complete LB dressing with min A and AE prn. OT Short Term Goal 2 (Week 2): Pt will complete functional transfers to all appropriate surfaces with CGA and LRAD. OT Short Term Goal 3 (Week 2): Pt will complete 3/3 aspects of toileting with min A. OT Short Term Goal 4 (Week 2): Pt will groom standing sink side with CGA with no more than one rest break  for improved standing tolerance for ADLs.  Skilled Therapeutic Interventions/Progress Updates:  Pt received seated in w/c agreeable to OT intervention. Session focus on standing tolerance, transfer training,  dynamic standing balance, and BUE therex. Pt completed w/c propulsion to gym with supervision where pt completed SB transfer to EOM with gross supervision. Pt completed x3 sit<>stands from EOM with RW with MIN A ( pt prefers to pull up on RW), pt able to complete mini squats in standing x10 reps each standing trial. Pt additionally completed seated BUE therex with 5lb dowel rod including x15 reps of bicep curls, chest press and rows. Pt additionally able to tap ball back and forth to OTA for 30 secs x3 trials with 1 min rest break in between each trial to faciliated improved activity tolerance and BUE strength and endurance. Pt completed x15 reps of modified tricep dips and modified crunches on therapy ball.  Added in functional reaching during standing trials with pt able to stack cones( to simulate ADLS at sink), p/u bean bags and toss towards back side ( to simulate posterior pericare) and place clothespins on line ( to simulate reaching in cabinet) with overall MIN A, pt with good body awareness during task able to report when pt feeling off balance, cues needed for  knee extension during standing trials. Pt completed SB transfer back to w/c in similar fashion as previously indicated, pt completed w/c propulsion back to room with supervision where pt was left up in w/c with all needs within reach and safety belt activated.   Therapy Documentation Precautions:  Precautions Precautions: Fall Precaution Comments: charcot foot, wound VAC, LLE NWB Required Braces or Orthoses: Other Brace Other Brace: Limb Guard Restrictions Weight Bearing Restrictions: Yes LLE Weight Bearing: Non weight bearing Other Position/Activity Restrictions: limb protector for mobility General:   Vital Signs:   Pain: Pt reports no pain during session.    Therapy/Group: Individual Therapy  Precious Haws 05/29/2021, 12:17 PM

## 2021-05-29 NOTE — Plan of Care (Signed)
  Problem: RH Ambulation Goal: LTG Patient will ambulate in controlled environment (PT) Description: LTG: Patient will ambulate in a controlled environment, # of feet with assistance (PT). Flowsheets (Taken 05/29/2021 0756) LTG: Pt will ambulate in controlled environ  assist needed:: (d/c goal due to slow progress) -- Note: D/c goal due to slow progress Goal: LTG Patient will ambulate in home environment (PT) Description: LTG: Patient will ambulate in home environment, # of feet with assistance (PT). Flowsheets (Taken 05/29/2021 0756) LTG: Pt will ambulate in home environ  assist needed:: (d/c goal due to slow progress) -- Note: D/c goal due to slow progress

## 2021-05-29 NOTE — Progress Notes (Addendum)
Patient ID: Maxwell Harmon, male   DOB: 08-Dec-1955, 65 y.o.   MRN: [id number]  SW received updates from OT pt preferred [facility] is [facility]. SW faxed HHPT/OT order to [facility] (Y:[phone]/T:[phone]).  *SW confirmed with Amy referral was received. SW waiting on updates if able to accept.   SW f/u with pt wife Maxwell Harmon to discuss DME recommendations, and above about HHA.   SW ordered DME: w/c and slide board.   *SW received phone call from pt wife Maxwell Harmon who reported that they believe pt needs to be here longer since he is still really weak, and since it is just the two of them, they believe more time is needed. SW informed will share concerns with medical team, and will f/u after team conference tomorrow.   Loralee Pacas, MSW, [place] Office: [phone] Cell: [phone] Fax: [phone]

## 2021-05-29 NOTE — Progress Notes (Signed)
Patient slept well this shift. Tylenol 650 mg given for surgical site to left leg-effective.

## 2021-05-29 NOTE — Progress Notes (Signed)
Physical Therapy Weekly Progress Note  Patient Details  Name: Maxwell Harmon MRN: [id number] Date of Birth: August 07, 1956  Beginning of progress report period: May 19, 2021 End of progress report period: May 29, 2021  Today's Date: 05/29/2021 PT Individual Time: 0800-0900 PT Individual Time Calculation (min): 60 min   Patient has met 1 of 3 short term goals.  Patient continues to make very slow progress towards d/c goals. He is currently Supervision for bed mobility with hospital bed features, will work towards becoming more independent without use of hospital bed features. Pt is CGA for SB transfers and min to mod A for squat pivot transfers, min A to stand to RW from elevated surfaces. Pt is currently unable to perform standing transfers or gait with RW due to ongoing LE and UE weakness. Pt is able to perform w/c mobility at Supervision level up to 100 ft with use of BUE due to decreased endurance.  Patient continues to demonstrate the following deficits muscle weakness and muscle joint tightness, decreased cardiorespiratoy endurance, decreased problem solving, decreased safety awareness, decreased memory, and delayed processing, and decreased sitting balance, decreased standing balance, decreased postural control, and decreased balance strategies and therefore will continue to benefit from skilled PT intervention to increase functional independence with mobility.  Patient not progressing toward long term goals.  See goal revision..  Plan of care revisions: discharged gait goals due to slow progress with standing and standing balance, gait unsafe to attempt at this time. [name] is on target to meet all other rehab goals.  PT Short Term Goals Week 1:  PT Short Term Goal 1 (Week 1): Pt will initiate gait training with LRAD PT Short Term Goal 1 - Progress (Week 1): Not met PT Short Term Goal 2 (Week 1): Pt will perform lateral transfer with CGA PT Short Term Goal 2 - Progress (Week 1): Met PT  Short Term Goal 3 (Week 1): Pt will be mod I with w/c propulsion up to 150 ft PT Short Term Goal 3 - Progress (Week 1): Progressing toward goal Week 2:  PT Short Term Goal 1 (Week 2): =LTG due to ELOS  Skilled Therapeutic Interventions/Progress Updates:  Pt received seated in bed, agreeable to PT session. No complaints of pain. Pt unsure of location of limb shrinkers and nurse had just redressed residual limb with curlex. Assisted pt with wrapping limb with ACE wrap for compression. Assisted pt with donning residual limb guard. Pt noted to have extensive bleeding from LUE, nursing notified. Nursing able to dress site of bleeding from LUE. Supine to sit with Supervision with HOB elevated and use of bedrail. Squat pivot transfer to the R bed to w/c with mod A. Manual w/c propulsion 2 x 100 ft with use of BUE at Supervision level. Pt able to remove R leg rest with cues, assisted with L residual limb holder. Squat pivot transfer w/c to/from mat table x 4 reps with min A, cues for head/hips relationship and weight shift with transfer. Sit to stand x 4 reps from elevated mat table to RW with min A, pt utilizes BUE on RW rather than being able to push up with one UE from mat table to stand. Once in standing pt requires cues for knee, hip, and trunk extension for upright posture. Pt initially with posterior lean and LOB, as reps of standing progress pt exhibits improved standing balance. Pt does continue to exhibit heavy UE reliance in standing and increase in R knee flexion with onset  of fatigue. Discussed with patient that he will likely d/c home at w/c level due to his continued difficulties with standing and inability to [facility] RLE in standing so gait is unlikely by d/c. Pt left seated in w/c in room with needs in reach, quick release belt and chair alarm in place at end of session.  Therapy Documentation Precautions:  Precautions Precautions: Fall Precaution Comments: charcot foot, wound VAC, LLE  NWB Required Braces or Orthoses: Other Brace Other Brace: Limb Guard Restrictions Weight Bearing Restrictions: Yes LLE Weight Bearing: Non weight bearing Other Position/Activity Restrictions: limb protector for mobility   Therapy/Group: Individual Therapy   Excell Seltzer, PT, DPT, CSRS 05/29/2021, 9:54 AM

## 2021-05-30 LAB — GLUCOSE, CAPILLARY
Glucose-Capillary: 91 mg/dL (ref 70–99)
Glucose-Capillary: 99 mg/dL (ref 70–99)
Glucose-Capillary: 140 mg/dL — ABNORMAL HIGH (ref 70–99)
Glucose-Capillary: 171 mg/dL — ABNORMAL HIGH (ref 70–99)

## 2021-05-30 MED ORDER — ACETAMINOPHEN 325 MG PO TABS: 325.0000 mg | ORAL_TABLET | ORAL | Status: DC | PRN

## 2021-05-30 MED ORDER — METAXALONE 800 MG PO TABS
400.0000 mg | ORAL_TABLET | Freq: Three times a day (TID) | ORAL | Status: DC | PRN
  Administered 2021-06-03 (×2): 400 mg via ORAL
  Filled 2021-05-30: qty 1
  Filled 2021-05-30: qty 0.5
  Filled 2021-05-30: qty 1
  Filled 2021-05-30 (×2): qty 0.5

## 2021-05-30 MED ORDER — QUETIAPINE FUMARATE 25 MG PO TABS
25.0000 mg | ORAL_TABLET | Freq: Every day | ORAL | Status: DC
  Administered 2021-05-30: 25 mg via ORAL
  Filled 2021-05-30: qty 1

## 2021-05-30 MED ORDER — CHLORHEXIDINE GLUCONATE CLOTH 2 % EX PADS
6.0000 | MEDICATED_PAD | Freq: Every day | CUTANEOUS | Status: DC
  Administered 2021-05-30 – 2021-06-07 (×9): 6 via TOPICAL

## 2021-05-30 NOTE — Progress Notes (Signed)
Patient was seen by nephrologist this morning and afterwards therapy alerted this nurse to bleeding from the patient's left arm. Dressing placed, charge nurse and MD notified. Angie Fava

## 2021-05-30 NOTE — Progress Notes (Signed)
Slept for 7 hours intermittently throughout the night. Awakening several times with c/o pain to left stump and then most recently right hip. Pt noted positioned on right side when reported. Pt turned and repositioned throughout the night. Pt then repositioned on left side in which pt reports no relief. Pt given tylenol, oxycodone 2.5 mg and then 5 mg later this morning. PRN robaxin also given, pt resting quietly upon reassessment. However with followup this am, pt states "it only put me to sleep and helped a little, it still hurts, I don't know what happened to my hip".

## 2021-05-30 NOTE — Discharge Instructions (Addendum)
Inpatient Rehab Discharge Instructions  Maxwell Harmon Discharge date and time:  06/08/21  Activities/Precautions/ Functional Status: Activity: no lifting, driving, or strenuous exercise for cleared by MD Diet: renal diet Limit fluids to 5 cups/day -(1200 cc per day) Wound Care: Wash with antibacterial soap and water. Pat dry and apply shrinker daily   Functional status:  ___ No restrictions     ___ Walk up steps independently _X__ 24/7 supervision/assistance   ___ Walk up steps with assistance ___ Intermittent supervision/assistance  ___ Bathe/dress independently ___ Walk with walker     __X_ Bathe/dress with assistance ___ Walk Independently    ___ Shower independently ___ Walk with assistance    ___ Shower with assistance _X__ No alcohol     ___ Return to work/school ________  COMMUNITY REFERRALS UPON DISCHARGE:    Home Health:   PT     OT           SN                Agency: [facility]    Phone: [phone] *Please expect follow-up within 2-3 days of discharge to schedule your home visit. If you have not received follow-up, be sure to contact the branch directly.*     Medical Equipment/Items Ordered: left amputee pad and 30" slide board, drop arm bedside commode                                                 Agency/Supplier: [facility] [phone]   Medical Equipment/Items Ordered:  catheters                                                 Agency/Supplier: [employer] [phone]; [place] [phone]  Special Instructions: Continue to self cath once a day after supper if you have not voided during the day.    My questions have been answered and I understand these instructions. I will adhere to these goals and the provided educational materials after my discharge from the hospital.  Patient/Caregiver Signature _______________________________ Date __________  Clinician Signature _______________________________________ Date  __________  Please bring this form and your medication list with you to all your follow-up doctor's appointments.

## 2021-05-30 NOTE — Progress Notes (Signed)
Physical Therapy Session Note  Patient Details  Name: Maxwell Harmon MRN: [id number] Date of Birth: Aug 13, 1956  Today's Date: 05/30/2021 PT Individual Time: 0900-1000; 1400-1500 PT Individual Time Calculation (min): 60 min and 60 min  Short Term Goals: Week 2:  PT Short Term Goal 1 (Week 2): =LTG due to ELOS  Skilled Therapeutic Interventions/Progress Updates:    Session 1: Pt received seated in bed, agreeable to PT session. Pt reports 3/10 pain L hip at rest that improves with mobility, premedicated prior to start of therapy session. Assisted patient with donning L residual limb shrinker and limb guard. Contacted [employer] to obtain 2nd limb shrinker for patient, Nicki Reaper to bring patient a 2nd shrinker. Supine to sit with Supervision with HOB elevated and use of bedrail. Squat pivot transfer bed to w/c with min A. Pt reports his bed at home and the car he will d/c home in [facility]) are both 25" tall. Squat pivot transfer w/c to/from 25" tall mat table with min A going both directions. Pt continues to exhibit improved ability to sequence and perform transfers. Provided patient with Roho cushion due to onset of skin breakdown on buttocks. Reviewed pressure relief technique of w/c pushups. Pt able to fully clear buttocks with w/c pushup, instructed to perform every 30 min while seated in w/c. Also discussed turning schedule in bed and that patient should change positions every 2 hours in bed and spend time in sidelying instead of in supine. Pt understanding of education. Pt left seated in w/c in room with needs in reach, quick release belt and chair alarm in place at end of session.  Session 2: Pt received seated in w/c in room, agreeable to PT session. No complaints of pain. Updated patient on extended LOS and new d/c date. Manual w/c propulsion up to 150 ft with use of BUE at Supervision level this session. Pt exhibits increased independent with management of w/c parts. Provided patient with w/c gloves for  improved grip and hand protection during w/c management. Sit to stand with min to mod A to RW throughout session. Pt exhibits occasional LOB posteriorly while standing with RW requiring mod A to correct. Stand pivot transfer w/c to/from mat table with min to mod A x 4 reps. Pt has onset of LUE bleeding from skin tear wound that was previously wrapped, nursing notified and able to change dressing. Pt reports urge to use the bathroom at end of session. Stand pivot transfer w/c to elevated BSC over toilet with RW and min A. Pt left seated on BSC in bathroom, NT aware of pt's location and call button in reach.  Therapy Documentation Precautions:  Precautions Precautions: Fall Precaution Comments: charcot foot, wound VAC, LLE NWB Required Braces or Orthoses: Other Brace Other Brace: Limb Guard Restrictions Weight Bearing Restrictions: Yes LLE Weight Bearing: Non weight bearing Other Position/Activity Restrictions: limb protector for mobility   Therapy/Group: Individual Therapy   Excell Seltzer, PT, DPT, CSRS  05/30/2021, 12:56 PM

## 2021-05-30 NOTE — Progress Notes (Signed)
[facility] Progress Note   Outpatient orders: [place], MWF, F180, 3hrs, 2k, 2.5cal, 137 na, 35bicarb, 400bfr, 16g, edw 86.5kg. hep 2k bolus  Assessment/ Plan:   ESRD: has been on HD due to issues with CCPD: inadequate drainage/UF (catheter malfunction?)  - no plans for further PD and is being scheduled to have PD cath removed as an outpatient, therefore no PD cath flushing needed.  Dressing to be changed daily - RN will facilitate;  -HD MWF, next HD tomorrow; able to net UF 4L on 7/25  -7/13 AVF infiltration thought to be due to arm moved - no further issues.    Cellulitis/diabetic foot, progressive gangrene Left BKA  7/8. [name]. [name]   Volume/ hypertension: EDW 86.5kg pre amputation. Over EDW and with possible pulm edema - push UF as able but also hypotensive-  profile-  low temp and midodrine pre HD.   Makes urine-  dosing with lasix to help to get to EDW -  now some issues with urinary retention-   placed on flomax but still req in and out caths   Anemia of Chronic Kidney Disease: Hemoglobin 8.0. No active infection so gave 0.5g IV iron load over 4 HD treatments. On aranesp here. Transfuse prn-  getting labs only on HD   Secondary Hyperparathyroidism/Hyperphosphatemia: stopped auryxia dose with phos running on low side (3.3 on 7/25)    -  also on calcitriol, sensipar- getting labs only with HD   DM2 w/ hyperglycemia -mgmt per primary service   Hypoalbuminemia, protein calorie malnutrition -push protein    Subjective:   Denies f/c/n/v/dyspnea. Tolerated HD yest; just a little frustrated with having PT and HD on same day. He voiced issues with starting dialysis at 3PM   Objective:   BP (!) 116/57 (BP Location: Right Arm)   Pulse 78   Temp 98.4 F (36.9 C) (Oral)   Resp 18   Ht 5\' 11"  (1.803 m)   Wt 90.9 kg   SpO2 97%   BMI 27.95 kg/m   Intake/Output Summary (Last 24 hours) at 05/30/2021 2353 Last data filed at 05/30/2021 0725 Gross per 24 hour   Intake 720 ml  Output 4501 ml  Net -3781 ml   Weight change: 2.9 kg  Physical Exam: Gen:nad, nontoxic appearing CVS:rrr Resp:normal wob on RA, a few basilar rales IRW:ERXVQ RLE 1+ edema, LLE amputated with immobilizer on Neuro: alert and cooperative Access: lt BCF  +b/t  Imaging: No results found.  Labs: BMET Recent Labs  Lab 05/24/21 1417 05/26/21 2048 05/29/21 1445  NA 131* 133* 129*  K 5.2* 4.2 4.7  CL 96* 95* 93*  CO2 28 31 25   GLUCOSE 161* 205* 172*  BUN 67* 39* 84*  CREATININE 5.31* 3.46* 6.08*  CALCIUM 9.1 9.1 9.5  PHOS 2.5 1.6* 3.3   CBC Recent Labs  Lab 05/24/21 1417 05/26/21 2048 05/29/21 1445  WBC 7.7 5.8 5.8  HGB 8.3* 8.8* 8.6*  HCT 27.7* 28.2* 27.3*  MCV 104.5* 104.1* 104.2*  PLT 178 169 172    Medications:     aspirin EC  81 mg Oral Daily   atorvastatin  80 mg Oral Daily   calcitRIOL  0.25 mcg Oral BID   Chlorhexidine Gluconate Cloth  6 each Topical Q0600   cinacalcet  30 mg Oral Q Mon   clopidogrel  75 mg Oral Daily   darbepoetin ([name]) injection - DIALYSIS  150 mcg Intravenous Q Wed-HD   feeding supplement (NEPRO CARB STEADY)  237  mL Oral QHS   furosemide  80 mg Oral BID   gabapentin  200 mg Oral QHS   gentamicin cream   Topical Daily   heparin  5,000 Units Subcutaneous Q8H   insulin aspart  0-6 Units Subcutaneous TID WC   insulin glargine  7 Units Subcutaneous QHS   linaclotide  145 mcg Oral QAC breakfast   midodrine  10 mg Oral Q M,W,F-HD   multivitamin  1 tablet Oral QHS   nutrition supplement (JUVEN)  1 packet Oral BID BM   pantoprazole  40 mg Oral Daily   pentoxifylline  400 mg Oral Q breakfast   senna-docusate  1 tablet Oral BID   sorbitol  30 mL Oral BID   tamsulosin  0.8 mg Oral QHS   zolpidem  10 mg Oral QHS      Otelia Santee, MD 05/30/2021, 7:29 AM

## 2021-05-30 NOTE — Progress Notes (Signed)
Occupational Therapy Session Note  Patient Details  Name: Maxwell Harmon MRN: [id number] Date of Birth: 1956/03/16  Today's Date: 05/30/2021 OT Individual Time: 1047-1200 OT Individual Time Calculation (min): 73 min    Short Term Goals: Week 2:  OT Short Term Goal 1 (Week 2): Pt will complete LB dressing with min A and AE prn. OT Short Term Goal 2 (Week 2): Pt will complete functional transfers to all appropriate surfaces with CGA and LRAD. OT Short Term Goal 3 (Week 2): Pt will complete 3/3 aspects of toileting with min A. OT Short Term Goal 4 (Week 2): Pt will groom standing sink side with CGA with no more than one rest break  for improved standing tolerance for ADLs.  Skilled Therapeutic Interventions/Progress Updates:  Pt received seated in w/c agreeable to OT intervention. Session focus on transfer training via stand pivot transfers with RW, pt completed x2 stand pivot transfer from w/c to [facility] over regular toilet in BR with MOD A overall. Pt additionally completed stand pivot transfers from EOM to straight back chair with arm rests in gym with MODA. Worked on stand pivot transfers in apartment to low couch with pt needing MAX A +1 to stand from low couch with pillows underneath pts buttock to increase height of sitting surface. Pt worked on standing balance and tolerance in kitchen with pt able to place items in cabinets with MIN A for balance and MOD A for sit<>stand with RW, pt completed w/c propulsion during all transition and back to room where pt was left up in w/c with alarm belt activated and all needs within reach.   Therapy Documentation Precautions:  Precautions Precautions: Fall Precaution Comments: charcot foot, wound VAC, LLE NWB Required Braces or Orthoses: Other Brace Other Brace: Limb Guard Restrictions Weight Bearing Restrictions: Yes LLE Weight Bearing: Non weight bearing Other Position/Activity Restrictions: limb protector for mobility General:   Vital  Signs:  Pain: Pt reports no pain during session.    Therapy/Group: Individual Therapy  Precious Haws 05/30/2021, 12:35 PM

## 2021-05-30 NOTE — Progress Notes (Addendum)
Patient ID: Maxwell Harmon, male   DOB: June 08, 1956, 65 y.o.   MRN: [id number]  SW met with pt in hallway while with therapy and pt aware of change in d/c date to 8/4. PT states his wife is not aware, and asked if she could be contacted.   37- SW made contact with pt wife Angie [phone]) to provide above updates. SW shared possible need for I/O cathing at discharge pending how well pt is able to void on his own. Family edu scheduled for Thursday (7/28/) 1p-3pm.   SW spoke with Amy/[facility] (p:[phone]/f:[phone]) to confirm pt accepted for [name]/aide. SW informed will f/u if SN is needed for cathing to ensure pt is comfortable doing at home.   Loralee Pacas, MSW, [place] Office: [phone] Cell: [phone] Fax: [phone]

## 2021-05-30 NOTE — Patient Care Conference (Signed)
Inpatient RehabilitationTeam Conference and Plan of Care Update Date: 05/30/2021   Time: 11:01 AM    Patient Name: Maxwell Harmon      Medical Record Number: [id number]  Date of Birth: August 16, 1956 Sex: Male         Room/Bed: 4M12C/4M12C-01 Payor Info: Payor: MEDICARE / Plan: MEDICARE PART A AND B / Product Type: *No Product type* /    Admit Date/Time:  05/18/2021  9:49 PM  Primary Diagnosis:  Below-knee amputation of left lower extremity [facility])  Hospital Problems: Principal Problem:   Below-knee amputation of left lower extremity ([place]) Active Problems:   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[place])   ESRD on hemodialysis ([place])   Acute on chronic systolic (congestive) heart failure [facility])    Expected Discharge Date: Expected Discharge Date: 06/08/21  Team Members Present: Physician leading conference: Dr. Courtney Heys Social Worker Present: Loralee Pacas, [place] Nurse Present: Dorthula Nettles, RN PT Present: Excell Seltzer, PT OT Present: Lillia Corporal, OT PPS Coordinator present : Gunnar Fusi, SLP     Current Status/Progress Goal Weekly Team Focus  Bowel/Bladder   continent of bowel, LBM-7.25.22,  requires cath due to urinary retention, oliguric, hemodialysis pt  resolve urinary retention, regain regular bowel and urinary pattern within limits for patient.  Assess and address B/B every shift and prn.   Swallow/Nutrition/ Hydration             ADL's   based off CARETOOL - LB dress bed level Max A, slide board transfers CGA, toileting Max A  supervision overall  BADL retraining, slide board/functional transfers, UB strength, toileting, global endurance, safety awareness   Mobility   Supervision bed mobility, CGA SB transfers, min to mod A squat pivot transfer, min A to stand to RW, Supervision w/c mobility, able to take steps in // bars 7/25  Supervision overall at w/c level, d/c gait goals due to progress  transfers, sit to stand, w/c  mobility and management, LE strengthening, amputee edu   Communication             Safety/Cognition/ Behavioral Observations            Pain   Reports pain to surgical site on left leg. Rates between 4-7/10 at times, recieves scheduled and prn pain medication.  Pain level <3/10.  Assess and address pain every shift and prn.   Skin   Pt left BKA, with staples intact to incision, blisters noted at site. Stage II areas to intergluteal folds, bruising to abdomen.  Promote healing to areas of skin impairment. No s/sx of infection.  Assess and address skin every shift and prn. Continue with wound care orders.     Discharge Planning:  Pt to d/c to home with his wife who is primary caregiver.Wife works 3 days per week, however, believes that she can work out additional support for someone to be with patient, and/or take time off.   Team Discussion: Left hip pain due to hip extension or muscle cramps. Scheduled sleep medication. Needs hip extension/belly time. Not cathing today. Depressed but refused medications. Continent B/B, leg pain reported, prn medication effective. Skin issues are amputation site, heel with foam dressing, new pressure ulcers to bottom, right little toe, PD catheter site, fistula site. Patient on target to meet rehab goals: Slowly making progress. Stand pivot with RW. Dis take steps in parallel bars. Toileting max assist, lower body max assist at bed level.   *See Care Plan and progress  notes for long and short-term goals.   Revisions to Treatment Plan:  Scheduled Seroquel for sleep, changed Robaxin to Skelaxin for muscle spasms.  Teaching Needs: Family education, medication management, pain management, skin/wound care, bladder management, sleep management, transfer training, gait training, balance training, endurance training, safety awareness.  Current Barriers to Discharge: Decreased caregiver support, Medical stability, Home enviroment access/layout, Neurogenic bowel and  bladder, Wound care, Lack of/limited family support, Weight, Hemodialysis, Weight bearing restrictions, Medication compliance, and Behavior  Possible Resolutions to Barriers: Continue current medications, provide emotional support.     Medical Summary Current Status: B/L iner buttocks Stage II ulcers- new; from sitting in 1 place all the time; Poor sleep; drowsy; in/out caths daily; pain meds help- don't last long enough;  Barriers to Discharge: Other (comments);Wound care;Weight bearing restrictions;Medical stability;Hemodialysis;Home enviroment access/layout;Decreased family/caregiver support   Possible Resolutions to [employer] Focus: Seroquel scheduled for sleep; changed Robaxin to Skelaxin for muscle spasms in hips/pain; don't change Oxy for  pain; per pt request; will try cathing education (try letting him build up bladder volumes first).  now squat pivots; steps in parallel bars; toileting max A; 06/08/21- needs family education as well.   Continued Need for Acute Rehabilitation Level of Care: The patient requires daily medical management by a physician with specialized training in physical medicine and rehabilitation for the following reasons: Direction of a multidisciplinary physical rehabilitation program to maximize functional independence : Yes Medical management of patient stability for increased activity during participation in an intensive rehabilitation regime.: Yes Analysis of laboratory values and/or radiology reports with any subsequent need for medication adjustment and/or medical intervention. : Yes   I attest that I was present, lead the team conference, and concur with the assessment and plan of the team.   Cristi Loron 05/30/2021, 5:17 PM

## 2021-05-30 NOTE — Progress Notes (Signed)
PROGRESS NOTE   Subjective/Complaints:   Doing "pretty good"- but L hip is hurting with ROM.  "Fair" sleep, but keeps forgetting to ask for Seroquel, so not taking.  OK if we tried to schedule for now.   Pain meds are effective, but don't last long enough. Doesn't want to change the dose.   L hip has muscle spasms- and took Robaxin las tnight- not real helpful. Also doesn't want something that could make him sleepy- will try Skelaxin- OK per pharmacy.   Also, still not voiding- pt wants to try without cathing x1 day to see if gets larger volume, he will be able to void.    ROS:  Pt denies SOB, abd pain, CP, N/V/C/D, and vision changes     Objective:   No results found. Recent Labs    05/29/21 1445  WBC 5.8  HGB 8.6*  HCT 27.3*  PLT 172    Recent Labs    05/29/21 1445  NA 129*  K 4.7  CL 93*  CO2 25  GLUCOSE 172*  BUN 84*  CREATININE 6.08*  CALCIUM 9.5     Intake/Output Summary (Last 24 hours) at 05/30/2021 1034 Last data filed at 05/30/2021 0725 Gross per 24 hour  Intake 520 ml  Output 4501 ml  Net -3981 ml        Physical Exam: Vital Signs Blood pressure (!) 116/57, pulse 78, temperature 98.4 F (36.9 C), temperature source Oral, resp. rate 18, height 5\' 11"  (1.803 m), weight 90.9 kg, SpO2 97 %.    General: awake, alert, appropriate, but appears more sad this AM;  NAD HENT: conjugate gaze; oropharynx moist CV: regular rate; no JVD Pulmonary: CTA B/L; no W/R/R- good air movement GI: soft, NT, ND, (+)BS Psychiatric: appropriate; slightly depressed and tired affect Neurological: alert   Musculoskeletal:    Cervical back: Normal range of motion and neck supple.    Comments: Right heel boggy and red. R foot- Charcot food- has no arch- no actual skin broken down- scabs R calf scattered  Skin has foam on it on back of L posterior thigh Also assessed L BKA today- blister that  already had is actually deflated in the middle of L BKA- some localized erythema, likely due to sutures and staples- Also has B/L stage II inner buttocks pressure ulcers- which are new- about size of quarter or slightly bigger- abraded and some serous drainage noted.   L BKA is shaping great- has staples intact- some dried blood noted- shrinker sticking to staples, however no active drainage seen- a little dried serous yellow spots, but no erythema- mild edema.  UE 5/5 B/L in biceps, triceps, WE< grip and FA Les- RLE- HF 4+/5, KF/KE 4/5, DF and PF 4+/5 LLE- HF and KE at least 3+/5- no pain with movement today  Shaping OK- has shrinker on with limb guard over it- C/D/I Skin: L BKA as above.     Comments: IV R forearm LUE fistula-  Scattered scabs on R calf/shin Fallen arch on R foot/charcot foot R heel boggy - slightly less Neurological:    Mental Status: He is alert and oriented to person,  place, and time.    Comments: Decreased to absent Sensation to light touch from R knee downwards  RUE twitching occ.    Assessment/Plan: 1. Functional deficits which require 3+ hours per day of interdisciplinary therapy in a comprehensive inpatient rehab setting. Physiatrist is providing close team supervision and 24 hour management of active medical problems listed below. Physiatrist and rehab team continue to assess barriers to discharge/monitor patient progress toward functional and medical goals  Care Tool:  Bathing    Body parts bathed by patient: Right arm, Chest, Left arm, Abdomen, Front perineal area, Face   Body parts bathed by helper: Right lower leg Body parts n/a: Left lower leg   Bathing assist Assist Level: Moderate Assistance - Patient 50 - 74%     Upper Body Dressing/Undressing Upper body dressing   What is the patient wearing?: Pull over shirt    Upper body assist Assist Level: Set up assist    Lower Body Dressing/Undressing Lower body dressing      What is the  patient wearing?: Pants     Lower body assist Assist for lower body dressing: Maximal Assistance - Patient 25 - 49%     Toileting Toileting    Toileting assist Assist for toileting: Maximal Assistance - Patient 25 - 49%     Transfers Chair/bed transfer  Transfers assist     Chair/bed transfer assist level: Supervision/Verbal cueing (SB transfer) Chair/bed transfer assistive device: Sliding board   Locomotion Ambulation   Ambulation assist   Ambulation activity did not occur: Safety/medical concerns  Assist level: Minimal Assistance - Patient > 75% Assistive device: Parallel bars Max distance: 20ft   Walk 10 feet activity   Assist  Walk 10 feet activity did not occur: Safety/medical concerns        Walk 50 feet activity   Assist Walk 50 feet with 2 turns activity did not occur: Safety/medical concerns         Walk 150 feet activity   Assist Walk 150 feet activity did not occur: Safety/medical concerns         Walk 10 feet on uneven surface  activity   Assist Walk 10 feet on uneven surfaces activity did not occur: Safety/medical concerns         Wheelchair     Assist Will patient use wheelchair at discharge?: Yes Type of Wheelchair: Manual    Wheelchair assist level: Supervision/Verbal cueing Max wheelchair distance: 100'    Wheelchair 50 feet with 2 turns activity    Assist        Assist Level: Supervision/Verbal cueing   Wheelchair 150 feet activity     Assist      Assist Level: Maximal Assistance - Patient 25 - 49%   Blood pressure (!) 116/57, pulse 78, temperature 98.4 F (36.9 C), temperature source Oral, resp. rate 18, height 5\' 11"  (1.803 m), weight 90.9 kg, SpO2 97 %.  Medical Problem List and Plan: 1.  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  Con't [name] PT and OT 2.  Antithrombotics: -DVT/anticoagulation:  Pharmaceutical: Heparin             -  antiplatelet therapy: ASA/Plavix 3. Pain Management: Change dilaudid to oxycodone prn.             --resume gabapentin to help manage phantom pain.  7/16- reduce Oxy to 2.5 to 5 mg q4 hours prn for pain and ocn't tramadol as well  7/17- pain still an issue- nerve pain and residual limb- on max dose of Gabapentin- will ask therapy to do mirror therapy.  7/18 no pain but very groggy and having tremors , reduce gabapentin to 400mg    7/20- pain doing OK even with reduction in gabapentin to 200 mg QHS- con't regimen  7/26- doing better with lower dose gabapentin- will add Skelaxin- for muscle spasms- and d/c robaxin; and have therapy have him do hip extension! 4. Mood: LCSW to follow for evaluation and support.               -antipsychotic agents: N/A 5. Neuropsych: This patient is capable of making decisions on his own behalf. 6. Skin/Wound Care: Continue wound VAC             --On Zinc and Juven to help promote wound healing.  7/16- remove VAC- is due today-   7/17- has 1 blister, but looks OK- will monitor  7/21- skin looks better- dried blood- no erythema seen- con't shrinker- shaping w  7/22- had foam added for rubbing raw on back of posterior L thigh- will monitor  7/25- Has new B/L inner buttocks wounds/pressure ulcers- uncomfortable- asked nursing to turn him to get off backside.  7. Fluids/Electrolytes/Nutrition: Strict I/O with daily weights. 8. R -charcot foot: Encourage wearing CAM boot with activity. 9. T2DM: Hgb A1C-8.2. Monitor BS ac/hs             --continue Lantus 5 units with SSI for elevated BS  CBG (last 3)  Recent Labs    05/29/21 1208 05/29/21 2046 05/30/21 0624  GLUCAP 147* 109* 91   7/25- BG's controlled with 1 epside of night time elevation. 7/26- BG's look great- con't regimen  10. ESRD: HD MWF at the end of the day to facilitate  therapy and endurance issues             --No plans to resume PD. Midodrine for BP support in hemo.  7/20- K+ 5.2 today- per renal- also got approval from renal to increase Flomax 7/21- Per renal for K+ 5.2.  Managed by nephro 7/25- Na 129- per renal 11. CAD w/recent NSTEMI: Treated medically w/ASA, Plavix and Lipitor.   12. Acute on chronic anemia: Loaded with IV ferric gluconate --continue [name] 150 mc weekly with serial CBC to monitor for stability . 13. PAD: On trental, plavix and ASA. 14. Chronic constipation: Continue Sorbitol bid.  7/16- LBM Thursday- gave an addiitonal dose of Sorbitol since cannot give Mg citrate  7/17- LBM overnight- was hard- will con't Sorbitol BID- might need more as time goes on.   7/19- will add Linzess 145 mcg daily- starting tomorrow  7/20- will add Senokot 1 tab BID and con't miralax BID and Sorbitol BID for now- goal to d/c Sorbitol- con't Linzess- just started this AM  7/21- Had good BM, however still hard- change senokot to senokot-S for stool softener benefits.  15. Hyperparathyroidism: Being managed with aruxia and Calcitriol.  16. Insomnia/RLS: Resume gabapentin for phantom pain as well as RLS.             --Klonopin prn for sleep  7/17- cannot use trazodone for sleep- causes urinary retention/makes it  worse- will d/c prn trazodone- con't klonopin and add Melatonin- pt said not real helpful, but maybe with klonpin will help. .   7/19- slept poorly- will try Ambien 5 mg QHS- but no higher. Will monitor  7/20- much improved with Ambien- con't regimen  7/21- will increase Ambien to 10 mg QHS- Ok'd by pharmacy- and add Seroquel 25 mg QHS PRN since "mind racing"- cannot do Elavil?Trazodone due ot urinary retention.   7/22- slept much better with Ambien- 10 mg QHS- no klonopin needed and didn't take seroquel- has seroquel prn.   7/26- will change seroquel to scheduled and see how it goes for sleep.  17 H/o  Depression/anxiety: Continue Lexapro (started a  few weeks ago) and klonopin.  7/16- will decrease Klonopin to 0.25 mg BID prn since too sleepy/dazed this AM  7/17- d/c lexapro per pt request.  Did not sleep at all last noc after ambien 5mg  will increase to 10mg  - slept much better 7/24 18. Cough 7/16- will check CXR-  shows likely atelectasis since WBC is normal and afebrile.      19. Restart meds for hyperparathyroidism-  49. Oliguria/Urinary retention  7/17- required cath for 500cc overnight; will ask to bladder scan q8 hours- usually voided 1-2x/day per pt. Don't want him getting UTI. 7/20- will increase Flomax to 0.8 mg nightly- still requiring caths.  7/21- no change yet- con't regimen     7/22- will change cath to qday at 8pm daily.  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  7/26- will try letting pt not be cathed today and see if increased volume helps him.  If doesn't work- will have pt learn cathing- don't want to place foley.  21. B/L Stage II inner buttock pressure ulcers  7/25- will con't local wound care- and monitor- turn to get off backside when in bed- only sit up when eating.   LOS: 12 days A FACE TO FACE EVALUATION WAS PERFORMED  Hannan Tetzlaff 05/30/2021, 10:34 AM

## 2021-05-31 LAB — GLUCOSE, CAPILLARY
Glucose-Capillary: 106 mg/dL — ABNORMAL HIGH (ref 70–99)
Glucose-Capillary: 110 mg/dL — ABNORMAL HIGH (ref 70–99)
Glucose-Capillary: 115 mg/dL — ABNORMAL HIGH (ref 70–99)
Glucose-Capillary: 161 mg/dL — ABNORMAL HIGH (ref 70–99)

## 2021-05-31 MED ORDER — QUETIAPINE FUMARATE 50 MG PO TABS
50.0000 mg | ORAL_TABLET | Freq: Every day | ORAL | Status: DC
  Administered 2021-05-31: 50 mg via ORAL
  Filled 2021-05-31: qty 1

## 2021-05-31 NOTE — Progress Notes (Signed)
Called hemo to find out about PA cath dressing change because dressing was falling off, this RN was told by hemo nurse that there is no PD nurse available now to change the dressing. This RN changed the dressing. Sterile precaution was followed. We continue to monitor.

## 2021-05-31 NOTE — Progress Notes (Signed)
Physical Therapy Session Note  Patient Details  Name: Maxwell Harmon MRN: [id number] Date of Birth: 01/02/1956  Today's Date: 05/31/2021 PT Individual Time: 1100-1210 PT Individual Time Calculation (min): 70 min   Short Term Goals: Week 2:  PT Short Term Goal 1 (Week 2): =LTG due to ELOS  Skilled Therapeutic Interventions/Progress Updates: Tx1: Pt presented in bed sleeping and requiring significant stimuli to arouse. Pt very lethargic and unable to maintain arousal for more than a few seconds. Pt was able to communicate that he received sleeping meds last night. Despite PTA leaving lights on, using wet washcloth and providing pt with ice water pt unable to maintain arousal for participation in therapy.  PTA returned 30 min later pt was still very lethargic and unable to stay aroused. Pt missed 60 min skilled PT due to lethargy.   Tx2: Pt presented in w/c awake and alert. Pt agreeable to therapy. Pt denies pain at rest, noted some gasping/guarding at end of session during bed mobility. Pt donned gloves and performed w/c propulsion supervision level with increased time/effort. Pt noted to frequently drift to R but was able to correct with increased time. Pt transported to parallel bars and performed Sit to stand x 3 with small hops when able. Pt was able to hop x 3, x6, x3 respectively. Pt unable to maintain full RLE knee extension with hop and as pt fatigued however no overt knee buckling with activity. Pt also participated in ball taps with RW for standing tolerance and dynamic balance. Pt required minA for STS with RW and verbal cues for increased anterior translation of hips and full extension of R knee. Pt was able to performed x 2 then 10 taps with CGA before requiring seated rest due to fatigue. Pt attempted to stand again however was unable to maintain standing thus activity ended. PTA reviewed leg rest management with pt as pt had some difficulty releasing L limb rest at start of session  however was able to lock and unlock both sides with increased time and verbal cues. Pt propelled back to room at end of session and performed squat pivot transfer to bed with CGA. Nsg arrived to check wound on buttock and pt was able to perform L/R rolling in bed with bed rail, increased time, and supervision. Once completed pt repositioned to comfort and pt left with bed alarm on, call bell within reach and needs met.       Therapy Documentation Precautions:  Precautions Precautions: Fall Precaution Comments: charcot foot, wound VAC, LLE NWB Required Braces or Orthoses: Other Brace Other Brace: Limb Guard Restrictions Weight Bearing Restrictions: Yes LLE Weight Bearing: Non weight bearing Other Position/Activity Restrictions: limb protector for mobility General: PT Amount of Missed Time (min): 60 Minutes PT Missed Treatment Reason: Patient fatigue Vital Signs:     Therapy/Group: Individual Therapy  Zarina Pe Dariyah Garduno, PTA  05/31/2021, 12:50 PM

## 2021-05-31 NOTE — Progress Notes (Signed)
[facility] Progress Note   Outpatient orders: [facility], MWF, F180, 3hrs, 2k, 2.5cal, 137 na, 35bicarb, 400bfr, 16g, edw 86.5kg. hep 2k bolus  Assessment/ Plan:   ESRD: has been on HD due to issues with CCPD: inadequate drainage/UF (catheter malfunction?)  - no plans for further PD and is being scheduled to have PD cath removed as an outpatient, therefore no PD cath flushing needed.  Dressing to be changed daily - RN will facilitate;  -HD MWF, next HD today; able to net UF 4L on 7/25  -7/13 AVF infiltration thought to be due to arm moved - no further issues.    Cellulitis/diabetic foot, progressive gangrene Left BKA  7/8. [name]. [name]   Volume/ hypertension: EDW 86.5kg pre amputation. Over EDW and with possible pulm edema - push UF as able but also hypotensive-  profile-  low temp and midodrine pre HD.   Makes urine-  dosing with lasix to help to get to EDW -  now some issues with urinary retention-   placed on flomax but still req in and out caths   Anemia of Chronic Kidney Disease: Hemoglobin 8.0. No active infection so gave 0.5g IV iron load over 4 HD treatments. On aranesp here. Transfuse prn-  getting labs only on HD   Secondary Hyperparathyroidism/Hyperphosphatemia: stopped auryxia dose with phos running on low side (3.3 on 7/25)    -  also on calcitriol, sensipar- getting labs only with HD   DM2 w/ hyperglycemia -mgmt per primary service   Hypoalbuminemia, protein calorie malnutrition -push protein    Subjective:   Denies f/c/n/v/dyspnea; did not sleep well last night.   Objective:   BP 119/68 (BP Location: Right Arm)   Pulse 84   Temp 98.2 F (36.8 C)   Resp 18   Ht 5\' 11"  (1.803 m)   Wt 89.2 kg   SpO2 97%   BMI 27.43 kg/m   Intake/Output Summary (Last 24 hours) at 05/31/2021 0827 Last data filed at 05/31/2021 0800 Gross per 24 hour  Intake 638 ml  Output 1 ml  Net 637 ml   Weight change: -4.2 kg  Physical Exam: Gen:nad, nontoxic  appearing CVS:rrr Resp:normal wob on RA, a few basilar rales ZOX:WRUEA RLE 1+ edema, LLE amputated with immobilizer on Neuro: somnolent but arousable Access: lt BCF  +b/t  Imaging: No results found.  Labs: BMET Recent Labs  Lab 05/24/21 1417 05/26/21 2048 05/29/21 1445  NA 131* 133* 129*  K 5.2* 4.2 4.7  CL 96* 95* 93*  CO2 28 31 25   GLUCOSE 161* 205* 172*  BUN 67* 39* 84*  CREATININE 5.31* 3.46* 6.08*  CALCIUM 9.1 9.1 9.5  PHOS 2.5 1.6* 3.3   CBC Recent Labs  Lab 05/24/21 1417 05/26/21 2048 05/29/21 1445  WBC 7.7 5.8 5.8  HGB 8.3* 8.8* 8.6*  HCT 27.7* 28.2* 27.3*  MCV 104.5* 104.1* 104.2*  PLT 178 169 172    Medications:     aspirin EC  81 mg Oral Daily   atorvastatin  80 mg Oral Daily   calcitRIOL  0.25 mcg Oral BID   Chlorhexidine Gluconate Cloth  6 each Topical Q0600   Chlorhexidine Gluconate Cloth  6 each Topical Q0600   cinacalcet  30 mg Oral Q Mon   clopidogrel  75 mg Oral Daily   darbepoetin ([name]) injection - DIALYSIS  150 mcg Intravenous Q Wed-HD   feeding supplement (NEPRO CARB STEADY)  237 mL Oral QHS   furosemide  80 mg  Oral BID   gabapentin  200 mg Oral QHS   gentamicin cream   Topical Daily   heparin  5,000 Units Subcutaneous Q8H   insulin aspart  0-6 Units Subcutaneous TID WC   insulin glargine  7 Units Subcutaneous QHS   linaclotide  145 mcg Oral QAC breakfast   midodrine  10 mg Oral Q M,W,F-HD   multivitamin  1 tablet Oral QHS   nutrition supplement (JUVEN)  1 packet Oral BID BM   pantoprazole  40 mg Oral Daily   pentoxifylline  400 mg Oral Q breakfast   QUEtiapine  25 mg Oral QHS   senna-docusate  1 tablet Oral BID   sorbitol  30 mL Oral BID   tamsulosin  0.8 mg Oral QHS   zolpidem  10 mg Oral QHS      Otelia Santee, MD 05/31/2021, 8:27 AM

## 2021-05-31 NOTE — Progress Notes (Addendum)
Called hemo to find out when pt will be having dialysis spoke with hemo RN that pt will not be receiving dialysis today. He was moved to tomorrow.    Hemo RN made aware pt will have family education until 1500 tomorrow  PD cath dressing changed  Bladder scan volume: 449 @1825 

## 2021-05-31 NOTE — Progress Notes (Signed)
Pt restless throughout the night. Reports inability to sleep. Given scheduled sleep aides with no effects. PRN klonopin and benadryl (2 hours apart) given per request of pt-not effective upon hourly reassessment. Pt informed that there was nothing else to give him at that time, however pt repositioned comfortably in bed and stimuli decreased. Pt states "how am I going to be able to do therapy if I can't sleep?" Pt informed that info would be relayed to Dr. Dagoberto Ligas for followup. Pt declines prn pain meds however grimaces with turn and repositioning throughout the night, states "I don't need that I just need to sleep". Pt fell asleep at 4am, 3 hours of sleep noted this shift. Pt noted to be very drowsy with am care, verbally responsive however states "I just can't keep my eyes open". Pt awakening for brief periods throughout care. Vital signs stable. Pt relayed that he wanted to eat breakfast, however pt's breakfast held due to not being alert and risk for aspiration. Primary nurse made aware, Dr. Dagoberto Ligas on unit for assessment and made aware as well.

## 2021-05-31 NOTE — Progress Notes (Signed)
Occupational Therapy Session Note  Patient Details  Name: Maxwell Harmon MRN: [id number] Date of Birth: [birth year]  Today's Date: 05/31/2021 OT Individual Time: 5638-7564 OT Individual Time Calculation (min): 60 min    Short Term Goals: Week 2:  OT Short Term Goal 1 (Week 2): Pt will complete LB dressing with min A and AE prn. OT Short Term Goal 2 (Week 2): Pt will complete functional transfers to all appropriate surfaces with CGA and LRAD. OT Short Term Goal 3 (Week 2): Pt will complete 3/3 aspects of toileting with min A. OT Short Term Goal 4 (Week 2): Pt will groom standing sink side with CGA with no more than one rest break  for improved standing tolerance for ADLs.  Skilled Therapeutic Interventions/Progress Updates:  Pt received asleep in bed but easily able to arouse and agreeable to OT intervention. Pt currently requires MIN A for bed mobility this session, doffed shrinker and had pt perform stump care with set- up assist of wash cloth, pt able to don fresh shrinker with set- up assist from EOB( education provided on importance of cleaning incision and care of shrinker). Pt completed dressing from EOB with MAX A for LB dressing via lateral leans and x2 sit<>stands to pull pants and underwear up to waist line ( MIN A for sit<>stand from EOB).  MIN A for UB dressing with OH shirt. Pt donned limb guard with set- up assist, and total A needed to don shoe on R foot. Pt required min guard assist throughout dressing from EOB d/t impaired sitting balance but likely more d/t increased lethargy as pt reports he did not sleep last night. Attempted stand pivot transfer from EOB >w/c with RW however pt unable to pivot this session, MOD A for lateral scoot transfer to w/c from EOB. Pt left seated in w/c with alarm belt activated and all needs within reach.   Therapy Documentation Precautions:  Precautions Precautions: Fall Precaution Comments: charcot foot, wound VAC, LLE NWB Required Braces or  Orthoses: Other Brace Other Brace: Limb Guard Restrictions Weight Bearing Restrictions: Yes LLE Weight Bearing: Non weight bearing Other Position/Activity Restrictions: limb protector for mobility General:   Vital Signs:   Pain:  Pt reports pain in R hip upon arrival, pt declined pain meds, provided repositioning and rest breaks as pain mgmt strategies.     Therapy/Group: Individual Therapy  Corinne Ports [facility] 05/31/2021, 12:20 PM

## 2021-05-31 NOTE — Progress Notes (Signed)
PROGRESS NOTE   Subjective/Complaints:   Pt reports "can't keep eyes open".  Didn't go to sleep until just before 4am- but received: Ambien at 8:30 pm, Seroquel 25 mg at 9:30 pm; Klonopin at 12 am; And Benadryl at 1:30 am- likely everything caught up.   Holding AM meds until wakes up Told nursing and myself- now feels like hit by a truck/sedation.  Didn't void, but bladder scan this Am was 375cc- so hasn't built up much urine- will wait to cath until >600c.    ROS: Pt too sedated/sleepy-      Objective:   No results found. Recent Labs    05/29/21 1445  WBC 5.8  HGB 8.6*  HCT 27.3*  PLT 172    Recent Labs    05/29/21 1445  NA 129*  K 4.7  CL 93*  CO2 25  GLUCOSE 172*  BUN 84*  CREATININE 6.08*  CALCIUM 9.5     Intake/Output Summary (Last 24 hours) at 05/31/2021 1148 Last data filed at 05/31/2021 0800 Gross per 24 hour  Intake 638 ml  Output 1 ml  Net 637 ml        Physical Exam: Vital Signs Blood pressure 119/68, pulse 84, temperature 98.2 F (36.8 C), resp. rate 18, height 5\' 11"  (1.803 m), weight 89.2 kg, SpO2 97 %.     General: asleep- nontoxic; woke briefly but right back to sleep;  NAD HENT: conjugate gaze; oropharynx moist CV: regular rate; no JVD Pulmonary: CTA B/L; no W/R/R- good air movement GI: soft, NT, ND, (+)BS Psychiatric: sedated Neurological: sleepy  Musculoskeletal:    Cervical back: Normal range of motion and neck supple.    Comments: Right heel boggy and red. R foot- Charcot food- has no arch- no actual skin broken down- scabs R calf scattered  Skin has foam on it on back of L posterior thigh Also assessed L BKA today- blister that already had is actually deflated in the middle of L BKA- some localized erythema, likely due to sutures and staples- Also has B/L stage II inner buttocks pressure ulcers- which are new- about size of quarter or slightly bigger-  abraded and some serous drainage noted.   L BKA is shaping great- has staples intact- some dried blood noted- shrinker sticking to staples, however no active drainage seen- a little dried serous yellow spots, but no erythema- mild edema.  UE 5/5 B/L in biceps, triceps, WE< grip and FA Les- RLE- HF 4+/5, KF/KE 4/5, DF and PF 4+/5 LLE- HF and KE at least 3+/5- no pain with movement today  Shaping OK- has shrinker on with limb guard over it- C/D/I Skin: L BKA as above.     Comments: IV R forearm LUE fistula-  Scattered scabs on R calf/shin Fallen arch on R foot/charcot foot R heel boggy - slightly less Neurological:    Mental Status: He is alert and oriented to person, place, and time.    Comments: Decreased to absent Sensation to light touch from R knee downwards  RUE twitching occ.    Assessment/Plan: 1. Functional deficits which require 3+ hours per day of interdisciplinary therapy in a  comprehensive inpatient rehab setting. Physiatrist is providing close team supervision and 24 hour management of active medical problems listed below. Physiatrist and rehab team continue to assess barriers to discharge/monitor patient progress toward functional and medical goals  Care Tool:  Bathing    Body parts bathed by patient: Right arm, Chest, Left arm, Abdomen, Front perineal area, Face   Body parts bathed by helper: Right lower leg Body parts n/a: Left lower leg   Bathing assist Assist Level: Moderate Assistance - Patient 50 - 74%     Upper Body Dressing/Undressing Upper body dressing   What is the patient wearing?: Pull over shirt    Upper body assist Assist Level: Set up assist    Lower Body Dressing/Undressing Lower body dressing      What is the patient wearing?: Pants     Lower body assist Assist for lower body dressing: Maximal Assistance - Patient 25 - 49%     Toileting Toileting    Toileting assist Assist for toileting: Maximal Assistance - Patient 25 - 49%      Transfers Chair/bed transfer  Transfers assist     Chair/bed transfer assist level: Minimal Assistance - Patient > 75% (squat pivot) Chair/bed transfer assistive device: Programmer, multimedia   Ambulation assist   Ambulation activity did not occur: Safety/medical concerns  Assist level: Minimal Assistance - Patient > 75% Assistive device: Parallel bars Max distance: 22ft   Walk 10 feet activity   Assist  Walk 10 feet activity did not occur: Safety/medical concerns        Walk 50 feet activity   Assist Walk 50 feet with 2 turns activity did not occur: Safety/medical concerns         Walk 150 feet activity   Assist Walk 150 feet activity did not occur: Safety/medical concerns         Walk 10 feet on uneven surface  activity   Assist Walk 10 feet on uneven surfaces activity did not occur: Safety/medical concerns         Wheelchair     Assist Will patient use wheelchair at discharge?: Yes Type of Wheelchair: Manual    Wheelchair assist level: Supervision/Verbal cueing Max wheelchair distance: 100'    Wheelchair 50 feet with 2 turns activity    Assist        Assist Level: Supervision/Verbal cueing   Wheelchair 150 feet activity     Assist      Assist Level: Maximal Assistance - Patient 25 - 49%   Blood pressure 119/68, pulse 84, temperature 98.2 F (36.8 C), resp. rate 18, height 5\' 11"  (1.803 m), weight 89.2 kg, SpO2 97 %.  Medical Problem List and Plan: 1.  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  Con't [name]/PT and OT 2.  Antithrombotics: -DVT/anticoagulation:  Pharmaceutical: Heparin             -antiplatelet therapy: ASA/Plavix 3. Pain Management: Change dilaudid to oxycodone prn.             --resume gabapentin to help  manage phantom pain.  7/16- reduce Oxy to 2.5 to 5 mg q4  hours prn for pain and ocn't tramadol as well  7/17- pain still an issue- nerve pain and residual limb- on max dose of Gabapentin- will ask therapy to do mirror therapy.  7/18 no pain but very groggy and having tremors , reduce gabapentin to 400mg    7/20- pain doing OK even with reduction in gabapentin to 200 mg QHS- con't regimen  7/26- doing better with lower dose gabapentin- will add Skelaxin- for muscle spasms- and d/c robaxin; and have therapy have him do hip extension! 4. Mood: LCSW to follow for evaluation and support.               -antipsychotic agents: N/A 5. Neuropsych: This patient is capable of making decisions on his own behalf. 6. Skin/Wound Care: Continue wound VAC             --On Zinc and Juven to help promote wound healing.  7/16- remove VAC- is due today-   7/17- has 1 blister, but looks OK- will monitor  7/21- skin looks better- dried blood- no erythema seen- con't shrinker- shaping w  7/22- had foam added for rubbing raw on back of posterior L thigh- will monitor  7/25- Has new B/L inner buttocks wounds/pressure ulcers- uncomfortable- asked nursing to turn him to get off backside. 7/27- stays on his back most of the time- asked him to stay off backside unless eating, but sleeping on back.   7. Fluids/Electrolytes/Nutrition: Strict I/O with daily weights. 8. R -charcot foot: Encourage wearing CAM boot with activity. 9. T2DM: Hgb A1C-8.2. Monitor BS ac/hs             --continue Lantus 5 units with SSI for elevated BS  CBG (last 3)  Recent Labs    05/30/21 1611 05/30/21 2032 05/31/21 0634  GLUCAP 140* 171* 106*   7/25- BG's controlled with 1 epside of night time elevation. 7/27- BG's look good- con't regimen 10. ESRD: HD MWF at the end of the day to facilitate therapy and endurance issues             --No plans to resume PD. Midodrine for BP support in hemo.  7/20- K+ 5.2 today- per renal- also got approval  from renal to increase Flomax 7/21- Per renal for K+ 5.2.  Managed by nephro 7/25- Na 129- per renal 11. CAD w/recent NSTEMI: Treated medically w/ASA, Plavix and Lipitor.   12. Acute on chronic anemia: Loaded with IV ferric gluconate --continue [name] 150 mc weekly with serial CBC to monitor for stability . 13. PAD: On trental, plavix and ASA. 14. Chronic constipation: Continue Sorbitol bid.  7/16- LBM Thursday- gave an addiitonal dose of Sorbitol since cannot give Mg citrate  7/17- LBM overnight- was hard- will con't Sorbitol BID- might need more as time goes on.   7/19- will add Linzess 145 mcg daily- starting tomorrow  7/20- will add Senokot 1 tab BID and con't miralax BID and Sorbitol BID for now- goal to d/c Sorbitol- con't Linzess- just started this AM  7/21- Had good BM, however still hard- change senokot to senokot-S for stool softener benefits.  15. Hyperparathyroidism: Being managed with aruxia and Calcitriol.  16. Insomnia/RLS: Resume gabapentin for phantom pain as well as RLS.             --Klonopin prn for sleep  7/17- cannot use trazodone for sleep- causes urinary retention/makes it worse- will d/c prn trazodone- con't klonopin and add Melatonin- pt said not real helpful, but maybe with klonpin  will help. .   7/19- slept poorly- will try Ambien 5 mg QHS- but no higher. Will monitor  7/20- much improved with Ambien- con't regimen  7/21- will increase Ambien to 10 mg QHS- Ok'd by pharmacy- and add Seroquel 25 mg QHS PRN since "mind racing"- cannot do Elavil?Trazodone due ot urinary retention.   7/22- slept much better with Ambien- 10 mg QHS- no klonopin needed and didn't take seroquel- has seroquel prn.   7/26- will change seroquel to scheduled and see how it goes for sleep.   7/27- will schedule Seroquel 50 mg QHS and ask that nothing be given by midnight for sleep. Since too sedated this AM- held AM meds until wakes up.  17 H/o  Depression/anxiety: Continue Lexapro (started a  few weeks ago) and klonopin.  7/16- will decrease Klonopin to 0.25 mg BID prn since too sleepy/dazed this AM  7/17- d/c lexapro per pt request.  18. Cough 7/16- will check CXR-  shows likely atelectasis since WBC is normal and afebrile.      19. Restart meds for hyperparathyroidism-  72. Oliguria/Urinary retention  7/17- required cath for 500cc overnight; will ask to bladder scan q8 hours- usually voided 1-2x/day per pt. Don't want him getting UTI. 7/20- will increase Flomax to 0.8 mg nightly- still requiring caths.  7/21- no change yet- con't regimen     7/22- will change cath to qday at 8pm daily.  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  7/26- will try letting pt not be cathed today and see if increased volume helps him.  If doesn't work- will have pt learn cathing- don't want to place foley.   7/27- 375cc scan this AM- will let get to 600cc max and then will need to cath, no matter- what 21. B/L Stage II inner buttock pressure ulcers  7/25- will con't local wound care- and monitor- turn to get off backside when in bed- only sit up when eating.   LOS: 13 days A FACE TO FACE EVALUATION WAS PERFORMED  Shawntina Diffee 05/31/2021, 11:48 AM

## 2021-06-01 LAB — GLUCOSE, CAPILLARY
Glucose-Capillary: 76 mg/dL (ref 70–99)
Glucose-Capillary: 85 mg/dL (ref 70–99)
Glucose-Capillary: 83 mg/dL (ref 70–99)

## 2021-06-01 LAB — CBC
HCT: 27.1 % — ABNORMAL LOW (ref 39.0–52.0)
Hemoglobin: 8.4 g/dL — ABNORMAL LOW (ref 13.0–17.0)
MCH: 32.8 pg (ref 26.0–34.0)
MCHC: 31 g/dL (ref 30.0–36.0)
MCV: 105.9 fL — ABNORMAL HIGH (ref 80.0–100.0)
Platelets: 160 10*3/uL (ref 150–400)
RBC: 2.56 MIL/uL — ABNORMAL LOW (ref 4.22–5.81)
RDW: 20.3 % — ABNORMAL HIGH (ref 11.5–15.5)
WBC: 4.6 10*3/uL (ref 4.0–10.5)
nRBC: 0 % (ref 0.0–0.2)

## 2021-06-01 LAB — RENAL FUNCTION PANEL
Albumin: 2.5 g/dL — ABNORMAL LOW (ref 3.5–5.0)
Anion gap: 12 (ref 5–15)
BUN: 71 mg/dL — ABNORMAL HIGH (ref 8–23)
CO2: 23 mmol/L (ref 22–32)
Calcium: 8.8 mg/dL — ABNORMAL LOW (ref 8.9–10.3)
Chloride: 94 mmol/L — ABNORMAL LOW (ref 98–111)
Creatinine, Ser: 6.36 mg/dL — ABNORMAL HIGH (ref 0.61–1.24)
GFR, Estimated: 9 mL/min — ABNORMAL LOW (ref >60)
Glucose, Bld: 144 mg/dL — ABNORMAL HIGH (ref 70–99)
Phosphorus: 4.5 mg/dL (ref 2.5–4.6)
Potassium: 3.9 mmol/L (ref 3.5–5.1)
Sodium: 129 mmol/L — ABNORMAL LOW (ref 135–145)

## 2021-06-01 MED ORDER — DARBEPOETIN ALFA 150 MCG/0.3ML IJ SOSY
150.0000 ug | PREFILLED_SYRINGE | INTRAMUSCULAR | Status: AC
  Administered 2021-06-01: 150 ug via INTRAVENOUS

## 2021-06-01 MED ORDER — LINACLOTIDE 145 MCG PO CAPS
290.0000 ug | ORAL_CAPSULE | Freq: Every day | ORAL | Status: DC
  Administered 2021-06-02 – 2021-06-08 (×7): 290 ug via ORAL
  Filled 2021-06-01 (×7): qty 2

## 2021-06-01 MED ORDER — DARBEPOETIN ALFA 150 MCG/0.3ML IJ SOSY
150.0000 ug | PREFILLED_SYRINGE | INTRAMUSCULAR | Status: DC
  Administered 2021-06-07: 150 ug via INTRAVENOUS
  Filled 2021-06-01: qty 0.3

## 2021-06-01 MED ORDER — POLYETHYLENE GLYCOL 3350 17 G PO PACK
17.0000 g | PACK | Freq: Every day | ORAL | Status: DC
  Administered 2021-06-01 – 2021-06-08 (×8): 17 g via ORAL
  Filled 2021-06-01 (×7): qty 1

## 2021-06-01 MED ORDER — QUETIAPINE FUMARATE 50 MG PO TABS
50.0000 mg | ORAL_TABLET | Freq: Every day | ORAL | Status: DC
  Administered 2021-06-01 – 2021-06-02 (×2): 50 mg via ORAL
  Filled 2021-06-01 (×2): qty 1

## 2021-06-01 MED ORDER — SENNOSIDES-DOCUSATE SODIUM 8.6-50 MG PO TABS
2.0000 | ORAL_TABLET | Freq: Two times a day (BID) | ORAL | Status: DC
  Administered 2021-06-01 – 2021-06-08 (×14): 2 via ORAL
  Filled 2021-06-01 (×15): qty 2

## 2021-06-01 MED ORDER — SORBITOL 70 % SOLN
30.0000 mL | Freq: Once | Status: AC
  Administered 2021-06-01: 30 mL via ORAL
  Filled 2021-06-01: qty 30

## 2021-06-01 MED ORDER — DARBEPOETIN ALFA 150 MCG/0.3ML IJ SOSY: 150.0000 ug | PREFILLED_SYRINGE | INTRAMUSCULAR | Status: DC

## 2021-06-01 NOTE — Progress Notes (Signed)
[facility] Progress Note   Outpatient orders: [place], MWF, F180, 3hrs, 2k, 2.5cal, 137 na, 35bicarb, 400bfr, 16g, edw 86.5kg. hep 2k bolus  Assessment/ Plan:   ESRD: has been on HD due to issues with CCPD: inadequate drainage/UF (catheter malfunction?)  - no plans for further PD and is being scheduled to have PD cath removed as an outpatient, therefore no PD cath flushing needed.  Dressing to be changed daily - RN will facilitate;  -HD MWF, next HD today; able to net UF 4L on 7/25 and then Fri on his regular day. Bumped yest bec of staffing issues.  -7/13 AVF infiltration thought to be due to arm moved - no further issues.    Cellulitis/diabetic foot, progressive gangrene Left BKA  7/8. [name]. [name]   Volume/ hypertension: EDW 86.5kg pre amputation. Over EDW and with possible pulm edema - push UF as able but also hypotensive-  profile-  low temp and midodrine pre HD.   Makes urine-  dosing with lasix to help to get to EDW -  now some issues with urinary retention-   placed on flomax but still req in and out caths   Anemia of Chronic Kidney Disease: Hemoglobin 8.0. No active infection so gave 0.5g IV iron load over 4 HD treatments. On aranesp here. Transfuse prn-  getting labs only on HD   Secondary Hyperparathyroidism/Hyperphosphatemia: stopped auryxia dose with phos running on low side (3.3 on 7/25)    -  also on calcitriol, sensipar- getting labs only with HD   DM2 w/ hyperglycemia -mgmt per primary service   Hypoalbuminemia, protein calorie malnutrition -push protein    Subjective:   Denies f/c/n/v/dyspnea   Objective:   BP 117/73   Pulse 87   Temp 98.5 F (36.9 C) (Oral)   Resp 16   Ht 5\' 11"  (1.803 m)   Wt 91.6 kg   SpO2 93%   BMI 28.17 kg/m   Intake/Output Summary (Last 24 hours) at 06/01/2021 6295 Last data filed at 05/31/2021 2255 Gross per 24 hour  Intake 478 ml  Output 700 ml  Net -222 ml   Weight change: 2.4 kg  Physical  Exam: Gen:nad, nontoxic appearing CVS:rrr Resp:normal wob on RA, a few basilar rales MWU:XLKGM RLE 1+ edema, LLE amputated with immobilizer on Neuro: somnolent but arousable Access: lt BCF  +b/t  Imaging: No results found.  Labs: BMET Recent Labs  Lab 05/26/21 2048 05/29/21 1445  NA 133* 129*  K 4.2 4.7  CL 95* 93*  CO2 31 25  GLUCOSE 205* 172*  BUN 39* 84*  CREATININE 3.46* 6.08*  CALCIUM 9.1 9.5  PHOS 1.6* 3.3   CBC Recent Labs  Lab 05/26/21 2048 05/29/21 1445  WBC 5.8 5.8  HGB 8.8* 8.6*  HCT 28.2* 27.3*  MCV 104.1* 104.2*  PLT 169 172    Medications:     aspirin EC  81 mg Oral Daily   atorvastatin  80 mg Oral Daily   calcitRIOL  0.25 mcg Oral BID   Chlorhexidine Gluconate Cloth  6 each Topical Q0600   Chlorhexidine Gluconate Cloth  6 each Topical Q0600   cinacalcet  30 mg Oral Q Mon   clopidogrel  75 mg Oral Daily   darbepoetin ([name]) injection - DIALYSIS  150 mcg Intravenous Q Wed-HD   feeding supplement (NEPRO CARB STEADY)  237 mL Oral QHS   furosemide  80 mg Oral BID   gabapentin  200 mg Oral QHS   gentamicin cream  Topical Daily   heparin  5,000 Units Subcutaneous Q8H   insulin aspart  0-6 Units Subcutaneous TID WC   insulin glargine  7 Units Subcutaneous QHS   linaclotide  145 mcg Oral QAC breakfast   midodrine  10 mg Oral Q M,W,F-HD   multivitamin  1 tablet Oral QHS   nutrition supplement (JUVEN)  1 packet Oral BID BM   pantoprazole  40 mg Oral Daily   pentoxifylline  400 mg Oral Q breakfast   QUEtiapine  50 mg Oral QHS   senna-docusate  1 tablet Oral BID   sorbitol  30 mL Oral BID   tamsulosin  0.8 mg Oral QHS   zolpidem  10 mg Oral QHS      Otelia Santee, MD 06/01/2021, 7:28 AM

## 2021-06-01 NOTE — Progress Notes (Signed)
Occupational Therapy Session Note  Patient Details  Name: Maxwell Harmon MRN: [id number] Date of Birth: Jan 08, 1956  Today's Date: 06/01/2021 OT Missed Time: 57 Minutes Missed Time Reason: Other (comment) (Pt taken off unit for dialysis)   Short Term Goals: Week 1:  OT Short Term Goal 1 (Week 1): Pt will complete LB dressing with min A and AE prn. OT Short Term Goal 1 - Progress (Week 1): Progressing toward goal OT Short Term Goal 2 (Week 1): Pt will complete functional transfers to all appropriate surfaces with CGA and LRAD. OT Short Term Goal 2 - Progress (Week 1): Progressing toward goal OT Short Term Goal 3 (Week 1): Pt will complete 3/3 aspects of toileting with min A. OT Short Term Goal 3 - Progress (Week 1): Progressing toward goal OT Short Term Goal 4 (Week 1): Pt will groom standing sink side with CGA with no more than one rest break  for improved standing tolerance for ADLs. OT Short Term Goal 4 - Progress (Week 1): Progressing toward goal Week 2:  OT Short Term Goal 1 (Week 2): Pt will complete LB dressing with min A and AE prn. OT Short Term Goal 2 (Week 2): Pt will complete functional transfers to all appropriate surfaces with CGA and LRAD. OT Short Term Goal 3 (Week 2): Pt will complete 3/3 aspects of toileting with min A. OT Short Term Goal 4 (Week 2): Pt will groom standing sink side with CGA with no more than one rest break  for improved standing tolerance for ADLs.   Skilled Therapeutic Interventions/Progress Updates:    Pt and wife greeted at time of scheduled family education session/training with pt in bed resting, stating that transport was on their way coming to get patient for dialysis. Confirmed with staff. While awaiting transport, discussed with pt and wife using slide board when fatigued, method of transfer, DME needs, etc. Transport taking pt for dialysis and missed 60 mins of OT.   Therapy Documentation Precautions:  Precautions Precautions:  Fall Precaution Comments: charcot foot, wound VAC, LLE NWB Required Braces or Orthoses: Other Brace Other Brace: Limb Guard Restrictions Weight Bearing Restrictions: Yes LLE Weight Bearing: Non weight bearing Other Position/Activity Restrictions: limb protector for mobility    Therapy/Group: Individual Therapy  [name] 06/01/2021, 1:40 PM

## 2021-06-01 NOTE — Progress Notes (Signed)
Occupational Therapy Weekly Progress Note  Patient Details  Name: Maxwell Harmon MRN: [id number] Date of Birth: [birth year]  Beginning of progress report period: May 21, 2021 End of progress report period: June 01, 2021   Patient has met 1 of 4 short term goals.  Pt is slowly progressing toward OT goals with transfers fluctuating depending on time of day and alertness with pt performing better in afternoons, more lethargic in mornings. Pt is Max A for LB dressing bed level, Min A for sit <> stands, and CGA for squat pivot/slide board transfers per notes. Family ed was scheduled for 7/28 but had to be moved to 7/30 d/t dialysis. Continue POC and family ed when able.   Patient continues to demonstrate the following deficits: muscle weakness, decreased cardiorespiratoy endurance, impaired timing and sequencing, decreased coordination, and decreased motor planning, and decreased sitting balance, decreased standing balance, decreased postural control, and decreased balance strategies and therefore will continue to benefit from skilled OT intervention to enhance overall performance with BADL and Reduce care partner burden.  Patient progressing toward long term goals..  Continue plan of care.  OT Short Term Goals Week 2:  OT Short Term Goal 1 (Week 2): Pt will complete LB dressing with min A and AE prn. OT Short Term Goal 1 - Progress (Week 2): Progressing toward goal OT Short Term Goal 2 (Week 2): Pt will complete functional transfers to all appropriate surfaces with CGA and LRAD. OT Short Term Goal 2 - Progress (Week 2): Met OT Short Term Goal 3 (Week 2): Pt will complete 3/3 aspects of toileting with min A. OT Short Term Goal 3 - Progress (Week 2): Progressing toward goal OT Short Term Goal 4 (Week 2): Pt will groom standing sink side with CGA with no more than one rest break  for improved standing tolerance for ADLs. OT Short Term Goal 4 - Progress (Week 2): Progressing toward goal Week 3:  OT  Short Term Goal 1 (Week 3): STGs = LTGs at Supervision   Therapy Documentation Precautions:  Precautions Precautions: Fall Precaution Comments: charcot foot, wound VAC, LLE NWB Required Braces or Orthoses: Other Brace Other Brace: Limb Guard Restrictions Weight Bearing Restrictions: Yes LLE Weight Bearing: Non weight bearing Other Position/Activity Restrictions: limb protector for mobility    Therapy/Group: Individual Therapy  [name] 06/01/2021, 2:43 PM

## 2021-06-01 NOTE — Progress Notes (Signed)
PROGRESS NOTE   Subjective/Complaints:  Pt reports still sleepy this AM- fell asleep around 3am, but didn't get sleep meds til ~10pm- laid down around 11pm and asleep at 3am- also usually goes ot sleep at 9-10 pm at home.  Says LBM was 3 days ago- But according to chart had one yesterday- said he feels constipated.   Didn't get HD yesterday due to staffing issues. TO get today per nurse.   ROS:  Pt denies SOB, abd pain, CP, N/V/C/D, and vision changes    Objective:   No results found. Recent Labs    05/29/21 1445  WBC 5.8  HGB 8.6*  HCT 27.3*  PLT 172    Recent Labs    05/29/21 1445  NA 129*  K 4.7  CL 93*  CO2 25  GLUCOSE 172*  BUN 84*  CREATININE 6.08*  CALCIUM 9.5     Intake/Output Summary (Last 24 hours) at 06/01/2021 0908 Last data filed at 05/31/2021 2255 Gross per 24 hour  Intake 360 ml  Output 700 ml  Net -340 ml     Pressure Injury 05/18/21 Heel Right Stage 2 -  Partial thickness loss of dermis presenting as a shallow open injury with a red, pink wound bed without slough. small area on heel, covered with foam (Active)  05/18/21 2200  Location: Heel  Location Orientation: Right  Staging: Stage 2 -  Partial thickness loss of dermis presenting as a shallow open injury with a red, pink wound bed without slough.  Wound Description (Comments): small area on heel, covered with foam  Present on Admission: Yes    Physical Exam: Vital Signs Blood pressure 117/73, pulse 87, temperature 98.5 F (36.9 C), temperature source Oral, resp. rate 16, height 5\' 11"  (1.803 m), weight 91.6 kg, SpO2 93 %.      General: awake, alert, appropriate, sitting up in bed eating breakfast- ate 50-75% of meal;  NAD HENT: conjugate gaze; oropharynx moist CV: regular rate; no JVD Pulmonary: CTA B/L; no W/R/R- good air movement GI: soft, NT, ND, (+)BS- hypoactive Psychiatric: appropriate; still depressed  affect Neurological: more alert- much more awake  Musculoskeletal:    Cervical back: Normal range of motion and neck supple.    Comments: Right heel boggy and red. R foot- Charcot food- has no arch- no actual skin broken down- scabs R calf scattered  Skin has foam on it on back of L posterior thigh Also assessed L BKA today- blister that already had is actually deflated in the middle of L BKA- some localized erythema, likely due to sutures and staples- Also has B/L stage II inner buttocks pressure ulcers- which are new- about size of quarter or slightly bigger- abraded and some serous drainage noted.  L BKA has shrinker on it- C/D/I  L BKA is shaping great- has staples intact- some dried blood noted- shrinker sticking to staples, however no active drainage seen- a little dried serous yellow spots, but no erythema- mild edema.  UE 5/5 B/L in biceps, triceps, WE< grip and FA Les- RLE- HF 4+/5, KF/KE 4/5, DF and PF 4+/5 LLE- HF and KE at least 3+/5- no pain  with movement today  Shaping OK- has shrinker on with limb guard over it- C/D/I Skin: L BKA as above.     Comments: IV R forearm LUE fistula-  Scattered scabs on R calf/shin Fallen arch on R foot/charcot foot R heel boggy - slightly less Neurological:    Mental Status: He is alert and oriented to person, place, and time.    Comments: Decreased to absent Sensation to light touch from R knee downwards  RUE twitching occ.    Assessment/Plan: 1. Functional deficits which require 3+ hours per day of interdisciplinary therapy in a comprehensive inpatient rehab setting. Physiatrist is providing close team supervision and 24 hour management of active medical problems listed below. Physiatrist and rehab team continue to assess barriers to discharge/monitor patient progress toward functional and medical goals  Care Tool:  Bathing    Body parts bathed by patient: Right arm, Chest, Left arm, Abdomen, Front perineal area, Face   Body parts  bathed by helper: Right lower leg Body parts n/a: Left lower leg   Bathing assist Assist Level: Moderate Assistance - Patient 50 - 74%     Upper Body Dressing/Undressing Upper body dressing   What is the patient wearing?: Pull over shirt    Upper body assist Assist Level: Minimal Assistance - Patient > 75%    Lower Body Dressing/Undressing Lower body dressing      What is the patient wearing?: Pants, Underwear/pull up     Lower body assist Assist for lower body dressing: Maximal Assistance - Patient 25 - 49%     Toileting Toileting    Toileting assist Assist for toileting: Maximal Assistance - Patient 25 - 49%     Transfers Chair/bed transfer  Transfers assist     Chair/bed transfer assist level: Moderate Assistance - Patient 50 - 74% (lateral scoot transfer) Chair/bed transfer assistive device: Programmer, multimedia   Ambulation assist   Ambulation activity did not occur: Safety/medical concerns  Assist level: Minimal Assistance - Patient > 75% Assistive device: Parallel bars Max distance: 30ft   Walk 10 feet activity   Assist  Walk 10 feet activity did not occur: Safety/medical concerns        Walk 50 feet activity   Assist Walk 50 feet with 2 turns activity did not occur: Safety/medical concerns         Walk 150 feet activity   Assist Walk 150 feet activity did not occur: Safety/medical concerns         Walk 10 feet on uneven surface  activity   Assist Walk 10 feet on uneven surfaces activity did not occur: Safety/medical concerns         Wheelchair     Assist Will patient use wheelchair at discharge?: Yes Type of Wheelchair: Manual    Wheelchair assist level: Supervision/Verbal cueing Max wheelchair distance: 100'    Wheelchair 50 feet with 2 turns activity    Assist        Assist Level: Supervision/Verbal cueing   Wheelchair 150 feet activity     Assist      Assist Level: Maximal  Assistance - Patient 25 - 49%   Blood pressure 117/73, pulse 87, temperature 98.5 F (36.9 C), temperature source Oral, resp. rate 16, height 5\' 11"  (1.803 m), weight 91.6 kg, SpO2 93 %.  Medical Problem List and Plan: 1.  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  Con't [name]/PT and OT 2.  Antithrombotics: -DVT/anticoagulation:  Pharmaceutical: Heparin             -antiplatelet therapy: ASA/Plavix 3. Pain Management: Change dilaudid to oxycodone prn.             --resume gabapentin to help manage phantom pain.  7/16- reduce Oxy to 2.5 to 5 mg q4 hours prn for pain and ocn't tramadol as well  7/17- pain still an issue- nerve pain and residual limb- on max dose of Gabapentin- will ask therapy to do mirror therapy.  7/18 no pain but very groggy and having tremors , reduce gabapentin to 400mg    7/20- pain doing OK even with reduction in gabapentin to 200 mg QHS- con't regimen  7/26- doing better with lower dose gabapentin- will add Skelaxin- for muscle spasms- and d/c robaxin; and have therapy have him do hip extension! 4. Mood: LCSW to follow for evaluation and support.               -antipsychotic agents: N/A 5. Neuropsych: This patient is capable of making decisions on his own behalf. 6. Skin/Wound Care: Continue wound VAC             --On Zinc and Juven to help promote wound healing.  7/16- remove VAC- is due today-   7/17- has 1 blister, but looks OK- will monitor  7/21- skin looks better- dried blood- no erythema seen- con't shrinker- shaping w  7/22- had foam added for rubbing raw on back of posterior L thigh- will monitor  7/25- Has new B/L inner buttocks wounds/pressure ulcers- uncomfortable- asked nursing to turn him to get off backside. 7/27- stays on his back most of the time- asked him to stay  off backside unless eating, but sleeping on back.    7/28- will reassess wounds when in bed/not eating.  7. Fluids/Electrolytes/Nutrition: Strict I/O with daily weights. 8. R -charcot foot: Encourage wearing CAM boot with activity. 9. T2DM: Hgb A1C-8.2. Monitor BS ac/hs             --continue Lantus 5 units with SSI for elevated BS  CBG (last 3)  Recent Labs    05/31/21 1701 05/31/21 2121 06/01/21 0605  GLUCAP 115* 161* 76   7/25- BG's controlled with 1 epside of night time el7/28- BG's 76-161- doing better- con't regimen 10. ESRD: HD MWF at the end of the day to facilitate therapy and endurance issues             --No plans to resume PD. Midodrine for BP support in hemo.  7/20- K+ 5.2 today- per renal- also got approval from renal to increase Flomax 7/21- Per renal for K+ 5.2.  Managed by nephro 7/25- Na 129- per renal 11. CAD w/recent NSTEMI: Treated medically w/ASA, Plavix and Lipitor.   12. Acute on chronic anemia: Loaded with IV ferric gluconate --continue [name] 150 mc weekly with serial CBC to monitor for stability . 13. PAD: On trental, plavix and ASA. 14. Chronic constipation: Continue Sorbitol bid.  7/16- LBM Thursday- gave an addiitonal dose of Sorbitol since cannot give Mg citrate  7/17- LBM overnight- was hard- will con't Sorbitol BID- might need more as time goes on.  7/19- will add Linzess 145 mcg daily- starting tomorrow  7/20- will add Senokot 1 tab BID and con't miralax BID and Sorbitol BID for now- goal to d/c Sorbitol- con't Linzess- just started this AM  7/21- Had good BM, however still hard- change senokot to senokot-S for stool softener benefits.   7/28- LBM yesterday however pt insists it was Monday- asked for more Sorbitol- will increase Senokot to 2 tabs BID and add Miralax daily/increase Linzess to 290 mcg daily and stop scheduled Sorbitol.  15. Hyperparathyroidism: Being managed with aruxia and Calcitriol.  16. Insomnia/RLS: Resume gabapentin for phantom  pain as well as RLS.             --Klonopin prn for sleep  7/17- cannot use trazodone for sleep- causes urinary retention/makes it worse- will d/c prn trazodone- con't klonopin and add Melatonin- pt said not real helpful, but maybe with klonpin will help. .   7/19- slept poorly- will try Ambien 5 mg QHS- but no higher. Will monitor  7/20- much improved with Ambien- con't regimen  7/21- will increase Ambien to 10 mg QHS- Ok'd by pharmacy- and add Seroquel 25 mg QHS PRN since "mind racing"- cannot do Elavil?Trazodone due ot urinary retention.   7/22- slept much better with Ambien- 10 mg QHS- no klonopin needed and didn't take seroquel- has seroquel prn.   7/26- will change seroquel to scheduled and see how it goes for sleep.   7/27- will schedule Seroquel 50 mg QHS and ask that nothing be given by midnight for sleep. Since too sedated this AM- held AM meds until wakes up.   7/28- will change to 8pm so might sleep earlier.  31 H/o  Depression/anxiety: Continue Lexapro (started a few weeks ago) and klonopin.  7/16- will decrease Klonopin to 0.25 mg BID prn since too sleepy/dazed this AM  7/17- d/c lexapro per pt request.  18. Cough 7/16- will check CXR-  shows likely atelectasis since WBC is normal and afebrile.      19. Restart meds for hyperparathyroidism-  18. Oliguria/Urinary retention  7/17- required cath for 500cc overnight; will ask to bladder scan q8 hours- usually voided 1-2x/day per pt. Don't want him getting UTI. 7/20- will increase Flomax to 0.8 mg nightly- still requiring caths.  7/21- no change yet- con't regimen     7/22- will change cath to qday at 8pm daily.  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  7/26- will try letting pt not be cathed today and see if increased volume helps him.  If doesn't work- will have pt learn cathing- don't want to place foley.   7/27- 375cc scan  this AM- will let get to 600cc max and then will need to cath, no matter- what  7/28- didn't void even with 700cc in bladder- will need to teach cathing.  21. B/L Stage II inner buttock pressure ulcers  7/25- will con't local wound care- and monitor- turn to get off backside when in bed- only sit up when eating.   LOS: 14 days A FACE TO FACE EVALUATION WAS PERFORMED  Samya Siciliano 06/01/2021, 9:08 AM

## 2021-06-01 NOTE — Progress Notes (Signed)
Physical Therapy Session Note  Patient Details  Name: Maxwell Harmon MRN: [id number] Date of Birth: [birth year]  Today's Date: 06/01/2021 PT Individual Time: 0805-0900 and 1300-1340 PT Individual Time Calculation (min): 55 min and 40 min  Short Term Goals: Week 2:  PT Short Term Goal 1 (Week 2): =LTG due to ELOS  Skilled Therapeutic Interventions/Progress Updates: Pt presented sitting EOB with nsg present agreeable to therapy. Pt denies pain during session. Pt noted to be quiet lethargic throughout session but more functional than am session with this pt yesterday. Pt with increased posterior lean while sitting EOB but was able to correct with min multimodal cues. Once completed pt performed squat pivot transfer to w/c with minA. Pt propelled supervision with increased time to ortho gym and performed car transfer with SB and CGA to 25in seat height. Pt required min cues for Head/hips relationship and sequencing. Pt then propelled to rehab gym and PTA readjusted tape on leg rest and pt was able to release with more ease. Pt transported back to room for time management and left in w/c with belt alarm on, call bell within reach and needs met.   Tx2: Pt presented in bed with wife present agreeable to therapy. Pt denies pain at rest. Performed supine to sit with supervision, increased time and use of bed features. Limb guard already in place. Discussed with pt's wife current level of functional and advised anticipate that pt will d/c at SUPERVISION W/C level. Explained that currently pt is performing transfers with both SB and squat pivot and that use varies on level of alertness as this am pt required minA for squat pivot to w/c. Also explained that as strength improves and standing tolerance improves pt will progress to stand pivot transfers but that may not happen prior to d/c. Pt then demonstrated squat pivot transfer with CGA to w/c. PTA reviewed leg rests with wife who advised that pt was gifted w/c,  residual limb rest, and slide board. Pt then transported to rehab gym for energy conservation and pt performed Sit to stand from w/c with RW with minA and was able to take x 1 hop however was unable to advance RW. Pt then began propelling w/c to ortho gym for car transfer however was stopped by nsg who advised pt needs to return to bed for HD. Pt transported back to room and pt was able to perform squat pivot transfer to bed with CGA and use of bed rail. Pt was able to transfer to supine with supervision and use of bed rail. Pt repositioned to comfort and discussed with wife when she would be able to return for additional family ed (Sat 1-3). Pt left in bed with bed alarm on, call bell within reach and needs met.       Therapy Documentation Precautions:  Precautions Precautions: Fall Precaution Comments: charcot foot, wound VAC, LLE NWB Required Braces or Orthoses: Other Brace Other Brace: Limb Guard Restrictions Weight Bearing Restrictions: Yes LLE Weight Bearing: Non weight bearing Other Position/Activity Restrictions: limb protector for mobility   Therapy/Group: Individual Therapy  Tanyia Grabbe Korina Tretter, PTA  06/01/2021, 2:37 PM

## 2021-06-01 NOTE — Evaluation (Signed)
Recreational Therapy Assessment and Plan  Patient Details  Name: Maxwell Harmon MRN: [id number] Date of Birth: [birth year] Today's Date: 06/01/2021  Rehab Potential:  good ELOS:   d/c 8/4  Assessment  Hospital Problem: Principal Problem:   Below-knee amputation of left lower extremity ([place]) Active Problems:   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[place])   ESRD on hemodialysis ([place])   Acute on chronic systolic (congestive) heart failure ([place])     Past Medical History:      Past Medical History:  Diagnosis Date   Anemia of chronic disease     CAD (coronary artery disease)     Diabetes mellitus without complication ([place])     ESRD (end stage renal disease) on dialysis ([place])      MWF [place]   History of bleeding ulcers     History of TIAs     Hypertension     NSTEMI (non-ST elevated myocardial infarction) ([place]) 04/2021   PAD (peripheral artery disease) ([place])      Past Surgical History:       Past Surgical History:  Procedure Laterality Date   ABDOMINAL AORTOGRAM W/LOWER EXTREMITY Bilateral 04/14/2021    Procedure: ABDOMINAL AORTOGRAM W/LOWER EXTREMITY;  Surgeon: Elam Dutch, MD;  Location: [facility];  Service: Vascular;  Laterality: Bilateral;   AMPUTATION Left 05/12/2021    Procedure: LEFT BELOW KNEE AMPUTATION;  Surgeon: Newt Minion, MD;  Location: [facility];  Service: Orthopedics;  Laterality: Left;   CATARACT EXTRACTION W/PHACO Left 02/15/2014    Procedure: CATARACT EXTRACTION PHACO AND INTRAOCULAR LENS PLACEMENT (IOC);  Surgeon: Tonny Branch, MD;  Location: [facility];  Service: Ophthalmology;  Laterality: Left;  CDE 10.84   CATARACT EXTRACTION W/PHACO Right 12/14/2013    Procedure: CATARACT EXTRACTION PHACO AND INTRAOCULAR LENS PLACEMENT (IOC);  Surgeon: Tonny Branch, MD;  Location: [facility];  Service: Ophthalmology;  Laterality: Right;  CDE:  16.30   CHOLECYSTECTOMY   02/2013   EYE SURGERY       IR FLUORO GUIDE CV LINE RIGHT    01/05/2019   IR US GUIDE VASC ACCESS RIGHT   01/05/2019   LAPAROSCOPIC NEPHRECTOMY Right 01/08/2019    Procedure: LAPAROSCOPIC RADICAL NEPHRECTOMY;  Surgeon: Raynelle Bring, MD;  Location: [facility];  Service: Urology;  Laterality: Right;   LEFT HEART CATH AND CORS/GRAFTS ANGIOGRAPHY N/A 04/14/2021    Procedure: LEFT HEART CATH AND CORS/GRAFTS ANGIOGRAPHY;  Surgeon: Jolaine Artist, MD;  Location: [facility];  Service: Cardiovascular;  Laterality: N/A;   PENILE PROSTHESIS IMPLANT       PERIPHERAL VASCULAR INTERVENTION Left 04/19/2021    Procedure: PERIPHERAL VASCULAR INTERVENTION;  Surgeon: Cherre Robins, MD;  Location: [facility];  Service: Cardiovascular;  Laterality: Left;   TOE AMPUTATION Right 2013    [facility]      Assessment & Plan Clinical Impression: Patient is a 65 y.o. male admitted 7/3 with diagnosis of cellulitis LLE. Pt presented with gangrenous ischemic changes of the left heel ulcer. 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 PMH: CHF, ESRD (switched from PD to HD 04/2021), hypertension, CAD s/p CABG 2021, PAD h/o limb ischemia, Charcot foot, hyperlipidemia, diabetes, TIA  Patient transferred to [facility] on 05/18/2021 .     Met with pt today to discuss leisure education, activity analysis/modifications and coping.  Pt groggy throughout session but responsive to voice/name when nodding off.  Pt anxious about discharge, hoping things will go smoothly.  Pt presents with decreased activity tolerance, decreased functional mobility, decreased balance, feeling of stress anxiety Limiting pt's independence with leisure/community pursuits. Plan   Min 1 TR session >20 minutes during LOS  Recommendations for other services: None   Discharge Criteria: Patient will be discharged from TR if patient refuses treatment 3 consecutive times without medical reason.  If treatment goals not met, if there is a change in medical status, if patient makes no progress towards goals or if patient is  discharged from hospital.  The above assessment, treatment plan, treatment alternatives and goals were discussed and mutually agreed upon: by patient  [place] 06/01/2021, 2:34 PM

## 2021-06-02 LAB — GLUCOSE, CAPILLARY
Glucose-Capillary: 91 mg/dL (ref 70–99)
Glucose-Capillary: 112 mg/dL — ABNORMAL HIGH (ref 70–99)
Glucose-Capillary: 115 mg/dL — ABNORMAL HIGH (ref 70–99)
Glucose-Capillary: 163 mg/dL — ABNORMAL HIGH (ref 70–99)

## 2021-06-02 LAB — RENAL FUNCTION PANEL
Albumin: 2.7 g/dL — ABNORMAL LOW (ref 3.5–5.0)
Anion gap: 11 (ref 5–15)
BUN: 38 mg/dL — ABNORMAL HIGH (ref 8–23)
CO2: 24 mmol/L (ref 22–32)
Calcium: 8.8 mg/dL — ABNORMAL LOW (ref 8.9–10.3)
Chloride: 97 mmol/L — ABNORMAL LOW (ref 98–111)
Creatinine, Ser: 4.8 mg/dL — ABNORMAL HIGH (ref 0.61–1.24)
GFR, Estimated: 13 mL/min — ABNORMAL LOW (ref >60)
Glucose, Bld: 117 mg/dL — ABNORMAL HIGH (ref 70–99)
Phosphorus: 3.3 mg/dL (ref 2.5–4.6)
Potassium: 3.7 mmol/L (ref 3.5–5.1)
Sodium: 132 mmol/L — ABNORMAL LOW (ref 135–145)

## 2021-06-02 LAB — CBC
HCT: 29 % — ABNORMAL LOW (ref 39.0–52.0)
Hemoglobin: 9.1 g/dL — ABNORMAL LOW (ref 13.0–17.0)
MCH: 33 pg (ref 26.0–34.0)
MCHC: 31.4 g/dL (ref 30.0–36.0)
MCV: 105.1 fL — ABNORMAL HIGH (ref 80.0–100.0)
Platelets: 162 10*3/uL (ref 150–400)
RBC: 2.76 MIL/uL — ABNORMAL LOW (ref 4.22–5.81)
RDW: 20.1 % — ABNORMAL HIGH (ref 11.5–15.5)
WBC: 4.5 10*3/uL (ref 4.0–10.5)
nRBC: 0 % (ref 0.0–0.2)

## 2021-06-02 MED ORDER — GABAPENTIN 300 MG PO CAPS
300.0000 mg | ORAL_CAPSULE | Freq: Every day | ORAL | Status: DC
  Administered 2021-06-02 – 2021-06-07 (×6): 300 mg via ORAL
  Filled 2021-06-02 (×6): qty 1

## 2021-06-02 NOTE — Progress Notes (Signed)
PROGRESS NOTE   Subjective/Complaints:  Pt didn't sleep again last night- found slept for entire HD 3.5 hours yesterday so that likely didn't help him sleep last night.   Will try increasing Gabapentin to 300 mg QHS- since made him sleep when given - if that doesn't work, suggest Lunesta instead of Ambien?  ROS:  Pt denies SOB, abd pain, CP, N/V/C/D, and vision changes But sleepy     Objective:   No results found. Recent Labs    06/01/21 1419 06/02/21 1325  WBC 4.6 4.5  HGB 8.4* 9.1*  HCT 27.1* 29.0*  PLT 160 162    Recent Labs    06/01/21 1419 06/02/21 1325  NA 129* 132*  K 3.9 3.7  CL 94* 97*  CO2 23 24  GLUCOSE 144* 117*  BUN 71* 38*  CREATININE 6.36* 4.80*  CALCIUM 8.8* 8.8*     Intake/Output Summary (Last 24 hours) at 06/02/2021 1427 Last data filed at 06/02/2021 0820 Gross per 24 hour  Intake 240 ml  Output 3000 ml  Net -2760 ml     Pressure Injury 05/18/21 Heel Right Stage 2 -  Partial thickness loss of dermis presenting as a shallow open injury with a red, pink wound bed without slough. small area on heel, covered with foam (Active)  05/18/21 2200  Location: Heel  Location Orientation: Right  Staging: Stage 2 -  Partial thickness loss of dermis presenting as a shallow open injury with a red, pink wound bed without slough.  Wound Description (Comments): small area on heel, covered with foam  Present on Admission: Yes    Physical Exam: Vital Signs Blood pressure (!) 105/59, pulse 78, temperature 97.9 F (36.6 C), temperature source Oral, resp. rate 16, height 5\' 11"  (1.803 m), weight 89.2 kg, SpO2 98 %.      General: sleeping sitting up eating breakfast- not as sedated as 2 days ago, but very sleepy; NAD HENT: conjugate gaze; oropharynx moist CV: regular rate; no JVD Pulmonary: CTA B/L; no W/R/R- good air movement GI: soft, NT, ND, (+)BS Psychiatric: sleepy Neurological:  alert   Musculoskeletal:    Cervical back: Normal range of motion and neck supple.    Comments: Right heel boggy and red. R foot- Charcot food- has no arch- no actual skin broken down- scabs R calf scattered  Skin has foam on it on back of L posterior thigh Also assessed L BKA today- blister that already had is actually deflated in the middle of L BKA- some localized erythema, likely due to sutures and staples- Also has B/L stage II inner buttocks pressure ulcers- which are new- about size of quarter or slightly bigger- abraded and some serous drainage noted.  L BKA has shrinker on it- C/D/I with limb guard in place- looks good/shaping.   L BKA is shaping great- has staples intact- some dried blood noted- shrinker sticking to staples, however no active drainage seen- a little dried serous yellow spots, but no erythema- mild edema.  UE 5/5 B/L in biceps, triceps, WE< grip and FA Les- RLE- HF 4+/5, KF/KE 4/5, DF and PF 4+/5 LLE- HF and KE at least 3+/5-  no pain with movement today  Shaping OK- has shrinker on with limb guard over it- C/D/I Skin: L BKA as above.     Comments: IV R forearm LUE fistula-  Scattered scabs on R calf/shin Fallen arch on R foot/charcot foot R heel boggy - slightly less Neurological:    Mental Status: He is alert and oriented to person, place, and time.    Comments: Decreased to absent Sensation to light touch from R knee downwards  RUE twitching occ.    Assessment/Plan: 1. Functional deficits which require 3+ hours per day of interdisciplinary therapy in a comprehensive inpatient rehab setting. Physiatrist is providing close team supervision and 24 hour management of active medical problems listed below. Physiatrist and rehab team continue to assess barriers to discharge/monitor patient progress toward functional and medical goals  Care Tool:  Bathing    Body parts bathed by patient: Right arm, Chest, Left arm, Abdomen, Front perineal area, Face   Body  parts bathed by helper: Right lower leg Body parts n/a: Left lower leg   Bathing assist Assist Level: Moderate Assistance - Patient 50 - 74%     Upper Body Dressing/Undressing Upper body dressing   What is the patient wearing?: Pull over shirt    Upper body assist Assist Level: Minimal Assistance - Patient > 75%    Lower Body Dressing/Undressing Lower body dressing      What is the patient wearing?: Pants, Underwear/pull up     Lower body assist Assist for lower body dressing: Maximal Assistance - Patient 25 - 49%     Toileting Toileting    Toileting assist Assist for toileting: Maximal Assistance - Patient 25 - 49%     Transfers Chair/bed transfer  Transfers assist     Chair/bed transfer assist level: Moderate Assistance - Patient 50 - 74% (lateral scoot transfer) Chair/bed transfer assistive device: Programmer, multimedia   Ambulation assist   Ambulation activity did not occur: Safety/medical concerns  Assist level: Minimal Assistance - Patient > 75% Assistive device: Parallel bars Max distance: 33ft   Walk 10 feet activity   Assist  Walk 10 feet activity did not occur: Safety/medical concerns        Walk 50 feet activity   Assist Walk 50 feet with 2 turns activity did not occur: Safety/medical concerns         Walk 150 feet activity   Assist Walk 150 feet activity did not occur: Safety/medical concerns         Walk 10 feet on uneven surface  activity   Assist Walk 10 feet on uneven surfaces activity did not occur: Safety/medical concerns         Wheelchair     Assist Will patient use wheelchair at discharge?: Yes Type of Wheelchair: Manual    Wheelchair assist level: Supervision/Verbal cueing Max wheelchair distance: 100'    Wheelchair 50 feet with 2 turns activity    Assist        Assist Level: Supervision/Verbal cueing   Wheelchair 150 feet activity     Assist      Assist Level: Maximal  Assistance - Patient 25 - 49%   Blood pressure (!) 105/59, pulse 78, temperature 97.9 F (36.6 C), temperature source Oral, resp. rate 16, height 5\' 11"  (1.803 m), weight 89.2 kg, SpO2 98 %.  Medical Problem List and Plan: 1.  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  Con't [name] and [name] 2.  Antithrombotics: -DVT/anticoagulation:  Pharmaceutical: Heparin             -antiplatelet therapy: ASA/Plavix 3. Pain Management: Change dilaudid to oxycodone prn.             --resume gabapentin to help manage phantom pain.  7/16- reduce Oxy to 2.5 to 5 mg q4 hours prn for pain and ocn't tramadol as well  7/17- pain still an issue- nerve pain and residual limb- on max dose of Gabapentin- will ask therapy to do mirror therapy.  7/18 no pain but very groggy and having tremors , reduce gabapentin to 400mg    7/20- pain doing OK even with reduction in gabapentin to 200 mg QHS- con't regimen  7/26- doing better with lower dose gabapentin- will add Skelaxin- for muscle spasms- and d/c robaxin; and have therapy have him do hip extension!  7/29- will increase gabapentin to 300 mg QHS since helped sleep some- since sleeping so poorly.  4. Mood: LCSW to follow for evaluation and support.               -antipsychotic agents: N/A 5. Neuropsych: This patient is capable of making decisions on his own behalf. 6. Skin/Wound Care: Continue wound VAC             --On Zinc and Juven to help promote wound healing.  7/16- remove VAC- is due today-   7/17- has 1 blister, but looks OK- will monitor  7/21- skin looks better- dried blood- no erythema seen- con't shrinker- shaping w  7/22- had foam added for rubbing raw on back of posterior L thigh- will monitor  7/25- Has new B/L inner buttocks wounds/pressure ulcers- uncomfortable- asked  nursing to turn him to get off backside. 7/27- stays on his back most of the time- asked him to stay off backside unless eating, but sleeping on back.    7/28- will reassess wounds when in bed/not eating.   7/29- keep seeing sitting up in w/c- cannot assess wounds 7. Fluids/Electrolytes/Nutrition: Strict I/O with daily weights. 8. R -charcot foot: Encourage wearing CAM boot with activity. 9. T2DM: Hgb A1C-8.2. Monitor BS ac/hs             --continue Lantus 5 units with SSI for elevated BS  CBG (last 3)  Recent Labs    06/01/21 1909 06/02/21 0607 06/02/21 1207  GLUCAP 83 91 112*   7/29- BG's controlled- con't regimen- is eating less today due to sedation- monitor BG's.  10. ESRD: HD MWF at the end of the day to facilitate therapy and endurance issues             --No plans to resume PD. Midodrine for BP support in hemo.  7/20- K+ 5.2 today- per renal- also got approval from renal to increase Flomax 7/21- Per renal for K+ 5.2.  Managed by nephro 7/25- Na 129- per renal 7/29- Na up to 132- con't per renal 11. CAD w/recent NSTEMI: Treated medically w/ASA, Plavix and Lipitor.   12. Acute on chronic anemia: Loaded with IV ferric gluconate --continue [name] 150 mc weekly with serial CBC to monitor for stability . 13. PAD: On trental, plavix and ASA. 14.  Chronic constipation: Continue Sorbitol bid.  7/16- LBM Thursday- gave an addiitonal dose of Sorbitol since cannot give Mg citrate  7/17- LBM overnight- was hard- will con't Sorbitol BID- might need more as time goes on.   7/19- will add Linzess 145 mcg daily- starting tomorrow  7/20- will add Senokot 1 tab BID and con't miralax BID and Sorbitol BID for now- goal to d/c Sorbitol- con't Linzess- just started this AM  7/21- Had good BM, however still hard- change senokot to senokot-S for stool softener benefits.   7/28- LBM yesterday however pt insists it was Monday- asked for more Sorbitol- will increase Senokot to 2 tabs BID and add Miralax  daily/increase Linzess to 290 mcg daily and stop scheduled Sorbitol.  15. Hyperparathyroidism: Being managed with aruxia and Calcitriol.  16. Insomnia/RLS: Resume gabapentin for phantom pain as well as RLS.             --Klonopin prn for sleep  7/17- cannot use trazodone for sleep- causes urinary retention/makes it worse- will d/c prn trazodone- con't klonopin and add Melatonin- pt said not real helpful, but maybe with klonpin will help. .   7/19- slept poorly- will try Ambien 5 mg QHS- but no higher. Will monitor  7/20- much improved with Ambien- con't regimen  7/21- will increase Ambien to 10 mg QHS- Ok'd by pharmacy- and add Seroquel 25 mg QHS PRN since "mind racing"- cannot do Elavil?Trazodone due ot urinary retention.   7/22- slept much better with Ambien- 10 mg QHS- no klonopin needed and didn't take seroquel- has seroquel prn.   7/26- will change seroquel to scheduled and see how it goes for sleep.   7/27- will schedule Seroquel 50 mg QHS and ask that nothing be given by midnight for sleep. Since too sedated this AM- held AM meds until wakes up.   7/28- will change to 8pm so might sleep earlier.   7/29- poor sleep but slept almost 4 hours in HD- will increase gabapentin to 300 mg QHS and if that doesn't work, suggest Physiological scientist.  73 H/o  Depression/anxiety: Continue Lexapro (started a few weeks ago) and klonopin.  7/16- will decrease Klonopin to 0.25 mg BID prn since too sleepy/dazed this AM  7/17- d/c lexapro per pt request.  18. Cough 7/16- will check CXR-  shows likely atelectasis since WBC is normal and afebrile.      19. Restart meds for hyperparathyroidism-  17. Oliguria/Urinary retention  7/17- required cath for 500cc overnight; will ask to bladder scan q8 hours- usually voided 1-2x/day per pt. Don't want him getting UTI. 7/20- will increase Flomax to 0.8 mg nightly- still requiring caths.  7/21- no change yet- con't regimen     7/22- will change cath to qday at 8pm daily.  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  7/26- will try letting pt not be cathed today and see if increased volume helps him.  If doesn't work- will have pt learn cathing- don't want to place foley.   7/27- 375cc scan this AM- will let get to 600cc max and then will need to cath, no matter- what  7/28- didn't void even with 700cc in bladder- will need to teach cathing.  21. B/L Stage II inner buttock pressure ulcers  7/25- will con't local wound care- and monitor- turn to get off backside when in bed- only sit up when eating.   LOS:  15 days A FACE TO FACE EVALUATION WAS PERFORMED  Lavetta Geier 06/02/2021, 2:27 PM

## 2021-06-02 NOTE — Progress Notes (Signed)
Occupational Therapy Session Note  Patient Details  Name: Maxwell Harmon MRN: [id number] Date of Birth: 1956/02/15  Today's Date: 06/02/2021 OT Individual Time: 1041-1108 OT Individual Time Calculation (min): 27 min    Short Term Goals: Week 3:  OT Short Term Goal 1 (Week 3): STGs = LTGs at Supervision  Skilled Therapeutic Interventions/Progress Updates:  Pt received seated in w/c agreeable to OT intervention. Pt completed w/c propulsion to apartment with supervision, attempted stand pivot transfer from w/c>bed with pt able to stand with MIN A but unable to pivot ( feel likely d/t environment factor of pt being unable to pivot foot on carpet).  Pt completed w/c propulsion to therapy gym with supervision with remainder of session to focus on transfer training via stand pivot transfer with pt completing x2 trials from w/c<>EOM with MIN A. Pt left in gym for group session.   Therapy Documentation Precautions:  Precautions Precautions: Fall Precaution Comments: charcot foot, wound VAC, LLE NWB Required Braces or Orthoses: Other Brace Other Brace: Limb Guard Restrictions Weight Bearing Restrictions: Yes LLE Weight Bearing: Non weight bearing Other Position/Activity Restrictions: limb protector for mobility  Pain:  Pt reports no pain during session.     Therapy/Group: Individual Therapy  Precious Haws 06/02/2021, 1:27 PM

## 2021-06-02 NOTE — Progress Notes (Signed)
Recreational Therapy Session Note  Patient Details  Name: Maxwell Harmon MRN: [id number] Date of Birth: 11-Aug-1956 Today's Date: 06/02/2021  Pain: no c/o Skilled Therapeutic Interventions/Progress Updates: Time: Pain:  Pt referred by team for participation in Stress Management/Relaxation Training group with LRT & COTA co-facilitating discussion on stress exploration.  Discussion/education included identifying stressors, categorizing them, & identifying potential coping strategies.  Strategies included deep breathing exercise, progressive muscle relaxation, challenging irrational thoughts & imagery.  Pt participated in group discussion, took notes and provided with education materials for in room and home use.  Therapy/Group: Group Therapy Gunter Conde 06/02/2021, 12:26 PM

## 2021-06-02 NOTE — Progress Notes (Signed)
Physical Therapy Session Note  Patient Details  Name: Maxwell Harmon MRN: [id number] Date of Birth: December 17, 1955  Today's Date: 06/02/2021 PT Individual Time: 3291-9166 and [phone] PT Individual Time Calculation (min): 30 min and 60 min  Short Term Goals: Week 2:  PT Short Term Goal 1 (Week 2): =LTG due to ELOS  Skilled Therapeutic Interventions/Progress Updates:  Tx1: Pt presented in w/c agreeable to therapy. Pt denies pain at rest. PTA transported pt to rehab gym for time management. Pt performed squat pivot transfer to mat. Participated in sitting balance/core activities including lateral leans to mat x 5 bilaterally, D1/D2 chops with 2Kg weighted ball x 10 bilaterally and chest press with 2Kg x 10. Performed squat pivot transfer back to w/c CGA and pt transported back to room for time management. Pt left in w/c at end of session with belt alarm on, call bell within reach and needs met.   Tx2: Pt presented in w/c agreeable to therapy. Pt denies pain during session. Session focused on Sit to stand transfers and standing tolerance. Pt propelled to rehab gym with supervision and increased time. Performed Sit to stand in parallel bars x 3 and participated in pre-gait hopping x 46f, x545f and [id number]. Pt demonstrated improved R foot clearance on this date with activity. Pt also participated in standing LLE hip abd, flexion, and extension 2 x 10 with pt able to maintain standing through activity. Once outside parallel bars pt performed Sit to stand x 5 with consistent CGA and pt demonstrating improved fluidity transitioning to full standing. Pt also was able to ambulate with minA and close w/c follow ~[id number] two bouts. With fatigue pt demonstrated decreased R foot clearance. Pt propelled back to room at end of session and remained in w/c with belt alarm on, call bell within reach and needs met.      Therapy Documentation Precautions:  Precautions Precautions: Fall Precaution Comments:  charcot foot, wound VAC, LLE NWB Required Braces or Orthoses: Other Brace Other Brace: Limb Guard Restrictions Weight Bearing Restrictions: Yes LLE Weight Bearing: Non weight bearing Other Position/Activity Restrictions: limb protector for mobility   Therapy/Group: Individual Therapy  Maycol Hoying Paizlie Klaus, PTA  06/02/2021, 11:14 AM

## 2021-06-02 NOTE — Progress Notes (Signed)
Occupational Therapy Session Note  Patient Details  Name: Maxwell Harmon MRN: [id number] Date of Birth: December 21, 1955  Today's Date: 06/02/2021 OT Group Time: 1108-1208 OT Group Time Calculation (min): 60 min   Short Term Goals: Week 3:  OT Short Term Goal 1 (Week 3): STGs = LTGs at Supervision  Skilled Therapeutic Interventions/Progress Updates:  Pt was seen for skilled group session with focus of group session on stress management, coping strategies and social engagement. Education provided on factors that contribute to stress such as daily hassles, major life stressors, and life circumstance. Pt participated by sharing major and daily life stressors with group, pt noted to be emotional when speaking about stressors, offered emotional support and encouragement. Education provided on healthy coping strategies to implement into pts routine to manage stressors, pt participated by sharing healthy coping strategies of sharing daily uplifts such as "looking forward to seeing his grandkids." Provided pt with handouts to increase carryover of tips for managing stressors, pt completed w/c propulsion back to room with supervision. Pt completed lateral scoot pivot transfer back to bed with MIN A. Pt left with transport as pt to be taken for HD.    Therapy Documentation Precautions:  Precautions Precautions: Fall Precaution Comments: charcot foot, wound VAC, LLE NWB Required Braces or Orthoses: Other Brace Other Brace: Limb Guard Restrictions Weight Bearing Restrictions: Yes LLE Weight Bearing: Non weight bearing Other Position/Activity Restrictions: limb protector for mobility   Pt report no pain during session.    Therapy/Group: Group Therapy  Precious Haws 06/02/2021, 1:32 PM

## 2021-06-02 NOTE — Progress Notes (Addendum)
[facility] Progress Note   Outpatient orders: [place], MWF, F180, 3hrs, 2k, 2.5cal, 137 na, 35bicarb, 400bfr, 16g, edw 86.5kg. hep 2k bolus  Assessment/ Plan:   ESRD: has been on HD due to issues with CCPD: inadequate drainage/UF (catheter malfunction?)  - no plans for further PD and is being scheduled to have PD cath removed as an outpatient, therefore no PD cath flushing needed.  Dressing to be changed daily - RN will facilitate;  -HD MWF, next HD today to get back on schedule; able to net UF 4L on 7/25, 3L 7/28.   -7/13 AVF infiltration thought to be due to arm moved - no further issues.  - Will try to get a floor scale weight with assistance; he has fluid that we need to UF as he's above pre amputation weight.   Cellulitis/diabetic foot, progressive gangrene Left transtibial amputation  7/8. [name]. [name]   Volume/ hypertension: EDW 86.5kg pre amputation. Over EDW and with possible pulm edema - push UF as able but also hypotensive-  profile-  low temp and midodrine pre HD.   Makes urine-  dosing with lasix to help to get to EDW -  now some issues with urinary retention-   placed on flomax but still req in and out caths   Anemia of Chronic Kidney Disease: Hemoglobin 8.0. No active infection so gave 0.5g IV iron load over 4 HD treatments. On aranesp here. Transfuse prn-  getting labs only on HD   Secondary Hyperparathyroidism/Hyperphosphatemia: stopped auryxia dose with phos running on low side (3.3 on 7/25)    -  also on calcitriol, sensipar- getting labs only with HD - Phos 4.5 on 7/29 -> will continue to monitor for now.   DM2 w/ hyperglycemia -mgmt per primary service   Hypoalbuminemia, protein calorie malnutrition -push protein    Subjective:   Denies f/c/n/v/dyspnea   Objective:   BP 110/69 (BP Location: Right Arm)   Pulse 90   Temp 98.1 F (36.7 C) (Oral)   Resp 18   Ht 5\' 11"  (1.803 m)   Wt 89.2 kg   SpO2 96%   BMI 27.43 kg/m    Intake/Output Summary (Last 24 hours) at 06/02/2021 0736 Last data filed at 06/01/2021 1752 Gross per 24 hour  Intake 240 ml  Output 3000 ml  Net -2760 ml   Weight change: -1.8 kg  Physical Exam: Gen:nad, nontoxic appearing CVS:rrr Resp:normal wob on RA, a few basilar rales XAJ:OINOM RLE 1+ edema, LLE amputated with immobilizer on Neuro: somnolent but arousable Access: lt BCF  +b/t  Imaging: No results found.  Labs: BMET Recent Labs  Lab 05/26/21 2048 05/29/21 1445 06/01/21 1419  NA 133* 129* 129*  K 4.2 4.7 3.9  CL 95* 93* 94*  CO2 31 25 23   GLUCOSE 205* 172* 144*  BUN 39* 84* 71*  CREATININE 3.46* 6.08* 6.36*  CALCIUM 9.1 9.5 8.8*  PHOS 1.6* 3.3 4.5   CBC Recent Labs  Lab 05/26/21 2048 05/29/21 1445 06/01/21 1419  WBC 5.8 5.8 4.6  HGB 8.8* 8.6* 8.4*  HCT 28.2* 27.3* 27.1*  MCV 104.1* 104.2* 105.9*  PLT 169 172 160    Medications:     aspirin EC  81 mg Oral Daily   atorvastatin  80 mg Oral Daily   calcitRIOL  0.25 mcg Oral BID   Chlorhexidine Gluconate Cloth  6 each Topical Q0600   Chlorhexidine Gluconate Cloth  6 each Topical Q0600   cinacalcet  30 mg Oral  Q Mon   clopidogrel  75 mg Oral Daily   [START ON 06/07/2021] darbepoetin ([name]) injection - DIALYSIS  150 mcg Intravenous Q Wed-HD   feeding supplement (NEPRO CARB STEADY)  237 mL Oral QHS   furosemide  80 mg Oral BID   gabapentin  200 mg Oral QHS   gentamicin cream   Topical Daily   heparin  5,000 Units Subcutaneous Q8H   insulin aspart  0-6 Units Subcutaneous TID WC   insulin glargine  7 Units Subcutaneous QHS   linaclotide  290 mcg Oral QAC breakfast   midodrine  10 mg Oral Q M,W,F-HD   multivitamin  1 tablet Oral QHS   nutrition supplement (JUVEN)  1 packet Oral BID BM   pantoprazole  40 mg Oral Daily   pentoxifylline  400 mg Oral Q breakfast   polyethylene glycol  17 g Oral Daily   QUEtiapine  50 mg Oral QHS   senna-docusate  2 tablet Oral BID   tamsulosin  0.8 mg Oral QHS    zolpidem  10 mg Oral QHS      Otelia Santee, MD 06/02/2021, 7:36 AM

## 2021-06-03 LAB — GLUCOSE, CAPILLARY
Glucose-Capillary: 51 mg/dL — ABNORMAL LOW (ref 70–99)
Glucose-Capillary: 87 mg/dL (ref 70–99)
Glucose-Capillary: 99 mg/dL (ref 70–99)
Glucose-Capillary: 154 mg/dL — ABNORMAL HIGH (ref 70–99)
Glucose-Capillary: 173 mg/dL — ABNORMAL HIGH (ref 70–99)

## 2021-06-03 MED ORDER — INSULIN GLARGINE 100 UNIT/ML ~~LOC~~ SOLN
6.0000 [IU] | Freq: Every day | SUBCUTANEOUS | Status: DC
  Administered 2021-06-03 – 2021-06-04 (×2): 6 [IU] via SUBCUTANEOUS
  Filled 2021-06-03 (×3): qty 0.06

## 2021-06-03 MED ORDER — TRAZODONE HCL 50 MG PO TABS: 50.0000 mg | ORAL_TABLET | Freq: Every day | ORAL | Status: DC

## 2021-06-03 MED ORDER — QUETIAPINE FUMARATE 50 MG PO TABS
100.0000 mg | ORAL_TABLET | Freq: Every day | ORAL | Status: DC
  Administered 2021-06-03: 100 mg via ORAL
  Filled 2021-06-03: qty 2

## 2021-06-03 NOTE — Progress Notes (Signed)
PD cath dressing change done per order.   Dayna Ramus

## 2021-06-03 NOTE — Progress Notes (Addendum)
PROGRESS NOTE   Subjective/Complaints: C/o insomnia again last night. Did not take Ambien since he did not find it helpful the night before. Lunesta not on formulary  ROS:  Pt denies SOB, abd pain, CP, N/V/C/D, and vision changes But sleepy     Objective:   No results found. Recent Labs    06/01/21 1419 06/02/21 1325  WBC 4.6 4.5  HGB 8.4* 9.1*  HCT 27.1* 29.0*  PLT 160 162    Recent Labs    06/01/21 1419 06/02/21 1325  NA 129* 132*  K 3.9 3.7  CL 94* 97*  CO2 23 24  GLUCOSE 144* 117*  BUN 71* 38*  CREATININE 6.36* 4.80*  CALCIUM 8.8* 8.8*     Intake/Output Summary (Last 24 hours) at 06/03/2021 1255 Last data filed at 06/03/2021 0700 Gross per 24 hour  Intake 600 ml  Output 1800 ml  Net -1200 ml     Pressure Injury 05/18/21 Heel Right Stage 2 -  Partial thickness loss of dermis presenting as a shallow open injury with a red, pink wound bed without slough. small area on heel, covered with foam (Active)  05/18/21 2200  Location: Heel  Location Orientation: Right  Staging: Stage 2 -  Partial thickness loss of dermis presenting as a shallow open injury with a red, pink wound bed without slough.  Wound Description (Comments): small area on heel, covered with foam  Present on Admission: Yes    Physical Exam: Vital Signs Blood pressure 103/63, pulse 79, temperature 97.7 F (36.5 C), temperature source Oral, resp. rate 18, height 5\' 11"  (1.803 m), weight 83 kg, SpO2 99 %. Gen: no distress, normal appearing HEENT: oral mucosa pink and moist, NCAT Cardio: Reg rate Chest: normal effort, normal rate of breathing Abd: soft, non-distended Ext: no edema Psych: pleasant, normal affect Musculoskeletal:    Cervical back: Normal range of motion and neck supple.    Comments: Right heel boggy and red. R foot- Charcot food- has no arch- no actual skin broken down- scabs R calf scattered  Skin has foam  on it on back of L posterior thigh Also assessed L BKA today- blister that already had is actually deflated in the middle of L BKA- some localized erythema, likely due to sutures and staples- Also has B/L stage II inner buttocks pressure ulcers- which are new- about size of quarter or slightly bigger- abraded and some serous drainage noted.  L BKA has shrinker on it- C/D/I with limb guard in place- looks good/shaping.   L BKA is shaping great- has staples intact- some dried blood noted- shrinker sticking to staples, however no active drainage seen- a little dried serous yellow spots, but no erythema- mild edema.  UE 5/5 B/L in biceps, triceps, WE< grip and FA Les- RLE- HF 4+/5, KF/KE 4/5, DF and PF 4+/5 LLE- HF and KE at least 3+/5- no pain with movement today  Shaping OK- has shrinker on with limb guard over it- C/D/I Skin: L BKA as above.     Comments: IV R forearm LUE fistula-  Scattered scabs on R calf/shin Fallen arch on R foot/charcot foot R heel boggy -  slightly less Neurological:    Mental Status: He is alert and oriented to person, place, and time.    Comments: Decreased to absent Sensation to light touch from R knee downwards  RUE twitching occ.    Assessment/Plan: 1. Functional deficits which require 3+ hours per day of interdisciplinary therapy in a comprehensive inpatient rehab setting. Physiatrist is providing close team supervision and 24 hour management of active medical problems listed below. Physiatrist and rehab team continue to assess barriers to discharge/monitor patient progress toward functional and medical goals  Care Tool:  Bathing    Body parts bathed by patient: Right arm, Chest, Left arm, Abdomen, Front perineal area, Face   Body parts bathed by helper: Right lower leg Body parts n/a: Left lower leg   Bathing assist Assist Level: Moderate Assistance - Patient 50 - 74%     Upper Body Dressing/Undressing Upper body dressing   What is the patient  wearing?: Pull over shirt    Upper body assist Assist Level: Minimal Assistance - Patient > 75%    Lower Body Dressing/Undressing Lower body dressing      What is the patient wearing?: Pants, Underwear/pull up     Lower body assist Assist for lower body dressing: Maximal Assistance - Patient 25 - 49%     Toileting Toileting    Toileting assist Assist for toileting: Maximal Assistance - Patient 25 - 49%     Transfers Chair/bed transfer  Transfers assist     Chair/bed transfer assist level: Moderate Assistance - Patient 50 - 74% (lateral scoot transfer) Chair/bed transfer assistive device: Programmer, multimedia   Ambulation assist   Ambulation activity did not occur: Safety/medical concerns  Assist level: Minimal Assistance - Patient > 75% Assistive device: Parallel bars Max distance: 53ft   Walk 10 feet activity   Assist  Walk 10 feet activity did not occur: Safety/medical concerns        Walk 50 feet activity   Assist Walk 50 feet with 2 turns activity did not occur: Safety/medical concerns         Walk 150 feet activity   Assist Walk 150 feet activity did not occur: Safety/medical concerns         Walk 10 feet on uneven surface  activity   Assist Walk 10 feet on uneven surfaces activity did not occur: Safety/medical concerns         Wheelchair     Assist Will patient use wheelchair at discharge?: Yes Type of Wheelchair: Manual    Wheelchair assist level: Supervision/Verbal cueing Max wheelchair distance: 100'    Wheelchair 50 feet with 2 turns activity    Assist        Assist Level: Supervision/Verbal cueing   Wheelchair 150 feet activity     Assist      Assist Level: Maximal Assistance - Patient 25 - 49%   Blood pressure 103/63, pulse 79, temperature 97.7 F (36.5 C), temperature source Oral, resp. rate 18, height 5\' 11"  (1.803 m), weight 83 kg, SpO2 99 %.  Medical Problem List and Plan: 1.   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  Continue [name] and [name] 2.  Impaired mobility -DVT/anticoagulation:  Pharmaceutical: Continue Heparin             -antiplatelet therapy: ASA/Plavix 3. Pain Management: Change dilaudid to oxycodone prn.             --resume gabapentin to help manage phantom pain.  7/16- reduce Oxy to 2.5 to 5 mg q4 hours prn for pain and ocn't tramadol as well  7/17- pain still an issue- nerve pain and residual limb- on max dose of Gabapentin- will ask therapy to do mirror therapy.  7/18 no pain but very groggy and having tremors , reduce gabapentin to 400mg    7/20- pain doing OK even with reduction in gabapentin to 200 mg QHS- con't regimen  7/26- doing better with lower dose gabapentin- will add Skelaxin- for muscle spasms- and d/c robaxin; and have therapy have him do hip extension!  7/29- will increase gabapentin to 300 mg QHS since helped sleep some- since sleeping so poorly.   7/30: continue current regimen 4. Mood: LCSW to follow for evaluation and support.               -antipsychotic agents: N/A 5. Neuropsych: This patient is capable of making decisions on his own behalf. 6. Skin/Wound Care: Continue wound VAC             --On Zinc and Juven to help promote wound healing.  7/16- remove VAC- is due today-   7/17- has 1 blister, but looks OK- will monitor  7/21- skin looks better- dried blood- no erythema seen- con't shrinker- shaping w  7/22- had foam added for rubbing raw on back of posterior L thigh- will monitor  7/25- Has new B/L inner buttocks wounds/pressure ulcers- uncomfortable- asked nursing to turn him to get off backside. 7/27- stays on his back most of the time- asked him to stay off backside unless eating, but sleeping on back.    7/28- will reassess wounds  when in bed/not eating.   7/29- keep seeing sitting up in w/c- cannot assess wounds 7. Fluids/Electrolytes/Nutrition: Strict I/O with daily weights. 8. R -charcot foot: Encourage wearing CAM boot with activity. 9. T2DM: Hgb A1C-8.2. Monitor BS ac/hs             --continue Lantus 5 units with SSI for elevated BS  CBG (last 3)  Recent Labs    06/03/21 0604 06/03/21 0635 06/03/21 1209  GLUCAP 51* 87 99   7/30: hypoglycemic to 51: decrease Lantus to 6U 10. ESRD: HD MWF at the end of the day to facilitate therapy and endurance issues             --No plans to resume PD. Midodrine for BP support in hemo.  7/20- K+ 5.2 today- per renal- also got approval from renal to increase Flomax 7/21- Per renal for K+ 5.2.  Managed by nephro 7/25- Na 129- per renal 7/29- Na up to 132- con't per renal 11. CAD w/recent NSTEMI: Treated medically w/ASA, Plavix and Lipitor.   12. Acute on chronic anemia: Loaded with IV ferric gluconate --continue [name] 150 mc weekly with serial CBC to monitor for stability . 13. PAD: On trental, plavix and ASA. 14. Chronic constipation: Continue Sorbitol bid.  7/16- LBM Thursday- gave an addiitonal dose of Sorbitol since cannot give Mg citrate  7/17- LBM overnight- was hard- will con't Sorbitol BID- might need more as time goes on.   7/19- will add Linzess 145 mcg daily- starting  tomorrow  7/20- will add Senokot 1 tab BID and con't miralax BID and Sorbitol BID for now- goal to d/c Sorbitol- con't Linzess- just started this AM  7/21- Had good BM, however still hard- change senokot to senokot-S for stool softener benefits.   7/28- LBM yesterday however pt insists it was Monday- asked for more Sorbitol- will increase Senokot to 2 tabs BID and add Miralax daily/increase Linzess to 290 mcg daily and stop scheduled Sorbitol.  15. Hyperparathyroidism: Being managed with aruxia and Calcitriol.  16. Insomnia/RLS: Resume gabapentin for phantom pain as well as RLS.              --Klonopin prn for sleep  7/17- cannot use trazodone for sleep- causes urinary retention/makes it worse- will d/c prn trazodone- con't klonopin and add Melatonin- pt said not real helpful, but maybe with klonpin will help. .   7/19- slept poorly- will try Ambien 5 mg QHS- but no higher. Will monitor  7/20- much improved with Ambien- con't regimen  7/21- will increase Ambien to 10 mg QHS- Ok'd by pharmacy- and add Seroquel 25 mg QHS PRN since "mind racing"- cannot do Elavil?Trazodone due ot urinary retention.   7/22- slept much better with Ambien- 10 mg QHS- no klonopin needed and didn't take seroquel- has seroquel prn.   7/26- will change seroquel to scheduled and see how it goes for sleep.   7/27- will schedule Seroquel 50 mg QHS and ask that nothing be given by midnight for sleep. Since too sedated this AM- held AM meds until wakes up.   7/28- will change to 8pm so might sleep earlier.   7/29- poor sleep but slept almost 4 hours in HD- will increase gabapentin to 300 mg QHS and if that doesn't work, suggest Physiological scientist.   7/30: Lunesta not on formulary. Increase seroquel to 100mg .  17 H/o  Depression/anxiety: Continue Lexapro (started a few weeks ago) and klonopin.  7/16- will decrease Klonopin to 0.25 mg BID prn since too sleepy/dazed this AM  7/17- d/c lexapro per pt request.  18. Cough 7/16- will check CXR-  shows likely atelectasis since WBC is normal and afebrile.      19. Restart meds for hyperparathyroidism-  87. Oliguria/Urinary retention  7/17- required cath for 500cc overnight; will ask to bladder scan q8 hours- usually voided 1-2x/day per pt. Don't want him getting UTI. 7/20- will increase Flomax to 0.8 mg nightly- still requiring caths.  7/21- no change yet- con't regimen     7/22- will change cath to qday at 8pm daily.  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  7/26- will try  letting pt not be cathed today and see if increased volume helps him.  If doesn't work- will have pt learn cathing- don't want to place foley.   7/27- 375cc scan this AM- will let get to 600cc max and then will need to cath, no matter- what  7/28- didn't void even with 700cc in bladder- will need to teach cathing.  21. B/L Stage II inner buttock pressure ulcers  7/25- will con't local wound care- and monitor- turn to get off backside when in bed- only sit up when eating.   LOS: 16 days A FACE TO FACE EVALUATION WAS PERFORMED  Maxwell Harmon 06/03/2021, 12:55 PM

## 2021-06-03 NOTE — Progress Notes (Signed)
Maxwell Harmon Progress Note   Outpatient orders: [facility], MWF, F180, 3hrs, 2k, 2.5cal, 137 na, 35bicarb, 400bfr, 16g, edw 86.5kg. hep 2k bolus  Assessment/ Plan:   ESRD: has been on HD due to issues with CCPD: inadequate drainage/UF (catheter malfunction?)  - no plans for further PD and is being scheduled to have PD cath removed as an outpatient, therefore no PD cath flushing needed.  Dressing to be changed daily - RN will facilitate;  -HD MWF, able to net UF 4L on 7/25, 3L 7/28, 1L on 7/29.   -7/13 AVF infiltration thought to be due to arm moved - no further issues.  - Will try to get a floor scale weight with assistance; he has fluid that we need to UF as he's above pre amputation weight -> today's weight was 83kg. Will trend from here and see if that ~weight holds as it's more consistent with having a recent amputation.   Cellulitis/diabetic foot, progressive gangrene Left transtibial amputation  7/8. [name]. [name]   Volume/ hypertension: EDW 86.5kg pre amputation. Weight this AM 83 kg (new EDW?)  - push UF as able but also hypotensive-  profile-  low temp and midodrine pre HD.   Makes urine-  dosing with lasix to help to get to EDW -  now some issues with urinary retention-   placed on flomax but still req in and out caths   Anemia of Chronic Kidney Disease: Hemoglobin 8.0. No active infection so gave 0.5g IV iron load over 4 HD treatments. On aranesp here. Transfuse prn-  getting labs only on HD   Secondary Hyperparathyroidism/Hyperphosphatemia: stopped auryxia dose with phos running on low side (3.3 on 7/25)    -  also on calcitriol, sensipar- getting labs only with HD - Phos 4.5 on 7/29 -> will continue to monitor for now.   DM2 w/ hyperglycemia -mgmt per primary service   Hypoalbuminemia, protein calorie malnutrition -push protein    Subjective:   Denies f/c/n/v/dyspnea; hypoglycemic episode this AM but feels better now.   Objective:   BP 103/63 (BP  Location: Right Arm)   Pulse 79   Temp 97.7 F (36.5 C) (Oral)   Resp 18   Ht 5\' 11"  (1.803 m)   Wt 83 kg   SpO2 99%   BMI 25.52 kg/m   Intake/Output Summary (Last 24 hours) at 06/03/2021 0738 Last data filed at 06/02/2021 2240 Gross per 24 hour  Intake 600 ml  Output 1800 ml  Net -1200 ml   Weight change: -0.6 kg  Physical Gen:nad, nontoxic appearing CVS:rrr Resp:normal wob on RA  OQH:UTMLY RLE 1+ edema, LLE amputated with immobilizer on Neuro: somnolent but arousable Access: lt BCF  +b/t, swollen but no active extravasation.  Imaging: No results found.  Labs: BMET Recent Labs  Lab 05/29/21 1445 06/01/21 1419 06/02/21 1325  NA 129* 129* 132*  K 4.7 3.9 3.7  CL 93* 94* 97*  CO2 25 23 24   GLUCOSE 172* 144* 117*  BUN 84* 71* 38*  CREATININE 6.08* 6.36* 4.80*  CALCIUM 9.5 8.8* 8.8*  PHOS 3.3 4.5 3.3   CBC Recent Labs  Lab 05/29/21 1445 06/01/21 1419 06/02/21 1325  WBC 5.8 4.6 4.5  HGB 8.6* 8.4* 9.1*  HCT 27.3* 27.1* 29.0*  MCV 104.2* 105.9* 105.1*  PLT 172 160 162    Medications:     aspirin EC  81 mg Oral Daily   atorvastatin  80 mg Oral Daily   calcitRIOL  0.25 mcg  Oral BID   Chlorhexidine Gluconate Cloth  6 each Topical Q0600   Chlorhexidine Gluconate Cloth  6 each Topical Q0600   cinacalcet  30 mg Oral Q Mon   clopidogrel  75 mg Oral Daily   [START ON 06/07/2021] darbepoetin ([name]) injection - DIALYSIS  150 mcg Intravenous Q Wed-HD   feeding supplement (NEPRO CARB STEADY)  237 mL Oral QHS   furosemide  80 mg Oral BID   gabapentin  300 mg Oral QHS   gentamicin cream   Topical Daily   heparin  5,000 Units Subcutaneous Q8H   insulin aspart  0-6 Units Subcutaneous TID WC   insulin glargine  7 Units Subcutaneous QHS   linaclotide  290 mcg Oral QAC breakfast   midodrine  10 mg Oral Q M,W,F-HD   multivitamin  1 tablet Oral QHS   nutrition supplement (JUVEN)  1 packet Oral BID BM   pantoprazole  40 mg Oral Daily   pentoxifylline  400 mg Oral  Q breakfast   polyethylene glycol  17 g Oral Daily   QUEtiapine  50 mg Oral QHS   senna-docusate  2 tablet Oral BID   tamsulosin  0.8 mg Oral QHS   zolpidem  10 mg Oral QHS      Otelia Santee, MD 06/03/2021, 7:38 AM

## 2021-06-03 NOTE — Progress Notes (Signed)
Physical Therapy Session Note  Patient Details  Name: Maxwell Harmon MRN: [id number] Date of Birth: 14-May-1956  Today's Date: 06/03/2021 PT Individual Time: 1305-1405 PT Individual Time Calculation (min): 60 min   Short Term Goals: Week 2:  PT Short Term Goal 1 (Week 2): =LTG due to ELOS  Skilled Therapeutic Interventions/Progress Updates:    Pt received sitting in w/c with his wife, Angie, present for hands-on education/training and pt agreeable to therapy session. Pt wearing L LE shrinker and limb protector. Therapist reiterated education from primary PT regarding recommendation that pt D/C home at wheelchair level performing squat pivot vs stand pivot transfers depending on pt's energy levels. Pt/wife in agreement with this plan. Pt/wife report main concern is car transfer. Notified RN and then transported pt to/from main entrance.  Performed real life car transfer (pt's wife's Jeep) using slide board x3 trials. Therapist provided education on proper positioning and set-up of wheelchair along with proper positioning of slide board. Pt completed these transfers with CGA from therapist and then from his wife with pt/wife demonstrating proper technique and good carryover. Pt/wife report much improved confidence with this task. Therapist educated them on recommendation to perform slide board transfers because pt may be fatigued after dialysis and this is the safest transfer technique - pt/family in agreement.  Transported back up to [name]. Discussed bed transfers (25" height) and performed slide board transfer w/c<>ADL apartment bed (28" height) with CGA for safety and cuing for proper transfer board positioning. Sit<>supine on ADL bed with supervision.   Educated pt on importance of maintaining full L knee extension ROM and full L hip extension ROM (demonstrated how to perform L hip flexor stretch in R sidelying as pt reports not tolerating prone positioning).  Pt left sitting in w/c with his wife  present as hand-off to [place], [place].  Therapy Documentation Precautions:  Precautions Precautions: Fall Precaution Comments: charcot foot, wound VAC, LLE NWB Required Braces or Orthoses: Other Brace Other Brace: Limb Guard Restrictions Weight Bearing Restrictions: Yes LLE Weight Bearing: Non weight bearing Other Position/Activity Restrictions: limb protector for mobility   Pain:  No reports of pain throughout session.   Therapy/Group: Individual Therapy  Tawana Scale , PT, DPT, NCS, CSRS 06/03/2021, 12:52 PM

## 2021-06-03 NOTE — Significant Event (Signed)
Hypoglycemic Event  CBG: 51  Treatment: 4 oz juice/soda  Symptoms: None  Follow-up CBG: Time: 1173 CBG Result:87  Possible Reasons for Event: Unknown  Comments/MD notified:    Celesta Aver

## 2021-06-03 NOTE — Progress Notes (Signed)
Occupational Therapy Session Note  Patient Details  Name: Maxwell Harmon MRN: [id number] Date of Birth: Mar 21, 1956  Today's Date: 06/03/2021 OT Individual Time: 2446-2863 OT Individual Time Calculation (min): 40 min    Short Term Goals: Week 3:  OT Short Term Goal 1 (Week 3): STGs = LTGs at Supervision  Skilled Therapeutic Interventions/Progress Updates:    Pt greeted handoff to OT from PT session in therapy apartment. Discussed home bathroom set-up and practiced tub bench transfer using squat-pivot with min A from caregiver. OT educated on proper body mechanics and use of gait belt. Simulated drop arm BSC transfers, toileting, and clothing management. OT educated on residual limb care including use of inspection mirror for skin checks. Practiced donning/doffing shrinker sock using 4 hands approach. Pt transferred back to bed at end of session with min A squat-pivot. Pt left semi-reclined in bed with bed alarm on, call bell in reach, and needs met.   Therapy Documentation Precautions:  Precautions Precautions: Fall Precaution Comments: charcot foot, wound VAC, LLE NWB Required Braces or Orthoses: Other Brace Other Brace: Limb Guard Restrictions Weight Bearing Restrictions: Yes LLE Weight Bearing: Non weight bearing Other Position/Activity Restrictions: limb protector for mobility Pain:  Denies pain   Therapy/Group: Individual Therapy  Valma Cava 06/03/2021, 2:47 PM

## 2021-06-04 LAB — GLUCOSE, CAPILLARY
Glucose-Capillary: 99 mg/dL (ref 70–99)
Glucose-Capillary: 93 mg/dL (ref 70–99)
Glucose-Capillary: 102 mg/dL — ABNORMAL HIGH (ref 70–99)
Glucose-Capillary: 181 mg/dL — ABNORMAL HIGH (ref 70–99)

## 2021-06-04 MED ORDER — QUETIAPINE FUMARATE 50 MG PO TABS
50.0000 mg | ORAL_TABLET | Freq: Every day | ORAL | Status: DC
  Administered 2021-06-04: 50 mg via ORAL
  Filled 2021-06-04: qty 1

## 2021-06-04 MED ORDER — OXYCODONE-ACETAMINOPHEN 5-325 MG PO TABS
1.0000 | ORAL_TABLET | Freq: Every day | ORAL | Status: DC
  Administered 2021-06-04 – 2021-06-05 (×2): 1 via ORAL
  Filled 2021-06-04 (×2): qty 1

## 2021-06-04 NOTE — Progress Notes (Signed)
[facility] Progress Note   Outpatient orders: [place], MWF, F180, 3hrs, 2k, 2.5cal, 137 na, 35bicarb, 400bfr, 16g, edw 86.5kg. hep 2k bolus  Assessment/ Plan:   ESRD: has been on HD due to issues with CCPD: inadequate drainage/UF (catheter malfunction?)  - no plans for further PD and is being scheduled to have PD cath removed as an outpatient, therefore no PD cath flushing needed.  Dressing to be changed daily - RN will facilitate;  -HD MWF, able to net UF 4L on 7/25, 3L 7/28, 1L on 7/29. Next HD Mon.  -7/13 AVF infiltration thought to be due to arm moved - no further issues.  - Will try to get a floor scale weight with assistance; he has fluid that we need to UF as he's above pre amputation weight -> yesterday's weight was 83kg and now 90kg. I don't believe the weight from 7/30 -> will UF as tolerated tomorrow and try to get a good preHD weight at the unit tomorrow.    Cellulitis/diabetic foot, progressive gangrene Left transtibial amputation  7/8. [name]. [name]   Volume/ hypertension: EDW 86.5kg pre amputation. Weight 7/30  AM 83 kg (doesn't appear to be real as today's weight is 90kg)  - push UF as able but also hypotensive-  profile-  low temp and midodrine pre HD.   Makes urine-  dosing with lasix to help to get to EDW -  now some issues with urinary retention-   placed on flomax but still req in and out caths   Anemia of Chronic Kidney Disease: Hemoglobin 8.0. No active infection so gave 0.5g IV iron load over 4 HD treatments. On aranesp here. Transfuse prn-  getting labs only on HD. Darbo 150 (7/28)   Secondary Hyperparathyroidism/Hyperphosphatemia: stopped auryxia dose with phos running on low side (3.3 on 7/25)    -  also on calcitriol, sensipar- getting labs only with HD - Phos 4.5 on 7/29 -> 3.3 7/30. Will continue to monitor for now and recheck mid week.   DM2 w/ hyperglycemia -mgmt per primary service   Hypoalbuminemia, protein calorie  malnutrition -push protein    Subjective:   Denies f/c/n/v/dyspnea; slept much better last night.   Objective:   BP 117/66 (BP Location: Right Arm)   Pulse 74   Temp 98.2 F (36.8 C)   Resp 18   Ht 5\' 11"  (1.803 m)   Wt 89.9 kg   SpO2 95%   BMI 27.64 kg/m   Intake/Output Summary (Last 24 hours) at 06/04/2021 9924 Last data filed at 06/03/2021 1812 Gross per 24 hour  Intake 600 ml  Output --  Net 600 ml   Weight change: 0.7 kg  Physical Gen:nad, nontoxic appearing CVS:rrr Resp:normal wob on RA  QAS:TMHDQ RLE 1+ edema, LLE amputated with immobilizer on Neuro: somnolent but arousable Access: lt BCF  +b/t, swollen but no active extravasation.  Imaging: No results found.  Labs: BMET Recent Labs  Lab 05/29/21 1445 06/01/21 1419 06/02/21 1325  NA 129* 129* 132*  K 4.7 3.9 3.7  CL 93* 94* 97*  CO2 25 23 24   GLUCOSE 172* 144* 117*  BUN 84* 71* 38*  CREATININE 6.08* 6.36* 4.80*  CALCIUM 9.5 8.8* 8.8*  PHOS 3.3 4.5 3.3   CBC Recent Labs  Lab 05/29/21 1445 06/01/21 1419 06/02/21 1325  WBC 5.8 4.6 4.5  HGB 8.6* 8.4* 9.1*  HCT 27.3* 27.1* 29.0*  MCV 104.2* 105.9* 105.1*  PLT 172 160 162    Medications:  aspirin EC  81 mg Oral Daily   atorvastatin  80 mg Oral Daily   calcitRIOL  0.25 mcg Oral BID   Chlorhexidine Gluconate Cloth  6 each Topical Q0600   Chlorhexidine Gluconate Cloth  6 each Topical Q0600   cinacalcet  30 mg Oral Q Mon   clopidogrel  75 mg Oral Daily   [START ON 06/07/2021] darbepoetin ([name]) injection - DIALYSIS  150 mcg Intravenous Q Wed-HD   feeding supplement (NEPRO CARB STEADY)  237 mL Oral QHS   furosemide  80 mg Oral BID   gabapentin  300 mg Oral QHS   gentamicin cream   Topical Daily   heparin  5,000 Units Subcutaneous Q8H   insulin aspart  0-6 Units Subcutaneous TID WC   insulin glargine  6 Units Subcutaneous QHS   linaclotide  290 mcg Oral QAC breakfast   midodrine  10 mg Oral Q M,W,F-HD   multivitamin  1 tablet Oral  QHS   nutrition supplement (JUVEN)  1 packet Oral BID BM   pantoprazole  40 mg Oral Daily   pentoxifylline  400 mg Oral Q breakfast   polyethylene glycol  17 g Oral Daily   QUEtiapine  100 mg Oral QHS   senna-docusate  2 tablet Oral BID   tamsulosin  0.8 mg Oral QHS      Otelia Santee, MD 06/04/2021, 7:37 AM

## 2021-06-04 NOTE — Progress Notes (Signed)
PROGRESS NOTE   Subjective/Complaints: Slept better last night and he thinks this was due to taking an oxycodone at midnight- he does find it makes him too groggy in the morning though. Discussed scheduling 5mg  at 8pm and reducing seroquel back to 50mg .   ROS:  Pt denies SOB, abd pain, CP, N/V/C/D, and vision changes, insomnia improved  Objective:   No results found. Recent Labs    06/02/21 1325  WBC 4.5  HGB 9.1*  HCT 29.0*  PLT 162    Recent Labs    06/02/21 1325  NA 132*  K 3.7  CL 97*  CO2 24  GLUCOSE 117*  BUN 38*  CREATININE 4.80*  CALCIUM 8.8*     Intake/Output Summary (Last 24 hours) at 06/04/2021 1627 Last data filed at 06/04/2021 1350 Gross per 24 hour  Intake 720 ml  Output --  Net 720 ml     Pressure Injury 05/18/21 Heel Right Stage 2 -  Partial thickness loss of dermis presenting as a shallow open injury with a red, pink wound bed without slough. small area on heel, covered with foam (Active)  05/18/21 2200  Location: Heel  Location Orientation: Right  Staging: Stage 2 -  Partial thickness loss of dermis presenting as a shallow open injury with a red, pink wound bed without slough.  Wound Description (Comments): small area on heel, covered with foam  Present on Admission: Yes    Physical Exam: Vital Signs Blood pressure 139/78, pulse 77, temperature 98.4 F (36.9 C), resp. rate 16, height 5\' 11"  (1.803 m), weight 89.9 kg, SpO2 100 %. Gen: no distress, normal appearing HEENT: oral mucosa pink and moist, NCAT Cardio: Reg rate Chest: normal effort, normal rate of breathing Abd: soft, non-distended Ext: LUE swollen and warm near fistula site Psych: pleasant, normal affect Musculoskeletal:    Cervical back: Normal range of motion and neck supple.    Comments: Right heel boggy and red. R foot- Charcot food- has no arch- no actual skin broken down- scabs R calf scattered  Skin has  foam on it on back of L posterior thigh Also assessed L BKA today- blister that already had is actually deflated in the middle of L BKA- some localized erythema, likely due to sutures and staples- Also has B/L stage II inner buttocks pressure ulcers- which are new- about size of quarter or slightly bigger- abraded and some serous drainage noted.  L BKA has shrinker on it- C/D/I with limb guard in place- looks good/shaping.   L BKA is shaping great- has staples intact- some dried blood noted- shrinker sticking to staples, however no active drainage seen- a little dried serous yellow spots, but no erythema- mild edema.  UE 5/5 B/L in biceps, triceps, WE< grip and FA Les- RLE- HF 4+/5, KF/KE 4/5, DF and PF 4+/5 LLE- HF and KE at least 3+/5- no pain with movement today  Shaping OK- has shrinker on with limb guard over it- C/D/I Skin: L BKA as above.     Comments: IV R forearm LUE fistula-  Scattered scabs on R calf/shin Fallen arch on R foot/charcot foot R heel boggy -  slightly less Neurological:    Mental Status: He is alert and oriented to person, place, and time.    Comments: Decreased to absent Sensation to light touch from R knee downwards  RUE twitching occ.    Assessment/Plan: 1. Functional deficits which require 3+ hours per day of interdisciplinary therapy in a comprehensive inpatient rehab setting. Physiatrist is providing close team supervision and 24 hour management of active medical problems listed below. Physiatrist and rehab team continue to assess barriers to discharge/monitor patient progress toward functional and medical goals  Care Tool:  Bathing    Body parts bathed by patient: Right arm, Chest, Left arm, Abdomen, Front perineal area, Face   Body parts bathed by helper: Right lower leg Body parts n/a: Left lower leg   Bathing assist Assist Level: Moderate Assistance - Patient 50 - 74%     Upper Body Dressing/Undressing Upper body dressing   What is the patient  wearing?: Pull over shirt    Upper body assist Assist Level: Minimal Assistance - Patient > 75%    Lower Body Dressing/Undressing Lower body dressing      What is the patient wearing?: Pants, Underwear/pull up     Lower body assist Assist for lower body dressing: Maximal Assistance - Patient 25 - 49%     Toileting Toileting    Toileting assist Assist for toileting: Maximal Assistance - Patient 25 - 49%     Transfers Chair/bed transfer  Transfers assist     Chair/bed transfer assist level: Contact Guard/Touching assist Chair/bed transfer assistive device: Sliding board   Locomotion Ambulation   Ambulation assist   Ambulation activity did not occur: Safety/medical concerns  Assist level: Minimal Assistance - Patient > 75% Assistive device: Parallel bars Max distance: 97ft   Walk 10 feet activity   Assist  Walk 10 feet activity did not occur: Safety/medical concerns        Walk 50 feet activity   Assist Walk 50 feet with 2 turns activity did not occur: Safety/medical concerns         Walk 150 feet activity   Assist Walk 150 feet activity did not occur: Safety/medical concerns         Walk 10 feet on uneven surface  activity   Assist Walk 10 feet on uneven surfaces activity did not occur: Safety/medical concerns         Wheelchair     Assist Will patient use wheelchair at discharge?: Yes Type of Wheelchair: Manual    Wheelchair assist level: Supervision/Verbal cueing Max wheelchair distance: 100'    Wheelchair 50 feet with 2 turns activity    Assist        Assist Level: Supervision/Verbal cueing   Wheelchair 150 feet activity     Assist      Assist Level: Maximal Assistance - Patient 25 - 49%   Blood pressure 139/78, pulse 77, temperature 98.4 F (36.9 C), resp. rate 16, height 5\' 11"  (1.803 m), weight 89.9 kg, SpO2 100 %.  Medical Problem List and Plan: 1.  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  Continue [name] and [name] 2.  Impaired  mobility -DVT/anticoagulation:  Pharmaceutical: Continue Heparin             -antiplatelet therapy: ASA/Plavix 3. Pain Management: Change dilaudid to oxycodone prn.             --resume gabapentin to help manage phantom pain.  7/16- reduce Oxy to 2.5 to 5 mg q4 hours prn for pain and ocn't tramadol as well  7/17- pain still an issue- nerve pain and residual limb- on max dose of Gabapentin- will ask therapy to do mirror therapy.  7/18 no pain but very groggy and having tremors , reduce gabapentin to 400mg    7/20- pain doing OK even with reduction in gabapentin to 200 mg QHS- con't regimen  7/26- doing better with lower dose gabapentin- will add Skelaxin- for muscle spasms- and d/c robaxin; and have therapy have him do hip extension!  7/29- will increase gabapentin to 300 mg QHS since helped sleep some- since sleeping so poorly.   7/30: continue current regimen 4. Mood: LCSW to follow for evaluation and support.               -antipsychotic agents: N/A 5. Neuropsych: This patient is capable of making decisions on his own behalf. 6. Skin/Wound Care: Continue wound VAC             --On Zinc and Juven to help promote wound healing.  7/16- remove VAC- is due today-   7/17- has 1 blister, but looks OK- will monitor  7/21- skin looks better- dried blood- no erythema seen- con't shrinker- shaping w  7/22- had foam added for rubbing raw on back of posterior L thigh- will monitor  7/25- Has new B/L inner buttocks wounds/pressure ulcers- uncomfortable- asked nursing to turn him to get off backside. 7/27- stays on his back most of the time- asked him to stay off backside unless eating, but sleeping on back.    7/28- will reassess wounds when in bed/not eating.   7/29- keep seeing  sitting up in w/c- cannot assess wounds 7. Fluids/Electrolytes/Nutrition: Strict I/O with daily weights. 8. R -charcot foot: Encourage wearing CAM boot with activity. 9. T2DM: Hgb A1C-8.2. Monitor BS ac/hs             --continue Lantus 5 units with SSI for elevated BS  CBG (last 3)  Recent Labs    06/03/21 1649 06/03/21 2102 06/04/21 1108  GLUCAP 154* 173* 93   7/30: hypoglycemic to 51: decrease Lantus to 6U 10. ESRD: HD MWF at the end of the day to facilitate therapy and endurance issues             --No plans to resume PD. Midodrine for BP support in hemo.  7/20- K+ 5.2 today- per renal- also got approval from renal to increase Flomax 7/21- Per renal for K+ 5.2.  Managed by nephro 7/25- Na 129- per renal 7/29- Na up to 132- con't per renal 11. CAD w/recent NSTEMI: Treated medically w/ASA, Plavix and Lipitor.   12. Acute on chronic anemia: Loaded with IV ferric gluconate --continue [name] 150 mc weekly with serial CBC to monitor for stability . 13. PAD: On trental, plavix and ASA. 14. Chronic constipation: Continue Sorbitol bid.  7/16- LBM Thursday- gave an addiitonal dose of Sorbitol since cannot give Mg citrate  7/17- LBM overnight- was hard- will con't Sorbitol BID- might need more as time goes on.   7/19- will add Linzess 145 mcg daily- starting tomorrow  7/20- will add Senokot 1 tab BID  and con't miralax BID and Sorbitol BID for now- goal to d/c Sorbitol- con't Linzess- just started this AM  7/21- Had good BM, however still hard- change senokot to senokot-S for stool softener benefits.   7/28- LBM yesterday however pt insists it was Monday- asked for more Sorbitol- will increase Senokot to 2 tabs BID and add Miralax daily/increase Linzess to 290 mcg daily and stop scheduled Sorbitol.  15. Hyperparathyroidism: Being managed with aruxia and Calcitriol.  16. Insomnia/RLS: Resume gabapentin for phantom pain as well as RLS.             --Klonopin prn for sleep  7/17- cannot use  trazodone for sleep- causes urinary retention/makes it worse- will d/c prn trazodone- con't klonopin and add Melatonin- pt said not real helpful, but maybe with klonpin will help. .   7/19- slept poorly- will try Ambien 5 mg QHS- but no higher. Will monitor  7/20- much improved with Ambien- con't regimen  7/21- will increase Ambien to 10 mg QHS- Ok'd by pharmacy- and add Seroquel 25 mg QHS PRN since "mind racing"- cannot do Elavil?Trazodone due ot urinary retention.   7/22- slept much better with Ambien- 10 mg QHS- no klonopin needed and didn't take seroquel- has seroquel prn.   7/26- will change seroquel to scheduled and see how it goes for sleep.   7/27- will schedule Seroquel 50 mg QHS and ask that nothing be given by midnight for sleep. Since too sedated this AM- held AM meds until wakes up.   7/28- will change to 8pm so might sleep earlier.   7/29- poor sleep but slept almost 4 hours in HD- will increase gabapentin to 300 mg QHS and if that doesn't work, suggest Physiological scientist.   7/30: Lunesta not on formulary. Increase seroquel to 100mg .   7/31: slept better last night but believes this was after he took an oxycodone at midnight- feels it makes him too groggy in the AM though. Discussed decreasing seroquel back to 50mg  and scheduling 5mg  oxycodone at 8pm and he is agreeable.  37 H/o  Depression/anxiety: Continue Lexapro (started a few weeks ago) and klonopin.  7/16- will decrease Klonopin to 0.25 mg BID prn since too sleepy/dazed this AM  7/17- d/c lexapro per pt request.  18. Cough 7/16- will check CXR-  shows likely atelectasis since WBC is normal and afebrile.      19. Restart meds for hyperparathyroidism-  83. Oliguria/Urinary retention  7/17- required cath for 500cc overnight; will ask to bladder scan q8 hours- usually voided 1-2x/day per pt. Don't want him getting UTI. 7/20- will increase Flomax to 0.8 mg nightly- still requiring caths.  7/21- no change yet- con't regimen     7/22- will  change cath to qday at 8pm daily.  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  7/26- will try letting pt not be cathed today and see if increased volume helps him.  If doesn't work- will have pt learn cathing- don't want to place foley.   7/27- 375cc scan this AM- will let get to 600cc max and then will need to cath, no matter- what  7/28- didn't void even with 700cc in bladder- will need to teach cathing.  21. B/L Stage II inner buttock pressure ulcers  7/31: will continue local wound care- and monitor- turn to get off backside when in bed- only sit up when eating.  22. AVF infiltration:  appreciate nephro eval, continue ice to reduce swelling.   LOS: 17 days A FACE TO FACE EVALUATION WAS PERFORMED  Clide Deutscher Beau Vanduzer 06/04/2021, 4:27 PM

## 2021-06-05 LAB — CBC
HCT: 30 % — ABNORMAL LOW (ref 39.0–52.0)
Hemoglobin: 9.4 g/dL — ABNORMAL LOW (ref 13.0–17.0)
MCH: 33.2 pg (ref 26.0–34.0)
MCHC: 31.3 g/dL (ref 30.0–36.0)
MCV: 106 fL — ABNORMAL HIGH (ref 80.0–100.0)
Platelets: 159 10*3/uL (ref 150–400)
RBC: 2.83 MIL/uL — ABNORMAL LOW (ref 4.22–5.81)
RDW: 19.6 % — ABNORMAL HIGH (ref 11.5–15.5)
WBC: 5.7 10*3/uL (ref 4.0–10.5)
nRBC: 0 % (ref 0.0–0.2)

## 2021-06-05 LAB — GLUCOSE, CAPILLARY
Glucose-Capillary: 127 mg/dL — ABNORMAL HIGH (ref 70–99)
Glucose-Capillary: 115 mg/dL — ABNORMAL HIGH (ref 70–99)
Glucose-Capillary: 62 mg/dL — ABNORMAL LOW (ref 70–99)
Glucose-Capillary: 99 mg/dL (ref 70–99)

## 2021-06-05 LAB — RENAL FUNCTION PANEL
Albumin: 2.6 g/dL — ABNORMAL LOW (ref 3.5–5.0)
Anion gap: 12 (ref 5–15)
BUN: 53 mg/dL — ABNORMAL HIGH (ref 8–23)
CO2: 23 mmol/L (ref 22–32)
Calcium: 8.9 mg/dL (ref 8.9–10.3)
Chloride: 95 mmol/L — ABNORMAL LOW (ref 98–111)
Creatinine, Ser: 6.11 mg/dL — ABNORMAL HIGH (ref 0.61–1.24)
GFR, Estimated: 10 mL/min — ABNORMAL LOW (ref >60)
Glucose, Bld: 158 mg/dL — ABNORMAL HIGH (ref 70–99)
Phosphorus: 4.6 mg/dL (ref 2.5–4.6)
Potassium: 5 mmol/L (ref 3.5–5.1)
Sodium: 130 mmol/L — ABNORMAL LOW (ref 135–145)

## 2021-06-05 MED ORDER — QUETIAPINE FUMARATE 50 MG PO TABS
50.0000 mg | ORAL_TABLET | Freq: Every day | ORAL | Status: DC
  Administered 2021-06-05: 50 mg via ORAL
  Filled 2021-06-05: qty 1

## 2021-06-05 MED ORDER — QUETIAPINE FUMARATE 50 MG PO TABS: 50.0000 mg | ORAL_TABLET | Freq: Every evening | ORAL | Status: DC | PRN

## 2021-06-05 MED ORDER — ALBUMIN HUMAN 25 % IV SOLN
25.0000 g | INTRAVENOUS | Status: DC | PRN
  Filled 2021-06-05: qty 100

## 2021-06-05 MED ORDER — ALTEPLASE 2 MG IJ SOLR: 2.0000 mg | Freq: Once | INTRAMUSCULAR | Status: DC | PRN

## 2021-06-05 MED ORDER — LIDOCAINE-PRILOCAINE 2.5-2.5 % EX CREA: 1.0000 "application " | TOPICAL_CREAM | CUTANEOUS | Status: DC | PRN

## 2021-06-05 MED ORDER — SODIUM CHLORIDE 0.9 % IV SOLN: 100.0000 mL | INTRAVENOUS | Status: DC | PRN

## 2021-06-05 MED ORDER — LIDOCAINE HCL (PF) 1 % IJ SOLN: 5.0000 mL | INTRAMUSCULAR | Status: DC | PRN

## 2021-06-05 MED ORDER — INSULIN GLARGINE 100 UNIT/ML ~~LOC~~ SOLN
5.0000 [IU] | Freq: Every day | SUBCUTANEOUS | Status: DC
  Administered 2021-06-05 – 2021-06-07 (×3): 5 [IU] via SUBCUTANEOUS
  Filled 2021-06-05 (×4): qty 0.05

## 2021-06-05 MED ORDER — HEPARIN SODIUM (PORCINE) 1000 UNIT/ML DIALYSIS: 20.0000 [IU]/kg | INTRAMUSCULAR | Status: DC | PRN

## 2021-06-05 MED ORDER — PENTAFLUOROPROP-TETRAFLUOROETH EX AERO: 1.0000 "application " | INHALATION_SPRAY | CUTANEOUS | Status: DC | PRN

## 2021-06-05 MED ORDER — HEPARIN SODIUM (PORCINE) 1000 UNIT/ML DIALYSIS: 1000.0000 [IU] | INTRAMUSCULAR | Status: DC | PRN

## 2021-06-05 NOTE — Progress Notes (Signed)
PROGRESS NOTE   Subjective/Complaints:  Slept great- knew off Ambien and wasn't sure if took Seroquel- per chart, he did- and also took some pain meds before bed- said he slept yesterday afternoon, woke around 8pm and then back to sleep- said also didn't require Klonopin.  LBM 2 days ago, but has been going daily usually.    ROS:  Pt denies SOB, abd pain, CP, N/V/C/D, and vision changes   Objective:   No results found. No results for input(s): WBC, HGB, HCT, PLT in the last 72 hours.   No results for input(s): NA, K, CL, CO2, GLUCOSE, BUN, CREATININE, CALCIUM in the last 72 hours.    Intake/Output Summary (Last 24 hours) at 06/05/2021 1340 Last data filed at 06/05/2021 7867 Gross per 24 hour  Intake 720 ml  Output 1600 ml  Net -880 ml     Pressure Injury 05/18/21 Heel Right Stage 2 -  Partial thickness loss of dermis presenting as a shallow open injury with a red, pink wound bed without slough. small area on heel, covered with foam (Active)  05/18/21 2200  Location: Heel  Location Orientation: Right  Staging: Stage 2 -  Partial thickness loss of dermis presenting as a shallow open injury with a red, pink wound bed without slough.  Wound Description (Comments): small area on heel, covered with foam  Present on Admission: Yes    Physical Exam: Vital Signs Blood pressure 112/61, pulse 80, temperature 98.2 F (36.8 C), temperature source Oral, resp. rate 18, height 5\' 11"  (1.803 m), weight 90.3 kg, SpO2 94 %.   General: awake, alert, appropriate, sitting up in w/c- OT at side; "very awake/perky". NAD HENT: conjugate gaze; oropharynx moist CV: regular rate; no JVD Pulmonary: CTA B/L; no W/R/R- good air movement GI: soft, NT, ND, (+)BS Psychiatric: appropriate; interactive, best I've seen since he's been here Neurological: Ox3  Musculoskeletal:    Cervical back: Normal range of motion and neck supple.     Comments: Right heel boggy and red.dressed- C/D/I this AM R foot- Charcot food- has no arch- no actual skin broken down- scabs R calf scattered  Skin has foam on it on back of L posterior thigh Also assessed L BKA today- blister that already had is actually deflated in the middle of L BKA- some localized erythema, likely due to sutures and staples- Also has B/L stage II inner buttocks pressure ulcers- which are new- about size of quarter or slightly - in w/c- cannot assess this AM L BKA has shrinker on it- C/D/I with limb guard in place- looks good/shaping. No change   UE 5/5 B/L in biceps, triceps, WE< grip and FA Les- RLE- HF 4+/5, KF/KE 4/5, DF and PF 4+/5 LLE- HF and KE at least 3+/5- no pain with movement today  Skin: L BKA as above.     Comments: IV R forearm LUE fistula-  Scattered scabs on R calf/shin Fallen arch on R foot/charcot foot R heel boggy - slightly less Neurological:    Mental Status: He is alert and oriented to person, place, and time.    Comments: Decreased to absent Sensation to light touch  from R knee downwards  RUE twitching occ.    Assessment/Plan: 1. Functional deficits which require 3+ hours per day of interdisciplinary therapy in a comprehensive inpatient rehab setting. Physiatrist is providing close team supervision and 24 hour management of active medical problems listed below. Physiatrist and rehab team continue to assess barriers to discharge/monitor patient progress toward functional and medical goals  Care Tool:  Bathing    Body parts bathed by patient: Right arm, Chest, Left arm, Abdomen, Front perineal area, Face   Body parts bathed by helper: Right lower leg Body parts n/a: Left lower leg   Bathing assist Assist Level: Moderate Assistance - Patient 50 - 74%     Upper Body Dressing/Undressing Upper body dressing   What is the patient wearing?: Pull over shirt    Upper body assist Assist Level: Minimal Assistance - Patient > 75%    Lower  Body Dressing/Undressing Lower body dressing      What is the patient wearing?: Pants, Underwear/pull up     Lower body assist Assist for lower body dressing: Maximal Assistance - Patient 25 - 49%     Toileting Toileting    Toileting assist Assist for toileting: Maximal Assistance - Patient 25 - 49%     Transfers Chair/bed transfer  Transfers assist     Chair/bed transfer assist level: Contact Guard/Touching assist (squat pivot) Chair/bed transfer assistive device: Sliding board   Locomotion Ambulation   Ambulation assist   Ambulation activity did not occur: Safety/medical concerns  Assist level: Moderate Assistance - Patient 50 - 74% Assistive device: Walker-rolling Max distance: 5'   Walk 10 feet activity   Assist  Walk 10 feet activity did not occur: Safety/medical concerns        Walk 50 feet activity   Assist Walk 50 feet with 2 turns activity did not occur: Safety/medical concerns         Walk 150 feet activity   Assist Walk 150 feet activity did not occur: Safety/medical concerns         Walk 10 feet on uneven surface  activity   Assist Walk 10 feet on uneven surfaces activity did not occur: Safety/medical concerns         Wheelchair     Assist Will patient use wheelchair at discharge?: Yes Type of Wheelchair: Manual    Wheelchair assist level: Supervision/Verbal cueing Max wheelchair distance: 150'    Wheelchair 50 feet with 2 turns activity    Assist        Assist Level: Supervision/Verbal cueing   Wheelchair 150 feet activity     Assist      Assist Level: Supervision/Verbal cueing   Blood pressure 112/61, pulse 80, temperature 98.2 F (36.8 C), temperature source Oral, resp. rate 18, height 5\' 11"  (1.803 m), weight 90.3 kg, SpO2 94 %.  Medical Problem List and Plan: 1.  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  Con't [name] - PT and OT 2.  Impaired mobility -DVT/anticoagulation:  Pharmaceutical: Continue Heparin             -antiplatelet therapy: ASA/Plavix 3. Pain Management: Change dilaudid to  oxycodone prn.             --resume gabapentin to help manage phantom pain.  7/16- reduce Oxy to 2.5 to 5 mg q4 hours prn for pain and ocn't tramadol as well  7/17- pain still an issue- nerve pain and residual limb- on max dose of Gabapentin- will ask therapy to do mirror therapy.  7/18 no pain but very groggy and having tremors , reduce gabapentin to 400mg    7/20- pain doing OK even with reduction in gabapentin to 200 mg QHS- con't regimen  7/26- doing better with lower dose gabapentin- will add Skelaxin- for muscle spasms- and d/c robaxin; and have therapy have him do hip extension!  7/29- will increase gabapentin to 300 mg QHS since helped sleep some- since sleeping so poorly.   7/30: continue current regimen  8/1- Pt has 1 Percocet scheduled at bedtime- sleeping better and pain controlled- con't regimen 4. Mood: LCSW to follow for evaluation and support.               -antipsychotic agents: N/A 5. Neuropsych: This patient is capable of making decisions on his own behalf. 6. Skin/Wound Care: Continue wound VAC             --On Zinc and Juven to help promote wound healing.  7/16- remove VAC- is due today-   7/17- has 1 blister, but looks OK- will monitor  7/21- skin looks better- dried blood- no erythema seen- con't shrinker- shaping w  7/22- had foam added for rubbing raw on back of posterior L thigh- will monitor  7/25- Has new B/L inner buttocks wounds/pressure ulcers- uncomfortable- asked nursing to turn him to get off backside. 8/1- cannot assess today- up in w/c- educated pt in front of OT_ only sitting up when eating/therapy- otherwise, prop on sides. Since ulcers aren't in documentation,  asked nursing to document again today.  7. Fluids/Electrolytes/Nutrition: Strict I/O with daily weights. 8. R -charcot foot: Encourage wearing CAM boot with activity. 9. T2DM: Hgb A1C-8.2. Monitor BS ac/hs             --continue Lantus 5 units with SSI for elevated BS  CBG (last 3)  Recent Labs    06/05/21 0603 06/05/21 1204 06/05/21 1228  GLUCAP 127* 62* 115*   7/30: hypoglycemic to 51: decrease Lantus to 6U  8/1- had episode of BG of 62 again today- will decrease again to 5 units daily.  10. ESRD: HD MWF at the end of the day to facilitate therapy and endurance issues             --No plans to resume PD. Midodrine for BP support in hemo.  7/20- K+ 5.2 today- per renal- also got approval from renal to increase Flomax 7/21- Per renal for K+ 5.2.  Managed by nephro 7/25- Na 129- per renal 7/29- Na up to 132- con't per renal 11. CAD w/recent NSTEMI: Treated medically w/ASA, Plavix and Lipitor.   12. Acute on chronic anemia: Loaded with IV ferric gluconate --continue [name] 150 mc weekly with serial CBC to monitor for stability . 13. PAD: On trental, plavix and ASA. 14. Chronic constipation: Continue Sorbitol bid.   7/28- LBM yesterday however pt insists it was Monday- asked for more Sorbitol- will increase Senokot to 2 tabs BID and add Miralax daily/increase Linzess to 290 mcg daily and stop scheduled Sorbitol.   8/1- stools finally going really well with new regimen- con't 15. Hyperparathyroidism: Being managed with [place] and  Calcitriol.  16. Insomnia/RLS: Resume gabapentin for phantom pain as well as RLS.        7/31: slept better last night but believes this was after he took an oxycodone at midnight- feels it makes him too groggy in the AM though. Discussed decreasing seroquel back to 50mg  and scheduling 5mg  oxycodone at 8pm and he is agreeable.   8/1- will con't Seroquel and Oxy- has an idiosyncratic reaction to Ambien- so don't give.  90 H/o  Depression/anxiety: Continue  Lexapro (started a few weeks ago) and klonopin.  7/16- will decrease Klonopin to 0.25 mg BID prn since too sleepy/dazed this AM  7/17- d/c lexapro per pt request.  18. Cough 7/16- will check CXR-  shows likely atelectasis since WBC is normal and afebrile.      19. Restart meds for hyperparathyroidism-  23. Oliguria/Urinary retention  7/17- required cath for 500cc overnight; will ask to bladder scan q8 hours- usually voided 1-2x/day per pt. Don't want him getting UTI. 7/20- will increase Flomax to 0.8 mg nightly- still requiring caths.  7/21- no change yet- con't regimen     7/22- will change cath to qday at 8pm daily.  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  7/26- will try letting pt not be cathed today and see if increased volume helps him.  If doesn't work- will have pt learn cathing- don't want to place foley.   7/27- 375cc scan this AM- will let get to 600cc max and then will need to cath, no matter- what  7/28- didn't void even with 700cc in bladder- will need to teach cathing.   8/1- asked nursing again to teach cathing- spoke to personally- have also placed order.  21. B/L Stage II inner buttock pressure ulcers  7/31: will continue local wound care- and monitor- turn to get off backside when in bed- only sit up when eating.  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   LOS: 18 days A FACE TO FACE EVALUATION WAS PERFORMED  Maxwell Harmon 06/05/2021, 1:40 PM

## 2021-06-05 NOTE — Progress Notes (Signed)
Occupational Therapy Session Note  Patient Details  Name: Maxwell Harmon MRN: [id number] Date of Birth: Jul 31, 1956  Today's Date: 06/05/2021 OT Individual Time: 0705-0801 OT Individual Time Calculation (min): 56 min    Short Term Goals: Week 3:  OT Short Term Goal 1 (Week 3): STGs = LTGs at Supervision  Skilled Therapeutic Interventions/Progress Updates:    Pt resting in bed upon arrival eating breakfast. OT intervention with focus on bed mobility, squat pivot tranfsers, SB transfers, w/c mobility, DME recommendations, activity tolerance, and safety awareness to increase independence with BADLs. Supine>sit EOB with supervision. Pt required min A for donning LLE limb guard and Lt shoe. Squat pivot tranfser to w/c with min A. W/c propulsion/mobility with supervision. SB transfer w/c<>EOM with supervision. Discussed use of DABSC when at home to facilitate use of SB for transfers until pt becomes proficient with squat pivot and stand pivot tranfers. Pt agreeable. Pt returned to room and completed grooming tasks w/c level at sink. Pt remained in w/c at sink. All needs within reach.  Therapy Documentation Precautions:  Precautions Precautions: Fall Precaution Comments: charcot foot, wound VAC, LLE NWB Required Braces or Orthoses: Other Brace Other Brace: Limb Guard Restrictions Weight Bearing Restrictions: Yes LLE Weight Bearing: Non weight bearing Other Position/Activity Restrictions: limb protector for mobility   Pain:  Pt c/o 4/10 LLE pain; RN notified by pt prior to therapy    Therapy/Group: Individual Therapy  Leroy Libman 06/05/2021, 8:34 AM

## 2021-06-05 NOTE — Progress Notes (Signed)
Physical Therapy Session Note  Patient Details  Name: Maxwell Harmon MRN: [id number] Date of Birth: [birth year]  Today's Date: 06/05/2021 PT Individual Time: 0800-0900; 1100-1200 PT Individual Time Calculation (min): 60 min and 60 min  Short Term Goals: Week 2:  PT Short Term Goal 1 (Week 2): =LTG due to ELOS  Skilled Therapeutic Interventions/Progress Updates:    Session 1: Pt received seated in w/c at sink performing oral hygiene. Pt reports some pain in L residual limb this AM, requesting pain medication from nursing. Pt is at Supervision to mod I level for w/c mobility to/from therapy gym with use of BUE. Pt requires some cueing for management of w/c parts. Squat pivot transfer to mat table with CGA. Sit to stand with min to mod A to RW from elevated mat table. Pt initially with posterior lean in standing, able to correct with cues. Stand pivot transfer mat table to w/c with RW and mod A, heavy posterior lean. Ambulation 2 x 5 ft with RW and mod A for balance, cues for shoulder depression and use of triceps to clear RLE when stepping. Pt fatigues quickly with gait/standing activity and continues to exhibit impaired standing balance with RW. Pt left seated in w/c in room with needs in reach at end of session.  Session 2: Pt received seated in w/c in room, agreeable to PT session. No complaints of pain. Manual w/c propulsion 2 x 150 ft with use of BUE at Supervision level. Squat pivot transfer to mat table with CGA. Sit to/from supine at Supervision level. Supine to prone at Supervision level. Session focus on prone hip flexor stretch and providing patient with HEP. Reviewed the following exercises:  Access Code: [email or web address] URL: [email or web address] Date: 06/05/2021 Prepared by: Excell Seltzer  Exercises Prone Lying with Towel Roll (Hip Flexor Stretch) (BKA) - 1 x daily - 7 x weekly - 10 minutes Prone Hip Extension with Residual Limb (BKA) - 1 x daily - 7 x weekly - 3 sets - 10  reps Supine Quad Set (BKA) - 1 x daily - 7 x weekly - 3 sets - 10 reps Supine Single Leg Bridge with Sound Leg (BKA) - 1 x daily - 7 x weekly - 3 sets - 10 reps Sidelying Hip Abduction (BKA) - 1 x daily - 7 x weekly - 3 sets - 10 reps Seated Knee Extension (BKA) - 1 x daily - 7 x weekly - 3 sets - 10 reps Seated March - 1 x daily - 7 x weekly - 3 sets - 10 reps Seated Hip Adduction Isometrics with Ball - 1 x daily - 7 x weekly - 3 sets - 10 reps  Sit to stand x 5 reps from mat table to RW with min A. Stand pivot transfer to w/c with RW and min A. Pt returned to bed at end of session, CGA for squat pivot transfer. Sit to supine Supervision. Pt left seated in bed with needs in reach, bed alarm in place at end of session.  Therapy Documentation Precautions:  Precautions Precautions: Fall Precaution Comments: charcot foot, wound VAC, LLE NWB Required Braces or Orthoses: Other Brace Other Brace: Limb Guard Restrictions Weight Bearing Restrictions: Yes LLE Weight Bearing: Non weight bearing Other Position/Activity Restrictions: limb protector for mobility   Therapy/Group: Individual Therapy   Excell Seltzer, PT, DPT, CSRS  06/05/2021, 12:03 PM

## 2021-06-05 NOTE — Significant Event (Signed)
Hypoglycemic Event  CBG: 62  Treatment: 8 oz juice/soda  Symptoms: None  Follow-up CBG: Time:1228 CBG Result:115  Possible Reasons for Event: Unknown    Maxwell Harmon

## 2021-06-05 NOTE — Progress Notes (Signed)
Pt arrived to HD from room 4M12  Goal 4L Reached without bp problems. Albumin not needed  Transferred back to floor stable

## 2021-06-05 NOTE — Progress Notes (Signed)
[facility] Progress Note   Outpatient orders: [facility], MWF, F180, 3hrs, 2k, 2.5cal, 137 na, 35bicarb, 400bfr, 16g, edw 86.5kg. hep 2k bolus  Assessment/ Plan:   ESRD: has been on HD due to issues with CCPD: inadequate drainage/UF (catheter malfunction?)  - no plans for further PD and is being scheduled to have PD cath removed as an outpatient, therefore no PD cath flushing needed.  Dressing to be changed daily - RN will facilitate;  -HD MWF, continue with volume removal as able.  Continue per schedule  -7/13 AVF infiltration thought to be due to arm moved - no further issues.    Cellulitis/diabetic foot, progressive gangrene Left transtibial amputation  7/8. [name]. [name]   Volume/ hypertension: EDW 86.5kg pre amputation. Weight 7/30 continue with volume removal as able   Anemia of Chronic Kidney Disease: Hemoglobin 8.0. No active infection so gave 0.5g IV iron load over 4 HD treatments. On aranesp here. Transfuse prn-  getting labs only on HD. Darbo 150 (7/28)   Secondary Hyperparathyroidism/Hyperphosphatemia: stopped auryxia dose with phos running on low side (3.3 on 7/25)    -  also on calcitriol, sensipar- getting labs only with HD -Continue to follow phosphorus   DM2 w/ hyperglycemia -mgmt per primary service   Hypoalbuminemia, protein calorie malnutrition -push protein    Subjective:   Feels well with no complaints    Objective:   BP 112/61 (BP Location: Right Arm)   Pulse 80   Temp 98.2 F (36.8 C) (Oral)   Resp 18   Ht 5\' 11"  (1.803 m)   Wt 90.3 kg   SpO2 94%   BMI 27.77 kg/m   Intake/Output Summary (Last 24 hours) at 06/05/2021 1239 Last data filed at 06/05/2021 5465 Gross per 24 hour  Intake 720 ml  Output 1600 ml  Net -880 ml   Weight change: 0.4 kg  Physical Gen:nad, nontoxic appearing CVS: Normal rate Resp:normal wob on RA  Ext: Trace edema in the right lower extremity, left lower extremity amputated Neuro: somnolent but  arousable Access: lt BCF  +b/t, swollen but no active extravasation.  Imaging: No results found.  Labs: BMET Recent Labs  Lab 05/29/21 1445 06/01/21 1419 06/02/21 1325  NA 129* 129* 132*  K 4.7 3.9 3.7  CL 93* 94* 97*  CO2 25 23 24   GLUCOSE 172* 144* 117*  BUN 84* 71* 38*  CREATININE 6.08* 6.36* 4.80*  CALCIUM 9.5 8.8* 8.8*  PHOS 3.3 4.5 3.3   CBC Recent Labs  Lab 05/29/21 1445 06/01/21 1419 06/02/21 1325  WBC 5.8 4.6 4.5  HGB 8.6* 8.4* 9.1*  HCT 27.3* 27.1* 29.0*  MCV 104.2* 105.9* 105.1*  PLT 172 160 162    Medications:     aspirin EC  81 mg Oral Daily   atorvastatin  80 mg Oral Daily   calcitRIOL  0.25 mcg Oral BID   Chlorhexidine Gluconate Cloth  6 each Topical Q0600   Chlorhexidine Gluconate Cloth  6 each Topical Q0600   cinacalcet  30 mg Oral Q Mon   clopidogrel  75 mg Oral Daily   [START ON 06/07/2021] darbepoetin ([name]) injection - DIALYSIS  150 mcg Intravenous Q Wed-HD   feeding supplement (NEPRO CARB STEADY)  237 mL Oral QHS   furosemide  80 mg Oral BID   gabapentin  300 mg Oral QHS   gentamicin cream   Topical Daily   heparin  5,000 Units Subcutaneous Q8H   insulin aspart  0-6 Units Subcutaneous  TID WC   insulin glargine  6 Units Subcutaneous QHS   linaclotide  290 mcg Oral QAC breakfast   midodrine  10 mg Oral Q M,W,F-HD   multivitamin  1 tablet Oral QHS   nutrition supplement (JUVEN)  1 packet Oral BID BM   oxyCODONE-acetaminophen  1 tablet Oral QHS   pantoprazole  40 mg Oral Daily   pentoxifylline  400 mg Oral Q breakfast   polyethylene glycol  17 g Oral Daily   senna-docusate  2 tablet Oral BID   tamsulosin  0.8 mg Oral QHS    [name]  06/05/2021, 12:39 PM

## 2021-06-05 NOTE — Plan of Care (Signed)
  Problem: RH Dressing Goal: LTG Patient will perform lower body dressing w/assist (OT) Description: LTG: Patient will perform lower body dressing with assist, with/without cues in positioning using equipment (OT) Flowsheets (Taken 06/05/2021 1233) LTG: Pt will perform lower body dressing with assistance level of: Minimal Assistance - Patient > 75% Note: Goal downgraded 8/1-ESD   Problem: RH Toileting Goal: LTG Patient will perform toileting task (3/3 steps) with assistance level (OT) Description: LTG: Patient will perform toileting task (3/3 steps) with assistance level (OT)  Flowsheets (Taken 06/05/2021 1233) LTG: Pt will perform toileting task (3/3 steps) with assistance level: Minimal Assistance - Patient > 75% Note: Goal downgraded 8/1-ESD   Problem: RH Toilet Transfers Goal: LTG Patient will perform toilet transfers w/assist (OT) Description: LTG: Patient will perform toilet transfers with assist, with/without cues using equipment (OT) Flowsheets (Taken 06/05/2021 1233) LTG: Pt will perform toilet transfers with assistance level of: Contact Guard/Touching assist Note: Goal downgraded 8/1-ESD

## 2021-06-06 ENCOUNTER — Other Ambulatory Visit (HOSPITAL_COMMUNITY): Payer: Self-pay

## 2021-06-06 DIAGNOSIS — L89302 Pressure ulcer of unspecified buttock, stage 2: Secondary | ICD-10-CM

## 2021-06-06 LAB — GLUCOSE, CAPILLARY
Glucose-Capillary: 141 mg/dL — ABNORMAL HIGH (ref 70–99)
Glucose-Capillary: 109 mg/dL — ABNORMAL HIGH (ref 70–99)
Glucose-Capillary: 168 mg/dL — ABNORMAL HIGH (ref 70–99)
Glucose-Capillary: 184 mg/dL — ABNORMAL HIGH (ref 70–99)

## 2021-06-06 MED ORDER — OXYCODONE-ACETAMINOPHEN 5-325 MG PO TABS
1.0000 | ORAL_TABLET | Freq: Every day | ORAL | Status: DC
Start: 2021-06-06 — End: 2021-06-08
  Administered 2021-06-06 – 2021-06-07 (×2): 1 via ORAL
  Filled 2021-06-06 (×2): qty 1

## 2021-06-06 MED ORDER — QUETIAPINE FUMARATE 50 MG PO TABS
50.0000 mg | ORAL_TABLET | Freq: Every day | ORAL | Status: DC
  Administered 2021-06-06 – 2021-06-07 (×2): 50 mg via ORAL
  Filled 2021-06-06 (×2): qty 1

## 2021-06-06 NOTE — Progress Notes (Signed)
PROGRESS NOTE   Subjective/Complaints:  Didn't sleep as well last night as he did the prior. Says he received seroquel around 830. Still retaining urine. Denies pain.   ROS: Patient denies fever, rash, sore throat, blurred vision, nausea, vomiting, diarrhea, cough, shortness of breath or chest pain, joint or back pain, headache, or mood change.    Objective:   No results found. Recent Labs    06/05/21 1528  WBC 5.7  HGB 9.4*  HCT 30.0*  PLT 159     Recent Labs    06/05/21 1528  NA 130*  K 5.0  CL 95*  CO2 23  GLUCOSE 158*  BUN 53*  CREATININE 6.11*  CALCIUM 8.9      Intake/Output Summary (Last 24 hours) at 06/06/2021 1153 Last data filed at 06/06/2021 0750 Gross per 24 hour  Intake 560 ml  Output 4050 ml  Net -3490 ml     Pressure Injury 05/18/21 Heel Right Stage 2 -  Partial thickness loss of dermis presenting as a shallow open injury with a red, pink wound bed without slough. small area on heel, covered with foam (Active)  05/18/21 2200  Location: Heel  Location Orientation: Right  Staging: Stage 2 -  Partial thickness loss of dermis presenting as a shallow open injury with a red, pink wound bed without slough.  Wound Description (Comments): small area on heel, covered with foam  Present on Admission: Yes    Physical Exam: Vital Signs Blood pressure 122/73, pulse (!) 101, temperature 98.5 F (36.9 C), temperature source Oral, resp. rate 19, height 5\' 11"  (1.803 m), weight 86.2 kg, SpO2 95 %.   Constitutional: No distress . Vital signs reviewed. HEENT: EOMI, oral membranes moist Neck: supple Cardiovascular: RRR without murmur. No JVD    Respiratory/Chest: CTA Bilaterally without wheezes or rales. Normal effort    GI/Abdomen: BS +, non-tender, non-distended Ext: no clubbing, cyanosis, or edema Psych: pleasant and cooperative  Musculoskeletal:    Cervical back: Normal range of motion and  neck supple.    Comments: Right heel boggy and red.dressed- stable R foot- Charcot food- has no arch- no actual skin broken down- scabs R calf scattered LBK incision clean and intact/healing without drainage- Also has B/L stage II inner buttocks pressure ulcers-   L BKA has shrinker on it-   UE 5/5 B/L in biceps, triceps, WE< grip and FA Les- RLE- HF 4+/5, KF/KE 4/5, DF and PF 4+/5 LLE- HF and KE at least 3+/5- no pain with movement today  Skin: see above Neurological:    Mental Status: He is alert and oriented to person, place, and time.    Comments: Decreased to absent Sensation to light touch from R knee downwards  RUE twitching occ.    Assessment/Plan: 1. Functional deficits which require 3+ hours per day of interdisciplinary therapy in a comprehensive inpatient rehab setting. Physiatrist is providing close team supervision and 24 hour management of active medical problems listed below. Physiatrist and rehab team continue to assess barriers to discharge/monitor patient progress toward functional and medical goals  Care Tool:  Bathing    Body parts bathed by patient: Right  arm, Chest, Left arm, Abdomen, Front perineal area, Face   Body parts bathed by helper: Right lower leg Body parts n/a: Left lower leg   Bathing assist Assist Level: Moderate Assistance - Patient 50 - 74%     Upper Body Dressing/Undressing Upper body dressing   What is the patient wearing?: Pull over shirt    Upper body assist Assist Level: Minimal Assistance - Patient > 75%    Lower Body Dressing/Undressing Lower body dressing      What is the patient wearing?: Pants, Underwear/pull up     Lower body assist Assist for lower body dressing: Maximal Assistance - Patient 25 - 49%     Toileting Toileting    Toileting assist Assist for toileting: Maximal Assistance - Patient 25 - 49%     Transfers Chair/bed transfer  Transfers assist     Chair/bed transfer assist level: Contact  Guard/Touching assist (squat pivot) Chair/bed transfer assistive device: Sliding board   Locomotion Ambulation   Ambulation assist   Ambulation activity did not occur: Safety/medical concerns  Assist level: Moderate Assistance - Patient 50 - 74% Assistive device: Walker-rolling Max distance: 5'   Walk 10 feet activity   Assist  Walk 10 feet activity did not occur: Safety/medical concerns        Walk 50 feet activity   Assist Walk 50 feet with 2 turns activity did not occur: Safety/medical concerns         Walk 150 feet activity   Assist Walk 150 feet activity did not occur: Safety/medical concerns         Walk 10 feet on uneven surface  activity   Assist Walk 10 feet on uneven surfaces activity did not occur: Safety/medical concerns         Wheelchair     Assist Will patient use wheelchair at discharge?: Yes Type of Wheelchair: Manual    Wheelchair assist level: Supervision/Verbal cueing Max wheelchair distance: 150'    Wheelchair 50 feet with 2 turns activity    Assist        Assist Level: Supervision/Verbal cueing   Wheelchair 150 feet activity     Assist      Assist Level: Supervision/Verbal cueing   Blood pressure 122/73, pulse (!) 101, temperature 98.5 F (36.9 C), temperature source Oral, resp. rate 19, height 5\' 11"  (1.803 m), weight 86.2 kg, SpO2 95 %.  Medical Problem List and Plan: 1.  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  Con't [name] - PT and OT, team conf today 2.  Impaired mobility -DVT/anticoagulation:  Pharmaceutical: Continue Heparin             -antiplatelet therapy: ASA/Plavix 3. Pain Management: Change dilaudid to oxycodone prn.             --resume gabapentin to help manage phantom pain.  7/16- reduce Oxy to 2.5  to 5 mg q4 hours prn for pain and ocn't tramadol as well  7/17- pain still an issue- nerve pain and residual limb- on max dose of Gabapentin- will ask therapy to do mirror therapy.  7/18 no pain but very groggy and having tremors ,  reduce gabapentin to 400mg    7/20- pain doing OK even with reduction in gabapentin to 200 mg QHS- con't regimen  7/26- doing better with lower dose gabapentin- will add Skelaxin- for muscle spasms- and d/c robaxin; and have therapy have him do hip extension!  7/29- will increase gabapentin to 300 mg QHS since helped sleep some- since sleeping so poorly.   7/30: continue current regimen  8/1- Pt has 1 Percocet scheduled at bedtime- sleeping better and pain controlled- con't regimen 4. Mood: LCSW to follow for evaluation and support.               -antipsychotic agents: N/A 5. Neuropsych: This patient is capable of making decisions on his own behalf. 6. Skin/Wound Care: Continue wound VAC             --On Zinc and Juven to help promote wound healing.  7/16- remove VAC- is due today-   7/17- has 1 blister, but looks OK- will monitor  7/21- skin looks better- dried blood- no erythema seen- con't shrinker- shaping w  7/22- had foam added for rubbing raw on back of posterior L thigh- will monitor  7/25- Has new B/L inner buttocks wounds/pressure ulcers- uncomfortable- asked nursing to turn him to get off backside. 8/2 pt encouraged to turn/weight shift 7. Fluids/Electrolytes/Nutrition: Strict I/O with daily weights. 8. R -charcot foot: Encourage wearing CAM boot with activity. 9. T2DM: Hgb A1C-8.2. Monitor BS ac/hs             --continue Lantus 5 units with SSI for elevated BS  CBG (last 3)  Recent Labs    06/05/21 2053 06/06/21 0548 06/06/21 1121  GLUCAP 99 141* 109*   7/30: hypoglycemic to 51: decrease Lantus to 6U  8/1 lantus decreased to 5 units daily.  10. ESRD: HD MWF at the end of the day to facilitate therapy and endurance issues             --No plans  to resume PD. Midodrine for BP support in hemo.  7/20- K+ 5.2 today- per renal- also got approval from renal to increase Flomax 7/21- Per renal for K+ 5.2.  Managed by nephro 7/25- Na 129- per renal 7/29- Na up to 132- con't per renal 11. CAD w/recent NSTEMI: Treated medically w/ASA, Plavix and Lipitor.   12. Acute on chronic anemia: Loaded with IV ferric gluconate --continue [name] 150 mc weekly with serial CBC to monitor for stability . 13. PAD: On trental, plavix and ASA. 14. Chronic constipation: Continue Sorbitol bid.   7/28- LBM yesterday however pt insists it was Monday- asked for more Sorbitol- will increase Senokot to 2 tabs BID and add Miralax daily/increase Linzess to 290 mcg daily and stop scheduled Sorbitol.   8/1-2- stools finally going really well with new regimen- con't 15. Hyperparathyroidism: Being managed with aruxia and Calcitriol.  16. Insomnia/RLS: Resume gabapentin for phantom pain as well as RLS.        7/31: slept better last night but believes this was after he took an oxycodone at midnight- feels it makes him too groggy in the AM though. Discussed decreasing seroquel back to 50mg  and scheduling 5mg  oxycodone at 8pm and he is agreeable.   8/2- didn't do well with above regimen last night   -will try 75mg  seroquel at 9pm with oxycodone 5mg  17 H/o  Depression/anxiety: Continue Lexapro (started a few weeks ago) and klonopin.  7/16- will decrease Klonopin to 0.25 mg BID prn since too sleepy/dazed this  AM  7/17- d/c lexapro per pt request.  18. Cough    CXR shows likely atelectasis since WBC is normal and afebrile.      19. Restart meds for hyperparathyroidism-  75. Oliguria/Urinary retention  7/17- required cath for 500cc overnight; will ask to bladder scan q8 hours- usually voided 1-2x/day per pt. Don't want him getting UTI. 7/20- will increase Flomax to 0.8 mg nightly- still requiring caths.  7/21- no change yet- con't regimen     7/22- will change cath to qday  at 8pm daily.  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  7/26- will try letting pt not be cathed today and see if increased volume helps him.  If doesn't work- will have pt learn cathing- don't want to place foley.   7/27- 375cc scan this AM- will let get to 600cc max and then will need to cath, no matter- what  7/28- didn't void even with 700cc in bladder- will need to teach cathing.   8/2 pt needs to learn to self-cath    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    LOS: 19 days A FACE TO FACE EVALUATION WAS PERFORMED  Meredith Staggers 06/06/2021, 11:53 AM

## 2021-06-06 NOTE — Progress Notes (Signed)
[facility] Progress Note   Outpatient orders: [place], MWF, F180, 3hrs, 2k, 2.5cal, 137 na, 35bicarb, 400bfr, 16g, edw 86.5kg. hep 2k bolus  Assessment/ Plan:   ESRD: has been on HD due to issues with CCPD: inadequate drainage/UF (catheter malfunction?)  - no plans for further PD and is being scheduled to have PD cath removed as an outpatient, therefore no PD cath flushing needed.  Dressing to be changed daily - RN will facilitate;  -HD MWF, continue with volume removal as able.  Continue per schedule  -7/13 AVF infiltration thought to be due to arm moved - no further issues.    Cellulitis/diabetic foot, progressive gangrene Left transtibial amputation  7/8. [name]. [name]   Volume/ hypertension: EDW 86.5kg pre amputation.  Volume status improved.  Continue with volume removal on dialysis   Anemia of Chronic Kidney Disease: Hemoglobin 8.0. No active infection so gave 0.5g IV iron load over 4 HD treatments. On aranesp here. Transfuse prn-  getting labs only on HD. Darbo 150 (7/28)   Secondary Hyperparathyroidism/Hyperphosphatemia: stopped auryxia dose with phos running on low side (3.3 on 7/25)    -  also on calcitriol, sensipar- getting labs only with HD -Continue to follow phosphorus   DM2 w/ hyperglycemia -mgmt per primary service   Hypoalbuminemia, protein calorie malnutrition -push protein    Subjective:   Feels well with no complaints.  Feels like volume status is overall improved   Objective:   BP 122/73 (BP Location: Right Arm)   Pulse (!) 101   Temp 98.5 F (36.9 C) (Oral)   Resp 19   Ht 5\' 11"  (1.803 m)   Wt 86.2 kg   SpO2 95%   BMI 26.50 kg/m   Intake/Output Summary (Last 24 hours) at 06/06/2021 1021 Last data filed at 06/06/2021 0750 Gross per 24 hour  Intake 560 ml  Output 4050 ml  Net -3490 ml   Weight change: -0.3 kg  Physical Gen:nad, nontoxic appearing CVS: Normal rate Resp:normal wob on RA  Ext: Trace edema in the right  lower extremity, left lower extremity amputated Neuro: somnolent but arousable Access: lt BCF  +b/t, swollen but no active extravasation.  Imaging: No results found.  Labs: BMET Recent Labs  Lab 06/01/21 1419 06/02/21 1325 06/05/21 1528  NA 129* 132* 130*  K 3.9 3.7 5.0  CL 94* 97* 95*  CO2 23 24 23   GLUCOSE 144* 117* 158*  BUN 71* 38* 53*  CREATININE 6.36* 4.80* 6.11*  CALCIUM 8.8* 8.8* 8.9  PHOS 4.5 3.3 4.6   CBC Recent Labs  Lab 06/01/21 1419 06/02/21 1325 06/05/21 1528  WBC 4.6 4.5 5.7  HGB 8.4* 9.1* 9.4*  HCT 27.1* 29.0* 30.0*  MCV 105.9* 105.1* 106.0*  PLT 160 162 159    Medications:     aspirin EC  81 mg Oral Daily   atorvastatin  80 mg Oral Daily   calcitRIOL  0.25 mcg Oral BID   Chlorhexidine Gluconate Cloth  6 each Topical Q0600   Chlorhexidine Gluconate Cloth  6 each Topical Q0600   cinacalcet  30 mg Oral Q Mon   clopidogrel  75 mg Oral Daily   [START ON 06/07/2021] darbepoetin ([name]) injection - DIALYSIS  150 mcg Intravenous Q Wed-HD   feeding supplement (NEPRO CARB STEADY)  237 mL Oral QHS   furosemide  80 mg Oral BID   gabapentin  300 mg Oral QHS   gentamicin cream   Topical Daily   heparin  5,000  Units Subcutaneous Q8H   insulin aspart  0-6 Units Subcutaneous TID WC   insulin glargine  5 Units Subcutaneous QHS   linaclotide  290 mcg Oral QAC breakfast   midodrine  10 mg Oral Q M,W,F-HD   multivitamin  1 tablet Oral QHS   nutrition supplement (JUVEN)  1 packet Oral BID BM   oxyCODONE-acetaminophen  1 tablet Oral QHS   pantoprazole  40 mg Oral Daily   pentoxifylline  400 mg Oral Q breakfast   polyethylene glycol  17 g Oral Daily   QUEtiapine  50 mg Oral QHS   senna-docusate  2 tablet Oral BID   tamsulosin  0.8 mg Oral QHS    [name]  06/06/2021, 10:21 AM

## 2021-06-06 NOTE — Progress Notes (Signed)
Physical Therapy Session Note  Patient Details  Name: Maxwell Harmon MRN: [id number] Date of Birth: 12-Sep-1956  Today's Date: 06/06/2021 PT Individual Time: 9379-0240 PT Individual Time Calculation (min): 27 min   Short Term Goals: Week 1:  PT Short Term Goal 1 (Week 1): Pt will initiate gait training with LRAD PT Short Term Goal 1 - Progress (Week 1): Not met PT Short Term Goal 2 (Week 1): Pt will perform lateral transfer with CGA PT Short Term Goal 2 - Progress (Week 1): Met PT Short Term Goal 3 (Week 1): Pt will be mod I with w/c propulsion up to 150 ft PT Short Term Goal 3 - Progress (Week 1): Progressing toward goal Week 2:  PT Short Term Goal 1 (Week 2): =LTG due to ELOS  Skilled Therapeutic Interventions/Progress Updates:   Received pt sitting in WC, pt agreeable to PT treatment, and denied any pain during session. Session with emphasis on functional mobility/transfers, generalized strengthening, dynamic standing balance/coordination, and improved activity tolerance. Pt performed WC mobility 48f x 2 trials using BUE and supervision to/from ortho gym with emphasis on UE strengthening. Performed x 3 squat<>pivot transfers with CGA throughout session with pt able to set up all transfers to/from mat with supervision. Pt transferred sit<>stand with RW and min A x 3 trials and worked on dynamic standing balance tossing horseshoes with RUE with min A for balance x 3 trials with 1 L lateral LOB. Pt requested to return to bed at end of session and transferred sit<>supine with supervision and doffed limb guard and shoe mod I. Concluded session with pt semi-reclined in bed, needs within reach, and bed alarm on.   Therapy Documentation Precautions:  Precautions Precautions: Fall Precaution Comments: charcot foot, wound VAC, LLE NWB Required Braces or Orthoses: Other Brace Other Brace: Limb Guard Restrictions Weight Bearing Restrictions: Yes LLE Weight Bearing: Non weight bearing Other  Position/Activity Restrictions: limb protector for mobility  Therapy/Group: Individual Therapy AAlfonse AlpersPT, DPT   06/06/2021, 7:15 AM

## 2021-06-06 NOTE — Progress Notes (Signed)
Occupational Therapy Session Note  Patient Details  Name: Maxwell Harmon MRN: [id number] Date of Birth: 10-May-1956  Today's Date: 06/06/2021 OT Individual Time: 4481-8563 OT Individual Time Calculation (min): 48 min    Short Term Goals: Week 3:  OT Short Term Goal 1 (Week 3): STGs = LTGs at Supervision  Skilled Therapeutic Interventions/Progress Updates:    Pt resting in bed upon arrival and agreeable to getting OOB. Pt required min A for donning Limb Guard and Rt shoe. Squat pivot transfer to w/c with CGA. Pt propelled w/c to tub room to problem solve home setting/bathroom layout. W/c will fit into bathroom but not sure if w/c will be able to pull up to tub and/or toilet. Recommended pt problem solve with HHOT/PT before attempting transfer. Pt continued to c/o Rt shoulder pain but demonstrates full AROM with no change in pain. Scifit arm bike on level 2 for joint mobilization. No change in pain level. Pain is not constant. Pt popelled w/c to ADL apt and practiced w/c<>bed squat pivot transfer with CGA. Recommended pt place DABSC adjacent to bed at foot of bed for use until a workable solution is determined for his bathroom. Pt verbalized understanding. Pt propelled back to room and remained in w/c with all needs within reach.   Therapy Documentation Precautions:  Precautions Precautions: Fall Precaution Comments: charcot foot, wound VAC, LLE NWB Required Braces or Orthoses: Other Brace Other Brace: Limb Guard Restrictions Weight Bearing Restrictions: Yes LLE Weight Bearing: Non weight bearing Other Position/Activity Restrictions: limb protector for mobility  Pain:  Pt c/o Rt shoulder discomfort; activity and RN Benjie Karvonen notified    Therapy/Group: Individual Therapy  Leroy Libman 06/06/2021, 2:38 PM

## 2021-06-06 NOTE — Progress Notes (Signed)
Patient ID: Maxwell Harmon, male   DOB: 1955-11-14, 64 y.o.   MRN: [id number]  SW received phone call from [facility] (p:[phone]/f:[phone]) rescinding referral due to staffing changes. SW to discuss with pt alternate HHA in area.   SW met with pt in room to provide updates on gains made in rehab, the need for catheters at d/c and learning to self-cath while here, and finding a new HHA. Pt called his wife Angie while SW in the room, and SW reviewed above. No HHA preference.   SW sent referral to [place]/Intake with [facility] (p:[phone]/[phone]).   SW spoke with Loretta/[facility] called stating their branch is in [place], and sent the referral to the [employer] [phone].  SW spoke with [place]/[facility] Joretta Bachelor [phone]) to discuss referral. Reports it has been received and in process of reviewing. Will f/u.  Loralee Pacas, MSW, [place] Office: [phone] Cell: [phone] Fax: [phone]

## 2021-06-06 NOTE — TOC Benefit Eligibility Note (Signed)
Patient Teacher, English as a foreign language completed.    The patient is currently admitted and upon discharge could be taking Linzess (linaclotide) 290 mcg.  The current 30 day co-pay is, $0.00.   The patient is insured through [place], [place] Patient Advocate Specialist [place] Team Direct Number: [phone]  Fax: [phone]

## 2021-06-06 NOTE — Progress Notes (Signed)
Occupational Therapy Session Note  Patient Details  Name: Maxwell Harmon MRN: [id number] Date of Birth: October 06, 1956  Today's Date: 06/06/2021 OT Individual Time: 0700-0810 OT Individual Time Calculation (min): 70 min    Short Term Goals: Week 3:  OT Short Term Goal 1 (Week 3): STGs = LTGs at Supervision  Skilled Therapeutic Interventions/Progress Updates:    Pt resting in bed upon arrival and ready for therapy. Discussed taking a shower tomorrow and pt agreeable. Pt requires assistance donning limb guard and Rt shoe. SB transfer to w/c with supervision. Pt propelled w/c to ortho gym and practiced propelling up/down ramp with supervision. Discussed w/c<>bed transfers. Bed height is 25" but with soft mattress. Pt completed squat pivot tranfser to higher surface with min A. Pt completed SB tranfser X 3 with supervision. Pt pleased he can use SB for transfer in/out of bed. Pt provided HEP for theraband exercises and dompleted 1 set of each exercise. Pt's LUE significantly weaker the RUE 2/2 fistula. Pt propelled back to room and remained in w/c with RN present. All needs within reach.   Therapy Documentation Precautions:  Precautions Precautions: Fall Precaution Comments: charcot foot, wound VAC, LLE NWB Required Braces or Orthoses: Other Brace Other Brace: Limb Guard Restrictions Weight Bearing Restrictions: Yes LLE Weight Bearing: Non weight bearing Other Position/Activity Restrictions: limb protector for mobility Pain: Pt c/o 3/10 LLE pain; RN notified    Therapy/Group: Individual Therapy  Leroy Libman 06/06/2021, 8:13 AM

## 2021-06-06 NOTE — Progress Notes (Signed)
Physical Therapy Session Note  Patient Details  Name: Maxwell Harmon MRN: [id number] Date of Birth: [birth year]  Today's Date: 06/06/2021 PT Individual Time: 0900-1000 PT Individual Time Calculation (min): 60 min   Short Term Goals: Week 2:  PT Short Term Goal 1 (Week 2): =LTG due to ELOS  Skilled Therapeutic Interventions/Progress Updates:    Pt received seated in w/c in room, agreeable to PT session. No complaints of pain this date. Pt is at close Supervision to mod I level for w/c mobility to/from therapy gym. Reviewed how to ascend ramp backwards with use of RLE to assist, initially at min A level progressing to close Supervision. Squat pivot transfer w/c to/from mat table with close Supervision to CGA throughout session. Sit to stand with CGA to min A to RW throughout session. Ambulation x 7 ft, x 10 ft with RW and min A for balance, focus on increasing RLE clearance with gait via "hopping". Pt exhibits improved ability to lift RLE to clear it with practice as well as exhibits improved ability to performing longer distance gait with decreased assist level. Squat pivot transfer w/c to/from real bed in therapy apartment (26" height) with CGA to min A. Sit to/from supine on real bed independently. Pt returned to w/c at end of session, left seated in room with needs in reach. Cotreatment session with LRT.  Therapy Documentation Precautions:  Precautions Precautions: Fall Precaution Comments: charcot foot, wound VAC, LLE NWB Required Braces or Orthoses: Other Brace Other Brace: Limb Guard Restrictions Weight Bearing Restrictions: Yes LLE Weight Bearing: Non weight bearing Other Position/Activity Restrictions: limb protector for mobility   Therapy/Group: Individual Therapy   Excell Seltzer, PT, DPT, CSRS  06/06/2021, 5:36 PM

## 2021-06-06 NOTE — Patient Care Conference (Signed)
Inpatient RehabilitationTeam Conference and Plan of Care Update Date: 06/06/2021   Time: 11:12 AM    Patient Name: Maxwell Harmon      Medical Record Number: [id number]  Date of Birth: 06-04-1956 Sex: Male         Room/Bed: 4M12C/4M12C-01 Payor Info: Payor: MEDICARE / Plan: MEDICARE PART A AND B / Product Type: *No Product type* /    Admit Date/Time:  05/18/2021  9:49 PM  Primary Diagnosis:  Below-knee amputation of left lower extremity [facility])  Hospital Problems: Principal Problem:   Below-knee amputation of left lower extremity ([place]) Active Problems:   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[place])   ESRD on hemodialysis ([place])   Pressure ulcer, stage 2 ([facility])   Acute on chronic systolic (congestive) heart failure [facility])    Expected Discharge Date: Expected Discharge Date: 06/08/21  Team Members Present: Physician leading conference: Dr. Alger Simons Social Worker Present: Loralee Pacas, [place] Nurse Present: Dorthula Nettles, RN PT Present: Excell Seltzer, PT OT Present: Roanna Epley, [place], OT PPS Coordinator present : Gunnar Fusi, SLP     Current Status/Progress Goal Weekly Team Focus  Bowel/Bladder   I&O cath x1 daily, continent bowel  resolve urinary retention, regain regular bowel and urinary pattern within limits for patient.      Swallow/Nutrition/ Hydration             ADL's   LB dressing-min A: SB transfers-supervision; squat pivot transfers-CGA; toileting-mod A  bathing-supervision; LB dressing-min A; toilet transfers-CGA; toileting-min A  BADL retraining, functional transfers, activity tolerance, UB strengthening   Mobility   Supervision bed mobility, CGA squat pivot transfer, min to mod A sit to stand and transfers with RW, Supervision to mod I w/c mobility, amb x 5 ft with RW mod A  Supervision overall at w/c level, d/c gait goals due to progress  transfers, sit to stand, amputee edu, LE strengthening, family edu,  d/c planning, standing balance, gait as able   Communication             Safety/Cognition/ Behavioral Observations            Pain   reports pain to sacrum  Pain level <3/10.      Skin   left BKA site, stage 2 to sacrum  Promote healing to areas of skin impairment. No s/sx of infection.        Discharge Planning:  Pt to d/c to home with his wife who is primary caregiver.Wife works 3 days per week, however, believes that she can work out additional support for someone to be with patient, and/or take time off. HHA pending as previous agency rescinded offer.   Team Discussion: Medically ready for discharge. Continent of bowel. I&O cath x1 per day, HD 3x per week. Tylenol helps with pain. Will get sleep medication now. Stage 2 to the sacrum with foam dressing. Contact guard overall, on track to meet supervision goals.  Patient on target to meet rehab goals: yes  *See Care Plan and progress notes for long and short-term goals.   Revisions to Treatment Plan:  Medically ready for discharge.  Teaching Needs: Family education, medication management, pain management, skin/wound care, dressing changes, I&O cath education, transfer training, gait training, balance training, endurance training, safety awareness.  Current Barriers to Discharge: Decreased caregiver support, Medical stability, Home enviroment access/layout, Wound care, Lack of/limited family support, Weight, Hemodialysis, Weight bearing restrictions, Medication compliance, and Behavior  Possible Resolutions to Barriers:  Continue current medications, provide emotional support.     Medical Summary Current Status: BKA healing nicely. urine retention. needs to cath 1 or 2x daily. insomnia has been an issue  Barriers to Discharge: Medical stability   Possible Resolutions to Barriers/Weekly Focus: wound care, rx urine retention, pain mgt, pros ed   Continued Need for Acute Rehabilitation Level of Care: The patient requires daily  medical management by a physician with specialized training in physical medicine and rehabilitation for the following reasons: Direction of a multidisciplinary physical rehabilitation program to maximize functional independence : Yes Medical management of patient stability for increased activity during participation in an intensive rehabilitation regime.: Yes Analysis of laboratory values and/or radiology reports with any subsequent need for medication adjustment and/or medical intervention. : Yes   I attest that I was present, lead the team conference, and concur with the assessment and plan of the team.   Cristi Loron 06/06/2021, 5:05 PM

## 2021-06-07 DIAGNOSIS — I739 Peripheral vascular disease, unspecified: Secondary | ICD-10-CM

## 2021-06-07 DIAGNOSIS — R339 Retention of urine, unspecified: Secondary | ICD-10-CM

## 2021-06-07 LAB — GLUCOSE, CAPILLARY
Glucose-Capillary: 95 mg/dL (ref 70–99)
Glucose-Capillary: 108 mg/dL — ABNORMAL HIGH (ref 70–99)
Glucose-Capillary: 157 mg/dL — ABNORMAL HIGH (ref 70–99)

## 2021-06-07 LAB — CBC
HCT: 31.6 % — ABNORMAL LOW (ref 39.0–52.0)
Hemoglobin: 9.9 g/dL — ABNORMAL LOW (ref 13.0–17.0)
MCH: 33.7 pg (ref 26.0–34.0)
MCHC: 31.3 g/dL (ref 30.0–36.0)
MCV: 107.5 fL — ABNORMAL HIGH (ref 80.0–100.0)
Platelets: 163 10*3/uL (ref 150–400)
RBC: 2.94 MIL/uL — ABNORMAL LOW (ref 4.22–5.81)
RDW: 19.9 % — ABNORMAL HIGH (ref 11.5–15.5)
WBC: 10.1 10*3/uL (ref 4.0–10.5)
nRBC: 0 % (ref 0.0–0.2)

## 2021-06-07 LAB — RENAL FUNCTION PANEL
Albumin: 2.8 g/dL — ABNORMAL LOW (ref 3.5–5.0)
Anion gap: 13 (ref 5–15)
BUN: 45 mg/dL — ABNORMAL HIGH (ref 8–23)
CO2: 24 mmol/L (ref 22–32)
Calcium: 9.1 mg/dL (ref 8.9–10.3)
Chloride: 94 mmol/L — ABNORMAL LOW (ref 98–111)
Creatinine, Ser: 5.74 mg/dL — ABNORMAL HIGH (ref 0.61–1.24)
GFR, Estimated: 10 mL/min — ABNORMAL LOW (ref >60)
Glucose, Bld: 120 mg/dL — ABNORMAL HIGH (ref 70–99)
Phosphorus: 3.7 mg/dL (ref 2.5–4.6)
Potassium: 4.3 mmol/L (ref 3.5–5.1)
Sodium: 131 mmol/L — ABNORMAL LOW (ref 135–145)

## 2021-06-07 LAB — HEPATITIS B SURFACE ANTIGEN: Hepatitis B Surface Ag: NONREACTIVE

## 2021-06-07 MED ORDER — BETHANECHOL CHLORIDE 10 MG PO TABS
10.0000 mg | ORAL_TABLET | Freq: Three times a day (TID) | ORAL | Status: DC
  Administered 2021-06-07 – 2021-06-08 (×3): 10 mg via ORAL
  Filled 2021-06-07 (×3): qty 1

## 2021-06-07 MED ORDER — DARBEPOETIN ALFA 150 MCG/0.3ML IJ SOSY
PREFILLED_SYRINGE | INTRAMUSCULAR | Status: AC
  Filled 2021-06-07: qty 0.3

## 2021-06-07 MED ORDER — LIDOCAINE HCL URETHRAL/MUCOSAL 2 % EX GEL: 1.0000 "application " | CUTANEOUS | Status: DC | PRN

## 2021-06-07 NOTE — Progress Notes (Signed)
Physical Therapy Discharge Summary  Patient Details  Name: Maxwell Harmon MRN: [id number] Date of Birth: [birth year]  Today's Date: 06/07/2021  Patient has met 3 of 5 long term goals due to improved activity tolerance, improved balance, improved postural control, increased strength, decreased pain, and ability to compensate for deficits.  Patient to discharge at a wheelchair level Supervision.   Patient's care partner is independent to provide the necessary physical assistance at discharge. Patient's wife has completed hands-on family education and is safe to assist patient upon d/c home.  Reasons goals not met: Patient did not meet dynamic standing balance goal of Supervision as he requires min A, did not meet car transfer goal of Supervision as he requires CGA. Patient has met goals adequately for a safe d/c home and family is able to provide assist at pt's current level of function.  Recommendation:  Patient will benefit from ongoing skilled PT services in home health setting to continue to advance safe functional mobility, address ongoing impairments in endurance, strength, balance, safety, independence with functional mobility, and minimize fall risk.  Equipment: 18x18 Roho cushion  Reasons for discharge: treatment goals met and discharge from hospital  Patient/family agrees with progress made and goals achieved: Yes  PT Discharge Precautions/Restrictions Precautions Precautions: Fall Required Braces or Orthoses: Other Brace Other Brace: Limb Guard Restrictions Weight Bearing Restrictions: Yes LLE Weight Bearing: Non weight bearing Other Position/Activity Restrictions: limb protector for mobility Pain Pain Assessment Pain Score: 0-No pain Vision/Perception  Perception Perception: Within Functional Limits Praxis Praxis: Intact  Cognition Overall Cognitive Status: Within Functional Limits for tasks assessed Arousal/Alertness: Awake/alert Orientation Level: Oriented  X4 Attention: Sustained;Focused Focused Attention: Appears intact Sustained Attention: Appears intact Memory: Appears intact Awareness: Appears intact Problem Solving: Appears intact Safety/Judgment: Appears intact Sensation Sensation Light Touch: Impaired Detail Proprioception: Impaired Detail Additional Comments: impaired RLE, neuropathy Coordination Gross Motor Movements are Fluid and Coordinated: Yes Fine Motor Movements are Fluid and Coordinated: Yes Motor  Motor Motor: Within Functional Limits  Mobility Bed Mobility Bed Mobility: Rolling Right;Rolling Left;Supine to Sit;Sit to Supine Rolling Right: Independent Rolling Left: Independent Supine to Sit: Independent Sitting - Scoot to Edge of Bed: Independent Sit to Supine: Independent Transfers Transfers: Sit to [employer];Lateral/Scoot Transfers Sit to Stand: Minimal Assistance - Patient > 75% Stand Pivot Transfers: Minimal Assistance - Patient > 75% Squat Pivot Transfers: Supervision/Verbal cueing Lateral/Scoot Transfers: Supervision/Verbal cueing Locomotion  Gait Ambulation: Yes Gait Assistance: Minimal Assistance - Patient > 75% Gait Distance (Feet): 15 Feet Assistive device: Rolling walker Gait Assistance Details: Verbal cues for technique;Verbal cues for precautions/safety;Verbal cues for gait pattern;Verbal cues for safe use of DME/AE Gait Gait: Yes Gait Pattern: Impaired Gait Pattern:  (hopping pattern due to L BKA) Gait velocity: decreased Stairs / Additional Locomotion Stairs: No Ramp: Supervision/Verbal Location manager Mobility: Yes Wheelchair Assistance: Independent with Camera operator: Both upper extremities Wheelchair Parts Management: Independent Distance: 150  Trunk/Postural Assessment  Cervical Assessment Cervical Assessment: Within Functional Limits Thoracic Assessment Thoracic Assessment: Within  Functional Limits Lumbar Assessment Lumbar Assessment: Within Functional Limits Postural Control Postural Control: Within Functional Limits  Balance Balance Balance Assessed: Yes Static Sitting Balance Static Sitting - Balance Support: Feet supported Static Sitting - Level of Assistance: 6: Modified independent (Device/Increase time) Dynamic Sitting Balance Dynamic Sitting - Balance Support: During functional activity Dynamic Sitting - Level of Assistance: 5: Stand by assistance Extremity Assessment   RLE Assessment RLE Assessment: Within Functional Limits General Strength Comments:  4 to 5/5 grossly LLE Assessment LLE Assessment: Exceptions to [facility] General Strength Comments: L BKA; hip flex 5/5     Excell Seltzer, PT, DPT, CSRS 06/07/2021, 5:20 PM

## 2021-06-07 NOTE — Progress Notes (Signed)
[facility] Progress Note   Outpatient orders: [facility], MWF, F180, 3hrs, 2k, 2.5cal, 137 na, 35bicarb, 400bfr, 16g, edw 86.5kg. hep 2k bolus  Assessment/ Plan:   ESRD: has been on HD due to issues with CCPD: inadequate drainage/UF (catheter malfunction?)  - no plans for further PD and is being scheduled to have PD cath removed as an outpatient, therefore no PD cath flushing needed.  Dressing to be changed daily - RN will facilitate;  -HD MWF, continue with volume removal as able.  Continue per schedule  -7/13 AVF infiltration thought to be due to arm moved - no further issues.    Cellulitis/diabetic foot, progressive gangrene Left transtibial amputation  7/8. [name]. [name]   Volume/ hypertension: EDW 86.5kg pre amputation.  Volume status improved.  Continue with volume removal on dialysis   Anemia of Chronic Kidney Disease: Hemoglobin 8.0. No active infection so gave 0.5g IV iron load over 4 HD treatments. On aranesp here. Transfuse prn-  getting labs only on HD. Darbo 150 (7/28)   Secondary Hyperparathyroidism/Hyperphosphatemia: stopped auryxia dose with phos running on low side (3.3 on 7/25)    -  also on calcitriol, sensipar- getting labs only with HD -Continue to follow phosphorus   DM2 w/ hyperglycemia -mgmt per primary service   Hypoalbuminemia, protein calorie malnutrition -push protein  Overall no changes to plan as above.  Possibly leaving tomorrow.  Dialysis today Subjective:   Patient feels well today without any complaints.  Dialysis today   Objective:   BP 102/65 (BP Location: Right Arm)   Pulse 94   Temp 99.1 F (37.3 C)   Resp 17   Ht 5\' 11"  (1.803 m)   Wt 86.2 kg   SpO2 97%   BMI 26.50 kg/m   Intake/Output Summary (Last 24 hours) at 06/07/2021 1007 Last data filed at 06/07/2021 0751 Gross per 24 hour  Intake 240 ml  Output 600 ml  Net -360 ml   Weight change:   Physical Gen:nad, nontoxic appearing CVS: Normal rate Resp:normal  wob on RA  Ext: Trace edema in the right lower extremity, left lower extremity amputated Neuro: somnolent but arousable Access: lt BCF  +b/t, swollen but no active extravasation.  Imaging: No results found.  Labs: BMET Recent Labs  Lab 06/01/21 1419 06/02/21 1325 06/05/21 1528  NA 129* 132* 130*  K 3.9 3.7 5.0  CL 94* 97* 95*  CO2 23 24 23   GLUCOSE 144* 117* 158*  BUN 71* 38* 53*  CREATININE 6.36* 4.80* 6.11*  CALCIUM 8.8* 8.8* 8.9  PHOS 4.5 3.3 4.6   CBC Recent Labs  Lab 06/01/21 1419 06/02/21 1325 06/05/21 1528  WBC 4.6 4.5 5.7  HGB 8.4* 9.1* 9.4*  HCT 27.1* 29.0* 30.0*  MCV 105.9* 105.1* 106.0*  PLT 160 162 159    Medications:     aspirin EC  81 mg Oral Daily   atorvastatin  80 mg Oral Daily   bethanechol  10 mg Oral TID   calcitRIOL  0.25 mcg Oral BID   Chlorhexidine Gluconate Cloth  6 each Topical Q0600   cinacalcet  30 mg Oral Q Mon   clopidogrel  75 mg Oral Daily   darbepoetin ([name]) injection - DIALYSIS  150 mcg Intravenous Q Wed-HD   feeding supplement (NEPRO CARB STEADY)  237 mL Oral QHS   furosemide  80 mg Oral BID   gabapentin  300 mg Oral QHS   gentamicin cream   Topical Daily   heparin  5,000 Units Subcutaneous Q8H   insulin aspart  0-6 Units Subcutaneous TID WC   insulin glargine  5 Units Subcutaneous QHS   linaclotide  290 mcg Oral QAC breakfast   midodrine  10 mg Oral Q M,W,F-HD   multivitamin  1 tablet Oral QHS   nutrition supplement (JUVEN)  1 packet Oral BID BM   oxyCODONE-acetaminophen  1 tablet Oral QHS   pantoprazole  40 mg Oral Daily   pentoxifylline  400 mg Oral Q breakfast   polyethylene glycol  17 g Oral Daily   QUEtiapine  50 mg Oral QHS   senna-docusate  2 tablet Oral BID   tamsulosin  0.8 mg Oral QHS    Shaune Pollack Renee Erb  06/07/2021, 10:07 AM

## 2021-06-07 NOTE — Progress Notes (Signed)
Physical Therapy Session Note  Patient Details  Name: Maxwell Harmon MRN: [id number] Date of Birth: 30-Aug-1956  Today's Date: 06/07/2021 PT Individual Time: 0915-1000; 1100-1200 PT Individual Time Calculation (min): 45 min and 60 min  Short Term Goals: Week 2:  PT Short Term Goal 1 (Week 2): =LTG due to ELOS  Skilled Therapeutic Interventions/Progress Updates:    Session 1: Pt received seated in w/c in room, agreeable to PT session. Pt reports some pain in L residual limb and in sacrum, premedicated prior to start of therapy session and declines further intervention. Pt is at mod I level for w/c mobility and management throughout session. Car transfer via SB with CGA to 25" height to simulate car patient will d/c home in. Sit to stand with CGA to RW. Ambulation 2 x 15 ft with RW and min A for balance, improved clearance of RLE during gait as well as improved balance and increased endurance/tolerance for gait training with increased distance ambulated this date. Pt left seated in w/c in room with needs in reach at end of session.  Session 2: Pt received seated in w/c in room, agreeable to PT session. No complaints of pain. Pt reports his wife has w/c leg rests and residual limb support at home, just waiting on Roho cushion and DABSC to be delivered, CSW notified. Squat pivot transfer w/c to/from 26" bed in rehab apartment at Supervision level this date. Pt then found to be bleeding from site of heparin shot and brief and shorts soiled. Transferred back to bed in similar manner. Assisted pt with dressing change and brief change, nursing notified. Pt independent for bed mobility. Sit to stand x 5 reps from EOB to RW with CGA. Standing balance reaching outside BOS and across midline with alt UE, min A needed for balance. Pt left seated in bed with needs in reach, bed alarm in place at end of session.   Therapy Documentation Precautions:  Precautions Precautions: Fall Precaution Comments: charcot  foot, wound VAC, LLE NWB Required Braces or Orthoses: Other Brace Other Brace: Limb Guard Restrictions Weight Bearing Restrictions: Yes LLE Weight Bearing: Non weight bearing Other Position/Activity Restrictions: limb protector for mobility   Therapy/Group: Individual Therapy   Excell Seltzer, PT, DPT, CSRS  06/07/2021, 5:12 PM

## 2021-06-07 NOTE — Progress Notes (Signed)
Patient ID: Maxwell Harmon, male   DOB: 10-24-1956, 65 y.o.   MRN: [id number]  SW spoke with Pam/Dialysis Coord covering for Marya Amsler [phone]) to inform on pt discharge and to ensure no issues with pt returning to dialysis clinic. SW waiting on follow-up.  SW advised by PT pt will need roho cushion. SW clarifying with medical team stage of wound.   Update from Pam/dialysis coord stated pt is all set to return to his dialysis clinic, and should arrive at 11:45am on Friday.   SW spoke with [facility] [employer] ([phone])- to discuss referral. Referral accepted.  SW called [name] [phone]) to provide above updates. SW informed on roho cushion being ordered and waiting to get updates from medical team if cushion can be supported for insurance to cover based on stage of wound. SW informed will follow-up.SW informed on cathter referral being sent to [facility] and will f/u with her husband about his preference since pt off unit due to dialysis.    Loralee Pacas, MSW, [place] Office: [phone] Cell: [phone] Fax: [phone]

## 2021-06-07 NOTE — Progress Notes (Signed)
Patient picked up for by transport to dialysis via bed. Report given to [facility]. Vitals signs recorded and placed in Epic. Sanda Linger, LPN

## 2021-06-07 NOTE — Progress Notes (Signed)
Educated and assisted with urethral catheterization last night(aseptic technique) with minimal success.Patient understand the technique but had a bit of discomfort during advancing the catheter.. I tried but could not advance the catheter. Called Another RN to assisted and upon manipulation, succeeded in draining 300 mls of concentrated urine. Will continue to educate  an encourage self - catheterization with minimal assistance.

## 2021-06-07 NOTE — Progress Notes (Signed)
Occupational Therapy Session Note  Patient Details  Name: Maxwell Harmon MRN: [id number] Date of Birth: 07-Jun-1956  Today's Date: 06/07/2021 OT Individual Time: 0700-0830 OT Individual Time Calculation (min): 90 min    Short Term Goals: Week 3:  OT Short Term Goal 1 (Week 3): STGs = LTGs at Supervision  Skilled Therapeutic Interventions/Progress Updates:    OT intervention with focus on bed mobility, squat pivot transfers, stand pivot tranfsers, bathing at shower level, dressing with sit<>stand from w/c at sink, discharge planning, and safety awareness to increase independence with BADLs and prepare for discharge home tomorrow. Squat pivot transfers with CGA. Sit<>stand from w/c at sink with supervision. Min A for pulling pants over hips. Stand pivot tranfsers to TTB using grab bars with CGA. Pt required assistance donning limb guard. Pt pleased with progress and looking forward to discharge home tomorrow.   Therapy Documentation Precautions:  Precautions Precautions: Fall Precaution Comments: charcot foot, wound VAC, LLE NWB Required Braces or Orthoses: Other Brace Other Brace: Limb Guard Restrictions Weight Bearing Restrictions: Yes LLE Weight Bearing: Non weight bearing Other Position/Activity Restrictions: limb protector for mobility   Pain: Pain Assessment Pain Scale: 0-10 Pain Score: 0-No pain    Therapy/Group: Individual Therapy  Leroy Libman 06/07/2021, 10:56 AM

## 2021-06-07 NOTE — Progress Notes (Signed)
Recreational Therapy Discharge Summary Patient Details  Name: Maxwell Harmon MRN: [id number] Date of Birth: 06/29/1956 Today's Date: 06/07/2021  Long term goals set: 1  Long term goals met: 1  Comments on progress toward goals: Pt has made good progress during LOS and is discharging home tomorrow with wife to provide/coordinate supervision/assistance once home.  TR sessions focused on leisure education, activity analysis identifying potential modifications, stress exploration and coping strategies.  Pt can verbalize and utilize discussed strategies but requires verbal cueing for encouragement/emotional support.      Reasons for discharge: discharge from hospitalPatient/family agrees with progress made and goals achieved: Yes  Kathyrn Warmuth 06/07/2021, 9:50 AM

## 2021-06-07 NOTE — Progress Notes (Signed)
PROGRESS NOTE   Subjective/Complaints:  Says that sleeping meds/pain meds seemed to work last night although the bladder emptying kept him up longer than he wanted to be. Pt had difficulty with home cathing kit per his account. Couldn't get catheter to bladder. Another nurse assisted with ?another cath with success  ROS: Patient denies fever, rash, sore throat, blurred vision, nausea, vomiting, diarrhea, cough, shortness of breath or chest pain,   headache, or mood change.         Objective:   No results found. Recent Labs    06/05/21 1528  WBC 5.7  HGB 9.4*  HCT 30.0*  PLT 159     Recent Labs    06/05/21 1528  NA 130*  K 5.0  CL 95*  CO2 23  GLUCOSE 158*  BUN 53*  CREATININE 6.11*  CALCIUM 8.9      Intake/Output Summary (Last 24 hours) at 06/07/2021 0312 Last data filed at 06/07/2021 0751 Gross per 24 hour  Intake 240 ml  Output 600 ml  Net -360 ml     Pressure Injury 05/18/21 Heel Right Stage 2 -  Partial thickness loss of dermis presenting as a shallow open injury with a red, pink wound bed without slough. small area on heel, covered with foam (Active)  05/18/21 2200  Location: Heel  Location Orientation: Right  Staging: Stage 2 -  Partial thickness loss of dermis presenting as a shallow open injury with a red, pink wound bed without slough.  Wound Description (Comments): small area on heel, covered with foam  Present on Admission: Yes    Physical Exam: Vital Signs Blood pressure 102/65, pulse 94, temperature 99.1 F (37.3 C), resp. rate 17, height '5\' 11"'  (1.803 m), weight 86.2 kg, SpO2 97 %.   Constitutional: No distress . Vital signs reviewed. HEENT: EOMI, oral membranes moist Neck: supple Cardiovascular: RRR without murmur. No JVD    Respiratory/Chest: CTA Bilaterally without wheezes or rales. Normal effort    GI/Abdomen: BS +, non-tender, non-distended Ext: no clubbing, cyanosis,  or edema Psych: pleasant a little anxious  Musculoskeletal:    Cervical back: Normal range of motion and neck supple.    Comments: Right heel boggy and red.dressed- not visualized today R foot- Charcot food- has no arch- no actual skin broken down- scabs R calf scattered LBK incision clean and intact/healing without drainage- Also has B/L stage II inner buttocks pressure ulcers-   L BKA has shrinker on it-   UE 5/5 B/L in biceps, triceps, WE< grip and FA Les- RLE- HF 4+/5, KF/KE 4/5, DF and PF 4+/5 LLE- HF and KE at least 3+/5- no pain with movement today  Skin: see above Neurological:    Mental Status: He is alert and oriented to person, place, and time.    Comments: Decreased to absent Sensation to light touch from R knee downwards     Assessment/Plan: 1. Functional deficits which require 3+ hours per day of interdisciplinary therapy in a comprehensive inpatient rehab setting. Physiatrist is providing close team supervision and 24 hour management of active medical problems listed below. Physiatrist and rehab team continue to assess barriers to  discharge/monitor patient progress toward functional and medical goals  Care Tool:  Bathing    Body parts bathed by patient: Right arm, Chest, Left arm, Abdomen, Front perineal area, Face   Body parts bathed by helper: Right lower leg Body parts n/a: Left lower leg   Bathing assist Assist Level: Moderate Assistance - Patient 50 - 74%     Upper Body Dressing/Undressing Upper body dressing   What is the patient wearing?: Pull over shirt    Upper body assist Assist Level: Minimal Assistance - Patient > 75%    Lower Body Dressing/Undressing Lower body dressing      What is the patient wearing?: Pants, Underwear/pull up     Lower body assist Assist for lower body dressing: Maximal Assistance - Patient 25 - 49%     Toileting Toileting    Toileting assist Assist for toileting: Maximal Assistance - Patient 25 - 49%      Transfers Chair/bed transfer  Transfers assist     Chair/bed transfer assist level: Contact Guard/Touching assist Chair/bed transfer assistive device: Sliding board   Locomotion Ambulation   Ambulation assist   Ambulation activity did not occur: Safety/medical concerns  Assist level: Minimal Assistance - Patient > 75% Assistive device: Walker-rolling Max distance: 10'   Walk 10 feet activity   Assist  Walk 10 feet activity did not occur: Safety/medical concerns  Assist level: Minimal Assistance - Patient > 75% Assistive device: Walker-rolling   Walk 50 feet activity   Assist Walk 50 feet with 2 turns activity did not occur: Safety/medical concerns         Walk 150 feet activity   Assist Walk 150 feet activity did not occur: Safety/medical concerns         Walk 10 feet on uneven surface  activity   Assist Walk 10 feet on uneven surfaces activity did not occur: Safety/medical concerns         Wheelchair     Assist Will patient use wheelchair at discharge?: Yes Type of Wheelchair: Manual    Wheelchair assist level: Independent Max wheelchair distance: 150'    Wheelchair 50 feet with 2 turns activity    Assist        Assist Level: Independent   Wheelchair 150 feet activity     Assist      Assist Level: Independent   Blood pressure 102/65, pulse 94, temperature 99.1 F (37.3 C), resp. rate 17, height '5\' 11"'  (1.803 m), weight 86.2 kg, SpO2 97 %.  Medical Problem List and Plan: 1.  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  -Continue [name] therapies including PT, OT  2.  Impaired mobility -DVT/anticoagulation:  Pharmaceutical: Continue Heparin             -antiplatelet therapy: ASA/Plavix 3. Pain Management:   .             --gabapentin for phantom limb  pain  -scheduled percocet at night, prn oxy for breakthrough pain -prn skelaxin  4. Mood: LCSW to follow for evaluation and support.               -antipsychotic agents: N/A 5. Neuropsych: This  patient is capable of making decisions on his own behalf. 6. Skin/Wound Care: Continue wound VAC             --On Zinc and Juven to help promote wound healing.  7/16- remove VAC- is due today-   7/17- has 1 blister, but looks OK- will monitor  7/21- skin looks better- dried blood- no erythema seen- con't shrinker- shaping w  7/22- had foam added for rubbing raw on back of posterior L thigh- will monitor  7/25- Has new B/L inner buttocks wounds/pressure ulcers- uncomfortable- asked nursing to turn him to get off backside. - pt encouraged to turn/weight shift 7. Fluids/Electrolytes/Nutrition: Strict I/O with daily weights. 8. R -charcot foot: Encourage wearing CAM boot with activity. 9. T2DM: Hgb A1C-8.2. Monitor BS ac/hs             --continue Lantus 5 units with SSI for elevated BS  CBG (last 3)  Recent Labs    06/06/21 1651 06/06/21 2059 06/07/21 0535  GLUCAP 168* 184* 95   7/30: hypoglycemic to 51: decrease Lantus to 6U  8/1 lantus decreased to 5 units daily. ---fair control 8/3 10. ESRD: HD MWF at the end of the day to facilitate therapy and endurance issues             --No plans to resume PD. Midodrine for BP support in hemo.  7/20- K+ 5.2 today- per renal- also got approval from renal to increase Flomax 7/21- Per renal for K+ 5.2.  Managed by nephro 7/25- Na 129- per renal 7/29- Na up to 132- con't per renal 11. CAD w/recent NSTEMI: Treated medically w/ASA, Plavix and Lipitor.   12. Acute on chronic anemia: Loaded with IV ferric gluconate --continue [name] 150 mc weekly with serial CBC to monitor for stability . 13. PAD: On trental, plavix and ASA. 14. Chronic constipation: Continue Sorbitol bid.   7/28- LBM yesterday however pt insists it was Monday- asked for more Sorbitol- will  increase Senokot to 2 tabs BID and add Miralax daily/increase Linzess to 290 mcg daily and stop scheduled Sorbitol.   8/3- stools regular now 15. Hyperparathyroidism: Being managed with aruxia and Calcitriol.  16. Insomnia/RLS: Resume gabapentin for phantom pain as well as RLS.        8/3 - did fairly well with 67m seroquel at 9pm with percocet 550m--won't adjust timing 17 H/o  Depression/anxiety: Continue Lexapro (started a few weeks ago) and klonopin.  7/16- will decrease Klonopin to 0.25 mg BID prn since too sleepy/dazed this AM  7/17- d/c lexapro per pt request.  18. Cough    CXR shows likely atelectasis since WBC is normal and afebrile.      1993Restart meds for hyperparathyroidism-  2081Oliguria/Urinary retention  Still req I/O caths.  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    LOS: 20 days A FACE TO FACE EVALUATION WAS PERFORMED  ZacMeredith Staggers3/2022, 8:38 AM

## 2021-06-07 NOTE — Progress Notes (Signed)
Occupational Therapy Discharge Summary  Patient Details  Name: Maxwell Harmon MRN: [id number] Date of Birth: 1956-05-11    Patient has met 5 of 5 long term goals due to improved activity tolerance, improved balance, ability to compensate for deficits, and improved coordination.  Pt made steady progress with BADLs and functional tranfsers during this admission. Pt completes bathing with supervision at shower level and bed level. Pt requires min A for LB dressing tasks and toileting tasks. SB transfers with supervision and toilet tranfsers with CGA. Pt's wife has been present for education. Pt pleased with progress and ready for discharge home. Patient to discharge at [facility] Assist level.  Patient's care partner is independent to provide the necessary physical assistance at discharge.    Recommendation:  Patient will benefit from ongoing skilled OT services in home health setting to continue to advance functional skills in the area of BADL and Reduce care partner burden.  Equipment: DABSC  Reasons for discharge: treatment goals met and discharge from hospital  Patient/family agrees with progress made and goals achieved: Yes  OT Discharge Precautions/Restrictions  Restrictions Weight Bearing Restrictions: Yes LLE Weight Bearing: Non weight bearing Vision Baseline Vision/History: Wears glasses Wears Glasses: At all times Patient Visual Report: No change from baseline Vision Assessment?: No apparent visual deficits Perception  Perception: Within Functional Limits Praxis Praxis: Intact Cognition Overall Cognitive Status: Within Functional Limits for tasks assessed Arousal/Alertness: Awake/alert Orientation Level: Oriented X4 Attention: Sustained;Focused Focused Attention: Appears intact Sustained Attention: Appears intact Memory: Appears intact Awareness: Appears intact Problem Solving: Appears intact Safety/Judgment: Appears intact Sensation Sensation Light Touch:  Appears Intact Hot/Cold: Appears Intact Proprioception: Appears Intact Additional Comments: RLE neuropathy, R foot affected Coordination Gross Motor Movements are Fluid and Coordinated: Yes Fine Motor Movements are Fluid and Coordinated: Yes Motor  Motor Motor: Within Functional Limits Trunk/Postural Assessment  Cervical Assessment Cervical Assessment: Within Functional Limits Thoracic Assessment Thoracic Assessment: Within Functional Limits Lumbar Assessment Lumbar Assessment: Within Functional Limits Postural Control Postural Control: Within Functional Limits  Balance Static Sitting Balance Static Sitting - Balance Support: Feet supported Static Sitting - Level of Assistance: 6: Modified independent (Device/Increase time) Dynamic Sitting Balance Dynamic Sitting - Balance Support: During functional activity Dynamic Sitting - Level of Assistance: 5: Stand by assistance Extremity/Trunk Assessment RUE Assessment RUE Assessment: Within Functional Limits LUE Assessment LUE Assessment: Within Functional Limits General Strength Comments: 4-/5 overall   Leroy Libman 06/07/2021, 12:28 PM

## 2021-06-07 NOTE — Plan of Care (Signed)
  Problem: Consults Goal: RH LIMB LOSS PATIENT EDUCATION Description: Description: See Patient Education module for eduction specifics. Outcome: Progressing Goal: Skin Care Protocol Initiated - if Braden Score 18 or less Description: If consults are not indicated, leave blank or document N/A Outcome: Progressing Goal: Diabetes Guidelines if Diabetic/Glucose > 140 Description: If diabetic or lab glucose is > 140 mg/dl - Initiate Diabetes/Hyperglycemia Guidelines & Document Interventions  Outcome: Progressing   Problem: RH SKIN INTEGRITY Goal: RH STG MAINTAIN SKIN INTEGRITY WITH ASSISTANCE Description: STG Maintain Skin Integrity With Supervision Assistance. Outcome: Progressing   Problem: RH SAFETY Goal: RH STG ADHERE TO SAFETY PRECAUTIONS W/ASSISTANCE/DEVICE Description: STG Adhere to Safety Precautions With Supervision Assistance/Device. Outcome: Progressing   Problem: RH PAIN MANAGEMENT Goal: RH STG PAIN MANAGED AT OR BELOW PT'S PAIN GOAL Description: < 3 on a 0-10 pain scale. Outcome: Progressing   Problem: RH KNOWLEDGE DEFICIT LIMB LOSS Goal: RH STG INCREASE KNOWLEDGE OF SELF CARE AFTER LIMB LOSS Description: Patient will be able to demonstrate knowledge of medication management, pain management, skin/wound care, residual limb care, and weight bearing precautions with educational materials and handouts provided by staff independently at discharge. Outcome: Progressing

## 2021-06-08 LAB — GLUCOSE, CAPILLARY: Glucose-Capillary: 84 mg/dL (ref 70–99)

## 2021-06-08 MED ORDER — BETHANECHOL CHLORIDE 25 MG PO TABS: 25.0000 mg | ORAL_TABLET | Freq: Three times a day (TID) | ORAL | 0 refills | Status: DC

## 2021-06-08 MED ORDER — LINACLOTIDE 290 MCG PO CAPS: 290.0000 ug | ORAL_CAPSULE | Freq: Every day | ORAL | 0 refills | Status: DC

## 2021-06-08 MED ORDER — FUROSEMIDE 80 MG PO TABS: 80.0000 mg | ORAL_TABLET | Freq: Two times a day (BID) | ORAL | 0 refills | Status: DC

## 2021-06-08 MED ORDER — TRAMADOL HCL 50 MG PO TABS: 50.0000 mg | ORAL_TABLET | Freq: Four times a day (QID) | ORAL | 0 refills | Status: DC | PRN

## 2021-06-08 MED ORDER — RENA-VITE PO TABS: 1.0000 | ORAL_TABLET | Freq: Every day | ORAL | 0 refills | Status: DC

## 2021-06-08 MED ORDER — CLONAZEPAM 0.25 MG PO TBDP: 0.2500 mg | ORAL_TABLET | Freq: Two times a day (BID) | ORAL | 0 refills | Status: DC | PRN

## 2021-06-08 MED ORDER — CLOPIDOGREL BISULFATE 75 MG PO TABS: 75.0000 mg | ORAL_TABLET | Freq: Every day | ORAL | 0 refills | Status: DC

## 2021-06-08 MED ORDER — MIDODRINE HCL 10 MG PO TABS: 5.0000 mg | ORAL_TABLET | ORAL | 0 refills | Status: DC

## 2021-06-08 MED ORDER — METAXALONE 400 MG PO TABS: 400.0000 mg | ORAL_TABLET | Freq: Three times a day (TID) | ORAL | 0 refills | Status: DC | PRN

## 2021-06-08 MED ORDER — OXYCODONE-ACETAMINOPHEN 5-325 MG PO TABS: 1.0000 | ORAL_TABLET | Freq: Three times a day (TID) | ORAL | 0 refills | Status: DC | PRN

## 2021-06-08 MED ORDER — QUETIAPINE FUMARATE 50 MG PO TABS: 50.0000 mg | ORAL_TABLET | Freq: Every day | ORAL | 0 refills | Status: DC

## 2021-06-08 MED ORDER — CALCITRIOL 0.25 MCG PO CAPS: 0.2500 ug | ORAL_CAPSULE | Freq: Two times a day (BID) | ORAL | 0 refills | Status: DC

## 2021-06-08 MED ORDER — ATORVASTATIN CALCIUM 80 MG PO TABS: 80.0000 mg | ORAL_TABLET | Freq: Every day | ORAL | 0 refills | Status: DC

## 2021-06-08 MED ORDER — BETHANECHOL CHLORIDE 25 MG PO TABS: 25.0000 mg | ORAL_TABLET | Freq: Three times a day (TID) | ORAL | Status: DC

## 2021-06-08 MED ORDER — GABAPENTIN 300 MG PO CAPS
300.0000 mg | ORAL_CAPSULE | Freq: Every day | ORAL | 0 refills | Status: DC
Start: 2021-06-08 — End: 2021-07-08

## 2021-06-08 MED ORDER — SENNOSIDES-DOCUSATE SODIUM 8.6-50 MG PO TABS: 2.0000 | ORAL_TABLET | Freq: Two times a day (BID) | ORAL | 0 refills | Status: DC

## 2021-06-08 MED ORDER — CINACALCET HCL 30 MG PO TABS: 30.0000 mg | ORAL_TABLET | ORAL | 0 refills | Status: DC

## 2021-06-08 MED ORDER — TAMSULOSIN HCL 0.4 MG PO CAPS: 0.8000 mg | ORAL_CAPSULE | Freq: Every day | ORAL | 0 refills | Status: DC

## 2021-06-08 MED ORDER — POLYETHYLENE GLYCOL 3350 17 G PO PACK: 17.0000 g | PACK | Freq: Every day | ORAL | 0 refills | Status: DC | PRN

## 2021-06-08 NOTE — Progress Notes (Signed)
Inpatient Rehabilitation Care Coordinator Discharge Note  The overall goal for the admission was met for:   Discharge location: D/c to home with support from his wife.   Length of Stay: 20 days.   Discharge activity level: Supervision at w/c level.   Home/community participation: Yes. Limited  Services provided included: MD, RD, PT, OT, SLP, RN, CM, TR, Pharmacy, Neuropsych, and SW  Financial Services: Medicare and [employer]  Choices offered to/list presented to:Yes  Follow-up services arranged: Home Health: [facility] for HHPT/OT/SN/aide, Other: DME- [employer] [facility] for r amputee pad, transfer board and DABSC, and Patient/Family request agency HH: [facility], DME: N/A  Comments (or additional information):  Patient/Family verbalized understanding of follow-up arrangements: Yes  Individual responsible for coordination of the follow-up plan: contact pt wife Angie #[phone]  Confirmed correct DME delivered:  A  06/08/2021     A  

## 2021-06-08 NOTE — Progress Notes (Signed)
PROGRESS NOTE   Subjective/Complaints:  Says that sleeping meds/pain meds seemed to work last night although the bladder emptying kept him up longer than he wanted to be. Pt had difficulty with home cathing kit per his account. Couldn't get catheter to bladder. Another nurse assisted with ?another cath with success  ROS: Patient denies fever, rash, sore throat, blurred vision, nausea, vomiting, diarrhea, cough, shortness of breath or chest pain,   headache, or mood change.         Objective:   No results found. Recent Labs    06/05/21 1528 06/07/21 1203  WBC 5.7 10.1  HGB 9.4* 9.9*  HCT 30.0* 31.6*  PLT 159 163     Recent Labs    06/05/21 1528 06/07/21 1203  NA 130* 131*  K 5.0 4.3  CL 95* 94*  CO2 23 24  GLUCOSE 158* 120*  BUN 53* 45*  CREATININE 6.11* 5.74*  CALCIUM 8.9 9.1      Intake/Output Summary (Last 24 hours) at 06/08/2021 0930 Last data filed at 06/08/2021 0700 Gross per 24 hour  Intake 320 ml  Output 3400 ml  Net -3080 ml     Pressure Injury 05/18/21 Heel Right Stage 2 -  Partial thickness loss of dermis presenting as a shallow open injury with a red, pink wound bed without slough. small area on heel, covered with foam (Active)  05/18/21 2200  Location: Heel  Location Orientation: Right  Staging: Stage 2 -  Partial thickness loss of dermis presenting as a shallow open injury with a red, pink wound bed without slough.  Wound Description (Comments): small area on heel, covered with foam  Present on Admission: Yes    Physical Exam: Vital Signs Blood pressure 106/60, pulse 93, temperature 98.9 F (37.2 C), temperature source Oral, resp. rate 19, height '5\' 11"'  (1.803 m), weight 83.6 kg, SpO2 96 %.   Constitutional: No distress . Vital signs reviewed. HEENT: EOMI, oral membranes moist Neck: supple Cardiovascular: RRR without murmur. No JVD    Respiratory/Chest: CTA Bilaterally  without wheezes or rales. Normal effort    GI/Abdomen: BS +, non-tender, non-distended Ext: no clubbing, cyanosis, or edema Psych: pleasant a little anxious  Musculoskeletal:    Cervical back: Normal range of motion and neck supple.    Comments: Right heel boggy and red.dressed- not visualized today R foot- Charcot food- has no arch- no actual skin broken down- scabs R calf scattered LBK incision clean and intact/healing without drainage- Also has B/L stage II inner buttocks pressure ulcers-   L BKA has shrinker on it-   UE 5/5 B/L in biceps, triceps, WE< grip and FA Les- RLE- HF 4+/5, KF/KE 4/5, DF and PF 4+/5 LLE- HF and KE at least 3+/5- no pain with movement today  Skin: see above Neurological:    Mental Status: He is alert and oriented to person, place, and time.    Comments: Decreased to absent Sensation to light touch from R knee downwards     Assessment/Plan: 1. Functional deficits which require 3+ hours per day of interdisciplinary therapy in a comprehensive inpatient rehab setting. Physiatrist is providing close team supervision  and 24 hour management of active medical problems listed below. Physiatrist and rehab team continue to assess barriers to discharge/monitor patient progress toward functional and medical goals  Care Tool:  Bathing    Body parts bathed by patient: Right arm, Left arm, Chest, Abdomen, Front perineal area, Right upper leg, Left upper leg, Right lower leg, Face, Buttocks   Body parts bathed by helper: Right lower leg Body parts n/a: Left lower leg   Bathing assist Assist Level: Supervision/Verbal cueing     Upper Body Dressing/Undressing Upper body dressing   What is the patient wearing?: Pull over shirt    Upper body assist Assist Level: Independent    Lower Body Dressing/Undressing Lower body dressing      What is the patient wearing?: Pants     Lower body assist Assist for lower body dressing: Minimal Assistance - Patient > 75%      Toileting Toileting    Toileting assist Assist for toileting: Minimal Assistance - Patient > 75%     Transfers Chair/bed transfer  Transfers assist     Chair/bed transfer assist level: Supervision/Verbal cueing Chair/bed transfer assistive device:  (squat pivot)   Locomotion Ambulation   Ambulation assist   Ambulation activity did not occur: Safety/medical concerns  Assist level: Minimal Assistance - Patient > 75% Assistive device: Walker-rolling Max distance: 15'   Walk 10 feet activity   Assist  Walk 10 feet activity did not occur: Safety/medical concerns  Assist level: Minimal Assistance - Patient > 75% Assistive device: Walker-rolling   Walk 50 feet activity   Assist Walk 50 feet with 2 turns activity did not occur: Safety/medical concerns         Walk 150 feet activity   Assist Walk 150 feet activity did not occur: Safety/medical concerns         Walk 10 feet on uneven surface  activity   Assist Walk 10 feet on uneven surfaces activity did not occur: Safety/medical concerns         Wheelchair     Assist Will patient use wheelchair at discharge?: Yes Type of Wheelchair: Manual    Wheelchair assist level: Independent Max wheelchair distance: 150'    Wheelchair 50 feet with 2 turns activity    Assist        Assist Level: Independent   Wheelchair 150 feet activity     Assist      Assist Level: Independent   Blood pressure 106/60, pulse 93, temperature 98.9 F (37.2 C), temperature source Oral, resp. rate 19, height '5\' 11"'  (1.803 m), weight 83.6 kg, SpO2 96 %.  Medical Problem List and Plan: 1.  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  2.  Impaired mobility -DVT/anticoagulation:  Pharmaceutical: Continue Heparin             -antiplatelet therapy: ASA/Plavix 3. Pain Management:   .             --gabapentin for phantom limb  pain  -scheduled percocet at night, prn oxy for breakthrough pain -prn skelaxin  4. Mood: LCSW to follow for evaluation and support.               -antipsychotic agents: N/A 5. Neuropsych: This patient is capable of making decisions on his own behalf. 6. Skin/Wound Care: Continue wound VAC             --  On Zinc and Juven to help promote wound healing.  7/16- remove VAC- is due today-   7/17- has 1 blister, but looks OK- will monitor  7/21- skin looks better- dried blood- no erythema seen- con't shrinker- shaping w  7/22- had foam added for rubbing raw on back of posterior L thigh- will monitor  7/25- Has new B/L inner buttocks wounds/pressure ulcers- uncomfortable- asked nursing to turn him to get off backside. - pt encouraged to turn/weight shift 7. Fluids/Electrolytes/Nutrition: Strict I/O with daily weights. 8. R -charcot foot: Encourage wearing CAM boot with activity. 9. T2DM: Hgb A1C-8.2. Monitor BS ac/hs             --continue Lantus 5 units with SSI for elevated BS  CBG (last 3)  Recent Labs    06/07/21 1218 06/07/21 2055 06/08/21 0558  GLUCAP 108* 157* 84   7/30: hypoglycemic to 51: decrease Lantus to 6U  8/4 lantus   5 units daily. --continue 10. ESRD: HD MWF at the end of the day to facilitate therapy and endurance issues             --No plans to resume PD. Midodrine for BP support in hemo.  7/20- K+ 5.2 today- per renal- also got approval from renal to increase Flomax 7/21- Per renal for K+ 5.2.  Managed by nephro 7/25- Na 129- per renal 7/29- Na up to 132- con't per renal 11. CAD w/recent NSTEMI: Treated medically w/ASA, Plavix and Lipitor.   12. Acute on chronic anemia: Loaded with IV ferric gluconate --continue [name] 150 mc weekly with serial CBC to monitor for stability . 13. PAD: On trental, plavix and ASA. 14. Chronic constipation: Continue Sorbitol bid.   7/28- LBM yesterday however pt insists it was Monday- asked for more Sorbitol- will increase Senokot to  2 tabs BID and add Miralax daily/increase Linzess to 290 mcg daily and stop scheduled Sorbitol.   8/4- stools regular now 15. Hyperparathyroidism: Being managed with aruxia and Calcitriol.  16. Insomnia/RLS: Resume gabapentin for phantom pain as well as RLS.        8/4 - did fairly well with 85m seroquel at 9pm with percocet 556m--can continue at home 17 H/o  Depression/anxiety: Continue Lexapro (started a few weeks ago) and klonopin.  7/16- will decrease Klonopin to 0.25 mg BID prn since too sleepy/dazed this AM  7/17- d/c lexapro per pt request.  18. Cough    CXR shows likely atelectasis since WBC is normal and afebrile.      1967Restart meds for hyperparathyroidism-  2064Oliguria/Urinary retention  -Due to patient's chronic urinary retention, he will need to perform CIC up to 2xs per day with 1671foude tip catheter.  -continue flomax -increased urecholine to 25 mg tid -pt working on technique -pt has seen Dr. BorAlinda Money the past. He will contact him if bladder emptying doesn't improve.   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    LOS: 21 days A FACE TO FACCastro [place]4/2022, 9:30 AM

## 2021-06-08 NOTE — Progress Notes (Addendum)
Patient ID: Maxwell Harmon, male   DOB: 1956/02/20, 65 y.o.   MRN: [id number]  SW met with pt and pt wife in room to review discharge. Pt reported he liked 77f coloplast speedicath, however, did not get a chance to use it since he thinks he was nervous. Also reported that he was voiding on his own this morning. SW to order. Pt asked about DABSC and roho cushion. SW informed [facility] SW also explained that cushion was likely to not be covered by insurance due to stage of wound. Pt has prevena wound vac. SW encouraged them to follow-up with [employer] to discuss returning item.   SW ordered [employer] via parachute. SW sent updated documentation to see if roho cushion can be covered.   *Nursing reported pt now needs coude catheters. SW faxed urology order form with clinical notes to [employer] ([phone]/f:[phone]) and requested samples.   [employer] reported roho cushion will likely not be covered, and will reach out to pt to discuss if he would like this item.   *SW called pt [phone] discuss above [facility], and changes with catheters. Pt encouraged to call SW if any questions/concerns.   ALoralee Pacas MSW, LMacedoniaOffice: [phone]: [phone]: [phone]

## 2021-06-08 NOTE — Progress Notes (Signed)
[facility] Progress Note   Outpatient orders: [name], MWF, F180, 3hrs, 2k, 2.5cal, 137 na, 35bicarb, 400bfr, 16g, edw 86.5kg. hep 2k bolus  Assessment/ Plan:   ESRD: has been on HD due to issues with CCPD: inadequate drainage/UF (catheter malfunction?)  - no plans for further PD and is being scheduled to have PD cath removed as an outpatient, therefore no PD cath flushing needed.  Dressing to be changed daily - RN will facilitate;  -HD MWF, continue with volume removal as able.  Continue per schedule  -7/13 AVF infiltration thought to be due to arm moved - no further issues.    Cellulitis/diabetic foot, progressive gangrene Left transtibial amputation  7/8. [name]. [name]   Volume/ hypertension: EDW 86.5kg pre amputation.  Volume status improved.  Continue with volume removal on dialysis   Anemia of Chronic Kidney Disease: Hemoglobin 8.0. No active infection so gave 0.5g IV iron load over 4 HD treatments. On aranesp here. Transfuse prn-  getting labs only on HD. Darbo 150 (7/28)   Secondary Hyperparathyroidism/Hyperphosphatemia: stopped auryxia dose with phos running on low side (3.3 on 7/25)    -  also on calcitriol, sensipar- getting labs only with HD -Continue to follow phosphorus   DM2 w/ hyperglycemia -mgmt per primary service   Hypoalbuminemia, protein calorie malnutrition -push protein  Overall no changes to plan as above.  Planning to be discharged today.  Resume outpatient dialysis Subjective:   Patient tolerated dialysis with no issues yesterday.  Feels well today.   Objective:   BP 106/60 (BP Location: Right Arm)   Pulse 93   Temp 98.9 F (37.2 C) (Oral)   Resp 19   Ht 5\' 11"  (1.803 m)   Wt 83.6 kg   SpO2 96%   BMI 25.71 kg/m   Intake/Output Summary (Last 24 hours) at 06/08/2021 0954 Last data filed at 06/08/2021 0700 Gross per 24 hour  Intake 320 ml  Output 3400 ml  Net -3080 ml   Weight change:   Physical Gen:nad, nontoxic  appearing CVS: Normal rate Resp:normal wob on RA  Ext: Trace edema in the right lower extremity, left lower extremity amputated Neuro: Awake and alert Access: lt BCF  +b/t, swollen but no active extravasation.  Imaging: No results found.  Labs: BMET Recent Labs  Lab 06/01/21 1419 06/02/21 1325 06/05/21 1528 06/07/21 1203  NA 129* 132* 130* 131*  K 3.9 3.7 5.0 4.3  CL 94* 97* 95* 94*  CO2 23 24 23 24   GLUCOSE 144* 117* 158* 120*  BUN 71* 38* 53* 45*  CREATININE 6.36* 4.80* 6.11* 5.74*  CALCIUM 8.8* 8.8* 8.9 9.1  PHOS 4.5 3.3 4.6 3.7   CBC Recent Labs  Lab 06/01/21 1419 06/02/21 1325 06/05/21 1528 06/07/21 1203  WBC 4.6 4.5 5.7 10.1  HGB 8.4* 9.1* 9.4* 9.9*  HCT 27.1* 29.0* 30.0* 31.6*  MCV 105.9* 105.1* 106.0* 107.5*  PLT 160 162 159 163    Medications:     aspirin EC  81 mg Oral Daily   atorvastatin  80 mg Oral Daily   bethanechol  25 mg Oral TID   calcitRIOL  0.25 mcg Oral BID   Chlorhexidine Gluconate Cloth  6 each Topical Q0600   cinacalcet  30 mg Oral Q Mon   clopidogrel  75 mg Oral Daily   darbepoetin ([name]) injection - DIALYSIS  150 mcg Intravenous Q Wed-HD   feeding supplement (NEPRO CARB STEADY)  237 mL Oral QHS   furosemide  80  mg Oral BID   gabapentin  300 mg Oral QHS   gentamicin cream   Topical Daily   heparin  5,000 Units Subcutaneous Q8H   insulin aspart  0-6 Units Subcutaneous TID WC   insulin glargine  5 Units Subcutaneous QHS   linaclotide  290 mcg Oral QAC breakfast   midodrine  10 mg Oral Q M,W,F-HD   multivitamin  1 tablet Oral QHS   nutrition supplement (JUVEN)  1 packet Oral BID BM   oxyCODONE-acetaminophen  1 tablet Oral QHS   pantoprazole  40 mg Oral Daily   pentoxifylline  400 mg Oral Q breakfast   polyethylene glycol  17 g Oral Daily   QUEtiapine  50 mg Oral QHS   senna-docusate  2 tablet Oral BID   tamsulosin  0.8 mg Oral QHS    Maxwell Harmon  06/08/2021, 9:54 AM

## 2021-06-08 NOTE — Progress Notes (Signed)
Patient Discharge with wife at 1204pm. Patient was alert and oriented x4. Patient teaching was reinforced with self catheterization. Patient demonstrated and tolerated well. Idamae Schuller, LPN

## 2021-06-09 ENCOUNTER — Encounter (HOSPITAL_COMMUNITY): Payer: Managed Care, Other (non HMO)

## 2021-06-12 ENCOUNTER — Telehealth: Payer: Self-pay

## 2021-06-12 NOTE — Telephone Encounter (Signed)
SW received updates from [facility] reporting that they spoke with pt wife about ordering roho cushion, and she stated she found one.   Case closed to SW.

## 2021-06-13 ENCOUNTER — Encounter: Payer: Self-pay | Admitting: Physician Assistant

## 2021-06-13 ENCOUNTER — Ambulatory Visit (INDEPENDENT_AMBULATORY_CARE_PROVIDER_SITE_OTHER): Payer: Managed Care, Other (non HMO) | Admitting: Physician Assistant

## 2021-06-13 DIAGNOSIS — S88112A Complete traumatic amputation at level between knee and ankle, left lower leg, initial encounter: Secondary | ICD-10-CM

## 2021-06-13 DIAGNOSIS — Z89512 Acquired absence of left leg below knee: Secondary | ICD-10-CM

## 2021-06-13 NOTE — Progress Notes (Signed)
Office Visit Note   Patient: Maxwell Harmon           Date of Birth: 1956/02/03           MRN: [id number] Visit Date: 06/13/2021              Requested by: [name], DO [place] [address] PCP: [name], DO  Chief Complaint  Patient presents with   Left Knee - Follow-up      HPI: Patient is a pleasant 65 year old gentleman who is 5 weeks status post left below-knee amputation.  He did spend time in inpatient rehab.  He is now home with his family in [place].  Assessment & Plan: Visit Diagnoses: No diagnosis found.  Plan: Patient should do daily cleansing with antibacterial soap and water.  Apply new shrinker each day.  Prescription was provided for a prosthetic.  We will follow-up in 2 weeks  Follow-Up Instructions: No follow-ups on file.   Ortho Exam  Patient is alert, oriented, no adenopathy, well-dressed, normal affect, normal respiratory effort. Examination shows well apposed wound edges surgical staples and sutures in place.  Has quite a bit of superficial necrotic scabbing but no ascending cellulitis minimal drainage swelling overall is well controlled  Imaging: No results found. No images are attached to the encounter.  Labs: Lab Results  Component Value Date   HGBA1C 8.2 (H) 04/10/2021   HGBA1C 6.9 (H) 01/02/2019   HGBA1C 7.5 11/08/2015   REPTSTATUS 05/12/2021 FINAL 05/07/2021   CULT  05/07/2021    NO GROWTH 5 DAYS Performed at [facility], [place] [address] [place], [place] [address]      Lab Results  Component Value Date   ALBUMIN 2.8 (L) 06/07/2021   ALBUMIN 2.6 (L) 06/05/2021   ALBUMIN 2.7 (L) 06/02/2021    Lab Results  Component Value Date   MG 2.0 05/14/2021   MG 2.1 04/12/2021   MG 1.7 04/11/2021   No results found for: VD25OH  No results found for: PREALBUMIN CBC EXTENDED Latest Ref Rng & Units 06/07/2021 06/05/2021 06/02/2021  WBC 4.0 - 10.5 K/uL 10.1 5.7 4.5  RBC 4.22 - 5.81 MIL/uL 2.94(L) 2.83(L)  2.76(L)  HGB 13.0 - 17.0 g/dL 9.9(L) 9.4(L) 9.1(L)  HCT 39.0 - 52.0 % 31.6(L) 30.0(L) 29.0(L)  PLT 150 - 400 K/uL 163 159 162  NEUTROABS 1.7 - 7.7 K/uL - - -  LYMPHSABS 0.7 - 4.0 K/uL - - -     There is no height or weight on file to calculate BMI.  Orders:  No orders of the defined types were placed in this encounter.  No orders of the defined types were placed in this encounter.    Procedures: No procedures performed  Clinical Data: No additional findings.  ROS:  All other systems negative, except as noted in the HPI. Review of Systems  Objective: Vital Signs: There were no vitals taken for this visit.  Specialty Comments:  No specialty comments available.  PMFS History: Patient Active Problem List   Diagnosis Date Noted   Below-knee amputation of left lower extremity ([place]) 05/18/2021   Acute on chronic systolic (congestive) heart failure (HCC)    Ischemic ulcer of left heel (HCC)    Cellulitis 05/07/2021   Ischemic ulcer diabetic foot ([place]) 05/07/2021   Critical lower limb ischemia ([place]) 04/20/2021   Sepsis ([place]) 04/20/2021   NSTEMI (non-ST elevated myocardial infarction) ([place]) 04/20/2021   Pressure ulcer, stage 2 ([place]) 04/11/2021  Acute heart failure ([place]) 04/10/2021   Uremia 04/10/2021   Gangrene of left foot (HCC)    PAD (peripheral artery disease) ([place])    ESRD on hemodialysis ([place])    Dyspnea on exertion    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[place]) 09/02/2015   Hyperlipidemia 09/02/2015   Essential hypertension, benign 09/02/2015   Obesity due to excess calories 09/02/2015   Past Medical History:  Diagnosis Date   Anemia of chronic disease    CAD (coronary artery disease)    Diabetes mellitus without complication (HCC)    ESRD (end stage renal disease) on dialysis ([place])    MWF in [place]   History of bleeding ulcers    History of TIAs    Hypertension    NSTEMI  (non-ST elevated myocardial infarction) ([place]) 04/2021   PAD (peripheral artery disease) ([place])     Family History  Problem Relation Age of Onset   Stroke Mother    Stroke Father    Cancer Father    Cancer Brother     Past Surgical History:  Procedure Laterality Date   ABDOMINAL AORTOGRAM W/LOWER EXTREMITY Bilateral 04/14/2021   Procedure: ABDOMINAL AORTOGRAM W/LOWER EXTREMITY;  Surgeon: Elam Dutch, MD;  Location: [facility];  Service: Vascular;  Laterality: Bilateral;   AMPUTATION Left 05/12/2021   Procedure: LEFT BELOW KNEE AMPUTATION;  Surgeon: Newt Minion, MD;  Location: [facility];  Service: Orthopedics;  Laterality: Left;   CATARACT EXTRACTION W/PHACO Left 02/15/2014   Procedure: CATARACT EXTRACTION PHACO AND INTRAOCULAR LENS PLACEMENT (IOC);  Surgeon: Tonny Branch, MD;  Location: [facility];  Service: Ophthalmology;  Laterality: Left;  CDE 10.84   CATARACT EXTRACTION W/PHACO Right 12/14/2013   Procedure: CATARACT EXTRACTION PHACO AND INTRAOCULAR LENS PLACEMENT (IOC);  Surgeon: Tonny Branch, MD;  Location: [facility];  Service: Ophthalmology;  Laterality: Right;  CDE:  16.30   CHOLECYSTECTOMY  02/2013   EYE SURGERY     IR FLUORO GUIDE CV LINE RIGHT  01/05/2019   IR US GUIDE VASC ACCESS RIGHT  01/05/2019   LAPAROSCOPIC NEPHRECTOMY Right 01/08/2019   Procedure: LAPAROSCOPIC RADICAL NEPHRECTOMY;  Surgeon: Raynelle Bring, MD;  Location: [facility];  Service: Urology;  Laterality: Right;   LEFT HEART CATH AND CORS/GRAFTS ANGIOGRAPHY N/A 04/14/2021   Procedure: LEFT HEART CATH AND CORS/GRAFTS ANGIOGRAPHY;  Surgeon: Jolaine Artist, MD;  Location: [facility];  Service: Cardiovascular;  Laterality: N/A;   PENILE PROSTHESIS IMPLANT     PERIPHERAL VASCULAR INTERVENTION Left 04/19/2021   Procedure: PERIPHERAL VASCULAR INTERVENTION;  Surgeon: Cherre Robins, MD;  Location: [facility];  Service: Cardiovascular;  Laterality: Left;   TOE AMPUTATION Right 2013   [facility]    Social History   Occupational History   Not on file  Tobacco Use   Smoking status: Never   Smokeless tobacco: Never  Vaping Use   Vaping Use: Never used  Substance and Sexual Activity   Alcohol use: Yes    Comment: social drink    Drug use: No   Sexual activity: Not on file

## 2021-06-14 NOTE — Discharge Summary (Signed)
Physician Discharge Summary  Patient ID: Maxwell Harmon MRN: [id number] DOB/AGE: 64/19/1957 64 y.o.  Admit date: 05/18/2021 Discharge date: 06/08/2021  Discharge Diagnoses:  Principal Problem:   Below-knee amputation of left lower extremity ([place]) Active Problems:   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[place])   ESRD on hemodialysis ([place])   Pressure ulcer, stage 2 (HCC)   Acute on chronic systolic (congestive) heart failure (HCC)   Insomnia   Therapeutic opioid-induced constipation (OIC)   Discharged Condition: good   Significant Diagnostic Studies: DG CHEST PORT 1 VIEW  Result Date: 05/20/2021 CLINICAL DATA:  Cough and congestion. EXAM: PORTABLE CHEST 1 VIEW COMPARISON:  May 14, 2021 FINDINGS: Postsurgical changes from CABG. Calcific atherosclerotic disease and tortuosity of the aorta. Enlarged cardiac silhouette. Persistent streaky airspace opacities in the bilateral lung bases, left greater than right. Osseous structures are without acute abnormality. Soft tissues are grossly normal. IMPRESSION: 1. Persistent streaky airspace opacities in the bilateral lung bases, left greater than right. These may represent peribronchial infiltrates, atelectasis or early interstitial pulmonary edema. 2. Enlarged cardiac silhouette. Electronically Signed   By: Fidela Salisbury M.D.   On: 05/20/2021 12:42    Labs:  Basic Metabolic Panel: BMP Latest Ref Rng & Units 06/07/2021 06/05/2021 06/02/2021  Glucose 70 - 99 mg/dL 120(H) 158(H) 117(H)  BUN 8 - 23 mg/dL 45(H) 53(H) 38(H)  Creatinine 0.61 - 1.24 mg/dL 5.74(H) 6.11(H) 4.80(H)  Sodium 135 - 145 mmol/L 131(L) 130(L) 132(L)  Potassium 3.5 - 5.1 mmol/L 4.3 5.0 3.7  Chloride 98 - 111 mmol/L 94(L) 95(L) 97(L)  CO2 22 - 32 mmol/L '24 23 24  ' Calcium 8.9 - 10.3 mg/dL 9.1 8.9 8.8(L)     CBC: CBC Latest Ref Rng & Units 06/07/2021 06/05/2021 06/02/2021  WBC 4.0 - 10.5 K/uL 10.1 5.7 4.5  Hemoglobin 13.0 - 17.0 g/dL 9.9(L)  9.4(L) 9.1(L)  Hematocrit 39.0 - 52.0 % 31.6(L) 30.0(L) 29.0(L)  Platelets 150 - 400 K/uL 163 159 162     CBG: Recent Labs  Lab 06/07/21 0535 06/07/21 1218 06/07/21 2055 06/08/21 0558  GLUCAP 95 108* 157* 84    Brief HPI:   [name] is a 65 y.o. male with history of HTN, T2DM. CAD s/p CABG, recent NSTEMI, ESRD with failure of PD, severe PAD s/p angioplasty with chronic lfet foot ulcer who was readmitted on 05/07/21 with worsening of foot ulcer with pain and drainage.  He failed attempts at limb salvage and underwent left BKA by Dr. Sharol Given on 07/08.  Hospital course significant for issues with acute blood loss anemia treated with 1 units PRBC, issues with hypotension requiring midodrine for BP support, aAF infiltration due to arm movement during dialysis as well as fluid overload.  As fluid status optimized with hemodialysis he has had improvement in symptoms however continued to be limited by weakness, fatigue as well as balance deficits.  [name] was recommended due to functional decline   Hospital Course: NICKALUS THORNSBERRY was admitted to rehab 05/18/2021 for inpatient therapies to consist of PT and OT at least three hours five days a week. Past admission physiatrist, therapy team and rehab RN have worked together to provide customized collaborative inpatient rehab.  Blood pressures were monitored on TID basis is stable with midodrine used to help tolerate HD. His mood has been stable and Lexapro was d/c per patient request. Klonopin was decreased to prevent sedative SE and Seroquel was added at bedtime to help with anxiety as  well as insomnia.    Diabetes has been monitored with ac/hs CBG checks and SSI was use prn for tighter BS control.  He reported decrease/difficulty with voiding He was found to have volumes up to 400 cc intermittently.  He has been voiding small amounts and he was educated on cathing once a day in the evening if not voided during the day. His right heel was noted to be  boggy and pressure-relief measures were encouraged. Wound VAC was removed on 07/16 and left BKA incision was healing well with sutures and staples intact and no signs of infection.  Gabapentin was titrated to help manage neuropathic pain and oxycodone has been used on as needed basis for severe pain.  OIC has been managed with adjustment  Hemodialysis was ongoing on Monday Wednesday Friday at the end of the day to help with tolerance of therapy. Anemia of chronic disease was treated with 4 doses of 0.5 g IV iron and he was maintained on [name] weekly.  Protein supplements were added to help with hypoalbuminemia/protein calorie malnutrition. His energy levels and endurance have gradually improved and he has progressed to supervision at wheelchair level. He will continue to receive follow up [place], [place], [place] aide and HHRN by [facility] after discharge.    Rehab course: During patient's stay in rehab weekly team conferences were held to monitor patient's progress, set goals and discuss barriers to discharge. At admission, patient required mod assist with basic self care task and with mobility. He  has had improvement in activity tolerance, balance, postural control as well as ability to compensate for deficits. He is able to complete ADL tasks with min assist for LB care and CGA for toilet transfers.  He is able to perform squat pivot transfers with supervision and ambulate 15 ' with min assist. Family education was completed with wife.    Disposition: Home  Diet: Renal diet w/1200 cc FR.   Special Instructions: Monitor BS bid Cath once a day if no void in 24 hours.   Allergies as of 06/08/2021       Reactions   Ambien [zolpidem] Other (See Comments)   It causes pt to have insomnia, NOT sleep- idiosyncratic reaction        Medication List     STOP taking these medications    clonazePAM 0.5 MG tablet Commonly known as: KLONOPIN Replaced by: clonazePAM 0.25 MG disintegrating  tablet   escitalopram 10 MG tablet Commonly known as: LEXAPRO   ferric citrate 1 GM 210 MG(Fe) tablet Commonly known as: Auryxia   HYDROmorphone 2 MG tablet Commonly known as: DILAUDID   nitroGLYCERIN 0.2 mg/hr patch Commonly known as: NITRODUR - Dosed in mg/24 hr   sorbitol 70 % Soln       TAKE these medications    acetaminophen 325 MG tablet Commonly known as: TYLENOL Take 1-2 tablets (325-650 mg total) by mouth every 4 (four) hours as needed for mild pain.   Aspirin Low Dose 81 MG EC tablet Generic drug: aspirin Take 1 tablet (81 mg total) by mouth daily.   atorvastatin 80 MG tablet Commonly known as: LIPITOR Take 1 tablet (80 mg total) by mouth daily. What changed: medication strength   Basaglar KwikPen 100 UNIT/ML Inject 5 Units into the skin at bedtime.   Bayer Contour Monitor w/Device Kit 1 each by Does not apply route 4 (four) times daily. Test 4 x daily   bethanechol 25 MG tablet Commonly known as: URECHOLINE Take 1 tablet (  25 mg total) by mouth 3 (three) times daily.   blood glucose meter kit and supplies Kit Dispense based on patient and insurance preference. Use up to four times daily as directed. (FOR ICD-9 250.00, 250.01).   calcitRIOL 0.25 MCG capsule Commonly known as: ROCALTROL Take 1 capsule (0.25 mcg total) by mouth 2 (two) times daily. What changed: when to take this   cinacalcet 30 MG tablet Commonly known as: SENSIPAR Take 1 tablet (30 mg total) by mouth once a week. Take on Mondays   clonazePAM 0.25 MG disintegrating tablet--Rx # 30 pills Commonly known as: KLONOPIN Take 1 tablet (0.25 mg total) by mouth 2 (two) times daily as needed (Anxiety). Replaces: clonazePAM 0.5 MG tablet   clopidogrel 75 MG tablet Commonly known as: PLAVIX Take 1 tablet (75 mg total) by mouth daily.   furosemide 80 MG tablet Commonly known as: LASIX Take 1 tablet (80 mg total) by mouth 2 (two) times daily.   gabapentin 300 MG capsule Commonly known  as: NEURONTIN Take 1 capsule (300 mg total) by mouth at bedtime.   gentamicin cream 0.1 % Commonly known as: GARAMYCIN Apply topically daily.   glucose blood test strip Commonly known as: Estate manager/land agent Use as instructed 4 x daily   Insulin Pen Needle 31G X 8 MM Misc Commonly known as: B-D ULTRAFINE III SHORT PEN 1 each by Does not apply route as directed.   Lancets Misc 1 each by Does not apply route 4 (four) times daily.   linaclotide 290 MCG Caps capsule Commonly known as: LINZESS Take 1 capsule (290 mcg total) by mouth daily before breakfast.   metaxalone 400 MG tablet Commonly known as: SKELAXIN Take 1 tablet (400 mg total) by mouth 3 (three) times daily as needed for muscle spasms (please offer- pt forgets to ask).   midodrine 10 MG tablet Commonly known as: PROAMATINE Take 0.5 tablets (5 mg total) by mouth every Monday, Wednesday, and Friday with hemodialysis. What changed: medication strength   multivitamin Tabs tablet Take 1 tablet by mouth at bedtime.   nutrition supplement (JUVEN) Pack Take 1 packet by mouth 2 (two) times daily between meals.   oxyCODONE-acetaminophen 5-325 MG tablet--Rx# 21 pills Commonly known as: PERCOCET/ROXICET Take 1 tablet by mouth every 8 (eight) hours as needed for severe pain.   pantoprazole 40 MG tablet Commonly known as: PROTONIX Take 1 tablet (40 mg total) by mouth daily.   pentoxifylline 400 MG CR tablet Commonly known as: TRENTAL Take 1 tablet (400 mg total) by mouth 3 (three) times daily with meals.   polyethylene glycol 17 g packet Commonly known as: MIRALAX / GLYCOLAX Take 17 g by mouth daily as needed for mild constipation.   QUEtiapine 50 MG tablet Commonly known as: SEROQUEL Take 1 tablet (50 mg total) by mouth at bedtime.   senna-docusate 8.6-50 MG tablet Commonly known as: Senokot-S Take 2 tablets by mouth 2 (two) times daily.   tamsulosin 0.4 MG Caps capsule Commonly known as: FLOMAX Take 2 capsules  (0.8 mg total) by mouth at bedtime. What changed: how much to take   traMADol 50 MG tablet--Rx# 20 pills Commonly known as: ULTRAM Take 1 tablet (50 mg total) by mouth every 6 (six) hours as needed for moderate pain.   zinc sulfate 220 (50 Zn) MG capsule Take 1 capsule (220 mg total) by mouth daily.        Follow-up Information     Lovorn, Jinny Blossom, MD Follow up.   Specialty:  Physical Medicine and Rehabilitation Why: office will call you with follow up appointment Contact information: [address] [place] [address] [phone]         [name], DO. Call.   Specialty: Family Medicine Why: for post hospital follow up Contact information: [address] [place] [address] [phone]         Newt Minion, MD Follow up.   Specialty: Orthopedic Surgery Contact information: [address] [place] [address] [phone]                 Signed: Bary Leriche 06/15/2021, 12:26 AM

## 2021-06-15 ENCOUNTER — Emergency Department (HOSPITAL_COMMUNITY): Payer: Medicare Other

## 2021-06-15 ENCOUNTER — Other Ambulatory Visit: Payer: Self-pay

## 2021-06-15 ENCOUNTER — Encounter (HOSPITAL_COMMUNITY): Payer: Self-pay

## 2021-06-15 ENCOUNTER — Inpatient Hospital Stay (HOSPITAL_COMMUNITY)
Admission: EM | Admit: 2021-06-15 | Discharge: 2021-06-18 | DRG: 689 | Disposition: A | Discharge status: Home Health | Payer: Medicare Other | Attending: Internal Medicine | Admitting: Internal Medicine

## 2021-06-15 DIAGNOSIS — F419 Anxiety disorder, unspecified: Secondary | ICD-10-CM | POA: Diagnosis present

## 2021-06-15 DIAGNOSIS — N390 Urinary tract infection, site not specified: Principal | ICD-10-CM | POA: Diagnosis present

## 2021-06-15 DIAGNOSIS — D631 Anemia in chronic kidney disease: Secondary | ICD-10-CM | POA: Diagnosis present

## 2021-06-15 DIAGNOSIS — Z7902 Long term (current) use of antithrombotics/antiplatelets: Secondary | ICD-10-CM

## 2021-06-15 DIAGNOSIS — Z8673 Personal history of transient ischemic attack (TIA), and cerebral infarction without residual deficits: Secondary | ICD-10-CM

## 2021-06-15 DIAGNOSIS — E782 Mixed hyperlipidemia: Secondary | ICD-10-CM

## 2021-06-15 DIAGNOSIS — I2510 Atherosclerotic heart disease of native coronary artery without angina pectoris: Secondary | ICD-10-CM | POA: Diagnosis present

## 2021-06-15 DIAGNOSIS — T402X5A Adverse effect of other opioids, initial encounter: Secondary | ICD-10-CM

## 2021-06-15 DIAGNOSIS — N186 End stage renal disease: Secondary | ICD-10-CM | POA: Diagnosis not present

## 2021-06-15 DIAGNOSIS — R339 Retention of urine, unspecified: Secondary | ICD-10-CM

## 2021-06-15 DIAGNOSIS — R531 Weakness: Secondary | ICD-10-CM | POA: Diagnosis not present

## 2021-06-15 DIAGNOSIS — E1122 Type 2 diabetes mellitus with diabetic chronic kidney disease: Secondary | ICD-10-CM

## 2021-06-15 DIAGNOSIS — E1151 Type 2 diabetes mellitus with diabetic peripheral angiopathy without gangrene: Secondary | ICD-10-CM | POA: Diagnosis present

## 2021-06-15 DIAGNOSIS — Z7982 Long term (current) use of aspirin: Secondary | ICD-10-CM

## 2021-06-15 DIAGNOSIS — I252 Old myocardial infarction: Secondary | ICD-10-CM

## 2021-06-15 DIAGNOSIS — K5903 Drug induced constipation: Secondary | ICD-10-CM

## 2021-06-15 DIAGNOSIS — G4700 Insomnia, unspecified: Secondary | ICD-10-CM

## 2021-06-15 DIAGNOSIS — L8990 Pressure ulcer of unspecified site, unspecified stage: Secondary | ICD-10-CM | POA: Insufficient documentation

## 2021-06-15 DIAGNOSIS — E785 Hyperlipidemia, unspecified: Secondary | ICD-10-CM | POA: Diagnosis present

## 2021-06-15 DIAGNOSIS — B961 Klebsiella pneumoniae [K. pneumoniae] as the cause of diseases classified elsewhere: Secondary | ICD-10-CM | POA: Diagnosis present

## 2021-06-15 DIAGNOSIS — Z951 Presence of aortocoronary bypass graft: Secondary | ICD-10-CM

## 2021-06-15 DIAGNOSIS — K219 Gastro-esophageal reflux disease without esophagitis: Secondary | ICD-10-CM | POA: Diagnosis present

## 2021-06-15 DIAGNOSIS — Z992 Dependence on renal dialysis: Secondary | ICD-10-CM

## 2021-06-15 DIAGNOSIS — L89151 Pressure ulcer of sacral region, stage 1: Secondary | ICD-10-CM | POA: Diagnosis present

## 2021-06-15 DIAGNOSIS — Z79899 Other long term (current) drug therapy: Secondary | ICD-10-CM

## 2021-06-15 DIAGNOSIS — I10 Essential (primary) hypertension: Secondary | ICD-10-CM | POA: Diagnosis not present

## 2021-06-15 DIAGNOSIS — I120 Hypertensive chronic kidney disease with stage 5 chronic kidney disease or end stage renal disease: Secondary | ICD-10-CM

## 2021-06-15 DIAGNOSIS — Z794 Long term (current) use of insulin: Secondary | ICD-10-CM

## 2021-06-15 DIAGNOSIS — R680 Hypothermia, not associated with low environmental temperature: Secondary | ICD-10-CM | POA: Diagnosis present

## 2021-06-15 DIAGNOSIS — Z905 Acquired absence of kidney: Secondary | ICD-10-CM

## 2021-06-15 DIAGNOSIS — Z89512 Acquired absence of left leg below knee: Secondary | ICD-10-CM

## 2021-06-15 DIAGNOSIS — E1169 Type 2 diabetes mellitus with other specified complication: Secondary | ICD-10-CM | POA: Diagnosis present

## 2021-06-15 DIAGNOSIS — Z823 Family history of stroke: Secondary | ICD-10-CM

## 2021-06-15 DIAGNOSIS — M898X9 Other specified disorders of bone, unspecified site: Secondary | ICD-10-CM | POA: Diagnosis present

## 2021-06-15 DIAGNOSIS — Z20822 Contact with and (suspected) exposure to covid-19: Secondary | ICD-10-CM | POA: Diagnosis present

## 2021-06-15 LAB — CBC WITH DIFFERENTIAL/PLATELET
Abs Immature Granulocytes: 0.04 10*3/uL (ref 0.00–0.07)
Basophils Absolute: 0 10*3/uL (ref 0.0–0.1)
Basophils Relative: 1 %
Eosinophils Absolute: 0 10*3/uL (ref 0.0–0.5)
Eosinophils Relative: 1 %
HCT: 35.2 % — ABNORMAL LOW (ref 39.0–52.0)
Hemoglobin: 10.4 g/dL — ABNORMAL LOW (ref 13.0–17.0)
Immature Granulocytes: 1 %
Lymphocytes Relative: 21 %
Lymphs Abs: 1.5 10*3/uL (ref 0.7–4.0)
MCH: 32.4 pg (ref 26.0–34.0)
MCHC: 29.5 g/dL — ABNORMAL LOW (ref 30.0–36.0)
MCV: 109.7 fL — ABNORMAL HIGH (ref 80.0–100.0)
Monocytes Absolute: 0.8 10*3/uL (ref 0.1–1.0)
Monocytes Relative: 10 %
Neutro Abs: 5 10*3/uL (ref 1.7–7.7)
Neutrophils Relative %: 66 %
Platelets: 174 10*3/uL (ref 150–400)
RBC: 3.21 MIL/uL — ABNORMAL LOW (ref 4.22–5.81)
RDW: 18.2 % — ABNORMAL HIGH (ref 11.5–15.5)
WBC: 7.3 10*3/uL (ref 4.0–10.5)
nRBC: 0 % (ref 0.0–0.2)

## 2021-06-15 LAB — URINALYSIS, ROUTINE W REFLEX MICROSCOPIC
Bilirubin Urine: NEGATIVE
Glucose, UA: NEGATIVE mg/dL
Ketones, ur: NEGATIVE mg/dL
Nitrite: NEGATIVE
Protein, ur: 100 mg/dL — AB
Specific Gravity, Urine: 1.013 (ref 1.005–1.030)
WBC, UA: >50 WBC/hpf — ABNORMAL HIGH (ref 0–5)
pH: 8 (ref 5.0–8.0)

## 2021-06-15 LAB — CBC
HCT: 35.5 % — ABNORMAL LOW (ref 39.0–52.0)
Hemoglobin: 10.5 g/dL — ABNORMAL LOW (ref 13.0–17.0)
MCH: 32.9 pg (ref 26.0–34.0)
MCHC: 29.6 g/dL — ABNORMAL LOW (ref 30.0–36.0)
MCV: 111.3 fL — ABNORMAL HIGH (ref 80.0–100.0)
Platelets: 172 10*3/uL (ref 150–400)
RBC: 3.19 MIL/uL — ABNORMAL LOW (ref 4.22–5.81)
RDW: 18.1 % — ABNORMAL HIGH (ref 11.5–15.5)
WBC: 7.5 10*3/uL (ref 4.0–10.5)
nRBC: 0 % (ref 0.0–0.2)

## 2021-06-15 LAB — COMPREHENSIVE METABOLIC PANEL
ALT: 19 U/L (ref 0–44)
AST: 32 U/L (ref 15–41)
Albumin: 3 g/dL — ABNORMAL LOW (ref 3.5–5.0)
Alkaline Phosphatase: 116 U/L (ref 38–126)
Anion gap: 13 (ref 5–15)
BUN: 26 mg/dL — ABNORMAL HIGH (ref 8–23)
CO2: 31 mmol/L (ref 22–32)
Calcium: 9.4 mg/dL (ref 8.9–10.3)
Chloride: 96 mmol/L — ABNORMAL LOW (ref 98–111)
Creatinine, Ser: 4.75 mg/dL — ABNORMAL HIGH (ref 0.61–1.24)
GFR, Estimated: 13 mL/min — ABNORMAL LOW (ref >60)
Glucose, Bld: 133 mg/dL — ABNORMAL HIGH (ref 70–99)
Potassium: 4.5 mmol/L (ref 3.5–5.1)
Sodium: 140 mmol/L (ref 135–145)
Total Bilirubin: 0.8 mg/dL (ref 0.3–1.2)
Total Protein: 6.8 g/dL (ref 6.5–8.1)

## 2021-06-15 LAB — LACTIC ACID, PLASMA: Lactic Acid, Venous: 1.7 mmol/L (ref 0.5–1.9)

## 2021-06-15 LAB — CBG MONITORING, ED: Glucose-Capillary: 117 mg/dL — ABNORMAL HIGH (ref 70–99)

## 2021-06-15 LAB — LIPASE, BLOOD: Lipase: 36 U/L (ref 11–51)

## 2021-06-15 MED ORDER — SODIUM CHLORIDE 0.9 % IV SOLN
1.0000 g | Freq: Once | INTRAVENOUS | Status: AC
  Administered 2021-06-15: 1 g via INTRAVENOUS
  Filled 2021-06-15: qty 10

## 2021-06-15 NOTE — ED Triage Notes (Signed)
Pt recently discharged from hospital for BKA due to diabetic foot ulcer. Since being home he reports issues with urination including retention, "milky discharge", and a foul odor. States he has had to self-cath due to retention issues. Pt is MWF hemodialysis patient.

## 2021-06-15 NOTE — ED Notes (Signed)
Informed provider that he would like to speak to her prior to the insertion of the foley.  Visitor also asked for "something to assist him w/ his anxiety."  Provider also made aware.

## 2021-06-15 NOTE — ED Provider Notes (Addendum)
[facility] EMERGENCY DEPARTMENT Provider Note   CSN: [id number] Arrival date & time: 06/15/21  1340     History Chief Complaint  Patient presents with   UTI    Maxwell Harmon is a 65 y.o. male.  HPI 65 year old male history of diabetes, status post left BKA 7 7, diabetes, coronary artery disease, end-stage renal disease, on dialysis, continues to make urine presents today complaining of urinary pain, difficulty urinating, and sweats.  He reports that the symptoms have been going on for several days.  He feels that at home he has had had difficulty urinating and thought that he had urinary retention.  He has done in and out caths several times due to this.  He reports that his stump has been healing well and he was seen in the orthopedic office and had the sutures removed.  He and his wife feel that this wound is at its baseline.  He has not had any vomiting but has had some nausea.  He has not had fevers but has had chills.  He reports some urethral milky discharge.  He feels generally weak but denies chest pain or shortness of breath.  Patient is dialyzed Monday Wednesday Friday.  He reports that he has a prior peritoneal dialysis catheter in place.  He is currently dialyzed from a left upper arm graft.  He reports that he does not plan on doing peritoneal dialysis again in the future and prefer that the catheter be removed.     Past Medical History:  Diagnosis Date   Anemia of chronic disease    CAD (coronary artery disease)    Diabetes mellitus without complication ([place])    ESRD (end stage renal disease) on dialysis ([place])    MWF in [place]   History of bleeding ulcers    History of TIAs    Hypertension    NSTEMI (non-ST elevated myocardial infarction) ([place]) 04/2021   PAD (peripheral artery disease) ([place])     Patient Active Problem List   Diagnosis Date Noted   Insomnia 06/15/2021   Therapeutic opioid-induced constipation ([facility]) 06/15/2021   Below-knee  amputation of left lower extremity ([place]) 05/18/2021   Acute on chronic systolic (congestive) heart failure ([place])    Ischemic ulcer of left heel ([place])    Cellulitis 05/07/2021   Ischemic ulcer diabetic foot ([place]) 05/07/2021   Critical lower limb ischemia ([place]) 04/20/2021   Sepsis ([place]) 04/20/2021   NSTEMI (non-ST elevated myocardial infarction) ([place]) 04/20/2021   Pressure ulcer, stage 2 ([place]) 04/11/2021   Acute heart failure ([place]) 04/10/2021   Uremia 04/10/2021   Gangrene of left foot ([facility])    PAD (peripheral artery disease) ([place])    ESRD on hemodialysis ([place])    Dyspnea on exertion    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[place]) 09/02/2015   Hyperlipidemia 09/02/2015   Essential hypertension, benign 09/02/2015   Obesity due to excess calories 09/02/2015    Past Surgical History:  Procedure Laterality Date   ABDOMINAL AORTOGRAM W/LOWER EXTREMITY Bilateral 04/14/2021   Procedure: ABDOMINAL AORTOGRAM W/LOWER EXTREMITY;  Surgeon: Elam Dutch, MD;  Location: [facility];  Service: Vascular;  Laterality: Bilateral;   AMPUTATION Left 05/12/2021   Procedure: LEFT BELOW KNEE AMPUTATION;  Surgeon: Newt Minion, MD;  Location: [facility];  Service: Orthopedics;  Laterality: Left;   CATARACT EXTRACTION W/PHACO Left 02/15/2014   Procedure: CATARACT EXTRACTION PHACO AND  INTRAOCULAR LENS PLACEMENT (IOC);  Surgeon: Tonny Branch, MD;  Location: [facility];  Service: Ophthalmology;  Laterality: Left;  CDE 10.84   CATARACT EXTRACTION W/PHACO Right 12/14/2013   Procedure: CATARACT EXTRACTION PHACO AND INTRAOCULAR LENS PLACEMENT (IOC);  Surgeon: Tonny Branch, MD;  Location: [facility];  Service: Ophthalmology;  Laterality: Right;  CDE:  16.30   CHOLECYSTECTOMY  02/2013   EYE SURGERY     IR FLUORO GUIDE CV LINE RIGHT  01/05/2019   IR US GUIDE VASC ACCESS RIGHT  01/05/2019   LAPAROSCOPIC NEPHRECTOMY Right 01/08/2019   Procedure: LAPAROSCOPIC  RADICAL NEPHRECTOMY;  Surgeon: Raynelle Bring, MD;  Location: [facility];  Service: Urology;  Laterality: Right;   LEFT HEART CATH AND CORS/GRAFTS ANGIOGRAPHY N/A 04/14/2021   Procedure: LEFT HEART CATH AND CORS/GRAFTS ANGIOGRAPHY;  Surgeon: Jolaine Artist, MD;  Location: [facility];  Service: Cardiovascular;  Laterality: N/A;   PENILE PROSTHESIS IMPLANT     PERIPHERAL VASCULAR INTERVENTION Left 04/19/2021   Procedure: PERIPHERAL VASCULAR INTERVENTION;  Surgeon: Cherre [place], MD;  Location: [facility];  Service: Cardiovascular;  Laterality: Left;   TOE AMPUTATION Right 2013   [facility]       Family History  Problem Relation Age of Onset   Stroke Mother    Stroke Father    Cancer Father    Cancer Brother     Social History   Tobacco Use   Smoking status: Never   Smokeless tobacco: Never  Vaping Use   Vaping Use: Never used  Substance Use Topics   Alcohol use: Yes    Comment: social drink    Drug use: No    Home Medications Prior to Admission medications   Medication Sig Start Date End Date Taking? Authorizing Provider  acetaminophen (TYLENOL) 325 MG tablet Take 1-2 tablets (325-650 mg total) by mouth every 4 (four) hours as needed for mild pain. 05/30/21   Love, Ivan Anchors, PA-C  aspirin 81 MG EC tablet Take 1 tablet (81 mg total) by mouth daily. 04/20/21 07/19/21  Dessa Phi, DO  atorvastatin (LIPITOR) 80 MG tablet Take 1 tablet (80 mg total) by mouth daily. 06/08/21   Love, Ivan Anchors, PA-C  bethanechol (URECHOLINE) 25 MG tablet Take 1 tablet (25 mg total) by mouth 3 (three) times daily. 06/08/21   Love, Ivan Anchors, PA-C  blood glucose meter kit and supplies KIT Dispense based on patient and insurance preference. Use up to four times daily as directed. (FOR ICD-9 250.00, 250.01). 11/08/15   Cassandria Anger, MD  Blood Glucose Monitoring Suppl (BAYER CONTOUR MONITOR) w/Device KIT 1 each by Does not apply route 4 (four) times daily. Test 4 x daily  11/08/15   Cassandria Anger, MD  calcitRIOL (ROCALTROL) 0.25 MCG capsule Take 1 capsule (0.25 mcg total) by mouth 2 (two) times daily. 06/08/21   Love, Ivan Anchors, PA-C  cinacalcet (SENSIPAR) 30 MG tablet Take 1 tablet (30 mg total) by mouth once a week. Take on Mondays 06/08/21   Love, Ivan Anchors, PA-C  clonazePAM (KLONOPIN) 0.25 MG disintegrating tablet Take 1 tablet (0.25 mg total) by mouth 2 (two) times daily as needed (Anxiety). 06/08/21   Love, Ivan Anchors, PA-C  clopidogrel (PLAVIX) 75 MG tablet Take 1 tablet (75 mg total) by mouth daily. 06/08/21   Love, Ivan Anchors, PA-C  furosemide (LASIX) 80 MG tablet Take 1 tablet (80 mg total) by mouth 2 (two) times daily. 06/08/21   Love, Ivan Anchors,  PA-C  gabapentin (NEURONTIN) 300 MG capsule Take 1 capsule (300 mg total) by mouth at bedtime. 06/08/21   Love, Ivan Anchors, PA-C  gentamicin cream (GARAMYCIN) 0.1 % Apply topically daily. 05/18/21   Little Ishikawa, MD  glucose blood (BAYER CONTOUR TEST) test strip Use as instructed 4 x daily 11/08/15   Cassandria Anger, MD  Insulin Glargine (BASAGLAR KWIKPEN) 100 UNIT/ML Inject 5 Units into the skin at bedtime. 04/20/21 07/19/21  Dessa Phi, DO  Insulin Pen Needle (B-D ULTRAFINE III SHORT PEN) 31G X 8 MM MISC 1 each by Does not apply route as directed. 09/02/15   Cassandria Anger, MD  Lancets MISC 1 each by Does not apply route 4 (four) times daily. 11/08/15   Cassandria Anger, MD  linaclotide Rolan Lipa) 290 MCG CAPS capsule Take 1 capsule (290 mcg total) by mouth daily before breakfast. 06/08/21   Love, Ivan Anchors, PA-C  metaxalone (SKELAXIN) 400 MG tablet Take 1 tablet (400 mg total) by mouth 3 (three) times daily as needed for muscle spasms (please offer- pt forgets to ask). 06/08/21   Love, Ivan Anchors, PA-C  midodrine (PROAMATINE) 10 MG tablet Take 0.5 tablets (5 mg total) by mouth every Monday, Wednesday, and Friday with hemodialysis. 06/08/21   Love, Ivan Anchors, PA-C  multivitamin (RENA-VIT) TABS tablet Take 1 tablet by  mouth at bedtime. 06/08/21   Love, Ivan Anchors, PA-C  nutrition supplement, JUVEN, (JUVEN) PACK Take 1 packet by mouth 2 (two) times daily between meals. 05/18/21   Little Ishikawa, MD  oxyCODONE-acetaminophen (PERCOCET/ROXICET) 5-325 MG tablet Take 1 tablet by mouth every 8 (eight) hours as needed for severe pain. 06/08/21   Love, Ivan Anchors, PA-C  pantoprazole (PROTONIX) 40 MG tablet Take 1 tablet (40 mg total) by mouth daily. 05/18/21   Little Ishikawa, MD  pentoxifylline (TRENTAL) 400 MG CR tablet Take 1 tablet (400 mg total) by mouth 3 (three) times daily with meals. 05/02/21   Newt Minion, MD  polyethylene glycol (MIRALAX / GLYCOLAX) 17 g packet Take 17 g by mouth daily as needed for mild constipation. 06/08/21   Love, Ivan Anchors, PA-C  QUEtiapine (SEROQUEL) 50 MG tablet Take 1 tablet (50 mg total) by mouth at bedtime. 06/08/21   Love, Ivan Anchors, PA-C  senna-docusate (SENOKOT-S) 8.6-50 MG tablet Take 2 tablets by mouth 2 (two) times daily. 06/08/21   Love, Ivan Anchors, PA-C  tamsulosin (FLOMAX) 0.4 MG CAPS capsule Take 2 capsules (0.8 mg total) by mouth at bedtime. 06/08/21   Love, Ivan Anchors, PA-C  traMADol (ULTRAM) 50 MG tablet Take 1 tablet (50 mg total) by mouth every 6 (six) hours as needed for moderate pain. 06/08/21   Love, Ivan Anchors, PA-C  zinc sulfate 220 (50 Zn) MG capsule Take 1 capsule (220 mg total) by mouth daily. 05/18/21   Little Ishikawa, MD    Allergies    Ambien [zolpidem]  Review of Systems   Review of Systems  Constitutional:  Positive for activity change, appetite change, chills, diaphoresis and fatigue. Negative for fever.  HENT: Negative.    Eyes: Negative.   Respiratory: Negative.    Cardiovascular: Negative.   Gastrointestinal:        Patient reports that he has annual dialysis catheter in place and he does not plan on using again and he has had SBP 1 time in the past  Endocrine: Negative.   Genitourinary:  Positive for difficulty urinating and dysuria.  Musculoskeletal:  Negative.  Allergic/Immunologic: Negative.   Neurological:  Positive for weakness.  Hematological: Negative.   Psychiatric/Behavioral: Negative.     Physical Exam Updated Vital Signs BP 138/83   Pulse (!) 101   Temp (!) 96.9 F (36.1 C) (Rectal)   Resp (!) 21   Ht 1.803 m (_0 )   Wt 85 kg   SpO2 100%   BMI 26.14 kg/m   Physical Exam  ED Results / Procedures / Treatments   Labs (all labs ordered are listed, but only abnormal results are displayed) Labs Reviewed  CBC WITH DIFFERENTIAL/PLATELET - Abnormal; Notable for the following components:      Result Value   RBC 3.21 (*)    Hemoglobin 10.4 (*)    HCT 35.2 (*)    MCV 109.7 (*)    MCHC 29.5 (*)    RDW 18.2 (*)    All other components within normal limits  COMPREHENSIVE METABOLIC PANEL - Abnormal; Notable for the following components:   Chloride 96 (*)    Glucose, Bld 133 (*)    BUN 26 (*)    Creatinine, Ser 4.75 (*)    Albumin 3.0 (*)    GFR, Estimated 13 (*)    All other components within normal limits  URINALYSIS, ROUTINE W REFLEX MICROSCOPIC - Abnormal; Notable for the following components:   Color, Urine AMBER (*)    APPearance TURBID (*)    Hgb urine dipstick SMALL (*)    Protein, ur 100 (*)    Leukocytes,Ua LARGE (*)    WBC, UA >50 (*)    Bacteria, UA MANY (*)    All other components within normal limits  CBC - Abnormal; Notable for the following components:   RBC 3.19 (*)    Hemoglobin 10.5 (*)    HCT 35.5 (*)    MCV 111.3 (*)    MCHC 29.6 (*)    RDW 18.1 (*)    All other components within normal limits  CBG MONITORING, ED - Abnormal; Notable for the following components:   Glucose-Capillary 117 (*)    All other components within normal limits  URINE CULTURE  CULTURE, BLOOD (ROUTINE X 2)  CULTURE, BLOOD (ROUTINE X 2)  LIPASE, BLOOD  LACTIC ACID, PLASMA  LACTIC ACID, PLASMA    EKG None  Radiology DG Chest Port 1 View  Result Date: 06/15/2021 CLINICAL DATA:  Weakness EXAM: PORTABLE  CHEST 1 VIEW COMPARISON:  05/20/2021 FINDINGS: Previous median sternotomy. Chronic cardiomegaly. Enlarging bilateral effusions with lower lobe atelectasis and or pneumonia. Upper lungs remain largely clear. IMPRESSION: Enlarging effusions and worsening lower lobe atelectasis and or pneumonia. Electronically Signed   By: Nelson Chimes M.D.   On: 06/15/2021 20:16    Procedures Procedures   Medications Ordered in ED Medications  cefTRIAXone (ROCEPHIN) 1 g in sodium chloride 0.9 % 100 mL IVPB (has no administration in time range)    ED Course  I have reviewed the triage vital signs and the nursing notes.  Pertinent labs & imaging results that were available during my care of the patient were reviewed by me and considered in my medical decision making (see chart for details).    MDM Rules/Calculators/A&P                          65 year old man with diabetes presents today with chills for several days.  He has having urinary tract infection symptoms with pain and frequency of urination.  He is hypothermic with  temperature 96.9, blood pressure stable at 148/83 and heart rate of 98.  Chest x-Worthy Boschert is concerning for infiltrates but he is not hypoxic and sats have been normal with normal respiratory effort. Urine is sent for culture Rocephin is given for possible UTI Zithromax was added to cover possible etiologies Patient has chronic renal failure appears stable with creatinine of 4.75 and normal potassium.  The anemic hemoglobin of 10, however this is improved from prior hemoglobins. Plan admission for ongoing evaluation and treatment Discussed care with Dr. Cyd Silence who accepts patient to his service.  Final Clinical Impression(s) / ED Diagnoses Final diagnoses:  Urinary tract infection without hematuria, site unspecified    Rx / DC Orders ED Discharge Orders     None        Pattricia Boss, MD 06/15/21 2153    Pattricia Boss, MD 06/15/21 2308

## 2021-06-15 NOTE — ED Provider Notes (Signed)
Emergency Medicine Provider Triage Evaluation Note  Maxwell Harmon , a 65 y.o. male  was evaluated in triage.  Pt complains of urinary retention, "milky," penile discharge urine with foul odor.  Patient reports that he has been having to self cath at home due to urinary retention.  Patient endorses suprapubic pressure with urination.  Endorses 1 instance of hematuria.  Denies any abdominal pain, nausea, vomiting, fevers, chills, flank pain.  Patient is on chemo dialysis Monday, Wednesday, and Friday.  Patient received dialysis yesterday.  Patient states that previously he was doing peritoneal dialysis.    Review of Systems  Positive: Urinary retention, hematuria, penile discharge Negative: Abdominal pain, nausea, vomiting fevers, chills  Physical Exam  BP 136/81   Pulse 96   Temp 97.9 F (36.6 C)   Resp 16   Ht 5\' 11"  (1.803 m)   Wt 85 kg   SpO2 97%   BMI 26.14 kg/m  Gen:   Awake, no distress   Resp:  Normal effort  MSK:   Moves extremities without difficulty  Other:  Abdomen soft, nondistended, nontender.  Medical Decision Making  Medically screening exam initiated at 2:25 PM.  Appropriate orders placed.  Maxwell Harmon was informed that the remainder of the evaluation will be completed by another provider, this initial triage assessment does not replace that evaluation, and the importance of remaining in the ED until their evaluation is complete.  Will obtain bladder scan.  If no signs of urinary retention while patient wait in waiting room for next available provider.  The patient appears stable so that the remainder of the work up may be completed by another provider.      Maxwell Beckwith, PA-C 06/15/21 1428    Maxwell Hidden, MD 06/15/21 [phone]

## 2021-06-16 ENCOUNTER — Encounter (HOSPITAL_COMMUNITY): Payer: Self-pay | Admitting: Internal Medicine

## 2021-06-16 DIAGNOSIS — Z951 Presence of aortocoronary bypass graft: Secondary | ICD-10-CM | POA: Diagnosis not present

## 2021-06-16 DIAGNOSIS — I252 Old myocardial infarction: Secondary | ICD-10-CM | POA: Diagnosis not present

## 2021-06-16 DIAGNOSIS — F419 Anxiety disorder, unspecified: Secondary | ICD-10-CM | POA: Diagnosis present

## 2021-06-16 DIAGNOSIS — Z8673 Personal history of transient ischemic attack (TIA), and cerebral infarction without residual deficits: Secondary | ICD-10-CM | POA: Diagnosis not present

## 2021-06-16 DIAGNOSIS — Z992 Dependence on renal dialysis: Secondary | ICD-10-CM | POA: Diagnosis not present

## 2021-06-16 DIAGNOSIS — N390 Urinary tract infection, site not specified: Secondary | ICD-10-CM | POA: Diagnosis present

## 2021-06-16 DIAGNOSIS — N186 End stage renal disease: Secondary | ICD-10-CM | POA: Diagnosis present

## 2021-06-16 DIAGNOSIS — E1122 Type 2 diabetes mellitus with diabetic chronic kidney disease: Secondary | ICD-10-CM | POA: Diagnosis present

## 2021-06-16 DIAGNOSIS — Z905 Acquired absence of kidney: Secondary | ICD-10-CM | POA: Diagnosis not present

## 2021-06-16 DIAGNOSIS — L8990 Pressure ulcer of unspecified site, unspecified stage: Secondary | ICD-10-CM | POA: Insufficient documentation

## 2021-06-16 DIAGNOSIS — Z7902 Long term (current) use of antithrombotics/antiplatelets: Secondary | ICD-10-CM | POA: Diagnosis not present

## 2021-06-16 DIAGNOSIS — I120 Hypertensive chronic kidney disease with stage 5 chronic kidney disease or end stage renal disease: Secondary | ICD-10-CM | POA: Diagnosis present

## 2021-06-16 DIAGNOSIS — R531 Weakness: Secondary | ICD-10-CM | POA: Diagnosis present

## 2021-06-16 DIAGNOSIS — B961 Klebsiella pneumoniae [K. pneumoniae] as the cause of diseases classified elsewhere: Secondary | ICD-10-CM | POA: Diagnosis present

## 2021-06-16 DIAGNOSIS — L89151 Pressure ulcer of sacral region, stage 1: Secondary | ICD-10-CM | POA: Diagnosis present

## 2021-06-16 DIAGNOSIS — Z20822 Contact with and (suspected) exposure to covid-19: Secondary | ICD-10-CM | POA: Diagnosis present

## 2021-06-16 DIAGNOSIS — R680 Hypothermia, not associated with low environmental temperature: Secondary | ICD-10-CM | POA: Diagnosis present

## 2021-06-16 DIAGNOSIS — Z89512 Acquired absence of left leg below knee: Secondary | ICD-10-CM | POA: Diagnosis not present

## 2021-06-16 DIAGNOSIS — Z79899 Other long term (current) drug therapy: Secondary | ICD-10-CM | POA: Diagnosis not present

## 2021-06-16 DIAGNOSIS — K219 Gastro-esophageal reflux disease without esophagitis: Secondary | ICD-10-CM | POA: Diagnosis present

## 2021-06-16 DIAGNOSIS — E1169 Type 2 diabetes mellitus with other specified complication: Secondary | ICD-10-CM | POA: Diagnosis present

## 2021-06-16 DIAGNOSIS — R339 Retention of urine, unspecified: Secondary | ICD-10-CM

## 2021-06-16 DIAGNOSIS — I2510 Atherosclerotic heart disease of native coronary artery without angina pectoris: Secondary | ICD-10-CM | POA: Diagnosis present

## 2021-06-16 DIAGNOSIS — D631 Anemia in chronic kidney disease: Secondary | ICD-10-CM | POA: Diagnosis present

## 2021-06-16 DIAGNOSIS — E785 Hyperlipidemia, unspecified: Secondary | ICD-10-CM | POA: Diagnosis present

## 2021-06-16 DIAGNOSIS — Z823 Family history of stroke: Secondary | ICD-10-CM | POA: Diagnosis not present

## 2021-06-16 DIAGNOSIS — E1151 Type 2 diabetes mellitus with diabetic peripheral angiopathy without gangrene: Secondary | ICD-10-CM | POA: Diagnosis present

## 2021-06-16 DIAGNOSIS — M898X9 Other specified disorders of bone, unspecified site: Secondary | ICD-10-CM | POA: Diagnosis present

## 2021-06-16 LAB — CBC WITH DIFFERENTIAL/PLATELET
Abs Immature Granulocytes: 0.02 10*3/uL (ref 0.00–0.07)
Basophils Absolute: 0.1 10*3/uL (ref 0.0–0.1)
Basophils Relative: 1 %
Eosinophils Absolute: 0 10*3/uL (ref 0.0–0.5)
Eosinophils Relative: 0 %
HCT: 31.6 % — ABNORMAL LOW (ref 39.0–52.0)
Hemoglobin: 9.3 g/dL — ABNORMAL LOW (ref 13.0–17.0)
Immature Granulocytes: 0 %
Lymphocytes Relative: 24 %
Lymphs Abs: 1.6 10*3/uL (ref 0.7–4.0)
MCH: 32.3 pg (ref 26.0–34.0)
MCHC: 29.4 g/dL — ABNORMAL LOW (ref 30.0–36.0)
MCV: 109.7 fL — ABNORMAL HIGH (ref 80.0–100.0)
Monocytes Absolute: 0.7 10*3/uL (ref 0.1–1.0)
Monocytes Relative: 10 %
Neutro Abs: 4.4 10*3/uL (ref 1.7–7.7)
Neutrophils Relative %: 65 %
Platelets: 151 10*3/uL (ref 150–400)
RBC: 2.88 MIL/uL — ABNORMAL LOW (ref 4.22–5.81)
RDW: 18 % — ABNORMAL HIGH (ref 11.5–15.5)
WBC: 6.8 10*3/uL (ref 4.0–10.5)
nRBC: 0 % (ref 0.0–0.2)

## 2021-06-16 LAB — GLUCOSE, CAPILLARY
Glucose-Capillary: 159 mg/dL — ABNORMAL HIGH (ref 70–99)
Glucose-Capillary: 148 mg/dL — ABNORMAL HIGH (ref 70–99)

## 2021-06-16 LAB — COMPREHENSIVE METABOLIC PANEL
ALT: 18 U/L (ref 0–44)
AST: 21 U/L (ref 15–41)
Albumin: 2.5 g/dL — ABNORMAL LOW (ref 3.5–5.0)
Alkaline Phosphatase: 107 U/L (ref 38–126)
Anion gap: 11 (ref 5–15)
BUN: 33 mg/dL — ABNORMAL HIGH (ref 8–23)
CO2: 29 mmol/L (ref 22–32)
Calcium: 9.2 mg/dL (ref 8.9–10.3)
Chloride: 99 mmol/L (ref 98–111)
Creatinine, Ser: 5.37 mg/dL — ABNORMAL HIGH (ref 0.61–1.24)
GFR, Estimated: 11 mL/min — ABNORMAL LOW (ref >60)
Glucose, Bld: 138 mg/dL — ABNORMAL HIGH (ref 70–99)
Potassium: 4.4 mmol/L (ref 3.5–5.1)
Sodium: 139 mmol/L (ref 135–145)
Total Bilirubin: 0.8 mg/dL (ref 0.3–1.2)
Total Protein: 5.7 g/dL — ABNORMAL LOW (ref 6.5–8.1)

## 2021-06-16 LAB — RESP PANEL BY RT-PCR (FLU A&B, COVID) ARPGX2
Influenza A by PCR: NEGATIVE
Influenza B by PCR: NEGATIVE
SARS Coronavirus 2 by RT PCR: NEGATIVE

## 2021-06-16 LAB — BRAIN NATRIURETIC PEPTIDE: B Natriuretic Peptide: >4500 pg/mL — ABNORMAL HIGH (ref 0.0–100.0)

## 2021-06-16 LAB — URINALYSIS, ROUTINE W REFLEX MICROSCOPIC
Bacteria, UA: NONE SEEN
Bilirubin Urine: NEGATIVE
Glucose, UA: NEGATIVE mg/dL
Ketones, ur: NEGATIVE mg/dL
Nitrite: NEGATIVE
Protein, ur: 100 mg/dL — AB
Specific Gravity, Urine: 1.012 (ref 1.005–1.030)
WBC, UA: >50 WBC/hpf — ABNORMAL HIGH (ref 0–5)
pH: 7 (ref 5.0–8.0)

## 2021-06-16 LAB — CBG MONITORING, ED
Glucose-Capillary: 100 mg/dL — ABNORMAL HIGH (ref 70–99)
Glucose-Capillary: 180 mg/dL — ABNORMAL HIGH (ref 70–99)

## 2021-06-16 LAB — HEMOGLOBIN A1C
Hgb A1c MFr Bld: 5.3 % (ref 4.8–5.6)
Mean Plasma Glucose: 105.41 mg/dL

## 2021-06-16 LAB — MAGNESIUM: Magnesium: 2.1 mg/dL (ref 1.7–2.4)

## 2021-06-16 MED ORDER — ONDANSETRON HCL 4 MG/2ML IJ SOLN
4.0000 mg | Freq: Four times a day (QID) | INTRAMUSCULAR | Status: DC | PRN
  Administered 2021-06-17: 4 mg via INTRAVENOUS
  Filled 2021-06-16: qty 2

## 2021-06-16 MED ORDER — HEPARIN SODIUM (PORCINE) 5000 UNIT/ML IJ SOLN
5000.0000 [IU] | Freq: Three times a day (TID) | INTRAMUSCULAR | Status: DC
  Administered 2021-06-16 – 2021-06-18 (×5): 5000 [IU] via SUBCUTANEOUS
  Filled 2021-06-16 (×6): qty 1

## 2021-06-16 MED ORDER — CALCITRIOL 0.5 MCG PO CAPS: 0.5000 ug | ORAL_CAPSULE | ORAL | Status: DC

## 2021-06-16 MED ORDER — JUVEN PO PACK
1.0000 | PACK | Freq: Two times a day (BID) | ORAL | Status: DC
  Administered 2021-06-16 – 2021-06-18 (×5): 1 via ORAL
  Filled 2021-06-16 (×6): qty 1

## 2021-06-16 MED ORDER — ACETAMINOPHEN 325 MG PO TABS
650.0000 mg | ORAL_TABLET | Freq: Four times a day (QID) | ORAL | Status: DC | PRN
  Administered 2021-06-16 – 2021-06-18 (×3): 650 mg via ORAL
  Filled 2021-06-16 (×4): qty 2

## 2021-06-16 MED ORDER — ONDANSETRON HCL 4 MG PO TABS: 4.0000 mg | ORAL_TABLET | Freq: Four times a day (QID) | ORAL | Status: DC | PRN

## 2021-06-16 MED ORDER — CLONAZEPAM 0.25 MG PO TBDP
0.2500 mg | ORAL_TABLET | Freq: Two times a day (BID) | ORAL | Status: DC | PRN
  Administered 2021-06-16 (×2): 0.25 mg via ORAL
  Filled 2021-06-16 (×2): qty 1

## 2021-06-16 MED ORDER — CALCITRIOL 0.25 MCG PO CAPS
0.2500 ug | ORAL_CAPSULE | Freq: Two times a day (BID) | ORAL | Status: DC
  Administered 2021-06-16: 0.25 ug via ORAL
  Filled 2021-06-16 (×2): qty 1

## 2021-06-16 MED ORDER — TRAMADOL HCL 50 MG PO TABS
50.0000 mg | ORAL_TABLET | Freq: Four times a day (QID) | ORAL | Status: DC | PRN
  Administered 2021-06-17 – 2021-06-18 (×2): 50 mg via ORAL
  Filled 2021-06-16 (×2): qty 1

## 2021-06-16 MED ORDER — BETHANECHOL CHLORIDE 25 MG PO TABS
25.0000 mg | ORAL_TABLET | Freq: Three times a day (TID) | ORAL | Status: DC
  Filled 2021-06-16 (×3): qty 1

## 2021-06-16 MED ORDER — ACETAMINOPHEN 650 MG RE SUPP: 650.0000 mg | Freq: Four times a day (QID) | RECTAL | Status: DC | PRN

## 2021-06-16 MED ORDER — DARBEPOETIN ALFA 100 MCG/0.5ML IJ SOSY: 100.0000 ug | PREFILLED_SYRINGE | INTRAMUSCULAR | Status: DC

## 2021-06-16 MED ORDER — ASPIRIN EC 81 MG PO TBEC
81.0000 mg | DELAYED_RELEASE_TABLET | Freq: Every day | ORAL | Status: DC
  Administered 2021-06-16 – 2021-06-18 (×3): 81 mg via ORAL
  Filled 2021-06-16 (×3): qty 1

## 2021-06-16 MED ORDER — MIDODRINE HCL 5 MG PO TABS
5.0000 mg | ORAL_TABLET | ORAL | Status: DC
  Administered 2021-06-17: 5 mg via ORAL
  Filled 2021-06-16: qty 1

## 2021-06-16 MED ORDER — SODIUM CHLORIDE 0.9 % IV SOLN
1.0000 g | INTRAVENOUS | Status: DC
  Administered 2021-06-16 – 2021-06-17 (×2): 1 g via INTRAVENOUS
  Filled 2021-06-16 (×3): qty 10

## 2021-06-16 MED ORDER — INSULIN GLARGINE-YFGN 100 UNIT/ML ~~LOC~~ SOLN
5.0000 [IU] | Freq: Every day | SUBCUTANEOUS | Status: DC
  Administered 2021-06-16 – 2021-06-17 (×3): 5 [IU] via SUBCUTANEOUS
  Filled 2021-06-16 (×4): qty 0.05

## 2021-06-16 MED ORDER — CHLORHEXIDINE GLUCONATE CLOTH 2 % EX PADS
6.0000 | MEDICATED_PAD | Freq: Every day | CUTANEOUS | Status: DC
  Administered 2021-06-17 – 2021-06-18 (×2): 6 via TOPICAL

## 2021-06-16 MED ORDER — CLOPIDOGREL BISULFATE 75 MG PO TABS
75.0000 mg | ORAL_TABLET | Freq: Every day | ORAL | Status: DC
  Administered 2021-06-16 – 2021-06-18 (×3): 75 mg via ORAL
  Filled 2021-06-16 (×3): qty 1

## 2021-06-16 MED ORDER — INSULIN ASPART 100 UNIT/ML IJ SOLN
0.0000 [IU] | Freq: Three times a day (TID) | INTRAMUSCULAR | Status: DC
  Administered 2021-06-16 (×2): 2 [IU] via SUBCUTANEOUS
  Administered 2021-06-16: 1 [IU] via SUBCUTANEOUS
  Administered 2021-06-17: 2 [IU] via SUBCUTANEOUS
  Administered 2021-06-17 – 2021-06-18 (×2): 1 [IU] via SUBCUTANEOUS

## 2021-06-16 MED ORDER — PANTOPRAZOLE SODIUM 40 MG PO TBEC
40.0000 mg | DELAYED_RELEASE_TABLET | Freq: Every day | ORAL | Status: DC
  Administered 2021-06-16 – 2021-06-18 (×3): 40 mg via ORAL
  Filled 2021-06-16 (×3): qty 1

## 2021-06-16 MED ORDER — POLYETHYLENE GLYCOL 3350 17 G PO PACK: 17.0000 g | PACK | Freq: Every day | ORAL | Status: DC | PRN

## 2021-06-16 MED ORDER — RENA-VITE PO TABS
1.0000 | ORAL_TABLET | Freq: Every day | ORAL | Status: DC
  Administered 2021-06-16 – 2021-06-17 (×3): 1 via ORAL
  Filled 2021-06-16 (×4): qty 1

## 2021-06-16 MED ORDER — TAMSULOSIN HCL 0.4 MG PO CAPS
0.4000 mg | ORAL_CAPSULE | Freq: Two times a day (BID) | ORAL | Status: DC
  Administered 2021-06-16 – 2021-06-18 (×5): 0.4 mg via ORAL
  Filled 2021-06-16 (×5): qty 1

## 2021-06-16 MED ORDER — PENTOXIFYLLINE ER 400 MG PO TBCR
400.0000 mg | EXTENDED_RELEASE_TABLET | Freq: Three times a day (TID) | ORAL | Status: DC
  Administered 2021-06-16 – 2021-06-18 (×6): 400 mg via ORAL
  Filled 2021-06-16 (×9): qty 1

## 2021-06-16 MED ORDER — METAXALONE 800 MG PO TABS
400.0000 mg | ORAL_TABLET | Freq: Three times a day (TID) | ORAL | Status: DC | PRN
  Administered 2021-06-16 – 2021-06-17 (×2): 400 mg via ORAL
  Filled 2021-06-16 (×2): qty 0.5
  Filled 2021-06-16: qty 1
  Filled 2021-06-16: qty 0.5

## 2021-06-16 NOTE — Progress Notes (Signed)
Pt hemodialysis treatment move to tomorrow per Dr. Royce Macadamia ordered. [place]. [facility] Therapist, sports.notified.

## 2021-06-16 NOTE — Plan of Care (Signed)

## 2021-06-16 NOTE — Consult Note (Signed)
[facility] Renal Consultation Note  Requesting MD: Berle Mull, MD Indication for Consultation:  ESRD  Chief complaint: chills/malaise, purulent urine   HPI:  Maxwell Harmon is a 65 y.o. male with a history of ESRD on HD Monday Wednesday Friday in [place], diabetes mellitus, coronary artery disease, hypertension, and peripheral artery disease who presented to the hospital with cold sweats and purulent urine.  Does still make some urine.  No foley.  Some burning with urination.  No n/v and no flank pain.  He is to be admitted by the hospitalist service and they have consulted nephrology for assistance with management of dialysis.  He was previously on PD and had issues with inadequate drainage.  Covid negative.  Hasn't been able to set up PD catether removal yet.  Recently here with month long hospital stay and left BKA with rehab afterward.  PMHx:   Past Medical History:  Diagnosis Date   Anemia of chronic disease    CAD (coronary artery disease)    Diabetes mellitus without complication (HCC)    ESRD (end stage renal disease) on dialysis ([place])    MWF in [place]   History of bleeding ulcers    History of TIAs    Hypertension    NSTEMI (non-ST elevated myocardial infarction) ([place]) 04/2021   PAD (peripheral artery disease) ([place])     Past Surgical History:  Procedure Laterality Date   ABDOMINAL AORTOGRAM W/LOWER EXTREMITY Bilateral 04/14/2021   Procedure: ABDOMINAL AORTOGRAM W/LOWER EXTREMITY;  Surgeon: Elam Dutch, MD;  Location: [facility];  Service: Vascular;  Laterality: Bilateral;   AMPUTATION Left 05/12/2021   Procedure: LEFT BELOW KNEE AMPUTATION;  Surgeon: Newt Minion, MD;  Location: [facility];  Service: Orthopedics;  Laterality: Left;   CATARACT EXTRACTION W/PHACO Left 02/15/2014   Procedure: CATARACT EXTRACTION PHACO AND INTRAOCULAR LENS PLACEMENT (IOC);  Surgeon: Tonny Branch, MD;  Location: [facility];  Service: Ophthalmology;  Laterality:  Left;  CDE 10.84   CATARACT EXTRACTION W/PHACO Right 12/14/2013   Procedure: CATARACT EXTRACTION PHACO AND INTRAOCULAR LENS PLACEMENT (IOC);  Surgeon: Tonny Branch, MD;  Location: [facility];  Service: Ophthalmology;  Laterality: Right;  CDE:  16.30   CHOLECYSTECTOMY  02/2013   EYE SURGERY     IR FLUORO GUIDE CV LINE RIGHT  01/05/2019   IR US GUIDE VASC ACCESS RIGHT  01/05/2019   LAPAROSCOPIC NEPHRECTOMY Right 01/08/2019   Procedure: LAPAROSCOPIC RADICAL NEPHRECTOMY;  Surgeon: Raynelle Bring, MD;  Location: [facility];  Service: Urology;  Laterality: Right;   LEFT HEART CATH AND CORS/GRAFTS ANGIOGRAPHY N/A 04/14/2021   Procedure: LEFT HEART CATH AND CORS/GRAFTS ANGIOGRAPHY;  Surgeon: Jolaine Artist, MD;  Location: [facility];  Service: Cardiovascular;  Laterality: N/A;   PENILE PROSTHESIS IMPLANT     PERIPHERAL VASCULAR INTERVENTION Left 04/19/2021   Procedure: PERIPHERAL VASCULAR INTERVENTION;  Surgeon: Cherre Robins, MD;  Location: [facility];  Service: Cardiovascular;  Laterality: Left;   TOE AMPUTATION Right 2013   [facility]    Family Hx:  Family History  Problem Relation Age of Onset   Stroke Mother    Stroke Father    Cancer Father    Cancer Brother     Social History:  reports that he has never smoked. He has never used smokeless tobacco. He reports current alcohol use. He reports that he does not use drugs.  Allergies:  Allergies  Allergen Reactions   Ambien [Zolpidem] Other (See  Comments)    It causes pt to have insomnia, NOT sleep- idiosyncratic reaction    Medications: Prior to Admission medications   Medication Sig Start Date End Date Taking? Authorizing Provider  acetaminophen (TYLENOL) 325 MG tablet Take 1-2 tablets (325-650 mg total) by mouth every 4 (four) hours as needed for mild pain. 05/30/21  Yes Love, Ivan Anchors, PA-C  aspirin 81 MG EC tablet Take 1 tablet (81 mg total) by mouth daily. 04/20/21 07/19/21 Yes Dessa Phi, DO  bethanechol  (URECHOLINE) 25 MG tablet Take 1 tablet (25 mg total) by mouth 3 (three) times daily. 06/08/21  Yes Love, Ivan Anchors, PA-C  blood glucose meter kit and supplies KIT Dispense based on patient and insurance preference. Use up to four times daily as directed. (FOR ICD-9 250.00, 250.01). 11/08/15  Yes Nida, Marella Chimes, MD  Blood Glucose Monitoring Suppl (BAYER CONTOUR MONITOR) w/Device KIT 1 each by Does not apply route 4 (four) times daily. Test 4 x daily 11/08/15  Yes Nida, Marella Chimes, MD  calcitRIOL (ROCALTROL) 0.25 MCG capsule Take 1 capsule (0.25 mcg total) by mouth 2 (two) times daily. 06/08/21  Yes Love, Ivan Anchors, PA-C  cinacalcet (SENSIPAR) 30 MG tablet Take 1 tablet (30 mg total) by mouth once a week. Take on Mondays 06/08/21  Yes Love, Ivan Anchors, PA-C  clonazePAM (KLONOPIN) 0.25 MG disintegrating tablet Take 1 tablet (0.25 mg total) by mouth 2 (two) times daily as needed (Anxiety). 06/08/21  Yes Love, Ivan Anchors, PA-C  clopidogrel (PLAVIX) 75 MG tablet Take 1 tablet (75 mg total) by mouth daily. 06/08/21  Yes Love, Ivan Anchors, PA-C  furosemide (LASIX) 80 MG tablet Take 1 tablet (80 mg total) by mouth 2 (two) times daily. 06/08/21  Yes Love, Ivan Anchors, PA-C  glucose blood (BAYER CONTOUR TEST) test strip Use as instructed 4 x daily 11/08/15  Yes Nida, Marella Chimes, MD  Insulin Glargine (BASAGLAR KWIKPEN) 100 UNIT/ML Inject 5 Units into the skin at bedtime. Patient taking differently: Inject 5 Units into the skin at bedtime. Sliding scale 04/20/21 07/19/21 Yes Dessa Phi, DO  Insulin Pen Needle (B-D ULTRAFINE III SHORT PEN) 31G X 8 MM MISC 1 each by Does not apply route as directed. 09/02/15  Yes Cassandria Anger, MD  Lancets MISC 1 each by Does not apply route 4 (four) times daily. 11/08/15  Yes Nida, Marella Chimes, MD  metaxalone (SKELAXIN) 400 MG tablet Take 1 tablet (400 mg total) by mouth 3 (three) times daily as needed for muscle spasms (please offer- pt forgets to ask). 06/08/21  Yes Love, Ivan Anchors,  PA-C  midodrine (PROAMATINE) 10 MG tablet Take 0.5 tablets (5 mg total) by mouth every Monday, Wednesday, and Friday with hemodialysis. 06/08/21  Yes Love, Ivan Anchors, PA-C  multivitamin (RENA-VIT) TABS tablet Take 1 tablet by mouth at bedtime. 06/08/21  Yes Love, Ivan Anchors, PA-C  nutrition supplement, JUVEN, (JUVEN) PACK Take 1 packet by mouth 2 (two) times daily between meals. 05/18/21  Yes Little Ishikawa, MD  oxyCODONE-acetaminophen (PERCOCET/ROXICET) 5-325 MG tablet Take 1 tablet by mouth every 8 (eight) hours as needed for severe pain. 06/08/21  Yes Love, Ivan Anchors, PA-C  pantoprazole (PROTONIX) 40 MG tablet Take 1 tablet (40 mg total) by mouth daily. 05/18/21  Yes Little Ishikawa, MD  pentoxifylline (TRENTAL) 400 MG CR tablet Take 1 tablet (400 mg total) by mouth 3 (three) times daily with meals. 05/02/21  Yes Newt Minion, MD  polyethylene glycol [facility] Dt /  GLYCOLAX) 17 g packet Take 17 g by mouth daily as needed for mild constipation. 06/08/21  Yes Love, Ivan Anchors, PA-C  senna-docusate (SENOKOT-S) 8.6-50 MG tablet Take 2 tablets by mouth 2 (two) times daily. 06/08/21  Yes Love, Ivan Anchors, PA-C  tamsulosin (FLOMAX) 0.4 MG CAPS capsule Take 2 capsules (0.8 mg total) by mouth at bedtime. Patient taking differently: Take 0.4 mg by mouth 2 (two) times daily. 06/08/21  Yes Love, Ivan Anchors, PA-C  traMADol (ULTRAM) 50 MG tablet Take 1 tablet (50 mg total) by mouth every 6 (six) hours as needed for moderate pain. 06/08/21  Yes Love, Ivan Anchors, PA-C  zinc sulfate 220 (50 Zn) MG capsule Take 1 capsule (220 mg total) by mouth daily. 05/18/21  Yes Little Ishikawa, MD  atorvastatin (LIPITOR) 80 MG tablet Take 1 tablet (80 mg total) by mouth daily. Patient not taking: No sig reported 06/08/21   Love, Ivan Anchors, PA-C  gabapentin (NEURONTIN) 300 MG capsule Take 1 capsule (300 mg total) by mouth at bedtime. Patient not taking: No sig reported 06/08/21   Bary Leriche, PA-C  gentamicin cream (GARAMYCIN) 0.1 % Apply  topically daily. Patient not taking: No sig reported 05/18/21   Little Ishikawa, MD  linaclotide [facility]) 290 MCG CAPS capsule Take 1 capsule (290 mcg total) by mouth daily before breakfast. Patient not taking: No sig reported 06/08/21   Love, Ivan Anchors, PA-C  QUEtiapine (SEROQUEL) 50 MG tablet Take 1 tablet (50 mg total) by mouth at bedtime. Patient not taking: No sig reported 06/08/21   Bary Leriche, PA-C  venlafaxine XR (EFFEXOR-XR) 37.5 MG 24 hr capsule Take 37.5 mg by mouth daily. Patient not taking: No sig reported 06/12/21   [provider]    I have reviewed the patient's current and reported prior to admission medications.  Labs:  BMP Latest Ref Rng & Units 06/16/2021 06/15/2021 06/07/2021  Glucose 70 - 99 mg/dL 138(H) 133(H) 120(H)  BUN 8 - 23 mg/dL 33(H) 26(H) 45(H)  Creatinine 0.61 - 1.24 mg/dL 5.37(H) 4.75(H) 5.74(H)  Sodium 135 - 145 mmol/L 139 140 131(L)  Potassium 3.5 - 5.1 mmol/L 4.4 4.5 4.3  Chloride 98 - 111 mmol/L 99 96(L) 94(L)  CO2 22 - 32 mmol/L _0 Calcium 8.9 - 10.3 mg/dL 9.2 9.4 9.1    Urinalysis    Component Value Date/Time   COLORURINE YELLOW 06/16/2021 0630   APPEARANCEUR TURBID (A) 06/16/2021 0630   LABSPEC 1.012 06/16/2021 0630   PHURINE 7.0 06/16/2021 0630   GLUCOSEU NEGATIVE 06/16/2021 0630   HGBUR SMALL (A) 06/16/2021 0630   BILIRUBINUR NEGATIVE 06/16/2021 0630   KETONESUR NEGATIVE 06/16/2021 0630   PROTEINUR 100 (A) 06/16/2021 0630   NITRITE NEGATIVE 06/16/2021 0630   LEUKOCYTESUR MODERATE (A) 06/16/2021 0630     ROS:  Pertinent items noted in HPI and remainder of comprehensive ROS otherwise negative.   Physical Exam: Vitals:   06/16/21 1000 06/16/21 1100  BP: 128/74 (!) 142/82  Pulse: 96 97  Resp: (!) 21 (!) 25  Temp:    SpO2: 100% 100%     General: adult male in bed in NAD HEENT: NCAT Eyes: EOMI sclera anicteric Neck: supple trachea midline Heart: S1S2 no rub Lungs: clear to auscultation; normal work of  breathing at rest; on room air Abdomen: soft/NT/ND; PD catheter with dressing clean Extremities: left BKA; no edema Skin: no rash on extremities exposed Neuro: alert and oriented x 3 provides hx and follows commands  Access LUE AVF bruit and thrill   Outpatient Rx:  [place]  MWF [phone] 3 hours BF 400 /DF 500 2K/2.5 ca EDW - 86.5 kg; departure weight was 83.1 kg on 8/10 Meds: heparin 2000 units bolus; mircera 150 mcg last given 6/24 (had at [facility] in interim); venofer 100 mcg every tx; no hectoral or calcitriol    Assessment/Plan:  # End-stage renal disease on hemodialysis. - Usual schedule is Monday Wednesday Friday - Due to staffing emergencies patient may not be able to get dialysis today.  If not will have HD tomorrow. - Will need PD catheter out in [place] with his established surgeon as an outpatient  # UTI  - abx per primary team   # Urinary retention - on flomax - s/p in/out cath and in setting of UTI  # Hypertension - controlled.  Note history of hypertension and patient is now on midodrine before dialysis per  home medication list  # Anemia CKD - no acute indication for PRBC's - aranesp 100 mcg today   # Metabolic bone disease - no activated vit D at outpatient unit (but was on at home); calcitriol 0.5 mcg three times a week for now. Note auryxia stopped last admit as phos low  Disposition per primary team  Claudia Desanctis 06/16/2021, 12:56 PM

## 2021-06-16 NOTE — ED Notes (Signed)
Pt given diet sprite and crackers per pt request

## 2021-06-16 NOTE — Progress Notes (Signed)
[facility] Progress Note  Patient: Maxwell Harmon    XBD:[id number]  DOA: 06/15/2021     Date of Service: the patient was seen and examined on 06/16/2021  Brief hospital course: PMH of GERD, ESRD on HD MWF, history of PD still has catheter, intermittent self catheterization, CAD SP CABG in 2021, PAD, HTN, left BKA 05/12/2021.  Presents to the hospital with blood in the urine with fever and chills ongoing for last 4 days with nausea and diaphoresis.  Concern with UTI.  Currently on IV antibiotics. Currently plan is monitor cultures.  Subjective: Reports anxiety.  No nausea no vomiting but no fever no chills.  No abdominal pain.  In and out catheterization x1 this morning.  Has not urinated after that.  No diarrhea.  No constipation.  Assessment and Plan: 1.  Complicated UTI Reports purulent bloody discharge.  Currently no urination. Will continue bladder scan. Once a day, self catheterizations. Will require follow-up with urology, patient has seen Dr. Alinda Money in the past. Currently on IV ceftriaxone.  Urine culture blood culture pending. Likely can go home on Sunday as long as remains stable.  2.  ESRD on HD MWF History of PD, has PD catheter Nephrology consulted.  Appreciate assistance. Continue HD MWF, currently no indication for urgent HD. Patient will follow-up with primary surgeon in [place] who has performed PD catheter placement.  3.  CAD SP CABG HLD On aspirin and Plavix. Continue. Lipitor chronically on hold.  4.  Type 2 diabetes mellitus, controlled with renal complication without long-term insulin use Continue sliding scale insulin.  5.  GERD Continue PPI  6.  Stage I sacral ulcer, POA Continue foam dressing.  Scheduled Meds:  aspirin EC  81 mg Oral Daily   [START ON 06/19/2021] calcitRIOL  0.5 mcg Oral Q M,W,F-HD   [START ON 06/17/2021] Chlorhexidine Gluconate Cloth  6 each Topical Q0600   clopidogrel  75 mg Oral Daily   [START ON 06/23/2021] darbepoetin  ([name]) injection - DIALYSIS  100 mcg Intravenous Q Fri-HD   heparin  5,000 Units Subcutaneous Q8H   insulin aspart  0-9 Units Subcutaneous TID AC & HS   insulin glargine-yfgn  5 Units Subcutaneous QHS   midodrine  5 mg Oral Q M,W,F-HD   multivitamin  1 tablet Oral QHS   nutrition supplement (JUVEN)  1 packet Oral BID BM   pantoprazole  40 mg Oral Daily   pentoxifylline  400 mg Oral TID WC   tamsulosin  0.4 mg Oral BID   Continuous Infusions:  cefTRIAXone (ROCEPHIN)  IV     PRN Meds: acetaminophen **OR** acetaminophen, clonazePAM, metaxalone, ondansetron **OR** ondansetron (ZOFRAN) IV, polyethylene glycol, traMADol  Body mass index is 26.14 kg/m.    Pressure Injury 06/16/21 Sacrum Mid Stage 1 -  Intact skin with non-blanchable redness of a localized area usually over a bony prominence. (Active)  06/16/21 1310  Location: Sacrum  Location Orientation: Mid  Staging: Stage 1 -  Intact skin with non-blanchable redness of a localized area usually over a bony prominence.  Wound Description (Comments):   Present on Admission: Yes     DVT Prophylaxis:   heparin injection 5,000 Units Start: 06/16/21 1400    Advance goals of care discussion: Pt is Full code.  Family Communication: no family was present at bedside, at the time of interview.   Data Reviewed: I have personally reviewed and interpreted daily labs, tele strips, imaging. Hemoglobin stable.  Creatinine stable.  Electrolyte normal.  Physical Exam:  General: Appear in mild distress, no Rash; Oral Mucosa Clear, moist. no Abnormal Neck Mass Or lumps, Conjunctiva normal  Cardiovascular: S1 and S2 Present, aortic systolic  Murmur, Respiratory: good respiratory effort, Bilateral Air entry present and CTA, no Crackles, no wheezes Abdomen: Bowel Sound present, Soft and no tenderness Extremities: no Pedal edema Neurology: alert and oriented to time, place, and person affect anxious. no new focal deficit Gait not checked due to  patient safety concerns  Vitals:   06/16/21 1000 06/16/21 1100 06/16/21 1309 06/16/21 1703  BP: 128/74 (!) 142/82 (!) 146/85 134/81  Pulse: 96 97 99 100  Resp: (!) 21 (!) 25 18 18   Temp:   (!) 97.5 F (36.4 C) 97.7 F (36.5 C)  TempSrc:   Oral Oral  SpO2: 100% 100% 94% 96%  Weight:      Height:        Disposition:  Status is: Inpatient  Remains inpatient appropriate because:Ongoing diagnostic testing needed not appropriate for outpatient work up  Dispo: The patient is from: Home              Anticipated d/c is to: Home              Patient currently is not medically stable to d/c.   Difficult to place patient No        Time spent: 35 minutes. I reviewed all nursing notes, pharmacy notes, vitals, pertinent old records. I have discussed plan of care as described above with RN.  Author: Berle Mull, MD [facility] 06/16/2021 5:20 PM  To reach On-call, see care teams to locate the attending and reach out via [email or web address]. Between 7PM-7AM, please contact night-coverage If you still have difficulty reaching the attending provider, please page the [facility] (Director on Call) for [facility] on amion for assistance.

## 2021-06-16 NOTE — H&P (Signed)
Diabetes History and Physical    Maxwell Harmon:[id number] DOB: 08/31/1956 DOA: 06/15/2021  PCP: [name], DO  Patient coming from: Home   Chief Complaint:  Chief Complaint  Patient presents with   UTI     HPI:    Is a 65 year old male with past medical history of gastroesophageal reflux disease, mellitus type II, end-stage renal disease (HD MWF), coronary artery disease (S/P CABG in 2021 and NSTEMI in 2022), peripheral artery disease and hypertension who presented to [facility] emergency department with complaints of malaise, chills and purulent urine.  Patient was hospitalized at [facility] from 7/3 until 7/14 with concerns for nonhealing left foot ulcer and ongoing diabetic foot infection.  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  Patient gradually improved and arranges were made for the patient to be discharged to inpatient rehab on 7/14.  Over the course of the patient's hospitalization and rehab, patient developed intermittent bouts of urinary retention requiring intermittent catheterizations.  Patient was eventually discharged from inpatient rehabilitation on 8/4.  Patient explains that for approximate the past 4 to 5 days the patient has noted that his urine has intermittently become extremely "milky" occasionally mixed with blood.  Then, 2 days ago on 8/9 patient began to develop a sense of generalized malaise and weakness.  This was associated with frequent bouts of intense shivering and "cold sweats."  States that he has frequently had to change his clothes and sheets due to the degree of sweating.  Patient denies any actual associated fevers.  Patient is also complaining of associated nausea with one episode of vomiting over the span of time.  Patient denies any associated abdominal pain or diarrhea.  Patient denies sick contacts, recent travel or confirmed contact with COVID-19 infection.  Patient's symptoms of generalized  weakness malaise and chills with frequent bouts of diaphoresis prompted the patient to eventually present to [facility] emergency department for evaluation.  Upon evaluation in the emergency department urinalysis was performed and felt to be suggestive of a urinary tract infection.  Patient was initiated on intravenous ceftriaxone for suspected complicated urinary tract infection.  The hospitalist group was then called to assess the patient for admission to the hospital.  Review of Systems:   Review of Systems  Constitutional:  Positive for diaphoresis and malaise/fatigue.  Gastrointestinal:  Positive for nausea and vomiting.  Neurological:  Positive for weakness.  All other systems reviewed and are negative.  Past Medical History:  Diagnosis Date   Anemia of chronic disease    CAD (coronary artery disease)    Diabetes mellitus without complication (HCC)    ESRD (end stage renal disease) on dialysis ([place])    MWF in [place]   History of bleeding ulcers    History of TIAs    Hypertension    NSTEMI (non-ST elevated myocardial infarction) ([place]) 04/2021   PAD (peripheral artery disease) ([place])     Past Surgical History:  Procedure Laterality Date   ABDOMINAL AORTOGRAM W/LOWER EXTREMITY Bilateral 04/14/2021   Procedure: ABDOMINAL AORTOGRAM W/LOWER EXTREMITY;  Surgeon: Elam Dutch, MD;  Location: [place] CV [facility];  Service: Vascular;  Laterality: Bilateral;   AMPUTATION Left 05/12/2021   Procedure: LEFT BELOW KNEE AMPUTATION;  Surgeon: Newt Minion, MD;  Location: [place];  Service: Orthopedics;  Laterality: Left;   CATARACT EXTRACTION W/PHACO Left 02/15/2014   Procedure: CATARACT EXTRACTION PHACO AND INTRAOCULAR LENS PLACEMENT (IOC);  Surgeon: Tonny Branch, MD;  Location:  [facility];  Service: Ophthalmology;  Laterality: Left;  CDE 10.84   CATARACT EXTRACTION W/PHACO Right 12/14/2013   Procedure: CATARACT EXTRACTION PHACO AND INTRAOCULAR LENS PLACEMENT (IOC);  Surgeon: Tonny Branch, MD;  Location: [facility];  Service: Ophthalmology;  Laterality: Right;  CDE:  16.30   CHOLECYSTECTOMY  02/2013   EYE SURGERY     IR FLUORO GUIDE CV LINE RIGHT  01/05/2019   IR US GUIDE VASC ACCESS RIGHT  01/05/2019   LAPAROSCOPIC NEPHRECTOMY Right 01/08/2019   Procedure: LAPAROSCOPIC RADICAL NEPHRECTOMY;  Surgeon: Raynelle Bring, MD;  Location: [facility];  Service: Urology;  Laterality: Right;   LEFT HEART CATH AND CORS/GRAFTS ANGIOGRAPHY N/A 04/14/2021   Procedure: LEFT HEART CATH AND CORS/GRAFTS ANGIOGRAPHY;  Surgeon: Jolaine Artist, MD;  Location: [place] [facility];  Service: Cardiovascular;  Laterality: N/A;   PENILE PROSTHESIS IMPLANT     PERIPHERAL VASCULAR INTERVENTION Left 04/19/2021   Procedure: PERIPHERAL VASCULAR INTERVENTION;  Surgeon: Cherre Robins, MD;  Location: [place] [facility];  Service: Cardiovascular;  Laterality: Left;   TOE AMPUTATION Right 2013   [facility]     reports that he has never smoked. He has never used smokeless tobacco. He reports current alcohol use. He reports that he does not use drugs.  Allergies  Allergen Reactions   Ambien [Zolpidem] Other (See Comments)    It causes pt to have insomnia, NOT sleep- idiosyncratic reaction    Family History  Problem Relation Age of Onset   Stroke Mother    Stroke Father    Cancer Father    Cancer Brother      Prior to Admission medications   Medication Sig Start Date End Date Taking? Authorizing Provider  acetaminophen (TYLENOL) 325 MG tablet Take 1-2 tablets (325-650 mg total) by mouth every 4 (four) hours as needed for mild pain. 05/30/21  Yes Love, Ivan Anchors, PA-C  aspirin 81 MG EC tablet Take 1 tablet (81 mg total) by mouth daily. 04/20/21 07/19/21 Yes Dessa Phi, DO  bethanechol (URECHOLINE) 25 MG tablet Take 1 tablet (25 mg total) by mouth 3 (three) times daily. 06/08/21  Yes Love, Ivan Anchors, PA-C  blood glucose meter kit and supplies KIT Dispense based on patient and insurance  preference. Use up to four times daily as directed. (FOR ICD-9 250.00, 250.01). 11/08/15  Yes Nida, Marella Chimes, MD  Blood Glucose Monitoring Suppl (BAYER CONTOUR MONITOR) w/Device KIT 1 each by Does not apply route 4 (four) times daily. Test 4 x daily 11/08/15  Yes Nida, Marella Chimes, MD  calcitRIOL (ROCALTROL) 0.25 MCG capsule Take 1 capsule (0.25 mcg total) by mouth 2 (two) times daily. 06/08/21  Yes Love, Ivan Anchors, PA-C  cinacalcet (SENSIPAR) 30 MG tablet Take 1 tablet (30 mg total) by mouth once a week. Take on Mondays 06/08/21  Yes Love, Ivan Anchors, PA-C  clonazePAM (KLONOPIN) 0.25 MG disintegrating tablet Take 1 tablet (0.25 mg total) by mouth 2 (two) times daily as needed (Anxiety). 06/08/21  Yes Love, Ivan Anchors, PA-C  clopidogrel (PLAVIX) 75 MG tablet Take 1 tablet (75 mg total) by mouth daily. 06/08/21  Yes Love, Ivan Anchors, PA-C  furosemide (LASIX) 80 MG tablet Take 1 tablet (80 mg total) by mouth 2 (two) times daily. 06/08/21  Yes Love, Ivan Anchors, PA-C  glucose blood (BAYER CONTOUR TEST) test strip Use as instructed 4 x daily 11/08/15  Yes Nida, Marella Chimes, MD  Insulin Glargine (BASAGLAR KWIKPEN) 100 UNIT/ML Inject 5 Units  into the skin at bedtime. Patient taking differently: Inject 5 Units into the skin at bedtime. Sliding scale 04/20/21 07/19/21 Yes Dessa Phi, DO  Insulin Pen Needle (B-D ULTRAFINE III SHORT PEN) 31G X 8 MM MISC 1 each by Does not apply route as directed. 09/02/15  Yes Cassandria Anger, MD  Lancets MISC 1 each by Does not apply route 4 (four) times daily. 11/08/15  Yes Nida, Marella Chimes, MD  metaxalone (SKELAXIN) 400 MG tablet Take 1 tablet (400 mg total) by mouth 3 (three) times daily as needed for muscle spasms (please offer- pt forgets to ask). 06/08/21  Yes Love, Ivan Anchors, PA-C  midodrine (PROAMATINE) 10 MG tablet Take 0.5 tablets (5 mg total) by mouth every Monday, Wednesday, and Friday with hemodialysis. 06/08/21  Yes Love, Ivan Anchors, PA-C  multivitamin (RENA-VIT) TABS  tablet Take 1 tablet by mouth at bedtime. 06/08/21  Yes Love, Ivan Anchors, PA-C  nutrition supplement, JUVEN, (JUVEN) PACK Take 1 packet by mouth 2 (two) times daily between meals. 05/18/21  Yes Little Ishikawa, MD  oxyCODONE-acetaminophen (PERCOCET/ROXICET) 5-325 MG tablet Take 1 tablet by mouth every 8 (eight) hours as needed for severe pain. 06/08/21  Yes Love, Ivan Anchors, PA-C  pantoprazole (PROTONIX) 40 MG tablet Take 1 tablet (40 mg total) by mouth daily. 05/18/21  Yes Little Ishikawa, MD  pentoxifylline (TRENTAL) 400 MG CR tablet Take 1 tablet (400 mg total) by mouth 3 (three) times daily with meals. 05/02/21  Yes Newt Minion, MD  polyethylene glycol (MIRALAX / GLYCOLAX) 17 g packet Take 17 g by mouth daily as needed for mild constipation. 06/08/21  Yes Love, Ivan Anchors, PA-C  senna-docusate (SENOKOT-S) 8.6-50 MG tablet Take 2 tablets by mouth 2 (two) times daily. 06/08/21  Yes Love, Ivan Anchors, PA-C  tamsulosin (FLOMAX) 0.4 MG CAPS capsule Take 2 capsules (0.8 mg total) by mouth at bedtime. Patient taking differently: Take 0.4 mg by mouth 2 (two) times daily. 06/08/21  Yes Love, Ivan Anchors, PA-C  traMADol (ULTRAM) 50 MG tablet Take 1 tablet (50 mg total) by mouth every 6 (six) hours as needed for moderate pain. 06/08/21  Yes Love, Ivan Anchors, PA-C  zinc sulfate 220 (50 Zn) MG capsule Take 1 capsule (220 mg total) by mouth daily. 05/18/21  Yes Little Ishikawa, MD  atorvastatin (LIPITOR) 80 MG tablet Take 1 tablet (80 mg total) by mouth daily. Patient not taking: No sig reported 06/08/21   Love, Ivan Anchors, PA-C  gabapentin (NEURONTIN) 300 MG capsule Take 1 capsule (300 mg total) by mouth at bedtime. Patient not taking: No sig reported 06/08/21   Bary Leriche, PA-C  gentamicin cream (GARAMYCIN) 0.1 % Apply topically daily. Patient not taking: No sig reported 05/18/21   Little Ishikawa, MD  linaclotide [facility]) 290 MCG CAPS capsule Take 1 capsule (290 mcg total) by mouth daily before breakfast. Patient  not taking: No sig reported 06/08/21   Love, Ivan Anchors, PA-C  QUEtiapine (SEROQUEL) 50 MG tablet Take 1 tablet (50 mg total) by mouth at bedtime. Patient not taking: No sig reported 06/08/21   Bary Leriche, PA-C  venlafaxine XR (EFFEXOR-XR) 37.5 MG 24 hr capsule Take 37.5 mg by mouth daily. Patient not taking: No sig reported 06/12/21   [provider]    Physical Exam: Vitals:   06/15/21 2200 06/15/21 2230 06/15/21 2245 06/15/21 2300  BP: (!) 144/85 135/82 131/72 132/77  Pulse: 99 100 100 (!) 101  Resp:  Temp:      TempSrc:      SpO2: 97% 97% 99% 99%  Weight:      Height:       Constitutional: Awake alert and oriented x3, no associated distress.   Skin: Surgical wound of the left BKA site is somewhat erythematous without induration or significant warmth or discharge.  Slightly poor skin turgor otherwise. Eyes: Pupils are equally reactive to light.  No evidence of scleral icterus or conjunctival pallor.  ENMT: Moist mucous membranes noted.  Posterior pharynx clear of any exudate or lesions.   Neck: normal, supple, no masses, no thyromegaly.  No evidence of jugular venous distension.   Respiratory: Notable bibasilar rales.  No evidence of associated wheezing.  . Normal respiratory effort. No accessory muscle use.  Cardiovascular: Regular rate and rhythm, no murmurs / rubs / gallops. No extremity edema. 2+ pedal pulses. No carotid bruits.  Chest:   Nontender without crepitus or deformity.   Back:   Nontender without crepitus or deformity. Abdomen: Peritoneal dialysis catheter identified in the anterior abdominal wall.  Abdomen is soft and nontender.  No evidence of intra-abdominal masses.  Positive bowel sounds noted in all quadrants.   Musculoskeletal: Notable left BKA.  No joint deformity upper and lower extremities. Good ROM, no contractures. Normal muscle tone.  Neurologic: CN 2-12 grossly intact. Sensation intact.  Patient moving all 4 extremities spontaneously.  Patient is  following all commands.  Patient is responsive to verbal stimuli.   Psychiatric: Patient exhibits angry mood with flat affect.  Patient seems to possess insight as to their current situation.     Labs on Admission: I have personally reviewed following labs and imaging studies -   CBC: Recent Labs  Lab 06/15/21 1542 06/15/21 2000  WBC 7.3 7.5  NEUTROABS 5.0  --   HGB 10.4* 10.5*  HCT 35.2* 35.5*  MCV 109.7* 111.3*  PLT 174 161   Basic Metabolic Panel: Recent Labs  Lab 06/15/21 1542  NA 140  K 4.5  CL 96*  CO2 31  GLUCOSE 133*  BUN 26*  CREATININE 4.75*  CALCIUM 9.4   GFR: Estimated Creatinine Clearance: 16.7 mL/min (A) (by C-G formula based on SCr of 4.75 mg/dL (H)). Liver Function Tests: Recent Labs  Lab 06/15/21 1542  AST 32  ALT 19  ALKPHOS 116  BILITOT 0.8  PROT 6.8  ALBUMIN 3.0*   Recent Labs  Lab 06/15/21 1542  LIPASE 36   No results for input(s): AMMONIA in the last 168 hours. Coagulation Profile: No results for input(s): INR, PROTIME in the last 168 hours. Cardiac Enzymes: No results for input(s): CKTOTAL, CKMB, CKMBINDEX, TROPONINI in the last 168 hours. BNP (last 3 results) No results for input(s): PROBNP in the last 8760 hours. HbA1C: No results for input(s): HGBA1C in the last 72 hours. CBG: Recent Labs  Lab 06/15/21 2030  GLUCAP 117*   Lipid Profile: No results for input(s): CHOL, HDL, LDLCALC, TRIG, CHOLHDL, LDLDIRECT in the last 72 hours. Thyroid Function Tests: No results for input(s): TSH, T4TOTAL, FREET4, T3FREE, THYROIDAB in the last 72 hours. Anemia Panel: No results for input(s): VITAMINB12, FOLATE, FERRITIN, TIBC, IRON, RETICCTPCT in the last 72 hours. Urine analysis:    Component Value Date/Time   COLORURINE AMBER (A) 06/15/2021 1945   APPEARANCEUR TURBID (A) 06/15/2021 1945   LABSPEC 1.013 06/15/2021 1945   PHURINE 8.0 06/15/2021 1945   GLUCOSEU NEGATIVE 06/15/2021 1945   HGBUR SMALL (A) 06/15/2021 1945    BILIRUBINUR  NEGATIVE 06/15/2021 [place] NEGATIVE 06/15/2021 1945   PROTEINUR 100 (A) 06/15/2021 1945   NITRITE NEGATIVE 06/15/2021 1945   LEUKOCYTESUR LARGE (A) 06/15/2021 1945    Radiological Exams on Admission - Personally Reviewed: DG Chest Port 1 View  Result Date: 06/15/2021 CLINICAL DATA:  Weakness EXAM: PORTABLE CHEST 1 VIEW COMPARISON:  05/20/2021 FINDINGS: Previous median sternotomy. Chronic cardiomegaly. Enlarging bilateral effusions with lower lobe atelectasis and or pneumonia. Upper lungs remain largely clear. IMPRESSION: Enlarging effusions and worsening lower lobe atelectasis and or pneumonia. Electronically Signed   By: Nelson Chimes M.D.   On: 06/15/2021 20:16    Telemetry: Personally reviewed.  Rhythm is normal sinus rhythm.  Assessment/Plan Principal Problem:   Complicated UTI (urinary tract infection)  Patient presenting with several days of purulent bloody urine bring about clinical concern for complicated urinary tract infection considering patient self catheterizes himself daily. Abdomen soft and nontender on exam.  Furthermore there is no clinical evidence of sepsis.  We will therefore abstain from CT abdominal imaging at this point. Patient has been initiated on intravenous ceftriaxone by the emergency department staff which will be continued at this time. Attempting to maintain euvolemia in this patient with end-stage renal disease.  Will consider small measured boluses of fluid if patient becomes hypotensive. Blood and urine cultures obtained  Active Problems:   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 ([place])  Accu-Cheks before every meal and nightly with sliding scale insulin Hemoglobin A1c pending Continue home regimen of low-dose Lantus nightly.    End-stage renal disease on hemodialysis [facility])  Patient receives hemodialysis Monday Wednesday and Friday Will consult nephrology in the morning for resumption  of dialysis services Of note, patient has a peritoneal dialysis catheter and no longer receives peritoneal dialysis.  Will consider contacting interventional radiology for removal of this catheter while here to minimize risk of eventual infection.    Urinary retention  Patient apparently had recurrent bouts of urinary retention during his prolonged hospitalization in July and is now intermittently cathing himself at least once daily. Occasionally, patient is able to spontaneously void. Continuing home regimen of Flomax twice daily Will perform as needed bladder scans while here and perform serial intermittent catheterizations as necessary    Coronary artery disease involving native heart without angina pectoris  Continue home regimen of aspirin and Plavix Apparently, patient's home regimen of atorvastatin has been temporarily held by patient's nephrologist Chest pain-free Monitoring patient on telemetry    GERD without esophagitis  Continue home regimen of daily protohave inhibitor   Code Status:  Full code Family Communication: Deferred   Status is: Observation  The patient remains OBS appropriate and will d/c before 2 midnights.  Dispo: The patient is from: Home              Anticipated d/c is to: Home              Patient currently is not medically stable to d/c.   Difficult to place patient No        Vernelle Emerald MD [facility] Pager [phone]  If 7PM-7AM, please contact night-coverage [email or web address] Use universal [place] password for that web site. If you do not have the password, please call the hospital operator.  06/16/2021, 12:37 AM

## 2021-06-17 LAB — RENAL FUNCTION PANEL
Albumin: 2.4 g/dL — ABNORMAL LOW (ref 3.5–5.0)
Anion gap: 11 (ref 5–15)
BUN: 49 mg/dL — ABNORMAL HIGH (ref 8–23)
CO2: 27 mmol/L (ref 22–32)
Calcium: 8.9 mg/dL (ref 8.9–10.3)
Chloride: 100 mmol/L (ref 98–111)
Creatinine, Ser: 6.31 mg/dL — ABNORMAL HIGH (ref 0.61–1.24)
GFR, Estimated: 9 mL/min — ABNORMAL LOW (ref >60)
Glucose, Bld: 132 mg/dL — ABNORMAL HIGH (ref 70–99)
Phosphorus: 6.1 mg/dL — ABNORMAL HIGH (ref 2.5–4.6)
Potassium: 4.2 mmol/L (ref 3.5–5.1)
Sodium: 138 mmol/L (ref 135–145)

## 2021-06-17 LAB — GLUCOSE, CAPILLARY
Glucose-Capillary: 77 mg/dL (ref 70–99)
Glucose-Capillary: 84 mg/dL (ref 70–99)
Glucose-Capillary: 190 mg/dL — ABNORMAL HIGH (ref 70–99)
Glucose-Capillary: 140 mg/dL — ABNORMAL HIGH (ref 70–99)

## 2021-06-17 LAB — CBC
HCT: 31.3 % — ABNORMAL LOW (ref 39.0–52.0)
Hemoglobin: 9.5 g/dL — ABNORMAL LOW (ref 13.0–17.0)
MCH: 32.5 pg (ref 26.0–34.0)
MCHC: 30.4 g/dL (ref 30.0–36.0)
MCV: 107.2 fL — ABNORMAL HIGH (ref 80.0–100.0)
Platelets: 160 10*3/uL (ref 150–400)
RBC: 2.92 MIL/uL — ABNORMAL LOW (ref 4.22–5.81)
RDW: 17.8 % — ABNORMAL HIGH (ref 11.5–15.5)
WBC: 7.9 10*3/uL (ref 4.0–10.5)
nRBC: 0 % (ref 0.0–0.2)

## 2021-06-17 MED ORDER — LIDOCAINE HCL (PF) 1 % IJ SOLN: 5.0000 mL | INTRAMUSCULAR | Status: DC | PRN

## 2021-06-17 MED ORDER — SODIUM CHLORIDE 0.9 % IV SOLN: 100.0000 mL | INTRAVENOUS | Status: DC | PRN

## 2021-06-17 MED ORDER — CLONAZEPAM 0.25 MG PO TBDP
0.5000 mg | ORAL_TABLET | Freq: Two times a day (BID) | ORAL | Status: DC | PRN
  Administered 2021-06-17: 0.5 mg via ORAL
  Filled 2021-06-17: qty 2

## 2021-06-17 MED ORDER — HEPARIN SODIUM (PORCINE) 1000 UNIT/ML DIALYSIS: 1000.0000 [IU] | INTRAMUSCULAR | Status: DC | PRN

## 2021-06-17 MED ORDER — LIDOCAINE-PRILOCAINE 2.5-2.5 % EX CREA
1.0000 | TOPICAL_CREAM | CUTANEOUS | Status: DC | PRN
Start: 2021-06-17 — End: 2021-06-17

## 2021-06-17 MED ORDER — DARBEPOETIN ALFA 100 MCG/0.5ML IJ SOSY
PREFILLED_SYRINGE | INTRAMUSCULAR | Status: AC
  Filled 2021-06-17: qty 0.5

## 2021-06-17 MED ORDER — ALTEPLASE 2 MG IJ SOLR: 2.0000 mg | Freq: Once | INTRAMUSCULAR | Status: DC | PRN

## 2021-06-17 MED ORDER — PENTAFLUOROPROP-TETRAFLUOROETH EX AERO: 1.0000 "application " | INHALATION_SPRAY | CUTANEOUS | Status: DC | PRN

## 2021-06-17 MED ORDER — DARBEPOETIN ALFA 100 MCG/0.5ML IJ SOSY
100.0000 ug | PREFILLED_SYRINGE | INTRAMUSCULAR | Status: DC
  Administered 2021-06-17: 100 ug via INTRAVENOUS

## 2021-06-17 NOTE — Progress Notes (Signed)
Nurse tech performed bladder scan while md at bedside. tech reported 0 volume noted

## 2021-06-17 NOTE — Progress Notes (Signed)
HD nurse rebecca responded and informed this rn she spoke to nephrology md and PA who both explain pt soft swelling at graft site r/t a previous infiltration and no new event. Received instructions from rebecca to apply cold compress for increase in size and or new onset of pain.

## 2021-06-17 NOTE — Progress Notes (Signed)
[facility] Progress Note  Name: Maxwell Harmon MRN: [id number] DOB: 1956/08/02  Chief Complaint:  Cold sweats   Subjective:  Seen and examined on dialysis.  Blood pressure 138/72 and HR 84.  Tolerating goal.  Increased UF goal by 0.5 to 1 kg as tolerated as patient states short of breath and has been on 2 liters oxygen. LUE AVF in use.  He feels better now.  ------------ Background on consult:  Maxwell Harmon is a 66 y.o. male with a history of ESRD on HD Monday Wednesday Friday in [place], diabetes mellitus, coronary artery disease, hypertension, and peripheral artery disease who presented to the hospital with cold sweats and purulent urine.  Does still make some urine.  No foley.  Some burning with urination.  No n/v and no flank pain.  He is to be admitted by the hospitalist service and they have consulted nephrology for assistance with management of dialysis.  He was previously on PD and had issues with inadequate drainage.  Covid negative.  Hasn't been able to set up PD catether removal yet.  Recently here with month long hospital stay and left BKA with rehab afterward.   Intake/Output Summary (Last 24 hours) at 06/17/2021 0958 Last data filed at 06/17/2021 5329 Gross per 24 hour  Intake --  Output 320 ml  Net -320 ml    Vitals:  Vitals:   06/17/21 0814 06/17/21 0821 06/17/21 0900 06/17/21 0930  BP: (!) 147/60 136/72 (!) 134/57 (!) 145/72  Pulse: 86 85 86 86  Resp: (!) 21 20 20  (!) 24  Temp: (!) 97.5 F (36.4 C)     TempSrc: Oral     SpO2: 100%     Weight: 84.5 kg     Height:         Physical Exam:  General: adult male in bed in NAD HEENT: NCAT Eyes: EOMI sclera anicteric Neck: supple trachea midline Heart: S1S2 no rub Lungs: clear to auscultation; normal work of breathing at rest; on 2 liters Abdomen: soft/NT/ND; PD catheter with dressing clean Extremities: left BKA; no edema Skin: no rash on extremities exposed Neuro: alert and oriented x 3  provides hx and follows commands Access LUE AVF in use  Medications reviewed   Labs:  BMP Latest Ref Rng & Units 06/17/2021 06/16/2021 06/15/2021  Glucose 70 - 99 mg/dL 132(H) 138(H) 133(H)  BUN 8 - 23 mg/dL 49(H) 33(H) 26(H)  Creatinine 0.61 - 1.24 mg/dL 6.31(H) 5.37(H) 4.75(H)  Sodium 135 - 145 mmol/L 138 139 140  Potassium 3.5 - 5.1 mmol/L 4.2 4.4 4.5  Chloride 98 - 111 mmol/L 100 99 96(L)  CO2 22 - 32 mmol/L 27 29 31   Calcium 8.9 - 10.3 mg/dL 8.9 9.2 9.4   Outpatient Rx:  [place]  MWF [phone] 3 hours BF 400 /DF 500 2K/2.5 ca EDW - 86.5 kg; departure weight was 83.1 kg on 8/10 Meds: heparin 2000 units bolus; mircera 150 mcg last given 6/24 (had at [facility] in interim); venofer 100 mcg every tx; no hectoral or calcitriol    Assessment/Plan:   # End-stage renal disease on hemodialysis. - Usual schedule is Monday Wednesday Friday - HD today off schedule due to staffing then back to MWF for next tx which may be outpatient per team - Will need PD catheter out in [facility] with his established surgeon as an outpatient   # UTI  - abx per primary team    # Urinary retention - on flomax - s/p  in/out cath and in setting of UTI   # Hypertension - controlled.  Note history of hypertension and patient is now on midodrine before dialysis    # Anemia CKD - no acute indication for PRBC's - aranesp 100 mcg not given on 8/12 - give on on 5/69   # Metabolic bone disease - no activated vit D at outpatient unit (but was on at home); calcitriol 0.5 mcg three times a week for now. Note auryxia stopped last admit as phos low   Disposition per primary team    Claudia Desanctis, MD 06/17/2021 10:07 AM

## 2021-06-17 NOTE — Progress Notes (Addendum)
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  Presents to the hospital with blood in the urine with fever and chills ongoing for last 4 days with nausea and diaphoresis.  Concern with UTI.  Currently on IV antibiotics. Currently plan is monitor cultures.  Subjective: No acute complaint.  Occasional fullness in the bladder area.  No nausea no vomiting.  No fever no chills.  No diarrhea constipation.  No bleeding anywhere.  Assessment and Plan: 1.  Complicated UTI Reports purulent bloody discharge.  Currently no urination. Bladder scan shows less than 100 mL. Once a day, self catheterizations. Will require follow-up with urology, patient has seen Dr. Alinda Money in the past. Currently on IV ceftriaxone.  Urine culture growing Klebsiella pneumoniae.  Blood culture currently negative for 48 hours. Likely can go home on Sunday once sensitivities are available.  2.  ESRD on HD MWF History of PD, has PD catheter Anemia chronic kidney disease Nephrology consulted.  Appreciate assistance. Continue HD MWF, currently no indication for urgent HD. Patient will follow-up with primary surgeon in [place] who has performed PD catheter placement.  3.  CAD SP CABG HLD On aspirin and Plavix. Continue. Lipitor chronically on hold.  4.  Type 2 diabetes mellitus, controlled with renal complication without long-term insulin use Continue sliding scale insulin.  5.  GERD Continue PPI  6.  Stage I sacral ulcer, POA Continue foam dressing.  7.  Bilateral trace pleural effusion. Continue HD and volume removal. Read is mention of possible pneumonia although patient clinically does not have any evidence of pneumonia and  exam appears normal and currently on room air. Already on antibiotic.  8.  Chronic anxiety. Patient is on Klonopin 0.5 mg twice daily as needed. In the hospital 0.25 mg twice daily as needed.  We will increase the dose to home regimen.  Scheduled Meds:  aspirin EC  81 mg Oral Daily   [START ON 06/19/2021] calcitRIOL  0.5 mcg Oral Q M,W,F-HD   Chlorhexidine Gluconate Cloth  6 each Topical Q0600   clopidogrel  75 mg Oral Daily   Darbepoetin Alfa       darbepoetin ([name]) injection - DIALYSIS  100 mcg Intravenous Q Sat-HD   heparin  5,000 Units Subcutaneous Q8H   insulin aspart  0-9 Units Subcutaneous TID AC & HS   insulin glargine-yfgn  5 Units Subcutaneous QHS   midodrine  5 mg Oral Q M,W,F-HD   multivitamin  1 tablet Oral QHS   nutrition supplement (JUVEN)  1 packet Oral BID BM   pantoprazole  40 mg Oral Daily   pentoxifylline  400 mg Oral TID WC   tamsulosin  0.4 mg Oral BID   Continuous Infusions:  cefTRIAXone (ROCEPHIN)  IV 1 g (06/16/21 2214)   PRN Meds: acetaminophen **OR** acetaminophen, clonazePAM, metaxalone, ondansetron **OR** ondansetron (ZOFRAN) IV, polyethylene glycol, traMADol  Body mass index is 25.4 kg/m.    Pressure Injury 06/16/21 Sacrum Mid Stage 1 -  Intact skin with non-blanchable redness of a localized area usually over a bony prominence. (Active)  06/16/21 1310  Location: Sacrum  Location Orientation: Mid  Staging: Stage 1 -  Intact skin with non-blanchable redness of a localized  area usually over a bony prominence.  Wound Description (Comments):   Present on Admission: Yes     DVT Prophylaxis:   heparin injection 5,000 Units Start: 06/16/21 1400    Advance goals of care discussion: Pt is Full code.  Family Communication: family was present at bedside, at the time of interview.   Data Reviewed: I have personally reviewed and interpreted daily labs, tele strips, imaging. Electrolytes stable.  Creatinine stable.  Hemoglobin 9.5.  Physical  Exam:  General: Appear in mild distress, no Rash; Oral Mucosa Clear, moist. no Abnormal Neck Mass Or lumps, Conjunctiva normal  Cardiovascular: S1 and S2 Present, no Murmur, Respiratory: good respiratory effort, Bilateral Air entry present and CTA, no Crackles, no wheezes Abdomen: Bowel Sound present, Soft and no tenderness Extremities: no Pedal edema Neurology: alert and oriented to time, place, and person affect appropriate. no new focal deficit Gait not checked due to patient safety concerns   Vitals:   06/17/21 1123 06/17/21 1130 06/17/21 1240 06/17/21 1806  BP: (!) 138/43 135/61 (!) 148/79 114/62  Pulse: 89 90 92 93  Resp: 19 19 16 16   Temp: (!) 97.3 F (36.3 C) (!) 97.3 F (36.3 C) 98 F (36.7 C) 98.6 F (37 C)  TempSrc:   Oral Oral  SpO2:  98% 93% (!) 89%  Weight:  82.6 kg    Height:        Disposition:  Status is: Inpatient  Remains inpatient appropriate because:Ongoing diagnostic testing needed not appropriate for outpatient work up  Dispo: The patient is from: Home              Anticipated d/c is to: Home              Patient currently is not medically stable to d/c.   Difficult to place patient No        Time spent: 35 minutes. I reviewed all nursing notes, pharmacy notes, vitals, pertinent old records. I have discussed plan of care as described above with RN.  Author: Berle Mull, MD [facility] 06/17/2021 6:33 PM  To reach On-call, see care teams to locate the attending and reach out via [email or web address]. Between 7PM-7AM, please contact night-coverage If you still have difficulty reaching the attending provider, please page the [facility] (Director on Call) for [facility] on amion for assistance.

## 2021-06-17 NOTE — Progress Notes (Signed)
This rn called HD nurse rebecca to inform soft swelling noted at HD site, thrill/bruit present. pt denies pain. Wells Guiles will have HD staff come to eval.

## 2021-06-18 LAB — GLUCOSE, CAPILLARY
Glucose-Capillary: 127 mg/dL — ABNORMAL HIGH (ref 70–99)
Glucose-Capillary: 145 mg/dL — ABNORMAL HIGH (ref 70–99)

## 2021-06-18 LAB — URINE CULTURE
Culture: >=100000 — AB
Culture: >=100000 — AB

## 2021-06-18 MED ORDER — CEPHALEXIN 500 MG PO CAPS: 500.0000 mg | ORAL_CAPSULE | Freq: Two times a day (BID) | ORAL | 0 refills | Status: AC

## 2021-06-18 MED ORDER — JUVEN PO PACK: 1.0000 | PACK | Freq: Two times a day (BID) | ORAL | 0 refills | Status: AC

## 2021-06-18 NOTE — TOC Transition Note (Signed)
Transition of Care [facility]) - CM/SW Discharge Note   Patient Details  Name: DEANTA MINCEY MRN: [id number] Date of Birth: [birth year]  Transition of Care [facility]) CM/SW Contact:  Bartholomew Crews, RN Phone Number: [phone] 06/18/2021, 11:28 AM   Clinical Narrative:     Notified by nursing of patient's readiness to transition home. Patient verbalized to nurse that he has home health services for nursing and PT. Verified with Malachy Mood at [facility] that patient is active with [place] office. [place] home health orders placed. No further TOC needs identified at this time.   Final next level of care: Home w Home Health Services Barriers to Discharge: No Barriers Identified   Patient Goals and CMS Choice        Discharge Placement                       Discharge Plan and Services                          HH Arranged: PT, RN [facility] Agency: [place] Date [place]: 06/18/21 Time [place]: 1127 Representative spoke with at [place]: [place] ([place]) Interventions     Readmission Risk Interventions Readmission Risk Prevention Plan 04/20/2021  Transportation Screening Complete  PCP or Specialist Appt within 3-5 Days Complete  HRI or [place] Complete  Social Work Consult for [place] Planning/Counseling Complete  Palliative Care Screening Not Applicable  Medication Review [employer]) Complete  Some recent data might be hidden

## 2021-06-18 NOTE — Progress Notes (Signed)
[facility] Progress Note  Name: Maxwell Harmon MRN: [id number] DOB: 06/22/1956  Chief Complaint:  Cold sweats   Subjective:  Feels better now and off of oxygen. States he is going home today - his wife is coming later. No cold sweats or burning with urination or discharge anymore.   Review of systems:  Denies shortness of breath or chest pain  Denies n/v   ------------ Background on consult:  Maxwell Harmon is a 65 y.o. male with a history of ESRD on HD Monday Wednesday Friday in [place], diabetes mellitus, coronary artery disease, hypertension, and peripheral artery disease who presented to the hospital with cold sweats and purulent urine.  Does still make some urine.  No foley.  Some burning with urination.  No n/v and no flank pain.  He is to be admitted by the hospitalist service and they have consulted nephrology for assistance with management of dialysis.  He was previously on PD and had issues with inadequate drainage.  Covid negative.  Hasn't been able to set up PD catether removal yet.  Recently here with month long hospital stay and left BKA with rehab afterward.   Intake/Output Summary (Last 24 hours) at 06/18/2021 1016 Last data filed at 06/17/2021 2300 Gross per 24 hour  Intake --  Output 2014 ml  Net -2014 ml    Vitals:  Vitals:   06/17/21 1240 06/17/21 1806 06/17/21 2358 06/18/21 0546  BP: (!) 148/79 114/62 121/75 105/74  Pulse: 92 93 99 88  Resp: 16 16 18 18   Temp: 98 F (36.7 C) 98.6 F (37 C) 97.6 F (36.4 C) 98.3 F (36.8 C)  TempSrc: Oral Oral Oral Oral  SpO2: 93% (!) 89% 91% 95%  Weight:      Height:         Physical Exam:  General: adult male in bed in NAD HEENT: NCAT Eyes: EOMI sclera anicteric Neck: supple trachea midline Heart: S1S2 no rub Lungs: clear to auscultation; unlabored at rest; room air lying flat Abdomen: soft/NT/ND; PD catheter with dressing clean Extremities: left BKA; no edema Skin: no rash on  extremities exposed Neuro: alert and oriented x 3 provides hx and follows commands Access LUE AVF   Medications reviewed   Labs:  BMP Latest Ref Rng & Units 06/17/2021 06/16/2021 06/15/2021  Glucose 70 - 99 mg/dL 132(H) 138(H) 133(H)  BUN 8 - 23 mg/dL 49(H) 33(H) 26(H)  Creatinine 0.61 - 1.24 mg/dL 6.31(H) 5.37(H) 4.75(H)  Sodium 135 - 145 mmol/L 138 139 140  Potassium 3.5 - 5.1 mmol/L 4.2 4.4 4.5  Chloride 98 - 111 mmol/L 100 99 96(L)  CO2 22 - 32 mmol/L 27 29 31   Calcium 8.9 - 10.3 mg/dL 8.9 9.2 9.4   Outpatient Rx:  [facility]  MWF [phone] 3 hours BF 400 /DF 500 2K/2.5 ca EDW - 86.5 kg; departure weight was 83.1 kg on 8/10 Meds: heparin 2000 units bolus; mircera 150 mcg last given 6/24 (had at [facility] in interim); venofer 100 mcg every tx; no hectoral or calcitriol    Assessment/Plan:   # End-stage renal disease on hemodialysis. - Usual schedule is Monday Wednesday Friday - Back to MWF schedule - next tx tomorrow at his outpatient unit - Will need PD catheter out in [place] with his established surgeon as an outpatient - note skelaxin contraindicated in ESRD - would not continue   # UTI  - abx per primary team    # Urinary retention - on flomax -  s/p in/out cath and in setting of UTI   # Hypertension - controlled.  Note history of hypertension and patient is on midodrine before dialysis per home regimen   # Anemia CKD - no acute indication for PRBC's - aranesp 100 mcg given on 0/99   # Metabolic bone disease - no activated vit D at outpatient unit (but was on at home - perhaps as continuation from when on PD); calcitriol 0.5 mcg three times a week for now.  Would discontinue calcitriol at home and give at the HD unit instead.  Note auryxia stopped last admit as phos low   Disposition per primary team.  Stable from a renal standpoint  Claudia Desanctis, MD 06/18/2021 10:21 AM

## 2021-06-20 ENCOUNTER — Encounter (HOSPITAL_COMMUNITY): Payer: Managed Care, Other (non HMO)

## 2021-06-20 LAB — CULTURE, BLOOD (ROUTINE X 2)
Culture: NO GROWTH
Culture: NO GROWTH
Special Requests: ADEQUATE

## 2021-06-20 NOTE — Discharge Summary (Signed)
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  Follow-up with urology as recommended.   Follow-up Information     [name], DO. Schedule an appointment as soon as possible for a visit in 2 week(s).   Specialty: Family Medicine Contact information: [address] [place] [address] [phone]         Raynelle Bring, MD. Schedule an appointment as soon as possible for a visit in 2 week(s).   Specialty: Urology Contact information: [place] [address] [phone]         [facility] Follow up.   Why: the office will call to schedule home health visits Contact information: [address],  [place] [address] [phone]               Discharge Instructions     Diet - low sodium heart healthy   Complete by: As directed    Increase activity slowly   Complete by: As directed    No wound care   Complete by: As directed        Diet recommendation: Cardiac diet  Activity: The patient is advised to gradually reintroduce usual activities, as tolerated  Discharge Condition: stable  Code Status: Full code   History of present  illness: As per the H and P dictated on admission, "Is a 65 year old male with past medical history of gastroesophageal reflux disease, mellitus type II, end-stage renal disease (HD MWF), coronary artery disease (S/P CABG in 2021 and NSTEMI in 2022), peripheral artery disease and hypertension who presented to [facility] emergency department with complaints of malaise, chills and purulent urine.  Patient was hospitalized at [facility] from 7/3 until 7/14 with concerns for nonhealing left foot ulcer and ongoing diabetic foot infection.  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  Patient gradually improved and arranges were made for the patient to be discharged to inpatient rehab on 7/14.  Over the course of the patient's hospitalization and rehab, patient developed intermittent bouts of urinary retention requiring intermittent catheterizations.  Patient was eventually discharged from inpatient rehabilitation on 8/4.   Patient explains that for approximate the past 4 to 5 days the patient has noted that his urine has intermittently become extremely "milky" occasionally mixed with blood.  Then, 2 days ago on 8/9 patient began to develop a sense of generalized malaise and weakness.  This was associated with frequent bouts of intense shivering and "cold sweats."  States that he has frequently had to change his clothes and sheets due to the degree of sweating.  Patient denies any actual associated fevers.  Patient is also complaining of associated nausea with one episode of  vomiting over the span of time.  Patient denies any associated abdominal pain or diarrhea.  Patient denies sick contacts, recent travel or confirmed contact with COVID-19 infection.   Patient's symptoms of generalized weakness malaise and chills with frequent bouts of diaphoresis prompted the patient to eventually present to [facility] emergency department for evaluation.   Upon evaluation in  the emergency department urinalysis was performed and felt to be suggestive of a urinary tract infection.  Patient was initiated on intravenous ceftriaxone for suspected complicated urinary tract infection.  The hospitalist group was then called to assess the patient for admission to the hospital."  Hospital Course:  Summary of his active problems in the hospital is as following. 1.  Complicated UTI Reports purulent bloody discharge.  Currently no urination. Bladder scan shows less than 100 mL. Once a day, self catheterizations. Will require follow-up with urology, patient has seen Dr. Alinda Money in the past. Currently on IV ceftriaxone.  Urine culture growing Klebsiella pneumoniae.  Blood culture currently negative for 48 hours. Discharging on oral Keflex.   2.  ESRD on HD MWF History of PD, has PD catheter Anemia chronic kidney disease Nephrology consulted.  Appreciate assistance. Continue HD MWF, currently no indication for urgent HD. Patient will follow-up with primary surgeon in [place] who has performed PD catheter placement.   3.  CAD SP CABG HLD On aspirin and Plavix. Lipitor chronically on hold.   4.  Type 2 diabetes mellitus, controlled with renal complication without long-term insulin use Resume home regimen on discharge.   5.  GERD Continue PPI   6.  Stage I sacral ulcer, POA Continue foam dressing.   7.  Bilateral trace pleural effusion. Continue HD and volume removal. Read mentions of possible pneumonia although patient clinically does not have any evidence of pneumonia and exam appears normal and currently on room air. Already on antibiotic.   8.  Chronic anxiety. Patient is on Klonopin 0.5 mg twice daily as needed.  Body mass index is 25.4 kg/m.    Nutrition Interventions:    Pressure Injury 06/16/21 Sacrum Mid Stage 1 -  Intact skin with non-blanchable redness of a localized area usually over a bony prominence. (Active)  06/16/21 1310  Location:  Sacrum  Location Orientation: Mid  Staging: Stage 1 -  Intact skin with non-blanchable redness of a localized area usually over a bony prominence.  Wound Description (Comments):   Present on Admission: Yes    On the day of the discharge the patient's vitals were stable, and no other new acute medical condition were reported. The patient was felt safe to be discharge at Home with Home health.  Consultants: Nephrology  Procedures: HD  DISCHARGE MEDICATION: Allergies as of 06/18/2021       Reactions   Ambien [zolpidem] Other (See Comments)   It causes pt to have insomnia, NOT sleep- idiosyncratic reaction        Medication List     TAKE these medications    acetaminophen 325 MG tablet Commonly known as: TYLENOL Take 1-2 tablets (325-650 mg total) by mouth every 4 (four) hours as needed for mild pain.   Aspirin Low Dose 81 MG EC tablet Generic drug: aspirin Take 1 tablet (81 mg total) by mouth daily.   Bayer Contour Monitor w/Device Kit 1 each by Does not apply route 4 (four) times daily. Test 4 x daily   bethanechol 25 MG tablet Commonly known as: URECHOLINE Take 1 tablet (25 mg total) by mouth  3 (three) times daily.   blood glucose meter kit and supplies Kit Dispense based on patient and insurance preference. Use up to four times daily as directed. (FOR ICD-9 250.00, 250.01).   calcitRIOL 0.25 MCG capsule Commonly known as: ROCALTROL Take 1 capsule (0.25 mcg total) by mouth 2 (two) times daily.   cephALEXin 500 MG capsule Commonly known as: KEFLEX Take 1 capsule (500 mg total) by mouth 2 (two) times daily for 5 days.   cinacalcet 30 MG tablet Commonly known as: SENSIPAR Take 1 tablet (30 mg total) by mouth once a week. Take on Mondays   clonazePAM 0.25 MG disintegrating tablet Commonly known as: KLONOPIN Take 1 tablet (0.25 mg total) by mouth 2 (two) times daily as needed (Anxiety).   clopidogrel 75 MG tablet Commonly known as: PLAVIX Take 1 tablet (75 mg  total) by mouth daily.   furosemide 80 MG tablet Commonly known as: LASIX Take 1 tablet (80 mg total) by mouth 2 (two) times daily.   glucose blood test strip Commonly known as: Estate manager/land agent Use as instructed 4 x daily   Insulin Pen Needle 31G X 8 MM Misc Commonly known as: B-D ULTRAFINE III SHORT PEN 1 each by Does not apply route as directed.   Lancets Misc 1 each by Does not apply route 4 (four) times daily.   metaxalone 400 MG tablet Commonly known as: SKELAXIN Take 1 tablet (400 mg total) by mouth 3 (three) times daily as needed for muscle spasms (please offer- pt forgets to ask).   midodrine 10 MG tablet Commonly known as: PROAMATINE Take 0.5 tablets (5 mg total) by mouth every Monday, Wednesday, and Friday with hemodialysis.   multivitamin Tabs tablet Take 1 tablet by mouth at bedtime.   nutrition supplement (JUVEN) Pack Take 1 packet by mouth 2 (two) times daily between meals for 10 days.   oxyCODONE-acetaminophen 5-325 MG tablet Commonly known as: PERCOCET/ROXICET Take 1 tablet by mouth every 8 (eight) hours as needed for severe pain.   pantoprazole 40 MG tablet Commonly known as: PROTONIX Take 1 tablet (40 mg total) by mouth daily.   pentoxifylline 400 MG CR tablet Commonly known as: TRENTAL Take 1 tablet (400 mg total) by mouth 3 (three) times daily with meals.   polyethylene glycol 17 g packet Commonly known as: MIRALAX / GLYCOLAX Take 17 g by mouth daily as needed for mild constipation.   senna-docusate 8.6-50 MG tablet Commonly known as: Senokot-S Take 2 tablets by mouth 2 (two) times daily.   traMADol 50 MG tablet Commonly known as: ULTRAM Take 1 tablet (50 mg total) by mouth every 6 (six) hours as needed for moderate pain.   zinc sulfate 220 (50 Zn) MG capsule Take 1 capsule (220 mg total) by mouth daily.       ASK your doctor about these medications    atorvastatin 80 MG tablet Commonly known as: LIPITOR Take 1 tablet (80 mg  total) by mouth daily.   Basaglar KwikPen 100 UNIT/ML Inject 5 Units into the skin at bedtime.   gabapentin 300 MG capsule Commonly known as: NEURONTIN Take 1 capsule (300 mg total) by mouth at bedtime.   gentamicin cream 0.1 % Commonly known as: GARAMYCIN Apply topically daily.   linaclotide 290 MCG Caps capsule Commonly known as: LINZESS Take 1 capsule (290 mcg total) by mouth daily before breakfast.   QUEtiapine 50 MG tablet Commonly known as: SEROQUEL Take 1 tablet (50 mg total) by mouth at bedtime.  tamsulosin 0.4 MG Caps capsule Commonly known as: FLOMAX Take 2 capsules (0.8 mg total) by mouth at bedtime.   venlafaxine XR 37.5 MG 24 hr capsule Commonly known as: EFFEXOR-XR Take 37.5 mg by mouth daily.        Discharge Exam: Filed Weights   06/15/21 1410 06/17/21 0814 06/17/21 1130  Weight: 85 kg 84.5 kg 82.6 kg   Vitals:   06/17/21 2358 06/18/21 0546  BP: 121/75 105/74  Pulse: 99 88  Resp: 18 18  Temp: 97.6 F (36.4 C) 98.3 F (36.8 C)  SpO2: 91% 95%   General: Appear in mild distress, no Rash; Oral Mucosa Clear, moist. no Abnormal Neck Mass Or lumps, Conjunctiva normal  Cardiovascular: S1 and S2 Present, no Murmur, Respiratory: good respiratory effort, Bilateral Air entry present and CTA, no Crackles, no wheezes Abdomen: Bowel Sound present, Soft and no tenderness Extremities: no Pedal edema Neurology: alert and oriented to time, place, and person affect appropriate. no new focal deficit Gait not checked due to patient safety concerns    The results of significant diagnostics from this hospitalization (including imaging, microbiology, ancillary and laboratory) are listed below for reference.    Significant Diagnostic Studies: DG Chest Port 1 View  Result Date: 06/15/2021 CLINICAL DATA:  Weakness EXAM: PORTABLE CHEST 1 VIEW COMPARISON:  05/20/2021 FINDINGS: Previous median sternotomy. Chronic cardiomegaly. Enlarging bilateral effusions with lower  lobe atelectasis and or pneumonia. Upper lungs remain largely clear. IMPRESSION: Enlarging effusions and worsening lower lobe atelectasis and or pneumonia. Electronically Signed   By: Nelson Chimes M.D.   On: 06/15/2021 20:16    Microbiology: Recent Results (from the past 240 hour(s))  Urine Culture     Status: Abnormal   Collection Time: 06/15/21  7:45 PM   Specimen: Urine, Clean Catch  Result Value Ref Range Status   Specimen Description URINE, CLEAN CATCH  Final   Special Requests   Final    NONE Performed at [facility], [address] [place], [place] [address]    Culture >=100,000 COLONIES/mL KLEBSIELLA PNEUMONIAE (A)  Final   Report Status 06/18/2021 FINAL  Final   Organism ID, Bacteria KLEBSIELLA PNEUMONIAE (A)  Final      Susceptibility   Klebsiella pneumoniae - MIC*    AMPICILLIN >=32 RESISTANT Resistant     CEFAZOLIN <=4 SENSITIVE Sensitive     CEFEPIME <=0.12 SENSITIVE Sensitive     CEFTRIAXONE <=0.25 SENSITIVE Sensitive     CIPROFLOXACIN <=0.25 SENSITIVE Sensitive     GENTAMICIN <=1 SENSITIVE Sensitive     IMIPENEM <=0.25 SENSITIVE Sensitive     NITROFURANTOIN 32 SENSITIVE Sensitive     TRIMETH/SULFA <=20 SENSITIVE Sensitive     AMPICILLIN/SULBACTAM 8 SENSITIVE Sensitive     PIP/TAZO <=4 SENSITIVE Sensitive     * >=100,000 COLONIES/mL KLEBSIELLA PNEUMONIAE  Blood culture (routine x 2)     Status: None   Collection Time: 06/15/21  8:00 PM   Specimen: BLOOD  Result Value Ref Range Status   Specimen Description BLOOD RIGHT HAND  Final   Special Requests   Final    BOTTLES DRAWN AEROBIC AND ANAEROBIC Blood Culture results may not be optimal due to an inadequate volume of blood received in culture bottles   Culture   Final    NO GROWTH 5 DAYS Performed at [facility], [place] [address] [place], [place] [address]    Report Status 06/20/2021 FINAL  Final  Blood culture (routine x 2)  Status: None   Collection Time: 06/15/21  8:20 PM   Specimen: BLOOD   Result Value Ref Range Status   Specimen Description BLOOD RIGHT FOREARM  Final   Special Requests   Final    BOTTLES DRAWN AEROBIC AND ANAEROBIC Blood Culture adequate volume   Culture   Final    NO GROWTH 5 DAYS Performed at [facility], [address] [place], [place] [address]    Report Status 06/20/2021 FINAL  Final  Resp Panel by RT-PCR (Flu A&B, Covid) Nasopharyngeal Swab     Status: None   Collection Time: 06/15/21 10:39 PM   Specimen: Nasopharyngeal Swab; Nasopharyngeal(NP) swabs in vial transport medium  Result Value Ref Range Status   SARS Coronavirus 2 by RT PCR NEGATIVE NEGATIVE Final    Comment: (NOTE) SARS-CoV-2 target nucleic acids are NOT DETECTED.  The SARS-CoV-2 RNA is generally detectable in upper respiratory specimens during the acute phase of infection. The lowest concentration of SARS-CoV-2 viral copies this assay can detect is 138 copies/mL. A negative result does not preclude SARS-Cov-2 infection and should not be used as the sole basis for treatment or other patient management decisions. A negative result may occur with  improper specimen collection/handling, submission of specimen other than nasopharyngeal swab, presence of viral mutation(s) within the areas targeted by this assay, and inadequate number of viral copies(<138 copies/mL). A negative result must be combined with clinical observations, patient history, and epidemiological information. The expected result is Negative.  Fact Sheet for Patients:  EntrepreneurPulse.com.au  Fact Sheet for Healthcare Providers:  IncredibleEmployment.be  This test is no t yet approved or cleared by the [place] FDA and  has been authorized for detection and/or diagnosis of SARS-CoV-2 by FDA under an Emergency Use Authorization (EUA). This EUA will remain  in effect (meaning this test can be used) for the duration of the COVID-19 declaration under Section  564(b)(1) of the Act, 21 U.S.C.section 360bbb-3(b)(1), unless the authorization is terminated  or revoked sooner.       Influenza A by PCR NEGATIVE NEGATIVE Final   Influenza B by PCR NEGATIVE NEGATIVE Final    Comment: (NOTE) The Xpert Xpress SARS-CoV-2/FLU/RSV plus assay is intended as an aid in the diagnosis of influenza from Nasopharyngeal swab specimens and should not be used as a sole basis for treatment. Nasal washings and aspirates are unacceptable for Xpert Xpress SARS-CoV-2/FLU/RSV testing.  Fact Sheet for Patients: EntrepreneurPulse.com.au  Fact Sheet for Healthcare Providers: IncredibleEmployment.be  This test is not yet approved or cleared by the [place] FDA and has been authorized for detection and/or diagnosis of SARS-CoV-2 by FDA under an Emergency Use Authorization (EUA). This EUA will remain in effect (meaning this test can be used) for the duration of the COVID-19 declaration under Section 564(b)(1) of the Act, 21 U.S.C. section 360bbb-3(b)(1), unless the authorization is terminated or revoked.  Performed at [facility], [place] [address] [place], [place] [address]   Urine Culture     Status: Abnormal   Collection Time: 06/16/21  6:40 AM   Specimen: Urine, Catheterized  Result Value Ref Range Status   Specimen Description URINE, CATHETERIZED  Final   Special Requests   Final    NONE Performed at [facility], [place] [address], [place], [place] [address]    Culture >=100,000 COLONIES/mL KLEBSIELLA PNEUMONIAE (A)  Final   Report Status 06/18/2021 FINAL  Final   Organism ID, Bacteria KLEBSIELLA PNEUMONIAE (A)  Final  Susceptibility   Klebsiella pneumoniae - MIC*    AMPICILLIN >=32 RESISTANT Resistant     CEFAZOLIN <=4 SENSITIVE Sensitive     CEFEPIME <=0.12 SENSITIVE Sensitive     CEFTRIAXONE <=0.25 SENSITIVE Sensitive     CIPROFLOXACIN <=0.25 SENSITIVE Sensitive     GENTAMICIN <=1 SENSITIVE  Sensitive     IMIPENEM <=0.25 SENSITIVE Sensitive     NITROFURANTOIN <=16 SENSITIVE Sensitive     TRIMETH/SULFA <=20 SENSITIVE Sensitive     AMPICILLIN/SULBACTAM 8 SENSITIVE Sensitive     PIP/TAZO <=4 SENSITIVE Sensitive     * >=100,000 COLONIES/mL KLEBSIELLA PNEUMONIAE     Labs: CBC: Recent Labs  Lab 06/15/21 1542 06/15/21 2000 06/16/21 0505 06/17/21 0618  WBC 7.3 7.5 6.8 7.9  NEUTROABS 5.0  --  4.4  --   HGB 10.4* 10.5* 9.3* 9.5*  HCT 35.2* 35.5* 31.6* 31.3*  MCV 109.7* 111.3* 109.7* 107.2*  PLT 174 172 151 924   Basic Metabolic Panel: Recent Labs  Lab 06/15/21 1542 06/16/21 0505 06/17/21 0618  NA 140 139 138  K 4.5 4.4 4.2  CL 96* 99 100  CO2 '31 29 27  ' GLUCOSE 133* 138* 132*  BUN 26* 33* 49*  CREATININE 4.75* 5.37* 6.31*  CALCIUM 9.4 9.2 8.9  MG  --  2.1  --   PHOS  --   --  6.1*   Liver Function Tests: Recent Labs  Lab 06/15/21 1542 06/16/21 0505 06/17/21 0618  AST 32 21  --   ALT 19 18  --   ALKPHOS 116 107  --   BILITOT 0.8 0.8  --   PROT 6.8 5.7*  --   ALBUMIN 3.0* 2.5* 2.4*   CBG: Recent Labs  Lab 06/17/21 1151 06/17/21 1558 06/17/21 2144 06/18/21 0614 06/18/21 1126  GLUCAP 84 190* 140* 127* 145*    Time spent: 35 minutes  Signed:  Berle Mull  [facility] 06/18/2021

## 2021-06-27 ENCOUNTER — Encounter: Payer: Self-pay | Admitting: Orthopedic Surgery

## 2021-06-27 ENCOUNTER — Ambulatory Visit (INDEPENDENT_AMBULATORY_CARE_PROVIDER_SITE_OTHER): Payer: Managed Care, Other (non HMO) | Admitting: Orthopedic Surgery

## 2021-06-27 ENCOUNTER — Other Ambulatory Visit: Payer: Self-pay

## 2021-06-27 DIAGNOSIS — Z89512 Acquired absence of left leg below knee: Secondary | ICD-10-CM

## 2021-06-27 DIAGNOSIS — S88112A Complete traumatic amputation at level between knee and ankle, left lower leg, initial encounter: Secondary | ICD-10-CM

## 2021-06-27 MED ORDER — DOXYCYCLINE HYCLATE 100 MG PO TABS: 100.0000 mg | ORAL_TABLET | Freq: Two times a day (BID) | ORAL | 0 refills | Status: DC

## 2021-06-27 NOTE — Progress Notes (Signed)
Office Visit Note   Patient: Maxwell Harmon           Date of Birth: 1956/02/05           MRN: [id number] Visit Date: 06/27/2021              Requested by: [name], [place] [address] PCP: [name], DO  Chief Complaint  Patient presents with   Left Leg - Routine Post Op    05/12/21 left BKA       HPI:  Patient is a 65 year old gentleman who is 6 weeks status post left transtibial amputation he has had some mild ischemic changes along the suture line he has developed some cellulitis.  He is wearing a 3 extra-large shrinker. Assessment & Plan: Visit Diagnoses:  1. Below-knee amputation of left lower extremity ([place])     Plan: Recommended following up with [employer] to get a 2 XL shrinker.  He was placed in a short 3 XL shrinker today.  He is called in a prescription for doxycycline.  1 staple was removed.  Direct and good probiotics zinc vitamin C and protein supplement.  Follow-Up Instructions: Return in about 1 week (around 07/04/2021).   Ortho Exam  Patient is alert, oriented, no adenopathy, well-dressed, normal affect, normal respiratory effort. Examination patient has cellulitis with tender to palpation there is mild ischemic changes along the surgical incision which is resolving.  Imaging: No results found.   Labs: Lab Results  Component Value Date   HGBA1C 5.3 06/16/2021   HGBA1C 8.2 (H) 04/10/2021   HGBA1C 6.9 (H) 01/02/2019   REPTSTATUS 06/18/2021 FINAL 06/16/2021   CULT >=100,000 COLONIES/mL KLEBSIELLA PNEUMONIAE (A) 06/16/2021   LABORGA KLEBSIELLA PNEUMONIAE (A) 06/16/2021     Lab Results  Component Value Date   ALBUMIN 2.4 (L) 06/17/2021   ALBUMIN 2.5 (L) 06/16/2021   ALBUMIN 3.0 (L) 06/15/2021    Lab Results  Component Value Date   MG 2.1 06/16/2021   MG 2.0 05/14/2021   MG 2.1 04/12/2021   No results found for: VD25OH  No results found for: PREALBUMIN CBC EXTENDED Latest Ref Rng & Units 06/17/2021 06/16/2021  06/15/2021  WBC 4.0 - 10.5 K/uL 7.9 6.8 7.5  RBC 4.22 - 5.81 MIL/uL 2.92(L) 2.88(L) 3.19(L)  HGB 13.0 - 17.0 g/dL 9.5(L) 9.3(L) 10.5(L)  HCT 39.0 - 52.0 % 31.3(L) 31.6(L) 35.5(L)  PLT 150 - 400 K/uL 160 151 172  NEUTROABS 1.7 - 7.7 K/uL - 4.4 -  LYMPHSABS 0.7 - 4.0 K/uL - 1.6 -     There is no height or weight on file to calculate BMI.  Orders:  No orders of the defined types were placed in this encounter.  Meds ordered this encounter  Medications   doxycycline (VIBRA-TABS) 100 MG tablet    Sig: Take 1 tablet (100 mg total) by mouth 2 (two) times daily.    Dispense:  60 tablet    Refill:  0     Procedures: No procedures performed  Clinical Data: No additional findings.  ROS:  All other systems negative, except as noted in the HPI. Review of Systems  Objective: Vital Signs: There were no vitals taken for this visit.  Specialty Comments:  No specialty comments available.  PMFS History: Patient Active Problem List   Diagnosis Date Noted   Urinary retention 06/16/2021   Coronary artery disease involving native heart without angina pectoris 06/16/2021   Mixed diabetic hyperlipidemia associated with type 2  diabetes mellitus ([place]) 06/16/2021   GERD without esophagitis 06/16/2021   UTI (urinary tract infection) 06/16/2021   Pressure injury of skin 06/16/2021   Insomnia 06/15/2021   Therapeutic opioid-induced constipation (OIC) 01/75/1025   Complicated UTI (urinary tract infection) 06/15/2021   Below-knee amputation of left lower extremity ([place]) 05/18/2021   Acute on chronic systolic (congestive) heart failure (HCC)    Ischemic ulcer of left heel ([place])    Cellulitis 05/07/2021   Ischemic ulcer diabetic foot ([place]) 05/07/2021   Critical lower limb ischemia ([place]) 04/20/2021   Sepsis ([place]) 04/20/2021   NSTEMI (non-ST elevated myocardial infarction) ([place]) 04/20/2021   Pressure ulcer, stage 2 ([place]) 04/11/2021   Acute heart failure ([place]) 04/10/2021   Uremia 04/10/2021    Gangrene of left foot (HCC)    PAD (peripheral artery disease) ([place])    End-stage renal disease on hemodialysis (HCC)    Dyspnea on exertion    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 ([place]) 09/02/2015   Hyperlipidemia 09/02/2015   Essential hypertension, benign 09/02/2015   Obesity due to excess calories 09/02/2015   Past Medical History:  Diagnosis Date   Anemia of chronic disease    CAD (coronary artery disease)    Diabetes mellitus without complication (HCC)    ESRD (end stage renal disease) on dialysis ([place])    MWF in [place]   History of bleeding ulcers    History of TIAs    Hypertension    NSTEMI (non-ST elevated myocardial infarction) ([place]) 04/2021   PAD (peripheral artery disease) ([place])     Family History  Problem Relation Age of Onset   Stroke Mother    Stroke Father    Cancer Father    Cancer Brother     Past Surgical History:  Procedure Laterality Date   ABDOMINAL AORTOGRAM W/LOWER EXTREMITY Bilateral 04/14/2021   Procedure: ABDOMINAL AORTOGRAM W/LOWER EXTREMITY;  Surgeon: Elam Dutch, MD;  Location: [facility];  Service: Vascular;  Laterality: Bilateral;   AMPUTATION Left 05/12/2021   Procedure: LEFT BELOW KNEE AMPUTATION;  Surgeon: Newt Minion, MD;  Location: [facility];  Service: Orthopedics;  Laterality: Left;   CATARACT EXTRACTION W/PHACO Left 02/15/2014   Procedure: CATARACT EXTRACTION PHACO AND INTRAOCULAR LENS PLACEMENT (IOC);  Surgeon: Tonny Branch, MD;  Location[facility];  Service: Ophthalmology;  Laterality: Left;  CDE 10.84   CATARACT EXTRACTION W/PHACO Right 12/14/2013   Procedure: CATARACT EXTRACTION PHACO AND INTRAOCULAR LENS PLACEMENT (IOC);  Surgeon: Tonny Branch, MD;  Location: [facility];  Service: Ophthalmology;  Laterality: Right;  CDE:  16.30   CHOLECYSTECTOMY  02/2013   EYE SURGERY     IR FLUORO GUIDE CV LINE RIGHT  01/05/2019   IR US GUIDE VASC ACCESS RIGHT   01/05/2019   LAPAROSCOPIC NEPHRECTOMY Right 01/08/2019   Procedure: LAPAROSCOPIC RADICAL NEPHRECTOMY;  Surgeon: Raynelle Bring, MD;  [facility];  Service: Urology;  Laterality: Right;   LEFT HEART CATH AND CORS/GRAFTS ANGIOGRAPHY N/A 04/14/2021   Procedure: LEFT HEART CATH AND CORS/GRAFTS ANGIOGRAPHY;  Surgeon: Jolaine Artist, MD;  Location: [facility];  Service: Cardiovascular;  Laterality: N/A;   PENILE PROSTHESIS IMPLANT     PERIPHERAL VASCULAR INTERVENTION Left 04/19/2021   Procedure: PERIPHERAL VASCULAR INTERVENTION;  Surgeon: Cherre Robins, MD;  Location: [facility];  Service: Cardiovascular;  Laterality: Left;   TOE AMPUTATION Right 2013   [facility]   Social History   Occupational History   Not on file  Tobacco Use   Smoking status: Never   Smokeless tobacco: Never  Vaping Use   Vaping Use: Never used  Substance and Sexual Activity   Alcohol use: Yes    Comment: social drink    Drug use: No   Sexual activity: Not on file

## 2021-07-02 ENCOUNTER — Other Ambulatory Visit: Payer: Self-pay | Admitting: Physical Medicine and Rehabilitation

## 2021-07-03 NOTE — Telephone Encounter (Signed)
Patient is scheduled with Danella Sensing, ANP 07/13/2021. Message forwarded to assigned MD, Courtney Heys, MD for review and refill if appropriate.

## 2021-07-04 ENCOUNTER — Telehealth (HOSPITAL_COMMUNITY): Payer: Self-pay | Admitting: Physical Medicine & Rehabilitation

## 2021-07-04 NOTE — Telephone Encounter (Signed)
Maxwell Harmon needs rx, medical justification for urinary catheters:  Due to Maxwell Harmon urinary retention and anatomy, he is unable to pass a straight tip catheter. It is medically necessary for him to perform cic with coude' tip catheter 1/day indefinitely to fully empty his bladder and keep him from further infection.    Meredith Staggers, MD, [facility] 07/04/2021

## 2021-07-06 ENCOUNTER — Encounter (HOSPITAL_COMMUNITY): Payer: Self-pay | Admitting: Orthopedic Surgery

## 2021-07-06 ENCOUNTER — Other Ambulatory Visit: Payer: Self-pay

## 2021-07-06 ENCOUNTER — Other Ambulatory Visit: Payer: Self-pay | Admitting: Physician Assistant

## 2021-07-06 ENCOUNTER — Encounter: Payer: Self-pay | Admitting: Orthopedic Surgery

## 2021-07-06 ENCOUNTER — Ambulatory Visit (INDEPENDENT_AMBULATORY_CARE_PROVIDER_SITE_OTHER): Payer: Managed Care, Other (non HMO) | Admitting: Orthopedic Surgery

## 2021-07-06 DIAGNOSIS — S88112A Complete traumatic amputation at level between knee and ankle, left lower leg, initial encounter: Secondary | ICD-10-CM

## 2021-07-06 DIAGNOSIS — I739 Peripheral vascular disease, unspecified: Secondary | ICD-10-CM

## 2021-07-06 DIAGNOSIS — Z89512 Acquired absence of left leg below knee: Secondary | ICD-10-CM

## 2021-07-06 DIAGNOSIS — T8781 Dehiscence of amputation stump: Secondary | ICD-10-CM

## 2021-07-06 NOTE — Progress Notes (Signed)
Office Visit Note   Patient: Maxwell Harmon           Date of Birth: 1956/08/26           MRN: [id number] Visit Date: 07/06/2021              Requested by: [name], [place] [address] PCP: [name], DO  Chief Complaint  Patient presents with   Left Leg - Routine Post Op    05/12/21 left BKA       HPI: Patient is a 65 year old gentleman who is status post a left transtibial amputation with peripheral vascular disease venous insufficiency and type 2 diabetes with history of tobacco use.  Patient has had progressive wound dehiscence with black eschar and progressive cellulitis despite oral antibiotics.  Assessment & Plan: Visit Diagnoses:  1. Below-knee amputation of left lower extremity ([place])   2. PVD (peripheral vascular disease) ([place])   3. Dehiscence of amputation stump (HCC)     Plan: Recommended patient proceed with a revision of the below the knee amputation.  Discussed that we will try to save his knee and patient should be able to ambulate well with a prosthesis with revision amputation.  The surrounding soft tissue cellulitis and necrosis is significant enough that wound care will not be sufficient.  Plan for admission with IV antibiotics and anticipate discharge in 3 days.  Follow-Up Instructions: Return in about 2 weeks (around 07/20/2021).   Ortho Exam  Patient is alert, oriented, no adenopathy, well-dressed, normal affect, normal respiratory effort. Examination patient has progressive wound dehiscence with cellulitis and a large black eschar over the left transtibial amputation.  The area without eschar has fibrinous tissue.  There is no purulent drainage no exposed bone.  Imaging: No results found. No images are attached to the encounter.  Labs: Lab Results  Component Value Date   HGBA1C 5.3 06/16/2021   HGBA1C 8.2 (H) 04/10/2021   HGBA1C 6.9 (H) 01/02/2019   REPTSTATUS 06/18/2021 FINAL 06/16/2021   CULT >=100,000  COLONIES/mL KLEBSIELLA PNEUMONIAE (A) 06/16/2021   LABORGA KLEBSIELLA PNEUMONIAE (A) 06/16/2021     Lab Results  Component Value Date   ALBUMIN 2.4 (L) 06/17/2021   ALBUMIN 2.5 (L) 06/16/2021   ALBUMIN 3.0 (L) 06/15/2021    Lab Results  Component Value Date   MG 2.1 06/16/2021   MG 2.0 05/14/2021   MG 2.1 04/12/2021   No results found for: VD25OH  No results found for: PREALBUMIN CBC EXTENDED Latest Ref Rng & Units 06/17/2021 06/16/2021 06/15/2021  WBC 4.0 - 10.5 K/uL 7.9 6.8 7.5  RBC 4.22 - 5.81 MIL/uL 2.92(L) 2.88(L) 3.19(L)  HGB 13.0 - 17.0 g/dL 9.5(L) 9.3(L) 10.5(L)  HCT 39.0 - 52.0 % 31.3(L) 31.6(L) 35.5(L)  PLT 150 - 400 K/uL 160 151 172  NEUTROABS 1.7 - 7.7 K/uL - 4.4 -  LYMPHSABS 0.7 - 4.0 K/uL - 1.6 -     There is no height or weight on file to calculate BMI.  Orders:  No orders of the defined types were placed in this encounter.  No orders of the defined types were placed in this encounter.    Procedures: No procedures performed  Clinical Data: No additional findings.  ROS:  All other systems negative, except as noted in the HPI. Review of Systems  Objective: Vital Signs: There were no vitals taken for this visit.  Specialty Comments:  No specialty comments available.  PMFS History: Patient Active Problem List  Diagnosis Date Noted   Urinary retention 06/16/2021   Coronary artery disease involving native heart without angina pectoris 06/16/2021   Mixed diabetic hyperlipidemia associated with type 2 diabetes mellitus ([place]) 06/16/2021   GERD without esophagitis 06/16/2021   UTI (urinary tract infection) 06/16/2021   Pressure injury of skin 06/16/2021   Insomnia 06/15/2021   Therapeutic opioid-induced constipation (OIC) 99/24/2683   Complicated UTI (urinary tract infection) 06/15/2021   Below-knee amputation of left lower extremity ([place]) 05/18/2021   Acute on chronic systolic (congestive) heart failure (HCC)    Ischemic ulcer of left heel  ([place])    Cellulitis 05/07/2021   Ischemic ulcer diabetic foot ([place]) 05/07/2021   Critical lower limb ischemia ([place]) 04/20/2021   Sepsis ([place]) 04/20/2021   NSTEMI (non-ST elevated myocardial infarction) ([place]) 04/20/2021   Pressure ulcer, stage 2 ([place]) 04/11/2021   Acute heart failure ([place]) 04/10/2021   Uremia 04/10/2021   Gangrene of left foot (HCC)    PAD (peripheral artery disease) ([place])    End-stage renal disease on hemodialysis (HCC)    Dyspnea on exertion    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 ([place]) 09/02/2015   Hyperlipidemia 09/02/2015   Essential hypertension, benign 09/02/2015   Obesity due to excess calories 09/02/2015   Past Medical History:  Diagnosis Date   Anemia of chronic disease    CAD (coronary artery disease)    Diabetes mellitus without complication (HCC)    ESRD (end stage renal disease) on dialysis ([place])    MWF in [place]   History of bleeding ulcers    History of TIAs    Hypertension    NSTEMI (non-ST elevated myocardial infarction) ([place]) 04/2021   PAD (peripheral artery disease) ([place])     Family History  Problem Relation Age of Onset   Stroke Mother    Stroke Father    Cancer Father    Cancer Brother     Past Surgical History:  Procedure Laterality Date   ABDOMINAL AORTOGRAM W/LOWER EXTREMITY Bilateral 04/14/2021   Procedure: ABDOMINAL AORTOGRAM W/LOWER EXTREMITY;  Surgeon: Elam Dutch, MD;  Location: [facility];  Service: Vascular;  Laterality: Bilateral;   AMPUTATION Left 05/12/2021   Procedure: LEFT BELOW KNEE AMPUTATION;  Surgeon: Newt Minion, MD;  Location: [facility];  Service: Orthopedics;  Laterality: Left;   CATARACT EXTRACTION W/PHACO Left 02/15/2014   Procedure: CATARACT EXTRACTION PHACO AND INTRAOCULAR LENS PLACEMENT (IOC);  Surgeon: Tonny Branch, MD;  [facility];  Service: Ophthalmology;  Laterality: Left;  CDE 10.84   CATARACT  EXTRACTION W/PHACO Right 12/14/2013   Procedure: CATARACT EXTRACTION PHACO AND INTRAOCULAR LENS PLACEMENT (IOC);  Surgeon: Tonny Branch, MD;  [facility]: [facility];  Service: Ophthalmology;  Laterality: Right;  CDE:  16.30   CHOLECYSTECTOMY  02/2013   EYE SURGERY     IR FLUORO GUIDE CV LINE RIGHT  01/05/2019   IR US GUIDE VASC ACCESS RIGHT  01/05/2019   LAPAROSCOPIC NEPHRECTOMY Right 01/08/2019   Procedure: LAPAROSCOPIC RADICAL NEPHRECTOMY;  Surgeon: Raynelle Bring, MD;  [facility];  Service: Urology;  Laterality: Right;   LEFT HEART CATH AND CORS/GRAFTS ANGIOGRAPHY N/A 04/14/2021   Procedure: LEFT HEART CATH AND CORS/GRAFTS ANGIOGRAPHY;  Surgeon: Jolaine Artist, MD;  Location: [facility];  Service: Cardiovascular;  Laterality: N/A;   PENILE PROSTHESIS IMPLANT     PERIPHERAL VASCULAR INTERVENTION Left 04/19/2021   Procedure: PERIPHERAL VASCULAR INTERVENTION;  Surgeon: Cherre Robins, MD;  Location: [facility];  Service: Cardiovascular;  Laterality: Left;   TOE AMPUTATION Right 2013   [facility]   Social History   Occupational History   Not on file  Tobacco Use   Smoking status: Never   Smokeless tobacco: Never  Vaping Use   Vaping Use: Never used  Substance and Sexual Activity   Alcohol use: Yes    Comment: social drink    Drug use: No   Sexual activity: Not on file

## 2021-07-06 NOTE — Anesthesia Preprocedure Evaluation (Addendum)
Anesthesia Evaluation  Patient identified by MRN, date of birth, ID band Patient awake    Reviewed: Allergy & Precautions, NPO status , Patient's Chart, lab work & pertinent test results  History of Anesthesia Complications Negative for: history of anesthetic complications  Airway Mallampati: II  TM Distance: >3 FB Neck ROM: Full    Dental no notable dental hx.    Pulmonary neg pulmonary ROS,    Pulmonary exam normal        Cardiovascular hypertension, Pt. on medications + CAD, + Past MI (NSTEMI 04/2021), + Peripheral Vascular Disease and +CHF  Normal cardiovascular exam  TTE 05/15/21: EF 25-30%, severe hypokinesis of  the mid-to-apical inferior, mid-to-apical septal, mid-to-apical anterior, apical lateral LV segments, apical segments and apex appear akinetic, RV systolic function moderately reduced  Previous TTE 04/2021: EF 30-35%, mild to moderate MR, mild AS    Neuro/Psych TIA   GI/Hepatic Neg liver ROS, GERD  Medicated and Controlled,  Endo/Other  diabetes, Type 2, Insulin Dependent  Renal/GU Dialysis and ESRFRenal disease (HD M/W/F)  negative genitourinary   Musculoskeletal negative musculoskeletal ROS (+)   Abdominal   Peds  Hematology  (+) anemia ,   Anesthesia Other Findings Day of surgery medications reviewed with patient.  Reproductive/Obstetrics negative OB ROS                           Anesthesia Physical Anesthesia Plan  ASA: 4  Anesthesia Plan: General   Post-op Pain Management: GA combined w/ Regional for post-op pain   Induction: Intravenous  PONV Risk Score and Plan: 2 and Treatment may vary due to age or medical condition, Ondansetron, Dexamethasone and Midazolam  Airway Management Planned: LMA  Additional Equipment: None  Intra-op Plan:   Post-operative Plan: Extubation in OR  Informed Consent: I have reviewed the patients History and Physical, chart, labs  and discussed the procedure including the risks, benefits and alternatives for the proposed anesthesia with the patient or authorized representative who has indicated his/her understanding and acceptance.     Dental advisory given  Plan Discussed with: CRNA  Anesthesia Plan Comments: (PAT note written 07/06/2021 by Myra Gianotti, PA-C. )       Anesthesia Quick Evaluation

## 2021-07-06 NOTE — Progress Notes (Signed)
Anesthesia Chart Review: SAME DAY WORK-UP  Case: [id number] Date/Time: 07/07/21 1125   Procedure: REVISION LEFT BELOW KNEE AMPUTATION (Left)   Anesthesia type: Choice   Pre-op diagnosis: Dehiscence Left Below Knee Amputation   Location: [facility] OR ROOM 05 / [place] OR   Surgeons: Newt Minion, MD       DISCUSSION: Patient is a 65 year old male scheduled for the above procedure. 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  He had follow-up with Dr. Sharol Given on 07/06/2021 and noted to have progressive wound dehiscence with black eschar and progression of cellulitis despite oral antibiotics.  The above procedure recommended.  Perioperative Plavix instructions per surgeon.  History includes never smoker, HTN, DM2, CAD (CABG x3 06/02/20: LIMA-LAD, SVG-D1 with Y-SVG-OM1, Dr. 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   - Admitted to [facility] 0/53/97-6/73/41 for complicated UTI with history of urine retention, s/p I&O cath. Urine culture + Klebsiella pneumoniae, s/p antibiotic therapy.  - Admitted to [facility] 05/07/21-05/18/21 with cellulitis, diabetic left foot ulcer. 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 [name] discharge 06/08/21.  - Admitted to [facility] 04/10/21-04/20/21 for LLE critical limb ischemia, sepsis due to LLE cellulitis with diabetic food infection, NSTEMI. Cardiology consulted. Echo showed new LV dysfunction/acute systolic CHF with EF 93-79%, question of Takotsubo's vs. multivessel CAD/apical LAD. Cardiac cath done on 04/14/21 showed widely patent LIMA-LAD but LAD occluded immediately after LIMA plugs in and sequential vein graft torturous with mild narrowing  at insertion to OM1. No options for revascularization and medical therapy recommended. He had to be transitioned from PD to HD due to drainage issues/possible catheter malfunction.  He primarily has been cardiology during his admissions, but did not reports primary cardiologist. Last visits (in-patient) with HF cardiologist Glori Bickers, MD on 05/16/21.  Volume status improved on HD.  Requiring midodrine on HD days so no room for GDMT with soft BP.  Anesthesia team to evaluate on the day of surgery.  VS: Ht _0  (1.803 m)   Wt 88.5 kg   BMI 27.20 kg/m  BP Readings from Last 3 Encounters:  06/18/21 105/74  06/08/21 106/60  05/18/21 118/70   Pulse Readings from Last 3 Encounters:  06/18/21 88  06/08/21 93  05/18/21 88     PROVIDERS: [name], DO is PCP  Raynelle Bring, MD is urologist Loreli Slot, MD is nephrologist   LABS: Last lab results include: Lab Results  Component Value Date   WBC 7.9 06/17/2021   HGB 9.5 (L) 06/17/2021   HCT 31.3 (L) 06/17/2021   PLT 160 06/17/2021   GLUCOSE 132 (H) 06/17/2021   CHOL 122 04/10/2021   TRIG 81 04/10/2021   HDL 32 (L) 04/10/2021   LDLCALC 74 04/10/2021   ALT 18 06/16/2021   AST 21 06/16/2021   NA 138 06/17/2021   K 4.2 06/17/2021   CL 100 06/17/2021   CREATININE 6.31 (H) 06/17/2021   BUN 49 (H) 06/17/2021   CO2 27 06/17/2021   INR 1.3 (H) 05/07/2021   HGBA1C 5.3 06/16/2021     IMAGES: 1V PCXR 06/15/21 (during admission for complicated UTI, urinary retention): FINDINGS: Previous  median sternotomy. Chronic cardiomegaly. Enlarging bilateral effusions with lower lobe atelectasis and or pneumonia. Upper lungs remain largely clear. IMPRESSION: Enlarging effusions and worsening lower lobe atelectasis and or pneumonia.   EKG: 06/16/21:  normal sinus rhythm Anteroseptal infarct, old Abnormal T, consider ischemia, diffuse leads No significant change since last tracing Confirmed by [place], Peter [phone])  on 06/16/2021 4:07:11 PM   CV: Echo (Limited) 05/15/21: IMPRESSIONS   1. Left ventricular ejection fraction, by estimation, is 25 to 30%. Left  ventricular ejection fraction by 3D volume is 30 %. The left ventricle has  severely decreased function. The left ventricle demonstrates regional wall  motion abnormalities (see  scoring diagram/findings for description). There is severe hypokinesis of  the mid-to-apical inferior, mid-to-apical septal, mid-to-apical anterior,  apical lateral LV segments. The apical segments and apex appear akinetic.  There is no LV thrombus  visualized. The left ventricular internal cavity size was mildly dilated.   2. Right ventricular systolic function is moderately reduced. The right  ventricular size is normal.   3. The mitral valve is degenerative. Moderate mitral annular  calcification.   4. The inferior vena cava is dilated in size with <50% respiratory  variability, suggesting right atrial pressure of 15 mmHg.  - Comparison(s): Compared to prior TTE in 04/2021, there is no significant  change.    Cardiac cath 04/14/21 Haroldine Laws, Daniel, MD): Ost RCA to Prox RCA lesion is 100% stenosed. Prox Cx lesion is 95% stenosed. Prox LAD to Mid LAD lesion is 100% stenosed. Dist LAD lesion is 100% stenosed.   Findings: 1. LM ok 2. LAD 100% prox 3. LCX 95% prox 4. RCA 100% prox 5. LIMA to LAD widely patent but LAD is occluded immediately after LIMA plugs in 6. Sequential SVG to OM1 & OM2. Tortuous graft with mild narrowing at insertion to OM1 but otherwise OK. OMs are small 7. Elevated filling pressures with Ao 178/71 (111) LVEDP 36   Assessment: Severe 3v CAD with patent grafts but poor runoff into heavily diseased native circulation.    Plan/Discussion: Continue medical therapy. No other options for revascularization.     Echo 04/10/21: IMPRESSIONS   1. LV function appears preserved at the base and diminished moving toward  apex, akinetic at the  apex. This is across all segments. Differential  includes takotsubo's cardiomyopathy vs. multivessel CAD. LV thrombus  excluded by contrast. Left ventricular  ejection fraction, by estimation, is 30 to 35%. The left ventricle has  moderately decreased function. The left ventricle demonstrates regional  wall motion abnormalities (see scoring diagram/findings for description).  The left ventricular internal cavity  size was severely dilated. Left ventricular diastolic parameters are  indeterminate.   2. Right ventricular systolic function is moderately reduced. The right  ventricular size is moderately enlarged.   3. Left atrial size was moderately dilated.   4. Right atrial size was mildly dilated.   5. The mitral valve is degenerative. Mild to moderate mitral valve  regurgitation. No evidence of mitral stenosis.   6. The aortic valve is tricuspid. There is moderate calcification of the  aortic valve. There is moderate thickening of the aortic valve. Aortic  valve regurgitation is not visualized. Mild aortic valve stenosis.   7. The inferior vena cava is dilated in size with <50% respiratory  variability, suggesting right atrial pressure of 15 mmHg.  - Conclusion(s)/Recommendation(s): Severely reduced LVEF with worse wall  motion at the apex compared to the base. Concerning for takotsubo's vs.  multivessel CAD/apical LAD. Findings  communicated with Dr. Acie Fredrickson.  - Comparison(s): No prior Echocardiogram in [facility] [12/19/20 echo in [facility]: LVEF 55-60%, mild AS]     Past Medical History:  Diagnosis Date   Anemia of chronic disease    CAD (coronary artery disease)    Diabetes mellitus without complication ([place])    ESRD (end stage renal disease) on dialysis ([place])    MWF in [place]   History of bleeding ulcers    History of TIAs    Hypertension    NSTEMI (non-ST elevated myocardial infarction) ([place]) 04/2021   PAD (peripheral artery disease) ([facility])    Renal cancer, right ([facility])     s/p right radical nephrectomy 01/08/19    Past Surgical History:  Procedure Laterality Date   ABDOMINAL AORTOGRAM W/LOWER EXTREMITY Bilateral 04/14/2021   Procedure: ABDOMINAL AORTOGRAM W/LOWER EXTREMITY;  Surgeon: Elam Dutch, MD;  Location: [place] [facility];  Service: Vascular;  Laterality: Bilateral;   AMPUTATION Left 05/12/2021   Procedure: LEFT BELOW KNEE AMPUTATION;  Surgeon: Newt Minion, MD;  Location: [place];  Service: Orthopedics;  Laterality: Left;   CATARACT EXTRACTION W/PHACO Left 02/15/2014   Procedure: CATARACT EXTRACTION PHACO AND INTRAOCULAR LENS PLACEMENT (IOC);  Surgeon: Tonny Branch, MD;  Location: [facility];  Service: Ophthalmology;  Laterality: Left;  CDE 10.84   CATARACT EXTRACTION W/PHACO Right 12/14/2013   Procedure: CATARACT EXTRACTION PHACO AND INTRAOCULAR LENS PLACEMENT (IOC);  Surgeon: Tonny Branch, MD;  Location: [facility];  Service: Ophthalmology;  Laterality: Right;  CDE:  16.30   CHOLECYSTECTOMY  02/2013   EYE SURGERY     IR FLUORO GUIDE CV LINE RIGHT  01/05/2019   IR US GUIDE VASC ACCESS RIGHT  01/05/2019   LAPAROSCOPIC NEPHRECTOMY Right 01/08/2019   Procedure: LAPAROSCOPIC RADICAL NEPHRECTOMY;  Surgeon: Raynelle Bring, MD;  Location: [facility];  Service: Urology;  Laterality: Right;   LEFT HEART CATH AND CORS/GRAFTS ANGIOGRAPHY N/A 04/14/2021   Procedure: LEFT HEART CATH AND CORS/GRAFTS ANGIOGRAPHY;  Surgeon: Jolaine Artist, MD;  Location: [place] [facility];  Service: Cardiovascular;  Laterality: N/A;   PENILE PROSTHESIS IMPLANT     PERIPHERAL VASCULAR INTERVENTION Left 04/19/2021   Procedure: PERIPHERAL VASCULAR INTERVENTION;  Surgeon: Cherre Robins, MD;  Location: [place] [facility];  Service: Cardiovascular;  Laterality: Left;   TOE AMPUTATION Right 2013   [facility]    MEDICATIONS: No current facility-administered medications for this encounter.    acetaminophen (TYLENOL) 325 MG tablet   aspirin 81 MG EC tablet   atorvastatin (LIPITOR) 80 MG  tablet   bethanechol (URECHOLINE) 25 MG tablet   blood glucose meter kit and supplies KIT   Blood Glucose Monitoring Suppl (BAYER CONTOUR MONITOR) w/Device KIT   calcitRIOL (ROCALTROL) 0.25 MCG capsule   cinacalcet (SENSIPAR) 30 MG tablet   clonazePAM (KLONOPIN) 0.25 MG disintegrating tablet   clopidogrel (PLAVIX) 75 MG tablet   doxycycline (VIBRA-TABS) 100 MG tablet   furosemide (LASIX) 80 MG tablet   gabapentin (NEURONTIN) 300 MG capsule   gentamicin cream (GARAMYCIN) 0.1 %   glucose blood (BAYER CONTOUR TEST) test strip   Insulin Glargine (BASAGLAR KWIKPEN) 100 UNIT/ML   Insulin Pen Needle (B-D ULTRAFINE III SHORT PEN) 31G X 8 MM MISC   Lancets MISC   linaclotide (LINZESS) 290 MCG CAPS capsule   metaxalone (SKELAXIN) 400 MG tablet   midodrine (PROAMATINE) 10 MG tablet   multivitamin (RENA-VIT) TABS tablet   oxyCODONE-acetaminophen (PERCOCET/ROXICET) 5-325 MG tablet   pantoprazole (PROTONIX)  40 MG tablet   pentoxifylline (TRENTAL) 400 MG CR tablet   polyethylene glycol (MIRALAX / GLYCOLAX) 17 g packet   QUEtiapine (SEROQUEL) 50 MG tablet   senna-docusate (SENOKOT-S) 8.6-50 MG tablet   tamsulosin (FLOMAX) 0.4 MG CAPS capsule   traMADol (ULTRAM) 50 MG tablet   venlafaxine XR (EFFEXOR-XR) 37.5 MG 24 hr capsule   zinc sulfate 220 (50 Zn) MG capsule    Myra Gianotti, PA-C Surgical Short Stay/Anesthesiology [facility] Phone [phone] [facility] Phone [phone] 07/06/2021 5:22 PM

## 2021-07-06 NOTE — Progress Notes (Signed)
PCP - Dr. Chauncey Reading Cardiologist - pt denies EKG - 06/16/21 Chest x-ray - 06/15/21 ECHO - 05/15/21 Cardiac Cath - 04/14/21 CPAP - no   Fasting Blood Sugar:  100-220 Checks Blood Sugar:  4x/day Pt has CBG monitor on Right upper arm  Blood Thinner Instructions: Follow your surgeon's instructions on when to stop Aspirin and Plavix.  If no instructions were given by your surgeon then you will need to call the office to get those instructions.    Aspirin Instructions: "  ERAS Protcol - clears 0840 DOS  COVID TEST- DOS  Anesthesia review: yes  -------------  SDW INSTRUCTIONS:  Your procedure is scheduled on Friday 9/2. Please report to [facility] Main Entrance "A" at [email or web address] and check in at the Admitting office. Call this number if you have problems the morning of surgery: [phone]   Remember: Do not eat after midnight the night before your surgery  You may drink clear liquids until 0840 AM the morning of your surgery.   Clear liquids allowed are: Water, Non-Citrus Juices (without pulp), Carbonated Beverages, Clear Tea, Black Coffee Only, and Gatorade   Medications to take morning of surgery with a sip of water include: acetaminophen (TYLENOL) - if needed doxycycline (VIBRA-TABS)  gabapentin (NEURONTIN)  pantoprazole (PROTONIX)  As of today, STOP taking any Aspirin (unless otherwise instructed by your surgeon), Aleve, Naproxen, Ibuprofen, Motrin, Advil, Goody's, BC's, all herbal medications, fish oil, and all vitamins.  ** PLEASE check your blood sugar the morning of your surgery when you wake up and every 2 hours until you get to the Short Stay unit.  If your blood sugar is less than 70 mg/dL, you will need to treat for low blood sugar: Do not take insulin. Treat a low blood sugar (less than 70 mg/dL) with  cup of clear juice (cranberry or apple), 4 glucose tablets, OR glucose gel. Recheck blood sugar in 15 minutes after treatment (to make sure it is greater than 70  mg/dL). If your blood sugar is not greater than 70 mg/dL on recheck, call [phone] for further instructions.   Basalar: 9/1 2.5 units PM 9/2 none  Humalog 9/1: PM none 9/2: none; if CBG >220, take half usual dose   The Morning of Surgery Do not wear jewelry Do not wear lotions, powders, colognes, or deodorant Do not bring valuables to the hospital. [facility] is not responsible for any belongings or valuables.  If you are a smoker, DO NOT Smoke 24 hours prior to surgery  If you wear a CPAP at night please bring your mask the morning of surgery   Remember that you must have someone to transport you home after your surgery, and remain with you for 24 hours if you are discharged the same day.  Please bring cases for contacts, glasses, hearing aids, dentures or bridgework because it cannot be worn into surgery.   Patients discharged the day of surgery will not be allowed to drive home.   Please shower the NIGHT BEFORE/MORNING OF SURGERY (use antibacterial soap like DIAL soap if possible). Wear comfortable clothes the morning of surgery. Oral Hygiene is also important to reduce your risk of infection.  Remember - BRUSH YOUR TEETH THE MORNING OF SURGERY WITH YOUR REGULAR TOOTHPASTE  Patient denies shortness of breath, fever, cough and chest pain.

## 2021-07-07 ENCOUNTER — Inpatient Hospital Stay (HOSPITAL_COMMUNITY)
Admission: RE | Admit: 2021-07-07 | Discharge: 2021-07-08 | DRG: 474 | Disposition: A | Discharge status: Home Health | Payer: Managed Care, Other (non HMO) | Attending: Orthopedic Surgery | Admitting: Orthopedic Surgery

## 2021-07-07 ENCOUNTER — Encounter (HOSPITAL_COMMUNITY)
Admission: RE | Disposition: A | Discharge status: Home Health | Payer: Self-pay | Source: Home / Self Care | Attending: Orthopedic Surgery

## 2021-07-07 ENCOUNTER — Other Ambulatory Visit: Payer: Self-pay

## 2021-07-07 ENCOUNTER — Inpatient Hospital Stay (HOSPITAL_COMMUNITY): Payer: Managed Care, Other (non HMO) | Admitting: Vascular Surgery

## 2021-07-07 ENCOUNTER — Encounter (HOSPITAL_COMMUNITY): Payer: Self-pay | Admitting: Orthopedic Surgery

## 2021-07-07 DIAGNOSIS — I2510 Atherosclerotic heart disease of native coronary artery without angina pectoris: Secondary | ICD-10-CM | POA: Diagnosis present

## 2021-07-07 DIAGNOSIS — Z905 Acquired absence of kidney: Secondary | ICD-10-CM

## 2021-07-07 DIAGNOSIS — Z85528 Personal history of other malignant neoplasm of kidney: Secondary | ICD-10-CM

## 2021-07-07 DIAGNOSIS — E1152 Type 2 diabetes mellitus with diabetic peripheral angiopathy with gangrene: Secondary | ICD-10-CM | POA: Diagnosis present

## 2021-07-07 DIAGNOSIS — N186 End stage renal disease: Secondary | ICD-10-CM | POA: Diagnosis present

## 2021-07-07 DIAGNOSIS — Z8673 Personal history of transient ischemic attack (TIA), and cerebral infarction without residual deficits: Secondary | ICD-10-CM | POA: Diagnosis not present

## 2021-07-07 DIAGNOSIS — T8130XA Disruption of wound, unspecified, initial encounter: Secondary | ICD-10-CM | POA: Diagnosis present

## 2021-07-07 DIAGNOSIS — Z87891 Personal history of nicotine dependence: Secondary | ICD-10-CM | POA: Diagnosis not present

## 2021-07-07 DIAGNOSIS — Z809 Family history of malignant neoplasm, unspecified: Secondary | ICD-10-CM

## 2021-07-07 DIAGNOSIS — T8781 Dehiscence of amputation stump: Secondary | ICD-10-CM | POA: Diagnosis present

## 2021-07-07 DIAGNOSIS — Z888 Allergy status to other drugs, medicaments and biological substances status: Secondary | ICD-10-CM

## 2021-07-07 DIAGNOSIS — Z89512 Acquired absence of left leg below knee: Secondary | ICD-10-CM | POA: Diagnosis not present

## 2021-07-07 DIAGNOSIS — E1122 Type 2 diabetes mellitus with diabetic chronic kidney disease: Secondary | ICD-10-CM | POA: Diagnosis present

## 2021-07-07 DIAGNOSIS — M898X9 Other specified disorders of bone, unspecified site: Secondary | ICD-10-CM | POA: Diagnosis present

## 2021-07-07 DIAGNOSIS — Z20822 Contact with and (suspected) exposure to covid-19: Secondary | ICD-10-CM | POA: Diagnosis present

## 2021-07-07 DIAGNOSIS — D631 Anemia in chronic kidney disease: Secondary | ICD-10-CM | POA: Diagnosis present

## 2021-07-07 DIAGNOSIS — Y835 Amputation of limb(s) as the cause of abnormal reaction of the patient, or of later complication, without mention of misadventure at the time of the procedure: Secondary | ICD-10-CM | POA: Diagnosis present

## 2021-07-07 DIAGNOSIS — L03116 Cellulitis of left lower limb: Secondary | ICD-10-CM | POA: Diagnosis present

## 2021-07-07 DIAGNOSIS — Z992 Dependence on renal dialysis: Secondary | ICD-10-CM

## 2021-07-07 DIAGNOSIS — I252 Old myocardial infarction: Secondary | ICD-10-CM | POA: Diagnosis not present

## 2021-07-07 DIAGNOSIS — Z89421 Acquired absence of other right toe(s): Secondary | ICD-10-CM | POA: Diagnosis not present

## 2021-07-07 HISTORY — DX: Malignant neoplasm of right kidney, except renal pelvis: C64.1

## 2021-07-07 HISTORY — PX: STUMP REVISION: SHX6102

## 2021-07-07 LAB — GLUCOSE, CAPILLARY
Glucose-Capillary: 113 mg/dL — ABNORMAL HIGH (ref 70–99)
Glucose-Capillary: 121 mg/dL — ABNORMAL HIGH (ref 70–99)
Glucose-Capillary: 161 mg/dL — ABNORMAL HIGH (ref 70–99)
Glucose-Capillary: 226 mg/dL — ABNORMAL HIGH (ref 70–99)

## 2021-07-07 LAB — POCT I-STAT, CHEM 8
BUN: 39 mg/dL — ABNORMAL HIGH (ref 8–23)
Calcium, Ion: 1.1 mmol/L — ABNORMAL LOW (ref 1.15–1.40)
Chloride: 97 mmol/L — ABNORMAL LOW (ref 98–111)
Creatinine, Ser: 4.8 mg/dL — ABNORMAL HIGH (ref 0.61–1.24)
Glucose, Bld: 116 mg/dL — ABNORMAL HIGH (ref 70–99)
HCT: 37 % — ABNORMAL LOW (ref 39.0–52.0)
Hemoglobin: 12.6 g/dL — ABNORMAL LOW (ref 13.0–17.0)
Potassium: 4.6 mmol/L (ref 3.5–5.1)
Sodium: 136 mmol/L (ref 135–145)
TCO2: 29 mmol/L (ref 22–32)

## 2021-07-07 LAB — SARS CORONAVIRUS 2 BY RT PCR (HOSPITAL ORDER, PERFORMED IN ~~LOC~~ HOSPITAL LAB): SARS Coronavirus 2: NEGATIVE

## 2021-07-07 SURGERY — REVISION, AMPUTATION SITE
Anesthesia: General | Site: Leg Lower | Laterality: Left

## 2021-07-07 MED ORDER — RENA-VITE PO TABS
1.0000 | ORAL_TABLET | Freq: Every day | ORAL | Status: DC
  Administered 2021-07-07: 1 via ORAL
  Filled 2021-07-07: qty 1

## 2021-07-07 MED ORDER — PANTOPRAZOLE SODIUM 40 MG PO TBEC
40.0000 mg | DELAYED_RELEASE_TABLET | Freq: Every day | ORAL | Status: DC
  Administered 2021-07-08: 40 mg via ORAL
  Filled 2021-07-07: qty 1

## 2021-07-07 MED ORDER — CEFAZOLIN SODIUM-DEXTROSE 2-4 GM/100ML-% IV SOLN
2.0000 g | INTRAVENOUS | Status: AC
  Administered 2021-07-07: 2 g via INTRAVENOUS
  Filled 2021-07-07: qty 100

## 2021-07-07 MED ORDER — ONDANSETRON HCL 4 MG/2ML IJ SOLN: 4.0000 mg | Freq: Four times a day (QID) | INTRAMUSCULAR | Status: DC | PRN

## 2021-07-07 MED ORDER — PHENYLEPHRINE 40 MCG/ML (10ML) SYRINGE FOR IV PUSH (FOR BLOOD PRESSURE SUPPORT)
PREFILLED_SYRINGE | INTRAVENOUS | Status: DC | PRN
  Administered 2021-07-07: 80 ug via INTRAVENOUS
  Administered 2021-07-07 (×3): 120 ug via INTRAVENOUS
  Administered 2021-07-07: 160 ug via INTRAVENOUS

## 2021-07-07 MED ORDER — ONDANSETRON HCL 4 MG/2ML IJ SOLN
INTRAMUSCULAR | Status: DC | PRN
  Administered 2021-07-07: 4 mg via INTRAVENOUS

## 2021-07-07 MED ORDER — FERRIC CITRATE 1 GM 210 MG(FE) PO TABS
210.0000 mg | ORAL_TABLET | Freq: Three times a day (TID) | ORAL | Status: DC
  Administered 2021-07-07 – 2021-07-08 (×2): 210 mg via ORAL
  Filled 2021-07-07 (×4): qty 1

## 2021-07-07 MED ORDER — SODIUM CHLORIDE 0.9 % IV SOLN
1.0000 g | INTRAVENOUS | Status: DC
  Filled 2021-07-07: qty 1

## 2021-07-07 MED ORDER — ZINC SULFATE 220 (50 ZN) MG PO CAPS
220.0000 mg | ORAL_CAPSULE | Freq: Every day | ORAL | Status: DC
  Administered 2021-07-08: 220 mg via ORAL
  Filled 2021-07-07: qty 1

## 2021-07-07 MED ORDER — SODIUM CHLORIDE 0.9 % IV SOLN: INTRAVENOUS | Status: DC

## 2021-07-07 MED ORDER — PHENOL 1.4 % MT LIQD: 1.0000 | OROMUCOSAL | Status: DC | PRN

## 2021-07-07 MED ORDER — LACTATED RINGERS IV SOLN: INTRAVENOUS | Status: DC

## 2021-07-07 MED ORDER — METAXALONE 800 MG PO TABS
400.0000 mg | ORAL_TABLET | Freq: Three times a day (TID) | ORAL | Status: DC | PRN
  Filled 2021-07-07: qty 1
  Filled 2021-07-07: qty 0.5

## 2021-07-07 MED ORDER — ATORVASTATIN CALCIUM 80 MG PO TABS
80.0000 mg | ORAL_TABLET | Freq: Every day | ORAL | Status: DC
  Administered 2021-07-07 – 2021-07-08 (×2): 80 mg via ORAL
  Filled 2021-07-07 (×2): qty 1

## 2021-07-07 MED ORDER — MAGNESIUM CITRATE PO SOLN
1.0000 | Freq: Once | ORAL | Status: DC | PRN
  Filled 2021-07-07: qty 296

## 2021-07-07 MED ORDER — HYDROMORPHONE HCL 1 MG/ML IJ SOLN: 0.5000 mg | INTRAMUSCULAR | Status: DC | PRN

## 2021-07-07 MED ORDER — DOCUSATE SODIUM 100 MG PO CAPS
100.0000 mg | ORAL_CAPSULE | Freq: Every day | ORAL | Status: DC
  Administered 2021-07-08: 100 mg via ORAL
  Filled 2021-07-07: qty 1

## 2021-07-07 MED ORDER — GABAPENTIN 100 MG PO CAPS
100.0000 mg | ORAL_CAPSULE | Freq: Every day | ORAL | Status: DC
  Administered 2021-07-07: 100 mg via ORAL
  Filled 2021-07-07: qty 1

## 2021-07-07 MED ORDER — CLONIDINE HCL (ANALGESIA) 100 MCG/ML EP SOLN
EPIDURAL | Status: DC | PRN
  Administered 2021-07-07: 33 ug
  Administered 2021-07-07: 67 ug

## 2021-07-07 MED ORDER — CHLORHEXIDINE GLUCONATE 0.12 % MT SOLN
15.0000 mL | Freq: Once | OROMUCOSAL | Status: AC
  Administered 2021-07-07: 15 mL via OROMUCOSAL
  Filled 2021-07-07: qty 15

## 2021-07-07 MED ORDER — MIDAZOLAM HCL 2 MG/2ML IJ SOLN
INTRAMUSCULAR | Status: AC
  Administered 2021-07-07: 1 mg via INTRAVENOUS
  Filled 2021-07-07: qty 2

## 2021-07-07 MED ORDER — CINACALCET HCL 30 MG PO TABS: 30.0000 mg | ORAL_TABLET | ORAL | Status: DC

## 2021-07-07 MED ORDER — FLUMAZENIL 0.5 MG/5ML IV SOLN
INTRAVENOUS | Status: DC | PRN
  Administered 2021-07-07: .1 mg via INTRAVENOUS

## 2021-07-07 MED ORDER — INSULIN ASPART 100 UNIT/ML IJ SOLN
0.0000 [IU] | INTRAMUSCULAR | Status: DC
  Administered 2021-07-07: 2 [IU] via SUBCUTANEOUS
  Administered 2021-07-07: 3 [IU] via SUBCUTANEOUS
  Administered 2021-07-08: 2 [IU] via SUBCUTANEOUS

## 2021-07-07 MED ORDER — ASPIRIN EC 81 MG PO TBEC
81.0000 mg | DELAYED_RELEASE_TABLET | Freq: Every day | ORAL | Status: DC
  Administered 2021-07-08: 81 mg via ORAL
  Filled 2021-07-07: qty 1

## 2021-07-07 MED ORDER — JUVEN PO PACK
1.0000 | PACK | Freq: Two times a day (BID) | ORAL | Status: DC
  Administered 2021-07-07 – 2021-07-08 (×2): 1 via ORAL
  Filled 2021-07-07 (×2): qty 1

## 2021-07-07 MED ORDER — INSULIN ASPART 100 UNIT/ML IJ SOLN: 0.0000 [IU] | Freq: Three times a day (TID) | INTRAMUSCULAR | Status: DC

## 2021-07-07 MED ORDER — GUAIFENESIN-DM 100-10 MG/5ML PO SYRP: 15.0000 mL | ORAL_SOLUTION | ORAL | Status: DC | PRN

## 2021-07-07 MED ORDER — FUROSEMIDE 40 MG PO TABS
80.0000 mg | ORAL_TABLET | Freq: Two times a day (BID) | ORAL | Status: DC
  Administered 2021-07-07: 80 mg via ORAL
  Filled 2021-07-07: qty 2

## 2021-07-07 MED ORDER — POLYETHYLENE GLYCOL 3350 17 G PO PACK: 17.0000 g | PACK | Freq: Every day | ORAL | Status: DC | PRN

## 2021-07-07 MED ORDER — POTASSIUM CHLORIDE CRYS ER 20 MEQ PO TBCR: 20.0000 meq | EXTENDED_RELEASE_TABLET | Freq: Every day | ORAL | Status: DC | PRN

## 2021-07-07 MED ORDER — PROPOFOL 10 MG/ML IV BOLUS
INTRAVENOUS | Status: DC | PRN
  Administered 2021-07-07: 100 mg via INTRAVENOUS

## 2021-07-07 MED ORDER — CEFAZOLIN SODIUM-DEXTROSE 2-4 GM/100ML-% IV SOLN
2.0000 g | Freq: Once | INTRAVENOUS | Status: AC
  Administered 2021-07-08: 2 g via INTRAVENOUS
  Filled 2021-07-07: qty 100

## 2021-07-07 MED ORDER — FENTANYL CITRATE (PF) 100 MCG/2ML IJ SOLN
INTRAMUSCULAR | Status: AC
  Administered 2021-07-07: 50 ug via INTRAVENOUS
  Filled 2021-07-07: qty 2

## 2021-07-07 MED ORDER — FENTANYL CITRATE (PF) 100 MCG/2ML IJ SOLN: 50.0000 ug | Freq: Once | INTRAMUSCULAR | Status: AC

## 2021-07-07 MED ORDER — BUPIVACAINE-EPINEPHRINE (PF) 0.5% -1:200000 IJ SOLN
INTRAMUSCULAR | Status: DC | PRN
  Administered 2021-07-07: 10 mL via PERINEURAL
  Administered 2021-07-07: 20 mL via PERINEURAL

## 2021-07-07 MED ORDER — INSULIN GLARGINE-YFGN 100 UNIT/ML ~~LOC~~ SOLN
5.0000 [IU] | Freq: Every day | SUBCUTANEOUS | Status: DC
  Administered 2021-07-07: 5 [IU] via SUBCUTANEOUS
  Filled 2021-07-07 (×2): qty 0.05

## 2021-07-07 MED ORDER — ACETAMINOPHEN 325 MG PO TABS
325.0000 mg | ORAL_TABLET | Freq: Four times a day (QID) | ORAL | Status: DC | PRN
  Administered 2021-07-08: 650 mg via ORAL
  Filled 2021-07-07: qty 2

## 2021-07-07 MED ORDER — 0.9 % SODIUM CHLORIDE (POUR BTL) OPTIME
TOPICAL | Status: DC | PRN
  Administered 2021-07-07: 1000 mL

## 2021-07-07 MED ORDER — DEXAMETHASONE SODIUM PHOSPHATE 10 MG/ML IJ SOLN
INTRAMUSCULAR | Status: DC | PRN
  Administered 2021-07-07: 10 mg via INTRAVENOUS

## 2021-07-07 MED ORDER — EPHEDRINE SULFATE-NACL 50-0.9 MG/10ML-% IV SOSY
PREFILLED_SYRINGE | INTRAVENOUS | Status: DC | PRN
  Administered 2021-07-07 (×4): 10 mg via INTRAVENOUS

## 2021-07-07 MED ORDER — MIDODRINE HCL 5 MG PO TABS: 5.0000 mg | ORAL_TABLET | ORAL | Status: DC

## 2021-07-07 MED ORDER — MIDAZOLAM HCL 2 MG/2ML IJ SOLN: 1.0000 mg | Freq: Once | INTRAMUSCULAR | Status: AC

## 2021-07-07 MED ORDER — BISACODYL 5 MG PO TBEC: 5.0000 mg | DELAYED_RELEASE_TABLET | Freq: Every day | ORAL | Status: DC | PRN

## 2021-07-07 MED ORDER — ALUM & MAG HYDROXIDE-SIMETH 200-200-20 MG/5ML PO SUSP: 15.0000 mL | ORAL | Status: DC | PRN

## 2021-07-07 MED ORDER — ORAL CARE MOUTH RINSE: 15.0000 mL | Freq: Once | OROMUCOSAL | Status: AC

## 2021-07-07 MED ORDER — TRANEXAMIC ACID-NACL 1000-0.7 MG/100ML-% IV SOLN
1000.0000 mg | Freq: Once | INTRAVENOUS | Status: AC
  Administered 2021-07-07: 1000 mg via INTRAVENOUS
  Filled 2021-07-07: qty 100

## 2021-07-07 MED ORDER — CLONAZEPAM 0.5 MG PO TABS
0.5000 mg | ORAL_TABLET | Freq: Every day | ORAL | Status: DC
  Administered 2021-07-07: 0.5 mg via ORAL
  Filled 2021-07-07: qty 1

## 2021-07-07 MED ORDER — LIDOCAINE 2% (20 MG/ML) 5 ML SYRINGE
INTRAMUSCULAR | Status: DC | PRN
  Administered 2021-07-07: 100 mg via INTRAVENOUS

## 2021-07-07 MED ORDER — OXYCODONE HCL 5 MG PO TABS: 5.0000 mg | ORAL_TABLET | ORAL | Status: DC | PRN

## 2021-07-07 MED ORDER — CLOPIDOGREL BISULFATE 75 MG PO TABS
75.0000 mg | ORAL_TABLET | Freq: Every day | ORAL | Status: DC
  Administered 2021-07-08: 75 mg via ORAL
  Filled 2021-07-07: qty 1

## 2021-07-07 SURGICAL SUPPLY — 33 items
BAG COUNTER SPONGE SURGICOUNT (BAG) ×2 IMPLANT
BAG SPNG CNTER NS LX DISP (BAG) ×1
BAG SURGICOUNT SPONGE COUNTING (BAG) ×1
BLADE SAW RECIP 87.9 MT (BLADE) IMPLANT
BLADE SURG 21 STRL SS (BLADE) ×3 IMPLANT
CANISTER WOUND CARE 500ML ATS (WOUND CARE) ×3 IMPLANT
COVER SURGICAL LIGHT HANDLE (MISCELLANEOUS) ×3 IMPLANT
DRAPE DERMATAC (DRAPES) ×6 IMPLANT
DRAPE EXTREMITY T 121X128X90 (DISPOSABLE) ×3 IMPLANT
DRAPE HALF SHEET 40X57 (DRAPES) ×3 IMPLANT
DRAPE INCISE IOBAN 66X45 STRL (DRAPES) ×3 IMPLANT
DRAPE U-SHAPE 47X51 STRL (DRAPES) ×6 IMPLANT
DRESSING PREVENA PLUS CUSTOM (GAUZE/BANDAGES/DRESSINGS) ×1 IMPLANT
DRSG PREVENA PLUS CUSTOM (GAUZE/BANDAGES/DRESSINGS) ×3
DURAPREP 26ML APPLICATOR (WOUND CARE) ×3 IMPLANT
ELECT REM PT RETURN 9FT ADLT (ELECTROSURGICAL) ×3
ELECTRODE REM PT RTRN 9FT ADLT (ELECTROSURGICAL) ×1 IMPLANT
GLOVE SURG ORTHO LTX SZ9 (GLOVE) ×3 IMPLANT
GLOVE SURG UNDER POLY LF SZ9 (GLOVE) ×3 IMPLANT
GOWN STRL REUS W/ TWL XL LVL3 (GOWN DISPOSABLE) ×2 IMPLANT
GOWN STRL REUS W/TWL XL LVL3 (GOWN DISPOSABLE) ×6
KIT BASIN OR (CUSTOM PROCEDURE TRAY) ×3 IMPLANT
KIT TURNOVER KIT B (KITS) ×3 IMPLANT
MANIFOLD NEPTUNE II (INSTRUMENTS) ×3 IMPLANT
NS IRRIG 1000ML POUR BTL (IV SOLUTION) ×3 IMPLANT
PACK GENERAL/GYN (CUSTOM PROCEDURE TRAY) ×3 IMPLANT
PAD ARMBOARD 7.5X6 YLW CONV (MISCELLANEOUS) ×3 IMPLANT
PREVENA RESTOR ARTHOFORM 46X30 (CANNISTER) ×3 IMPLANT
STAPLER VISISTAT 35W (STAPLE) ×2 IMPLANT
SUT ETHILON 2 0 PSLX (SUTURE) ×6 IMPLANT
SUT SILK 2 0 (SUTURE)
SUT SILK 2-0 18XBRD TIE 12 (SUTURE) IMPLANT
TOWEL GREEN STERILE (TOWEL DISPOSABLE) ×3 IMPLANT

## 2021-07-07 NOTE — Consult Note (Signed)
[facility] Renal Consultation Note    Indication for Consultation:  Management of ESRD/hemodialysis, anemia, hypertension/volume, and secondary hyperparathyroidism. PCP:  HPI: Maxwell FREILICH is a 65 y.o. 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  Seen after surgery and extremely drowsy, unable to answer questions at this time.   Per wife, dialyzes at [facility] on MWF schedule. Last HD was Wednesday, which he tolerated without issues. Uses LUE AVF as his access.    Past Medical History:  Diagnosis Date   Anemia of chronic disease    CAD (coronary artery disease)    Diabetes mellitus without complication (HCC)    ESRD (end stage renal disease) on dialysis ([place])    MWF in [place]   History of bleeding ulcers    History of TIAs    Hypertension    NSTEMI (non-ST elevated myocardial infarction) ([place]) 04/2021   PAD (peripheral artery disease) (HCC)    Renal cancer, right ([place])    s/p right radical nephrectomy 01/08/19   Past Surgical History:  Procedure Laterality Date   ABDOMINAL AORTOGRAM W/LOWER EXTREMITY Bilateral 04/14/2021   Procedure: ABDOMINAL AORTOGRAM W/LOWER EXTREMITY;  Surgeon: Elam Dutch, MD;  Location: [facility];  Service: Vascular;  Laterality: Bilateral;   AMPUTATION Left 05/12/2021   Procedure: LEFT BELOW KNEE AMPUTATION;  Surgeon: Newt Minion, MD;  Location: [facility];  Service: Orthopedics;  Laterality: Left;   CATARACT EXTRACTION W/PHACO Left 02/15/2014   Procedure: CATARACT EXTRACTION PHACO AND INTRAOCULAR LENS PLACEMENT (IOC);  Surgeon: Tonny Branch, MD;  Location: [facility];  Service: Ophthalmology;  Laterality: Left;  CDE 10.84   CATARACT EXTRACTION W/PHACO Right 12/14/2013   Procedure: CATARACT EXTRACTION PHACO AND INTRAOCULAR LENS PLACEMENT (IOC);  Surgeon: Tonny Branch, MD;  Location: [facility];  Service: Ophthalmology;   Laterality: Right;  CDE:  16.30   CHOLECYSTECTOMY  02/2013   EYE SURGERY     IR FLUORO GUIDE CV LINE RIGHT  01/05/2019   IR US GUIDE VASC ACCESS RIGHT  01/05/2019   LAPAROSCOPIC NEPHRECTOMY Right 01/08/2019   Procedure: LAPAROSCOPIC RADICAL NEPHRECTOMY;  Surgeon: Raynelle Bring, MD;  Location: [facility];  Service: Urology;  Laterality: Right;   LEFT HEART CATH AND CORS/GRAFTS ANGIOGRAPHY N/A 04/14/2021   Procedure: LEFT HEART CATH AND CORS/GRAFTS ANGIOGRAPHY;  Surgeon: Jolaine Artist, MD;  Location: [facility];  Service: Cardiovascular;  Laterality: N/A;   PENILE PROSTHESIS IMPLANT     PERIPHERAL VASCULAR INTERVENTION Left 04/19/2021   Procedure: PERIPHERAL VASCULAR INTERVENTION;  Surgeon: Cherre Robins, MD;  Location: [facility];  Service: Cardiovascular;  Laterality: Left;   TOE AMPUTATION Right 2013   [facility]   Family History  Problem Relation Age of Onset   Stroke Mother    Stroke Father    Cancer Father    Cancer Brother    Social History:  reports that he has never smoked. He has never used smokeless tobacco. He reports current alcohol use. He reports that he does not use drugs.  ROS: Unable to obtain, very drowsy s/p surgery.  Physical Exam: Vitals:   07/07/21 1202 07/07/21 1217 07/07/21 1340 07/07/21 1607  BP: 112/68 111/70 105/69 127/71  Pulse: 73 73 73 80  Resp: '15 16 16 16  ' Temp:    (!) 97.5 F (36.4 C)  TempSrc:    Oral  SpO2:  97% 94% 90% 92%  Weight:      Height:         General: Well developed, no acute distress. Nasal O2 in place. Head: Normocephalic, atraumatic, sclera non-icteric, mucus membranes are moist. Neck: Supple without lymphadenopathy/masses.  Lungs: Clear bilaterally to auscultation without wheezes, rales, or rhonchi. Heart: RRR; 3/6 systolic murmur Abdomen: Soft, non-tender, non-distended with normoactive bowel sounds.  Lower extremities: No RLE edema; L stump in hard brace with wound vac Dialysis Access: LUE  AVF + thrill  Allergies  Allergen Reactions   Ambien [Zolpidem] Other (See Comments)    It causes pt to have insomnia, NOT sleep- idiosyncratic reaction   Prior to Admission medications   Medication Sig Start Date End Date Taking? Authorizing Provider  acetaminophen (TYLENOL) 325 MG tablet Take 1-2 tablets (325-650 mg total) by mouth every 4 (four) hours as needed for mild pain. 05/30/21  Yes Love, Ivan Anchors, PA-C  aspirin 81 MG EC tablet Take 1 tablet (81 mg total) by mouth daily. 04/20/21 07/19/21 Yes Dessa Phi, DO  atorvastatin (LIPITOR) 80 MG tablet Take 1 tablet (80 mg total) by mouth daily. Patient taking differently: Take 80 mg by mouth at bedtime. 06/08/21  Yes Love, Ivan Anchors, PA-C  AURYXIA 1 GM 210 MG(Fe) tablet Take 210-420 mg by mouth See admin instructions. Take 1 tablet by mouth with each home meal, and if eating out take 2 tablet with that meal 06/16/21  Yes [provider]  cefdinir (OMNICEF) 300 MG capsule Take 300 mg by mouth every 12 (twelve) hours. 07/04/21  Yes [provider]  cinacalcet (SENSIPAR) 30 MG tablet Take 1 tablet (30 mg total) by mouth once a week. Take on Mondays Patient taking differently: Take 30 mg by mouth every Monday. 06/08/21  Yes Love, Ivan Anchors, PA-C  clonazePAM (KLONOPIN) 0.5 MG tablet Take 0.5 mg by mouth at bedtime.   Yes [provider]  clopidogrel (PLAVIX) 75 MG tablet Take 1 tablet (75 mg total) by mouth daily. Patient taking differently: Take 75 mg by mouth at bedtime. 06/08/21  Yes Love, Ivan Anchors, PA-C  doxycycline (VIBRA-TABS) 100 MG tablet Take 1 tablet (100 mg total) by mouth 2 (two) times daily. Patient taking differently: Take 100 mg by mouth 2 (two) times daily. continuous 06/27/21  Yes Newt Minion, MD  furosemide (LASIX) 80 MG tablet Take 1 tablet (80 mg total) by mouth 2 (two) times daily. 06/08/21  Yes Love, Ivan Anchors, PA-C  gabapentin (NEURONTIN) 100 MG capsule Take 100 mg by mouth at bedtime.   Yes [provider]  gentamicin cream (GARAMYCIN) 0.1 % Apply topically daily. Patient taking differently: Apply 1 application topically 3 (three) times a week. 05/18/21  Yes Little Ishikawa, MD  Insulin Glargine [facility]) 100 UNIT/ML Inject 5 Units into the skin at bedtime. Patient taking differently: Inject 5-10 Units into the skin at bedtime. Sliding scale 04/20/21 07/19/21 Yes Dessa Phi, DO  metaxalone (SKELAXIN) 400 MG tablet Take 1 tablet (400 mg total) by mouth 3 (three) times daily as needed for muscle spasms (please offer- pt forgets to ask). 06/08/21  Yes Love, Ivan Anchors, PA-C  midodrine (PROAMATINE) 10 MG tablet Take 0.5 tablets (5 mg total) by mouth every Monday, Wednesday, and Friday with hemodialysis. 06/08/21  Yes Love, Ivan Anchors, PA-C  multivitamin (RENA-VIT) TABS tablet Take 1 tablet by mouth at bedtime. 06/08/21  Yes Love, Ivan Anchors, PA-C  oxyCODONE-acetaminophen (PERCOCET/ROXICET) 5-325 MG tablet Take 1 tablet by  mouth every 8 (eight) hours as needed for severe pain. 06/08/21  Yes Love, Ivan Anchors, PA-C  pentoxifylline (TRENTAL) 400 MG CR tablet Take 1 tablet (400 mg total) by mouth 3 (three) times daily with meals. 05/02/21  Yes Newt Minion, MD  polyethylene glycol (MIRALAX / GLYCOLAX) 17 g packet Take 17 g by mouth daily as needed for mild constipation. 06/08/21  Yes Love, Ivan Anchors, PA-C  traMADol (ULTRAM) 50 MG tablet Take 1 tablet (50 mg total) by mouth every 6 (six) hours as needed for moderate pain. 06/08/21  Yes Love, Ivan Anchors, PA-C  venlafaxine XR (EFFEXOR-XR) 37.5 MG 24 hr capsule Take 37.5 mg by mouth daily. 06/12/21  Yes [provider]  zinc sulfate 220 (50 Zn) MG capsule Take 1 capsule (220 mg total) by mouth daily. 05/18/21  Yes Little Ishikawa, MD  bethanechol (URECHOLINE) 25 MG tablet Take 1 tablet (25 mg total) by mouth 3 (three) times daily. Patient not taking: No sig reported 06/08/21   Love, Ivan Anchors, PA-C  blood glucose meter kit and supplies KIT Dispense  based on patient and insurance preference. Use up to four times daily as directed. (FOR ICD-9 250.00, 250.01). 11/08/15   Cassandria Anger, MD  Blood Glucose Monitoring Suppl (BAYER CONTOUR MONITOR) w/Device KIT 1 each by Does not apply route 4 (four) times daily. Test 4 x daily 11/08/15   Cassandria Anger, MD  calcitRIOL (ROCALTROL) 0.25 MCG capsule Take 1 capsule (0.25 mcg total) by mouth 2 (two) times daily. Patient not taking: Reported on 07/07/2021 06/08/21   Love, Ivan Anchors, PA-C  clonazePAM (KLONOPIN) 0.25 MG disintegrating tablet Take 1 tablet (0.25 mg total) by mouth 2 (two) times daily as needed (Anxiety). Patient not taking: No sig reported 06/08/21   Love, Ivan Anchors, PA-C  gabapentin (NEURONTIN) 300 MG capsule Take 1 capsule (300 mg total) by mouth at bedtime. Patient not taking: No sig reported 06/08/21   Love, Ivan Anchors, PA-C  glucose blood (BAYER CONTOUR TEST) test strip Use as instructed 4 x daily 11/08/15   Cassandria Anger, MD  Insulin Pen Needle (B-D ULTRAFINE III SHORT PEN) 31G X 8 MM MISC 1 each by Does not apply route as directed. 09/02/15   Cassandria Anger, MD  Lancets MISC 1 each by Does not apply route 4 (four) times daily. 11/08/15   Cassandria Anger, MD  linaclotide Rolan Lipa) 290 MCG CAPS capsule Take 1 capsule (290 mcg total) by mouth daily before breakfast. Patient not taking: No sig reported 06/08/21   Love, Ivan Anchors, PA-C  pantoprazole (PROTONIX) 40 MG tablet Take 1 tablet (40 mg total) by mouth daily. Patient not taking: No sig reported 05/18/21   Little Ishikawa, MD  QUEtiapine (SEROQUEL) 50 MG tablet TAKE 1 TABLET BY MOUTH EVERYDAY AT BEDTIME Patient not taking: Reported on 07/07/2021 07/04/21   Lovorn, Jinny Blossom, MD  senna-docusate (SENOKOT-S) 8.6-50 MG tablet Take 2 tablets by mouth 2 (two) times daily. Patient not taking: Reported on 07/07/2021 06/08/21   Bary Leriche, PA-C  tamsulosin (FLOMAX) 0.4 MG CAPS capsule Take 2 capsules (0.8 mg total) by mouth at  bedtime. Patient not taking: Reported on 07/07/2021 06/08/21   Bary Leriche, PA-C   Current Facility-Administered Medications  Medication Dose Route Frequency Provider Last Rate Last Admin   0.9 %  sodium chloride infusion   Intravenous Continuous Persons, Bevely Palmer, [place]       [START ON 07/08/2021] acetaminophen (TYLENOL) tablet 325-650  mg  325-650 mg Oral Q6H PRN Persons, Bevely Palmer, PA       alum & mag hydroxide-simeth (MAALOX/MYLANTA) 200-200-20 MG/5ML suspension 15-30 mL  15-30 mL Oral Q2H PRN Persons, Bevely Palmer, [place]       [START ON 07/08/2021] aspirin EC tablet 81 mg  81 mg Oral Daily Persons, Bevely Palmer, PA       atorvastatin (LIPITOR) tablet 80 mg  80 mg Oral Daily Persons, Bevely Palmer, [place]   80 mg at 07/07/21 1600   bisacodyl (DULCOLAX) EC tablet 5 mg  5 mg Oral Daily PRN Persons, Bevely Palmer, [place]       Derrill Memo ON 07/08/2021] ceFAZolin (ANCEF) IVPB 2g/100 mL premix  2 g Intravenous Once Persons, Bevely Palmer, [place]       Derrill Memo ON 07/10/2021] cinacalcet (SENSIPAR) tablet 30 mg  30 mg Oral Q Mon-1800 Persons, Bevely Palmer, PA       clonazePAM (KLONOPIN) tablet 0.5 mg  0.5 mg Oral QHS Persons, Bevely Palmer, [place]       [START ON 07/08/2021] clopidogrel (PLAVIX) tablet 75 mg  75 mg Oral Daily Persons, Bevely Palmer, [place]       [START ON 07/08/2021] docusate sodium (COLACE) capsule 100 mg  100 mg Oral Daily Persons, Bevely Palmer, PA       ferric citrate (AURYXIA) tablet 210 mg  210 mg Oral TID WC Persons, Bevely Palmer, PA       furosemide (LASIX) tablet 80 mg  80 mg Oral BID Persons, Bevely Palmer, PA       gabapentin (NEURONTIN) capsule 100 mg  100 mg Oral QHS Persons, Bevely Palmer, PA       guaiFENesin-dextromethorphan (ROBITUSSIN DM) 100-10 MG/5ML syrup 15 mL  15 mL Oral Q4H PRN Persons, Bevely Palmer, PA       HYDROmorphone (DILAUDID) injection 0.5 mg  0.5 mg Intravenous Q4H PRN Persons, Bevely Palmer, PA       insulin aspart (novoLOG) injection 0-9 Units  0-9 Units Subcutaneous Q4H Persons, Bevely Palmer, PA       insulin glargine-yfgn [facility]) injection  5 Units  5 Units Subcutaneous QHS Persons, Bevely Palmer, PA       metaxalone [facility]) tablet 400 mg  400 mg Oral TID PRN Persons, Bevely Palmer, [place]       Derrill Memo ON 07/10/2021] midodrine (PROAMATINE) tablet 5 mg  5 mg Oral Q M,W,F-HD Persons, Bevely Palmer, PA       multivitamin (RENA-VIT) tablet 1 tablet  1 tablet Oral QHS Persons, Bevely Palmer, PA       nutrition supplement (JUVEN) (JUVEN) powder packet 1 packet  1 packet Oral BID BM Persons, Bevely Palmer, PA   1 packet at 07/07/21 1600   ondansetron (ZOFRAN) injection 4 mg  4 mg Intravenous Q6H PRN Persons, Bevely Palmer, PA       oxyCODONE (Oxy IR/ROXICODONE) immediate release tablet 5-10 mg  5-10 mg Oral Q4H PRN Persons, Bevely Palmer, [place]       [START ON 07/08/2021] pantoprazole (PROTONIX) EC tablet 40 mg  40 mg Oral Daily Persons, Bevely Palmer, PA       phenol (CHLORASEPTIC) mouth spray 1 spray  1 spray Mouth/Throat PRN Persons, Bevely Palmer, PA       polyethylene glycol (MIRALAX / GLYCOLAX) packet 17 g  17 g Oral Daily PRN Persons, Bevely Palmer, PA       potassium chloride SA (KLOR-CON) CR tablet 20-40 mEq  20-40 mEq Oral Daily PRN Persons, Bevely Palmer, [place]       [  START ON 07/08/2021] zinc sulfate capsule 220 mg  220 mg Oral Daily Persons, Bevely Palmer, PA       Labs: Basic Metabolic Panel: Recent Labs  Lab 07/07/21 0947  NA 136  K 4.6  CL 97*  GLUCOSE 116*  BUN 39*  CREATININE 4.80*    CBC: Recent Labs  Lab 07/07/21 0947  HGB 12.6*  HCT 37.0*   CBG: Recent Labs  Lab 07/07/21 0904 07/07/21 1148 07/07/21 1608  GLUCAP 113* 121* 161*    Dialysis Orders:  MWF at [facility] 3:15, 450/A1.5, EDW 86.5kg, 2K/2.5Ca, AVF, heparin 2000 - Mircera 100 q 2 weeks - Venofer 124m x 10 HBsAg negative on 06/12/21  Assessment/Plan:  L BKA dehiscence s/p revision/debridement and wound vac 07/07/21: Per ortho.  ESRD:  Usual MWF schedule. Very drowsy s/p surgery. Volume/K ok - plan to dialyze tomorrow AM.  Hypertension/volume: BP stable, no edema on exam.  Anemia:  Hgb 12.6, no ESA.  Metabolic bone disease: Labs pending, continue home binders.  T2DM  KVeneta Penton PHershal Coria9/12/2020, 4:24 PM  [facility]

## 2021-07-07 NOTE — Anesthesia Procedure Notes (Signed)
Procedure Name: LMA Insertion Date/Time: 07/07/2021 10:57 AM Performed by: Lance Coon, CRNA Pre-anesthesia Checklist: Patient identified, Emergency Drugs available, Suction available and Patient being monitored Patient Re-evaluated:Patient Re-evaluated prior to induction Oxygen Delivery Method: Circle System Utilized Preoxygenation: Pre-oxygenation with 100% oxygen Induction Type: IV induction Ventilation: Mask ventilation without difficulty LMA: LMA inserted LMA Size: 4.0 Number of attempts: 1 Airway Equipment and Method: Bite block Placement Confirmation: positive ETCO2 Tube secured with: Tape

## 2021-07-07 NOTE — Anesthesia Procedure Notes (Signed)
Anesthesia Regional Block: Adductor canal block   Pre-Anesthetic Checklist: , timeout performed,  Correct Patient, Correct Site, Correct Laterality,  Correct Procedure, Correct Position, site marked,  Risks and benefits discussed,  Pre-op evaluation,  At surgeon's request and post-op pain management  Laterality: Left  Prep: Maximum Sterile Barrier Precautions used, chloraprep       Needles:  Injection technique: Single-shot  Needle Type: Echogenic Stimulator Needle     Needle Length: 9cm  Needle Gauge: 22     Additional Needles:   Procedures:,,,, ultrasound used (permanent image in chart),,    Narrative:  Start time: 07/07/2021 10:22 AM End time: 07/07/2021 10:25 AM Injection made incrementally with aspirations every 5 mL.  Performed by: Personally  Anesthesiologist: Brennan Bailey, MD  Additional Notes: Risks, benefits, and alternative discussed. Patient gave consent for procedure. Patient prepped and draped in sterile fashion. Sedation administered, patient remains easily responsive to voice. Relevant anatomy identified with ultrasound guidance. Local anesthetic given in 5cc increments with no signs or symptoms of intravascular injection. No pain or paraesthesias with injection. Patient monitored throughout procedure with signs of LAST or immediate complications. Tolerated well. Ultrasound image placed in chart.  Tawny Asal, MD

## 2021-07-07 NOTE — Interval H&P Note (Signed)
History and Physical Interval Note:  07/07/2021 11:44 AM  Maxwell Harmon  has presented today for surgery, with the diagnosis of Dehiscence Left Below Knee Amputation.  The various methods of treatment have been discussed with the patient and family. After consideration of risks, benefits and other options for treatment, the patient has consented to  Procedure(s): REVISION LEFT BELOW KNEE AMPUTATION (Left) as a surgical intervention.  The patient's history has been reviewed, patient examined, no change in status, stable for surgery.  I have reviewed the patient's chart and labs.  Questions were answered to the patient's satisfaction.     Newt Minion

## 2021-07-07 NOTE — Op Note (Signed)
07/07/2021  11:41 AM  PATIENT:  Maxwell Harmon    PRE-OPERATIVE DIAGNOSIS:  Dehiscence Left Below Knee Amputation  POST-OPERATIVE DIAGNOSIS:  Same  PROCEDURE:  REVISION LEFT BELOW KNEE AMPUTATION Application Prevena wound VAC  SURGEON:  Newt Minion, MD  PHYSICIAN ASSISTANT:None ANESTHESIA:   General  PREOPERATIVE INDICATIONS:  Maxwell Harmon is a  65 y.o. male with a diagnosis of Dehiscence Left Below Knee Amputation who failed conservative measures and elected for surgical management.    The risks benefits and alternatives were discussed with the patient preoperatively including but not limited to the risks of infection, bleeding, nerve injury, cardiopulmonary complications, the need for revision surgery, among others, and the patient was willing to proceed.  OPERATIVE IMPLANTS: None  @ENCIMAGES @  OPERATIVE FINDINGS: Tissue margins clear no deep abscess  OPERATIVE PROCEDURE: Patient was brought the operating room after undergoing a regional anesthetic.  Patient then underwent a general anesthetic.  After adequate levels anesthesia were obtained patient's left lower extremity was prepped using DuraPrep draped into a sterile field a timeout was called.  A fishmouth incision was made proximal to the necrotic wound edges this was carried sharply down to bone and the distal 2 cm of the tibia and fibula were resected.  Tissue margins were clear 2-0 nylon was used to suture ligate the vessels.  The wound was irrigated with normal saline muscles had good color and contractility.  After further irrigation the deep and superficial fascial layers and skin was closed using 2-0 nylon.  The Prevena customizable and Arnell Sieving form wound VAC was applied this good had a good suction fit this was then covered with the stump shrinker patient was extubated taken the PACU in stable condition   DISCHARGE PLANNING:  Antibiotic duration: Continue antibiotics for 24 hours  Weightbearing: Nonweightbearing  on the left  Pain medication: Opioid pathway  Dressing care/ Wound VAC: Continue wound VAC for 1 week  Ambulatory devices: Walker  Discharge to: Anticipate discharge to home  Follow-up: In the office 1 week post operative.

## 2021-07-07 NOTE — H&P (Signed)
Maxwell Harmon is an 65 y.o. male.   Chief Complaint: Left Below Knee Amputation Wound dehiscence HPI: Patient is a 65 year old gentleman who is status post a left transtibial amputation with peripheral vascular disease venous insufficiency and type 2 diabetes with history of tobacco use.  Patient has had progressive wound dehiscence with black eschar and progressive cellulitis despite oral antibiotics.  Past Medical History:  Diagnosis Date   Anemia of chronic disease    CAD (coronary artery disease)    Diabetes mellitus without complication (HCC)    ESRD (end stage renal disease) on dialysis ([place])    MWF in [place]   History of bleeding ulcers    History of TIAs    Hypertension    NSTEMI (non-ST elevated myocardial infarction) ([place]) 04/2021   PAD (peripheral artery disease) (HCC)    Renal cancer, right ([place])    s/p right radical nephrectomy 01/08/19    Past Surgical History:  Procedure Laterality Date   ABDOMINAL AORTOGRAM W/LOWER EXTREMITY Bilateral 04/14/2021   Procedure: ABDOMINAL AORTOGRAM W/LOWER EXTREMITY;  Surgeon: Elam Dutch, MD;  Location: [facility];  Service: Vascular;  Laterality: Bilateral;   AMPUTATION Left 05/12/2021   Procedure: LEFT BELOW KNEE AMPUTATION;  Surgeon: Newt Minion, MD;  Location: [place];  Service: Orthopedics;  Laterality: Left;   CATARACT EXTRACTION W/PHACO Left 02/15/2014   Procedure: CATARACT EXTRACTION PHACO AND INTRAOCULAR LENS PLACEMENT (IOC);  Surgeon: Tonny Branch, MD;  [facility]: [facility];  Service: Ophthalmology;  Laterality: Left;  CDE 10.84   CATARACT EXTRACTION W/PHACO Right 12/14/2013   Procedure: CATARACT EXTRACTION PHACO AND INTRAOCULAR LENS PLACEMENT (IOC);  Surgeon: Tonny Branch, MD;  [facility]: [facility];  Service: Ophthalmology;  Laterality: Right;  CDE:  16.30   CHOLECYSTECTOMY  02/2013   EYE SURGERY     IR FLUORO GUIDE CV LINE RIGHT  01/05/2019   IR US GUIDE VASC ACCESS RIGHT  01/05/2019   LAPAROSCOPIC NEPHRECTOMY Right 01/08/2019    Procedure: LAPAROSCOPIC RADICAL NEPHRECTOMY;  Surgeon: Raynelle Bring, MD;  [facility];  Service: Urology;  Laterality: Right;   LEFT HEART CATH AND CORS/GRAFTS ANGIOGRAPHY N/A 04/14/2021   Procedure: LEFT HEART CATH AND CORS/GRAFTS ANGIOGRAPHY;  Surgeon: Jolaine Artist, MD;  Location: [facility];  Service: Cardiovascular;  Laterality: N/A;   PENILE PROSTHESIS IMPLANT     PERIPHERAL VASCULAR INTERVENTION Left 04/19/2021   Procedure: PERIPHERAL VASCULAR INTERVENTION;  Surgeon: Cherre Robins, MD;  Location: [facility];  Service: Cardiovascular;  Laterality: Left;   TOE AMPUTATION Right 2013   [facility]    Family History  Problem Relation Age of Onset   Stroke Mother    Stroke Father    Cancer Father    Cancer Brother    Social History:  reports that he has never smoked. He has never used smokeless tobacco. He reports current alcohol use. He reports that he does not use drugs.  Allergies:  Allergies  Allergen Reactions   Ambien [Zolpidem] Other (See Comments)    It causes pt to have insomnia, NOT sleep- idiosyncratic reaction    No medications prior to admission.    No results found for this or any previous visit (from the past 48 hour(s)). No results found.  Review of Systems  All other systems reviewed and are negative.  Height 5\' 11"  (1.803 m), weight 88.5 kg. Physical Exam  Patient is alert, oriented, no adenopathy, well-dressed, normal affect, normal respiratory effort. Examination patient  has progressive wound dehiscence with cellulitis and a large black eschar over the left transtibial amputation.  The area without eschar has fibrinous tissue.  There is no purulent drainage no exposed bone.Heart RRR Lungs Clear Assessment/Plan  Visit Diagnoses:  1. Below-knee amputation of left lower extremity ([place])   2. PVD (peripheral vascular disease) ([place])   3. Dehiscence of amputation stump (HCC)       Plan: Recommended patient  proceed with a revision of the below the knee amputation.  Discussed that we will try to save his knee and patient should be able to ambulate well with a prosthesis with revision amputation.  The surrounding soft tissue cellulitis and necrosis is significant enough that wound care will not be sufficient.  Plan for admission with IV antibiotics and anticipate discharge in 3 days. Bevely Palmer Lavarr President, PA 07/07/2021, 6:33 AM

## 2021-07-07 NOTE — Plan of Care (Signed)

## 2021-07-07 NOTE — Anesthesia Postprocedure Evaluation (Signed)
Anesthesia Post Note  Patient: Maxwell Harmon  Procedure(s) Performed: REVISION LEFT BELOW KNEE AMPUTATION (Left: Leg Lower)     Patient location during evaluation: PACU Anesthesia Type: General Level of consciousness: awake and alert and oriented Pain management: pain level controlled Vital Signs Assessment: post-procedure vital signs reviewed and stable Respiratory status: spontaneous breathing, nonlabored ventilation and respiratory function stable Cardiovascular status: blood pressure returned to baseline Postop Assessment: no apparent nausea or vomiting Anesthetic complications: no   No notable events documented.  Last Vitals:  Vitals:   07/07/21 1202 07/07/21 1217  BP: 112/68 111/70  Pulse: 73 73  Resp: 15 16  Temp:    SpO2: 97% 94%    Last Pain:  Vitals:   07/07/21 1217  TempSrc:   PainSc: 0-No pain                 Marthenia Rolling

## 2021-07-07 NOTE — Progress Notes (Signed)
Inpatient Diabetes Program Recommendations  AACE/ADA: New Consensus Statement on Inpatient Glycemic Control (2015)  Target Ranges:  Prepandial:   less than 140 mg/dL      Peak postprandial:   less than 180 mg/dL (1-2 hours)      Critically ill patients:  140 - 180 mg/dL   Lab Results  Component Value Date   GLUCAP 121 (H) 07/07/2021   HGBA1C 5.3 06/16/2021    Review of Glycemic Control  Diabetes history: type 2 Outpatient Diabetes medications: Basaglar 5-10 units sliding scale daily Current orders for Inpatient glycemic control: Novolog SENSITIVE correction scale TID  Inpatient Diabetes Program Recommendations:   Received diabetes coordinator consult for glycemic control. Has received Decadron 10 mg in surgery.   Recommend starting Semglee 5 units daily, Novolog SENSITIVE correction scale every 4 hours for about 24 hours, then TID & HS scale (0-5 units).   Will continue to monitor blood sugars while in the hospital.  Harvel Ricks RN BSN CDE Diabetes Coordinator Pager: [phone]  8am-5pm

## 2021-07-07 NOTE — Transfer of Care (Signed)
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  Last Pain:  Vitals:   07/07/21 0943  TempSrc:   PainSc: 0-No pain         Complications: No notable events documented.

## 2021-07-07 NOTE — Progress Notes (Signed)
Orthopedic Tech Progress Note Patient Details:  ERIVERTO BYRNES [birth year] [id number] Called order into hanger Patient ID: [name], male   DOB: [birth year], 65 y.o.   MRN: [id number]  [name] 07/07/2021, 1:56 PM

## 2021-07-07 NOTE — Progress Notes (Addendum)
Pharmacy Antibiotic Note  Maxwell Harmon is a 65 y.o. male admitted on 07/07/2021 with  wound infection .  Pharmacy has been consulted for cefepime dosing. He is now s/p left BKA revision. He also has ESRD and usual HD days are MWF. Afeb, no CBC this admit, and no new cultures.  Plan: Cefepime 1 g IV q24h F/U HD schedule Monitor renal function, clinical progress, cultures/sensitivities F/U LOT and de-escalate as able   Height: 5\' 11"  (180.3 cm) Weight: 88.5 kg (195 lb 1.7 oz) IBW/kg (Calculated) : 75.3  Temp (24hrs), Avg:97.8 F (36.6 C), Min:97.7 F (36.5 C), Max:97.9 F (36.6 C)  Recent Labs  Lab 07/07/21 0947  CREATININE 4.80*    Estimated Creatinine Clearance: 16.6 mL/min (A) (by C-G formula based on SCr of 4.8 mg/dL (H)).    Allergies  Allergen Reactions   Ambien [Zolpidem] Other (See Comments)    It causes pt to have insomnia, NOT sleep- idiosyncratic reaction    Thank you for involving pharmacy in this patient's care.  Renold Genta, PharmD, BCPS Clinical Pharmacist Clinical phone for 07/07/2021 until 3p is [phone] 07/07/2021 1:16 PM  **Pharmacist phone directory can be found on [email or web address] listed under [place]**  07/07/21: Antibiotics stopped 24 hours post amputation -- one time dose of cefazolin in the morning.  Thank you Anette Guarneri, PharmD

## 2021-07-07 NOTE — Anesthesia Procedure Notes (Signed)
Anesthesia Regional Block: Popliteal block   Pre-Anesthetic Checklist: , timeout performed,  Correct Patient, Correct Site, Correct Laterality,  Correct Procedure, Correct Position, site marked,  Risks and benefits discussed,  Pre-op evaluation,  At surgeon's request and post-op pain management  Laterality: Left  Prep: Maximum Sterile Barrier Precautions used, chloraprep       Needles:  Injection technique: Single-shot  Needle Type: Echogenic Stimulator Needle     Needle Length: 9cm  Needle Gauge: 22     Additional Needles:   Procedures:,,,, ultrasound used (permanent image in chart),,    Narrative:  Start time: 07/07/2021 10:25 AM End time: 07/07/2021 10:28 AM Injection made incrementally with aspirations every 5 mL.  Performed by: Personally  Anesthesiologist: Brennan Bailey, MD  Additional Notes: Risks, benefits, and alternative discussed. Patient gave consent for procedure. Patient prepped and draped in sterile fashion. Sedation administered, patient remains easily responsive to voice. Relevant anatomy identified with ultrasound guidance. Local anesthetic given in 5cc increments with no signs or symptoms of intravascular injection. No pain or paraesthesias with injection. Patient monitored throughout procedure with signs of LAST or immediate complications. Tolerated well. Ultrasound image placed in chart.  Tawny Asal, MD

## 2021-07-08 ENCOUNTER — Encounter (HOSPITAL_COMMUNITY): Payer: Self-pay | Admitting: Orthopedic Surgery

## 2021-07-08 LAB — BASIC METABOLIC PANEL
Anion gap: 11 (ref 5–15)
BUN: 53 mg/dL — ABNORMAL HIGH (ref 8–23)
CO2: 27 mmol/L (ref 22–32)
Calcium: 8.5 mg/dL — ABNORMAL LOW (ref 8.9–10.3)
Chloride: 98 mmol/L (ref 98–111)
Creatinine, Ser: 5.46 mg/dL — ABNORMAL HIGH (ref 0.61–1.24)
GFR, Estimated: 11 mL/min — ABNORMAL LOW (ref >60)
Glucose, Bld: 208 mg/dL — ABNORMAL HIGH (ref 70–99)
Potassium: 5.8 mmol/L — ABNORMAL HIGH (ref 3.5–5.1)
Sodium: 136 mmol/L (ref 135–145)

## 2021-07-08 LAB — CBC
HCT: 32.7 % — ABNORMAL LOW (ref 39.0–52.0)
Hemoglobin: 10.1 g/dL — ABNORMAL LOW (ref 13.0–17.0)
MCH: 32 pg (ref 26.0–34.0)
MCHC: 30.9 g/dL (ref 30.0–36.0)
MCV: 103.5 fL — ABNORMAL HIGH (ref 80.0–100.0)
Platelets: 136 10*3/uL — ABNORMAL LOW (ref 150–400)
RBC: 3.16 MIL/uL — ABNORMAL LOW (ref 4.22–5.81)
RDW: 16.4 % — ABNORMAL HIGH (ref 11.5–15.5)
WBC: 6.5 10*3/uL (ref 4.0–10.5)
nRBC: 0 % (ref 0.0–0.2)

## 2021-07-08 LAB — HEPATITIS B SURFACE ANTIGEN: Hepatitis B Surface Ag: NONREACTIVE

## 2021-07-08 LAB — GLUCOSE, CAPILLARY: Glucose-Capillary: 90 mg/dL (ref 70–99)

## 2021-07-08 MED ORDER — OXYCODONE-ACETAMINOPHEN 5-325 MG PO TABS: 1.0000 | ORAL_TABLET | ORAL | 0 refills | Status: DC | PRN

## 2021-07-08 NOTE — Progress Notes (Signed)
  [facility] Progress Note   Subjective:  Seen on HD - 1.5L UFG and tolerating. Denies CP or dyspnea, although using nasal O2. No L leg pain at this time.  Objective Vitals:   07/08/21 0404 07/08/21 0740 07/08/21 0744 07/08/21 0800  BP: 110/67 109/77 110/67 117/69  Pulse: 77 78 78 78  Resp: 16 17    Temp: 98.4 F (36.9 C) 97.6 F (36.4 C)    TempSrc: Oral Oral    SpO2: 100% 98%    Weight:  88.7 kg    Height:       Physical Exam General: Well appearing man, NAD. Nasal O2 in place Heart: RRR; no murmur Lungs: CTA anteriorly Abdomen: soft Extremities: No RLE edema; L stump in hard brace with wound vac Dialysis Access: LUE AVF + thrill  Additional Objective Labs: Basic Metabolic Panel: Recent Labs  Lab 07/07/21 0947 07/08/21 0131  NA 136 136  K 4.6 5.8*  CL 97* 98  CO2  --  27  GLUCOSE 116* 208*  BUN 39* 53*  CREATININE 4.80* 5.46*  CALCIUM  --  8.5*   CBC: Recent Labs  Lab 07/07/21 0947 07/08/21 0131  WBC  --  6.5  HGB 12.6* 10.1*  HCT 37.0* 32.7*  MCV  --  103.5*  PLT  --  136*   Medications:  sodium chloride      ceFAZolin (ANCEF) IV      aspirin EC  81 mg Oral Daily   atorvastatin  80 mg Oral Daily   [START ON 07/10/2021] cinacalcet  30 mg Oral Q Mon-1800   clonazePAM  0.5 mg Oral QHS   clopidogrel  75 mg Oral Daily   docusate sodium  100 mg Oral Daily   ferric citrate  210 mg Oral TID WC   furosemide  80 mg Oral BID   gabapentin  100 mg Oral QHS   insulin aspart  0-9 Units Subcutaneous Q4H   insulin glargine-yfgn  5 Units Subcutaneous QHS   [START ON 07/10/2021] midodrine  5 mg Oral Q M,W,F-HD   multivitamin  1 tablet Oral QHS   nutrition supplement (JUVEN)  1 packet Oral BID BM   pantoprazole  40 mg Oral Daily   zinc sulfate  220 mg Oral Daily    Dialysis Orders: MWF at [facility] 3:15, 450/A1.5, EDW 86.5kg, 2K/2.5Ca, AVF, heparin 2000 - Mircera 100 q 2 weeks - Venofer 100mg  x 10 HBsAg negative on 06/12/21    Assessment/Plan:  L BKA dehiscence s/p revision/debridement and wound vac 07/07/21: Per ortho.  ESRD: Usual MWF schedule - HD this morning, 1.5L UFG. No heparin, 2K.  Hypertension/volume: BP stable, no edema on exam.  Anemia: Hgb 12.6, no ESA.  Metabolic bone disease: Ca ok continue home binders Lorin Picket).  T2DM  Veneta Penton, Hershal Coria 07/08/2021, 8:36 AM  [employer]

## 2021-07-08 NOTE — Evaluation (Signed)
Occupational Therapy Evaluation Patient Details Name: Maxwell Harmon MRN: [id number] DOB: December 17, 1955 Today's Date: 07/08/2021    History of Present Illness Pt is a 65 y.o. male admitted 9/1 to [facility] for I & D of L BKA.  PMH: L BKA (7/8), CHF, ESRD (switched from PD to HB 04/2021), hypertension, CAD s/p CABG 2021, PAD h/o limb ischemia, Charcot foot, hyperlipidemia, diabetes, TIA.   Clinical Impression   Pt admitted for procedure listed above. PTA pt reported that he was independent with all ADL's except for LB dressing and bathing, and used a WC to self propel around the house. Pt requiring supervision to min guard for all ADL's and functional mobility, due to increased weakness and decreased balance. OT will continue to follow up with pt acutely.    Follow Up Recommendations  Home health OT;Supervision - Intermittent    Equipment Recommendations  None recommended by OT    Recommendations for Other Services       Precautions / Restrictions Precautions Precautions: Fall Restrictions Weight Bearing Restrictions: Yes LLE Weight Bearing: Non weight bearing      Mobility Bed Mobility Overal bed mobility: Modified Independent             General bed mobility comments: Increased time    Transfers Overall transfer level: Needs assistance Equipment used: Rolling walker (2 wheeled) Transfers: Sit to/from [facility] Sit to Stand: Min guard Stand pivot transfers: Min guard            Balance Overall balance assessment: Needs assistance Sitting-balance support: Single extremity supported Sitting balance-Leahy Scale: Fair     Standing balance support: Bilateral upper extremity supported Standing balance-Leahy Scale: Fair                             ADL either performed or assessed with clinical judgement   ADL Overall ADL's : Needs assistance/impaired Eating/Feeding: Independent;Sitting   Grooming: Set up;Sitting   Upper Body Bathing:  Set up;Sitting   Lower Body Bathing: Minimal assistance;Sitting/lateral leans;Sit to/from stand   Upper Body Dressing : Set up;Sitting   Lower Body Dressing: Minimal assistance;Sitting/lateral leans;Sit to/from stand   Toilet Transfer: Min guard;Stand-pivot   Toileting- Water quality scientist and Hygiene: Min guard;Sitting/lateral lean   Tub/ Shower Transfer: Min guard;Stand-pivot   Functional mobility during ADLs: Min guard General ADL Comments: Pt limited by increased weaknessrequiring set up to min A for all ADL's.     Vision Baseline Vision/History: 1 Wears glasses Ability to See in Adequate Light: 0 Adequate Patient Visual Report: No change from baseline Vision Assessment?: No apparent visual deficits     Perception Perception Perception Tested?: No   Praxis Praxis Praxis tested?: Not tested    Pertinent Vitals/Pain Pain Assessment: No/denies pain     Hand Dominance Right   Extremity/Trunk Assessment Upper Extremity Assessment Upper Extremity Assessment: Overall WFL for tasks assessed   Lower Extremity Assessment Lower Extremity Assessment: Defer to PT evaluation   Cervical / Trunk Assessment Cervical / Trunk Assessment: Normal   Communication Communication Communication: No difficulties   Cognition Arousal/Alertness: Awake/alert Behavior During Therapy: WFL for tasks assessed/performed Overall Cognitive Status: Within Functional Limits for tasks assessed                                     General Comments  VSS on RA    Exercises  Shoulder Instructions      Home Living Family/patient expects to be discharged to:: Private residence Living Arrangements: Spouse/significant other Available Help at Discharge: Family;Available PRN/intermittently Type of Home: House Home Access: Stairs to enter     Home Layout: One level     Bathroom Shower/Tub: Occupational psychologist: Standard Bathroom Accessibility: Yes   Home  Equipment: Shower seat;Cane - single point;Walker - 2 wheels;Wheelchair - manual          Prior Functioning/Environment Level of Independence: Independent with assistive device(s)        Comments: Ambulating household with WC, able to transfer with rW        OT Problem List: Decreased strength;Decreased activity tolerance      OT Treatment/Interventions: Self-care/ADL training;Therapeutic exercise;Energy conservation;DME and/or AE instruction;Therapeutic activities;Patient/family education;Balance training    OT Goals(Current goals can be found in the care plan section) Acute Rehab OT Goals Patient Stated Goal: To go home OT Goal Formulation: With patient Time For Goal Achievement: 07/22/21 Potential to Achieve Goals: Good ADL Goals Pt Will Perform Grooming: with modified independence;standing Pt Will Perform Lower Body Bathing: with min guard assist;sit to/from stand Pt Will Perform Lower Body Dressing: with min guard assist;sit to/from stand Pt Will Transfer to Toilet: with modified independence;stand pivot transfer Pt Will Perform Toileting - Clothing Manipulation and hygiene: with modified independence;sitting/lateral leans  OT Frequency: Min 2X/week   Barriers to D/C:            Co-evaluation              AM-PAC OT "6 Clicks" Daily Activity     Outcome Measure Help from another person eating meals?: None Help from another person taking care of personal grooming?: A Little Help from another person toileting, which includes using toliet, bedpan, or urinal?: A Little Help from another person bathing (including washing, rinsing, drying)?: A Little Help from another person to put on and taking off regular upper body clothing?: A Little Help from another person to put on and taking off regular lower body clothing?: A Little 6 Click Score: 19   End of Session Equipment Utilized During Treatment: Rolling walker Nurse Communication: Mobility status  Activity  Tolerance: Patient tolerated treatment well Patient left: in bed;with call bell/phone within reach;with family/visitor present  OT Visit Diagnosis: Unsteadiness on feet (R26.81);Repeated falls (R29.6);Muscle weakness (generalized) (M62.81)                Time: 5809-9833 OT Time Calculation (min): 23 min Charges:  OT General Charges $OT Visit: 1 Visit OT Evaluation $OT Eval Moderate Complexity: 1 Mod OT Treatments $Self Care/Home Management : 8-22 mins  Gracelyn Coventry H., OTR/L Acute Rehabilitation  Nyeli Holtmeyer Elane Yolanda Bonine 07/08/2021, 2:39 PM

## 2021-07-08 NOTE — Evaluation (Signed)
Physical Therapy Evaluation Patient Details Name: Maxwell Harmon MRN: [id number] DOB: 08/30/1956 Today's Date: 07/08/2021   History of Present Illness  Pt is a 65 y.o. male admitted 9/1 to [facility] for I & D of L BKA.  PMH: L BKA (7/8), CHF, ESRD (switched from PD to HB 04/2021), hypertension, CAD s/p CABG 2021, PAD h/o limb ischemia, Charcot foot, hyperlipidemia, diabetes, TIA.  Clinical Impression  Patient is s/p above surgery resulting in functional limitations due to the deficits listed below (see PT Problem List). Required min assist to stand pivot transfer. Pt states he is comfortable performing scoot/slideboard transfers at home until he works more with HHPT again.  Patient will benefit from skilled PT to increase their independence and safety with mobility to allow discharge to the venue listed below.       Follow Up Recommendations Home health PT;Supervision for mobility/OOB    Equipment Recommendations  Other (comment) (Requesting Ultra-Light wheelchair for easier mobility.)    Recommendations for Other Services       Precautions / Restrictions Precautions Precautions: Fall Restrictions Weight Bearing Restrictions: Yes LLE Weight Bearing: Non weight bearing      Mobility  Bed Mobility Overal bed mobility: Modified Independent             General bed mobility comments: Increased time    Transfers Overall transfer level: Needs assistance Equipment used: Rolling walker (2 wheeled) Transfers: Sit to/from [facility] Sit to Stand: Min guard Stand pivot transfers: Min assist       General transfer comment: Min guard from slightly elevated bed surface similar to home bed. Cues for technique. min assist with pivot transfer towards left due to posterior LOB, able to correct with light support from therapist. Good UE strength to pivot.  Ambulation/Gait                Stairs            Wheelchair Mobility    Modified Rankin (Stroke  Patients Only)       Balance Overall balance assessment: Needs assistance Sitting-balance support: No upper extremity supported;Feet supported Sitting balance-Leahy Scale: Fair     Standing balance support: Bilateral upper extremity supported Standing balance-Leahy Scale: Poor                               Pertinent Vitals/Pain Pain Assessment: 0-10 Pain Score: 2  Pain Location: LLE Pain Descriptors / Indicators: Aching    Home Living Family/patient expects to be discharged to:: Private residence Living Arrangements: Spouse/significant other Available Help at Discharge: Family;Available PRN/intermittently Type of Home: House Home Access: Ramped entrance (States he has a ramp)     Home Layout: One level Home Equipment: Shower seat;Cane - single point;Walker - 2 wheels;Wheelchair - manual Additional Comments: States his w/c is heavy and looking into lighter w/c for mobility.    Prior Function Level of Independence: Independent with assistive device(s)         Comments: Not walking with RW. Able to stand pivot transfer PTA. Using W/c for mobility at home.     Hand Dominance   Dominant Hand: Right    Extremity/Trunk Assessment   Upper Extremity Assessment Upper Extremity Assessment: Defer to OT evaluation    Lower Extremity Assessment Lower Extremity Assessment: LLE deficits/detail;Generalized weakness LLE Deficits / Details: Limited assess, NWB residual limb with shrinker sock    Cervical / Trunk Assessment Cervical / Trunk Assessment:  Normal  Communication   Communication: No difficulties  Cognition Arousal/Alertness: Awake/alert Behavior During Therapy: WFL for tasks assessed/performed Overall Cognitive Status: Within Functional Limits for tasks assessed                                        General Comments General comments (skin integrity, edema, etc.): SpO2 94% on room air.    Exercises Amputee Exercises Quad Sets:  Strengthening;Left;5 reps;Supine   Assessment/Plan    PT Assessment Patient needs continued PT services  PT Problem List Decreased strength;Decreased activity tolerance;Decreased balance;Decreased mobility;Pain       PT Treatment Interventions DME instruction;Gait training;Functional mobility training;Therapeutic activities;Therapeutic exercise;Balance training;Neuromuscular re-education;Patient/family education    PT Goals (Current goals can be found in the Care Plan section)  Acute Rehab PT Goals Patient Stated Goal: To go home PT Goal Formulation: With patient Time For Goal Achievement: 07/22/21 Potential to Achieve Goals: Good    Frequency Min 3X/week   Barriers to discharge        Co-evaluation               AM-PAC PT "6 Clicks" Mobility  Outcome Measure Help needed turning from your back to your side while in a flat bed without using bedrails?: None Help needed moving from lying on your back to sitting on the side of a flat bed without using bedrails?: None Help needed moving to and from a bed to a chair (including a wheelchair)?: A Little Help needed standing up from a chair using your arms (e.g., wheelchair or bedside chair)?: None Help needed to walk in hospital room?: A Lot Help needed climbing 3-5 steps with a railing? : Total 6 Click Score: 18    End of Session Equipment Utilized During Treatment: Gait belt Activity Tolerance: Patient tolerated treatment well Patient left: in chair;with call bell/phone within reach;with chair alarm set;with family/visitor present;with SCD's reapplied   PT Visit Diagnosis: Muscle weakness (generalized) (M62.81);Difficulty in walking, not elsewhere classified (R26.2);Pain Pain - Right/Left: Left Pain - part of body: Leg    Time: 4742-5956 PT Time Calculation (min) (ACUTE ONLY): 26 min   Charges:   PT Evaluation $PT Eval Low Complexity: 1 Low PT Treatments $Therapeutic Activity: 8-22 mins        Candie Mile,  PT, DPT  Ellouise Newer 07/08/2021, 3:40 PM

## 2021-07-08 NOTE — Discharge Summary (Signed)
Discharge Diagnoses:  Active Problems:   Hx of BKA, left (HCC)   Dehiscence of amputation stump (HCC)   Wound dehiscence   Surgeries: Procedure(s): REVISION LEFT BELOW KNEE AMPUTATION on 07/07/2021    Consultants:   Discharged Condition: Improved  Hospital Course: Maxwell Harmon is an 65 y.o. male who was admitted 07/07/2021 with a chief complaint of dehiscence left below knee amputation, with a final diagnosis of Dehiscence Left Below Knee Amputation.  Patient was brought to the operating room on 07/07/2021 and underwent Procedure(s): REVISION LEFT BELOW KNEE AMPUTATION.    Patient was given perioperative antibiotics:  Anti-infectives (From admission, onward)    Start     Dose/Rate Route Frequency Ordered Stop   07/08/21 1000  ceFAZolin (ANCEF) IVPB 2g/100 mL premix        2 g 200 mL/hr over 30 Minutes Intravenous  Once 07/07/21 1337     07/07/21 1800  ceFEPIme (MAXIPIME) 1 g in sodium chloride 0.9 % 100 mL IVPB  Status:  Discontinued        1 g 200 mL/hr over 30 Minutes Intravenous Every 24 hours 07/07/21 1322 07/07/21 1346   07/07/21 0915  ceFAZolin (ANCEF) IVPB 2g/100 mL premix        2 g 200 mL/hr over 30 Minutes Intravenous To ShortStay Surgical 07/07/21 0857 07/07/21 1100     .  Patient was given sequential compression devices, early ambulation, and aspirin for DVT prophylaxis.  Recent vital signs: Patient Vitals for the past 24 hrs:  BP Temp Temp src Pulse Resp SpO2 Weight  07/08/21 1050 105/60 98 F (36.7 C) Oral 76 18 95 % --  07/08/21 1000 110/63 -- -- 74 -- -- --  07/08/21 0947 100/60 -- -- 75 -- -- --  07/08/21 0930 (!) 88/54 -- -- 73 -- -- --  07/08/21 0900 102/60 -- -- 74 -- -- --  07/08/21 0830 (!) 102/59 -- -- 75 -- -- --  07/08/21 0800 117/69 -- -- 78 -- -- --  07/08/21 0744 110/67 -- -- 78 -- -- --  07/08/21 0740 109/77 97.6 F (36.4 C) Oral 78 17 98 % 88.7 kg  07/08/21 0404 110/67 98.4 F (36.9 C) Oral 77 16 100 % --  07/07/21 1948 116/65 98.1 F  (36.7 C) Oral 84 16 96 % --  07/07/21 1607 127/71 (!) 97.5 F (36.4 C) Oral 80 16 92 % --  07/07/21 1340 105/69 -- -- 73 16 90 % --  07/07/21 1217 111/70 -- -- 73 16 94 % --  07/07/21 1202 112/68 -- -- 73 15 97 % --  07/07/21 1148 109/68 -- -- -- -- -- --  07/07/21 1147 100/63 97.7 F (36.5 C) -- 74 19 94 % --  .  Recent laboratory studies: No results found.  Discharge Medications:   Allergies as of 07/08/2021       Reactions   Ambien [zolpidem] Other (See Comments)   It causes pt to have insomnia, NOT sleep- idiosyncratic reaction        Medication List     TAKE these medications    acetaminophen 325 MG tablet Commonly known as: TYLENOL Take 1-2 tablets (325-650 mg total) by mouth every 4 (four) hours as needed for mild pain.   Aspirin Low Dose 81 MG EC tablet Generic drug: aspirin Take 1 tablet (81 mg total) by mouth daily.   atorvastatin 80 MG tablet Commonly known as: LIPITOR Take 1 tablet (80 mg total) by mouth daily. What  changed: when to take this   Auryxia 1 GM 210 MG(Fe) tablet Generic drug: ferric citrate Take 210-420 mg by mouth See admin instructions. Take 1 tablet by mouth with each home meal, and if eating out take 2 tablet with that meal   Basaglar KwikPen 100 UNIT/ML Inject 5 Units into the skin at bedtime. What changed:  how much to take additional instructions   Bayer Contour Monitor w/Device Kit 1 each by Does not apply route 4 (four) times daily. Test 4 x daily   bethanechol 25 MG tablet Commonly known as: URECHOLINE Take 1 tablet (25 mg total) by mouth 3 (three) times daily.   blood glucose meter kit and supplies Kit Dispense based on patient and insurance preference. Use up to four times daily as directed. (FOR ICD-9 250.00, 250.01).   calcitRIOL 0.25 MCG capsule Commonly known as: ROCALTROL Take 1 capsule (0.25 mcg total) by mouth 2 (two) times daily.   cefdinir 300 MG capsule Commonly known as: OMNICEF Take 300 mg by mouth  every 12 (twelve) hours.   cinacalcet 30 MG tablet Commonly known as: SENSIPAR Take 1 tablet (30 mg total) by mouth once a week. Take on Mondays What changed:  when to take this additional instructions   clonazePAM 0.5 MG tablet Commonly known as: KLONOPIN Take 0.5 mg by mouth at bedtime.   clonazePAM 0.25 MG disintegrating tablet Commonly known as: KLONOPIN Take 1 tablet (0.25 mg total) by mouth 2 (two) times daily as needed (Anxiety).   clopidogrel 75 MG tablet Commonly known as: PLAVIX Take 1 tablet (75 mg total) by mouth daily. What changed: when to take this   doxycycline 100 MG tablet Commonly known as: VIBRA-TABS Take 1 tablet (100 mg total) by mouth 2 (two) times daily. What changed: additional instructions   furosemide 80 MG tablet Commonly known as: LASIX Take 1 tablet (80 mg total) by mouth 2 (two) times daily.   gabapentin 100 MG capsule Commonly known as: NEURONTIN Take 100 mg by mouth at bedtime.   gabapentin 300 MG capsule Commonly known as: NEURONTIN Take 1 capsule (300 mg total) by mouth at bedtime.   gentamicin cream 0.1 % Commonly known as: GARAMYCIN Apply topically daily. What changed:  how much to take when to take this   glucose blood test strip Commonly known as: Estate manager/land agent Use as instructed 4 x daily   Insulin Pen Needle 31G X 8 MM Misc Commonly known as: B-D ULTRAFINE III SHORT PEN 1 each by Does not apply route as directed.   Lancets Misc 1 each by Does not apply route 4 (four) times daily.   linaclotide 290 MCG Caps capsule Commonly known as: LINZESS Take 1 capsule (290 mcg total) by mouth daily before breakfast.   metaxalone 400 MG tablet Commonly known as: SKELAXIN Take 1 tablet (400 mg total) by mouth 3 (three) times daily as needed for muscle spasms (please offer- pt forgets to ask).   midodrine 10 MG tablet Commonly known as: PROAMATINE Take 0.5 tablets (5 mg total) by mouth every Monday, Wednesday, and Friday  with hemodialysis.   multivitamin Tabs tablet Take 1 tablet by mouth at bedtime.   oxyCODONE-acetaminophen 5-325 MG tablet Commonly known as: PERCOCET/ROXICET Take 1 tablet by mouth every 4 (four) hours as needed. What changed:  when to take this reasons to take this   pantoprazole 40 MG tablet Commonly known as: PROTONIX Take 1 tablet (40 mg total) by mouth daily.   pentoxifylline 400 MG  CR tablet Commonly known as: TRENTAL Take 1 tablet (400 mg total) by mouth 3 (three) times daily with meals.   polyethylene glycol 17 g packet Commonly known as: MIRALAX / GLYCOLAX Take 17 g by mouth daily as needed for mild constipation.   QUEtiapine 50 MG tablet Commonly known as: SEROQUEL TAKE 1 TABLET BY MOUTH EVERYDAY AT BEDTIME   senna-docusate 8.6-50 MG tablet Commonly known as: Senokot-S Take 2 tablets by mouth 2 (two) times daily.   tamsulosin 0.4 MG Caps capsule Commonly known as: FLOMAX Take 2 capsules (0.8 mg total) by mouth at bedtime.   traMADol 50 MG tablet Commonly known as: ULTRAM Take 1 tablet (50 mg total) by mouth every 6 (six) hours as needed for moderate pain.   venlafaxine XR 37.5 MG 24 hr capsule Commonly known as: EFFEXOR-XR Take 37.5 mg by mouth daily.   zinc sulfate 220 (50 Zn) MG capsule Take 1 capsule (220 mg total) by mouth daily.        Diagnostic Studies: DG Chest Port 1 View  Result Date: 06/15/2021 CLINICAL DATA:  Weakness EXAM: PORTABLE CHEST 1 VIEW COMPARISON:  05/20/2021 FINDINGS: Previous median sternotomy. Chronic cardiomegaly. Enlarging bilateral effusions with lower lobe atelectasis and or pneumonia. Upper lungs remain largely clear. IMPRESSION: Enlarging effusions and worsening lower lobe atelectasis and or pneumonia. Electronically Signed   By: Nelson Chimes M.D.   On: 06/15/2021 20:16    Patient benefited maximally from their hospital stay and there were no complications.     Disposition: Discharge disposition: 01-Home or Self  Care      Discharge Instructions     Call MD / Call 911   Complete by: As directed    If you experience chest pain or shortness of breath, CALL 911 and be transported to the hospital emergency room.  If you develope a fever above 101 F, pus (white drainage) or increased drainage or redness at the wound, or calf pain, call your surgeon's office.   Constipation Prevention   Complete by: As directed    Drink plenty of fluids.  Prune juice may be helpful.  You may use a stool softener, such as Colace (over the counter) 100 mg twice a day.  Use MiraLax (over the counter) for constipation as needed.   Diet - low sodium heart healthy   Complete by: As directed    Increase activity slowly as tolerated   Complete by: As directed    Negative Pressure Wound Therapy - Incisional   Complete by: As directed    Show patient how to attach preveena pump. Should call office if alarms   Negative Pressure Wound Therapy - Incisional   Complete by: As directed    Post-operative opioid taper instructions:   Complete by: As directed    POST-OPERATIVE OPIOID TAPER INSTRUCTIONS: It is important to wean off of your opioid medication as soon as possible. If you do not need pain medication after your surgery it is ok to stop day one. Opioids include: Codeine, Hydrocodone(Norco, Vicodin), Oxycodone(Percocet, oxycontin) and hydromorphone amongst others.  Long term and even short term use of opiods can cause: Increased pain response Dependence Constipation Depression Respiratory depression And more.  Withdrawal symptoms can include Flu like symptoms Nausea, vomiting And more Techniques to manage these symptoms Hydrate well Eat regular healthy meals Stay active Use relaxation techniques(deep breathing, meditating, yoga) Do Not substitute Alcohol to help with tapering If you have been on opioids for less than two weeks and do not  have pain than it is ok to stop all together.  Plan to wean off of  opioids This plan should start within one week post op of your joint replacement. Maintain the same interval or time between taking each dose and first decrease the dose.  Cut the total daily intake of opioids by one tablet each day Next start to increase the time between doses. The last dose that should be eliminated is the evening dose.          Follow-up Information     Suzan Slick, NP Follow up in 1 week(s).   Specialty: Orthopedic Surgery Contact information: [address] [place] [address] [phone]                  Signed: Newt Minion 07/08/2021, 11:28 AM

## 2021-07-08 NOTE — TOC Transition Note (Signed)
Transition of Care [facility]) - CM/SW Discharge Note   Patient Details  Name: Maxwell Harmon MRN: [id number] Date of Birth: [birth year]  Transition of Care [facility]) CM/SW Contact:  Bartholomew Crews, RN Phone Number: [phone] 07/08/2021, 4:19 PM   Clinical Narrative:     Notified by nursing that patient is active with [facility]. Notified by liaison at [facility] of patient transitioning home today. Patient is active for nursing and PT with [facility] office. HH orders place for resumption of services.   Final next level of care: Home w Home Health Services Barriers to Discharge: No Barriers Identified   Patient Goals and CMS Choice Patient states their goals for this hospitalization and ongoing recovery are:: return home with wife CMS Medicare.gov Compare Post Acute Care list provided to:: Patient Choice offered to / list presented to : Patient  Discharge Placement                       Discharge Plan and Services                DME Arranged: N/A DME Agency: NA       HH Arranged: RN, PT [place] Agency: [place] Date [facility] Agency Contacted: 07/08/21 Time [place]: 1619 Representative spoke with at [place]: [place] ([place]) Interventions     Readmission Risk Interventions Readmission Risk Prevention Plan 04/20/2021  Transportation Screening Complete  PCP or Specialist Appt within 3-5 Days Complete  HRI or [place] Complete  Social Work Consult for [place] Planning/Counseling Complete  Palliative Care Screening Not Applicable  Medication Review Press photographer) Complete  Some recent data might be hidden

## 2021-07-08 NOTE — Progress Notes (Signed)
Physical Therapy Note  New order acknowledged. Noted in chart pt currently at dialysis [facility] - Will follow-up later today for comprehensive physical therapy evaluation.   Candie Mile, PT

## 2021-07-08 NOTE — Progress Notes (Signed)
   07/08/21 1050  Vitals  Temp 98 F (36.7 C)  Temp Source Oral  BP 105/60  BP Location Right Arm  BP Method Automatic  Patient Position (if appropriate) Lying  Pulse Rate 76  Pulse Rate Source Monitor  Resp 18  Oxygen Therapy  SpO2 95 %  O2 Device Nasal Cannula  O2 Flow Rate (L/min) 1 L/min  Post-Hemodialysis Assessment  Rinseback Volume (mL) 250 mL  KECN 282 V  Dialyzer Clearance Lightly streaked  Duration of HD Treatment -hour(s) 3 hour(s)  Hemodialysis Intake (mL) 500 mL  UF Total -Machine (mL) 1931 mL  Net UF (mL) 1431 mL  Tolerated HD Treatment Yes  Post-Hemodialysis Comments tx complete-pt stable  AVG/AVF Arterial Site Held (minutes) 10 minutes  AVG/AVF Venous Site Held (minutes) 10 minutes  Fistula / Graft Left Upper arm Arteriovenous fistula  No placement date or time found.   Placed prior to admission: Yes  Orientation: Left  Access Location: Upper arm  Access Type: Arteriovenous fistula  Site Condition No complications  Fistula / Graft Assessment Present;Thrill;Bruit  Status Deaccessed  HD tx complete, UF stopped intermittently due to hypotension, SBP drop in 80s. UF paused and pt recovered without difficulty. Pt asymptomatic throughout tx. Pt stable upon transfer.

## 2021-07-11 LAB — GLUCOSE, CAPILLARY
Glucose-Capillary: 196 mg/dL — ABNORMAL HIGH (ref 70–99)
Glucose-Capillary: 180 mg/dL — ABNORMAL HIGH (ref 70–99)

## 2021-07-12 ENCOUNTER — Telehealth: Payer: Self-pay | Admitting: Internal Medicine

## 2021-07-12 NOTE — Telephone Encounter (Signed)
I spoke with Maxwell Harmon and mentioned that he was in the hemodialysis unit on 9/3 at same time as another patient that should have been on isolation for hepatitis B. His risk of exposure was very minimal given that he had different nurse and location from index patient. With our goals for patient safety, we are disclosing to all patients that had been dialyzed at the same time frame and recommending he get hepatitis B vaccine series, since his labs on (6/022) does not appear that he is immune to hep B.  He does recall receiving the hep B series many years ago. I have mentioned that I will contact his hemodialysis unit in [place] to give the hepatitis B series, plus we will do monthly hep B DNA testing to ensure there is no conversion to active disease.   He will be receiving a letter in the mail explaining our plan and if there are any further questions he may have that he can contact us for further discussion.  Maxwell Harmon for Infectious Diseases [phone]

## 2021-07-13 ENCOUNTER — Encounter: Payer: Managed Care, Other (non HMO) | Attending: Registered Nurse | Admitting: Registered Nurse

## 2021-07-13 ENCOUNTER — Ambulatory Visit (INDEPENDENT_AMBULATORY_CARE_PROVIDER_SITE_OTHER): Payer: Managed Care, Other (non HMO) | Admitting: Physician Assistant

## 2021-07-13 ENCOUNTER — Encounter: Payer: Self-pay | Admitting: Physician Assistant

## 2021-07-13 ENCOUNTER — Other Ambulatory Visit: Payer: Self-pay

## 2021-07-13 DIAGNOSIS — S88112A Complete traumatic amputation at level between knee and ankle, left lower leg, initial encounter: Secondary | ICD-10-CM

## 2021-07-13 DIAGNOSIS — Z89512 Acquired absence of left leg below knee: Secondary | ICD-10-CM

## 2021-07-13 NOTE — Progress Notes (Signed)
Office Visit Note   Patient: Maxwell Harmon           Date of Birth: [birth year]           MRN: [id number] Visit Date: 07/13/2021              Requested by: [name], [place] [address] PCP: [name], DO  Chief Complaint  Patient presents with   Left Leg - Routine Post Op    07/07/21 revision left BKA       HPI: Patient is 5 days status post below-knee amputation revision.  He is doing well.  Not requiring any pain medication refills today  Assessment & Plan: Visit Diagnoses: No diagnosis found.  Plan: Patient will begin daily Dial cleansing.  They will start using the shrinker in a day or 2 and should put a clean shrinker on daily after showering.  Can apply a dressing over the top if needed encouraged elevation of the limb  Follow-Up Instructions: No follow-ups on file.   Ortho Exam  Patient is alert, oriented, no adenopathy, well-dressed, normal affect, normal respiratory effort. Examination patient has well apposed wound edges without any necrosis.  He does have some bleeding but no gaping.  No ascending cellulitis.  Of note patient is on blood thinners  Imaging: No results found. No images are attached to the encounter.  Labs: Lab Results  Component Value Date   HGBA1C 5.3 06/16/2021   HGBA1C 8.2 (H) 04/10/2021   HGBA1C 6.9 (H) 01/02/2019   REPTSTATUS 06/18/2021 FINAL 06/16/2021   CULT >=100,000 COLONIES/mL KLEBSIELLA PNEUMONIAE (A) 06/16/2021   LABORGA KLEBSIELLA PNEUMONIAE (A) 06/16/2021     Lab Results  Component Value Date   ALBUMIN 2.4 (L) 06/17/2021   ALBUMIN 2.5 (L) 06/16/2021   ALBUMIN 3.0 (L) 06/15/2021    Lab Results  Component Value Date   MG 2.1 06/16/2021   MG 2.0 05/14/2021   MG 2.1 04/12/2021   No results found for: VD25OH  No results found for: PREALBUMIN CBC EXTENDED Latest Ref Rng & Units 07/08/2021 07/07/2021 06/17/2021  WBC 4.0 - 10.5 K/uL 6.5 - 7.9  RBC 4.22 - 5.81 MIL/uL 3.16(L) - 2.92(L)  HGB  13.0 - 17.0 g/dL 10.1(L) 12.6(L) 9.5(L)  HCT 39.0 - 52.0 % 32.7(L) 37.0(L) 31.3(L)  PLT 150 - 400 K/uL 136(L) - 160  NEUTROABS 1.7 - 7.7 K/uL - - -  LYMPHSABS 0.7 - 4.0 K/uL - - -     There is no height or weight on file to calculate BMI.  Orders:  No orders of the defined types were placed in this encounter.  No orders of the defined types were placed in this encounter.    Procedures: No procedures performed  Clinical Data: No additional findings.  ROS:  All other systems negative, except as noted in the HPI. Review of Systems  Objective: Vital Signs: There were no vitals taken for this visit.  Specialty Comments:  No specialty comments available.  PMFS History: Patient Active Problem List   Diagnosis Date Noted   Wound dehiscence 07/07/2021   Hx of BKA, left ([place])    Dehiscence of amputation stump ([place])    Urinary retention 06/16/2021   Coronary artery disease involving native heart without angina pectoris 06/16/2021   Mixed diabetic hyperlipidemia associated with type 2 diabetes mellitus ([place]) 06/16/2021   GERD without esophagitis 06/16/2021   UTI (urinary tract infection) 06/16/2021   Pressure injury of skin 06/16/2021  Insomnia 06/15/2021   Therapeutic opioid-induced constipation (OIC) 18/84/1660   Complicated UTI (urinary tract infection) 06/15/2021   Below-knee amputation of left lower extremity ([place]) 05/18/2021   Acute on chronic systolic (congestive) heart failure (HCC)    Ischemic ulcer of left heel ([place])    Cellulitis 05/07/2021   Ischemic ulcer diabetic foot ([place]) 05/07/2021   Critical lower limb ischemia ([place]) 04/20/2021   Sepsis ([place]) 04/20/2021   NSTEMI (non-ST elevated myocardial infarction) ([place]) 04/20/2021   Pressure ulcer, stage 2 ([place]) 04/11/2021   Acute heart failure ([place]) 04/10/2021   Uremia 04/10/2021   Gangrene of left foot (HCC)    PAD (peripheral artery disease) (HCC)    End-stage renal disease on hemodialysis (HCC)    Dyspnea  on exertion    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 ([place]) 09/02/2015   Hyperlipidemia 09/02/2015   Essential hypertension, benign 09/02/2015   Obesity due to excess calories 09/02/2015   Past Medical History:  Diagnosis Date   Anemia of chronic disease    CAD (coronary artery disease)    Diabetes mellitus without complication (HCC)    ESRD (end stage renal disease) on dialysis ([place])    MWF in [place]   History of bleeding ulcers    History of TIAs    Hypertension    NSTEMI (non-ST elevated myocardial infarction) ([place]) 04/2021   PAD (peripheral artery disease) (HCC)    Renal cancer, right ([place])    s/p right radical nephrectomy 01/08/19    Family History  Problem Relation Age of Onset   Stroke Mother    Stroke Father    Cancer Father    Cancer Brother     Past Surgical History:  Procedure Laterality Date   ABDOMINAL AORTOGRAM W/LOWER EXTREMITY Bilateral 04/14/2021   Procedure: ABDOMINAL AORTOGRAM W/LOWER EXTREMITY;  Surgeon: Elam Dutch, MD;  Location: [employer] [facility];  Service: Vascular;  Laterality: Bilateral;   AMPUTATION Left 05/12/2021   Procedure: LEFT BELOW KNEE AMPUTATION;  Surgeon: Newt Minion, MD;  Location: [place];  Service: Orthopedics;  Laterality: Left;   CATARACT EXTRACTION W/PHACO Left 02/15/2014   Procedure: CATARACT EXTRACTION PHACO AND INTRAOCULAR LENS PLACEMENT (IOC);  Surgeon: Tonny Branch, MD;  Location: [facility];  Service: Ophthalmology;  Laterality: Left;  CDE 10.84   CATARACT EXTRACTION W/PHACO Right 12/14/2013   Procedure: CATARACT EXTRACTION PHACO AND INTRAOCULAR LENS PLACEMENT (IOC);  Surgeon: Tonny Branch, MD;  Location: [facility];  Service: Ophthalmology;  Laterality: Right;  CDE:  16.30   CHOLECYSTECTOMY  02/2013   EYE SURGERY     IR FLUORO GUIDE CV LINE RIGHT  01/05/2019   IR US GUIDE VASC ACCESS RIGHT  01/05/2019   LAPAROSCOPIC NEPHRECTOMY Right 01/08/2019    Procedure: LAPAROSCOPIC RADICAL NEPHRECTOMY;  Surgeon: Raynelle Bring, MD;  Location: [facility];  Service: Urology;  Laterality: Right;   LEFT HEART CATH AND CORS/GRAFTS ANGIOGRAPHY N/A 04/14/2021   Procedure: LEFT HEART CATH AND CORS/GRAFTS ANGIOGRAPHY;  Surgeon: Jolaine Artist, MD;  Location: [place] [facility];  Service: Cardiovascular;  Laterality: N/A;   PENILE PROSTHESIS IMPLANT     PERIPHERAL VASCULAR INTERVENTION Left 04/19/2021   Procedure: PERIPHERAL VASCULAR INTERVENTION;  Surgeon: Cherre Robins, MD;  Location: [place] [facility];  Service: Cardiovascular;  Laterality: Left;   STUMP REVISION Left 07/07/2021   Procedure: REVISION LEFT BELOW KNEE AMPUTATION;  Surgeon: Newt Minion, MD;  Location: [facility]  OR;  Service: Orthopedics;  Laterality: Left;   TOE AMPUTATION Right 2013   [facility]   Social History   Occupational History   Not on file  Tobacco Use   Smoking status: Never   Smokeless tobacco: Never  Vaping Use   Vaping Use: Never used  Substance and Sexual Activity   Alcohol use: Yes    Comment: social drink    Drug use: No   Sexual activity: Not on file

## 2021-07-14 ENCOUNTER — Inpatient Hospital Stay: Payer: Managed Care, Other (non HMO) | Admitting: Registered Nurse

## 2021-07-16 ENCOUNTER — Other Ambulatory Visit: Payer: Self-pay | Admitting: Physical Medicine and Rehabilitation

## 2021-07-18 ENCOUNTER — Telehealth: Payer: Self-pay | Admitting: Orthopedic Surgery

## 2021-07-18 NOTE — Telephone Encounter (Signed)
Cecille Rubin (home health nurse) calling on behalf of the pt. She is sch'd to see him today but did not see any instructions with his wound care for today, any specifics for cleaning, wound vac instructions, etc. Pt does have an upcoming appt for this Thursday. The best call back number for her is [phone].

## 2021-07-18 NOTE — Telephone Encounter (Signed)
I called and sw Cecille Rubin  and advised that the vac was removed at the last OV and that he should wash area with dial soap and water and apply shrinker direct contact to skin and a dry dressing on top if there is drainage. Pt has follow up in the office 07/20/21 to call with any other questions.

## 2021-07-20 ENCOUNTER — Other Ambulatory Visit: Payer: Self-pay

## 2021-07-20 ENCOUNTER — Ambulatory Visit (INDEPENDENT_AMBULATORY_CARE_PROVIDER_SITE_OTHER): Payer: Managed Care, Other (non HMO) | Admitting: Orthopedic Surgery

## 2021-07-20 ENCOUNTER — Encounter: Payer: Self-pay | Admitting: Orthopedic Surgery

## 2021-07-20 DIAGNOSIS — S88112A Complete traumatic amputation at level between knee and ankle, left lower leg, initial encounter: Secondary | ICD-10-CM

## 2021-07-20 DIAGNOSIS — Z89512 Acquired absence of left leg below knee: Secondary | ICD-10-CM

## 2021-07-20 DIAGNOSIS — I739 Peripheral vascular disease, unspecified: Secondary | ICD-10-CM

## 2021-07-20 DIAGNOSIS — L97919 Non-pressure chronic ulcer of unspecified part of right lower leg with unspecified severity: Secondary | ICD-10-CM

## 2021-07-20 DIAGNOSIS — I87333 Chronic venous hypertension (idiopathic) with ulcer and inflammation of bilateral lower extremity: Secondary | ICD-10-CM

## 2021-07-20 DIAGNOSIS — L97929 Non-pressure chronic ulcer of unspecified part of left lower leg with unspecified severity: Secondary | ICD-10-CM

## 2021-07-20 NOTE — Progress Notes (Signed)
Office Visit Note   Patient: Maxwell Harmon           Date of Birth: 1956-11-05           MRN: [id number] Visit Date: 07/20/2021              Requested by: [name], [place] [address] PCP: [name], DO  Chief Complaint  Patient presents with   Left Leg - Routine Post Op    07/07/21 revision left BKA       HPI: Patient is a 65 year old gentleman who presents about 2 weeks status post revision left below-knee amputation he is currently wearing a 3 extra-large stump shrinker and a limb protector.  Patient complains of redness and tenderness.  He is on doxycycline.  Assessment & Plan: Visit Diagnoses:  1. Below-knee amputation of left lower extremity ([place])   2. PVD (peripheral vascular disease) ([place])   3. Chronic venous hypertension (idiopathic) with ulcer and inflammation of bilateral lower extremity (HCC)     Plan: Patient will keep his leg elevated through the weekend and reevaluate on Monday.  If the cellulitis does not resolve would need to proceed with revision of the surgery.  Follow-Up Instructions: Return in about 1 week (around 07/27/2021) for Follow-up Monday.   Ortho Exam  Patient is alert, oriented, no adenopathy, well-dressed, normal affect, normal respiratory effort. Examination patient has pitting edema and redness around the incision.  With elevation approximately 50% of the redness resolves.  There is some clear serosanguineous drainage there is no purulence.  There is pitting edema through the entire left lower extremity as well as the right lower extremity.  Imaging: No results found.   Labs: Lab Results  Component Value Date   HGBA1C 5.3 06/16/2021   HGBA1C 8.2 (H) 04/10/2021   HGBA1C 6.9 (H) 01/02/2019   REPTSTATUS 06/18/2021 FINAL 06/16/2021   CULT >=100,000 COLONIES/mL KLEBSIELLA PNEUMONIAE (A) 06/16/2021   LABORGA KLEBSIELLA PNEUMONIAE (A) 06/16/2021     Lab Results  Component Value Date   ALBUMIN 2.4 (L)  06/17/2021   ALBUMIN 2.5 (L) 06/16/2021   ALBUMIN 3.0 (L) 06/15/2021    Lab Results  Component Value Date   MG 2.1 06/16/2021   MG 2.0 05/14/2021   MG 2.1 04/12/2021   No results found for: VD25OH  No results found for: PREALBUMIN CBC EXTENDED Latest Ref Rng & Units 07/08/2021 07/07/2021 06/17/2021  WBC 4.0 - 10.5 K/uL 6.5 - 7.9  RBC 4.22 - 5.81 MIL/uL 3.16(L) - 2.92(L)  HGB 13.0 - 17.0 g/dL 10.1(L) 12.6(L) 9.5(L)  HCT 39.0 - 52.0 % 32.7(L) 37.0(L) 31.3(L)  PLT 150 - 400 K/uL 136(L) - 160  NEUTROABS 1.7 - 7.7 K/uL - - -  LYMPHSABS 0.7 - 4.0 K/uL - - -     There is no height or weight on file to calculate BMI.  Orders:  No orders of the defined types were placed in this encounter.  No orders of the defined types were placed in this encounter.    Procedures: No procedures performed  Clinical Data: No additional findings.  ROS:  All other systems negative, except as noted in the HPI. Review of Systems  Objective: Vital Signs: There were no vitals taken for this visit.  Specialty Comments:  No specialty comments available.  PMFS History: Patient Active Problem List   Diagnosis Date Noted   Wound dehiscence 07/07/2021   Hx of BKA, left (HCC)    Dehiscence of  amputation stump ([place])    Urinary retention 06/16/2021   Coronary artery disease involving native heart without angina pectoris 06/16/2021   Mixed diabetic hyperlipidemia associated with type 2 diabetes mellitus ([place]) 06/16/2021   GERD without esophagitis 06/16/2021   UTI (urinary tract infection) 06/16/2021   Pressure injury of skin 06/16/2021   Insomnia 06/15/2021   Therapeutic opioid-induced constipation (OIC) 41/66/0630   Complicated UTI (urinary tract infection) 06/15/2021   Below-knee amputation of left lower extremity ([place]) 05/18/2021   Acute on chronic systolic (congestive) heart failure (HCC)    Ischemic ulcer of left heel ([place])    Cellulitis 05/07/2021   Ischemic ulcer diabetic foot ([place])  05/07/2021   Critical lower limb ischemia ([place]) 04/20/2021   Sepsis ([place]) 04/20/2021   NSTEMI (non-ST elevated myocardial infarction) ([place]) 04/20/2021   Pressure ulcer, stage 2 ([place]) 04/11/2021   Acute heart failure ([place]) 04/10/2021   Uremia 04/10/2021   Gangrene of left foot (HCC)    PAD (peripheral artery disease) ([place])    End-stage renal disease on hemodialysis (HCC)    Dyspnea on exertion    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 ([place]) 09/02/2015   Hyperlipidemia 09/02/2015   Essential hypertension, benign 09/02/2015   Obesity due to excess calories 09/02/2015   Past Medical History:  Diagnosis Date   Anemia of chronic disease    CAD (coronary artery disease)    Diabetes mellitus without complication (HCC)    ESRD (end stage renal disease) on dialysis ([place])    MWF in [place]   History of bleeding ulcers    History of TIAs    Hypertension    NSTEMI (non-ST elevated myocardial infarction) ([place]) 04/2021   PAD (peripheral artery disease) (HCC)    Renal cancer, right ([place])    s/p right radical nephrectomy 01/08/19    Family History  Problem Relation Age of Onset   Stroke Mother    Stroke Father    Cancer Father    Cancer Brother     Past Surgical History:  Procedure Laterality Date   ABDOMINAL AORTOGRAM W/LOWER EXTREMITY Bilateral 04/14/2021   Procedure: ABDOMINAL AORTOGRAM W/LOWER EXTREMITY;  Surgeon: Elam Dutch, MD;  Location: [facility];  Service: Vascular;  Laterality: Bilateral;   AMPUTATION Left 05/12/2021   Procedure: LEFT BELOW KNEE AMPUTATION;  Surgeon: Newt Minion, MD;  Location: [facility];  Service: Orthopedics;  Laterality: Left;   CATARACT EXTRACTION W/PHACO Left 02/15/2014   Procedure: CATARACT EXTRACTION PHACO AND INTRAOCULAR LENS PLACEMENT (IOC);  Surgeon: Tonny Branch, MD;  [facility];  Service: Ophthalmology;  Laterality: Left;  CDE 10.84   CATARACT  EXTRACTION W/PHACO Right 12/14/2013   Procedure: CATARACT EXTRACTION PHACO AND INTRAOCULAR LENS PLACEMENT (IOC);  Surgeon: Tonny Branch, MD;  Location: [facility];  Service: Ophthalmology;  Laterality: Right;  CDE:  16.30   CHOLECYSTECTOMY  02/2013   EYE SURGERY     IR FLUORO GUIDE CV LINE RIGHT  01/05/2019   IR US GUIDE VASC ACCESS RIGHT  01/05/2019   LAPAROSCOPIC NEPHRECTOMY Right 01/08/2019   Procedure: LAPAROSCOPIC RADICAL NEPHRECTOMY;  Surgeon: Raynelle Bring, MD;  [facility];  Service: Urology;  Laterality: Right;   LEFT HEART CATH AND CORS/GRAFTS ANGIOGRAPHY N/A 04/14/2021   Procedure: LEFT HEART CATH AND CORS/GRAFTS ANGIOGRAPHY;  Surgeon: Jolaine Artist, MD;  Location: [facility];  Service: Cardiovascular;  Laterality: N/A;   PENILE PROSTHESIS IMPLANT  PERIPHERAL VASCULAR INTERVENTION Left 04/19/2021   Procedure: PERIPHERAL VASCULAR INTERVENTION;  Surgeon: Cherre Robins, MD;  Location: [facility];  Service: Cardiovascular;  Laterality: Left;   STUMP REVISION Left 07/07/2021   Procedure: REVISION LEFT BELOW KNEE AMPUTATION;  Surgeon: Newt Minion, MD;  Location: [facility];  Service: Orthopedics;  Laterality: Left;   TOE AMPUTATION Right 2013   [facility]   Social History   Occupational History   Not on file  Tobacco Use   Smoking status: Never   Smokeless tobacco: Never  Vaping Use   Vaping Use: Never used  Substance and Sexual Activity   Alcohol use: Yes    Comment: social drink    Drug use: No   Sexual activity: Not on file

## 2021-07-24 ENCOUNTER — Ambulatory Visit (INDEPENDENT_AMBULATORY_CARE_PROVIDER_SITE_OTHER): Payer: Managed Care, Other (non HMO) | Admitting: Orthopedic Surgery

## 2021-07-24 ENCOUNTER — Other Ambulatory Visit: Payer: Self-pay | Admitting: Orthopedic Surgery

## 2021-07-24 ENCOUNTER — Encounter: Payer: Self-pay | Admitting: Orthopedic Surgery

## 2021-07-24 ENCOUNTER — Other Ambulatory Visit: Payer: Self-pay

## 2021-07-24 DIAGNOSIS — T8781 Dehiscence of amputation stump: Secondary | ICD-10-CM

## 2021-07-24 NOTE — Progress Notes (Signed)
Office Visit Note   Patient: Maxwell Harmon           Date of Birth: 1956-05-16           MRN: [id number] Visit Date: 07/24/2021              Requested by: [name], [place] [address] PCP: [name], DO  Chief Complaint  Patient presents with   Left Leg - Routine Post Op    07/07/21 revision left BKA       HPI: Patient is a 65 year old gentleman who presents in follow-up status post revision left transtibial amputation.  Patient states the cellulitis has improved but he still having clear drainage.  Patient is scheduled to have his dialysis port removed in [place] on Tuesday.  He undergoes dialysis Monday Wednesday Friday.  Assessment & Plan: Visit Diagnoses:  1. Dehiscence of amputation stump ([place])     Plan: We will plan for surgical revision of the left transtibial amputation on Friday this should give him time to get over the port removal on Tuesday.  Anticipate dialysis Saturday in the hospital.  Follow-Up Instructions: Return in about 1 week (around 07/31/2021).   Ortho Exam  Patient is alert, oriented, no adenopathy, well-dressed, normal affect, normal respiratory effort. Examination the cellulitis is resolving but the wound is showing progressive dehiscence he has exposed tibia and tracking of a large area of soft tissue defect tunneling.  Imaging: No results found. No images are attached to the encounter.  Labs: Lab Results  Component Value Date   HGBA1C 5.3 06/16/2021   HGBA1C 8.2 (H) 04/10/2021   HGBA1C 6.9 (H) 01/02/2019   REPTSTATUS 06/18/2021 FINAL 06/16/2021   CULT >=100,000 COLONIES/mL KLEBSIELLA PNEUMONIAE (A) 06/16/2021   LABORGA KLEBSIELLA PNEUMONIAE (A) 06/16/2021     Lab Results  Component Value Date   ALBUMIN 2.4 (L) 06/17/2021   ALBUMIN 2.5 (L) 06/16/2021   ALBUMIN 3.0 (L) 06/15/2021    Lab Results  Component Value Date   MG 2.1 06/16/2021   MG 2.0 05/14/2021   MG 2.1 04/12/2021    No results found for: VD25OH  No results found for: PREALBUMIN CBC EXTENDED Latest Ref Rng & Units 07/08/2021 07/07/2021 06/17/2021  WBC 4.0 - 10.5 K/uL 6.5 - 7.9  RBC 4.22 - 5.81 MIL/uL 3.16(L) - 2.92(L)  HGB 13.0 - 17.0 g/dL 10.1(L) 12.6(L) 9.5(L)  HCT 39.0 - 52.0 % 32.7(L) 37.0(L) 31.3(L)  PLT 150 - 400 K/uL 136(L) - 160  NEUTROABS 1.7 - 7.7 K/uL - - -  LYMPHSABS 0.7 - 4.0 K/uL - - -     There is no height or weight on file to calculate BMI.  Orders:  No orders of the defined types were placed in this encounter.  No orders of the defined types were placed in this encounter.    Procedures: No procedures performed  Clinical Data: No additional findings.  ROS:  All other systems negative, except as noted in the HPI. Review of Systems  Objective: Vital Signs: There were no vitals taken for this visit.  Specialty Comments:  No specialty comments available.  PMFS History: Patient Active Problem List   Diagnosis Date Noted   Wound dehiscence 07/07/2021   Hx of BKA, left ([place])    Dehiscence of amputation stump ([place])    Urinary retention 06/16/2021   Coronary artery disease involving native heart without angina pectoris 06/16/2021   Mixed diabetic hyperlipidemia associated with type 2 diabetes  mellitus ([place]) 06/16/2021   GERD without esophagitis 06/16/2021   UTI (urinary tract infection) 06/16/2021   Pressure injury of skin 06/16/2021   Insomnia 06/15/2021   Therapeutic opioid-induced constipation (OIC) 34/19/3790   Complicated UTI (urinary tract infection) 06/15/2021   Below-knee amputation of left lower extremity ([place]) 05/18/2021   Acute on chronic systolic (congestive) heart failure (HCC)    Ischemic ulcer of left heel ([place])    Cellulitis 05/07/2021   Ischemic ulcer diabetic foot ([place]) 05/07/2021   Critical lower limb ischemia ([place]) 04/20/2021   Sepsis ([place]) 04/20/2021   NSTEMI (non-ST elevated myocardial infarction) ([place]) 04/20/2021   Pressure ulcer, stage 2  ([place]) 04/11/2021   Acute heart failure ([place]) 04/10/2021   Uremia 04/10/2021   Gangrene of left foot (HCC)    PAD (peripheral artery disease) ([place])    End-stage renal disease on hemodialysis (HCC)    Dyspnea on exertion    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 ([place]) 09/02/2015   Hyperlipidemia 09/02/2015   Essential hypertension, benign 09/02/2015   Obesity due to excess calories 09/02/2015   Past Medical History:  Diagnosis Date   Anemia of chronic disease    CAD (coronary artery disease)    Diabetes mellitus without complication (HCC)    ESRD (end stage renal disease) on dialysis ([place])    MWF in [place]   History of bleeding ulcers    History of TIAs    Hypertension    NSTEMI (non-ST elevated myocardial infarction) ([place]) 04/2021   PAD (peripheral artery disease) (HCC)    Renal cancer, right ([place])    s/p right radical nephrectomy 01/08/19    Family History  Problem Relation Age of Onset   Stroke Mother    Stroke Father    Cancer Father    Cancer Brother     Past Surgical History:  Procedure Laterality Date   ABDOMINAL AORTOGRAM W/LOWER EXTREMITY Bilateral 04/14/2021   Procedure: ABDOMINAL AORTOGRAM W/LOWER EXTREMITY;  Surgeon: Elam Dutch, MD;  Location: [facility];  Service: Vascular;  Laterality: Bilateral;   AMPUTATION Left 05/12/2021   Procedure: LEFT BELOW KNEE AMPUTATION;  Surgeon: Newt Minion, MD;  Location: [facility];  Service: Orthopedics;  Laterality: Left;   CATARACT EXTRACTION W/PHACO Left 02/15/2014   Procedure: CATARACT EXTRACTION PHACO AND INTRAOCULAR LENS PLACEMENT (IOC);  Surgeon: Tonny Branch, MD;  [facility];  Service: Ophthalmology;  Laterality: Left;  CDE 10.84   CATARACT EXTRACTION W/PHACO Right 12/14/2013   Procedure: CATARACT EXTRACTION PHACO AND INTRAOCULAR LENS PLACEMENT (IOC);  Surgeon: Tonny Branch, MD;  [facility];  Service: Ophthalmology;   Laterality: Right;  CDE:  16.30   CHOLECYSTECTOMY  02/2013   EYE SURGERY     IR FLUORO GUIDE CV LINE RIGHT  01/05/2019   IR US GUIDE VASC ACCESS RIGHT  01/05/2019   LAPAROSCOPIC NEPHRECTOMY Right 01/08/2019   Procedure: LAPAROSCOPIC RADICAL NEPHRECTOMY;  Surgeon: Raynelle Bring, MD;  [facility];  Service: Urology;  Laterality: Right;   LEFT HEART CATH AND CORS/GRAFTS ANGIOGRAPHY N/A 04/14/2021   Procedure: LEFT HEART CATH AND CORS/GRAFTS ANGIOGRAPHY;  Surgeon: Jolaine Artist, MD;  Location: [facility];  Service: Cardiovascular;  Laterality: N/A;   PENILE PROSTHESIS IMPLANT     PERIPHERAL VASCULAR INTERVENTION Left 04/19/2021   Procedure: PERIPHERAL VASCULAR INTERVENTION;  Surgeon: Cherre Robins, MD;  Location: [facility];  Service: Cardiovascular;  Laterality: Left;   STUMP REVISION Left 07/07/2021   Procedure: REVISION LEFT BELOW KNEE AMPUTATION;  Surgeon: Newt Minion, MD;  Location: [facility];  Service: Orthopedics;  Laterality: Left;   TOE AMPUTATION Right 2013   [facility]   Social History   Occupational History   Not on file  Tobacco Use   Smoking status: Never   Smokeless tobacco: Never  Vaping Use   Vaping Use: Never used  Substance and Sexual Activity   Alcohol use: Yes    Comment: social drink    Drug use: No   Sexual activity: Not on file

## 2021-07-25 ENCOUNTER — Other Ambulatory Visit: Payer: Self-pay

## 2021-07-25 ENCOUNTER — Other Ambulatory Visit: Payer: Self-pay | Admitting: Physician Assistant

## 2021-07-25 ENCOUNTER — Encounter (HOSPITAL_COMMUNITY): Payer: Self-pay | Admitting: Orthopedic Surgery

## 2021-07-25 NOTE — Progress Notes (Signed)
Anesthesia Chart Review:  Pt is a same day work up   Case: [id number] Date/Time: 07/28/21 0830   Procedure: REVISION LEFT BELOW KNEE AMPUTATION (Left)   Anesthesia type: Choice   Pre-op diagnosis: Osteomyelitis, Dehiscence Left Below Knee Amputation   Location: [facility] OR ROOM 05 / [place] [facility]   Surgeons: Newt Minion, MD       Patient is a 65 year old male scheduled for the above procedure. 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    Pt to contact surgeon for perioperative Plavix instructions.  History includes never smoker, HTN, DM2, CAD (CABG x3 06/02/20: LIMA-LAD, SVG-D1 with Y-SVG-OM1, Dr. 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    - Admitted to [facility] 4/[facility]/54-0/[facility]67 for complicated UTI with history of urine retention, s/p I&O cath. Urine culture + Klebsiella pneumoniae, s/p antibiotic therapy.   - Admitted to [facility] 05/07/21-05/18/21 with cellulitis, diabetic left foot ulcer. 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 [name] discharge 06/08/21.   - Admitted to [facility] 04/10/21-04/20/21 for LLE critical limb ischemia, sepsis due to LLE cellulitis with diabetic food infection, NSTEMI. Cardiology consulted. Echo showed new LV dysfunction/acute systolic CHF with EF 61-95%, question of Takotsubo's vs. multivessel CAD/apical LAD. Cardiac cath done on 04/14/21 showed widely patent LIMA-LAD but LAD occluded immediately after LIMA plugs in and sequential vein graft torturous with mild narrowing at insertion to OM1. No options for revascularization and medical therapy recommended. He had to be transitioned from PD  to HD due to drainage issues/possible catheter malfunction.   He primarily has been cardiology during his admissions, but did not reports primary cardiologist. Last visits (in-patient) with HF cardiologist Glori Bickers, MD on 05/16/21.  Volume status improved on HD.  Requiring midodrine on HD days so no room for GDMT with soft BP.    Reviewed case with Dr. Lissa Hoard. Will get I-stat day of surgery.    PROVIDERS: [name], DO is PCP  Raynelle Bring, MD is urologist Loreli Slot, MD is nephrologist     LABS: results from 07/08/21 are consistent with prior results - CBC 07/08/21: Hgb 10.1, Hct 32.7, RBC 3.16, platelets 136 - BMP 07/08/21: K 5.8, glucose 208, Cr 5.46, BUN 53, Ca 8.5   IMAGES: 1V PCXR 06/15/21 (during admission for complicated UTI, urinary retention): FINDINGS: Previous median sternotomy. Chronic cardiomegaly. Enlarging bilateral effusions with lower lobe atelectasis and or pneumonia. Upper lungs remain largely clear. IMPRESSION: Enlarging effusions and worsening lower lobe atelectasis and or pneumonia.     EKG: 06/16/21:  normal sinus rhythm Anteroseptal infarct, old Abnormal T, consider ischemia, diffuse leads No significant change since last tracing Confirmed by [place], Peter [phone]) on 06/16/2021 4:07:11 PM     CV: Echo (Limited) 05/15/21: IMPRESSIONS   1. Left ventricular ejection fraction, by estimation, is 25 to 30%. Left  ventricular ejection fraction by 3D volume is 30 %. The left ventricle has  severely decreased function. The left ventricle demonstrates regional wall  motion abnormalities (see  scoring diagram/findings for description). There is severe hypokinesis of  the mid-to-apical inferior, mid-to-apical septal, mid-to-apical anterior,  apical lateral LV  segments. The apical segments and apex appear akinetic.  There is no LV thrombus  visualized. The left ventricular internal cavity size was mildly dilated.   2. Right ventricular  systolic function is moderately reduced. The right  ventricular size is normal.   3. The mitral valve is degenerative. Moderate mitral annular  calcification.   4. The inferior vena cava is dilated in size with <50% respiratory  variability, suggesting right atrial pressure of 15 mmHg.  - Comparison(s): Compared to prior TTE in 04/2021, there is no significant  change.      Cardiac cath 04/14/21 Haroldine Laws, Daniel, MD): Ost RCA to Prox RCA lesion is 100% stenosed. Prox Cx lesion is 95% stenosed. Prox LAD to Mid LAD lesion is 100% stenosed. Dist LAD lesion is 100% stenosed.   Findings: 1. LM ok 2. LAD 100% prox 3. LCX 95% prox 4. RCA 100% prox 5. LIMA to LAD widely patent but LAD is occluded immediately after LIMA plugs in 6. Sequential SVG to OM1 & OM2. Tortuous graft with mild narrowing at insertion to OM1 but otherwise OK. OMs are small 7. Elevated filling pressures with Ao 178/71 (111) LVEDP 36   Assessment: Severe 3v CAD with patent grafts but poor runoff into heavily diseased native circulation.    Plan/Discussion: Continue medical therapy. No other options for revascularization.      Echo 04/10/21: IMPRESSIONS   1. LV function appears preserved at the base and diminished moving toward  apex, akinetic at the apex. This is across all segments. Differential  includes takotsubo's cardiomyopathy vs. multivessel CAD. LV thrombus  excluded by contrast. Left ventricular  ejection fraction, by estimation, is 30 to 35%. The left ventricle has  moderately decreased function. The left ventricle demonstrates regional  wall motion abnormalities (see scoring diagram/findings for description).  The left ventricular internal cavity  size was severely dilated. Left ventricular diastolic parameters are  indeterminate.   2. Right ventricular systolic function is moderately reduced. The right  ventricular size is moderately enlarged.   3. Left atrial size was moderately dilated.   4.  Right atrial size was mildly dilated.   5. The mitral valve is degenerative. Mild to moderate mitral valve  regurgitation. No evidence of mitral stenosis.   6. The aortic valve is tricuspid. There is moderate calcification of the  aortic valve. There is moderate thickening of the aortic valve. Aortic  valve regurgitation is not visualized. Mild aortic valve stenosis.   7. The inferior vena cava is dilated in size with <50% respiratory  variability, suggesting right atrial pressure of 15 mmHg.  - Conclusion(s)/Recommendation(s): Severely reduced LVEF with worse wall  motion at the apex compared to the base. Concerning for takotsubo's vs.  multivessel CAD/apical LAD. Findings communicated with Dr. Acie Fredrickson.  - Comparison(s): No prior Echocardiogram in [facility] [12/19/20 echo in [facility]: LVEF 55-60%, mild AS]     Past Medical History:  Diagnosis Date   Anemia of chronic disease    Anxiety    CAD (coronary artery disease)    Depression    Diabetes mellitus without complication ([place])    ESRD (end stage renal disease) on dialysis ([place])    MWF in [place]   History of bleeding ulcers    History of TIAs    Hypertension    PAD (peripheral artery disease) (HCC)    Renal cancer, right ([place])    s/p right radical nephrectomy 01/08/19    Past Surgical History:  Procedure Laterality Date   ABDOMINAL AORTOGRAM  W/LOWER EXTREMITY Bilateral 04/14/2021   Procedure: ABDOMINAL AORTOGRAM W/LOWER EXTREMITY;  Surgeon: Elam Dutch, MD;  Location: [place] [facility];  Service: Vascular;  Laterality: Bilateral;   AMPUTATION Left 05/12/2021   Procedure: LEFT BELOW KNEE AMPUTATION;  Surgeon: Newt Minion, MD;  Location: [place];  Service: Orthopedics;  Laterality: Left;   CATARACT EXTRACTION W/PHACO Left 02/15/2014   Procedure: CATARACT EXTRACTION PHACO AND INTRAOCULAR LENS PLACEMENT (IOC);  Surgeon: Tonny Branch, MD;  Location: [facility];  Service: Ophthalmology;  Laterality: Left;  CDE 10.84   CATARACT  EXTRACTION W/PHACO Right 12/14/2013   Procedure: CATARACT EXTRACTION PHACO AND INTRAOCULAR LENS PLACEMENT (IOC);  Surgeon: Tonny Branch, MD;  Location: [facility];  Service: Ophthalmology;  Laterality: Right;  CDE:  16.30   CHOLECYSTECTOMY  02/2013   EYE SURGERY     IR FLUORO GUIDE CV LINE RIGHT  01/05/2019   IR US GUIDE VASC ACCESS RIGHT  01/05/2019   LAPAROSCOPIC NEPHRECTOMY Right 01/08/2019   Procedure: LAPAROSCOPIC RADICAL NEPHRECTOMY;  Surgeon: Raynelle Bring, MD;  Location: [facility];  Service: Urology;  Laterality: Right;   LEFT HEART CATH AND CORS/GRAFTS ANGIOGRAPHY N/A 04/14/2021   Procedure: LEFT HEART CATH AND CORS/GRAFTS ANGIOGRAPHY;  Surgeon: Jolaine Artist, MD;  Location: [place] [facility];  Service: Cardiovascular;  Laterality: N/A;   PENILE PROSTHESIS IMPLANT     PERIPHERAL VASCULAR INTERVENTION Left 04/19/2021   Procedure: PERIPHERAL VASCULAR INTERVENTION;  Surgeon: Cherre Robins, MD;  Location: [place] [facility];  Service: Cardiovascular;  Laterality: Left;   STUMP REVISION Left 07/07/2021   Procedure: REVISION LEFT BELOW KNEE AMPUTATION;  Surgeon: Newt Minion, MD;  Location: [place];  Service: Orthopedics;  Laterality: Left;   TOE AMPUTATION Right 2013   little toe-[facility]    MEDICATIONS: No current facility-administered medications for this encounter.    furosemide (LASIX) 80 MG tablet   acetaminophen (TYLENOL) 325 MG tablet   aspirin EC 81 MG tablet   atorvastatin (LIPITOR) 80 MG tablet   AURYXIA 1 GM 210 MG(Fe) tablet   bethanechol (URECHOLINE) 25 MG tablet   blood glucose meter kit and supplies KIT   Blood Glucose Monitoring Suppl (BAYER CONTOUR MONITOR) w/Device KIT   calcitRIOL (ROCALTROL) 0.25 MCG capsule   cinacalcet (SENSIPAR) 30 MG tablet   clonazePAM (KLONOPIN) 0.25 MG disintegrating tablet   clonazePAM (KLONOPIN) 0.5 MG tablet   clopidogrel (PLAVIX) 75 MG tablet   doxycycline (VIBRA-TABS) 100 MG tablet   gabapentin (NEURONTIN) 100 MG capsule    gentamicin cream (GARAMYCIN) 0.1 %   glucose blood (BAYER CONTOUR TEST) test strip   Insulin Glargine (BASAGLAR KWIKPEN) 100 UNIT/ML   Insulin Pen Needle (B-D ULTRAFINE III SHORT PEN) 31G X 8 MM MISC   Lancets MISC   linaclotide (LINZESS) 290 MCG CAPS capsule   metaxalone (SKELAXIN) 400 MG tablet   midodrine (PROAMATINE) 10 MG tablet   multivitamin (RENA-VIT) TABS tablet   oxyCODONE-acetaminophen (PERCOCET/ROXICET) 5-325 MG tablet   pantoprazole (PROTONIX) 40 MG tablet   pentoxifylline (TRENTAL) 400 MG CR tablet   polyethylene glycol (MIRALAX / GLYCOLAX) 17 g packet   QUEtiapine (SEROQUEL) 50 MG tablet   senna-docusate (SENOKOT-S) 8.6-50 MG tablet   tamsulosin (FLOMAX) 0.4 MG CAPS capsule   traMADol (ULTRAM) 50 MG tablet   venlafaxine XR (EFFEXOR-XR) 37.5 MG 24 hr capsule   zinc sulfate 220 (50 Zn) MG capsule    Anesthesia team to evaluate on the day of surgery. If labs acceptable day of  surgery, I anticipate pt can proceed with surgery as scheduled.   Willeen Cass, PhD, FNP-BC [facility] Short Stay Surgical [facility]/Anesthesiology Phone: [phone] 07/25/2021 3:11 PM

## 2021-07-25 NOTE — Anesthesia Preprocedure Evaluation (Addendum)
Anesthesia Evaluation  Patient identified by MRN, date of birth, ID band Patient awake    Reviewed: Allergy & Precautions, NPO status , Patient's Chart, lab work & pertinent test results  Airway Mallampati: II  TM Distance: >3 FB Neck ROM: Full    Dental no notable dental hx. (+) Dental Advisory Given   Pulmonary neg pulmonary ROS,    Pulmonary exam normal breath sounds clear to auscultation       Cardiovascular hypertension, Pt. on medications + CAD, + Past MI, + CABG (CABG x 3 05/2020), + Peripheral Vascular Disease (plavix) and +CHF (LVEF 20-25%)  Normal cardiovascular exam Rhythm:Regular Rate:Normal  Echo 05/2021: 1. Left ventricular ejection fraction, by estimation, is 25 to 30%. Left  ventricular ejection fraction by 3D volume is 30 %. The left ventricle has  severely decreased function. The left ventricle demonstrates regional wall  motion abnormalities (see  scoring diagram/findings for description). There is severe hypokinesis of  the mid-to-apical inferior, mid-to-apical septal, mid-to-apical anterior,  apical lateral LV segments. The apical segments and apex appear akinetic.  There is no LV thrombus  visualized. The left ventricular internal cavity size was mildly dilated.  2. Right ventricular systolic function is moderately reduced. The right  ventricular size is normal.  3. The mitral valve is degenerative. Moderate mitral annular  calcification.  4. The inferior vena cava is dilated in size with <50% respiratory  variability, suggesting right atrial pressure of 15 mmHg.    Cath 04/2021: Cardiac cath 04/14/21 Haroldine Laws, Quillian Quince, MD): ? Ost RCA to Prox RCA lesion is 100% stenosed. ? Prox Cx lesion is 95% stenosed. ? Prox LAD to Mid LAD lesion is 100% stenosed. ? Dist LAD lesion is 100% stenosed.  Findings: 1. LM ok 2. LAD 100% prox 3. LCX 95% prox 4. RCA 100% prox 5. LIMA to LAD widely patent but LAD is  occluded immediately after LIMA plugs in 6. Sequential SVG to OM1 & OM2. Tortuous graft with mild narrowing at insertion to OM1 but otherwise OK. OMs are small 7. Elevated filling pressures with Ao 178/71 (111) LVEDP 36  Assessment: Severe 3v CAD with patent grafts but poor runoff into heavily diseased native circulation.   Plan/Discussion: Continue medical therapy. No other options for revascularization.   Neuro/Psych PSYCHIATRIC DISORDERS Anxiety Depression negative neurological ROS     GI/Hepatic Neg liver ROS, GERD  Medicated and Controlled,  Endo/Other  diabetes, Well Controlled, Type 2, Insulin DependentFS 89 in preop   Renal/GU ESRF and DialysisRenal disease (HD MWF)Hx renal ca s/p right nephrectomy 2020 K 4.8  negative genitourinary   Musculoskeletal Osteo, L BKA dehiscence   Abdominal   Peds  Hematology negative hematology ROS (+) hct 36   Anesthesia Other Findings   Reproductive/Obstetrics negative OB ROS                           Anesthesia Physical Anesthesia Plan  ASA: 4  Anesthesia Plan: General and Regional   Post-op Pain Management: GA combined w/ Regional for post-op pain   Induction: Intravenous  PONV Risk Score and Plan: Ondansetron, Dexamethasone, Midazolam and Treatment may vary due to age or medical condition  Airway Management Planned: LMA  Additional Equipment: None  Intra-op Plan:   Post-operative Plan: Extubation in OR  Informed Consent: I have reviewed the patients History and Physical, chart, labs and discussed the procedure including the risks, benefits and alternatives for the proposed anesthesia with the patient or authorized  representative who has indicated his/her understanding and acceptance.     Dental advisory given  Plan Discussed with: CRNA  Anesthesia Plan Comments:        Anesthesia Quick Evaluation

## 2021-07-25 NOTE — Progress Notes (Signed)
Spoke with pt.'s wife given pre-op instructions. Day before surgery 07/28/19 bedtime dose of glargine insulin only take half of insulin dose.  Wife to call Dr. Jess Barters office concerining when to stop or continue ASA or plavix.

## 2021-07-26 ENCOUNTER — Other Ambulatory Visit: Payer: Self-pay | Admitting: Physical Medicine and Rehabilitation

## 2021-07-27 ENCOUNTER — Telehealth: Payer: Self-pay

## 2021-07-27 NOTE — Telephone Encounter (Signed)
Call Dialysis consult tomorrow revision left BKA

## 2021-07-27 NOTE — H&P (Signed)
Maxwell Harmon is an 65 y.o. male.   Chief Complaint: wound Dehiscence left leg HPI: Patient is a 65 year old gentleman who presents in follow-up status post revision left transtibial amputation.  Patient states the cellulitis has improved but he still having clear drainage.  Patient is scheduled to have his dialysis port removed in [place] on Tuesday.  He undergoes dialysis Monday Wednesday Friday.  Past Medical History:  Diagnosis Date   Anemia of chronic disease    Anxiety    CAD (coronary artery disease)    Depression    Diabetes mellitus without complication (HCC)    ESRD (end stage renal disease) on dialysis ([place])    MWF in [place]   History of bleeding ulcers    History of TIAs    Hypertension    PAD (peripheral artery disease) (H[facility])    Renal cancer, right ([place])    s/p right radical nephrectomy 01/08/19    Past Surgical History:  Procedure Laterality Date   ABDOMINAL AORTOGRAM W/LOWER EXTREMITY Bilateral 04/14/2021   Procedure: ABDOMINAL AORTOGRAM W/LOWER EXTREMITY;  Surgeon: Elam Dutch, MD;  Location: [place] [facility];  Service: Vascular;  Laterality: Bilateral;   AMPUTATION Left 05/12/2021   Procedure: LEFT BELOW KNEE AMPUTATION;  Surgeon: Newt Minion, MD;  Location: [place];  Service: Orthopedics;  Laterality: Left;   CATARACT EXTRACTION W/PHACO Left 02/15/2014   Procedure: CATARACT EXTRACTION PHACO AND INTRAOCULAR LENS PLACEMENT (IOC);  Surgeon: Tonny Branch, MD;  Location: [facility];  Service: Ophthalmology;  Laterality: Left;  CDE 10.84   CATARACT EXTRACTION W/PHACO Right 12/14/2013   Procedure: CATARACT EXTRACTION PHACO AND INTRAOCULAR LENS PLACEMENT (IOC);  Surgeon: Tonny Branch, MD;  Location: [facility];  Service: Ophthalmology;  Laterality: Right;  CDE:  16.30   CHOLECYSTECTOMY  02/2013   EYE SURGERY     IR FLUORO GUIDE CV LINE RIGHT  01/05/2019   IR US GUIDE VASC ACCESS RIGHT  01/05/2019   LAPAROSCOPIC NEPHRECTOMY Right 01/08/2019   Procedure: LAPAROSCOPIC  RADICAL NEPHRECTOMY;  Surgeon: Raynelle Bring, MD;  Location: [facility];  Service: Urology;  Laterality: Right;   LEFT HEART CATH AND CORS/GRAFTS ANGIOGRAPHY N/A 04/14/2021   Procedure: LEFT HEART CATH AND CORS/GRAFTS ANGIOGRAPHY;  Surgeon: Jolaine Artist, MD;  Location: [place] [facility];  Service: Cardiovascular;  Laterality: N/A;   PENILE PROSTHESIS IMPLANT     PERIPHERAL VASCULAR INTERVENTION Left 04/19/2021   Procedure: PERIPHERAL VASCULAR INTERVENTION;  Surgeon: Cherre Robins, MD;  Location: [place] [facility];  Service: Cardiovascular;  Laterality: Left;   STUMP REVISION Left 07/07/2021   Procedure: REVISION LEFT BELOW KNEE AMPUTATION;  Surgeon: Newt Minion, MD;  Location: [place];  Service: Orthopedics;  Laterality: Left;   TOE AMPUTATION Right 2013   little toe-[facility]    Family History  Problem Relation Age of Onset   Stroke Mother    Stroke Father    Cancer Father    Cancer Brother    Social History:  reports that he has never smoked. He has never used smokeless tobacco. He reports current alcohol use. He reports that he does not use drugs.  Allergies:  Allergies  Allergen Reactions   Ambien [Zolpidem] Other (See Comments)    It causes pt to have insomnia, NOT sleep- idiosyncratic reaction    No medications prior to admission.    No results found for this or any previous visit (from the past 48 hour(s)). No results found.  Review of Systems  All other systems reviewed and are negative.  There were no vitals taken for this visit. Physical Exam  Patient is alert, oriented, no adenopathy, well-dressed, normal affect, normal respiratory effort. Examination the cellulitis is resolving but the wound is showing progressive dehiscence he has exposed tibia and tracking of a large area of soft tissue defect tunneling.Heart RRR Lungs Clear Assessment/Plan 1. Dehiscence of amputation stump ([place])       Plan: We will plan for surgical revision of the left  transtibial amputation on Friday this should give him time to get over the port removal on Tuesday.  Anticipate dialysis Saturday in the hospital.  [place], [place] 07/27/2021, 4:28 PM

## 2021-07-28 ENCOUNTER — Encounter (HOSPITAL_COMMUNITY)
Admission: RE | Disposition: A | Discharge status: Rehabilitation Facility | Payer: Self-pay | Source: Home / Self Care | Attending: Orthopedic Surgery

## 2021-07-28 ENCOUNTER — Inpatient Hospital Stay (HOSPITAL_COMMUNITY): Payer: Managed Care, Other (non HMO) | Admitting: Emergency Medicine

## 2021-07-28 ENCOUNTER — Other Ambulatory Visit: Payer: Self-pay | Admitting: Orthopedic Surgery

## 2021-07-28 ENCOUNTER — Encounter (HOSPITAL_COMMUNITY): Payer: Self-pay | Admitting: Orthopedic Surgery

## 2021-07-28 ENCOUNTER — Inpatient Hospital Stay (HOSPITAL_COMMUNITY)
Admission: RE | Admit: 2021-07-28 | Discharge: 2021-08-03 | DRG: 474 | Disposition: A | Discharge status: Rehabilitation Facility | Payer: Managed Care, Other (non HMO) | Attending: Orthopedic Surgery | Admitting: Orthopedic Surgery

## 2021-07-28 ENCOUNTER — Other Ambulatory Visit: Payer: Self-pay

## 2021-07-28 DIAGNOSIS — Z992 Dependence on renal dialysis: Secondary | ICD-10-CM

## 2021-07-28 DIAGNOSIS — I120 Hypertensive chronic kidney disease with stage 5 chronic kidney disease or end stage renal disease: Secondary | ICD-10-CM | POA: Diagnosis present

## 2021-07-28 DIAGNOSIS — Y835 Amputation of limb(s) as the cause of abnormal reaction of the patient, or of later complication, without mention of misadventure at the time of the procedure: Secondary | ICD-10-CM | POA: Diagnosis present

## 2021-07-28 DIAGNOSIS — M898X9 Other specified disorders of bone, unspecified site: Secondary | ICD-10-CM | POA: Diagnosis present

## 2021-07-28 DIAGNOSIS — Z9889 Other specified postprocedural states: Secondary | ICD-10-CM | POA: Diagnosis not present

## 2021-07-28 DIAGNOSIS — F32A Depression, unspecified: Secondary | ICD-10-CM | POA: Diagnosis present

## 2021-07-28 DIAGNOSIS — E8770 Fluid overload, unspecified: Secondary | ICD-10-CM | POA: Diagnosis present

## 2021-07-28 DIAGNOSIS — E1151 Type 2 diabetes mellitus with diabetic peripheral angiopathy without gangrene: Secondary | ICD-10-CM | POA: Diagnosis present

## 2021-07-28 DIAGNOSIS — I252 Old myocardial infarction: Secondary | ICD-10-CM | POA: Diagnosis not present

## 2021-07-28 DIAGNOSIS — Z794 Long term (current) use of insulin: Secondary | ICD-10-CM

## 2021-07-28 DIAGNOSIS — I2510 Atherosclerotic heart disease of native coronary artery without angina pectoris: Secondary | ICD-10-CM | POA: Diagnosis present

## 2021-07-28 DIAGNOSIS — Z79899 Other long term (current) drug therapy: Secondary | ICD-10-CM

## 2021-07-28 DIAGNOSIS — L03116 Cellulitis of left lower limb: Secondary | ICD-10-CM | POA: Diagnosis present

## 2021-07-28 DIAGNOSIS — I953 Hypotension of hemodialysis: Secondary | ICD-10-CM | POA: Diagnosis not present

## 2021-07-28 DIAGNOSIS — R0602 Shortness of breath: Secondary | ICD-10-CM | POA: Diagnosis not present

## 2021-07-28 DIAGNOSIS — Z20822 Contact with and (suspected) exposure to covid-19: Secondary | ICD-10-CM | POA: Diagnosis present

## 2021-07-28 DIAGNOSIS — Z888 Allergy status to other drugs, medicaments and biological substances status: Secondary | ICD-10-CM

## 2021-07-28 DIAGNOSIS — Z89512 Acquired absence of left leg below knee: Secondary | ICD-10-CM

## 2021-07-28 DIAGNOSIS — Z515 Encounter for palliative care: Secondary | ICD-10-CM | POA: Diagnosis not present

## 2021-07-28 DIAGNOSIS — Z823 Family history of stroke: Secondary | ICD-10-CM

## 2021-07-28 DIAGNOSIS — M869 Osteomyelitis, unspecified: Secondary | ICD-10-CM | POA: Diagnosis present

## 2021-07-28 DIAGNOSIS — E1122 Type 2 diabetes mellitus with diabetic chronic kidney disease: Secondary | ICD-10-CM | POA: Diagnosis present

## 2021-07-28 DIAGNOSIS — S88112A Complete traumatic amputation at level between knee and ankle, left lower leg, initial encounter: Secondary | ICD-10-CM | POA: Diagnosis present

## 2021-07-28 DIAGNOSIS — K5900 Constipation, unspecified: Secondary | ICD-10-CM | POA: Diagnosis not present

## 2021-07-28 DIAGNOSIS — E11649 Type 2 diabetes mellitus with hypoglycemia without coma: Secondary | ICD-10-CM | POA: Diagnosis not present

## 2021-07-28 DIAGNOSIS — Z9049 Acquired absence of other specified parts of digestive tract: Secondary | ICD-10-CM

## 2021-07-28 DIAGNOSIS — Z85528 Personal history of other malignant neoplasm of kidney: Secondary | ICD-10-CM | POA: Diagnosis not present

## 2021-07-28 DIAGNOSIS — K5901 Slow transit constipation: Secondary | ICD-10-CM | POA: Diagnosis not present

## 2021-07-28 DIAGNOSIS — Z7902 Long term (current) use of antithrombotics/antiplatelets: Secondary | ICD-10-CM

## 2021-07-28 DIAGNOSIS — E1169 Type 2 diabetes mellitus with other specified complication: Secondary | ICD-10-CM | POA: Diagnosis present

## 2021-07-28 DIAGNOSIS — Z8673 Personal history of transient ischemic attack (TIA), and cerebral infarction without residual deficits: Secondary | ICD-10-CM

## 2021-07-28 DIAGNOSIS — Z4781 Encounter for orthopedic aftercare following surgical amputation: Secondary | ICD-10-CM | POA: Diagnosis present

## 2021-07-28 DIAGNOSIS — R34 Anuria and oliguria: Secondary | ICD-10-CM | POA: Diagnosis present

## 2021-07-28 DIAGNOSIS — E871 Hypo-osmolality and hyponatremia: Secondary | ICD-10-CM | POA: Diagnosis present

## 2021-07-28 DIAGNOSIS — I959 Hypotension, unspecified: Secondary | ICD-10-CM | POA: Diagnosis present

## 2021-07-28 DIAGNOSIS — N186 End stage renal disease: Secondary | ICD-10-CM | POA: Diagnosis present

## 2021-07-28 DIAGNOSIS — F329 Major depressive disorder, single episode, unspecified: Secondary | ICD-10-CM | POA: Diagnosis not present

## 2021-07-28 DIAGNOSIS — I255 Ischemic cardiomyopathy: Secondary | ICD-10-CM | POA: Diagnosis present

## 2021-07-28 DIAGNOSIS — T8781 Dehiscence of amputation stump: Secondary | ICD-10-CM | POA: Diagnosis present

## 2021-07-28 DIAGNOSIS — I739 Peripheral vascular disease, unspecified: Secondary | ICD-10-CM | POA: Diagnosis not present

## 2021-07-28 DIAGNOSIS — E669 Obesity, unspecified: Secondary | ICD-10-CM | POA: Diagnosis present

## 2021-07-28 DIAGNOSIS — J90 Pleural effusion, not elsewhere classified: Secondary | ICD-10-CM | POA: Diagnosis not present

## 2021-07-28 DIAGNOSIS — I5023 Acute on chronic systolic (congestive) heart failure: Secondary | ICD-10-CM | POA: Diagnosis present

## 2021-07-28 DIAGNOSIS — R0902 Hypoxemia: Secondary | ICD-10-CM | POA: Diagnosis present

## 2021-07-28 DIAGNOSIS — R269 Unspecified abnormalities of gait and mobility: Secondary | ICD-10-CM | POA: Diagnosis present

## 2021-07-28 DIAGNOSIS — N2581 Secondary hyperparathyroidism of renal origin: Secondary | ICD-10-CM | POA: Diagnosis present

## 2021-07-28 DIAGNOSIS — E8809 Other disorders of plasma-protein metabolism, not elsewhere classified: Secondary | ICD-10-CM | POA: Diagnosis present

## 2021-07-28 DIAGNOSIS — T8130XA Disruption of wound, unspecified, initial encounter: Secondary | ICD-10-CM

## 2021-07-28 DIAGNOSIS — Z7189 Other specified counseling: Secondary | ICD-10-CM | POA: Diagnosis not present

## 2021-07-28 DIAGNOSIS — D631 Anemia in chronic kidney disease: Secondary | ICD-10-CM | POA: Diagnosis present

## 2021-07-28 DIAGNOSIS — E119 Type 2 diabetes mellitus without complications: Secondary | ICD-10-CM | POA: Diagnosis not present

## 2021-07-28 DIAGNOSIS — R278 Other lack of coordination: Secondary | ICD-10-CM | POA: Diagnosis not present

## 2021-07-28 DIAGNOSIS — I132 Hypertensive heart and chronic kidney disease with heart failure and with stage 5 chronic kidney disease, or end stage renal disease: Secondary | ICD-10-CM | POA: Diagnosis present

## 2021-07-28 DIAGNOSIS — K5903 Drug induced constipation: Secondary | ICD-10-CM | POA: Diagnosis not present

## 2021-07-28 DIAGNOSIS — Z7982 Long term (current) use of aspirin: Secondary | ICD-10-CM

## 2021-07-28 DIAGNOSIS — F064 Anxiety disorder due to known physiological condition: Secondary | ICD-10-CM | POA: Diagnosis present

## 2021-07-28 DIAGNOSIS — F5104 Psychophysiologic insomnia: Secondary | ICD-10-CM | POA: Diagnosis present

## 2021-07-28 DIAGNOSIS — Z905 Acquired absence of kidney: Secondary | ICD-10-CM

## 2021-07-28 HISTORY — DX: Depression, unspecified: F32.A

## 2021-07-28 HISTORY — DX: Anxiety disorder, unspecified: F41.9

## 2021-07-28 HISTORY — PX: STUMP REVISION: SHX6102

## 2021-07-28 LAB — GLUCOSE, CAPILLARY
Glucose-Capillary: 79 mg/dL (ref 70–99)
Glucose-Capillary: 90 mg/dL (ref 70–99)
Glucose-Capillary: 95 mg/dL (ref 70–99)
Glucose-Capillary: 88 mg/dL (ref 70–99)
Glucose-Capillary: 120 mg/dL — ABNORMAL HIGH (ref 70–99)

## 2021-07-28 LAB — POCT I-STAT, CHEM 8
BUN: 55 mg/dL — ABNORMAL HIGH (ref 8–23)
Calcium, Ion: 1.03 mmol/L — ABNORMAL LOW (ref 1.15–1.40)
Chloride: 97 mmol/L — ABNORMAL LOW (ref 98–111)
Creatinine, Ser: 5.2 mg/dL — ABNORMAL HIGH (ref 0.61–1.24)
Glucose, Bld: 89 mg/dL (ref 70–99)
HCT: 36 % — ABNORMAL LOW (ref 39.0–52.0)
Hemoglobin: 12.2 g/dL — ABNORMAL LOW (ref 13.0–17.0)
Potassium: 4.8 mmol/L (ref 3.5–5.1)
Sodium: 136 mmol/L (ref 135–145)
TCO2: 30 mmol/L (ref 22–32)

## 2021-07-28 LAB — SARS CORONAVIRUS 2 BY RT PCR (HOSPITAL ORDER, PERFORMED IN ~~LOC~~ HOSPITAL LAB): SARS Coronavirus 2: NEGATIVE

## 2021-07-28 SURGERY — REVISION, AMPUTATION SITE
Anesthesia: Regional | Laterality: Left

## 2021-07-28 MED ORDER — ASCORBIC ACID 500 MG PO TABS
1000.0000 mg | ORAL_TABLET | Freq: Every day | ORAL | Status: DC
  Administered 2021-07-29 – 2021-08-03 (×6): 1000 mg via ORAL
  Filled 2021-07-28 (×6): qty 2

## 2021-07-28 MED ORDER — MIDAZOLAM HCL 2 MG/2ML IJ SOLN
INTRAMUSCULAR | Status: AC
  Administered 2021-07-28: 1 mg via INTRAVENOUS
  Filled 2021-07-28: qty 2

## 2021-07-28 MED ORDER — CINACALCET HCL 30 MG PO TABS
30.0000 mg | ORAL_TABLET | ORAL | Status: DC
  Administered 2021-07-31: 30 mg via ORAL
  Filled 2021-07-28: qty 1

## 2021-07-28 MED ORDER — FENTANYL CITRATE (PF) 250 MCG/5ML IJ SOLN
INTRAMUSCULAR | Status: AC
  Filled 2021-07-28: qty 5

## 2021-07-28 MED ORDER — ONDANSETRON HCL 4 MG/2ML IJ SOLN: 4.0000 mg | Freq: Once | INTRAMUSCULAR | Status: DC | PRN

## 2021-07-28 MED ORDER — EPHEDRINE SULFATE-NACL 50-0.9 MG/10ML-% IV SOSY
PREFILLED_SYRINGE | INTRAVENOUS | Status: DC | PRN
  Administered 2021-07-28: 10 mg via INTRAVENOUS
  Administered 2021-07-28: 15 mg via INTRAVENOUS

## 2021-07-28 MED ORDER — VENLAFAXINE HCL ER 37.5 MG PO CP24
37.5000 mg | ORAL_CAPSULE | Freq: Every evening | ORAL | Status: DC
  Administered 2021-07-28 – 2021-08-02 (×6): 37.5 mg via ORAL
  Filled 2021-07-28 (×7): qty 1

## 2021-07-28 MED ORDER — HYDROMORPHONE HCL 1 MG/ML IJ SOLN: 0.2500 mg | INTRAMUSCULAR | Status: DC | PRN

## 2021-07-28 MED ORDER — VASOPRESSIN 20 UNIT/ML IV SOLN
INTRAVENOUS | Status: DC | PRN
  Administered 2021-07-28 (×3): .5 [IU] via INTRAVENOUS

## 2021-07-28 MED ORDER — SODIUM CHLORIDE 0.9 % IV SOLN: INTRAVENOUS | Status: DC

## 2021-07-28 MED ORDER — ROPIVACAINE HCL 5 MG/ML IJ SOLN
INTRAMUSCULAR | Status: DC | PRN
  Administered 2021-07-28: 30 mL via PERINEURAL

## 2021-07-28 MED ORDER — 0.9 % SODIUM CHLORIDE (POUR BTL) OPTIME
TOPICAL | Status: DC | PRN
  Administered 2021-07-28: 1000 mL

## 2021-07-28 MED ORDER — CHLORHEXIDINE GLUCONATE 0.12 % MT SOLN
15.0000 mL | Freq: Once | OROMUCOSAL | Status: AC
  Administered 2021-07-28: 15 mL via OROMUCOSAL
  Filled 2021-07-28: qty 15

## 2021-07-28 MED ORDER — MIDODRINE HCL 5 MG PO TABS: 2.5000 mg | ORAL_TABLET | ORAL | Status: DC

## 2021-07-28 MED ORDER — GUAIFENESIN-DM 100-10 MG/5ML PO SYRP: 15.0000 mL | ORAL_SOLUTION | ORAL | Status: DC | PRN

## 2021-07-28 MED ORDER — OXYCODONE HCL 5 MG PO TABS: 5.0000 mg | ORAL_TABLET | Freq: Once | ORAL | Status: DC | PRN

## 2021-07-28 MED ORDER — ACETAMINOPHEN 325 MG PO TABS
325.0000 mg | ORAL_TABLET | Freq: Four times a day (QID) | ORAL | Status: DC | PRN
  Administered 2021-08-03: 500 mg via ORAL

## 2021-07-28 MED ORDER — ALUM & MAG HYDROXIDE-SIMETH 200-200-20 MG/5ML PO SUSP: 15.0000 mL | ORAL | Status: DC | PRN

## 2021-07-28 MED ORDER — ORAL CARE MOUTH RINSE: 15.0000 mL | Freq: Once | OROMUCOSAL | Status: AC

## 2021-07-28 MED ORDER — FUROSEMIDE 40 MG PO TABS
80.0000 mg | ORAL_TABLET | Freq: Two times a day (BID) | ORAL | Status: DC
  Administered 2021-07-30 – 2021-08-03 (×7): 80 mg via ORAL
  Filled 2021-07-28 (×8): qty 2

## 2021-07-28 MED ORDER — POLYETHYLENE GLYCOL 3350 17 G PO PACK: 17.0000 g | PACK | Freq: Every day | ORAL | Status: DC | PRN

## 2021-07-28 MED ORDER — INSULIN GLARGINE-YFGN 100 UNIT/ML ~~LOC~~ SOLN
5.0000 [IU] | Freq: Every day | SUBCUTANEOUS | Status: DC
  Administered 2021-07-28 – 2021-08-02 (×5): 5 [IU] via SUBCUTANEOUS
  Filled 2021-07-28 (×7): qty 0.05

## 2021-07-28 MED ORDER — INSULIN ASPART 100 UNIT/ML IJ SOLN
0.0000 [IU] | Freq: Three times a day (TID) | INTRAMUSCULAR | Status: DC
  Administered 2021-07-28 – 2021-08-03 (×3): 2 [IU] via SUBCUTANEOUS

## 2021-07-28 MED ORDER — BUPIVACAINE LIPOSOME 1.3 % IJ SUSP
INTRAMUSCULAR | Status: DC | PRN
  Administered 2021-07-28: 10 mL via PERINEURAL

## 2021-07-28 MED ORDER — ASPIRIN EC 81 MG PO TBEC
81.0000 mg | DELAYED_RELEASE_TABLET | Freq: Every day | ORAL | Status: DC
  Administered 2021-07-29 – 2021-08-03 (×6): 81 mg via ORAL
  Filled 2021-07-28 (×6): qty 1

## 2021-07-28 MED ORDER — OXYCODONE HCL 5 MG/5ML PO SOLN: 5.0000 mg | Freq: Once | ORAL | Status: DC | PRN

## 2021-07-28 MED ORDER — MAGNESIUM SULFATE 2 GM/50ML IV SOLN
2.0000 g | Freq: Every day | INTRAVENOUS | Status: DC | PRN
Start: 2021-07-28 — End: 2021-08-03

## 2021-07-28 MED ORDER — FERRIC CITRATE 1 GM 210 MG(FE) PO TABS
210.0000 mg | ORAL_TABLET | Freq: Three times a day (TID) | ORAL | Status: DC
  Administered 2021-07-28 – 2021-08-03 (×15): 210 mg via ORAL
  Filled 2021-07-28 (×19): qty 1

## 2021-07-28 MED ORDER — ONDANSETRON HCL 4 MG/2ML IJ SOLN
INTRAMUSCULAR | Status: DC | PRN
  Administered 2021-07-28: 4 mg via INTRAVENOUS

## 2021-07-28 MED ORDER — VASOPRESSIN 20 UNIT/ML IV SOLN
INTRAVENOUS | Status: AC
  Filled 2021-07-28: qty 1

## 2021-07-28 MED ORDER — CLONAZEPAM 0.5 MG PO TABS: 0.5000 mg | ORAL_TABLET | Freq: Two times a day (BID) | ORAL | Status: DC | PRN

## 2021-07-28 MED ORDER — INSULIN ASPART 100 UNIT/ML IJ SOLN
2.0000 [IU] | Freq: Three times a day (TID) | INTRAMUSCULAR | Status: DC
  Administered 2021-07-28 – 2021-08-01 (×5): 2 [IU] via SUBCUTANEOUS

## 2021-07-28 MED ORDER — FENTANYL CITRATE PF 50 MCG/ML IJ SOSY
50.0000 ug | PREFILLED_SYRINGE | Freq: Once | INTRAMUSCULAR | Status: DC
Start: 2021-07-28 — End: 2021-07-28
  Filled 2021-07-28: qty 1

## 2021-07-28 MED ORDER — PHENYLEPHRINE HCL-NACL 20-0.9 MG/250ML-% IV SOLN
INTRAVENOUS | Status: DC | PRN
  Administered 2021-07-28: 100 ug/min via INTRAVENOUS

## 2021-07-28 MED ORDER — PANTOPRAZOLE SODIUM 40 MG PO TBEC
40.0000 mg | DELAYED_RELEASE_TABLET | Freq: Every day | ORAL | Status: DC
  Administered 2021-07-28 – 2021-08-03 (×7): 40 mg via ORAL
  Filled 2021-07-28 (×7): qty 1

## 2021-07-28 MED ORDER — PHENOL 1.4 % MT LIQD: 1.0000 | OROMUCOSAL | Status: DC | PRN

## 2021-07-28 MED ORDER — FENTANYL CITRATE (PF) 100 MCG/2ML IJ SOLN
INTRAMUSCULAR | Status: DC | PRN
  Administered 2021-07-28: 50 ug via INTRAVENOUS

## 2021-07-28 MED ORDER — ATORVASTATIN CALCIUM 40 MG PO TABS
40.0000 mg | ORAL_TABLET | Freq: Every day | ORAL | Status: DC
  Administered 2021-07-28 – 2021-08-03 (×7): 40 mg via ORAL
  Filled 2021-07-28 (×7): qty 1

## 2021-07-28 MED ORDER — OXYCODONE HCL 5 MG PO TABS
10.0000 mg | ORAL_TABLET | ORAL | Status: DC | PRN
  Administered 2021-07-29: 15 mg via ORAL
  Administered 2021-07-30 – 2021-08-01 (×2): 10 mg via ORAL
  Filled 2021-07-28 (×4): qty 3
  Filled 2021-07-28: qty 2

## 2021-07-28 MED ORDER — TEMAZEPAM 15 MG PO CAPS: 15.0000 mg | ORAL_CAPSULE | Freq: Every evening | ORAL | Status: DC | PRN

## 2021-07-28 MED ORDER — TRAZODONE HCL 50 MG PO TABS
50.0000 mg | ORAL_TABLET | Freq: Every day | ORAL | Status: DC
  Administered 2021-07-28 – 2021-08-02 (×6): 50 mg via ORAL
  Filled 2021-07-28 (×6): qty 1

## 2021-07-28 MED ORDER — CLOPIDOGREL BISULFATE 75 MG PO TABS
75.0000 mg | ORAL_TABLET | Freq: Every day | ORAL | Status: DC
  Administered 2021-07-28 – 2021-08-03 (×7): 75 mg via ORAL
  Filled 2021-07-28 (×7): qty 1

## 2021-07-28 MED ORDER — CEFAZOLIN SODIUM-DEXTROSE 2-4 GM/100ML-% IV SOLN: 2.0000 g | Freq: Three times a day (TID) | INTRAVENOUS | Status: DC

## 2021-07-28 MED ORDER — METOPROLOL TARTRATE 5 MG/5ML IV SOLN: 2.0000 mg | INTRAVENOUS | Status: DC | PRN

## 2021-07-28 MED ORDER — CEFAZOLIN SODIUM-DEXTROSE 2-4 GM/100ML-% IV SOLN
2.0000 g | INTRAVENOUS | Status: AC
  Administered 2021-07-28: 2 g via INTRAVENOUS
  Filled 2021-07-28: qty 100

## 2021-07-28 MED ORDER — PROPOFOL 10 MG/ML IV BOLUS
INTRAVENOUS | Status: DC | PRN
  Administered 2021-07-28: 100 mg via INTRAVENOUS

## 2021-07-28 MED ORDER — GABAPENTIN 100 MG PO CAPS
100.0000 mg | ORAL_CAPSULE | Freq: Every day | ORAL | Status: DC | PRN
  Administered 2021-08-01 – 2021-08-02 (×2): 100 mg via ORAL
  Filled 2021-07-28 (×2): qty 1

## 2021-07-28 MED ORDER — PHENYLEPHRINE HCL-NACL 20-0.9 MG/250ML-% IV SOLN
INTRAVENOUS | Status: AC
  Filled 2021-07-28: qty 500

## 2021-07-28 MED ORDER — LABETALOL HCL 5 MG/ML IV SOLN: 10.0000 mg | INTRAVENOUS | Status: DC | PRN

## 2021-07-28 MED ORDER — CHLORHEXIDINE GLUCONATE CLOTH 2 % EX PADS
6.0000 | MEDICATED_PAD | Freq: Every day | CUTANEOUS | Status: DC
  Administered 2021-07-29 – 2021-08-03 (×6): 6 via TOPICAL

## 2021-07-28 MED ORDER — MIDODRINE HCL 5 MG PO TABS: 5.0000 mg | ORAL_TABLET | ORAL | Status: DC

## 2021-07-28 MED ORDER — OXYCODONE HCL 5 MG PO TABS
5.0000 mg | ORAL_TABLET | ORAL | Status: DC | PRN
  Administered 2021-07-29 – 2021-08-01 (×7): 10 mg via ORAL
  Administered 2021-08-02: 5 mg via ORAL
  Administered 2021-08-02: 10 mg via ORAL
  Filled 2021-07-28 (×3): qty 2
  Filled 2021-07-28: qty 1
  Filled 2021-07-28 (×6): qty 2
  Filled 2021-07-28: qty 1
  Filled 2021-07-28: qty 2

## 2021-07-28 MED ORDER — DOCUSATE SODIUM 100 MG PO CAPS
100.0000 mg | ORAL_CAPSULE | Freq: Every day | ORAL | Status: DC
  Administered 2021-07-29 – 2021-08-03 (×6): 100 mg via ORAL
  Filled 2021-07-28 (×7): qty 1

## 2021-07-28 MED ORDER — PHENYLEPHRINE HCL (PRESSORS) 10 MG/ML IV SOLN
INTRAVENOUS | Status: DC | PRN
  Administered 2021-07-28: 80 ug via INTRAVENOUS
  Administered 2021-07-28 (×2): 120 ug via INTRAVENOUS
  Administered 2021-07-28: 80 ug via INTRAVENOUS

## 2021-07-28 MED ORDER — LACTATED RINGERS IV SOLN: INTRAVENOUS | Status: DC

## 2021-07-28 MED ORDER — FENTANYL CITRATE (PF) 100 MCG/2ML IJ SOLN
INTRAMUSCULAR | Status: AC
  Administered 2021-07-28: 100 ug
  Filled 2021-07-28: qty 2

## 2021-07-28 MED ORDER — ONDANSETRON HCL 4 MG/2ML IJ SOLN: 4.0000 mg | Freq: Four times a day (QID) | INTRAMUSCULAR | Status: DC | PRN

## 2021-07-28 MED ORDER — BISACODYL 5 MG PO TBEC: 5.0000 mg | DELAYED_RELEASE_TABLET | Freq: Every day | ORAL | Status: DC | PRN

## 2021-07-28 MED ORDER — MIDAZOLAM HCL 2 MG/2ML IJ SOLN
1.0000 mg | Freq: Once | INTRAMUSCULAR | Status: AC
  Filled 2021-07-28: qty 1

## 2021-07-28 MED ORDER — POTASSIUM CHLORIDE CRYS ER 20 MEQ PO TBCR: 20.0000 meq | EXTENDED_RELEASE_TABLET | Freq: Every day | ORAL | Status: DC | PRN

## 2021-07-28 MED ORDER — MAGNESIUM CITRATE PO SOLN
1.0000 | Freq: Once | ORAL | Status: DC | PRN
  Filled 2021-07-28: qty 296

## 2021-07-28 MED ORDER — DIPHENHYDRAMINE HCL 12.5 MG/5ML PO ELIX
12.5000 mg | ORAL_SOLUTION | ORAL | Status: DC | PRN
Start: 2021-07-28 — End: 2021-08-03

## 2021-07-28 MED ORDER — RENA-VITE PO TABS
1.0000 | ORAL_TABLET | Freq: Every day | ORAL | Status: DC
  Administered 2021-07-28 – 2021-08-02 (×6): 1 via ORAL
  Filled 2021-07-28 (×6): qty 1

## 2021-07-28 MED ORDER — INSULIN ASPART 100 UNIT/ML IJ SOLN: 4.0000 [IU] | Freq: Three times a day (TID) | INTRAMUSCULAR | Status: DC

## 2021-07-28 MED ORDER — ACETAMINOPHEN 500 MG PO TABS
1000.0000 mg | ORAL_TABLET | Freq: Once | ORAL | Status: AC
  Administered 2021-07-28: 1000 mg via ORAL
  Filled 2021-07-28: qty 2

## 2021-07-28 MED ORDER — MIDAZOLAM HCL 2 MG/2ML IJ SOLN
INTRAMUSCULAR | Status: AC
  Filled 2021-07-28: qty 2

## 2021-07-28 MED ORDER — INSULIN GLARGINE-YFGN 100 UNIT/ML ~~LOC~~ SOLN
10.0000 [IU] | Freq: Every day | SUBCUTANEOUS | Status: DC
  Filled 2021-07-28: qty 0.1

## 2021-07-28 MED ORDER — LIDOCAINE 2% (20 MG/ML) 5 ML SYRINGE
INTRAMUSCULAR | Status: DC | PRN
  Administered 2021-07-28: 20 mg via INTRAVENOUS

## 2021-07-28 MED ORDER — ZINC SULFATE 220 (50 ZN) MG PO CAPS
220.0000 mg | ORAL_CAPSULE | Freq: Every day | ORAL | Status: DC
  Administered 2021-07-28 – 2021-08-03 (×7): 220 mg via ORAL
  Filled 2021-07-28 (×7): qty 1

## 2021-07-28 MED ORDER — HYDRALAZINE HCL 20 MG/ML IJ SOLN: 5.0000 mg | INTRAMUSCULAR | Status: DC | PRN

## 2021-07-28 MED ORDER — PENTOXIFYLLINE ER 400 MG PO TBCR
400.0000 mg | EXTENDED_RELEASE_TABLET | Freq: Three times a day (TID) | ORAL | Status: DC
  Administered 2021-07-28 – 2021-08-03 (×15): 400 mg via ORAL
  Filled 2021-07-28 (×19): qty 1

## 2021-07-28 MED ORDER — JUVEN PO PACK
1.0000 | PACK | Freq: Two times a day (BID) | ORAL | Status: DC
  Administered 2021-07-30 – 2021-08-03 (×8): 1 via ORAL
  Filled 2021-07-28 (×11): qty 1

## 2021-07-28 MED ORDER — HYDROMORPHONE HCL 1 MG/ML IJ SOLN
0.5000 mg | INTRAMUSCULAR | Status: DC | PRN
  Administered 2021-07-30 – 2021-08-01 (×2): 1 mg via INTRAVENOUS
  Filled 2021-07-28 (×2): qty 1

## 2021-07-28 SURGICAL SUPPLY — 32 items
BAG COUNTER SPONGE SURGICOUNT (BAG) ×2 IMPLANT
BAG SPNG CNTER NS LX DISP (BAG) ×1
BAG SURGICOUNT SPONGE COUNTING (BAG) ×1
BLADE SAW RECIP 87.9 MT (BLADE) IMPLANT
BLADE SURG 21 STRL SS (BLADE) ×3 IMPLANT
CANISTER WOUND CARE 500ML ATS (WOUND CARE) ×3 IMPLANT
COVER SURGICAL LIGHT HANDLE (MISCELLANEOUS) ×3 IMPLANT
DRAPE EXTREMITY T 121X128X90 (DISPOSABLE) ×3 IMPLANT
DRAPE HALF SHEET 40X57 (DRAPES) ×3 IMPLANT
DRAPE INCISE IOBAN 66X45 STRL (DRAPES) ×3 IMPLANT
DRAPE U-SHAPE 47X51 STRL (DRAPES) ×6 IMPLANT
DRESSING PREVENA PLUS CUSTOM (GAUZE/BANDAGES/DRESSINGS) ×1 IMPLANT
DRSG PREVENA PLUS CUSTOM (GAUZE/BANDAGES/DRESSINGS) ×3
DURAPREP 26ML APPLICATOR (WOUND CARE) ×3 IMPLANT
ELECT REM PT RETURN 9FT ADLT (ELECTROSURGICAL) ×3
ELECTRODE REM PT RTRN 9FT ADLT (ELECTROSURGICAL) ×1 IMPLANT
GLOVE SURG ORTHO LTX SZ9 (GLOVE) ×3 IMPLANT
GLOVE SURG UNDER POLY LF SZ9 (GLOVE) ×3 IMPLANT
GOWN STRL REUS W/ TWL XL LVL3 (GOWN DISPOSABLE) ×2 IMPLANT
GOWN STRL REUS W/TWL XL LVL3 (GOWN DISPOSABLE) ×6
KIT BASIN OR (CUSTOM PROCEDURE TRAY) ×3 IMPLANT
KIT TURNOVER KIT B (KITS) ×3 IMPLANT
MANIFOLD NEPTUNE II (INSTRUMENTS) ×3 IMPLANT
NS IRRIG 1000ML POUR BTL (IV SOLUTION) ×3 IMPLANT
PACK GENERAL/GYN (CUSTOM PROCEDURE TRAY) ×3 IMPLANT
PAD ARMBOARD 7.5X6 YLW CONV (MISCELLANEOUS) ×3 IMPLANT
PREVENA RESTOR ARTHOFORM 46X30 (CANNISTER) ×3 IMPLANT
STAPLER VISISTAT 35W (STAPLE) IMPLANT
SUT ETHILON 2 0 PSLX (SUTURE) ×6 IMPLANT
SUT SILK 2 0 (SUTURE)
SUT SILK 2-0 18XBRD TIE 12 (SUTURE) IMPLANT
TOWEL GREEN STERILE (TOWEL DISPOSABLE) ×3 IMPLANT

## 2021-07-28 NOTE — Consult Note (Signed)
Renal Service Consult Note [facility]  Maxwell Harmon 07/28/2021  D , MD Requesting Physician: Dr Duda  Reason for Consult: ESRD pt sp leg amputation HPI: The patient is a 65 y.o. year-old w/ hx of CAD, DM2, ESRD on HD, TIA, HTN and PAD. Pt had wound f/u after recent L BKA and the wound was significantly dehisced so the patient was admitted and underwent BKA stump revision surgery today. Asked to see for ESRD.    Pt gets HD in [place], on HD x 3 yrs, MWF, using L arm AVF.  Has had some recent issues w/ leg swelling and volume overload. No other c/o's.    ROS - denies CP, no joint pain, no HA, no blurry vision, no rash, no diarrhea, no nausea/ vomiting, no dysuria, no difficulty voiding   Past Medical History  Past Medical History:  Diagnosis Date   Anemia of chronic disease    Anxiety    CAD (coronary artery disease)    Depression    Diabetes mellitus without complication (HCC)    ESRD (end stage renal disease) on dialysis (HCC)    MWF in [place]   History of bleeding ulcers    History of TIAs    Hypertension    PAD (peripheral artery disease) (HCC)    Renal cancer, right (HCC)    s/p right radical nephrectomy 01/08/19   Past Surgical History  Past Surgical History:  Procedure Laterality Date   ABDOMINAL AORTOGRAM W/LOWER EXTREMITY Bilateral 04/14/2021   Procedure: ABDOMINAL AORTOGRAM W/LOWER EXTREMITY;  Surgeon: Fields, Bryten E, MD;  Location: [facility];  Service: Vascular;  Laterality: Bilateral;   AMPUTATION Left 05/12/2021   Procedure: LEFT BELOW KNEE AMPUTATION;  Surgeon: Duda, Marcus V, MD;  Location: [facility] OR;  Service: Orthopedics;  Laterality: Left;   CATARACT EXTRACTION W/PHACO Left 02/15/2014   Procedure: CATARACT EXTRACTION PHACO AND INTRAOCULAR LENS PLACEMENT (IOC);  Surgeon: Kerry Hunt, MD;  Location: [facility] ORS;  Service: Ophthalmology;  Laterality: Left;  CDE 10.84   CATARACT EXTRACTION W/PHACO Right 12/14/2013    Procedure: CATARACT EXTRACTION PHACO AND INTRAOCULAR LENS PLACEMENT (IOC);  Surgeon: Kerry Hunt, MD;  Location: [facility] ORS;  Service: Ophthalmology;  Laterality: Right;  CDE:  16.30   CHOLECYSTECTOMY  02/2013   EYE SURGERY     IR FLUORO GUIDE CV LINE RIGHT  01/05/2019   IR US GUIDE VASC ACCESS RIGHT  01/05/2019   LAPAROSCOPIC NEPHRECTOMY Right 01/08/2019   Procedure: LAPAROSCOPIC RADICAL NEPHRECTOMY;  Surgeon: Borden, Lester, MD;  Location: [facility];  Service: Urology;  Laterality: Right;   LEFT HEART CATH AND CORS/GRAFTS ANGIOGRAPHY N/A 04/14/2021   Procedure: LEFT HEART CATH AND CORS/GRAFTS ANGIOGRAPHY;  Surgeon: Bensimhon, Daniel R, MD;  Location: [facility];  Service: Cardiovascular;  Laterality: N/A;   PENILE PROSTHESIS IMPLANT     PERIPHERAL VASCULAR INTERVENTION Left 04/19/2021   Procedure: PERIPHERAL VASCULAR INTERVENTION;  Surgeon: Hawken, Thomas N, MD;  Location: [facility];  Service: Cardiovascular;  Laterality: Left;   STUMP REVISION Left 07/07/2021   Procedure: REVISION LEFT BELOW KNEE AMPUTATION;  Surgeon: Duda, Marcus V, MD;  Location: [facility] OR;  Service: Orthopedics;  Laterality: Left;   TOE AMPUTATION Right 2013   [facility]   Family History  Family History  Problem Relation Age of Onset   Stroke Mother    Stroke Father    Cancer Father    Cancer Brother    Social History  reports that   he has never smoked. He has never used smokeless tobacco. He reports current alcohol use. He reports that he does not use drugs. Allergies  Allergies  Allergen Reactions   Ambien [Zolpidem] Other (See Comments)    It causes pt to have insomnia, NOT sleep- idiosyncratic reaction   Home medications Prior to Admission medications   Medication Sig Start Date End Date Taking? Authorizing Provider  aspirin EC 81 MG tablet Take 81 mg by mouth in the morning. Swallow whole.   Yes [provider]  atorvastatin (LIPITOR) 40 MG tablet Take 40 mg by mouth in the morning and  at bedtime.   Yes [provider]  AURYXIA 1 GM 210 MG(Fe) tablet Take 210-420 mg by mouth See admin instructions. Take 1 tablet by mouth with each home meal, and if eating out take 2 tablet with that meal 06/16/21  Yes [provider]  cinacalcet (SENSIPAR) 30 MG tablet Take 1 tablet (30 mg total) by mouth once a week. Take on Mondays Patient taking differently: Take 30 mg by mouth every Monday. 06/08/21  Yes Love, Pamela S, PA-C  clonazePAM (KLONOPIN) 0.5 MG tablet Take 0.5 mg by mouth 2 (two) times daily as needed for anxiety.   Yes [provider]  doxycycline (VIBRA-TABS) 100 MG tablet Take 1 tablet (100 mg total) by mouth 2 (two) times daily. Patient taking differently: Take 100 mg by mouth 2 (two) times daily. continuous 06/27/21  Yes Duda, Marcus V, MD  gabapentin (NEURONTIN) 100 MG capsule Take 100 mg by mouth daily as needed (pain.).   Yes [provider]  Insulin Glargine (BASAGLAR KWIKPEN) 100 UNIT/ML Inject 5 Units into the skin at bedtime. Patient taking differently: Inject 15 Units into the skin at bedtime. 04/20/21 07/27/23 Yes Choi, Jennifer, DO  insulin lispro (HUMALOG) 100 UNIT/ML KwikPen Inject 0-10 Units into the skin 4 (four) times daily - after meals and at bedtime. Sliding Scale Insulin   Yes [provider]  metaxalone (SKELAXIN) 400 MG tablet Take 1 tablet (400 mg total) by mouth 3 (three) times daily as needed for muscle spasms (please offer- pt forgets to ask). 06/08/21  Yes Love, Pamela S, PA-C  midodrine (PROAMATINE) 2.5 MG tablet Take 2.5 mg by mouth every Monday, Wednesday, and Friday with hemodialysis. 07/24/21  Yes [provider]  oxyCODONE-acetaminophen (PERCOCET/ROXICET) 5-325 MG tablet Take 1 tablet by mouth every 4 (four) hours as needed. 07/08/21  Yes Duda, Marcus V, MD  polyethylene glycol (MIRALAX / GLYCOLAX) 17 g packet Take 17 g by mouth daily as needed for mild constipation. 06/08/21  Yes Love, Pamela S, PA-C   traZODone (DESYREL) 50 MG tablet Take 50 mg by mouth at bedtime.   Yes [provider]  venlafaxine XR (EFFEXOR-XR) 37.5 MG 24 hr capsule Take 37.5 mg by mouth every evening. 06/12/21  Yes [provider]  zinc sulfate 220 (50 Zn) MG capsule Take 1 capsule (220 mg total) by mouth daily. Patient taking differently: Take 220 mg by mouth 2 (two) times daily. 05/18/21  Yes Lancaster, William C, MD  acetaminophen (TYLENOL) 325 MG tablet Take 1-2 tablets (325-650 mg total) by mouth every 4 (four) hours as needed for mild pain. Patient not taking: Reported on 07/26/2021 05/30/21   Love, Pamela S, PA-C  atorvastatin (LIPITOR) 80 MG tablet Take 1 tablet (80 mg total) by mouth daily. Patient taking differently: Take 80 mg by mouth 2 (two) times daily. 06/08/21   Love, Pamela S, PA-C  bethanechol (  URECHOLINE) 25 MG tablet Take 1 tablet (25 mg total) by mouth 3 (three) times daily. Patient not taking: Reported on 07/26/2021 06/08/21   Bary Leriche, PA-C  blood glucose meter kit and supplies KIT Dispense based on patient and insurance preference. Use up to four times daily as directed. (FOR ICD-9 250.00, 250.01). 11/08/15   Cassandria Anger, MD  Blood Glucose Monitoring Suppl (BAYER CONTOUR MONITOR) w/Device KIT 1 each by Does not apply route 4 (four) times daily. Test 4 x daily 11/08/15   Cassandria Anger, MD  calcitRIOL (ROCALTROL) 0.25 MCG capsule Take 1 capsule (0.25 mcg total) by mouth 2 (two) times daily. Patient not taking: No sig reported 06/08/21   Love, Ivan Anchors, PA-C  CALCIUM PO Take 1 tablet by mouth in the morning and at bedtime.    [provider]  clonazePAM (KLONOPIN) 0.25 MG disintegrating tablet Take 1 tablet (0.25 mg total) by mouth 2 (two) times daily as needed (Anxiety). Patient not taking: No sig reported 06/08/21   Bary Leriche, PA-C  clopidogrel (PLAVIX) 75 MG tablet Take 1 tablet (75 mg total) by mouth daily. Patient taking differently: Take 75 mg by mouth at  bedtime. 06/08/21   Love, Ivan Anchors, PA-C  furosemide (LASIX) 80 MG tablet Take 1 tablet (80 mg total) by mouth 2 (two) times daily. 06/08/21   Love, Ivan Anchors, PA-C  gentamicin cream (GARAMYCIN) 0.1 % Apply topically daily. Patient not taking: No sig reported 05/18/21   Little Ishikawa, MD  glucose blood (BAYER CONTOUR TEST) test strip Use as instructed 4 x daily 11/08/15   Cassandria Anger, MD  Insulin Pen Needle (B-D ULTRAFINE III SHORT PEN) 31G X 8 MM MISC 1 each by Does not apply route as directed. 09/02/15   Cassandria Anger, MD  Lancets MISC 1 each by Does not apply route 4 (four) times daily. 11/08/15   Cassandria Anger, MD  linaclotide Rolan Lipa) 290 MCG CAPS capsule Take 1 capsule (290 mcg total) by mouth daily before breakfast. Patient not taking: No sig reported 06/08/21   Bary Leriche, PA-C  midodrine (PROAMATINE) 10 MG tablet Take 0.5 tablets (5 mg total) by mouth every Monday, Wednesday, and Friday with hemodialysis. Patient not taking: Reported on 07/26/2021 06/08/21   Bary Leriche, PA-C  multivitamin (RENA-VIT) TABS tablet Take 1 tablet by mouth at bedtime. 06/08/21   Love, Ivan Anchors, PA-C  pantoprazole (PROTONIX) 40 MG tablet Take 1 tablet (40 mg total) by mouth daily. Patient not taking: No sig reported 05/18/21   Little Ishikawa, MD  pentoxifylline (TRENTAL) 400 MG CR tablet TAKE 1 TABLET BY MOUTH 3 TIMES DAILY WITH MEALS. 07/28/21   Newt Minion, MD  QUEtiapine (SEROQUEL) 50 MG tablet TAKE 1 TABLET BY MOUTH EVERYDAY AT BEDTIME 07/04/21   Lovorn, Megan, MD  senna-docusate (SENOKOT-S) 8.6-50 MG tablet Take 2 tablets by mouth 2 (two) times daily. Patient not taking: No sig reported 06/08/21   Love, Ivan Anchors, PA-C  tamsulosin (FLOMAX) 0.4 MG CAPS capsule Take 2 capsules (0.8 mg total) by mouth at bedtime. Patient not taking: No sig reported 06/08/21   Love, Ivan Anchors, PA-C  traMADol (ULTRAM) 50 MG tablet Take 1 tablet (50 mg total) by mouth every 6 (six) hours as needed for  moderate pain. Patient not taking: No sig reported 06/08/21   Bary Leriche, [place]     Vitals:   07/28/21 1232 07/28/21 1303 07/28/21 1342 07/28/21 1612  BP: 104/62 105/63 105/66 106/64  Pulse: 75 74 76 80  Resp: 12 12 16 18  Temp:  (!) 97.4 F (36.3 C) (!) 97.4 F (36.3 C) 97.7 F (36.5 C)  TempSrc:   Oral Oral  SpO2: 99% 100% (!) 84% 100%  Weight:      Height:       Exam Gen alert, no distress No rash, cyanosis or gangrene Sclera anicteric, throat clear  No jvd or bruits Chest clear bilat to bases, no rales/ wheezing RRR no MRG Abd soft ntnd no mass or ascites +bs GU normal MS no joint effusions or deformity Ext 1-2+ RLE and L hip edema, L BKA wrapped Neuro is alert, Ox 3 , nf         Home meds include lipitor, trental, asa, lipitor, auryxia 1 ac tid, sensipar 30 , klonopin 0.5 bid prn, neurontin 100 prn qd, insulin, midodrine 2.5 pre HD mwf, trazodone, effexor-xr, plavix, lasix 80 bid, linzess, prns'      OP HD: MWF [place] Dr Vish  - records pend   Assessment/ Plan: SP revision of L BKA - per ortho team ESRD - on HD MWF.  Last HD Wed. Next HD tomorrow, no urgent need for HD today.  BP/ volume - BP's are low 100's. Gets midodrine pre HD, will ^ to 5 mg. Has volume overload w/ bilat LE edema. Get vol down on HD as tol.  Anemia ckd - Hb 12, no esa needs MBD ckd - get records Hx TIA Hx R nephrectomy for TCC      Rob   MD 07/28/2021, 4:52 PM  Recent Labs  Lab 07/28/21 0728  HGB 12.2*   Recent Labs  Lab 07/28/21 0728  K 4.8  BUN 55*  CREATININE 5.20*    

## 2021-07-28 NOTE — Op Note (Signed)
07/28/2021  9:33 AM  PATIENT:  Maxwell Harmon    PRE-OPERATIVE DIAGNOSIS:  Osteomyelitis, Dehiscence Left Below Knee Amputation  POST-OPERATIVE DIAGNOSIS:  Same  PROCEDURE:  REVISION LEFT BELOW KNEE AMPUTATION  SURGEON:  Newt Minion, MD  PHYSICIAN ASSISTANT:None ANESTHESIA:   General  PREOPERATIVE INDICATIONS:  Maxwell Harmon is a  65 y.o. male with a diagnosis of Osteomyelitis, Dehiscence Left Below Knee Amputation who failed conservative measures and elected for surgical management.    The risks benefits and alternatives were discussed with the patient preoperatively including but not limited to the risks of infection, bleeding, nerve injury, cardiopulmonary complications, the need for revision surgery, among others, and the patient was willing to proceed.  OPERATIVE IMPLANTS: Praveena wound VAC  @ENCIMAGES @  OPERATIVE FINDINGS: The medial head of the gastrocnemius muscle was necrotic.  The soleus muscle belly was necrotic.  Anterior compartment was viable.  OPERATIVE PROCEDURE: Patient was brought the operating room and underwent a general anesthetic.  After adequate levels anesthesia were obtained patient's left lower extremity was prepped using DuraPrep draped into a sterile field a timeout was called.  An elliptical incision was made around the ulcerative tissue of the wound dehiscence.  This was carried sharply down to bone.  The distal centimeter of tibia and fibula were resected.  The anterior tibia was beveled.  There was extensive necrosis of the medial head of the gastrocnemius muscle and this was resected back to the origin of the muscle.  There was extensive necrosis of the soleus muscle and this was also resected back to viable margins.  The anterior compartment was also resected back to healthy viable muscle.  The remaining soft tissue that was viable left a flap that was essentially skin and fascia.  The wound was irrigated normal saline the vascular bundles were suture  ligated with 2-0 silk.  The flap was reapproximated with the superficial fascia and skin with 2-0 nylon.  The wound closed well without tension on the skin.  The Prevena customizable and Arnell Sieving form wound VAC was applied this was outlined with derma tack this had a good suction fit this was then covered with a stump shrinker.  The wound VAC had a good suction.  Patient was extubated taken the PACU in stable condition   DISCHARGE PLANNING:  Antibiotic duration: 24 hours IV antibiotics  Weightbearing: Nonweightbearing on the left  Pain medication: Opioid pathway  Dressing care/ Wound VAC: Continue wound VAC for 1 week  Ambulatory devices: Walker  Discharge to: Anticipate discharge to home when safe with therapy  Follow-up: In the office 1 week post operative.

## 2021-07-28 NOTE — Progress Notes (Signed)
Inpatient Diabetes Program Recommendations  AACE/ADA: New Consensus Statement on Inpatient Glycemic Control (2015)  Target Ranges:  Prepandial:   less than 140 mg/dL      Peak postprandial:   less than 180 mg/dL (1-2 hours)      Critically ill patients:  140 - 180 mg/dL   Lab Results  Component Value Date   GLUCAP 88 07/28/2021   HGBA1C 5.3 06/16/2021    Review of Glycemic Control Results for KRAVEN, CALK (MRN [id number]) as of 07/28/2021 14:19  Ref. Range 07/28/2021 06:29 07/28/2021 07:46 07/28/2021 09:19 07/28/2021 13:50  Glucose-Capillary Latest Ref Range: 70 - 99 mg/dL 79 90 95 88   Diabetes history: DM2 Outpatient Diabetes medications: Basaglar 15 units qhs, Humalog 0-10 units tid Current orders for Inpatient glycemic control: Semglee 10 units hs, Novolog 4 units tid, Novolog 0-15 units tid correction  Inpatient Diabetes Program Recommendations:   Please consider the following: -Decrease Semglee to 5 units q hs -Decrease Novolog meal coverage to 2 units tid if eats 50% -Decrease Novolog correction scale to 0-9 units tid  Thank you, Nani Gasser. Salvadore Valvano, RN, MSN, CDE  Diabetes Coordinator Inpatient Glycemic Control Team Team Pager [phone] (8am-5pm) 07/28/2021 2:24 PM

## 2021-07-28 NOTE — Progress Notes (Signed)
Maxwell Harmon is a 65 y.o. male patient admitted. Awake, alert - oriented  X 4 - no acute distress noted.  VSS - Blood pressure 106/64, pulse 80, temperature 97.7 F (36.5 C), temperature source Oral, resp. rate 18, height 5\' 11"  (1.803 m), weight 86.2 kg, SpO2 100 %.    IV in place, occlusive dsg intact without redness.Wound vac in place functioning well.  Orientation to room, and floor completed.  Admission INP armband ID verified with patient/family, and in place.   SR up x 2, fall assessment complete, with patient and family able to verbalize understanding of risk associated with falls, and verbalized understanding to call nsg before up out of bed.  Call light within reach, patient able to voice, and demonstrate understanding. No evidence of skin break down noted on exam.      Will cont to eval and treat per MD orders.  [name], RN 07/28/2021 7:01 PM

## 2021-07-28 NOTE — Progress Notes (Signed)
Orthopedic Tech Progress Note Patient Details:  JACOBIE STAMEY 03/16/1956 [id number] Vive Protocol BK has been ordered from [facility].  Patient ID: [name], male   DOB: June 08, 1956, 65 y.o.   MRN: [id number]  Jearld Lesch 07/28/2021, 2:41 PM

## 2021-07-28 NOTE — Interval H&P Note (Signed)
History and Physical Interval Note:  07/28/2021 7:06 AM  Maxwell Harmon  has presented today for surgery, with the diagnosis of Osteomyelitis, Dehiscence Left Below Knee Amputation.  The various methods of treatment have been discussed with the patient and family. After consideration of risks, benefits and other options for treatment, the patient has consented to  Procedure(s): REVISION LEFT BELOW KNEE AMPUTATION (Left) as a surgical intervention.  The patient's history has been reviewed, patient examined, no change in status, stable for surgery.  I have reviewed the patient's chart and labs.  Questions were answered to the patient's satisfaction.     Newt Minion

## 2021-07-28 NOTE — Progress Notes (Signed)
PHARMACY NOTE:  ANTIMICROBIAL RENAL DOSAGE ADJUSTMENT  Current antimicrobial regimen includes a mismatch between antimicrobial dosage and estimated renal function.  As per policy approved by the Pharmacy & Therapeutics and Medical Executive Committees, the antimicrobial dosage will be adjusted accordingly.  Current antimicrobial dosage:  Ancef 2gm IV Q8H x 2 doses post-op  Indication: Surgical prophylaxis  Renal Function:  Estimated Creatinine Clearance: 15.3 mL/min (A) (by C-G formula based on SCr of 5.2 mg/dL (H)). [x]      On intermittent HD, scheduled: MWF []      On CRRT    Antimicrobial dosage has been changed to:  D/C, no documented plan for HD today; adequately covered with pre-op dose   Maxwell Harmon, PharmD, BCPS, [place] 07/28/2021, 1:58 PM

## 2021-07-28 NOTE — Anesthesia Procedure Notes (Signed)
Anesthesia Regional Block: Adductor canal block   Pre-Anesthetic Checklist: , timeout performed,  Correct Patient, Correct Site, Correct Laterality,  Correct Procedure, Correct Position, site marked,  Risks and benefits discussed,  Surgical consent,  Pre-op evaluation,  At surgeon's request and post-op pain management  Laterality: Left  Prep: Maximum Sterile Barrier Precautions used, chloraprep       Needles:  Injection technique: Single-shot  Needle Type: Echogenic Stimulator Needle     Needle Length: 9cm  Needle Gauge: 22     Additional Needles:   Procedures:,,,, ultrasound used (permanent image in chart),,    Narrative:  Start time: 07/28/2021 8:15 AM End time: 07/28/2021 8:20 AM Injection made incrementally with aspirations every 5 mL.  Performed by: Personally  Anesthesiologist: Pervis Hocking, DO  Additional Notes: Monitors applied. No increased pain on injection. No increased resistance to injection. Injection made in 5cc increments. Good needle visualization. Patient tolerated procedure well.

## 2021-07-28 NOTE — Transfer of Care (Addendum)
Immediate Anesthesia Transfer of Care Note  Patient: Maxwell Harmon  Procedure(s) Performed: REVISION LEFT BELOW KNEE AMPUTATION (Left)  Patient Location: PACU  Anesthesia Type:General  Level of Consciousness: awake, drowsy, patient cooperative and responds to stimulation  Airway & Oxygen Therapy: Patient Spontanous Breathing and Patient connected to nasal cannula oxygen  Post-op Assessment: Report given to RN and Post -op Vital signs reviewed and stable  Post vital signs: Reviewed and stable  Last Vitals:  Vitals Value Taken Time  BP 114/70 07/28/21 0918  Temp    Pulse 90 07/28/21 0921  Resp 19 07/28/21 0921  SpO2 91 % 07/28/21 0921  Vitals shown include unvalidated device data.  Last Pain:  Vitals:   07/28/21 0654  TempSrc:   PainSc: 0-No pain         Complications: No notable events documented.

## 2021-07-28 NOTE — Anesthesia Postprocedure Evaluation (Signed)
Anesthesia Post Note  Patient: [name]  Procedure(s) Performed: REVISION LEFT BELOW KNEE AMPUTATION (Left)     Patient location during evaluation: PACU Anesthesia Type: Regional and General Level of consciousness: awake and alert, oriented and patient cooperative Pain management: pain level controlled Vital Signs Assessment: post-procedure vital signs reviewed and stable Respiratory status: spontaneous breathing, nonlabored ventilation and respiratory function stable Cardiovascular status: blood pressure returned to baseline and stable Postop Assessment: no apparent nausea or vomiting Anesthetic complications: no   No notable events documented.  Last Vitals:  Vitals:   07/28/21 0933 07/28/21 0948  BP: 114/69 111/66  Pulse: 87 86  Resp: 14 15  Temp:    SpO2: 100% 100%    Last Pain:  Vitals:   07/28/21 0948  TempSrc:   PainSc: [place]

## 2021-07-28 NOTE — Anesthesia Procedure Notes (Signed)
Anesthesia Regional Block: Popliteal block   Pre-Anesthetic Checklist: , timeout performed,  Correct Patient, Correct Site, Correct Laterality,  Correct Procedure, Correct Position, site marked,  Risks and benefits discussed,  Surgical consent,  Pre-op evaluation,  At surgeon's request and post-op pain management  Laterality: Left  Prep: Maximum Sterile Barrier Precautions used, chloraprep       Needles:  Injection technique: Single-shot  Needle Type: Echogenic Stimulator Needle     Needle Length: 9cm  Needle Gauge: 22     Additional Needles:   Procedures:,,,, ultrasound used (permanent image in chart),,    Narrative:  Start time: 07/28/2021 8:10 AM End time: 07/28/2021 8:15 AM Injection made incrementally with aspirations every 5 mL.  Performed by: Personally  Anesthesiologist: Pervis Hocking, DO  Additional Notes: Monitors applied. No increased pain on injection. No increased resistance to injection. Injection made in 5cc increments. Good needle visualization. Patient tolerated procedure well.

## 2021-07-28 NOTE — Telephone Encounter (Signed)
done

## 2021-07-29 ENCOUNTER — Encounter (HOSPITAL_COMMUNITY): Payer: Self-pay | Admitting: Orthopedic Surgery

## 2021-07-29 LAB — CBC
HCT: 27.6 % — ABNORMAL LOW (ref 39.0–52.0)
Hemoglobin: 8.2 g/dL — ABNORMAL LOW (ref 13.0–17.0)
MCH: 31.4 pg (ref 26.0–34.0)
MCHC: 29.7 g/dL — ABNORMAL LOW (ref 30.0–36.0)
MCV: 105.7 fL — ABNORMAL HIGH (ref 80.0–100.0)
Platelets: 116 10*3/uL — ABNORMAL LOW (ref 150–400)
RBC: 2.61 MIL/uL — ABNORMAL LOW (ref 4.22–5.81)
RDW: 16 % — ABNORMAL HIGH (ref 11.5–15.5)
WBC: 7.9 10*3/uL (ref 4.0–10.5)
nRBC: 0 % (ref 0.0–0.2)

## 2021-07-29 LAB — BASIC METABOLIC PANEL
Anion gap: 10 (ref 5–15)
BUN: 55 mg/dL — ABNORMAL HIGH (ref 8–23)
CO2: 30 mmol/L (ref 22–32)
Calcium: 8.3 mg/dL — ABNORMAL LOW (ref 8.9–10.3)
Chloride: 97 mmol/L — ABNORMAL LOW (ref 98–111)
Creatinine, Ser: 5.99 mg/dL — ABNORMAL HIGH (ref 0.61–1.24)
GFR, Estimated: 10 mL/min — ABNORMAL LOW (ref >60)
Glucose, Bld: 90 mg/dL (ref 70–99)
Potassium: 4.8 mmol/L (ref 3.5–5.1)
Sodium: 137 mmol/L (ref 135–145)

## 2021-07-29 LAB — GLUCOSE, CAPILLARY
Glucose-Capillary: 71 mg/dL (ref 70–99)
Glucose-Capillary: 74 mg/dL (ref 70–99)
Glucose-Capillary: 88 mg/dL (ref 70–99)
Glucose-Capillary: 125 mg/dL — ABNORMAL HIGH (ref 70–99)

## 2021-07-29 NOTE — Progress Notes (Signed)
Patient ID: Maxwell Harmon, male   DOB: 1956/08/28, 65 y.o.   MRN: [id number] Patient is postoperative day 1 revision left transtibial amputation.  There was muscle ischemia that was concerning for potential wound healing.  This was reviewed with the patient.  He is in dialysis today anticipate possible discharge on Monday after dialysis.  There is no drainage in the wound VAC canister.

## 2021-07-29 NOTE — Evaluation (Signed)
Physical Therapy Evaluation Patient Details Name: Maxwell Harmon MRN: [id number] DOB: 1956/07/09 Today's Date: 07/29/2021  History of Present Illness  Pt is a 65 y.o. 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 Pt with initial BKA on 7/8, and then L transtibial revision on 9/2. PMH of anxiety, DM type II, toe amputation R, depression, HTN, renal cancer with radical nephrectomy.  Clinical Impression  PTA, patient living with wife and required intermittent assistance for ADLs and mobility. Patient required minA for bed mobility and modA+2 for sit to stand and stand pivot transfer. Patient presents with weakness, decreased activity tolerance, impaired balance, and impaired functional mobility. Patient motivated to get stronger and become more independent. Patient will benefit from skilled PT services during acute stay to address listed deficits. Recommend [name] following discharge to maximize functional independence and safety prior to discharging home.        Recommendations for follow up therapy are one component of a multi-disciplinary discharge planning process, led by the attending physician.  Recommendations may be updated based on patient status, additional functional criteria and insurance authorization.  Follow Up Recommendations [name]    Equipment Recommendations  Other (comment) (TBD)    Recommendations for Other Services Rehab consult     Precautions / Restrictions Precautions Precautions: Fall Restrictions Weight Bearing Restrictions: Yes LLE Weight Bearing: Non weight bearing      Mobility  Bed Mobility Overal bed mobility: Needs Assistance Bed Mobility: Supine to Sit     Supine to sit: Min assist     General bed mobility comments: Min A to push up from bed, required HOB up and use of hand rail. Pt needing increased time and min verbal cues for scooting hips to sit safely EOB.    Transfers Overall transfer level: Needs assistance Equipment used: Rolling Callie Bunyard (2  wheeled) Transfers: Stand Pivot Transfers Sit to Stand: Mod assist;+2 physical assistance Stand pivot transfers: Mod assist;+2 physical assistance       General transfer comment: Required Mod A +2 for power up and needed assist to pivot R foot and guide down to sit in chair  Ambulation/Gait                Stairs            Wheelchair Mobility    Modified Rankin (Stroke Patients Only)       Balance Overall balance assessment: Needs assistance Sitting-balance support: No upper extremity supported;Feet supported Sitting balance-Leahy Scale: Fair Sitting balance - Comments: Sat EOB with no LOBs in static sitting   Standing balance support: Bilateral upper extremity supported Standing balance-Leahy Scale: Poor Standing balance comment: Needing external UE support with RW and +2 physical assist during transfer                             Pertinent Vitals/Pain Pain Assessment: Faces Faces Pain Scale: Hurts even more Pain Location: LLE Pain Descriptors / Indicators: Aching Pain Intervention(s): Monitored during session    Home Living Family/patient expects to be discharged to:: Private residence Living Arrangements: Spouse/significant other Available Help at Discharge: Family;Available PRN/intermittently Type of Home: House Home Access: Ramped entrance     Home Layout: One level Home Equipment: Shower seat;Cane - single point;Iliany Losier - 2 wheels;Wheelchair - manual Additional Comments: States his w/c is heavy and looking into lighter w/c for mobility.    Prior Function Level of Independence: Independent with assistive device(s)         Comments:  Rw for functional transfers. Wife assisting with dressing and transfers into showers     Hand Dominance   Dominant Hand: Right    Extremity/Trunk Assessment   Upper Extremity Assessment Upper Extremity Assessment: Defer to OT evaluation    Lower Extremity Assessment Lower Extremity  Assessment: LLE deficits/detail LLE Deficits / Details: Pt with wrappings on LLE. Able to lift LLE, able to straighten knee but limited due to pain LLE: Unable to fully assess due to pain    Cervical / Trunk Assessment Cervical / Trunk Assessment: Normal  Communication   Communication: No difficulties  Cognition Arousal/Alertness: Awake/alert Behavior During Therapy: Flat affect Overall Cognitive Status: Within Functional Limits for tasks assessed                                 General Comments: Pt able to follow multistep commands      General Comments General comments (skin integrity, edema, etc.): SpO2 95% on 3L at end of session.Pt reporting minimal SOB    Exercises     Assessment/Plan    PT Assessment Patient needs continued PT services  PT Problem List Decreased strength;Decreased activity tolerance;Decreased balance;Decreased mobility;Pain       PT Treatment Interventions DME instruction;Gait training;Functional mobility training;Therapeutic activities;Therapeutic exercise;Balance training;Patient/family education;Wheelchair mobility training    PT Goals (Current goals can be found in the Care Plan section)  Acute Rehab PT Goals Patient Stated Goal: to get stronger PT Goal Formulation: With patient Time For Goal Achievement: 08/12/21 Potential to Achieve Goals: Good    Frequency Min 3X/week   Barriers to discharge        Co-evaluation               AM-PAC PT "6 Clicks" Mobility  Outcome Measure Help needed turning from your back to your side while in a flat bed without using bedrails?: A Little Help needed moving from lying on your back to sitting on the side of a flat bed without using bedrails?: A Little Help needed moving to and from a bed to a chair (including a wheelchair)?: Total Help needed standing up from a chair using your arms (e.g., wheelchair or bedside chair)?: Total Help needed to walk in hospital room?: Total Help  needed climbing 3-5 steps with a railing? : Total 6 Click Score: 10    End of Session Equipment Utilized During Treatment: Gait belt;Oxygen Activity Tolerance: Patient tolerated treatment well Patient left: in chair;with call bell/phone within reach;with chair alarm set Nurse Communication: Mobility status PT Visit Diagnosis: Muscle weakness (generalized) (M62.81);Difficulty in walking, not elsewhere classified (R26.2);Pain Pain - Right/Left: Left Pain - part of body: Leg    Time: 1430-1500 PT Time Calculation (min) (ACUTE ONLY): 30 min   Charges:   PT Evaluation $PT Eval Moderate Complexity: 1 Mod PT Treatments $Therapeutic Activity: 8-22 mins        Tradarius Reinwald A. Gilford Rile PT, DPT Acute Rehabilitation Services Pager [phone] Office [phone]   Linna Hoff 07/29/2021, 4:50 PM

## 2021-07-29 NOTE — Evaluation (Addendum)
Occupational Therapy Evaluation Patient Details Name: Maxwell Harmon MRN: [id number] DOB: Aug 03, 1956 Today's Date: 07/29/2021   History of Present Illness Pt is a 65 y.o. 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 Pt with initial BKA on 7/8, and then L transtibial revision on 9/2. PMH of anxiety, DM type II, toe amputation R, depression, HTN, renal cancer with radical nephrectomy.   Clinical Impression   PTA, pt was living at home with his wife and performing ADLs and functional mobility with wife providing intermittent assistance as needed. Pt currently requiring set-up for UB ADLs, Min A for LB ADLs, and Mod A for functional mobility. Pt with decreased balance, functional strength, and activity tolerance limiting ability to complete ADLs safely and independently. Pt verbalized he would like additional rehab as he feels that he as "lost a lot of his strength". Due to pt motivation, need for intensive rehab, and current level of function, recommending [name] post d/c to reduce caregiver burden and increase independence. Pt would benefit from continued skilled OT to address safe functional mobility and ADL retraining.      Recommendations for follow up therapy are one component of a multi-disciplinary discharge planning process, led by the attending physician.  Recommendations may be updated based on patient status, additional functional criteria and insurance authorization.   Follow Up Recommendations  [name]    Equipment Recommendations  None recommended by OT    Recommendations for Other Services       Precautions / Restrictions Precautions Precautions: Fall Restrictions Weight Bearing Restrictions: Yes LLE Weight Bearing: Non weight bearing      Mobility Bed Mobility Overal bed mobility: Needs Assistance Bed Mobility: Supine to Sit     Supine to sit: Min assist     General bed mobility comments: Min A to push up from bed, required HOB up and use of hand rail. Pt needing increased time and  min verbal cues for scooting hips to sit safely EOB.    Transfers Overall transfer level: Needs assistance Equipment used: Rolling walker (2 wheeled) Transfers: Stand Pivot Transfers Sit to Stand: Mod assist;+2 physical assistance Stand pivot transfers: Mod assist;+2 physical assistance       General transfer comment: Required Mod A +2 for power up and needed assist to pivot R foot and guide down to sit in chair    Balance Overall balance assessment: Needs assistance Sitting-balance support: No upper extremity supported;Feet supported Sitting balance-Leahy Scale: Fair Sitting balance - Comments: Sat EOB with no LOBs in static sitting   Standing balance support: Bilateral upper extremity supported Standing balance-Leahy Scale: Poor Standing balance comment: Needing external UE support with RW and +2 physical assist during transfer                           ADL either performed or assessed with clinical judgement   ADL Overall ADL's : Needs assistance/impaired Eating/Feeding: Modified independent   Grooming: Set up;Sitting   Upper Body Bathing: Set up;Sitting   Lower Body Bathing: Minimal assistance;Sitting/lateral leans   Upper Body Dressing : Set up;Sitting   Lower Body Dressing: Minimal assistance;Sitting/lateral leans   Toilet Transfer: Stand-pivot;RW;Moderate assistance;+2 for physical assistance Toilet Transfer Details (indicate cue type and reason): Simulated with stand-pivot from bed to chair. Required Mod A for power up and needed assist to pivot R foot and guide down to sit in chair Toileting- Clothing Manipulation and Hygiene: Min guard;Sitting/lateral lean       Functional mobility during  ADLs: Moderate assistance General ADL Comments: Pt with decreased functional strength, activity tolerance, and balance.     Vision Baseline Vision/History: 1 Wears glasses Ability to See in Adequate Light: 0 Adequate Patient Visual Report: No change from  baseline       Perception     Praxis      Pertinent Vitals/Pain Pain Assessment: Faces Faces Pain Scale: Hurts even more Pain Location: LLE Pain Descriptors / Indicators: Aching Pain Intervention(s): Monitored during session     Hand Dominance Right   Extremity/Trunk Assessment Upper Extremity Assessment Upper Extremity Assessment: Generalized weakness   Lower Extremity Assessment Lower Extremity Assessment: Defer to PT evaluation LLE Deficits / Details: Pt with wrappings on LLE. Able to lift LLE, able to straighten knee but limited due to pain   Cervical / Trunk Assessment Cervical / Trunk Assessment: Normal   Communication Communication Communication: No difficulties   Cognition Arousal/Alertness: Awake/alert Behavior During Therapy: Flat affect Overall Cognitive Status: Within Functional Limits for tasks assessed                                 General Comments: Pt able to follow multistep commands   General Comments  SpO2 95% on 3L at end of session.Pt reporting minimal SOB    Exercises     Shoulder Instructions      Home Living Family/patient expects to be discharged to:: Private residence Living Arrangements: Spouse/significant other Available Help at Discharge: Family;Available PRN/intermittently Type of Home: House Home Access: Ramped entrance     Home Layout: One level     Bathroom Shower/Tub: Occupational psychologist: Standard Bathroom Accessibility: Yes How Accessible: Accessible via walker Home Equipment: Shower seat;Cane - single point;Walker - 2 wheels;Wheelchair - manual   Additional Comments: States his w/c is heavy and looking into lighter w/c for mobility.      Prior Functioning/Environment Level of Independence: Independent with assistive device(s)        Comments: Rw for functional transfers. Wife assisting with dressing and transfers into showers        OT Problem List: Decreased strength;Decreased  activity tolerance;Pain      OT Treatment/Interventions: Self-care/ADL training;Therapeutic exercise;Energy conservation;DME and/or AE instruction;Therapeutic activities;Patient/family education;Balance training    OT Goals(Current goals can be found in the care plan section) Acute Rehab OT Goals Patient Stated Goal: To go home OT Goal Formulation: With patient Time For Goal Achievement: 08/11/21 Potential to Achieve Goals: Good ADL Goals Pt Will Perform Grooming: sitting Pt Will Perform Lower Body Dressing: with set-up;sitting/lateral leans Pt Will Transfer to Toilet: with min assist;stand pivot transfer;bedside commode Pt Will Perform Toileting - Clothing Manipulation and hygiene: with min guard assist;sit to/from stand Pt Will Perform Tub/Shower Transfer: Shower transfer;shower seat;with min assist  OT Frequency: Min 2X/week   Barriers to D/C:            Co-evaluation              AM-PAC OT "6 Clicks" Daily Activity     Outcome Measure Help from another person eating meals?: None Help from another person taking care of personal grooming?: A Little Help from another person toileting, which includes using toliet, bedpan, or urinal?: A Little Help from another person bathing (including washing, rinsing, drying)?: A Little Help from another person to put on and taking off regular upper body clothing?: A Little Help from another person to put on and taking  off regular lower body clothing?: A Little 6 Click Score: 19   End of Session Equipment Utilized During Treatment: Rolling walker;Oxygen Nurse Communication: Mobility status;Need for lift equipment (Use Steady for transfers)  Activity Tolerance: Patient tolerated treatment well;No increased pain Patient left: in chair;with chair alarm set  OT Visit Diagnosis: Unsteadiness on feet (R26.81);Repeated falls (R29.6);Muscle weakness (generalized) (M62.81)                Time: 0298-4730 OT Time Calculation (min): 32  min Charges:  OT General Charges $OT Visit: 1 Visit OT Evaluation $OT Eval Moderate Complexity: 1 Mod  [name], OTS Acute Rehab Office: [phone]   Shardee Dieu 07/29/2021, 4:19 PM

## 2021-07-29 NOTE — Progress Notes (Signed)
[facility] Progress Note  Subjective: seen on HD, no c/o's. BP's soft / dropping and only able to get 1.5 L  Vitals:   07/29/21 1000 07/29/21 1025 07/29/21 1103 07/29/21 1224  BP: (!) 84/53 (!) 92/54 104/69 110/70  Pulse: 95 90 88 98  Resp:  18 18 19   Temp:  (!) 97.5 F (36.4 C) 98.3 F (36.8 C) 97.8 F (36.6 C)  TempSrc:  Oral Oral Oral  SpO2:  94%    Weight:  83.5 kg    Height:        Exam:  alert, nad   no jvd  Chest cta bilat  Cor reg no RG  Abd soft ntnd no ascites   Ext new L BKA wrapped, 1-2+ RLE edema   Alert, NF, ox3   LUA AVF+bruit        Home meds include lipitor, trental, asa, lipitor, auryxia 1 ac tid, sensipar 30 , klonopin 0.5 bid prn, neurontin 100 prn qd, insulin, midodrine 2.5 pre HD mwf, trazodone, effexor-xr, plavix, lasix 80 bid, linzess, prns'         OP HD: MWF [place] Dr Elita Quick  - records pend     Assessment/ Plan: SP revision of L BKA - per ortho team ESRD - on HD MWF. Plan HD today. HD time may be shortened due to high census/ staffing issues.   BP/ volume - BP's are low 100's. Has volume overload w/ bilat LE edema. Unable to pull much today d/t falling BP's. Will ^midodrine 10 mg bid.  Anemia ckd - Hb 12, no esa needs MBD ckd - get records Hx TIA Hx R nephrectomy for RCC    Rob Skyrah Krupp 07/29/2021, 3:02 PM   Recent Labs  Lab 07/28/21 0728 07/29/21 0314  K 4.8 4.8  BUN 55* 55*  CREATININE 5.20* 5.99*  CALCIUM  --  8.3*  HGB 12.2* 8.2*   Inpatient medications:  vitamin C  1,000 mg Oral Daily   aspirin EC  81 mg Oral Daily   atorvastatin  40 mg Oral Daily   Chlorhexidine Gluconate Cloth  6 each Topical Q0600   [START ON 07/31/2021] cinacalcet  30 mg Oral Weekly   clopidogrel  75 mg Oral Daily   docusate sodium  100 mg Oral Daily   ferric citrate  210 mg Oral TID with meals   furosemide  80 mg Oral BID   insulin aspart  0-15 Units Subcutaneous TID WC   insulin aspart  2 Units Subcutaneous TID WC    insulin glargine-yfgn  5 Units Subcutaneous QHS   [START ON 07/31/2021] midodrine  5 mg Oral Q M,W,F-HD   multivitamin  1 tablet Oral QHS   nutrition supplement (JUVEN)  1 packet Oral BID BM   pantoprazole  40 mg Oral Daily   pentoxifylline  400 mg Oral TID WC   traZODone  50 mg Oral QHS   venlafaxine XR  37.5 mg Oral QPM   zinc sulfate  220 mg Oral Daily    magnesium sulfate bolus IVPB     acetaminophen, alum & mag hydroxide-simeth, bisacodyl, clonazePAM, diphenhydrAMINE, gabapentin, guaiFENesin-dextromethorphan, hydrALAZINE, HYDROmorphone (DILAUDID) injection, labetalol, magnesium sulfate bolus IVPB, metoprolol tartrate, ondansetron, oxyCODONE, oxyCODONE, phenol, polyethylene glycol, potassium chloride, temazepam

## 2021-07-29 NOTE — Progress Notes (Signed)
PT Cancellation Note  Patient Details Name: Maxwell Harmon MRN: [id number] DOB: 1956/07/22   Cancelled Treatment:    Reason Eval/Treat Not Completed: Patient at procedure or test/unavailable;Patient not medically ready (Pt. off floor for HD all morning, when PT returns to room, NSG holds tx due to pt's BP being too low.  PT to f/u as able.)  Olegario Emberson A. Azriella Mattia, PT, DPT Acute Rehabilitation Services Office: [place] 07/29/2021, 11:33 AM

## 2021-07-29 NOTE — Progress Notes (Signed)
OT Cancellation Note  Patient Details Name: Maxwell Harmon MRN: [id number] DOB: May 30, 1956   Cancelled Treatment:    Reason Eval/Treat Not Completed: Patient at procedure or test/ unavailable (Pt currently recieving dialysis tx according to chart, will return as schedule allows.)  [name], OTS Acute Rehab Office: [phone]   [name] 07/29/2021, 7:50 AM

## 2021-07-30 LAB — BASIC METABOLIC PANEL
Anion gap: 8 (ref 5–15)
BUN: 42 mg/dL — ABNORMAL HIGH (ref 8–23)
CO2: 29 mmol/L (ref 22–32)
Calcium: 8.1 mg/dL — ABNORMAL LOW (ref 8.9–10.3)
Chloride: 96 mmol/L — ABNORMAL LOW (ref 98–111)
Creatinine, Ser: 5.1 mg/dL — ABNORMAL HIGH (ref 0.61–1.24)
GFR, Estimated: 12 mL/min — ABNORMAL LOW (ref >60)
Glucose, Bld: 90 mg/dL (ref 70–99)
Potassium: 4.4 mmol/L (ref 3.5–5.1)
Sodium: 133 mmol/L — ABNORMAL LOW (ref 135–145)

## 2021-07-30 LAB — GLUCOSE, CAPILLARY
Glucose-Capillary: 84 mg/dL (ref 70–99)
Glucose-Capillary: 83 mg/dL (ref 70–99)
Glucose-Capillary: 91 mg/dL (ref 70–99)
Glucose-Capillary: 99 mg/dL (ref 70–99)

## 2021-07-30 LAB — CBC
HCT: 27.6 % — ABNORMAL LOW (ref 39.0–52.0)
Hemoglobin: 8 g/dL — ABNORMAL LOW (ref 13.0–17.0)
MCH: 31 pg (ref 26.0–34.0)
MCHC: 29 g/dL — ABNORMAL LOW (ref 30.0–36.0)
MCV: 107 fL — ABNORMAL HIGH (ref 80.0–100.0)
Platelets: 104 10*3/uL — ABNORMAL LOW (ref 150–400)
RBC: 2.58 MIL/uL — ABNORMAL LOW (ref 4.22–5.81)
RDW: 15.9 % — ABNORMAL HIGH (ref 11.5–15.5)
WBC: 6.4 10*3/uL (ref 4.0–10.5)
nRBC: 0 % (ref 0.0–0.2)

## 2021-07-30 MED ORDER — MIDODRINE HCL 5 MG PO TABS
10.0000 mg | ORAL_TABLET | Freq: Two times a day (BID) | ORAL | Status: DC
  Administered 2021-07-30 – 2021-08-02 (×6): 10 mg via ORAL
  Filled 2021-07-30 (×7): qty 2

## 2021-07-30 NOTE — Progress Notes (Signed)
[facility] Progress Note  Subjective: seen in room, going home tomorrow he thinks  Vitals:   07/29/21 1734 07/29/21 2110 07/30/21 0619 07/30/21 0857  BP: 107/66 104/67 95/60 108/67  Pulse: 90 87 88 87  Resp: 18 16 17 18   Temp: 98 F (36.7 C) 97.7 F (36.5 C) 98.6 F (37 C) 97.7 F (36.5 C)  TempSrc: Oral Oral Oral Oral  SpO2: 91% 100% 97% 95%  Weight:      Height:        Exam:  alert, nad   no jvd  Chest cta bilat  Cor reg no RG  Abd soft ntnd no ascites   Ext new L BKA wrapped, 1-2+ RLE edema   Alert, NF, ox3   LUA AVF+bruit        Home meds include lipitor, trental, asa, lipitor, auryxia 1 ac tid, sensipar 30 , klonopin 0.5 bid prn, neurontin 100 prn qd, insulin, midodrine 2.5 pre HD mwf, trazodone, effexor-xr, plavix, lasix 80 bid, linzess, prns'         OP HD: MWF [place] Dr Elita Quick     Assessment/ Plan: SP revision of L BKA - per ortho team ESRD - on HD MWF. Had HD here Saturday. Next HD tomorrow am.  BP/ volume - BP's are low 100's. Has volume overload which is a chronic issue pt says (pt states "they may switch me to HD 4x per week"). Have ^'d midodrine to 10 bid, was 2.5 tiw pre hd. Hopefully can get a better UF tomorrow.  Anemia ckd - Hb 8's, trasnfuse prn MBD ckd - cont sensipar, binder Dispo - per pt is going home tomorrow    Kelly Splinter 07/30/2021, 11:41 AM   Recent Labs  Lab 07/29/21 0314 07/30/21 0657  K 4.8 4.4  BUN 55* 42*  CREATININE 5.99* 5.10*  CALCIUM 8.3* 8.1*  HGB 8.2* 8.0*    Inpatient medications:  vitamin C  1,000 mg Oral Daily   aspirin EC  81 mg Oral Daily   atorvastatin  40 mg Oral Daily   Chlorhexidine Gluconate Cloth  6 each Topical Q0600   [START ON 07/31/2021] cinacalcet  30 mg Oral Weekly   clopidogrel  75 mg Oral Daily   docusate sodium  100 mg Oral Daily   ferric citrate  210 mg Oral TID with meals   furosemide  80 mg Oral BID   insulin aspart  0-15 Units Subcutaneous TID WC   insulin  aspart  2 Units Subcutaneous TID WC   insulin glargine-yfgn  5 Units Subcutaneous QHS   [START ON 07/31/2021] midodrine  5 mg Oral Q M,W,F-HD   multivitamin  1 tablet Oral QHS   nutrition supplement (JUVEN)  1 packet Oral BID BM   pantoprazole  40 mg Oral Daily   pentoxifylline  400 mg Oral TID WC   traZODone  50 mg Oral QHS   venlafaxine XR  37.5 mg Oral QPM   zinc sulfate  220 mg Oral Daily    magnesium sulfate bolus IVPB     acetaminophen, alum & mag hydroxide-simeth, bisacodyl, clonazePAM, diphenhydrAMINE, gabapentin, guaiFENesin-dextromethorphan, hydrALAZINE, HYDROmorphone (DILAUDID) injection, labetalol, magnesium sulfate bolus IVPB, metoprolol tartrate, ondansetron, oxyCODONE, oxyCODONE, phenol, polyethylene glycol, potassium chloride, temazepam

## 2021-07-30 NOTE — PMR Pre-admission (Signed)
PMR Admission Coordinator Pre-Admission Assessment  Patient: Maxwell Harmon is an 65 y.o., male MRN: [id number] DOB: Mar 05, 1956 Height: _0  (180.3 cm) Weight: 83.5 kg  Insurance Information HMO: yes    PPO:      PCP:      IPA:      80/20:      OTHER:  PRIMARY: [employer] Managed      Policy#: [id number]      Subscriber: pt CM Name: Maxwell Harmon      Phone#: [phone]     Fax#: [phone] Pre-Cert#: [id number]      Employer: Adela Lank with [employer] called with approval on 9/27 for 10 days. Requests that we call her with admission date. Updates due 08/08/21. Benefits:  Phone #:      Name:  Maxwell Harmon Date: 10/03/2015 - 11/04/2021 Deductible: does not have one OOP Max: does not have one [name]: 80% coverage, 20% co-insurance SNF: 80% coverage, 20% co-insurance Outpatient: 80% coverage, 20% co-insurance; limited to 31 visits/cal yr (68 remaining) Home Health:  80% coverage, 20% co-insurance; limited to 40 visits/cal yr (40 remaining) DME: 80% coverage, 20% co-insurance Providers: In network   SECONDARY: Medicare A/B      Policy#: [id number]     Phone#:   Development worker, community:       Phone#:   [employer] Engineer, [employer]" for patients in Inpatient Rehabilitation Facilities with attached "Privacy Act [place] Records" was provided and verbally reviewed with: Family  Emergency Contact Information Contact Information     Name Relation Home Work Mobile   [place] Spouse   [phone]       Current Medical History  Patient Admitting Diagnosis: L BKA Stump revision  History of Present Illness: Pt is a 64 y.o. 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 Pt with initial BKA on 7/8, and then L transtibial revision on 9/2. PMH of anxiety, DM type II, toe amputation R, depression, HTN, renal cancer with radical nephrectomy.  [name] evaluations completed with recommendations for inpatient rehab consult.  Patient's medical record from [facility] has been reviewed by  the rehabilitation admission coordinator and physician.  Past Medical History  Past Medical History:  Diagnosis Date   Anemia of chronic disease    Anxiety    CAD (coronary artery disease)    Depression    Diabetes mellitus without complication (HCC)    ESRD (end stage renal disease) on dialysis ([place])    MWF in [place]   History of bleeding ulcers    History of TIAs    Hypertension    PAD (peripheral artery disease) (HCC)    Renal cancer, right ([place])    s/p right radical nephrectomy 01/08/19    Has the patient had major surgery during 100 days prior to admission? Yes  Family History   family history includes Cancer in his brother and father; Stroke in his father and mother.  Current Medications  Current Facility-Administered Medications:    acetaminophen (TYLENOL) tablet 325-650 mg, 325-650 mg, Oral, Q6H PRN, Newt Minion, MD   alum & mag hydroxide-simeth (MAALOX/MYLANTA) 200-200-20 MG/5ML suspension 15-30 mL, 15-30 mL, Oral, Q2H PRN, Newt Minion, MD   ascorbic acid (VITAMIN C) tablet 1,000 mg, 1,000 mg, Oral, Daily, Newt Minion, MD, 1,000 mg at 07/30/21 0768   aspirin EC tablet 81 mg, 81 mg, Oral, Daily, Newt Minion, MD, 81 mg at 07/30/21 0954   atorvastatin (LIPITOR) tablet 40 mg, 40 mg, Oral, Daily, Newt Minion,  MD, 40 mg at 07/30/21 0955   bisacodyl (DULCOLAX) EC tablet 5 mg, 5 mg, Oral, Daily PRN, Newt Minion, MD   Chlorhexidine Gluconate Cloth 2 % PADS 6 each, 6 each, Topical, Q0600, Roney Jaffe, MD, 6 each at 07/30/21 0609   [START ON 07/31/2021] cinacalcet (SENSIPAR) tablet 30 mg, 30 mg, Oral, Weekly, Newt Minion, MD   clonazePAM [place]) tablet 0.5 mg, 0.5 mg, Oral, BID PRN, Newt Minion, MD   clopidogrel (PLAVIX) tablet 75 mg, 75 mg, Oral, Daily, Newt Minion, MD, 75 mg at 07/30/21 3354   diphenhydrAMINE (BENADRYL) 12.5 MG/5ML elixir 12.5-25 mg, 12.5-25 mg, Oral, Q4H PRN, Newt Minion, MD   docusate sodium (COLACE) capsule 100 mg, 100  mg, Oral, Daily, Newt Minion, MD, 100 mg at 07/29/21 1121   ferric citrate (AURYXIA) tablet 210 mg, 210 mg, Oral, TID with meals, Newt Minion, MD, 210 mg at 07/30/21 0954   furosemide (LASIX) tablet 80 mg, 80 mg, Oral, BID, Newt Minion, MD, 80 mg at 07/30/21 0957   gabapentin (NEURONTIN) capsule 100 mg, 100 mg, Oral, Daily PRN, Newt Minion, MD   guaiFENesin-dextromethorphan (ROBITUSSIN DM) 100-10 MG/5ML syrup 15 mL, 15 mL, Oral, Q4H PRN, Newt Minion, MD   hydrALAZINE (APRESOLINE) injection 5 mg, 5 mg, Intravenous, Q20 Min PRN, Newt Minion, MD   HYDROmorphone (DILAUDID) injection 0.5-1 mg, 0.5-1 mg, Intravenous, Q4H PRN, Newt Minion, MD   insulin aspart (novoLOG) injection 0-15 Units, 0-15 Units, Subcutaneous, TID WC, Newt Minion, MD, 2 Units at 07/28/21 1857   insulin aspart (novoLOG) injection 2 Units, 2 Units, Subcutaneous, TID WC, Newt Minion, MD, 2 Units at 07/30/21 0955   insulin glargine-yfgn (SEMGLEE) injection 5 Units, 5 Units, Subcutaneous, QHS, Newt Minion, MD, 5 Units at 07/29/21 2108   labetalol (NORMODYNE) injection 10 mg, 10 mg, Intravenous, Q10 min PRN, Newt Minion, MD   magnesium sulfate IVPB 2 g 50 mL, 2 g, Intravenous, Daily PRN, Newt Minion, MD   metoprolol tartrate (LOPRESSOR) injection 2-5 mg, 2-5 mg, Intravenous, Q2H PRN, Newt Minion, MD   [START ON 07/31/2021] midodrine (PROAMATINE) tablet 5 mg, 5 mg, Oral, Q M,W,F-HD, Roney Jaffe, MD   multivitamin (RENA-VIT) tablet 1 tablet, 1 tablet, Oral, QHS, Newt Minion, MD, 1 tablet at 07/29/21 2108   nutrition supplement (JUVEN) (JUVEN) powder packet 1 packet, 1 packet, Oral, BID BM, Newt Minion, MD, 1 packet at 07/30/21 0958   ondansetron [employer] injection 4 mg, 4 mg, Intravenous, Q6H PRN, Newt Minion, MD   oxyCODONE (Oxy IR/ROXICODONE) immediate release tablet 10-15 mg, 10-15 mg, Oral, Q4H PRN, Newt Minion, MD, 10 mg at 07/30/21 0158   oxyCODONE (Oxy IR/ROXICODONE) immediate  release tablet 5-10 mg, 5-10 mg, Oral, Q4H PRN, Newt Minion, MD, 10 mg at 07/29/21 1233   pantoprazole (PROTONIX) EC tablet 40 mg, 40 mg, Oral, Daily, Newt Minion, MD, 40 mg at 07/30/21 0954   pentoxifylline (TRENTAL) CR tablet 400 mg, 400 mg, Oral, TID WC, Newt Minion, MD, 400 mg at 07/30/21 0954   phenol (CHLORASEPTIC) mouth spray 1 spray, 1 spray, Mouth/Throat, PRN, Newt Minion, MD   polyethylene glycol (MIRALAX / GLYCOLAX) packet 17 g, 17 g, Oral, Daily PRN, Newt Minion, MD   potassium chloride SA (KLOR-CON) CR tablet 20-40 mEq, 20-40 mEq, Oral, Daily PRN, Newt Minion, MD   temazepam (RESTORIL) capsule 15 mg, 15 mg,  Oral, QHS PRN,MR X 1, Newt Minion, MD   traZODone (DESYREL) tablet 50 mg, 50 mg, Oral, QHS, Newt Minion, MD, 50 mg at 07/29/21 2108   venlafaxine XR (EFFEXOR-XR) 24 hr capsule 37.5 mg, 37.5 mg, Oral, QPM, Newt Minion, MD, 37.5 mg at 07/29/21 1700   zinc sulfate capsule 220 mg, 220 mg, Oral, Daily, Newt Minion, MD, 220 mg at 07/30/21 7014  Patients Current Diet:  Diet Order             Diet renal with fluid restriction Fluid restriction: 1200 mL Fluid; Room service appropriate? Yes; Fluid consistency: Thin  Diet effective now                   Precautions / Restrictions Precautions Precautions: Fall Restrictions Weight Bearing Restrictions: Yes LLE Weight Bearing: Non weight bearing   Has the patient had 2 or more falls or a fall with injury in the past year? Yes  Prior Activity Level Limited Community (1-2x/wk): went out for appointments  Prior Functional Level Self Care: Did the patient need help bathing, dressing, using the toilet or eating? Needed some help  Indoor Mobility: Did the patient need assistance with walking from room to room (with or without device)? Needed some help  Stairs: Did the patient need assistance with internal or external stairs (with or without device)? Needed some help  Functional Cognition: Did the  patient need help planning regular tasks such as shopping or remembering to take medications? Needed some help  Patient Information Are you of Hispanic, Latino/a,or Spanish origin?: A. No, not of Hispanic, Latino/a, or Spanish origin What is your race?: A. White Do you need or want an interpreter to communicate with a doctor or health care staff?: 0. No  Patient's Response To:  Health Literacy and Transportation Is the patient able to respond to health literacy and transportation needs?: Yes Health Literacy - How often do you need to have someone help you when you read instructions, pamphlets, or other written material from your doctor or pharmacy?: Sometimes In the past 12 months, has lack of transportation kept you from medical appointments or from getting medications?: No In the past 12 months, has lack of transportation kept you from meetings, work, or from getting things needed for daily living?: No  Development worker, international aid / [place] Devices/Equipment: Wheelchair, Eyeglasses, CBG Meter, Bedside commode/3-in-1, Shower chair with back, Oxygen, Blood pressure cuff, Grab bars in shower Home Equipment: Shower seat, Radio producer - single point, Environmental consultant - 2 wheels, Wheelchair - manual  Prior Device Use: Indicate devices/aids used by the patient prior to current illness, exacerbation or injury? Manual wheelchair  Current Functional Level Cognition  Overall Cognitive Status: Within Functional Limits for tasks assessed Orientation Level: Oriented X4 General Comments: Pt able to follow multistep commands    Extremity Assessment (includes Sensation/Coordination)  Upper Extremity Assessment: Defer to OT evaluation  Lower Extremity Assessment: LLE deficits/detail LLE Deficits / Details: Pt with wrappings on LLE. Able to lift LLE, able to straighten knee but limited due to pain LLE: Unable to fully assess due to pain    ADLs  Overall ADL's : Needs assistance/impaired Eating/Feeding:  Modified independent Grooming: Set up, Sitting Upper Body Bathing: Set up, Sitting Lower Body Bathing: Minimal assistance, Sitting/lateral leans, Sit to/from stand Upper Body Dressing : Set up, Sitting Lower Body Dressing: Minimal assistance, Sitting/lateral leans, Sit to/from stand Toilet Transfer: Stand-pivot, RW, Moderate assistance, +2 for physical assistance Toilet Transfer Details (  indicate cue type and reason): Simulated with stand-pivot from bed to chair. Required Mod A for power up and needed assist to pivot R foot and guide down to sit in chair Toileting- Clothing Manipulation and Hygiene: Min guard, Sitting/lateral lean Functional mobility during ADLs: Moderate assistance General ADL Comments: Pt with decreased functional strength, activity tolerance, and balance.    Mobility  Overal bed mobility: Needs Assistance Bed Mobility: Supine to Sit Supine to sit: Min assist General bed mobility comments: Min A to push up from bed, required HOB up and use of hand rail. Pt needing increased time and min verbal cues for scooting hips to sit safely EOB.    Transfers  Overall transfer level: Needs assistance Equipment used: Rolling walker (2 wheeled) Transfers: Stand Pivot Transfers Sit to Stand: Mod assist, +2 physical assistance Stand pivot transfers: Mod assist, +2 physical assistance General transfer comment: Required Mod A +2 for power up and needed assist to pivot R foot and guide down to sit in chair    Ambulation / Gait / Stairs / Wheelchair Mobility       Posture / Balance Dynamic Sitting Balance Sitting balance - Comments: Sat EOB with no LOBs in static sitting Balance Overall balance assessment: Needs assistance Sitting-balance support: No upper extremity supported, Feet supported Sitting balance-Leahy Scale: Fair Sitting balance - Comments: Sat EOB with no LOBs in static sitting Standing balance support: Bilateral upper extremity supported Standing balance-Leahy Scale:  Poor Standing balance comment: Needing external UE support with RW and +2 physical assist during transfer    Special needs/care consideration Skin L BKA post op incision with VAC in place, Diabetic management Yes DM and on insulin in hospital, and Special service needs ESRD HD MWF ongoing   Previous Home Environment (from acute therapy documentation) Living Arrangements: Spouse/significant other Available Help at Discharge: Family, Available PRN/intermittently Type of Home: House Home Layout: One level Home Access: Ramped entrance Bathroom Shower/Tub: Multimedia programmer: Standard Bathroom Accessibility: Yes How Accessible: Accessible via walker Home Care Services: No Additional Comments: States his w/c is heavy and looking into lighter w/c for mobility.  Discharge Living Setting Plans for Discharge Living Setting: Patient's home Type of Home at Discharge: House Discharge Home Layout: One level Discharge Home Access: [place] entrance Discharge Bathroom Shower/Tub: Walk-in shower Discharge Bathroom Toilet: Standard Discharge Bathroom Accessibility: Yes How Accessible: Accessible via walker, Accessible via wheelchair Does the patient have any problems obtaining your medications?: No  Social/Family/Support Systems Patient Roles: Spouse Contact Information: [phone] Anticipated Caregiver: Margie Brink Anticipated Caregiver's Contact Information: [phone] Ability/Limitations of Caregiver: Min A Caregiver Availability: 24/7 Discharge Plan Discussed with Primary Caregiver: Yes Is Caregiver In Agreement with Plan?: Yes Does Caregiver/Family have Issues with Lodging/Transportation while Pt is in Rehab?: No  Goals Patient/Family Goal for Rehab: [name] min A wheelchair level Expected length of stay: 12-14 days Pt/Family Agrees to Admission and willing to participate: Yes Program Orientation Provided & Reviewed with Pt/Caregiver Including Roles  & Responsibilities:  Yes  Decrease burden of Care through IP rehab admission: Specialzed equipment needs  Possible need for SNF placement upon discharge: not anticipated  Patient Condition: I have reviewed medical records from [facility], spoken with CM, and patient. I met with patient at the bedside and discussed via phone for inpatient rehabilitation assessment.  Patient will benefit from ongoing PT and OT, can actively participate in 3 hours of therapy a day 5 days of the week, and can make measurable gains during the admission.  Patient will also benefit from the coordinated team approach during an Inpatient Acute Rehabilitation admission.  The patient will receive intensive therapy as well as Rehabilitation physician, nursing, social worker, and care management interventions.  Due to safety, skin/wound care, disease management, medication administration, pain management, and patient education the patient requires 24 hour a day rehabilitation nursing.  The patient is currently min to mod with mobility and up to Max A with basic ADLs.  Discharge setting and therapy post discharge at home with home health is anticipated.  Patient has agreed to participate in the Acute Inpatient Rehabilitation Program and will admit today.  Preadmission Screen Completed By:  Genella Mech, 07/30/2021 10:10 AM ______________________________________________________________________   Discussed status with Dr. Naaman Plummer on 08/03/21 at 0945 and received approval for admission today.  Admission Coordinator:  Genella Mech, CCC-SLP,  and updated by Karene Fry, RN time 1017/Date 08/03/21   Assessment/Plan: Diagnosis: left BKA revision  Does the need for close, 24 hr/day Medical supervision in concert with the patient's rehab needs make it unreasonable for this patient to be served in a less intensive setting? Yes Co-Morbidities requiring supervision/potential complications: DM2, anxiety, htn, renal cancer Due to bladder  management, bowel management, safety, skin/wound care, disease management, medication administration, pain management, and patient education, does the patient require 24 hr/day rehab nursing? Yes Does the patient require coordinated care of a physician, rehab nurse, PT, OT to address physical and functional deficits in the context of the above medical diagnosis(es)? Yes Addressing deficits in the following areas: balance, endurance, locomotion, strength, transferring, bowel/bladder control, bathing, dressing, feeding, grooming, toileting, and psychosocial support Can the patient actively participate in an intensive therapy program of at least 3 hrs of therapy 5 days a week? Yes The potential for patient to make measurable gains while on inpatient rehab is excellent Anticipated functional outcomes upon discharge from inpatient rehab: supervision and min assist PT, supervision and min assist OT, n/a SLP at w/c level Estimated rehab length of stay to reach the above functional goals is: 10-14 days Anticipated discharge destination: Home 10. Overall Rehab/Functional Prognosis: excellent   MD Signature: Meredith Staggers, MD, [place] [facility] 08/03/2021

## 2021-07-30 NOTE — Progress Notes (Signed)
Patient ID: Maxwell Harmon, male   DOB: [birth year], 65 y.o.   MRN: [id number] Patient is postoperative day 2 revision transtibial amputation.  Pain was moderate this morning.  Scheduled for dialysis on Monday.  Seen by physical therapy recommended inpatient rehab.  Physical examination his left wound dressing is clean and intact.  Wound VAC is in place with good suction  Assessment and plan: Pending case management assistance for placement given his mobility needs.  Work on pain control today.  Pending dialysis Monday.

## 2021-07-30 NOTE — Progress Notes (Signed)
Inpatient Rehab Admissions Coordinator:   I spoke with pt.'s wife Angie about potential [name] admission. Pt.  Was on [name] a few months ago after his initial amputation. Pt's wife states that she would like Pt.to come back and she is able to provide 24/7 support at d/c. I will discuss with rehab MD and see if we can offer a bed.  Clemens Catholic, [place], [place] Admissions Coordinator  [phone] ([place]) [phone] (office)

## 2021-07-31 LAB — RENAL FUNCTION PANEL
Albumin: 2.4 g/dL — ABNORMAL LOW (ref 3.5–5.0)
Anion gap: 9 (ref 5–15)
BUN: 29 mg/dL — ABNORMAL HIGH (ref 8–23)
CO2: 31 mmol/L (ref 22–32)
Calcium: 8.4 mg/dL — ABNORMAL LOW (ref 8.9–10.3)
Chloride: 96 mmol/L — ABNORMAL LOW (ref 98–111)
Creatinine, Ser: 3.69 mg/dL — ABNORMAL HIGH (ref 0.61–1.24)
GFR, Estimated: 18 mL/min — ABNORMAL LOW (ref >60)
Glucose, Bld: 86 mg/dL (ref 70–99)
Phosphorus: 4.8 mg/dL — ABNORMAL HIGH (ref 2.5–4.6)
Potassium: 4 mmol/L (ref 3.5–5.1)
Sodium: 136 mmol/L (ref 135–145)

## 2021-07-31 LAB — CBC
HCT: 28.8 % — ABNORMAL LOW (ref 39.0–52.0)
Hemoglobin: 8.7 g/dL — ABNORMAL LOW (ref 13.0–17.0)
MCH: 31.9 pg (ref 26.0–34.0)
MCHC: 30.2 g/dL (ref 30.0–36.0)
MCV: 105.5 fL — ABNORMAL HIGH (ref 80.0–100.0)
Platelets: 122 10*3/uL — ABNORMAL LOW (ref 150–400)
RBC: 2.73 MIL/uL — ABNORMAL LOW (ref 4.22–5.81)
RDW: 15.6 % — ABNORMAL HIGH (ref 11.5–15.5)
WBC: 6 10*3/uL (ref 4.0–10.5)
nRBC: 0 % (ref 0.0–0.2)

## 2021-07-31 LAB — GLUCOSE, CAPILLARY
Glucose-Capillary: 75 mg/dL (ref 70–99)
Glucose-Capillary: 80 mg/dL (ref 70–99)
Glucose-Capillary: 84 mg/dL (ref 70–99)
Glucose-Capillary: 109 mg/dL — ABNORMAL HIGH (ref 70–99)

## 2021-07-31 MED ORDER — HYDROMORPHONE HCL 1 MG/ML IJ SOLN
INTRAMUSCULAR | Status: AC
  Administered 2021-07-31: 0.5 mg via INTRAVENOUS
  Filled 2021-07-31: qty 0.5

## 2021-07-31 NOTE — Progress Notes (Signed)
Patient ID: Maxwell Harmon, male   DOB: 01/17/1956, 65 y.o.   MRN: [id number] Patient seen in dialysis this morning.  Patient is postoperative day 3 revision transtibial amputation.  There is no drainage in the wound VAC canister.  Patient to be evaluated for insurance authorization for inpatient rehab.  Patient was in inpatient rehab after his initial surgery.

## 2021-07-31 NOTE — Procedures (Signed)
Patient seen on Hemodialysis-comfortable and clinically stable. BP 98/60   Pulse 86   Temp 98.3 F (36.8 C) (Oral)   Resp 16   Ht 5\' 11"  (1.803 m)   Wt 85.7 kg   SpO2 96%   BMI 26.35 kg/m   QB 400, UF goal 3L Tolerating treatment without complaints of surgical site pain at this time.   Elmarie Shiley MD [facility]. Office # [phone] Pager # [phone] 10:29 AM

## 2021-07-31 NOTE — Procedures (Addendum)
Inpatient Rehab Admissions Coordinator:   I do not have a bed for this Pt.on [name] today. I will follow for potential admit pending medical readiness, insurance auth, and bed availability.  Clemens Catholic, [place], [place] Admissions Coordinator  [phone] ([place]) [phone] (office)

## 2021-08-01 LAB — GLUCOSE, CAPILLARY
Glucose-Capillary: 98 mg/dL (ref 70–99)
Glucose-Capillary: 142 mg/dL — ABNORMAL HIGH (ref 70–99)
Glucose-Capillary: 113 mg/dL — ABNORMAL HIGH (ref 70–99)

## 2021-08-01 MED ORDER — DARBEPOETIN ALFA 200 MCG/0.4ML IJ SOSY
200.0000 ug | PREFILLED_SYRINGE | INTRAMUSCULAR | Status: DC
  Administered 2021-08-02: 200 ug via INTRAVENOUS
  Filled 2021-08-01 (×2): qty 0.4

## 2021-08-01 NOTE — Progress Notes (Signed)
Physical Therapy Treatment Patient Details Name: FABRICE DYAL MRN: [id number] DOB: [birth year] Today's Date: 08/01/2021   History of Present Illness Pt is a 65 y.o. 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 Pt with initial BKA on 7/8, and then L transtibial revision on 9/2. PMH of anxiety, DM type II, toe amputation R, depression, HTN, renal cancer with radical nephrectomy.    PT Comments    Patient received in bed, pleasant but needed a lot of encouragement throughout session today. Placed geopad in chair to reduce pressure on chronic sacral ulcer. Continues to require ModAx2 to pivot over to recliner, unable to really progress to hopping today so had to carefully pivot on R LE however ended up trying to sit prematurely and needed quite a bit of assist to control descent into chair. RW was a bit tall, so lowered it once notch and he was able to then perform 1x sit to stand from recliner with modAx1 and cues for good technique. Left up in recliner with all needs met, chair alarm active. Will continue efforts, continue to feel he will benefit from intensive therapies in [name] setting.    Recommendations for follow up therapy are one component of a multi-disciplinary discharge planning process, led by the attending physician.  Recommendations may be updated based on patient status, additional functional criteria and insurance authorization.  Follow Up Recommendations  [name]     Equipment Recommendations  Other (comment) (TBD)    Recommendations for Other Services       Precautions / Restrictions Precautions Precautions: Fall Restrictions Weight Bearing Restrictions: Yes LLE Weight Bearing: Non weight bearing     Mobility  Bed Mobility Overal bed mobility: Needs Assistance Bed Mobility: Supine to Sit     Supine to sit: Min assist;HOB elevated     General bed mobility comments: MinA to scoot hips around to square up at EOB, increased time and cuing for technique but steady once fully at EOB     Transfers Overall transfer level: Needs assistance Equipment used: Rolling walker (2 wheeled) Transfers: Sit to/from [facility] Sit to Stand: Mod assist;+2 physical assistance;From elevated surface Stand pivot transfers: Mod assist;+2 physical assistance       General transfer comment: ModAx2 to power up from EOB even with elevated surface and to pivot over to chair; actually able to boost up to standing with heavy ModAx1 from recliner with RW lowered one notch. Unable to really hop yet and had to pivot hips/foot around, was premature when going from stand to sit when finishing pivot  Ambulation/Gait             General Gait Details: unable   Stairs             Wheelchair Mobility    Modified Rankin (Stroke Patients Only)       Balance Overall balance assessment: Needs assistance Sitting-balance support: No upper extremity supported;Feet supported Sitting balance-Leahy Scale: Fair Sitting balance - Comments: Sat EOB with no LOBs in static sitting, would likely struggle to maintain balance when challenged dynamically   Standing balance support: Bilateral upper extremity supported Standing balance-Leahy Scale: Poor Standing balance comment: Needing external UE support with RW and +2 physical assist during transfer                            Cognition Arousal/Alertness: Awake/alert Behavior During Therapy: Flat affect Overall Cognitive Status: Within Functional Limits for tasks assessed  General Comments: Pt able to follow multistep commands, did need lots of encouragement for OOB activity today      Exercises      General Comments General comments (skin integrity, edema, etc.): VSS during session on supplemental O2      Pertinent Vitals/Pain Pain Assessment: Faces Faces Pain Scale: Hurts little more Pain Location: LLE Pain Descriptors / Indicators: Aching;Discomfort Pain  Intervention(s): Limited activity within patient's tolerance;Monitored during session    Home Living                      Prior Function            PT Goals (current goals can now be found in the care plan section) Acute Rehab PT Goals Patient Stated Goal: to get stronger PT Goal Formulation: With patient Time For Goal Achievement: 08/12/21 Potential to Achieve Goals: Good Progress towards PT goals: Progressing toward goals    Frequency    Min 3X/week      PT Plan Current plan remains appropriate    Co-evaluation              AM-PAC PT "6 Clicks" Mobility   Outcome Measure  Help needed turning from your back to your side while in a flat bed without using bedrails?: A Little Help needed moving from lying on your back to sitting on the side of a flat bed without using bedrails?: A Little Help needed moving to and from a bed to a chair (including a wheelchair)?: Total Help needed standing up from a chair using your arms (e.g., wheelchair or bedside chair)?: A Lot Help needed to walk in hospital room?: Total Help needed climbing 3-5 steps with a railing? : Total 6 Click Score: 11    End of Session Equipment Utilized During Treatment: Gait belt;Oxygen Activity Tolerance: Patient tolerated treatment well Patient left: in chair;with call bell/phone within reach;with chair alarm set;Other (comment) (geopad in chair) Nurse Communication: Mobility status PT Visit Diagnosis: Muscle weakness (generalized) (M62.81);Difficulty in walking, not elsewhere classified (R26.2);Pain Pain - Right/Left: Left Pain - part of body: Leg     Time: 7948-0165 PT Time Calculation (min) (ACUTE ONLY): 21 min  Charges:  $Therapeutic Activity: 8-22 mins                    Windell Norfolk, DPT, PN2   Supplemental Physical Therapist Will    Pager [phone] Acute Rehab Office [phone]

## 2021-08-01 NOTE — Progress Notes (Signed)
OT Cancellation Note  Patient Details Name: SOLLY DERASMO MRN: [id number] DOB: 07/08/1956   Cancelled Treatment:    Reason Eval/Treat Not Completed: Other (comment) (On the phone. agreable to participate in therapy later this afternoon. Will return as schedule allows.)  [place], OTR/L Acute Rehab Pager: [phone] Office: [phone] 08/01/2021, 2:58 PM

## 2021-08-01 NOTE — Progress Notes (Signed)
Inpatient Rehab Admissions Coordinator:   I opened a case with pt.'s insurance yesterday for [name] admit and await a response. I will follow for potential [name] admit pending insurance auth, bed availability.   Clemens Catholic, [place], [place] Admissions Coordinator  [phone] ([place]) [phone] (office)

## 2021-08-01 NOTE — Progress Notes (Signed)
Patient ID: Maxwell Harmon, male   DOB: [birth year], 65 y.o.   MRN: [id number]  [facility] Progress Note   Assessment/ Plan:   1.  Status post revision of left below-knee amputation: With intermittent breakthrough pain ongoing management as directed by orthopedic surgery.  Unfortunately not able to get SNF bed yesterday and now looking towards possible admission to [name]. 2. ESRD: He is on a MWF schedule as an outpatient and got his hemodialysis yesterday with next dialysis ordered again for tomorrow.  Continue efforts at ultrafiltration with evidence of volume excess noted on physical exam. 3. Anemia: Hemoglobin levels low, will reorder darbepoetin with dialysis tomorrow. 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 5. Nutrition: Ongoing renal diet with protein supplementation and renal multivitamin.  Fluid restriction. 6. Hypertension: With episodes of intradialytic hypotension for which she is on as needed midodrine  Subjective:   Reports intermittent breakthrough pain over his left BKA site and feels that his "pain medication might be too strong because his mental status appears affected".   Objective:   BP 100/64 (BP Location: Right Arm)   Pulse 85   Temp 98.3 F (36.8 C) (Oral)   Resp 16   Ht 5\' 11"  (1.803 m)   Wt 82.6 kg   SpO2 95%   BMI 25.40 kg/m   Physical Exam: Gen: Appears comfortable resting in bed CVS: Pulse regular rhythm, normal rate, S1 and S2 normal Resp: Clear to auscultation bilaterally, no rales/rhonchi Abd: Soft, obese, nontender, bowel sounds normal Ext: Left BKA, 1-2+ right lower extremity edema.  Left upper arm AV fistula with positive thrill  Labs: BMET Recent Labs  Lab 07/28/21 0728 07/29/21 0314 07/30/21 0657 07/31/21 1158  NA 136 137 133* 136  K 4.8 4.8 4.4 4.0  CL 97* 97* 96* 96*  CO2  --  30 29 31   GLUCOSE 89 90 90 86  BUN 55* 55* 42* 29*  CREATININE 5.20* 5.99* 5.10*  3.69*  CALCIUM  --  8.3* 8.1* 8.4*  PHOS  --   --   --  4.8*   CBC Recent Labs  Lab 07/28/21 0728 07/29/21 0314 07/30/21 0657 07/31/21 1158  WBC  --  7.9 6.4 6.0  HGB 12.2* 8.2* 8.0* 8.7*  HCT 36.0* 27.6* 27.6* 28.8*  MCV  --  105.7* 107.0* 105.5*  PLT  --  116* 104* 122*      Medications:     vitamin C  1,000 mg Oral Daily   aspirin EC  81 mg Oral Daily   atorvastatin  40 mg Oral Daily   Chlorhexidine Gluconate Cloth  6 each Topical Q0600   cinacalcet  30 mg Oral Weekly   clopidogrel  75 mg Oral Daily   docusate sodium  100 mg Oral Daily   ferric citrate  210 mg Oral TID with meals   furosemide  80 mg Oral BID   insulin aspart  0-15 Units Subcutaneous TID WC   insulin aspart  2 Units Subcutaneous TID WC   insulin glargine-yfgn  5 Units Subcutaneous QHS   midodrine  10 mg Oral BID WC   multivitamin  1 tablet Oral QHS   nutrition supplement (JUVEN)  1 packet Oral BID BM   pantoprazole  40 mg Oral Daily   pentoxifylline  400 mg Oral TID WC   traZODone  50 mg Oral QHS   venlafaxine XR  37.5 mg Oral QPM   zinc sulfate  220  mg Oral Daily     Elmarie Shiley, MD 08/01/2021, 11:19 AM

## 2021-08-01 NOTE — Progress Notes (Signed)
Patient ID: Maxwell Harmon, male   DOB: [birth year], 65 y.o.   MRN: [id number] There was not a bed available in inpatient rehab yesterday.  There is no drainage in the wound VAC canister.  Patient states he still has some pain as the pain medicine wears off.  Patient wants to proceed with inpatient rehab therapy.

## 2021-08-01 NOTE — Progress Notes (Addendum)
Occupational Therapy Treatment Patient Details Name: Maxwell Harmon MRN: [id number] DOB: 1956-08-19 Today's Date: 08/01/2021   History of present illness Pt is a 65 y.o. 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 Pt with initial BKA on 7/8, and then L transtibial revision on 9/2. PMH of anxiety, DM type II, toe amputation R, depression, HTN, renal cancer with radical nephrectomy.   OT comments  Returned for second visit due to pt expressing motivation to participate in UE exercises outside of therapy to increase strength. Focused treatment session on UE strengthening exercises with green Theraband (see exercises below). Pt demonstrating UE exercises with min verbal cues for correct form, demonstrating good understanding of each. Pt given exercise plan/handout with pictures and instructions for each exercise. Continuing to recommend [name], will continue to follow in the acute setting.   Recommendations for follow up therapy are one component of a multi-disciplinary discharge planning process, led by the attending physician.  Recommendations may be updated based on patient status, additional functional criteria and insurance authorization.    Follow Up Recommendations  [name]    Equipment Recommendations  None recommended by OT    Recommendations for Other Services      Precautions / Restrictions Precautions Precautions: Fall Restrictions Weight Bearing Restrictions: Yes LLE Weight Bearing: Non weight bearing       Mobility Bed Mobility    General bed mobility comments: Focused pt session on UE exercise education to increase functional strength.    Transfers    General transfer comment: Focused pt session on UE exercise education to increase functional strength    Balance                            ADL either performed or assessed with clinical judgement   ADL Overall ADL's : Needs assistance/impaired                          General ADL Comments: Focused pt  session on UE exercise education to increase functional strength. Pt expressed motivation to complete UE exercises. Pt given exercise plan and handout, recommended completing exercises 2-3 times daily.     Vision       Perception     Praxis      Cognition Arousal/Alertness: Awake/alert Behavior During Therapy: Flat affect Overall Cognitive Status: Within Functional Limits for tasks assessed                                 General Comments: Able to follow multistep commands        Exercises Exercises: General Upper Extremity;Other exercises General Exercises - Upper Extremity Shoulder Flexion: AROM;Strengthening;Theraband;10 reps;Both;Seated (With HOB raised) Theraband Level (Shoulder Flexion): Level 3 (Green) Shoulder Extension: AROM;Strengthening;Both;10 reps;Seated (HOB raised) Shoulder Horizontal ABduction: AROM;Strengthening;Both;10 reps;Supine;Theraband (HOB raised) Theraband Level (Shoulder Horizontal Abduction): Level 3 (Green) Elbow Flexion: Both;10 reps;Seated;AROM;Strengthening;Theraband (HOB elevated) Theraband Level (Elbow Flexion): Level 3 (Green) Elbow Extension: AROM;Both;Strengthening;10 reps;Seated;Theraband (HOB raised) Theraband Level (Elbow Extension): Level 3 (Green) Other Exercises Other Exercises: Scapular protraction; AROM;strengthening; 10 repetitions;both;Theraband. (green theraband)   Shoulder Instructions       General Comments SpO2 86% at rest, likely innaccurate due to delay in reading and pt with no SOB on 2L. Attempted multiple times with no reading.    Pertinent Vitals/ Pain       Pain Assessment: Faces Faces Pain Scale: No hurt  Pain Location: LLE Pain Descriptors / Indicators: Aching;Discomfort Pain Intervention(s): Monitored during session  Home Living                                          Prior Functioning/Environment              Frequency  Min 2X/week        Progress Toward  Goals  OT Goals(current goals can now be found in the care plan section)  Progress towards OT goals: Progressing toward goals  Acute Rehab OT Goals Patient Stated Goal: to get stronger OT Goal Formulation: With patient Time For Goal Achievement: 08/11/21 Potential to Achieve Goals: Good ADL Goals Pt Will Perform Grooming: sitting Pt Will Perform Lower Body Bathing: with min guard assist;sit to/from stand Pt Will Perform Lower Body Dressing: with set-up;sitting/lateral leans Pt Will Transfer to Toilet: with min assist;stand pivot transfer;bedside commode Pt Will Perform Toileting - Clothing Manipulation and hygiene: with min guard assist;sit to/from stand Pt Will Perform Tub/Shower Transfer: Shower transfer;shower seat;with min assist  Plan Discharge plan remains appropriate    Co-evaluation                 AM-PAC OT "6 Clicks" Daily Activity     Outcome Measure   Help from another person eating meals?: None Help from another person taking care of personal grooming?: A Little Help from another person toileting, which includes using toliet, bedpan, or urinal?: A Lot Help from another person bathing (including washing, rinsing, drying)?: A Lot Help from another person to put on and taking off regular upper body clothing?: A Lot Help from another person to put on and taking off regular lower body clothing?: A Lot 6 Click Score: 15    End of Session Equipment Utilized During Treatment: Oxygen  OT Visit Diagnosis: Unsteadiness on feet (R26.81);Repeated falls (R29.6);Muscle weakness (generalized) (M62.81)   Activity Tolerance Patient tolerated treatment well   Patient Left in bed;with call bell/phone within reach;with bed alarm set   Nurse Communication Mobility status;Other (comment)        Time: 8413-2440 OT Time Calculation (min): 11 min  Charges: OT General Charges $OT Visit: 1 Visit OT Treatments $Therapeutic Exercise: 8-22 mins  [name], OTS Acute  Rehab Office: [phone]   Maxwell Harmon 08/01/2021, 5:19 PM

## 2021-08-01 NOTE — Progress Notes (Signed)
In and out cath : 40mL

## 2021-08-01 NOTE — Progress Notes (Addendum)
Occupational Therapy Treatment Patient Details Name: Maxwell Harmon MRN: [id number] DOB: 11/21/1955 Today's Date: 08/01/2021   History of present illness Pt is a 65 y.o. 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 Pt with initial BKA on 7/8, and then L transtibial revision on 9/2. PMH of anxiety, DM type II, toe amputation R, depression, HTN, renal cancer with radical nephrectomy.   OT comments  Pt with progress towards goals this session. Pt completed sit <> stand with Max A +2 and steady equipment from recliner to bed. Pt with flat affect this session and expressing he feels he is not making progress and feelings of being a burden to his family. Pt expressed appreciation for therapist active listening. Despite reported residual limb pain, pt motivated to participate in therapy session. Continuing to recommend [name] to decrease caregiver burden, increase safety, and address performance problems. Will continue to follow in the acute setting.   Recommendations for follow up therapy are one component of a multi-disciplinary discharge planning process, led by the attending physician.  Recommendations may be updated based on patient status, additional functional criteria and insurance authorization.    Follow Up Recommendations  [name]    Equipment Recommendations  None recommended by OT    Recommendations for Other Services      Precautions / Restrictions Precautions Precautions: Fall Restrictions Weight Bearing Restrictions: Yes LLE Weight Bearing: Non weight bearing       Mobility Bed Mobility Overal bed mobility: Needs Assistance Bed Mobility: Supine to Sit;Sit to Supine     Supine to sit: Min assist;HOB elevated Sit to supine: Min assist;HOB elevated   General bed mobility comments: Min A for guiding trunk into supine    Transfers Overall transfer level: Needs assistance Equipment used: Stedy Transfers: Sit to/from Stand Sit to Stand: Max assist;+2 physical assistance Stand pivot  transfers: Max assist;+2 physical assistance    General transfer comment: Max A +2 to sit to stand for power up and to shift hips forward to place steady. Second sit to stand Max A +2 for power up, shifting hips forward, and guiding into sitting.       Balance Overall balance assessment: Needs assistance Sitting-balance support: No upper extremity supported;Feet supported Sitting balance-Leahy Scale: Fair Sitting balance - Comments: Fair balance with static sitting.   Standing balance support: Bilateral upper extremity supported Standing balance-Leahy Scale: Poor Standing balance comment: Needing external UE support with Stedy and +2 physical assist during transfer                           ADL either performed or assessed with clinical judgement   ADL Overall ADL's : Needs assistance/impaired                         Toilet Transfer: Stand-pivot;+2 for physical assistance;+2 for safety/equipment;Maximal assistance Toilet Transfer Details (indicate cue type and reason): Stand pivot recliner to bed with Stedy equipment with Max A +2 for power up and shifting hips forward into standing.         Functional mobility during ADLs: Maximal assistance;+2 for physical assistance;+2 for safety/equipment General ADL Comments: Pt with continued decreased functional strength, activity tolerance, and balance. Pt requiring 2 attempts to stand, unable to shift hips forward and pivot to EOB after power up with Max A +2. Able  to sit <> stand and transfer to sit EOB with stedy equipment.     Vision  Perception     Praxis      Cognition Arousal/Alertness: Awake/alert Behavior During Therapy: Flat affect Overall Cognitive Status: Within Functional Limits for tasks assessed                                 General Comments: Pt with flat affect this session, with continued reports about being a lot weaker than he was and expressed feelings about being a  burden to his wife. Discussed option for chaplain referral. Pt verbalized he would like a chaplain visit.        Exercises     Shoulder Instructions       General Comments SpO2 86% at rest, likely innaccurate due to delay in reading and pt with no SOB on 2L. Attempted multiple times with no reading.    Pertinent Vitals/ Pain       Pain Assessment: Faces Faces Pain Scale: Hurts little more Pain Location: LLE Pain Descriptors / Indicators: Aching;Discomfort Pain Intervention(s): Limited activity within patient's tolerance;Monitored during session  Home Living                                          Prior Functioning/Environment              Frequency  Min 2X/week        Progress Toward Goals  OT Goals(current goals can now be found in the care plan section)  Progress towards OT goals: Progressing toward goals  Acute Rehab OT Goals Patient Stated Goal: to get stronger OT Goal Formulation: With patient Time For Goal Achievement: 08/11/21 Potential to Achieve Goals: Good ADL Goals Pt Will Perform Grooming: sitting Pt Will Perform Lower Body Bathing: with min guard assist;sit to/from stand Pt Will Perform Lower Body Dressing: with set-up;sitting/lateral leans Pt Will Transfer to Toilet: with min assist;stand pivot transfer;bedside commode Pt Will Perform Toileting - Clothing Manipulation and hygiene: with min guard assist;sit to/from stand Pt Will Perform Tub/Shower Transfer: Shower transfer;shower seat;with min assist  Plan Discharge plan remains appropriate    Co-evaluation                 AM-PAC OT "6 Clicks" Daily Activity     Outcome Measure   Help from another person eating meals?: None Help from another person taking care of personal grooming?: A Little Help from another person toileting, which includes using toliet, bedpan, or urinal?: A Lot Help from another person bathing (including washing, rinsing, drying)?: A Lot Help  from another person to put on and taking off regular upper body clothing?: A Lot Help from another person to put on and taking off regular lower body clothing?: A Lot 6 Click Score: 15    End of Session Equipment Utilized During Treatment: Oxygen;Gait belt  OT Visit Diagnosis: Unsteadiness on feet (R26.81);Repeated falls (R29.6);Muscle weakness (generalized) (M62.81)   Activity Tolerance Patient tolerated treatment well   Patient Left in bed;with call bell/phone within reach;with bed alarm set   Nurse Communication Mobility status;Other (comment) (Need for chaplain referral)        Time: 1607-3710 OT Time Calculation (min): 25 min  Charges: OT General Charges $OT Visit: 1 Visit OT Treatments $Self Care/Home Management : 23-37 mins  [name], OTS Acute Rehab Office: [phone]   Campbell Kray 08/01/2021, 4:53 PM

## 2021-08-02 LAB — CBC
HCT: 27.6 % — ABNORMAL LOW (ref 39.0–52.0)
Hemoglobin: 8.3 g/dL — ABNORMAL LOW (ref 13.0–17.0)
MCH: 31.2 pg (ref 26.0–34.0)
MCHC: 30.1 g/dL (ref 30.0–36.0)
MCV: 103.8 fL — ABNORMAL HIGH (ref 80.0–100.0)
Platelets: 139 10*3/uL — ABNORMAL LOW (ref 150–400)
RBC: 2.66 MIL/uL — ABNORMAL LOW (ref 4.22–5.81)
RDW: 15.6 % — ABNORMAL HIGH (ref 11.5–15.5)
WBC: 6.5 10*3/uL (ref 4.0–10.5)
nRBC: 0 % (ref 0.0–0.2)

## 2021-08-02 LAB — GLUCOSE, CAPILLARY
Glucose-Capillary: 80 mg/dL (ref 70–99)
Glucose-Capillary: 94 mg/dL (ref 70–99)
Glucose-Capillary: 111 mg/dL — ABNORMAL HIGH (ref 70–99)

## 2021-08-02 LAB — HEPATITIS B SURFACE ANTIBODY,QUALITATIVE: Hep B S Ab: REACTIVE — AB

## 2021-08-02 LAB — RENAL FUNCTION PANEL
Albumin: 2.4 g/dL — ABNORMAL LOW (ref 3.5–5.0)
Anion gap: 12 (ref 5–15)
BUN: 68 mg/dL — ABNORMAL HIGH (ref 8–23)
CO2: 27 mmol/L (ref 22–32)
Calcium: 8.6 mg/dL — ABNORMAL LOW (ref 8.9–10.3)
Chloride: 93 mmol/L — ABNORMAL LOW (ref 98–111)
Creatinine, Ser: 5.8 mg/dL — ABNORMAL HIGH (ref 0.61–1.24)
GFR, Estimated: 10 mL/min — ABNORMAL LOW (ref >60)
Glucose, Bld: 74 mg/dL (ref 70–99)
Phosphorus: 6.2 mg/dL — ABNORMAL HIGH (ref 2.5–4.6)
Potassium: 4.4 mmol/L (ref 3.5–5.1)
Sodium: 132 mmol/L — ABNORMAL LOW (ref 135–145)

## 2021-08-02 LAB — HEPATITIS B CORE ANTIBODY, TOTAL
Hep B Core Total Ab: NONREACTIVE
Hep B Core Total Ab: NONREACTIVE

## 2021-08-02 LAB — HEPATITIS B SURFACE ANTIGEN
Hepatitis B Surface Ag: NONREACTIVE
Hepatitis B Surface Ag: NONREACTIVE

## 2021-08-02 NOTE — Progress Notes (Signed)
Inpatient Rehab Admissions Coordinator:    I have insurance auth for [name] but do not have a bed for this Pt. Today. I will follow for potential admit pending bed availability.    Clemens Catholic, [place], [place] Admissions Coordinator  [phone] ([place]) [phone] (office)

## 2021-08-02 NOTE — Procedures (Signed)
Patient seen on Hemodialysis. BP (!) 98/54   Pulse 80   Temp 97.6 F (36.4 C) (Oral)   Resp 14   Ht 5\' 11"  (1.803 m)   Wt 82.7 kg   SpO2 98%   BMI 25.43 kg/m   QB 400, UF goal 3L Tolerating treatment without complaints at this time- had some nausea earlier that is better.   Elmarie Shiley MD [facility] Office # [phone] Pager # [phone] 10:35 AM

## 2021-08-02 NOTE — Progress Notes (Signed)
Patient is status post below-knee amputation revision.  He is seen in dialysis today.  Overall doing well.   0 cc in the canister vacs functioning well.  Discussed with patient who is agreeable to inpatient rehab.  Will await insurance authorization and bed availability hopefully in the next day or so

## 2021-08-02 NOTE — Progress Notes (Signed)
   08/02/21 1530  Clinical Encounter Type  Visited With Patient and family together  Visit Type Initial;Spiritual support;Social support  Referral From Nurse  Consult/Referral To [facility] responded to consult for prayer. Pt's wife, Janace Hoard, was at bedside. Chaplain engaged active listening and provided emotional and spiritual support. Pt has been in and out of hospital for awhile and he and his wife are trying to hold onto hope. Pt's wife shared they have a strong support system of family and friends. Chaplain prayed with Pt and his wife. Pt's wife requested follow-up visit. Chaplain remains available.  This note was prepared by Chaplain Resident, Dante Gang, MDiv. Chaplain remains available as needed through the on-call pager: [phone].

## 2021-08-03 ENCOUNTER — Inpatient Hospital Stay (HOSPITAL_COMMUNITY)
Admission: RE | Admit: 2021-08-03 | Discharge: 2021-08-24 | DRG: 559 | Disposition: A | Discharge status: Home Health | Payer: Managed Care, Other (non HMO) | Source: Intra-hospital | Attending: Physical Medicine and Rehabilitation | Admitting: Physical Medicine and Rehabilitation

## 2021-08-03 ENCOUNTER — Other Ambulatory Visit: Payer: Self-pay

## 2021-08-03 ENCOUNTER — Encounter (HOSPITAL_COMMUNITY): Payer: Self-pay | Admitting: Physical Medicine and Rehabilitation

## 2021-08-03 DIAGNOSIS — E669 Obesity, unspecified: Secondary | ICD-10-CM | POA: Diagnosis present

## 2021-08-03 DIAGNOSIS — F064 Anxiety disorder due to known physiological condition: Secondary | ICD-10-CM | POA: Diagnosis present

## 2021-08-03 DIAGNOSIS — I255 Ischemic cardiomyopathy: Secondary | ICD-10-CM | POA: Diagnosis present

## 2021-08-03 DIAGNOSIS — I953 Hypotension of hemodialysis: Secondary | ICD-10-CM | POA: Diagnosis not present

## 2021-08-03 DIAGNOSIS — F32A Depression, unspecified: Secondary | ICD-10-CM | POA: Diagnosis present

## 2021-08-03 DIAGNOSIS — Z79891 Long term (current) use of opiate analgesic: Secondary | ICD-10-CM

## 2021-08-03 DIAGNOSIS — E1151 Type 2 diabetes mellitus with diabetic peripheral angiopathy without gangrene: Secondary | ICD-10-CM | POA: Diagnosis present

## 2021-08-03 DIAGNOSIS — E871 Hypo-osmolality and hyponatremia: Secondary | ICD-10-CM | POA: Diagnosis present

## 2021-08-03 DIAGNOSIS — I252 Old myocardial infarction: Secondary | ICD-10-CM | POA: Diagnosis not present

## 2021-08-03 DIAGNOSIS — Z951 Presence of aortocoronary bypass graft: Secondary | ICD-10-CM

## 2021-08-03 DIAGNOSIS — Z7982 Long term (current) use of aspirin: Secondary | ICD-10-CM

## 2021-08-03 DIAGNOSIS — Z9889 Other specified postprocedural states: Secondary | ICD-10-CM | POA: Diagnosis not present

## 2021-08-03 DIAGNOSIS — R269 Unspecified abnormalities of gait and mobility: Secondary | ICD-10-CM | POA: Diagnosis present

## 2021-08-03 DIAGNOSIS — Z888 Allergy status to other drugs, medicaments and biological substances status: Secondary | ICD-10-CM

## 2021-08-03 DIAGNOSIS — E1122 Type 2 diabetes mellitus with diabetic chronic kidney disease: Secondary | ICD-10-CM | POA: Diagnosis present

## 2021-08-03 DIAGNOSIS — K5903 Drug induced constipation: Secondary | ICD-10-CM | POA: Diagnosis not present

## 2021-08-03 DIAGNOSIS — Z905 Acquired absence of kidney: Secondary | ICD-10-CM

## 2021-08-03 DIAGNOSIS — E11649 Type 2 diabetes mellitus with hypoglycemia without coma: Secondary | ICD-10-CM | POA: Diagnosis not present

## 2021-08-03 DIAGNOSIS — G4700 Insomnia, unspecified: Secondary | ICD-10-CM | POA: Diagnosis present

## 2021-08-03 DIAGNOSIS — R278 Other lack of coordination: Secondary | ICD-10-CM | POA: Diagnosis not present

## 2021-08-03 DIAGNOSIS — Z8673 Personal history of transient ischemic attack (TIA), and cerebral infarction without residual deficits: Secondary | ICD-10-CM

## 2021-08-03 DIAGNOSIS — Z515 Encounter for palliative care: Secondary | ICD-10-CM | POA: Diagnosis not present

## 2021-08-03 DIAGNOSIS — D631 Anemia in chronic kidney disease: Secondary | ICD-10-CM | POA: Diagnosis present

## 2021-08-03 DIAGNOSIS — K5900 Constipation, unspecified: Secondary | ICD-10-CM | POA: Diagnosis not present

## 2021-08-03 DIAGNOSIS — I2510 Atherosclerotic heart disease of native coronary artery without angina pectoris: Secondary | ICD-10-CM | POA: Diagnosis present

## 2021-08-03 DIAGNOSIS — I132 Hypertensive heart and chronic kidney disease with heart failure and with stage 5 chronic kidney disease, or end stage renal disease: Secondary | ICD-10-CM | POA: Diagnosis present

## 2021-08-03 DIAGNOSIS — J90 Pleural effusion, not elsewhere classified: Secondary | ICD-10-CM | POA: Diagnosis not present

## 2021-08-03 DIAGNOSIS — R0602 Shortness of breath: Secondary | ICD-10-CM

## 2021-08-03 DIAGNOSIS — I739 Peripheral vascular disease, unspecified: Secondary | ICD-10-CM | POA: Diagnosis not present

## 2021-08-03 DIAGNOSIS — N186 End stage renal disease: Secondary | ICD-10-CM

## 2021-08-03 DIAGNOSIS — Z992 Dependence on renal dialysis: Secondary | ICD-10-CM | POA: Diagnosis not present

## 2021-08-03 DIAGNOSIS — Z8249 Family history of ischemic heart disease and other diseases of the circulatory system: Secondary | ICD-10-CM

## 2021-08-03 DIAGNOSIS — I5023 Acute on chronic systolic (congestive) heart failure: Secondary | ICD-10-CM | POA: Diagnosis present

## 2021-08-03 DIAGNOSIS — Z9049 Acquired absence of other specified parts of digestive tract: Secondary | ICD-10-CM

## 2021-08-03 DIAGNOSIS — T402X5A Adverse effect of other opioids, initial encounter: Secondary | ICD-10-CM | POA: Diagnosis present

## 2021-08-03 DIAGNOSIS — Z4781 Encounter for orthopedic aftercare following surgical amputation: Secondary | ICD-10-CM | POA: Diagnosis present

## 2021-08-03 DIAGNOSIS — Z794 Long term (current) use of insulin: Secondary | ICD-10-CM

## 2021-08-03 DIAGNOSIS — R34 Anuria and oliguria: Secondary | ICD-10-CM | POA: Diagnosis present

## 2021-08-03 DIAGNOSIS — E8809 Other disorders of plasma-protein metabolism, not elsewhere classified: Secondary | ICD-10-CM | POA: Diagnosis present

## 2021-08-03 DIAGNOSIS — F329 Major depressive disorder, single episode, unspecified: Secondary | ICD-10-CM

## 2021-08-03 DIAGNOSIS — K5901 Slow transit constipation: Secondary | ICD-10-CM | POA: Diagnosis not present

## 2021-08-03 DIAGNOSIS — Z79899 Other long term (current) drug therapy: Secondary | ICD-10-CM

## 2021-08-03 DIAGNOSIS — E119 Type 2 diabetes mellitus without complications: Secondary | ICD-10-CM | POA: Diagnosis not present

## 2021-08-03 DIAGNOSIS — Z993 Dependence on wheelchair: Secondary | ICD-10-CM

## 2021-08-03 DIAGNOSIS — M898X9 Other specified disorders of bone, unspecified site: Secondary | ICD-10-CM | POA: Diagnosis present

## 2021-08-03 DIAGNOSIS — F5104 Psychophysiologic insomnia: Secondary | ICD-10-CM

## 2021-08-03 DIAGNOSIS — Z89512 Acquired absence of left leg below knee: Secondary | ICD-10-CM | POA: Diagnosis not present

## 2021-08-03 DIAGNOSIS — M2141 Flat foot [pes planus] (acquired), right foot: Secondary | ICD-10-CM | POA: Diagnosis present

## 2021-08-03 DIAGNOSIS — Z85528 Personal history of other malignant neoplasm of kidney: Secondary | ICD-10-CM

## 2021-08-03 DIAGNOSIS — Z6825 Body mass index (BMI) 25.0-25.9, adult: Secondary | ICD-10-CM

## 2021-08-03 DIAGNOSIS — R251 Tremor, unspecified: Secondary | ICD-10-CM | POA: Diagnosis not present

## 2021-08-03 DIAGNOSIS — Z7189 Other specified counseling: Secondary | ICD-10-CM | POA: Diagnosis not present

## 2021-08-03 DIAGNOSIS — Z7902 Long term (current) use of antithrombotics/antiplatelets: Secondary | ICD-10-CM

## 2021-08-03 LAB — GLUCOSE, CAPILLARY
Glucose-Capillary: 132 mg/dL — ABNORMAL HIGH (ref 70–99)
Glucose-Capillary: 128 mg/dL — ABNORMAL HIGH (ref 70–99)
Glucose-Capillary: 90 mg/dL (ref 70–99)

## 2021-08-03 LAB — HEPATITIS B SURFACE ANTIBODY, QUANTITATIVE: Hep B S AB Quant (Post): 66.3 m[IU]/mL (ref >9.9)

## 2021-08-03 MED ORDER — TEMAZEPAM 15 MG PO CAPS: 15.0000 mg | ORAL_CAPSULE | Freq: Every evening | ORAL | Status: DC | PRN

## 2021-08-03 MED ORDER — POLYETHYLENE GLYCOL 3350 17 G PO PACK
17.0000 g | PACK | Freq: Every day | ORAL | Status: DC | PRN
Start: 2021-08-03 — End: 2021-08-19
  Administered 2021-08-06 – 2021-08-18 (×3): 17 g via ORAL
  Filled 2021-08-03 (×4): qty 1

## 2021-08-03 MED ORDER — PHENOL 1.4 % MT LIQD
1.0000 | OROMUCOSAL | Status: DC | PRN
  Filled 2021-08-03: qty 177

## 2021-08-03 MED ORDER — MIDODRINE HCL 5 MG PO TABS
10.0000 mg | ORAL_TABLET | Freq: Two times a day (BID) | ORAL | Status: DC
  Administered 2021-08-04 – 2021-08-09 (×11): 10 mg via ORAL
  Filled 2021-08-03 (×12): qty 2

## 2021-08-03 MED ORDER — DOCUSATE SODIUM 100 MG PO CAPS
100.0000 mg | ORAL_CAPSULE | Freq: Every day | ORAL | Status: DC
  Administered 2021-08-04 – 2021-08-10 (×7): 100 mg via ORAL
  Filled 2021-08-03 (×7): qty 1

## 2021-08-03 MED ORDER — INSULIN ASPART 100 UNIT/ML IJ SOLN
2.0000 [IU] | Freq: Three times a day (TID) | INTRAMUSCULAR | Status: DC
  Administered 2021-08-03 – 2021-08-04 (×2): 2 [IU] via SUBCUTANEOUS

## 2021-08-03 MED ORDER — CINACALCET HCL 30 MG PO TABS
30.0000 mg | ORAL_TABLET | ORAL | Status: DC
  Administered 2021-08-07 – 2021-08-21 (×3): 30 mg via ORAL
  Filled 2021-08-03 (×3): qty 1

## 2021-08-03 MED ORDER — SIMETHICONE 80 MG PO CHEW
80.0000 mg | CHEWABLE_TABLET | Freq: Four times a day (QID) | ORAL | Status: DC | PRN
  Administered 2021-08-05: 80 mg via ORAL
  Filled 2021-08-03: qty 1

## 2021-08-03 MED ORDER — HYDROMORPHONE HCL 1 MG/ML IJ SOLN: 0.5000 mg | INTRAMUSCULAR | Status: DC | PRN

## 2021-08-03 MED ORDER — FERRIC CITRATE 1 GM 210 MG(FE) PO TABS
210.0000 mg | ORAL_TABLET | Freq: Three times a day (TID) | ORAL | Status: DC
  Administered 2021-08-03 – 2021-08-04 (×2): 210 mg via ORAL
  Filled 2021-08-03 (×3): qty 1

## 2021-08-03 MED ORDER — ZINC SULFATE 220 (50 ZN) MG PO CAPS
220.0000 mg | ORAL_CAPSULE | Freq: Every day | ORAL | Status: DC
  Administered 2021-08-04 – 2021-08-07 (×4): 220 mg via ORAL
  Filled 2021-08-03 (×4): qty 1

## 2021-08-03 MED ORDER — PROCHLORPERAZINE MALEATE 5 MG PO TABS
5.0000 mg | ORAL_TABLET | Freq: Four times a day (QID) | ORAL | Status: DC | PRN
  Administered 2021-08-10 – 2021-08-11 (×2): 5 mg via ORAL
  Administered 2021-08-12 – 2021-08-23 (×3): 10 mg via ORAL
  Filled 2021-08-03: qty 2
  Filled 2021-08-03: qty 1
  Filled 2021-08-03 (×2): qty 2
  Filled 2021-08-03: qty 1

## 2021-08-03 MED ORDER — PENTOXIFYLLINE ER 400 MG PO TBCR
400.0000 mg | EXTENDED_RELEASE_TABLET | Freq: Three times a day (TID) | ORAL | Status: DC
  Filled 2021-08-03: qty 1

## 2021-08-03 MED ORDER — ACETAMINOPHEN 325 MG PO TABS
325.0000 mg | ORAL_TABLET | ORAL | Status: DC | PRN
  Administered 2021-08-04 – 2021-08-23 (×21): 650 mg via ORAL
  Filled 2021-08-03 (×22): qty 2

## 2021-08-03 MED ORDER — PROCHLORPERAZINE 25 MG RE SUPP: 12.5000 mg | Freq: Four times a day (QID) | RECTAL | Status: DC | PRN

## 2021-08-03 MED ORDER — DIPHENHYDRAMINE HCL 12.5 MG/5ML PO ELIX
12.5000 mg | ORAL_SOLUTION | Freq: Four times a day (QID) | ORAL | Status: DC | PRN
  Administered 2021-08-11: 25 mg via ORAL
  Filled 2021-08-03: qty 10

## 2021-08-03 MED ORDER — CALCIUM CARBONATE ANTACID 500 MG PO CHEW: 1.0000 | CHEWABLE_TABLET | Freq: Four times a day (QID) | ORAL | Status: DC | PRN

## 2021-08-03 MED ORDER — ATORVASTATIN CALCIUM 40 MG PO TABS
40.0000 mg | ORAL_TABLET | Freq: Every day | ORAL | Status: DC
  Administered 2021-08-04 – 2021-08-24 (×21): 40 mg via ORAL
  Filled 2021-08-03 (×21): qty 1

## 2021-08-03 MED ORDER — PANTOPRAZOLE SODIUM 40 MG PO TBEC
40.0000 mg | DELAYED_RELEASE_TABLET | Freq: Every day | ORAL | Status: DC
Start: 2021-08-04 — End: 2021-08-24
  Administered 2021-08-04 – 2021-08-24 (×21): 40 mg via ORAL
  Filled 2021-08-03 (×21): qty 1

## 2021-08-03 MED ORDER — ASPIRIN EC 81 MG PO TBEC
81.0000 mg | DELAYED_RELEASE_TABLET | Freq: Every day | ORAL | Status: DC
  Administered 2021-08-04 – 2021-08-24 (×21): 81 mg via ORAL
  Filled 2021-08-03 (×21): qty 1

## 2021-08-03 MED ORDER — GABAPENTIN 100 MG PO CAPS: 100.0000 mg | ORAL_CAPSULE | Freq: Every day | ORAL | Status: DC | PRN

## 2021-08-03 MED ORDER — HEPARIN SODIUM (PORCINE) 5000 UNIT/ML IJ SOLN
5000.0000 [IU] | Freq: Three times a day (TID) | INTRAMUSCULAR | Status: DC
  Administered 2021-08-03 – 2021-08-12 (×23): 5000 [IU] via SUBCUTANEOUS
  Filled 2021-08-03 (×23): qty 1

## 2021-08-03 MED ORDER — PROCHLORPERAZINE EDISYLATE 10 MG/2ML IJ SOLN
5.0000 mg | Freq: Four times a day (QID) | INTRAMUSCULAR | Status: DC | PRN
  Administered 2021-08-03: 5 mg via INTRAMUSCULAR
  Filled 2021-08-03: qty 2

## 2021-08-03 MED ORDER — METHOCARBAMOL 500 MG PO TABS
500.0000 mg | ORAL_TABLET | Freq: Four times a day (QID) | ORAL | Status: DC | PRN
  Administered 2021-08-17 – 2021-08-23 (×6): 500 mg via ORAL
  Filled 2021-08-03 (×7): qty 1

## 2021-08-03 MED ORDER — DARBEPOETIN ALFA 200 MCG/0.4ML IJ SOSY
200.0000 ug | PREFILLED_SYRINGE | INTRAMUSCULAR | Status: DC
  Administered 2021-08-09 – 2021-08-23 (×2): 200 ug via INTRAVENOUS
  Filled 2021-08-03 (×5): qty 0.4

## 2021-08-03 MED ORDER — RENA-VITE PO TABS
1.0000 | ORAL_TABLET | Freq: Every day | ORAL | Status: DC
  Administered 2021-08-03 – 2021-08-23 (×21): 1 via ORAL
  Filled 2021-08-03 (×21): qty 1

## 2021-08-03 MED ORDER — TRAZODONE HCL 50 MG PO TABS: 25.0000 mg | ORAL_TABLET | Freq: Every evening | ORAL | Status: DC | PRN

## 2021-08-03 MED ORDER — JUVEN PO PACK: 1.0000 | PACK | Freq: Two times a day (BID) | ORAL | 0 refills | Status: DC

## 2021-08-03 MED ORDER — CLOPIDOGREL BISULFATE 75 MG PO TABS
75.0000 mg | ORAL_TABLET | Freq: Every day | ORAL | Status: DC
  Administered 2021-08-04 – 2021-08-16 (×12): 75 mg via ORAL
  Filled 2021-08-03 (×14): qty 1

## 2021-08-03 MED ORDER — OXYCODONE HCL 5 MG PO TABS
5.0000 mg | ORAL_TABLET | ORAL | Status: DC | PRN
  Administered 2021-08-06 – 2021-08-08 (×2): 5 mg via ORAL
  Administered 2021-08-09: 10 mg via ORAL
  Administered 2021-08-09: 5 mg via ORAL
  Filled 2021-08-03: qty 2
  Filled 2021-08-03: qty 1
  Filled 2021-08-03: qty 2
  Filled 2021-08-03 (×2): qty 1

## 2021-08-03 MED ORDER — ASCORBIC ACID 500 MG PO TABS
1000.0000 mg | ORAL_TABLET | Freq: Every day | ORAL | Status: DC
  Administered 2021-08-04 – 2021-08-24 (×21): 1000 mg via ORAL
  Filled 2021-08-03 (×22): qty 2

## 2021-08-03 MED ORDER — INSULIN ASPART 100 UNIT/ML IJ SOLN
0.0000 [IU] | Freq: Three times a day (TID) | INTRAMUSCULAR | Status: DC
  Administered 2021-08-03: 2 [IU] via SUBCUTANEOUS

## 2021-08-03 MED ORDER — CLONAZEPAM 0.5 MG PO TABS
0.5000 mg | ORAL_TABLET | Freq: Two times a day (BID) | ORAL | Status: DC | PRN
  Administered 2021-08-05: 0.5 mg via ORAL
  Filled 2021-08-03: qty 1

## 2021-08-03 MED ORDER — JUVEN PO PACK
1.0000 | PACK | Freq: Two times a day (BID) | ORAL | Status: DC
  Administered 2021-08-04 – 2021-08-22 (×25): 1 via ORAL
  Filled 2021-08-03 (×23): qty 1

## 2021-08-03 MED ORDER — CHLORHEXIDINE GLUCONATE CLOTH 2 % EX PADS: 6.0000 | MEDICATED_PAD | Freq: Every day | CUTANEOUS | Status: DC

## 2021-08-03 MED ORDER — TRAZODONE HCL 50 MG PO TABS
50.0000 mg | ORAL_TABLET | Freq: Every day | ORAL | Status: DC
  Administered 2021-08-03 – 2021-08-08 (×6): 50 mg via ORAL
  Filled 2021-08-03 (×7): qty 1

## 2021-08-03 MED ORDER — VENLAFAXINE HCL ER 37.5 MG PO CP24
37.5000 mg | ORAL_CAPSULE | Freq: Every evening | ORAL | Status: DC
  Administered 2021-08-03 – 2021-08-23 (×20): 37.5 mg via ORAL
  Filled 2021-08-03 (×22): qty 1

## 2021-08-03 MED ORDER — INSULIN GLARGINE-YFGN 100 UNIT/ML ~~LOC~~ SOLN
5.0000 [IU] | Freq: Every day | SUBCUTANEOUS | Status: DC
  Administered 2021-08-03 – 2021-08-11 (×6): 5 [IU] via SUBCUTANEOUS
  Filled 2021-08-03 (×10): qty 0.05

## 2021-08-03 MED ORDER — BISACODYL 10 MG RE SUPP
10.0000 mg | Freq: Every day | RECTAL | Status: DC | PRN
  Administered 2021-08-07 – 2021-08-23 (×2): 10 mg via RECTAL
  Filled 2021-08-03 (×3): qty 1

## 2021-08-03 NOTE — Progress Notes (Signed)
Physical Therapy Treatment Patient Details Name: Maxwell Harmon MRN: [id number] DOB: 30-Nov-1955 Today's Date: 08/03/2021   History of Present Illness Pt is a 65 y.o. 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 Pt with initial BKA on 7/8, and then L transtibial revision on 9/2. PMH of anxiety, DM type II, toe amputation R, depression, HTN, renal cancer with radical nephrectomy.    PT Comments    Patient received in bed, pleasant and cooperative with therapy, motivated and ready to get to [name] and begin intensive rehab process. Did better with bed mobility today, however continues to require modAx2 for all functional transfers. Reports he has a Charcot boot at home and I highly recommended that his wife bring this to maintain integrity of this foot during rehab. Unable to hop so still had to pivot over to the recliner, however R knee buckled at end of transfer and he needed MaxAx2 to lower safely into the chair. Left up in recliner with all needs met, chair alarm active- will continue efforts.     Recommendations for follow up therapy are one component of a multi-disciplinary discharge planning process, led by the attending physician.  Recommendations may be updated based on patient status, additional functional criteria and insurance authorization.  Follow Up Recommendations  [name]     Equipment Recommendations  Other (comment) (TBD)    Recommendations for Other Services       Precautions / Restrictions Precautions Precautions: Fall Precaution Comments: has charcot foot R LE- would not force hopping until his wife can bring his charcot boot from home Restrictions Weight Bearing Restrictions: Yes LLE Weight Bearing: Non weight bearing     Mobility  Bed Mobility Overal bed mobility: Needs Assistance Bed Mobility: Supine to Sit     Supine to sit: Min guard;HOB elevated     General bed mobility comments: able to get to EOB with min guard today with HOB elevated and extra time, also able to scoot  to EOB much more efficiently today    Transfers Overall transfer level: Needs assistance Equipment used: Rolling walker (2 wheeled) Transfers: Sit to/from Stand Sit to Stand: Mod assist;+2 physical assistance Stand pivot transfers: Mod assist;+2 physical assistance       General transfer comment: continues to require ModAx2 for all functional transfers- did a bit better and needed less time to boost up using rocking technique with goal of "nose over toes" but still unable to hop and had to pivot to chair rather than hopping. R knee buckled at end of transfer and needed MaxAX2 to lower to chair safely.  Ambulation/Gait             General Gait Details: unable   Stairs             Wheelchair Mobility    Modified Rankin (Stroke Patients Only)       Balance Overall balance assessment: Needs assistance Sitting-balance support: No upper extremity supported;Feet supported Sitting balance-Leahy Scale: Fair Sitting balance - Comments: Fair balance with static sitting.   Standing balance support: Bilateral upper extremity supported Standing balance-Leahy Scale: Poor Standing balance comment: reliant on external and BUE support                            Cognition Arousal/Alertness: Awake/alert Behavior During Therapy: Flat affect Overall Cognitive Status: Within Functional Limits for tasks assessed  General Comments: Able to follow multistep commands, more motivated to try novel tasks today      Exercises      General Comments        Pertinent Vitals/Pain Pain Assessment: Faces Faces Pain Scale: No hurt Pain Location: LLE Pain Intervention(s): Limited activity within patient's tolerance;Monitored during session    Home Living                      Prior Function            PT Goals (current goals can now be found in the care plan section) Acute Rehab PT Goals Patient Stated Goal: to  get stronger PT Goal Formulation: With patient Time For Goal Achievement: 08/12/21 Potential to Achieve Goals: Good Progress towards PT goals: Progressing toward goals    Frequency    Min 3X/week      PT Plan Current plan remains appropriate    Co-evaluation              AM-PAC PT "6 Clicks" Mobility   Outcome Measure  Help needed turning from your back to your side while in a flat bed without using bedrails?: A Little Help needed moving from lying on your back to sitting on the side of a flat bed without using bedrails?: A Little Help needed moving to and from a bed to a chair (including a wheelchair)?: Total Help needed standing up from a chair using your arms (e.g., wheelchair or bedside chair)?: A Lot Help needed to walk in hospital room?: Total Help needed climbing 3-5 steps with a railing? : Total 6 Click Score: 11    End of Session Equipment Utilized During Treatment: Gait belt Activity Tolerance: Patient tolerated treatment well Patient left: in chair;with call bell/phone within reach;with chair alarm set Nurse Communication: Mobility status PT Visit Diagnosis: Muscle weakness (generalized) (M62.81);Difficulty in walking, not elsewhere classified (R26.2);Pain Pain - Right/Left: Left Pain - part of body: Leg     Time: 2876-8115 PT Time Calculation (min) (ACUTE ONLY): 21 min  Charges:  $Therapeutic Activity: 8-22 mins                    Windell Norfolk, DPT, PN2   Supplemental Physical Therapist [place]    Pager [phone] Acute Rehab Office [phone]

## 2021-08-03 NOTE — Progress Notes (Signed)
Patient is postop day 6 status post below-knee amputation revision.  He is sitting up in bed eating breakfast.  He states he is concerned about further infection.  He is apprehensive of having had multiple surgeries in a short period of time.  He is concerned about the status of the incision as it cannot be examined because of the wound VAC also concerned that he seemed to have some brain fog.  He says he felt like he was in a fog all day yesterday.  He is feeling better today.   Vital signs stable appropriate to conversation and exam.  Wound VAC is functioning well 0 cc in the canister.  Spoke with the patient about infection and the wound VAC purpose.  Also spoke with him about brain fog and its relationship to multiple hospitalizations and surgeries.  He is totally appropriate today.  He does have insurance authorization and hopefully will transfer to inpatient rehab.  I think this will be a better place for him to progress in his recovery

## 2021-08-03 NOTE — Progress Notes (Signed)
[facility] Progress Note   Subjective:  Seen in room - leg pain stable at this time. No CP, but some mild dyspnea. Reprots that not really making much urine lately -- s/p in/out cath yesterday after ~30 hr and < 59mL. Will d/c BID Lasix as unlikely doing much anymore. Will be transferring to [name] today.  Objective Vitals:   08/02/21 1559 08/02/21 2006 08/03/21 0438 08/03/21 0816  BP: 120/70 130/73 135/76 131/75  Pulse: 90 90 91 92  Resp: 17 17 18 18   Temp: 98.3 F (36.8 C) 97.7 F (36.5 C) 97.9 F (36.6 C) 98.2 F (36.8 C)  TempSrc: Oral Oral Oral Oral  SpO2: 99% 96% 97% 98%  Weight:      Height:       Physical Exam General: Chronically ill appearing man, NAD. Nasal O2 in place Heart: RRR; 4/6 murmur Lungs: CTA anteriorly, no rales Abdomen: soft, non-tender Extremities: 2+ RLE edema, 1+ RUE dependent edema Dialysis Access: LUE AVF + whistle Skin: Scattered bruises and scabbed areas  Additional Objective Labs: Basic Metabolic Panel: Recent Labs  Lab 07/30/21 0657 07/31/21 1158 08/02/21 0835  NA 133* 136 132*  K 4.4 4.0 4.4  CL 96* 96* 93*  CO2 29 31 27   GLUCOSE 90 86 74  BUN 42* 29* 68*  CREATININE 5.10* 3.69* 5.80*  CALCIUM 8.1* 8.4* 8.6*  PHOS  --  4.8* 6.2*   Liver Function Tests: Recent Labs  Lab 07/31/21 1158 08/02/21 0835  ALBUMIN 2.4* 2.4*   CBC: Recent Labs  Lab 07/29/21 0314 07/30/21 0657 07/31/21 1158 08/02/21 0836  WBC 7.9 6.4 6.0 6.5  HGB 8.2* 8.0* 8.7* 8.3*  HCT 27.6* 27.6* 28.8* 27.6*  MCV 105.7* 107.0* 105.5* 103.8*  PLT 116* 104* 122* 139*   Medications:  magnesium sulfate bolus IVPB      vitamin C  1,000 mg Oral Daily   aspirin EC  81 mg Oral Daily   atorvastatin  40 mg Oral Daily   Chlorhexidine Gluconate Cloth  6 each Topical Q0600   cinacalcet  30 mg Oral Weekly   clopidogrel  75 mg Oral Daily   darbepoetin ([name]) injection - DIALYSIS  200 mcg Intravenous Q Wed-HD   docusate sodium  100 mg Oral Daily    ferric citrate  210 mg Oral TID with meals   insulin aspart  0-15 Units Subcutaneous TID WC   insulin aspart  2 Units Subcutaneous TID WC   insulin glargine-yfgn  5 Units Subcutaneous QHS   midodrine  10 mg Oral BID WC   multivitamin  1 tablet Oral QHS   nutrition supplement (JUVEN)  1 packet Oral BID BM   pantoprazole  40 mg Oral Daily   pentoxifylline  400 mg Oral TID WC   traZODone  50 mg Oral QHS   venlafaxine XR  37.5 mg Oral QPM   zinc sulfate  220 mg Oral Daily    Dialysis Orders: MWF at [facility]  Assessment/Plan: 1. L BKA dehiscense s/p revision L BKA 07/28/21: Per ortho. Transferring to [name] today. 2. ESRD: Continue HD per MWF schedule - next tomorrow. LUE AVF has a whistle today - will need f'gram after discharge. 3. HypoTN/volume: Chronically low BP, mido ^to 10mg  TID this admit. Ongoing edema - UF as tolerated. 4. Anemia of ESRD: Hgb 8.3 - continue [name] 278mcg q Wed. 5. Secondary hyperparathyroidism: CorrCa ok, Phos high - continue Auryxia as binder, but will ^ dose to 2/meals. Continue sensipar. 6. Nutrition:  Alb low, continue protein supplements. 7. T2DM 8. PAD 9. CAD  Veneta Penton, Hershal Coria 08/03/2021, 10:20 AM  [employer]

## 2021-08-03 NOTE — Progress Notes (Signed)
Upon arrival to unit; patient oriented to unit and assigned room. Alert and oriented x4; able to make needs known. Denies any pain or discomfort. Lungs cta lobes. Bowel sounds noted active to all four quadrants. Wound vac running at 116mmhg to surgical incision to LLE.   AV fistula to left upper arm; Britt and thrill present. Patient denies any discomfort or pain . HOB elevated, bed in lowest position, call bell within reach.

## 2021-08-03 NOTE — H&P (Signed)
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 Sharol Given. Post op continues to have issues with hypotension, hypoxia, volume overload being managed by HD as well as pain and depressive symptoms. Wound VAC remains in place without drainage and to stay in place for a couple more days per ortho. Urine output has decreased with I/O to <50 cc in > 24 hours therefore lasix discontinued. Therapy has been ongoing and patient continues to be limited by weakness and balance deficits. [name] recommended due to functional decline.     Review of Systems  Constitutional:  Negative for chills and fever.  HENT:  Negative for hearing loss.   Eyes:  Negative for blurred vision.  Respiratory:  Positive for shortness of breath.   Cardiovascular:  Positive for leg swelling. Negative for chest pain.  Gastrointestinal:  Positive for abdominal pain, heartburn and nausea.  Musculoskeletal:  Positive for myalgias.  Skin:  Negative for rash.  Neurological:  Positive for sensory change and weakness.  Psychiatric/Behavioral:  The patient is nervous/anxious and has insomnia.     Past Medical History:  Diagnosis Date   Anemia of chronic disease    Anxiety    CAD (coronary artery disease)    Depression    Diabetes mellitus without complication (HCC)    ESRD (end stage renal disease) on dialysis ([place])    MWF in [place]   History of bleeding ulcers    History of TIAs    Hypertension    PAD (peripheral artery disease) (HCC)    Renal cancer, right ([place])    s/p right radical nephrectomy 01/08/19    Past Surgical History:  Procedure Laterality Date   ABDOMINAL AORTOGRAM W/LOWER EXTREMITY Bilateral  04/14/2021   Procedure: ABDOMINAL AORTOGRAM W/LOWER EXTREMITY;  Surgeon: Elam Dutch, MD;  Location: [facility];  Service: Vascular;  Laterality: Bilateral;   AMPUTATION Left 05/12/2021   Procedure: LEFT BELOW KNEE AMPUTATION;  Surgeon: Newt Minion, MD;  Location: [facility];  Service: Orthopedics;  Laterality: Left;   CATARACT EXTRACTION W/PHACO Left 02/15/2014   Procedure: CATARACT EXTRACTION PHACO AND INTRAOCULAR LENS PLACEMENT (IOC);  Surgeon: Tonny Branch, MD;  Location: [facility];  Service: Ophthalmology;  Laterality: Left;  CDE 10.84   CATARACT EXTRACTION W/PHACO Right 12/14/2013   Procedure: CATARACT EXTRACTION PHACO AND INTRAOCULAR LENS PLACEMENT (IOC);  Surgeon: Tonny Branch, MD;  Location: [facility];  Service: Ophthalmology;  Laterality: Right;  CDE:  16.30   CHOLECYSTECTOMY  02/2013   EYE SURGERY     IR FLUORO GUIDE CV LINE RIGHT  01/05/2019   IR US GUIDE VASC ACCESS RIGHT  01/05/2019   LAPAROSCOPIC NEPHRECTOMY Right 01/08/2019   Procedure: LAPAROSCOPIC RADICAL NEPHRECTOMY;  Surgeon: Raynelle Bring, MD;  Location: [facility];  Service: Urology;  Laterality: Right;   LEFT HEART CATH AND CORS/GRAFTS ANGIOGRAPHY N/A 04/14/2021   Procedure: LEFT HEART CATH AND CORS/GRAFTS ANGIOGRAPHY;  Surgeon: Jolaine Artist, MD;  Location: [facility];  Service: Cardiovascular;  Laterality: N/A;   PENILE PROSTHESIS IMPLANT     PERIPHERAL VASCULAR INTERVENTION Left 04/19/2021   Procedure: PERIPHERAL VASCULAR INTERVENTION;  Surgeon: Cherre Robins, MD;  Location: [facility];  Service: Cardiovascular;  Laterality: Left;   STUMP REVISION Left 07/07/2021   Procedure: REVISION LEFT BELOW KNEE AMPUTATION;  Surgeon: Newt Minion, MD;  Location: [facility];  Service: Orthopedics;  Laterality: Left;   STUMP REVISION Left 07/28/2021   Procedure: REVISION LEFT BELOW KNEE AMPUTATION;  Surgeon: Newt Minion, MD;  Location: [facility];  Service: Orthopedics;  Laterality: Left;   TOE AMPUTATION Right 2013   [facility]    Family History  Problem Relation Age of Onset   Stroke Mother    Stroke Father    Cancer Father    Cancer Brother     Social History:  Married. Has been requiring assistance at wheelchair level. He reports that he has never smoked. He has never used smokeless tobacco. He reports current alcohol use. He reports that he does not use drugs.   Allergies  Allergen Reactions   Ambien [Zolpidem] Other (See Comments)    It causes pt to have insomnia, NOT sleep- idiosyncratic reaction    Medications Prior to Admission  Medication Sig Dispense Refill   aspirin EC 81 MG tablet Take 81 mg by mouth in the morning. Swallow whole.     atorvastatin (LIPITOR) 40 MG tablet Take 40 mg by mouth in the morning and at bedtime.     AURYXIA 1 GM 210 MG(Fe) tablet Take 210-420 mg by mouth See admin instructions. Take 1 tablet by mouth with each home meal, and if eating out take 2 tablet with that meal     cinacalcet (SENSIPAR) 30 MG tablet Take 1 tablet (30 mg total) by mouth once a week. Take on Mondays (Patient taking differently: Take 30 mg by mouth every Monday.) 30 tablet 0   clonazePAM (KLONOPIN) 0.5 MG tablet Take 0.5 mg by mouth 2 (two) times daily as needed for anxiety.     doxycycline (VIBRA-TABS) 100 MG tablet Take 1 tablet (100 mg total) by mouth 2 (two) times daily. (Patient taking differently: Take 100 mg by mouth 2 (two) times daily. continuous) 60 tablet 0   gabapentin (NEURONTIN) 100 MG capsule Take 100 mg by mouth daily as needed (pain.).     Insulin Glargine (BASAGLAR KWIKPEN) 100 UNIT/ML Inject 5 Units into the skin at bedtime. (Patient taking differently: Inject 15 Units into the skin at bedtime.) 3 mL 1   insulin lispro (HUMALOG) 100 UNIT/ML KwikPen Inject 0-10 Units into the skin 4 (four) times daily - after meals and at bedtime. Sliding Scale Insulin     metaxalone (SKELAXIN) 400 MG tablet Take 1 tablet (400 mg total) by mouth 3 (three) times daily as needed for  muscle spasms (please offer- pt forgets to ask). 30 tablet 0   midodrine (PROAMATINE) 2.5 MG tablet Take 2.5 mg by mouth every Monday, Wednesday, and Friday with hemodialysis.     oxyCODONE-acetaminophen (PERCOCET/ROXICET) 5-325 MG tablet Take 1 tablet by mouth every 4 (four) hours as needed. 30 tablet 0   polyethylene glycol (MIRALAX / GLYCOLAX) 17 g packet Take 17 g by mouth daily as needed for mild constipation. 30 each 0   traZODone (DESYREL) 50 MG tablet Take 50 mg by mouth at bedtime.     venlafaxine XR (EFFEXOR-XR) 37.5 MG 24 hr capsule Take 37.5 mg by mouth every evening.     zinc sulfate 220 (50 Zn) MG capsule Take 1 capsule (220 mg total) by mouth daily. (Patient taking differently: Take 220 mg by mouth 2 (two)  times daily.) 60 capsule 0   acetaminophen (TYLENOL) 325 MG tablet Take 1-2 tablets (325-650 mg total) by mouth every 4 (four) hours as needed for mild pain. (Patient not taking: Reported on 07/26/2021)     atorvastatin (LIPITOR) 80 MG tablet Take 1 tablet (80 mg total) by mouth daily. (Patient taking differently: Take 80 mg by mouth 2 (two) times daily.) 30 tablet 0   bethanechol (URECHOLINE) 25 MG tablet Take 1 tablet (25 mg total) by mouth 3 (three) times daily. (Patient not taking: Reported on 07/26/2021) 90 tablet 0   blood glucose meter kit and supplies KIT Dispense based on patient and insurance preference. Use up to four times daily as directed. (FOR ICD-9 250.00, 250.01). 1 each 0   Blood Glucose Monitoring Suppl (BAYER CONTOUR MONITOR) w/Device KIT 1 each by Does not apply route 4 (four) times daily. Test 4 x daily 1 kit 0   calcitRIOL (ROCALTROL) 0.25 MCG capsule Take 1 capsule (0.25 mcg total) by mouth 2 (two) times daily. (Patient not taking: No sig reported) 60 capsule 0   CALCIUM PO Take 1 tablet by mouth in the morning and at bedtime.     clonazePAM (KLONOPIN) 0.25 MG disintegrating tablet Take 1 tablet (0.25 mg total) by mouth 2 (two) times daily as needed (Anxiety).  (Patient not taking: No sig reported) 30 tablet 0   clopidogrel (PLAVIX) 75 MG tablet Take 1 tablet (75 mg total) by mouth daily. (Patient taking differently: Take 75 mg by mouth at bedtime.) 30 tablet 0   furosemide (LASIX) 80 MG tablet Take 1 tablet (80 mg total) by mouth 2 (two) times daily. 60 tablet 0   gentamicin cream (GARAMYCIN) 0.1 % Apply topically daily. (Patient not taking: No sig reported) 15 g 0   glucose blood (BAYER CONTOUR TEST) test strip Use as instructed 4 x daily 150 each 5   Insulin Pen Needle (B-D ULTRAFINE III SHORT PEN) 31G X 8 MM MISC 1 each by Does not apply route as directed. 100 each 3   Lancets MISC 1 each by Does not apply route 4 (four) times daily. 150 each 5   linaclotide (LINZESS) 290 MCG CAPS capsule Take 1 capsule (290 mcg total) by mouth daily before breakfast. (Patient not taking: No sig reported) 30 capsule 0   midodrine (PROAMATINE) 10 MG tablet Take 0.5 tablets (5 mg total) by mouth every Monday, Wednesday, and Friday with hemodialysis. (Patient not taking: Reported on 07/26/2021) 12 tablet 0   multivitamin (RENA-VIT) TABS tablet Take 1 tablet by mouth at bedtime. 30 tablet 0   pantoprazole (PROTONIX) 40 MG tablet Take 1 tablet (40 mg total) by mouth daily. (Patient not taking: No sig reported) 30 tablet 0   QUEtiapine (SEROQUEL) 50 MG tablet TAKE 1 TABLET BY MOUTH EVERYDAY AT BEDTIME 90 tablet 1   senna-docusate (SENOKOT-S) 8.6-50 MG tablet Take 2 tablets by mouth 2 (two) times daily. (Patient not taking: No sig reported) 120 tablet 0   tamsulosin (FLOMAX) 0.4 MG CAPS capsule Take 2 capsules (0.8 mg total) by mouth at bedtime. (Patient not taking: No sig reported) 60 capsule 0   traMADol (ULTRAM) 50 MG tablet Take 1 tablet (50 mg total) by mouth every 6 (six) hours as needed for moderate pain. (Patient not taking: No sig reported) 20 tablet 0    Drug Regimen Review  Drug regimen was reviewed and remains appropriate with no significant issues  identified  Home: Home Living Family/patient expects to be discharged to::  Private residence Living Arrangements: Spouse/significant other Available Help at Discharge: Family, Available PRN/intermittently Type of Home: House Home Access: [place]: One level Bathroom Shower/Tub: Multimedia programmer: [place] Accessibility: Yes Home Equipment: [place] - single point, Environmental consultant - 2 wheels, Wheelchair - manual Additional Comments: States his w/c is heavy and looking into lighter w/c for mobility.   Functional History: Prior Function Level of Independence: Independent with assistive device(s) Comments: Rw for functional transfers. Wife assisting with dressing and transfers into showers  Functional Status:  Mobility: Bed Mobility Overal bed mobility: Needs Assistance Bed Mobility: Supine to Sit, Sit to Supine Supine to sit: Min assist, HOB elevated Sit to supine: Min assist, HOB elevated General bed mobility comments: Focused pt session on UE exercise education to increase functional strength. Transfers Overall transfer level: Needs assistance Equipment used: Rolling walker (2 wheeled) Transfer via Lift Equipment: Stedy Transfers: Sit to/from Stand Sit to Stand: Max assist, +2 physical assistance Stand pivot transfers: Mod assist, +2 physical assistance General transfer comment: Focused pt session on UE exercise education to increase functional strength Ambulation/Gait General Gait Details: unable    ADL: ADL Overall ADL's : Needs assistance/impaired Eating/Feeding: Modified independent Grooming: Set up, Sitting Upper Body Bathing: Set up, Sitting Lower Body Bathing: Minimal assistance, Sitting/lateral leans, Sit to/from stand Upper Body Dressing : Set up, Sitting Lower Body Dressing: Minimal assistance, Sitting/lateral leans, Sit to/from stand Toilet Transfer: Stand-pivot, +2 for physical assistance, +2 for safety/equipment,  Maximal assistance Toilet Transfer Details (indicate cue type and reason): Stand pivot recliner to bed with Stedy equipment with Max A +2 for power up and shifting hips forward into standing. Toileting- Water quality scientist and Hygiene: Min guard, Sitting/lateral lean Functional mobility during ADLs: Maximal assistance, +2 for physical assistance, +2 for safety/equipment General ADL Comments: Focused pt session on UE exercise education to increase functional strength. Pt expressed motivation to complete UE exercises.  Cognition: Cognition Overall Cognitive Status: Within Functional Limits for tasks assessed Orientation Level: Oriented X4 Cognition Arousal/Alertness: Awake/alert Behavior During Therapy: Flat affect Overall Cognitive Status: Within Functional Limits for tasks assessed General Comments: Able to follow multistep commands   Blood pressure 131/75, pulse 92, temperature 98.2 F (36.8 C), temperature source Oral, resp. rate 18, height '5\' 11"'  (1.803 m), weight 80.2 kg, SpO2 98 %. Physical Exam Constitutional:      General: He is not in acute distress.    Appearance: Normal appearance.     Comments: Frail.   HENT:     Head: Normocephalic and atraumatic.     Right Ear: External ear normal.     Left Ear: External ear normal.     Nose: Nose normal.  Eyes:     Extraocular Movements: Extraocular movements intact.     Pupils: Pupils are equal, round, and reactive to light.  Cardiovascular:     Rate and Rhythm: Normal rate and regular rhythm.     Heart sounds: No murmur heard.   No gallop.  Pulmonary:     Effort: Pulmonary effort is normal. No respiratory distress.     Breath sounds: No wheezing.     Comments: O2 via [place] Abdominal:     General: Abdomen is flat. There is no distension.     Palpations: Abdomen is soft.     Tenderness: There is no abdominal tenderness.     Comments: Umbilical incision with steri strips and diffuse ecchymosis.   Musculoskeletal:         General: Normal  range of motion.     Cervical back: Normal range of motion.     Comments: Left knuckles with multiple scabs. Multiple bruises on BUE. L-BKA with compressive stocking and wound VAC in place.  Pes planus deformity right foot without skin breakdown.   Skin:    Coloration: Skin is pale.  Neurological:     Mental Status: He is alert and oriented to person, place, and time.     Comments: Normal language, no focal cn findings. UE grossly 4+/5. RLE 3+ prox to 4+/5 distally, LLE 2/5 proximally and limited by pain. Decreased sensation to PP and LT right foot.  Psychiatric:     Comments: A little flat but generally cooperative    Results for orders placed or performed during the hospital encounter of 07/28/21 (from the past 48 hour(s))  Glucose, capillary     Status: Abnormal   Collection Time: 08/01/21  6:18 PM  Result Value Ref Range   Glucose-Capillary 142 (H) 70 - 99 mg/dL    Comment: Glucose reference range applies only to samples taken after fasting for at least 8 hours.   Comment 1 Document in Chart   Hepatitis B surface antibody,qualitative     Status: Abnormal   Collection Time: 08/01/21  8:56 PM  Result Value Ref Range   Hep B S Ab Reactive (A) NON REACTIVE    Comment: (NOTE) Consistent with immunity, greater than 9.9 mIU/mL.  Performed at [facility], [place] [address] [place], [place] [address]   .Hepatitis B Surface Antigen     Status: None   Collection Time: 08/01/21  9:09 PM  Result Value Ref Range   Hepatitis B Surface Ag NON REACTIVE NON REACTIVE    Comment: Performed at [facility] [address] [place], [place] [address]  Hepatitis B core antibody, total     Status: None   Collection Time: 08/01/21  9:09 PM  Result Value Ref Range   Hep B Core Total Ab NON REACTIVE NON REACTIVE    Comment: Performed at [facility] [address] [place], [place] [address]  Glucose, capillary     Status: Abnormal   Collection Time: 08/01/21  9:21  PM  Result Value Ref Range   Glucose-Capillary 113 (H) 70 - 99 mg/dL    Comment: Glucose reference range applies only to samples taken after fasting for at least 8 hours.  Renal function panel     Status: Abnormal   Collection Time: 08/02/21  8:35 AM  Result Value Ref Range   Sodium 132 (L) 135 - 145 mmol/L   Potassium 4.4 3.5 - 5.1 mmol/L   Chloride 93 (L) 98 - 111 mmol/L   CO2 27 22 - 32 mmol/L   Glucose, Bld 74 70 - 99 mg/dL    Comment: Glucose reference range applies only to samples taken after fasting for at least 8 hours.   BUN 68 (H) 8 - 23 mg/dL   Creatinine, Ser 5.80 (H) 0.61 - 1.24 mg/dL    Comment: DELTA CHECK NOTED   Calcium 8.6 (L) 8.9 - 10.3 mg/dL   Phosphorus 6.2 (H) 2.5 - 4.6 mg/dL   Albumin 2.4 (L) 3.5 - 5.0 g/dL   GFR, Estimated 10 (L) >60 mL/min    Comment: (NOTE) Calculated using the CKD-EPI Creatinine Equation (2021)    Anion gap 12 5 - 15    Comment: Performed at [facility] [address] [place], [place] [address]  CBC     Status: Abnormal   Collection Time: 08/02/21  8:36 AM  Result Value Ref Range   WBC 6.5 4.0 - 10.5 K/uL   RBC 2.66 (L) 4.22 - 5.81 MIL/uL   Hemoglobin 8.3 (L) 13.0 - 17.0 g/dL   HCT 27.6 (L) 39.0 - 52.0 %   MCV 103.8 (H) 80.0 - 100.0 fL   MCH 31.2 26.0 - 34.0 pg   MCHC 30.1 30.0 - 36.0 g/dL   RDW 15.6 (H) 11.5 - 15.5 %   Platelets 139 (L) 150 - 400 K/uL   nRBC 0.0 0.0 - 0.2 %    Comment: Performed at [facility] [address], [place], [place] [address]  Hepatitis B surface antibody     Status: None   Collection Time: 08/02/21  8:36 AM  Result Value Ref Range   Hepatitis B-Post 66.3 Immunity>9.9 mIU/mL    Comment: (NOTE)  Status of Immunity                     Anti-HBs Level  ------------------                     -------------- Inconsistent with Immunity                   0.0 - 9.9 Consistent with Immunity                          >9.9 Performed At: [facility] [address] [place], [place]  [id number] Rush Farmer MD HM:[id number]   Hepatitis B core antibody, total     Status: None   Collection Time: 08/02/21  8:36 AM  Result Value Ref Range   Hep B Core Total Ab NON REACTIVE NON REACTIVE    Comment: Performed at [facility] [address] [place], [place] [address]  .Hepatitis B Surface Antigen     Status: None   Collection Time: 08/02/21  8:37 AM  Result Value Ref Range   Hepatitis B Surface Ag NON REACTIVE NON REACTIVE    Comment: Performed at [facility] [address], [place], [place] [address]  Glucose, capillary     Status: None   Collection Time: 08/02/21 12:01 PM  Result Value Ref Range   Glucose-Capillary 80 70 - 99 mg/dL    Comment: Glucose reference range applies only to samples taken after fasting for at least 8 hours.  Glucose, capillary     Status: None   Collection Time: 08/02/21  4:03 PM  Result Value Ref Range   Glucose-Capillary 94 70 - 99 mg/dL    Comment: Glucose reference range applies only to samples taken after fasting for at least 8 hours.  Glucose, capillary     Status: Abnormal   Collection Time: 08/02/21  8:04 PM  Result Value Ref Range   Glucose-Capillary 111 (H) 70 - 99 mg/dL    Comment: Glucose reference range applies only to samples taken after fasting for at least 8 hours.   No results found.     Medical Problem List and Plan: 1.  Functional and mobility deficits secondary to PVD and left BKA/BKA revision 07/27/21   -patient may not yet shower  -ELOS/Goals: 12-14 days, min assist goals at w/c level 2.  Antithrombotics: -DVT/anticoagulation:  Pharmaceutical: Heparin added  -antiplatelet therapy: ASA/Plavix 3. Pain Management:  Oxycodone prn.    --Schedule gabapentin daily at HS (  instead of prn) 4. Mood: LCSW to follow for evaluation and support.   -antipsychotic agents: N/A 5. Neuropsych: This patient is capable of making decisions on his own behalf. 6. Skin/Wound Care: Continue wound VAC for couple more  days per ortho   --continue Vitamin C and Zinc with juven bid to promote wound healing.  7. Fluids/Electrolytes/Nutrition: Strict I/O. Routine check of labs with HD. 8. T2DM: Monitor BS ac/hs and use SSI for elevated BS  --Continue insulin Glargine with 2 units Novolog TID 9. ESRD: HD MWF at the end of the day to help with tolerance of therapy.   --on Sensipar and ferric citrate for metabolic bone disease.  10. Anemia of chronic disease: Started on weekly [name] 09/28.   --routine H/H check with HD.  11. Hypotension: Monitor on Midodrine BID.  12. Depression with acute reaction?: On Effexor with trazodone to help with sleep.   --Klonopin prn for anxiety.  13. CAD: On ASA/Plavix and Lipitor. Monitor for symptoms with increase in activity.  14.  PAD: Continue Trental TID.        Bary Leriche, PA-C 08/03/2021

## 2021-08-03 NOTE — Progress Notes (Signed)
IP rehab admissions - I have clearance from attending MD to admit to inpatient rehab today.  Call for questions.  [phone]
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
## 2021-08-03 NOTE — H&P (Signed)
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 Sharol Given. Post op continues to have issues with hypotension, hypoxia, volume overload being managed by HD as well as pain and depressive symptoms. Wound VAC remains in place without drainage and to stay in place for a couple more days per ortho. Urine output has decreased with I/O to <50 cc in > 24 hours therefore lasix discontinued. Therapy has been ongoing and patient continues to be limited by weakness and balance deficits. [name] recommended due to functional decline.       Review of Systems  Constitutional:  Negative for chills and fever.  HENT:  Negative for hearing loss.   Eyes:  Negative for blurred vision.  Respiratory:  Positive for shortness of breath.   Cardiovascular:  Positive for leg swelling. Negative for chest pain.  Gastrointestinal:  Positive for abdominal pain, heartburn and nausea.  Musculoskeletal:  Positive for myalgias.  Skin:  Negative for rash.  Neurological:  Positive for sensory change and weakness.  Psychiatric/Behavioral:  The patient is nervous/anxious and has insomnia.           Past Medical History:  Diagnosis Date   Anemia of chronic disease     Anxiety     CAD (coronary artery disease)     Depression     Diabetes mellitus without complication (HCC)     ESRD (end stage renal disease) on dialysis ([place])      MWF in [place]   History of bleeding ulcers     History of TIAs     Hypertension     PAD (peripheral artery disease) (HCC)     Renal cancer, right ([place])      s/p right radical nephrectomy 01/08/19           Past Surgical History:  Procedure Laterality Date   ABDOMINAL  AORTOGRAM W/LOWER EXTREMITY Bilateral 04/14/2021    Procedure: ABDOMINAL AORTOGRAM W/LOWER EXTREMITY;  Surgeon: Elam Dutch, MD;  Location: [facility];  Service: Vascular;  Laterality: Bilateral;   AMPUTATION Left 05/12/2021    Procedure: LEFT BELOW KNEE AMPUTATION;  Surgeon: Newt Minion, MD;  Location: [facility];  Service: Orthopedics;  Laterality: Left;   CATARACT EXTRACTION W/PHACO Left 02/15/2014    Procedure: CATARACT EXTRACTION PHACO AND INTRAOCULAR LENS PLACEMENT (IOC);  Surgeon: Tonny Branch, MD;  Location: [facility];  Service: Ophthalmology;  Laterality: Left;  CDE 10.84   CATARACT EXTRACTION W/PHACO Right 12/14/2013    Procedure: CATARACT EXTRACTION PHACO AND INTRAOCULAR LENS PLACEMENT (IOC);  Surgeon: Tonny Branch, MD;  Location: [facility];  Service: Ophthalmology;  Laterality: Right;  CDE:  16.30   CHOLECYSTECTOMY   02/2013   EYE SURGERY       IR FLUORO GUIDE CV LINE RIGHT   01/05/2019   IR US GUIDE VASC ACCESS RIGHT   01/05/2019   LAPAROSCOPIC NEPHRECTOMY Right 01/08/2019    Procedure: LAPAROSCOPIC RADICAL NEPHRECTOMY;  Surgeon: Raynelle Bring, MD;  Location: [facility];  Service: Urology;  Laterality: Right;   LEFT HEART CATH AND CORS/GRAFTS ANGIOGRAPHY N/A 04/14/2021    Procedure: LEFT HEART CATH AND CORS/GRAFTS ANGIOGRAPHY;  Surgeon: Glori Bickers  R, MD;  Location: [facility];  Service: Cardiovascular;  Laterality: N/A;   PENILE PROSTHESIS IMPLANT       PERIPHERAL VASCULAR INTERVENTION Left 04/19/2021    Procedure: PERIPHERAL VASCULAR INTERVENTION;  Surgeon: Cherre Robins, MD;  Location: [facility];  Service: Cardiovascular;  Laterality: Left;   STUMP REVISION Left 07/07/2021    Procedure: REVISION LEFT BELOW KNEE AMPUTATION;  Surgeon: Newt Minion, MD;  Location: [facility];  Service: Orthopedics;  Laterality: Left;   STUMP REVISION Left 07/28/2021    Procedure: REVISION LEFT BELOW KNEE AMPUTATION;  Surgeon: Newt Minion, MD;  Location: [facility];  Service: Orthopedics;   Laterality: Left;   TOE AMPUTATION Right 2013    [facility]           Family History  Problem Relation Age of Onset   Stroke Mother     Stroke Father     Cancer Father     Cancer Brother        Social History:  Married. Has been requiring assistance at wheelchair level. He reports that he has never smoked. He has never used smokeless tobacco. He reports current alcohol use. He reports that he does not use drugs.          Allergies  Allergen Reactions   Ambien [Zolpidem] Other (See Comments)      It causes pt to have insomnia, NOT sleep- idiosyncratic reaction            Medications Prior to Admission  Medication Sig Dispense Refill   aspirin EC 81 MG tablet Take 81 mg by mouth in the morning. Swallow whole.       atorvastatin (LIPITOR) 40 MG tablet Take 40 mg by mouth in the morning and at bedtime.       AURYXIA 1 GM 210 MG(Fe) tablet Take 210-420 mg by mouth See admin instructions. Take 1 tablet by mouth with each home meal, and if eating out take 2 tablet with that meal       cinacalcet (SENSIPAR) 30 MG tablet Take 1 tablet (30 mg total) by mouth once a week. Take on Mondays (Patient taking differently: Take 30 mg by mouth every Monday.) 30 tablet 0   clonazePAM (KLONOPIN) 0.5 MG tablet Take 0.5 mg by mouth 2 (two) times daily as needed for anxiety.       doxycycline (VIBRA-TABS) 100 MG tablet Take 1 tablet (100 mg total) by mouth 2 (two) times daily. (Patient taking differently: Take 100 mg by mouth 2 (two) times daily. continuous) 60 tablet 0   gabapentin (NEURONTIN) 100 MG capsule Take 100 mg by mouth daily as needed (pain.).       Insulin Glargine (BASAGLAR KWIKPEN) 100 UNIT/ML Inject 5 Units into the skin at bedtime. (Patient taking differently: Inject 15 Units into the skin at bedtime.) 3 mL 1   insulin lispro (HUMALOG) 100 UNIT/ML KwikPen Inject 0-10 Units into the skin 4 (four) times daily - after meals and at bedtime. Sliding Scale Insulin        metaxalone (SKELAXIN) 400 MG tablet Take 1 tablet (400 mg total) by mouth 3 (three) times daily as needed for muscle spasms (please offer- pt forgets to ask). 30 tablet 0   midodrine (PROAMATINE) 2.5 MG tablet Take 2.5 mg by mouth every Monday, Wednesday, and Friday with hemodialysis.       oxyCODONE-acetaminophen (PERCOCET/ROXICET) 5-325 MG tablet Take 1 tablet by mouth every 4 (four) hours as  needed. 30 tablet 0   polyethylene glycol (MIRALAX / GLYCOLAX) 17 g packet Take 17 g by mouth daily as needed for mild constipation. 30 each 0   traZODone (DESYREL) 50 MG tablet Take 50 mg by mouth at bedtime.       venlafaxine XR (EFFEXOR-XR) 37.5 MG 24 hr capsule Take 37.5 mg by mouth every evening.       zinc sulfate 220 (50 Zn) MG capsule Take 1 capsule (220 mg total) by mouth daily. (Patient taking differently: Take 220 mg by mouth 2 (two) times daily.) 60 capsule 0   acetaminophen (TYLENOL) 325 MG tablet Take 1-2 tablets (325-650 mg total) by mouth every 4 (four) hours as needed for mild pain. (Patient not taking: Reported on 07/26/2021)       atorvastatin (LIPITOR) 80 MG tablet Take 1 tablet (80 mg total) by mouth daily. (Patient taking differently: Take 80 mg by mouth 2 (two) times daily.) 30 tablet 0   bethanechol (URECHOLINE) 25 MG tablet Take 1 tablet (25 mg total) by mouth 3 (three) times daily. (Patient not taking: Reported on 07/26/2021) 90 tablet 0   blood glucose meter kit and supplies KIT Dispense based on patient and insurance preference. Use up to four times daily as directed. (FOR ICD-9 250.00, 250.01). 1 each 0   Blood Glucose Monitoring Suppl (BAYER CONTOUR MONITOR) w/Device KIT 1 each by Does not apply route 4 (four) times daily. Test 4 x daily 1 kit 0   calcitRIOL (ROCALTROL) 0.25 MCG capsule Take 1 capsule (0.25 mcg total) by mouth 2 (two) times daily. (Patient not taking: No sig reported) 60 capsule 0   CALCIUM PO Take 1 tablet by mouth in the morning and at bedtime.       clonazePAM  (KLONOPIN) 0.25 MG disintegrating tablet Take 1 tablet (0.25 mg total) by mouth 2 (two) times daily as needed (Anxiety). (Patient not taking: No sig reported) 30 tablet 0   clopidogrel (PLAVIX) 75 MG tablet Take 1 tablet (75 mg total) by mouth daily. (Patient taking differently: Take 75 mg by mouth at bedtime.) 30 tablet 0   furosemide (LASIX) 80 MG tablet Take 1 tablet (80 mg total) by mouth 2 (two) times daily. 60 tablet 0   gentamicin cream (GARAMYCIN) 0.1 % Apply topically daily. (Patient not taking: No sig reported) 15 g 0   glucose blood (BAYER CONTOUR TEST) test strip Use as instructed 4 x daily 150 each 5   Insulin Pen Needle (B-D ULTRAFINE III SHORT PEN) 31G X 8 MM MISC 1 each by Does not apply route as directed. 100 each 3   Lancets MISC 1 each by Does not apply route 4 (four) times daily. 150 each 5   linaclotide (LINZESS) 290 MCG CAPS capsule Take 1 capsule (290 mcg total) by mouth daily before breakfast. (Patient not taking: No sig reported) 30 capsule 0   midodrine (PROAMATINE) 10 MG tablet Take 0.5 tablets (5 mg total) by mouth every Monday, Wednesday, and Friday with hemodialysis. (Patient not taking: Reported on 07/26/2021) 12 tablet 0   multivitamin (RENA-VIT) TABS tablet Take 1 tablet by mouth at bedtime. 30 tablet 0   pantoprazole (PROTONIX) 40 MG tablet Take 1 tablet (40 mg total) by mouth daily. (Patient not taking: No sig reported) 30 tablet 0   QUEtiapine (SEROQUEL) 50 MG tablet TAKE 1 TABLET BY MOUTH EVERYDAY AT BEDTIME 90 tablet 1   senna-docusate (SENOKOT-S) 8.6-50 MG tablet Take 2 tablets by mouth 2 (two) times daily. (  Patient not taking: No sig reported) 120 tablet 0   tamsulosin (FLOMAX) 0.4 MG CAPS capsule Take 2 capsules (0.8 mg total) by mouth at bedtime. (Patient not taking: No sig reported) 60 capsule 0   traMADol (ULTRAM) 50 MG tablet Take 1 tablet (50 mg total) by mouth every 6 (six) hours as needed for moderate pain. (Patient not taking: No sig reported) 20 tablet 0       Drug Regimen Review  Drug regimen was reviewed and remains appropriate with no significant issues identified   Home: Home Living Family/patient expects to be discharged to:: Private residence Living Arrangements: Spouse/significant other Available Help at Discharge: Family, Available PRN/intermittently Type of Home: House Home Access: Ramped entrance Home Layout: One level Bathroom Shower/Tub: Multimedia programmer: Standard Bathroom Accessibility: Yes Home Equipment: Civil engineer, contracting, Radio producer - single point, Environmental consultant - 2 wheels, Wheelchair - manual Additional Comments: States his w/c is heavy and looking into lighter w/c for mobility.   Functional History: Prior Function Level of Independence: Independent with assistive device(s) Comments: Rw for functional transfers. Wife assisting with dressing and transfers into showers   Functional Status:  Mobility: Bed Mobility Overal bed mobility: Needs Assistance Bed Mobility: Supine to Sit, Sit to Supine Supine to sit: Min assist, HOB elevated Sit to supine: Min assist, HOB elevated General bed mobility comments: Focused pt session on UE exercise education to increase functional strength. Transfers Overall transfer level: Needs assistance Equipment used: Rolling walker (2 wheeled) Transfer via Lift Equipment: Stedy Transfers: Sit to/from Stand Sit to Stand: Max assist, +2 physical assistance Stand pivot transfers: Mod assist, +2 physical assistance General transfer comment: Focused pt session on UE exercise education to increase functional strength Ambulation/Gait General Gait Details: unable   ADL: ADL Overall ADL's : Needs assistance/impaired Eating/Feeding: Modified independent Grooming: Set up, Sitting Upper Body Bathing: Set up, Sitting Lower Body Bathing: Minimal assistance, Sitting/lateral leans, Sit to/from stand Upper Body Dressing : Set up, Sitting Lower Body Dressing: Minimal assistance, Sitting/lateral  leans, Sit to/from stand Toilet Transfer: Stand-pivot, +2 for physical assistance, +2 for safety/equipment, Maximal assistance Toilet Transfer Details (indicate cue type and reason): Stand pivot recliner to bed with Stedy equipment with Max A +2 for power up and shifting hips forward into standing. Toileting- Water quality scientist and Hygiene: Min guard, Sitting/lateral lean Functional mobility during ADLs: Maximal assistance, +2 for physical assistance, +2 for safety/equipment General ADL Comments: Focused pt session on UE exercise education to increase functional strength. Pt expressed motivation to complete UE exercises.   Cognition: Cognition Overall Cognitive Status: Within Functional Limits for tasks assessed Orientation Level: Oriented X4 Cognition Arousal/Alertness: Awake/alert Behavior During Therapy: Flat affect Overall Cognitive Status: Within Functional Limits for tasks assessed General Comments: Able to follow multistep commands     Blood pressure 131/75, pulse 92, temperature 98.2 F (36.8 C), temperature source Oral, resp. rate 18, height '5\' 11"'  (1.803 m), weight 80.2 kg, SpO2 98 %. Physical Exam Constitutional:      General: He is not in acute distress.    Appearance: Normal appearance.     Comments: Frail.   HENT:     Head: Normocephalic and atraumatic.     Right Ear: External ear normal.     Left Ear: External ear normal.     Nose: Nose normal.  Eyes:     Extraocular Movements: Extraocular movements intact.     Pupils: Pupils are equal, round, and reactive to light.  Cardiovascular:     Rate and Rhythm:  Normal rate and regular rhythm.     Heart sounds: No murmur heard.   No gallop.  Pulmonary:     Effort: Pulmonary effort is normal. No respiratory distress.     Breath sounds: No wheezing.     Comments: O2 via [place] Abdominal:     General: Abdomen is flat. There is no distension.     Palpations: Abdomen is soft.     Tenderness: There is no abdominal  tenderness.     Comments: Umbilical incision with steri strips and diffuse ecchymosis.   Musculoskeletal:        General: Normal range of motion.     Cervical back: Normal range of motion.     Comments: Left knuckles with multiple scabs. Multiple bruises on BUE. L-BKA with compressive stocking and wound VAC in place.  Pes planus deformity right foot without skin breakdown.   Skin:    Coloration: Skin is pale.  Neurological:     Mental Status: He is alert and oriented to person, place, and time.     Comments: Normal language, no focal cn findings. UE grossly 4+/5. RLE 3+ prox to 4+/5 distally, LLE 2/5 proximally and limited by pain. Decreased sensation to PP and LT right foot.  Psychiatric:     Comments: A little flat but generally cooperative      Lab Results Last 48 Hours        Results for orders placed or performed during the hospital encounter of 07/28/21 (from the past 48 hour(s))  Glucose, capillary     Status: Abnormal    Collection Time: 08/01/21  6:18 PM  Result Value Ref Range    Glucose-Capillary 142 (H) 70 - 99 mg/dL      Comment: Glucose reference range applies only to samples taken after fasting for at least 8 hours.    Comment 1 Document in Chart    Hepatitis B surface antibody,qualitative     Status: Abnormal    Collection Time: 08/01/21  8:56 PM  Result Value Ref Range    Hep B S Ab Reactive (A) NON REACTIVE      Comment: (NOTE) Consistent with immunity, greater than 9.9 mIU/mL.   Performed at [facility], [place] [address] [place], [place] [address]    .Hepatitis B Surface Antigen     Status: None    Collection Time: 08/01/21  9:09 PM  Result Value Ref Range    Hepatitis B Surface Ag NON REACTIVE NON REACTIVE      Comment: Performed at [facility] [address] [place], [place] [address]  Hepatitis B core antibody, total     Status: None    Collection Time: 08/01/21  9:09 PM  Result Value Ref Range    Hep B Core Total Ab NON REACTIVE NON  REACTIVE      Comment: Performed at [facility] [address], [place], [place] [address]  Glucose, capillary     Status: Abnormal    Collection Time: 08/01/21  9:21 PM  Result Value Ref Range    Glucose-Capillary 113 (H) 70 - 99 mg/dL      Comment: Glucose reference range applies only to samples taken after fasting for at least 8 hours.  Renal function panel     Status: Abnormal    Collection Time: 08/02/21  8:35 AM  Result Value Ref Range    Sodium 132 (L) 135 - 145 mmol/L    Potassium 4.4 3.5 - 5.1  mmol/L    Chloride 93 (L) 98 - 111 mmol/L    CO2 27 22 - 32 mmol/L    Glucose, Bld 74 70 - 99 mg/dL      Comment: Glucose reference range applies only to samples taken after fasting for at least 8 hours.    BUN 68 (H) 8 - 23 mg/dL    Creatinine, Ser 5.80 (H) 0.61 - 1.24 mg/dL      Comment: DELTA CHECK NOTED    Calcium 8.6 (L) 8.9 - 10.3 mg/dL    Phosphorus 6.2 (H) 2.5 - 4.6 mg/dL    Albumin 2.4 (L) 3.5 - 5.0 g/dL    GFR, Estimated 10 (L) >60 mL/min      Comment: (NOTE) Calculated using the CKD-EPI Creatinine Equation (2021)      Anion gap 12 5 - 15      Comment: Performed at [facility] [address], [place], [place] [address]  CBC     Status: Abnormal    Collection Time: 08/02/21  8:36 AM  Result Value Ref Range    WBC 6.5 4.0 - 10.5 K/uL    RBC 2.66 (L) 4.22 - 5.81 MIL/uL    Hemoglobin 8.3 (L) 13.0 - 17.0 g/dL    HCT 27.6 (L) 39.0 - 52.0 %    MCV 103.8 (H) 80.0 - 100.0 fL    MCH 31.2 26.0 - 34.0 pg    MCHC 30.1 30.0 - 36.0 g/dL    RDW 15.6 (H) 11.5 - 15.5 %    Platelets 139 (L) 150 - 400 K/uL    nRBC 0.0 0.0 - 0.2 %      Comment: Performed at [facility] [address], [place], [place] [address]  Hepatitis B surface antibody     Status: None    Collection Time: 08/02/21  8:36 AM  Result Value Ref Range    Hepatitis B-Post 66.3 Immunity>9.9 mIU/mL      Comment: (NOTE)  Status of Immunity                     Anti-HBs Level  ------------------                      -------------- Inconsistent with Immunity                   0.0 - 9.9 Consistent with Immunity                          >9.9 Performed At: [facility] [address] [place], [place] [id number] Rush Farmer MD [id number]    Hepatitis B core antibody, total     Status: None    Collection Time: 08/02/21  8:36 AM  Result Value Ref Range    Hep B Core Total Ab NON REACTIVE NON REACTIVE      Comment: Performed at [facility] [address], [place], [place] [address]  .Hepatitis B Surface Antigen     Status: None    Collection Time: 08/02/21  8:37 AM  Result Value Ref Range    Hepatitis B Surface Ag NON REACTIVE NON REACTIVE      Comment: Performed at [facility] [address], [place], [place] [address]  Glucose, capillary     Status: None    Collection Time: 08/02/21 12:01 PM  Result Value Ref Range    Glucose-Capillary 80  70 - 99 mg/dL      Comment: Glucose reference range applies only to samples taken after fasting for at least 8 hours.  Glucose, capillary     Status: None    Collection Time: 08/02/21  4:03 PM  Result Value Ref Range    Glucose-Capillary 94 70 - 99 mg/dL      Comment: Glucose reference range applies only to samples taken after fasting for at least 8 hours.  Glucose, capillary     Status: Abnormal    Collection Time: 08/02/21  8:04 PM  Result Value Ref Range    Glucose-Capillary 111 (H) 70 - 99 mg/dL      Comment: Glucose reference range applies only to samples taken after fasting for at least 8 hours.      Imaging Results (Last 48 hours)  No results found.           Medical Problem List and Plan: 1.  Functional and mobility deficits secondary to PAD and left BKA/BKA revision 07/27/21              -patient may not yet shower             -ELOS/Goals: 12-14 days, min assist goals at w/c level 2.  Antithrombotics: -DVT/anticoagulation:  Pharmaceutical: Heparin added             -antiplatelet therapy:  ASA/Plavix 3. Pain Management:  Oxycodone prn.               --Schedule gabapentin daily at HS (instead of prn) 4. Mood: LCSW to follow for evaluation and support.              -antipsychotic agents: N/A 5. Neuropsych: This patient is capable of making decisions on his own behalf. 6. Skin/Wound Care: Continue wound VAC for couple more days per ortho              --continue Vitamin C and Zinc with juven bid to promote wound healing.  7. Fluids/Electrolytes/Nutrition: Strict I/O. Routine check of labs with HD. 8. T2DM: Monitor BS ac/hs and use SSI for elevated BS             --Continue insulin Glargine with 2 units Novolog TID 9. ESRD: HD MWF at the end of the day to help with tolerance of therapy.              --on Sensipar and ferric citrate for metabolic bone disease.  10. Anemia of chronic disease: Started on weekly [name] 09/28.              --routine H/H check with HD.  11. Hypotension: Monitor on Midodrine BID.  12. Depression with acute reaction?: On Effexor with trazodone to help with sleep.              --Klonopin prn for anxiety.  13. CAD: On ASA/Plavix and Lipitor. Monitor for symptoms with increase in activity.  14.  PAD: Continue Trental TID.            Bary Leriche, PA-C 08/03/2021   I have personally performed a face to face diagnostic evaluation of this patient and formulated the key components of the plan.  Additionally, I have personally reviewed laboratory data, imaging studies, as well as relevant notes and concur with the physician assistant's documentation above.  The patient's status has not changed from the original H&P.  Any changes in documentation from the acute care chart have been noted above.  Meredith Staggers,  MD, [facility]

## 2021-08-03 NOTE — Discharge Summary (Signed)
Discharge Diagnoses:  Active Problems:   Below-knee amputation of left lower extremity ([place])   Surgeries: Procedure(s): REVISION LEFT BELOW KNEE AMPUTATION on 07/28/2021    Consultants: Treatment Team:  Roney Jaffe, MD Gean Quint, MD Edrick Oh, MD  Discharged Condition: Improved  Hospital Course: Maxwell Harmon is an 65 y.o. male who was admitted 07/28/2021 with a chief complaint of wound dehiscence left leg, with a final diagnosis of Osteomyelitis, Dehiscence Left Below Knee Amputation.  Patient was brought to the operating room on 07/28/2021 and underwent Procedure(s): REVISION LEFT BELOW KNEE AMPUTATION.    Patient was given perioperative antibiotics:  Anti-infectives (From admission, onward)    Start     Dose/Rate Route Frequency Ordered Stop   07/28/21 1430  ceFAZolin (ANCEF) IVPB 2g/100 mL premix  Status:  Discontinued        2 g 200 mL/hr over 30 Minutes Intravenous Every 8 hours 07/28/21 1334 07/28/21 1359   07/28/21 0645  ceFAZolin (ANCEF) IVPB 2g/100 mL premix        2 g 200 mL/hr over 30 Minutes Intravenous On call to O.R. 07/28/21 [id number] 07/28/21 0836     .  Patient was given sequential compression devices, early ambulation, and aspirin for DVT prophylaxis.  Recent vital signs: Patient Vitals for the past 24 hrs:  BP Temp Temp src Pulse Resp SpO2  08/03/21 1400 133/75 98.4 F (36.9 C) Oral 92 -- 100 %  08/03/21 0816 131/75 98.2 F (36.8 C) Oral 92 18 98 %  08/03/21 0438 135/76 97.9 F (36.6 C) Oral 91 18 97 %  08/02/21 2006 130/73 97.7 F (36.5 C) Oral 90 17 96 %  .  Recent laboratory studies: No results found.  Discharge Medications:   Allergies as of 08/03/2021       Reactions   Ambien [zolpidem] Other (See Comments)   It causes pt to have insomnia, NOT sleep- idiosyncratic reaction        Medication List     STOP taking these medications    acetaminophen 325 MG tablet Commonly known as: TYLENOL   bethanechol 25 MG tablet Commonly  known as: URECHOLINE   calcitRIOL 0.25 MCG capsule Commonly known as: ROCALTROL   cinacalcet 30 MG tablet Commonly known as: SENSIPAR   doxycycline 100 MG tablet Commonly known as: VIBRA-TABS   gentamicin cream 0.1 % Commonly known as: GARAMYCIN   linaclotide 290 MCG Caps capsule Commonly known as: LINZESS   oxyCODONE-acetaminophen 5-325 MG tablet Commonly known as: PERCOCET/ROXICET   pantoprazole 40 MG tablet Commonly known as: PROTONIX   pentoxifylline 400 MG CR tablet Commonly known as: TRENTAL   senna-docusate 8.6-50 MG tablet Commonly known as: Senokot-S   tamsulosin 0.4 MG Caps capsule Commonly known as: FLOMAX   traMADol 50 MG tablet Commonly known as: ULTRAM       TAKE these medications    aspirin EC 81 MG tablet Take 81 mg by mouth in the morning. Swallow whole.   atorvastatin 80 MG tablet Commonly known as: LIPITOR Take 1 tablet (80 mg total) by mouth daily. What changed:  when to take this Another medication with the same name was removed. Continue taking this medication, and follow the directions you see here.   Auryxia 1 GM 210 MG(Fe) tablet Generic drug: ferric citrate Take 210-420 mg by mouth See admin instructions. Take 1 tablet by mouth with each home meal, and if eating out take 2 tablet with that meal   Basaglar KwikPen 100 UNIT/ML Inject 5  Units into the skin at bedtime. What changed: how much to take   [employer] w/Device Kit 1 each by Does not apply route 4 (four) times daily. Test 4 x daily   blood glucose meter kit and supplies Kit Dispense based on patient and insurance preference. Use up to four times daily as directed. (FOR ICD-9 250.00, 250.01).   CALCIUM PO Take 1 tablet by mouth in the morning and at bedtime.   clonazePAM 0.5 MG tablet Commonly known as: KLONOPIN Take 0.5 mg by mouth 2 (two) times daily as needed for anxiety. What changed: Another medication with the same name was removed. Continue taking  this medication, and follow the directions you see here.   clopidogrel 75 MG tablet Commonly known as: PLAVIX Take 1 tablet (75 mg total) by mouth daily. What changed: when to take this   furosemide 80 MG tablet Commonly known as: LASIX Take 1 tablet (80 mg total) by mouth 2 (two) times daily.   gabapentin 100 MG capsule Commonly known as: NEURONTIN Take 100 mg by mouth daily as needed (pain.).   glucose blood test strip Commonly known as: Estate manager/land agent Use as instructed 4 x daily   insulin lispro 100 UNIT/ML KwikPen Commonly known as: HUMALOG Inject 0-10 Units into the skin 4 (four) times daily - after meals and at bedtime. Sliding Scale Insulin   Insulin Pen Needle 31G X 8 MM Misc Commonly known as: B-D ULTRAFINE III SHORT PEN 1 each by Does not apply route as directed.   Lancets Misc 1 each by Does not apply route 4 (four) times daily.   metaxalone 400 MG tablet Commonly known as: SKELAXIN Take 1 tablet (400 mg total) by mouth 3 (three) times daily as needed for muscle spasms (please offer- pt forgets to ask).   midodrine 2.5 MG tablet Commonly known as: PROAMATINE Take 2.5 mg by mouth every Monday, Wednesday, and Friday with hemodialysis. What changed: Another medication with the same name was removed. Continue taking this medication, and follow the directions you see here.   multivitamin Tabs tablet Take 1 tablet by mouth at bedtime.   nutrition supplement (JUVEN) Pack Take 1 packet by mouth 2 (two) times daily between meals.   polyethylene glycol 17 g packet Commonly known as: MIRALAX / GLYCOLAX Take 17 g by mouth daily as needed for mild constipation.   QUEtiapine 50 MG tablet Commonly known as: SEROQUEL TAKE 1 TABLET BY MOUTH EVERYDAY AT BEDTIME   traZODone 50 MG tablet Commonly known as: DESYREL Take 50 mg by mouth at bedtime.   venlafaxine XR 37.5 MG 24 hr capsule Commonly known as: EFFEXOR-XR Take 37.5 mg by mouth every evening.   zinc  sulfate 220 (50 Zn) MG capsule Take 1 capsule (220 mg total) by mouth daily. What changed: when to take this        Diagnostic Studies: No results found.  Patient benefited maximally from their hospital stay and there were no complications.     Disposition: Discharge disposition: 02-Transferred to [facility]      Discharge Instructions     Call MD / Call 911   Complete by: As directed    If you experience chest pain or shortness of breath, CALL 911 and be transported to the hospital emergency room.  If you develope a fever above 101 F, pus (white drainage) or increased drainage or redness at the wound, or calf pain, call your surgeon's office.   Constipation Prevention  Complete by: As directed    Drink plenty of fluids.  Prune juice may be helpful.  You may use a stool softener, such as Colace (over the counter) 100 mg twice a day.  Use MiraLax (over the counter) for constipation as needed.   Diet - low sodium heart healthy   Complete by: As directed    Increase activity slowly as tolerated   Complete by: As directed    Negative Pressure Wound Therapy - Incisional   Complete by: As directed    Show patient how to attach prevena vac   Post-operative opioid taper instructions:   Complete by: As directed    POST-OPERATIVE OPIOID TAPER INSTRUCTIONS: It is important to wean off of your opioid medication as soon as possible. If you do not need pain medication after your surgery it is ok to stop day one. Opioids include: Codeine, Hydrocodone(Norco, Vicodin), Oxycodone(Percocet, oxycontin) and hydromorphone amongst others.  Long term and even short term use of opiods can cause: Increased pain response Dependence Constipation Depression Respiratory depression And more.  Withdrawal symptoms can include Flu like symptoms Nausea, vomiting And more Techniques to manage these symptoms Hydrate well Eat regular healthy meals Stay active Use relaxation techniques(deep  breathing, meditating, yoga) Do Not substitute Alcohol to help with tapering If you have been on opioids for less than two weeks and do not have pain than it is ok to stop all together.  Plan to wean off of opioids This plan should start within one week post op of your joint replacement. Maintain the same interval or time between taking each dose and first decrease the dose.  Cut the total daily intake of opioids by one tablet each day Next start to increase the time between doses. The last dose that should be eliminated is the evening dose.            Signed: Bevely Palmer Whalen Trompeter 08/03/2021, 4:01 PM

## 2021-08-04 LAB — CBC
HCT: 29.5 % — ABNORMAL LOW (ref 39.0–52.0)
Hemoglobin: 8.9 g/dL — ABNORMAL LOW (ref 13.0–17.0)
MCH: 30.9 pg (ref 26.0–34.0)
MCHC: 30.2 g/dL (ref 30.0–36.0)
MCV: 102.4 fL — ABNORMAL HIGH (ref 80.0–100.0)
Platelets: 152 10*3/uL (ref 150–400)
RBC: 2.88 MIL/uL — ABNORMAL LOW (ref 4.22–5.81)
RDW: 15 % (ref 11.5–15.5)
WBC: 5.3 10*3/uL (ref 4.0–10.5)
nRBC: 0 % (ref 0.0–0.2)

## 2021-08-04 LAB — GLUCOSE, CAPILLARY
Glucose-Capillary: 79 mg/dL (ref 70–99)
Glucose-Capillary: 49 mg/dL — ABNORMAL LOW (ref 70–99)
Glucose-Capillary: 48 mg/dL — ABNORMAL LOW (ref 70–99)
Glucose-Capillary: 55 mg/dL — ABNORMAL LOW (ref 70–99)
Glucose-Capillary: 70 mg/dL (ref 70–99)
Glucose-Capillary: 93 mg/dL (ref 70–99)
Glucose-Capillary: 125 mg/dL — ABNORMAL HIGH (ref 70–99)

## 2021-08-04 LAB — RENAL FUNCTION PANEL
Albumin: 2.5 g/dL — ABNORMAL LOW (ref 3.5–5.0)
Anion gap: 11 (ref 5–15)
BUN: 62 mg/dL — ABNORMAL HIGH (ref 8–23)
CO2: 27 mmol/L (ref 22–32)
Calcium: 8.6 mg/dL — ABNORMAL LOW (ref 8.9–10.3)
Chloride: 93 mmol/L — ABNORMAL LOW (ref 98–111)
Creatinine, Ser: 5.69 mg/dL — ABNORMAL HIGH (ref 0.61–1.24)
GFR, Estimated: 10 mL/min — ABNORMAL LOW (ref >60)
Glucose, Bld: 53 mg/dL — ABNORMAL LOW (ref 70–99)
Phosphorus: 4.5 mg/dL (ref 2.5–4.6)
Potassium: 3.9 mmol/L (ref 3.5–5.1)
Sodium: 131 mmol/L — ABNORMAL LOW (ref 135–145)

## 2021-08-04 MED ORDER — GLUCOSE 40 % PO GEL: 2.0000 | ORAL | Status: AC

## 2021-08-04 MED ORDER — GLUCOSE 40 % PO GEL
ORAL | Status: AC
  Filled 2021-08-04: qty 1.21

## 2021-08-04 MED ORDER — INSULIN ASPART 100 UNIT/ML IJ SOLN
0.0000 [IU] | Freq: Three times a day (TID) | INTRAMUSCULAR | Status: DC
  Administered 2021-08-15 – 2021-08-24 (×8): 1 [IU] via SUBCUTANEOUS

## 2021-08-04 MED ORDER — HYDROMORPHONE HCL 1 MG/ML IJ SOLN
0.5000 mg | INTRAMUSCULAR | Status: DC | PRN
Start: 2021-08-04 — End: 2021-08-08

## 2021-08-04 MED ORDER — FERRIC CITRATE 1 GM 210 MG(FE) PO TABS
420.0000 mg | ORAL_TABLET | Freq: Three times a day (TID) | ORAL | Status: DC
  Administered 2021-08-04 – 2021-08-24 (×57): 420 mg via ORAL
  Filled 2021-08-04 (×63): qty 2

## 2021-08-04 NOTE — Progress Notes (Signed)
Inpatient Rehabilitation  Patient information reviewed and entered into eRehab system by Gracilyn Gunia Adin Laker, OTR/L.   Information including medical coding, functional ability and quality indicators will be reviewed and updated through discharge.    

## 2021-08-04 NOTE — Evaluation (Addendum)
Occupational Therapy Assessment and Plan  Patient Details  Name: Maxwell Harmon MRN: [id number] Date of Birth: Dec 21, 1955  OT Diagnosis: abnormal posture, muscle weakness (generalized), and swelling of limb Rehab Potential: Rehab Potential (ACUTE ONLY): Good ELOS: 2-2.5 weeks   Today's Date: 08/04/2021 OT Individual Time: 0700-0800 OT Individual Time Calculation (min): 60 min     Hospital Problem: Principal Problem:   S/P BKA (below knee amputation) unilateral, left ([place]) Active Problems:   PAD (peripheral artery disease) ([place])   Past Medical History:  Past Medical History:  Diagnosis Date   Anemia of chronic disease    Anxiety    CAD (coronary artery disease)    Depression    Diabetes mellitus without complication ([place])    ESRD (end stage renal disease) on dialysis ([place])    MWF in [place]   History of bleeding ulcers    History of TIAs    Hypertension    PAD (peripheral artery disease) ([facility])    Renal cancer, right ([place])    s/p right radical nephrectomy 01/08/19   Past Surgical History:  Past Surgical History:  Procedure Laterality Date   ABDOMINAL AORTOGRAM W/LOWER EXTREMITY Bilateral 04/14/2021   Procedure: ABDOMINAL AORTOGRAM W/LOWER EXTREMITY;  Surgeon: Elam Dutch, MD;  Location: [facility];  Service: Vascular;  Laterality: Bilateral;   AMPUTATION Left 05/12/2021   Procedure: LEFT BELOW KNEE AMPUTATION;  Surgeon: Newt Minion, MD;  Location: [facility];  Service: Orthopedics;  Laterality: Left;   CATARACT EXTRACTION W/PHACO Left 02/15/2014   Procedure: CATARACT EXTRACTION PHACO AND INTRAOCULAR LENS PLACEMENT (IOC);  Surgeon: Tonny Branch, MD;  Location: [facility];  Service: Ophthalmology;  Laterality: Left;  CDE 10.84   CATARACT EXTRACTION W/PHACO Right 12/14/2013   Procedure: CATARACT EXTRACTION PHACO AND INTRAOCULAR LENS PLACEMENT (IOC);  Surgeon: Tonny Branch, MD;  Location: [facility];  Service: Ophthalmology;  Laterality: Right;  CDE:  16.30   CHOLECYSTECTOMY   02/2013   EYE SURGERY     IR FLUORO GUIDE CV LINE RIGHT  01/05/2019   IR US GUIDE VASC ACCESS RIGHT  01/05/2019   LAPAROSCOPIC NEPHRECTOMY Right 01/08/2019   Procedure: LAPAROSCOPIC RADICAL NEPHRECTOMY;  Surgeon: Raynelle Bring, MD;  Location: [facility];  Service: Urology;  Laterality: Right;   LEFT HEART CATH AND CORS/GRAFTS ANGIOGRAPHY N/A 04/14/2021   Procedure: LEFT HEART CATH AND CORS/GRAFTS ANGIOGRAPHY;  Surgeon: Jolaine Artist, MD;  Location: [facility];  Service: Cardiovascular;  Laterality: N/A;   PENILE PROSTHESIS IMPLANT     PERIPHERAL VASCULAR INTERVENTION Left 04/19/2021   Procedure: PERIPHERAL VASCULAR INTERVENTION;  Surgeon: Cherre Robins, MD;  Location: [facility];  Service: Cardiovascular;  Laterality: Left;   STUMP REVISION Left 07/07/2021   Procedure: REVISION LEFT BELOW KNEE AMPUTATION;  Surgeon: Newt Minion, MD;  Location: [name];  Service: Orthopedics;  Laterality: Left;   STUMP REVISION Left 07/28/2021   Procedure: REVISION LEFT BELOW KNEE AMPUTATION;  Surgeon: Newt Minion, MD;  Location: [facility];  Service: Orthopedics;  Laterality: Left;   TOE AMPUTATION Right 2013   [facility]    Assessment & Plan Clinical Impression: Pt is a 65 y.o. 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 Pt with initial BKA on 7/8, and then L transtibial revision on 9/2. PMH of anxiety, DM type II, toe amputation R, depression, HTN, renal cancer with radical nephrectomy. Post op continues to have issues with hypotension, hypoxia, volume overload being managed by HD as well as  pain and depressive symptoms. Wound VAC remains in place without drainage and to stay in place for a couple more days per ortho. Urine output has decreased with I/O to <50 cc in > 24 hours therefore lasix discontinued. Patient transferred to [name] on 08/03/2021 .     Patient currently requires MIN-MOD with basic self-care skills secondary to muscle weakness, decreased cardiorespiratoy endurance and decreased  oxygen support, decreased coordination, and decreased sitting balance, decreased standing balance, decreased postural control, and decreased balance strategies.  Prior to hospitalization, patient could complete BADLs with min A at wc level.   Patient will benefit from skilled intervention to decrease level of assist with basic self-care skills, increase independence with basic self-care skills, and increase level of independence with iADL prior to discharge home with care partner.  Anticipate patient will require intermittent supervision and follow up home health.  OT - End of Session Activity Tolerance: Tolerates < 10 min activity with changes in vital signs Endurance Deficit: Yes Endurance Deficit Description: required rest breaks throughout OT Assessment Rehab Potential (ACUTE ONLY): Good OT Barriers to Discharge: New oxygen;Weight bearing restrictions;Wound Care OT Patient demonstrates impairments in the following area(s): Balance;Safety;Skin Integrity;Edema;Endurance OT Basic ADL's Functional Problem(s): Grooming;Bathing;Dressing;Toileting OT Transfers Functional Problem(s): Toilet;Tub/Shower OT Plan OT Intensity: Minimum of 1-2 x/day, 45 to 90 minutes OT Frequency: 5 out of 7 days OT Duration/Estimated Length of Stay: 2-2.5 weeks OT Treatment/Interventions: Balance/vestibular training;Cognitive remediation/compensation;Discharge planning;Disease mangement/prevention;DME/adaptive equipment instruction;Functional mobility training;Pain management;Patient/family education;Psychosocial support;Self Care/advanced ADL retraining;Skin care/wound managment;Therapeutic Activities;Therapeutic Exercise;UE/LE Strength taining/ROM;UE/LE Coordination activities;Wheelchair propulsion/positioning OT Self Feeding Anticipated Outcome(s): No goal OT Basic Self-Care Anticipated Outcome(s): Supervision OT Toileting Anticipated Outcome(s): Supervision OT Bathroom Transfers Anticipated Outcome(s):  Supervision OT Recommendation Patient destination: Home Follow Up Recommendations: Home health OT Equipment Recommended: Other (comment) Equipment Details: Pt has BSC, cane, TTB, RW + wc   OT Evaluation Precautions/Restrictions  Precautions Precautions: Fall Precaution Comments:  (Charcot boot for R LE (wife to bring from home)) Restrictions Weight Bearing Restrictions: Yes LLE Weight Bearing: Non weight bearing General   Vital Signs Therapy Vitals Temp: (!) 97.5 F (36.4 C) Temp Source: Oral Pulse Rate: 89 Resp: 18 BP: 118/69 Patient Position (if appropriate): Lying Oxygen Therapy SpO2: 98 % O2 Device: Nasal Cannula O2 Flow Rate (L/min): 2 L/min Pain   Home Living/Prior Functioning Home Living Family/patient expects to be discharged to:: Private residence Living Arrangements: Spouse/significant other Available Help at Discharge: Family, Available PRN/intermittently Type of Home: House Home Access: Ramped entrance Home Layout: One level Bathroom Shower/Tub: Tub/shower unit, Multimedia programmer: Associate Professor Accessibility: Yes  Lives With: Spouse IADL History Leisure and Hobbies:  (pt did not mention any hobbies. He says he doesn't do much besides go to dr's appts) Prior Function Level of Independence: Needs assistance with ADLs, Needs assistance with tranfers  Able to Take Stairs?: No Comments: Rw for functional transfers. Wife assisting with dressing and transfers into showers Vision Baseline Vision/History: 1 Wears glasses Ability to See in Adequate Light: 0 Adequate Patient Visual Report: No change from baseline Vision Assessment?: No apparent visual deficits Perception  Perception: Within Functional Limits Praxis Praxis: Intact Cognition Overall Cognitive Status: Within Functional Limits for tasks assessed Arousal/Alertness: Awake/alert Orientation Level: Person;Place;Situation Person: Oriented Place: Oriented Situation:  Oriented Year: 2022 Month: September Day of Week: Correct Memory: Impaired Memory Impairment: Decreased recall of new information;Decreased short term memory Decreased Short Term Memory: Verbal basic;Functional basic Immediate Memory Recall: Sock;Blue;Bed Memory Recall Sock: With Cue Memory Recall Blue: Without  Cue Memory Recall Bed: With Cue Attention: Focused Focused Attention: Appears intact Awareness: Appears intact Problem Solving: Impaired Problem Solving Impairment: Functional complex Safety/Judgment: Impaired Sensation Sensation Light Touch: Appears Intact Hot/Cold: Appears Intact Proprioception: Appears Intact Stereognosis: Appears Intact Coordination Gross Motor Movements are Fluid and Coordinated: No Fine Motor Movements are Fluid and Coordinated: Yes Coordination and Movement Description: movements slow, but WFL Finger Nose Finger Test: slow, hit target Motor  Motor Motor: Abnormal postural alignment and control Motor - Skilled Clinical Observations: forward head  Trunk/Postural Assessment  Cervical Assessment Cervical Assessment: Exceptions to [facility] Thoracic Assessment Thoracic Assessment:  (forward head) Lumbar Assessment Lumbar Assessment: Within Functional Limits Postural Control Postural Control: Deficits on evaluation  Balance Balance Balance Assessed: Yes Static Sitting Balance Static Sitting - Balance Support: Feet supported;No upper extremity supported Static Sitting - Level of Assistance: 5: Stand by assistance Static Sitting - Comment/# of Minutes: 5 Dynamic Sitting Balance Dynamic Sitting - Balance Support: Feet supported;No upper extremity supported Dynamic Sitting - Level of Assistance: Other (comment) (CGA for dynamic) Dynamic Sitting - Balance Activities: Reaching for objects;Reaching across midline Sitting balance - Comments: Static sitting balance good, dynamic sitting balance with ADLs CGA Extremity/Trunk Assessment RUE  Assessment RUE Assessment: Within Functional Limits General Strength Comments: 4/5 shoulder flex LUE Assessment LUE Assessment: Within Functional Limits General Strength Comments: 4/5 shoulder flex  Care Tool Care Tool Self Care Eating   Eating Assist Level: Set up assist    Oral Care    Oral Care Assist Level: Set up assist    Bathing   Body parts bathed by patient: Right arm;Left arm;Chest;Abdomen;Front perineal area;Buttocks;Right upper leg;Left upper leg;Face;Right lower leg     Assist Level: Supervision/Verbal cueing    Upper Body Dressing(including orthotics)   What is the patient wearing?: Pull over shirt   Assist Level: Minimal Assistance - Patient > 75%    Lower Body Dressing (excluding footwear)   What is the patient wearing?: Ace wrap/stump shrinker Assist for lower body dressing: Maximal Assistance - Patient 25 - 49%    Putting on/Taking off footwear   What is the patient wearing?: Non-skid slipper socks;Shoes Assist for footwear: Maximal Assistance - Patient 25 - 49%       Care Tool Toileting Toileting activity         Care Tool Bed Mobility Roll left and right activity   Roll left and right assist level: Contact Guard/Touching assist    Sit to lying activity   Sit to lying assist level: Contact Guard/Touching assist    Lying to sitting on side of bed activity   Lying to sitting on side of bed assist level: the ability to move from lying on the back to sitting on the side of the bed with no back support.: Contact Guard/Touching assist     Care Tool Transfers Sit to stand transfer        Chair/bed transfer   Chair/bed transfer assist level: Moderate Assistance - Patient 50 - 74%     Toilet transfer         Care Tool Cognition  Expression of Ideas and Wants Expression of Ideas and Wants: 3. Some difficulty - exhibits some difficulty with expressing needs and ideas (e.g, some words or finishing thoughts) or speech is not clear  Understanding  Verbal and Non-Verbal Content Understanding Verbal and Non-Verbal Content: 3. Usually understands - understands most conversations, but misses some part/intent of message. Requires cues at times to understand   Memory/Recall Ability Memory/Recall  Ability : Current season;Location of own room;That he or she is in a hospital/hospital unit   Refer to Care Plan for [place] 1 OT Short Term Goal 1 (Week 1): Pt will perform LB dressing with MIN A OT Short Term Goal 2 (Week 1): Pt will complete MIN A stand > pivot transfer with LRAD to [facility] OT Short Term Goal 3 (Week 1): Pt will tolerate activity for 5 minutes w/o requiring external O2 support  Recommendations for other services: None    Skilled Therapeutic Intervention  Pt received in bed eating breakfast with no c/o pain, and agreeable to OT eval (see details above/below). Pt educated on OT role/purpose, [name], and ELOS. Pt on 2L O2 at rest with O2 sat 99%. O2 was removed for dressing, and after 5 minutes, dropped to 80%. Pt able to normalize back to 98% within 30 seconds after O2 replaced. BP with HOB elevated start of tx 123/70 and O2 at 95%. Pt reports wife helping with ADLs at wc level. Pt's goals are to be more independent at home and to be off oxygen at d/c. Pt with skin breakdown on B UE causing mild bleeding - gauze applied.    ADL: Pt completes BADL at overall MIN-MOD level. Skilled interventions include: Pt completes bed mobility at CGA level supine > EOB. Posterior lean in sitting and needed MIN A to scoot anteriorly. Dressing/bathing completed EOB. Min A for UB, MAX A for LB dressing for pulling pants over hips/mgmt of wound vac. Pt received education on lateral leans to wash buttocks, but could not recall technique to don pants. Pt transferred bed > wc via lateral scoots and MOD A. Pt had anxiety with transfers d/t weakness and reported lack of confidence. Pt able to brush teeth with setup at wc level. Left at  end of session in wc with exit belt alarm on, call light in reach and all needs met.   ADL Eating: Set up Where Assessed-Eating: Bed level Grooming: Setup Where Assessed-Grooming: Wheelchair;Sitting at sink Upper Body Bathing: Setup Where Assessed-Upper Body Bathing: Edge of bed Lower Body Bathing: Moderate assistance Where Assessed-Lower Body Bathing: Edge of bed Upper Body Dressing: Minimal assistance Where Assessed-Upper Body Dressing: Edge of bed Lower Body Dressing: Maximal assistance Where Assessed-Lower Body Dressing: Edge of bed Toileting: Not assessed Toilet Transfer: Minimal assistance Toilet Transfer Method: Sit pivot Tub/Shower Transfer: Minimal assistance Tub/Shower Transfer Method: Sit pivot Tub/Shower Equipment: Facilities manager: Not assessed ADL Comments:  (anxiety with transfers d/t weakess) Mobility  Bed Mobility Bed Mobility: Rolling Left;Sitting - Scoot to Edge of Bed;Supine to Sit Rolling Left: Contact Guard/Touching assist Supine to Sit: Contact Guard/Touching assist Sitting - Scoot to Edge of Bed: Contact Guard/Touching assist   Discharge Criteria: Patient will be discharged from OT if patient refuses treatment 3 consecutive times without medical reason, if treatment goals not met, if there is a change in medical status, if patient makes no progress towards goals or if patient is discharged from hospital.  The above assessment, treatment plan, treatment alternatives and goals were discussed and mutually agreed upon: by patient  [facility] 08/04/2021, 8:52 AM

## 2021-08-04 NOTE — Progress Notes (Signed)
Patient ID: [name], male   DOB: January 11, 1956, 65 y.o.   MRN: [id number]   [facility] Progress Note   Assessment/ Plan:   1.  Status post revision of left below-knee amputation: With intermittent breakthrough pain ongoing management as directed by orthopedic surgery.  Admitted to inpatient rehabilitation unit. 2. ESRD: He is on a MWF schedule as an outpatient and will undertake hemodialysis later in the day today as we continue to chip away at his volume excess with ultrafiltration as permitted by relative hypotension (for which he takes midodrine). 3. Anemia: Hemoglobin levels low, status post darbepoetin on Wednesday. 4. CKD-MBD: Calcium level acceptable with elevated phosphorus; will uptitrate Auryxia for better phosphorus binding. 5. Nutrition: Ongoing renal diet with protein supplementation and renal multivitamin.  Fluid restriction. 6. Hypertension: With episodes of intradialytic hypotension-on midodrine 10 mg twice daily.  Subjective:   Reports tolerable pain/discomfort over left below-knee amputation site.  Admitted to inpatient rehabilitation unit yesterday and uneventful overnight.   Objective:   BP 118/69 (BP Location: Right Arm)   Pulse 89   Temp (!) 97.5 F (36.4 C) (Oral)   Resp 18   Ht 5\' 11"  (1.803 m)   Wt 81.6 kg   SpO2 98%   BMI 25.09 kg/m   Physical Exam: Gen: Appears comfortable sitting up in wheelchair CVS: Pulse regular rhythm, normal rate, S1 and S2 normal Resp: Clear to auscultation bilaterally, no rales/rhonchi Abd: Soft, obese, nontender, bowel sounds normal Ext: Left BKA with shrinker, right leg 1+ edema.  Left upper arm AV fistula with positive thrill  Labs: BMET Recent Labs  Lab 07/29/21 0314 07/30/21 0657 07/31/21 1158 08/02/21 0835  NA 137 133* 136 132*  K 4.8 4.4 4.0 4.4  CL 97* 96* 96* 93*  CO2 30 29 31 27   GLUCOSE 90 90 86 74  BUN 55* 42* 29* 68*  CREATININE 5.99* 5.10* 3.69* 5.80*  CALCIUM 8.3* 8.1* 8.4* 8.6*   PHOS  --   --  4.8* 6.2*   CBC Recent Labs  Lab 07/29/21 0314 07/30/21 0657 07/31/21 1158 08/02/21 0836  WBC 7.9 6.4 6.0 6.5  HGB 8.2* 8.0* 8.7* 8.3*  HCT 27.6* 27.6* 28.8* 27.6*  MCV 105.7* 107.0* 105.5* 103.8*  PLT 116* 104* 122* 139*      Medications:     vitamin C  1,000 mg Oral Daily   aspirin EC  81 mg Oral Daily   atorvastatin  40 mg Oral Daily   Chlorhexidine Gluconate Cloth  6 each Topical Q0600   [START ON 08/07/2021] cinacalcet  30 mg Oral Weekly   clopidogrel  75 mg Oral Daily   [START ON 08/09/2021] darbepoetin ([name]) injection - DIALYSIS  200 mcg Intravenous Q Wed-HD   docusate sodium  100 mg Oral Daily   ferric citrate  210 mg Oral TID with meals   heparin  5,000 Units Subcutaneous Q8H   insulin aspart  0-15 Units Subcutaneous TID WC   insulin aspart  2 Units Subcutaneous TID WC   insulin glargine-yfgn  5 Units Subcutaneous QHS   midodrine  10 mg Oral BID WC   multivitamin  1 tablet Oral QHS   nutrition supplement (JUVEN)  1 packet Oral BID BM   pantoprazole  40 mg Oral Daily   traZODone  50 mg Oral QHS   venlafaxine XR  37.5 mg Oral QPM   zinc sulfate  220 mg Oral Daily     Elmarie Shiley, MD 08/04/2021, 10:54 AM

## 2021-08-04 NOTE — Evaluation (Signed)
Physical Therapy Assessment and Plan  Patient Details  Name: Maxwell Harmon MRN: [id number] Date of Birth: 12/08/1955  PT Diagnosis: Abnormal posture, Abnormality of gait, Difficulty walking, Edema, Impaired sensation, and Muscle weakness Rehab Potential: Good ELOS: 2 weeks   Today's Date: 08/04/2021 PT Individual Time: 0900-0955 PT Individual Time Calculation (min): 55 min    Hospital Problem: Principal Problem:   S/P BKA (below knee amputation) unilateral, left ([place]) Active Problems:   PAD (peripheral artery disease) ([place])   Past Medical History:  Past Medical History:  Diagnosis Date   Anemia of chronic disease    Anxiety    CAD (coronary artery disease)    Depression    Diabetes mellitus without complication ([place])    ESRD (end stage renal disease) on dialysis ([place])    MWF in [place]   History of bleeding ulcers    History of TIAs    Hypertension    PAD (peripheral artery disease) ([facility])    Renal cancer, right ([place])    s/p right radical nephrectomy 01/08/19   Past Surgical History:  Past Surgical History:  Procedure Laterality Date   ABDOMINAL AORTOGRAM W/LOWER EXTREMITY Bilateral 04/14/2021   Procedure: ABDOMINAL AORTOGRAM W/LOWER EXTREMITY;  Surgeon: Elam Dutch, MD;  Location: [place] [facility];  Service: Vascular;  Laterality: Bilateral;   AMPUTATION Left 05/12/2021   Procedure: LEFT BELOW KNEE AMPUTATION;  Surgeon: Newt Minion, MD;  Location: [facility];  Service: Orthopedics;  Laterality: Left;   CATARACT EXTRACTION W/PHACO Left 02/15/2014   Procedure: CATARACT EXTRACTION PHACO AND INTRAOCULAR LENS PLACEMENT (IOC);  Surgeon: Tonny Branch, MD;  Location: [facility];  Service: Ophthalmology;  Laterality: Left;  CDE 10.84   CATARACT EXTRACTION W/PHACO Right 12/14/2013   Procedure: CATARACT EXTRACTION PHACO AND INTRAOCULAR LENS PLACEMENT (IOC);  Surgeon: Tonny Branch, MD;  Location: [facility];  Service: Ophthalmology;  Laterality: Right;  CDE:  16.30   CHOLECYSTECTOMY   02/2013   EYE SURGERY     IR FLUORO GUIDE CV LINE RIGHT  01/05/2019   IR US GUIDE VASC ACCESS RIGHT  01/05/2019   LAPAROSCOPIC NEPHRECTOMY Right 01/08/2019   Procedure: LAPAROSCOPIC RADICAL NEPHRECTOMY;  Surgeon: Raynelle Bring, MD;  Location: [facility];  Service: Urology;  Laterality: Right;   LEFT HEART CATH AND CORS/GRAFTS ANGIOGRAPHY N/A 04/14/2021   Procedure: LEFT HEART CATH AND CORS/GRAFTS ANGIOGRAPHY;  Surgeon: Jolaine Artist, MD;  Location: [place] [facility];  Service: Cardiovascular;  Laterality: N/A;   PENILE PROSTHESIS IMPLANT     PERIPHERAL VASCULAR INTERVENTION Left 04/19/2021   Procedure: PERIPHERAL VASCULAR INTERVENTION;  Surgeon: Cherre Robins, MD;  Location: [place] [facility];  Service: Cardiovascular;  Laterality: Left;   STUMP REVISION Left 07/07/2021   Procedure: REVISION LEFT BELOW KNEE AMPUTATION;  Surgeon: Newt Minion, MD;  Location: [facility];  Service: Orthopedics;  Laterality: Left;   STUMP REVISION Left 07/28/2021   Procedure: REVISION LEFT BELOW KNEE AMPUTATION;  Surgeon: Newt Minion, MD;  Location: [place];  Service: Orthopedics;  Laterality: Left;   TOE AMPUTATION Right 2013   [facility]    Assessment & Plan Clinical Impression: Patient is a 65 y.o. year old male with history of HTN, renal cancer, ESRD- failed PD/now HD MWF, CAD s/p NSTEMI 04/2021, L-BKA ([name] 07/14-08/04) who has had problems with wound dehiscence and was admitted on 07/27/21 for revision of L-BKA by Dr. Sharol Given. Post op continues to have issues with hypotension, hypoxia, volume overload being managed by  HD as well as pain and depressive symptoms. Wound VAC remains in place without drainage and to stay in place for a couple more days per ortho. Urine output has decreased with I/O to <50 cc in > 24 hours therefore lasix discontinued. Therapy has been ongoing and patient continues to be limited by weakness and balance deficits. [name] recommended due to functional decline.   Patient currently  requires min with mobility secondary to muscle weakness, decreased cardiorespiratoy endurance, and decreased standing balance, decreased postural control, decreased balance strategies, and difficulty maintaining precautions.  Prior to hospitalization, patient was modified independent  with mobility and lived with Spouse in a House home.  Home access is  Ramped entrance.  Patient will benefit from skilled PT intervention to maximize safe functional mobility, minimize fall risk, and decrease caregiver burden for planned discharge home with 24 hour supervision.  Anticipate patient will benefit from follow up [place] at discharge.  PT - End of Session Activity Tolerance: Tolerates 30+ min activity with multiple rests Endurance Deficit: Yes Endurance Deficit Description: required rest breaks throughout PT Assessment Rehab Potential (ACUTE/IP ONLY): Good PT Barriers to Discharge: Wound Care;Hemodialysis;Weight bearing restrictions;New oxygen;Other (comments) PT Barriers to Discharge Comments: weakness/deconditioning, on 1L O2, poor wound healing PT Patient demonstrates impairments in the following area(s): Balance;Edema;Endurance;Motor;Nutrition;Pain;Perception;Sensory;Skin Integrity PT Transfers Functional Problem(s): Bed Mobility;Bed to Chair;Car;Furniture PT Locomotion Functional Problem(s): Ambulation;Wheelchair Mobility;Stairs PT Plan PT Intensity: Minimum of 1-2 x/day ,45 to 90 minutes PT Frequency: 5 out of 7 days PT Duration Estimated Length of Stay: 2 weeks PT Treatment/Interventions: Ambulation/gait training;Discharge planning;Functional mobility training;Psychosocial support;Therapeutic Activities;Balance/vestibular training;Disease management/prevention;Neuromuscular re-education;Skin care/wound management;Therapeutic Exercise;Wheelchair propulsion/positioning;DME/adaptive equipment instruction;Pain management;Splinting/orthotics;UE/LE Strength taining/ROM;Community reintegration;Patient/family  education;Stair training;UE/LE Coordination activities PT Transfers Anticipated Outcome(s): supervision with LRAD PT Locomotion Anticipated Outcome(s): min A with LRAD PT Recommendation Follow Up Recommendations: Home health PT Patient destination: Home Equipment Recommended: To be determined Equipment Details: has RW and WC  PT Evaluation Precautions/Restrictions Precautions Precautions: Fall Precaution Comments: wound vac Restrictions Weight Bearing Restrictions: Yes LLE Weight Bearing: Non weight bearing Pain Interference Pain Interference Pain Effect on Sleep: 2. Occasionally Pain Interference with Therapy Activities: 1. Rarely or not at all Pain Interference with Day-to-Day Activities: 1. Rarely or not at all Home Living/Prior [place]: Spouse/significant other Available Help at Discharge: Family;Available 24 hours/day Type of Home: House Home Access: Ramped entrance Home Layout: One level Bathroom Shower/Tub: Chiropodist: Standard Bathroom Accessibility: Yes Additional Comments: pt reports he was mainly WC bound. Would use RW to stand and pivot for transfers.  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 Wife assisting with dressing Vision/Perception  Vision - History Ability to See in Adequate Light: 0 Adequate Perception Perception: Within Functional Limits Praxis Praxis: Intact  Cognition Overall Cognitive Status: Within Functional Limits for tasks assessed Arousal/Alertness: Awake/alert Orientation Level: Oriented X4 Memory: Appears intact Awareness: Appears intact Problem Solving: Appears intact Safety/Judgment: Appears  intact Sensation Sensation Light Touch: Impaired by gross assessment Proprioception: Impaired by gross assessment Additional Comments: decreased along medial/lateral borders of L knee of residual limb and absent along incision. decreased fading to absent sensation along R medial/lateral malleoli and great toe. Coordination Gross Motor Movements are Fluid and Coordinated: No Fine Motor Movements are Fluid and Coordinated: No Coordination and Movement  Description: grossly uncoordinated due to altered balance strategies 2/2 L BKA, weakness/deconditioning, and decreased balance/postural control Finger Nose Finger Test: slow Heel Shin Test: unable to perform on LLE due to BKA, decreased ROM on RLE due to weakness Motor  Motor Motor: Abnormal postural alignment and control Motor - Skilled Clinical Observations: grossly uncoordinated due to altered balance strategies 2/2 L BKA, weakness/deconditioning, and decreased balance/postural control  Trunk/Postural Assessment  Cervical Assessment Cervical Assessment: Exceptions to [facility] (forward head) Thoracic Assessment Thoracic Assessment: Exceptions to [facility] (rounded shoulders) Lumbar Assessment Lumbar Assessment: Exceptions to [facility] (posterior pelvic tilt) Postural Control Postural Control: Deficits on evaluation  Balance Balance Balance Assessed: Yes Static Sitting Balance Static Sitting - Balance Support: Feet supported;Bilateral upper extremity supported Static Sitting - Level of Assistance: 5: Stand by assistance (supervision) Dynamic Sitting Balance Dynamic Sitting - Balance Support: No upper extremity supported;Feet supported Dynamic Sitting - Level of Assistance: 5: Stand by assistance (supervision) Static Standing Balance Static Standing - Balance Support: Bilateral upper extremity supported (RW) Static Standing - Level of Assistance: 4: Min assist Extremity Assessment  RLE Assessment RLE Assessment: Exceptions to [facility] RLE  Strength Right Hip Flexion: 4-/5 Right Hip ABduction: 4-/5 Right Hip ADduction: 4-/5 Right Knee Flexion: 3+/5 Right Knee Extension: 4-/5 Right Ankle Dorsiflexion: 3+/5 Right Ankle Plantar Flexion: 3+/5 LLE Assessment LLE Assessment: Exceptions to [facility] LLE Strength Left Hip Flexion: 4-/5 Left Hip ABduction: 3+/5 Left Hip ADduction: 3+/5 Left Knee Flexion: 3/5 Left Knee Extension: 3+/5  Care Tool Care Tool Bed Mobility Roll left and right activity   Roll left and right assist level: Contact Guard/Touching assist    Sit to lying activity   Sit to lying assist level: Contact Guard/Touching assist    Lying to sitting on side of bed activity   Lying to sitting on side of bed assist level: the ability to move from lying on the back to sitting on the side of the bed with no back support.: Contact Guard/Touching assist     Care Tool Transfers Sit to stand transfer   Sit to stand assist level: Moderate Assistance - Patient 50 - 74%    Chair/bed transfer   Chair/bed transfer assist level: Minimal Assistance - Patient > 75%     Psychologist, counselling transfer activity did not occur: Safety/medical concerns (weakness, decreased balance, fear of falling)        Care Tool Locomotion Ambulation Ambulation activity did not occur: Safety/medical concerns (weakness, decreased balance, fear of falling)        Walk 10 feet activity Walk 10 feet activity did not occur: Safety/medical concerns (weakness, decreased balance, fear of falling)       Walk 50 feet with 2 turns activity Walk 50 feet with 2 turns activity did not occur: Safety/medical concerns (weakness, decreased balance, fear of falling)      Walk 150 feet activity Walk 150 feet activity did not occur: Safety/medical concerns (weakness, decreased balance, fear of falling)      Walk 10 feet on uneven surfaces activity Walk 10 feet on uneven surfaces activity did not occur: Safety/medical concerns  (weakness, decreased balance, fear of falling)      Stairs Stair activity did not occur: Safety/medical concerns (weakness, decreased balance, fear of falling)        Walk up/down 1 step activity Walk up/down 1 step or curb (drop down) activity did not occur: Safety/medical concerns (weakness, decreased balance, fear of falling)  Walk up/down 4 steps activity did not occuR: Safety/medical concerns (weakness, decreased balance, fear of falling)  Walk up/down 4 steps activity      Walk up/down 12 steps activity Walk up/down 12 steps activity did not occur: Safety/medical concerns (weakness, decreased balance, fear of falling)      Pick up small objects from floor Pick up small object from the floor (from standing position) activity did not occur: Safety/medical concerns (weakness, decreased balance, fear of falling)      Wheelchair Is the patient using a wheelchair?: Yes Type of Wheelchair: Manual   Wheelchair assist level: Supervision/Verbal cueing Max wheelchair distance: 56f  Wheel 50 feet with 2 turns activity   Assist Level: Supervision/Verbal cueing  Wheel 150 feet activity   Assist Level: Dependent - Patient 0%    Refer to Care Plan for Long Term Goals  SHORT TERM GOAL WEEK 1 PT Short Term Goal 1 (Week 1): pt will transfer sit<>stand with LRAD and min A PT Short Term Goal 2 (Week 1): pt will transfer stand<>pivot with LRAD and min A PT Short Term Goal 3 (Week 1): pt will perform simulated car transfer with CGA  Recommendations for other services: None   Skilled Therapeutic Intervention Evaluation completed (see details above and below) with education on PT POC and goals and individual treatment initiated with focus on functional mobility/transfers, generalized strengthening, dynamic standing balance/coordination, and improved activity tolerance. Received pt sitting in WC, pt educated on PT evaluation, [name] policies, and therapy schedule and agreeable. Pt denied any  pain at rest but reported increased pain when standing. Pt on 2L O2 at rest with O2 sat 99% and HR 90 bpm. MD present for morning rounds and pt performed WC mobility [id number] BUE and supervision and [facility] to main therapy gym. Decreased O2 to 1L and O2 sat 97% - remained on 1L throughout session and left on 1L. Pt transferred WC<>mat and mat<>WC via lateral scoots with CGA/min A x 2 trials. Sit<>stand with RW from mat with mod A x 2 trials. While standing performed x 3 mini "hops" on RLE - pt unable to lift RLE completely. Attempted stand<>pivot with RW, however pt unable to lift RLE high enough to "hop" and unable to pivot due to reports of shoe sticking to floor. [facility]. Concluded session with pt sitting in WC, needs within reach, and seatbelt alarm on. Safety plan updated.  Mobility Transfers Transfers: Sit to Stand;Stand to Sit;Lateral/Scoot Transfers Sit to Stand: Moderate Assistance - Patient 50-74% Stand to Sit: Minimal Assistance - Patient > 75% Lateral/Scoot Transfers: Minimal Assistance - Patient > 75% Transfer (Assistive device): Rolling walker Locomotion  Gait Ambulation: No Gait Gait: No Stairs / Additional Locomotion Stairs: No Wheelchair Mobility Wheelchair Mobility: Yes Wheelchair Assistance: SuChartered loss adjusterBoth upper extremities Wheelchair Parts Management: Supervision/cueing Distance: [id number] Discharge Criteria: Patient will be discharged from PT if patient refuses treatment 3 consecutive times without medical reason, if treatment goals not met, if there is a change in medical status, if patient makes no progress towards goals or if patient is discharged from hospital.  The above assessment, treatment plan, treatment alternatives and goals were discussed and mutually agreed upon: by patient  AnnAlfonse Alpers, DPT  08/04/2021, 12:16 PM

## 2021-08-04 NOTE — Progress Notes (Addendum)
Inpatient Rehabilitation Medication Review by a Pharmacist  A complete drug regimen review was completed for this patient to identify any potential clinically significant medication issues.  High Risk Drug Classes Is patient taking? Indication by Medication  Antipsychotic Yes Compazine for nausea  Anticoagulant Yes SQ heparin for VTE prophx.  Antibiotic No   Opioid Yes  Oxy, Dilaudid f/p BKA revision pain  Antiplatelet Yes ASA, Plavix for PAD, CAD  Hypoglycemics/insulin Yes Semglee, meal coverage, SSI for DM  Vasoactive Medication Yes Midodrine for chronic hypotension (with HD)  Chemotherapy No   Other No      Type of Medication Issue Identified Description of Issue Recommendation(s)  Drug Interaction(s) (clinically significant)     Duplicate Therapy     Allergy     No Medication Administration End Date     Incorrect Dose  Trental Lipitor Midodrine Trental: Was supposed to be d/c'd. Lipitor should be 80mg /d, Midodrine should be 2.5mg  MWF with HD,   Additional Drug Therapy Needed  Lasix, Seroquel Lasix 80mg  BID, Seroquel 50mg /hs  Significant med changes from prior encounter (inform family/care partners about these prior to discharge).    Other       Clinically significant medication issues were identified that warrant physician communication and completion of prescribed/recommended actions by midnight of the next day:  Yes  Name of provider notified for urgent issues identified: Raulkar MD, Algis Liming, PA  Provider Method of Notification: chat  Pharmacist comments:  Lasix no longer needed due to anuria Hold Seroquel due to lethargy Con't increased dose of midodrine due to chronic hypotension Lipitor: dose decreased inpatient. Con't 40mg /d  Time spent performing this drug regimen review (minutes):  59min  Maxwell Harmon, PharmD, BCPS Clinical Staff Pharmacist [email or web address] [place] 08/03/2021 4:07 PM

## 2021-08-04 NOTE — Progress Notes (Signed)
PROGRESS NOTE   Subjective/Complaints: No new complaints this morning About to work with Vicente Males PT Discussed that Cbgs have been elevated- provided dietary guidance  ROS: denies pain   Objective:   No results found. Recent Labs    08/02/21 0836  WBC 6.5  HGB 8.3*  HCT 27.6*  PLT 139*   Recent Labs    08/02/21 0835  NA 132*  K 4.4  CL 93*  CO2 27  GLUCOSE 74  BUN 68*  CREATININE 5.80*  CALCIUM 8.6*    Intake/Output Summary (Last 24 hours) at 08/04/2021 1117 Last data filed at 08/04/2021 0959 Gross per 24 hour  Intake 360 ml  Output 0 ml  Net 360 ml     Pressure Injury 06/16/21 Sacrum Mid Stage 1 -  Intact skin with non-blanchable redness of a localized area usually over a bony prominence. (Active)  06/16/21 1310  Location: Sacrum  Location Orientation: Mid  Staging: Stage 1 -  Intact skin with non-blanchable redness of a localized area usually over a bony prominence.  Wound Description (Comments):   Present on Admission: Yes    Physical Exam: Vital Signs Blood pressure 118/69, pulse 89, temperature (!) 97.5 F (36.4 C), temperature source Oral, resp. rate 18, height 5\' 11"  (1.803 m), weight 81.6 kg, SpO2 98 %. Gen: no distress, normal appearing HEENT: oral mucosa pink and moist, NCAT Cardio: Reg rate Pulmonary:     Effort: Pulmonary effort is normal. No respiratory distress.     Breath sounds: No wheezing.     Comments: O2 via [place] Abdominal:     General: Abdomen is flat. There is no distension.     Palpations: Abdomen is soft.     Tenderness: There is no abdominal tenderness.     Comments: Umbilical incision with steri strips and diffuse ecchymosis.   Musculoskeletal:        General: Normal range of motion.     Cervical back: Normal range of motion.     Comments: Left knuckles with multiple scabs. Multiple bruises on BUE. L-BKA with compressive stocking and wound VAC in place.  Pes planus  deformity right foot without skin breakdown.   Skin:    Coloration: Skin is pale.  Neurological:     Mental Status: He is alert and oriented to person, place, and time.     Comments: Normal language, no focal cn findings. UE grossly 4+/5. RLE 3+ prox to 4+/5 distally, LLE 2/5 proximally and limited by pain. Decreased sensation to PP and LT right foot.  Psychiatric:     Comments: A little flat but generally cooperative      Assessment/Plan: 1. Functional deficits which require 3+ hours per day of interdisciplinary therapy in a comprehensive inpatient rehab setting. Physiatrist is providing close team supervision and 24 hour management of active medical problems listed below. Physiatrist and rehab team continue to assess barriers to discharge/monitor patient progress toward functional and medical goals  Care Tool:  Bathing    Body parts bathed by patient: Right arm, Left arm, Chest, Abdomen, Front perineal area, Buttocks, Right upper leg, Left upper leg, Face, Right lower leg  Bathing assist Assist Level: Supervision/Verbal cueing     Upper Body Dressing/Undressing Upper body dressing   What is the patient wearing?: Pull over shirt    Upper body assist Assist Level: Minimal Assistance - Patient > 75%    Lower Body Dressing/Undressing Lower body dressing      What is the patient wearing?: Ace wrap/stump shrinker     Lower body assist Assist for lower body dressing: Moderate Assistance - Patient 50 - 74% (lateral leans to don shorts)     Toileting Toileting    Toileting assist Assist for toileting: Contact Guard/Touching assist     Transfers Chair/bed transfer  Transfers assist     Chair/bed transfer assist level: Minimal Assistance - Patient > 75%     Locomotion Ambulation   Ambulation assist              Walk 10 feet activity   Assist           Walk 50 feet activity   Assist           Walk 150 feet activity   Assist            Walk 10 feet on uneven surface  activity   Assist           Wheelchair     Assist               Wheelchair 50 feet with 2 turns activity    Assist            Wheelchair 150 feet activity     Assist          Blood pressure 118/69, pulse 89, temperature (!) 97.5 F (36.4 C), temperature source Oral, resp. rate 18, height 5\' 11"  (1.803 m), weight 81.6 kg, SpO2 98 %.  Medical Problem List and Plan: 1.  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  Impaired mobility -DVT/anticoagulation:  Pharmaceutical: Heparin added             -antiplatelet therapy: Continue ASA/Plavix 3. Pain: Decrease Diluadid to 0.5mg  q4H prn. Oxycodone prn.               --Schedule gabapentin daily at HS (instead of prn) 4. Mood: LCSW to follow for evaluation and support.              -antipsychotic agents: N/A 5. Neuropsych: This patient is capable of making decisions on his own behalf. 6. Skin/Wound Care: Continue wound VAC for couple more days per ortho              --continue Vitamin C and Zinc with juven bid to promote wound healing.  7. Fluids/Electrolytes/Nutrition: Strict I/O. Routine check of labs with HD. 8. T2DM: Monitor BS ac/hs and use SSI for elevated BS             --Continue insulin Glargine with 2 units Novolog TID 9. ESRD: HD MWF at the end of the day to help with tolerance of therapy.              --on Sensipar and ferric citrate for metabolic bone disease.  10. Anemia of chronic disease: Started on weekly [name] 09/28.              --  routine H/H check with HD.  11. Hypotension: Monitor on Midodrine BID.  12. Depression with acute reaction?: On Effexor with trazodone to help with sleep.              --Klonopin prn for anxiety.  13. CAD: On ASA/Plavix and Lipitor. Monitor for  symptoms with increase in activity.  14.  PAD: Continue Trental TID.         LOS: 1 days A FACE TO FACE EVALUATION WAS PERFORMED  Gabbrielle Mcnicholas P Maleiah Dula 08/04/2021, 11:17 AM

## 2021-08-04 NOTE — Progress Notes (Signed)
Occupational Therapy Session Note  Patient Details  Name: Maxwell Harmon MRN: [id number] Date of Birth: 05-Oct-1956  Today's Date: 08/04/2021 OT Individual Time: 8295-6213 OT Individual Time Calculation (min): 54 min    Short Term Goals: Week 1:  OT Short Term Goal 1 (Week 1): Pt will perform LB dressing with MIN A OT Short Term Goal 2 (Week 1): Pt will complete MIN A stand > pivot transfer with LRAD to [facility] OT Short Term Goal 3 (Week 1): Pt will tolerate activity for 5 minutes w/o requiring external O2 support  Skilled Therapeutic Interventions/Progress Updates:    Treatment session with focus on functional transfers and BUE strengthening.  Pt received seated upright in w/c reporting fatigue and pain in residual limb 4/10.  Pt transported to therapy gym on 1L O2.  Pt sats 100% on 1L during session. Engaged in [place] with 1kg medicine ball 2 sets of 10 chest presses, overhead presses, and PNF pattern diagonals.  Completed 2 sets of 10 bicep curls and lateral rows with 3# dowel rod.  Pt required rest breaks between each set. Pt reports increased fatigue, therefore returned to room.  Pt completed squat pivot/lateral scoot transfer back to bed (to L) with max assist due to increased fatigue and decreased weight shifting towards residual limb.  Pt remained semi-reclined in bed with all needs in reach. RN called to request pain meds.  Therapy Documentation Precautions:  Precautions Precautions: Fall Precaution Comments:  (Charcot boot for R LE (wife to bring from home)) Restrictions Weight Bearing Restrictions: Yes LLE Weight Bearing: Non weight bearing Vital Signs: Oxygen Therapy O2 Flow Rate (L/min): 2 L/min Pain: Pt with c/o pain 4/10 in residual limb. Pt requested pain meds at end of session.    Therapy/Group: Individual Therapy  Simonne Come 08/04/2021, 11:38 AM

## 2021-08-04 NOTE — Progress Notes (Signed)
Inpatient Rehabilitation Care Coordinator Assessment and Plan Patient Details  Name: Maxwell Harmon MRN: [id number] Date of Birth: [birth year]  Today's Date: 08/04/2021  Hospital Problems: Principal Problem:   S/P BKA (below knee amputation) unilateral, left ([place]) Active Problems:   PAD (peripheral artery disease) ([place])  Past Medical History:  Past Medical History:  Diagnosis Date   Anemia of chronic disease    Anxiety    CAD (coronary artery disease)    Depression    Diabetes mellitus without complication ([place])    ESRD (end stage renal disease) on dialysis ([place])    MWF in [place]   History of bleeding ulcers    History of TIAs    Hypertension    PAD (peripheral artery disease) ([facility])    Renal cancer, right ([place])    s/p right radical nephrectomy 01/08/19   Past Surgical History:  Past Surgical History:  Procedure Laterality Date   ABDOMINAL AORTOGRAM W/LOWER EXTREMITY Bilateral 04/14/2021   Procedure: ABDOMINAL AORTOGRAM W/LOWER EXTREMITY;  Surgeon: Elam Dutch, MD;  Location: [facility];  Service: Vascular;  Laterality: Bilateral;   AMPUTATION Left 05/12/2021   Procedure: LEFT BELOW KNEE AMPUTATION;  Surgeon: Newt Minion, MD;  Location: [facility];  Service: Orthopedics;  Laterality: Left;   CATARACT EXTRACTION W/PHACO Left 02/15/2014   Procedure: CATARACT EXTRACTION PHACO AND INTRAOCULAR LENS PLACEMENT (IOC);  Surgeon: Tonny Branch, MD;  Location: [facility];  Service: Ophthalmology;  Laterality: Left;  CDE 10.84   CATARACT EXTRACTION W/PHACO Right 12/14/2013   Procedure: CATARACT EXTRACTION PHACO AND INTRAOCULAR LENS PLACEMENT (IOC);  Surgeon: Tonny Branch, MD;  Location: [facility];  Service: Ophthalmology;  Laterality: Right;  CDE:  16.30   CHOLECYSTECTOMY  02/2013   EYE SURGERY     IR FLUORO GUIDE CV LINE RIGHT  01/05/2019   IR US GUIDE VASC ACCESS RIGHT  01/05/2019   LAPAROSCOPIC NEPHRECTOMY Right 01/08/2019   Procedure: LAPAROSCOPIC RADICAL NEPHRECTOMY;  Surgeon: Raynelle Bring, MD;  Location: [facility];  Service: Urology;  Laterality: Right;   LEFT HEART CATH AND CORS/GRAFTS ANGIOGRAPHY N/A 04/14/2021   Procedure: LEFT HEART CATH AND CORS/GRAFTS ANGIOGRAPHY;  Surgeon: Jolaine Artist, MD;  Location: [facility];  Service: Cardiovascular;  Laterality: N/A;   PENILE PROSTHESIS IMPLANT     PERIPHERAL VASCULAR INTERVENTION Left 04/19/2021   Procedure: PERIPHERAL VASCULAR INTERVENTION;  Surgeon: Cherre Robins, MD;  Location: [facility];  Service: Cardiovascular;  Laterality: Left;   STUMP REVISION Left 07/07/2021   Procedure: REVISION LEFT BELOW KNEE AMPUTATION;  Surgeon: Newt Minion, MD;  Location: [facility];  Service: Orthopedics;  Laterality: Left;   STUMP REVISION Left 07/28/2021   Procedure: REVISION LEFT BELOW KNEE AMPUTATION;  Surgeon: Newt Minion, MD;  Location: [facility];  Service: Orthopedics;  Laterality: Left;   TOE AMPUTATION Right 2013   [facility]   Social History:  reports that he has never smoked. He has never used smokeless tobacco. He reports current alcohol use. He reports that he does not use drugs.  Family / Support Systems Marital Status: Married Patient Roles: Spouse Spouse/Significant Other: Angie Anticipated Caregiver: Spouse Ability/Limitations of Caregiver: Min A Caregiver Availability: 24/7  Social History Preferred language: English Religion: Christian Writes: Yes Legal History/Current Legal Issues: n/a Guardian/Conservator: n/a   Abuse/Neglect Abuse/Neglect Assessment Can Be Completed: Yes Physical Abuse: Denies Verbal Abuse: Denies Sexual Abuse: Denies Exploitation of patient/patient's resources: Denies Self-Neglect: Denies  Patient response to: Social Isolation -  How often do you feel lonely or isolated from those around you?: Never  Emotional Status Recent Psychosocial Issues: anxiety, depression, coping Psychiatric History: n/a Substance Abuse History: n/a  Patient / Family  Perceptions, Expectations & Goals Pt/Family understanding of illness & functional limitations: yes Premorbid pt/family roles/activities: Required some assistance previosuly with ADLS, mobility, and money and medication management Anticipated changes in roles/activities/participation: spouse will continue to provide assitance Pt/family expectations/goals: Min A Wheelchair level  [place] Agencies: None Premorbid Home Care/DME Agencies: Other (Comment) (Wheelchair, BSC, Shower chair, grab bars, single point cane, rolling walker) Transportation available at discharge: family able to transport Is the patient able to respond to transportation needs?: Yes In the past 12 months, has lack of transportation kept you from medical appointments or from getting medications?: No In the past 12 months, has lack of transportation kept you from meetings, work, or from getting things needed for daily living?: No Resource referrals recommended: Neuropsychology (anxiety/depression)  Discharge Planning Living Arrangements: Spouse/significant other Support Systems: Spouse/significant other Type of Residence: Private residence (1 level home, ramp entrance) Insurance Resources: Multimedia programmer (specify), Chartered certified accountant Resources: Employment Financial Screen Referred: No Money Management: Patient, Spouse Does the patient have any problems obtaining your medications?: No Home Management: Pt requires assistance with money and medicaition mang Patient/Family Preliminary Plans: family will continue Care Coordinator Barriers to Discharge: Insurance for SNF coverage Care Coordinator Anticipated Follow Up Needs: HH/OP Expected length of stay: 12-14 Days  Clinical Impression Sw met with pt, introduced self, explained role and addressed questions and concerns. Pt plans to return home. No additional questions or concerns.  [name] 08/04/2021, 1:01 PM

## 2021-08-04 NOTE — Progress Notes (Signed)
Patient with hypoglycemia--anticipate BS to trend lower with increase in activity. Will d/c 2 units of meal coverage and change SSI to sensitive scale.

## 2021-08-04 NOTE — Progress Notes (Signed)
Hypoglycemic Event  CBG: 49 at 1246  Treatment: Orange juice, soda, lunch, and oral glucose gel.  Symptoms: None  Follow-up CBG: Time: 55 at 1230 48 1236 completed lunch, oral gel, soda, and orange juice CBG increased to 70 at 1311  Possible Reasons for Event: Unknown  Comments/MD notified: Protocol followed and discussed steps with [name], PA. New orders implemented.    Marlowe Kays

## 2021-08-04 NOTE — Progress Notes (Signed)
Inpatient Rehabilitation Center Individual Statement of Services  Patient Name:  Maxwell Harmon  Date:  08/04/2021  Welcome to the [facility].  Our goal is to provide you with an individualized program based on your diagnosis and situation, designed to meet your specific needs.  With this comprehensive rehabilitation program, you will be expected to participate in at least 3 hours of rehabilitation therapies Monday-Friday, with modified therapy programming on the weekends.  Your rehabilitation program will include the following services:  Physical Therapy (PT), Occupational Therapy (OT), Speech Therapy (ST), 24 hour per day rehabilitation nursing, Therapeutic Recreaction (TR), Neuropsychology, Care Coordinator, Rehabilitation Medicine, Nutrition Services, Pharmacy Services, and Other  Weekly team conferences will be held on Tuesdays to discuss your progress.  Your Inpatient Rehabilitation Care Coordinator will talk with you frequently to get your input and to update you on team discussions.  Team conferences with you and your family in attendance may also be held.  Expected length of stay:  12-14 Days  Overall anticipated outcome:  Min A  Depending on your progress and recovery, your program may change. Your Inpatient Rehabilitation Care Coordinator will coordinate services and will keep you informed of any changes. Your Inpatient Rehabilitation Care Coordinator's name and contact numbers are listed  below.  The following services may also be recommended but are not provided by the [facility]:   [place] will be made to provide these services after discharge if needed.  Arrangements include referral to agencies that provide these services.  Your insurance has been verified to be:   [place] primary doctor is:   [name], DO  Pertinent information will be  shared with your doctor and your insurance company.  Inpatient Rehabilitation Care Coordinator:  Erlene Quan, [place] or [phone]  Information discussed with and copy given to patient by: [name], 08/04/2021, 11:49 AM

## 2021-08-05 LAB — GLUCOSE, CAPILLARY
Glucose-Capillary: 119 mg/dL — ABNORMAL HIGH (ref 70–99)
Glucose-Capillary: 130 mg/dL — ABNORMAL HIGH (ref 70–99)
Glucose-Capillary: 125 mg/dL — ABNORMAL HIGH (ref 70–99)
Glucose-Capillary: 146 mg/dL — ABNORMAL HIGH (ref 70–99)

## 2021-08-05 NOTE — Progress Notes (Signed)
PROGRESS NOTE   Subjective/Complaints:  Patient had hemodialysis yesterday.  No new complaints today. No significant stump pain, positive constipation although nursing records BM yesterday and the day before ROS: denies pain   Objective:   No results found. Recent Labs    08/04/21 1208  WBC 5.3  HGB 8.9*  HCT 29.5*  PLT 152    Recent Labs    08/04/21 1208  NA 131*  K 3.9  CL 93*  CO2 27  GLUCOSE 53*  BUN 62*  CREATININE 5.69*  CALCIUM 8.6*     Intake/Output Summary (Last 24 hours) at 08/05/2021 1158 Last data filed at 08/05/2021 0804 Gross per 24 hour  Intake 840 ml  Output 3500 ml  Net -2660 ml      Pressure Injury 06/16/21 Sacrum Mid Stage 1 -  Intact skin with non-blanchable redness of a localized area usually over a bony prominence. (Active)  06/16/21 1310  Location: Sacrum  Location Orientation: Mid  Staging: Stage 1 -  Intact skin with non-blanchable redness of a localized area usually over a bony prominence.  Wound Description (Comments):   Present on Admission: Yes    Physical Exam: Vital Signs Blood pressure 118/68, pulse 81, temperature 97.8 F (36.6 C), temperature source Oral, resp. rate 14, height 5\' 11"  (1.803 m), weight 78.2 kg, SpO2 97 %.  General: No acute distress Mood and affect are appropriate Heart: Regular rate and rhythm no rubs murmurs or extra sounds Lungs: Clear to auscultation, breathing unlabored, no rales or wheezes Abdomen: Positive bowel sounds, soft nontender to palpation, nondistended Extremities: No clubbing, cyanosis, or edema  Musculoskeletal:        General: Normal range of motion.     Cervical back: Normal range of motion.     Comments: Left knuckles with multiple scabs. Multiple bruises on BUE. L-BKA with compressive stocking and wound VAC in place.  Pes planus deformity right foot without skin breakdown.   Skin:    Coloration: Skin is pale.   Neurological:     Mental Status: He is alert and oriented to person, place, and time.     Comments: Normal language, no focal cn findings. UE grossly 4+/5. RLE 3+ prox to 4+/5 distally, LLE 2/5 proximally and limited by pain. Decreased sensation to PP and LT right foot.  Psychiatric:     Comments: A little flat but generally cooperative      Assessment/Plan: 1. Functional deficits which require 3+ hours per day of interdisciplinary therapy in a comprehensive inpatient rehab setting. Physiatrist is providing close team supervision and 24 hour management of active medical problems listed below. Physiatrist and rehab team continue to assess barriers to discharge/monitor patient progress toward functional and medical goals  Care Tool:  Bathing    Body parts bathed by patient: Right arm, Left arm, Chest, Abdomen, Front perineal area, Buttocks, Right upper leg, Left upper leg, Face, Right lower leg         Bathing assist Assist Level: Supervision/Verbal cueing     Upper Body Dressing/Undressing Upper body dressing   What is the patient wearing?: Pull over shirt    Upper body assist Assist Level: Minimal Assistance -  Patient > 75%    Lower Body Dressing/Undressing Lower body dressing      What is the patient wearing?: Ace wrap/stump shrinker     Lower body assist Assist for lower body dressing: Maximal Assistance - Patient 25 - 49%     Toileting Toileting    Toileting assist Assist for toileting: Contact Guard/Touching assist     Transfers Chair/bed transfer  Transfers assist     Chair/bed transfer assist level: Moderate Assistance - Patient 50 - 74%     Locomotion Ambulation   Ambulation assist   Ambulation activity did not occur: Safety/medical concerns (weakness, decreased balance, fear of falling)          Walk 10 feet activity   Assist  Walk 10 feet activity did not occur: Safety/medical concerns (weakness, decreased balance, fear of  falling)        Walk 50 feet activity   Assist Walk 50 feet with 2 turns activity did not occur: Safety/medical concerns (weakness, decreased balance, fear of falling)         Walk 150 feet activity   Assist Walk 150 feet activity did not occur: Safety/medical concerns (weakness, decreased balance, fear of falling)         Walk 10 feet on uneven surface  activity   Assist Walk 10 feet on uneven surfaces activity did not occur: Safety/medical concerns (weakness, decreased balance, fear of falling)         Wheelchair     Assist Is the patient using a wheelchair?: Yes Type of Wheelchair: Manual    Wheelchair assist level: Supervision/Verbal cueing Max wheelchair distance: 6ft    Wheelchair 50 feet with 2 turns activity    Assist        Assist Level: Supervision/Verbal cueing   Wheelchair 150 feet activity     Assist      Assist Level: Dependent - Patient 0%   Blood pressure 118/68, pulse 81, temperature 97.8 F (36.6 C), temperature source Oral, resp. rate 14, height 5\' 11"  (1.803 m), weight 78.2 kg, SpO2 97 %.  Medical Problem List and Plan: 1.  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  Impaired mobility -DVT/anticoagulation:  Pharmaceutical: Heparin added             -antiplatelet therapy: Continue ASA/Plavix 3. Pain: Decrease Diluadid to 0.5mg  q4H prn. Oxycodone prn.               --Schedule gabapentin daily at HS (instead of prn) 4. Mood: LCSW to follow for evaluation and support.              -antipsychotic agents: N/A 5. Neuropsych: This patient is capable of making decisions on his own behalf. 6. Skin/Wound Care: Continue wound VAC for couple more days per ortho              --continue Vitamin C and Zinc with juven bid to promote wound healing.  7.  Fluids/Electrolytes/Nutrition: Strict I/O. Routine check of labs with HD. 8. T2DM: Monitor BS ac/hs and use SSI for elevated BS             --Continue insulin Glargine with 2 units Novolog TID CBG (last 3)  Recent Labs  08/04/21 2134 08/05/21 0606 08/05/21 1123  GLUCAP 125* 119* 130*  Controlled, 10/1  9. ESRD: HD MWF at the end of the day to help with tolerance of therapy.              --on Sensipar and ferric citrate for metabolic bone disease.  10. Anemia of chronic disease: Started on weekly [name] 09/28.              --routine H/H check with HD.  11. Hypotension: Monitor on Midodrine BID.  12. Depression with acute reaction?: On Effexor with trazodone to help with sleep.              --Klonopin prn for anxiety.  13. CAD: On ASA/Plavix and Lipitor. Monitor for symptoms with increase in activity.  14.  PAD: Continue Trental TID.  15.  History of constipation had known BMs between 926 but then had several on 929 and had 1 yesterday we will continue to monitor on current medications, Colace 100 mg/day and as needed bisacodyl suppository      LOS: 2 days A FACE TO FACE EVALUATION WAS PERFORMED  Charlett Blake 08/05/2021, 11:58 AM

## 2021-08-06 LAB — GLUCOSE, CAPILLARY
Glucose-Capillary: 114 mg/dL — ABNORMAL HIGH (ref 70–99)
Glucose-Capillary: 110 mg/dL — ABNORMAL HIGH (ref 70–99)
Glucose-Capillary: 113 mg/dL — ABNORMAL HIGH (ref 70–99)
Glucose-Capillary: 158 mg/dL — ABNORMAL HIGH (ref 70–99)

## 2021-08-06 NOTE — IPOC Note (Signed)
Overall Plan of Care [facility]) Patient Details Name: Maxwell Harmon MRN: [id number] DOB: [birth year]  Admitting Diagnosis: S/P BKA (below knee amputation) unilateral, left [facility])  Hospital Problems: Principal Problem:   S/P BKA (below knee amputation) unilateral, left ([place]) Active Problems:   PAD (peripheral artery disease) ([place])     Functional Problem List: Nursing Bladder, Bowel, Edema, Endurance, Medication Management, Pain, Safety, Sensory, Skin Integrity  PT Balance, Edema, Endurance, Motor, Nutrition, Pain, Perception, Sensory, Skin Integrity  OT Balance, Safety, Skin Integrity, Edema, Endurance  SLP    TR         Basic ADL's: OT Grooming, Bathing, Dressing, Toileting     Advanced  ADL's: OT Simple Meal Preparation, Full Meal Preparation, Laundry, Light Housekeeping     Transfers: PT Bed Mobility, Bed to Chair, Car, Manufacturing systems engineer, Metallurgist: PT Ambulation, Emergency planning/management officer, Stairs     Additional Impairments: OT None  SLP        TR      Anticipated Outcomes Item Anticipated Outcome  Self Feeding No goal  Swallowing      Basic self-care  Media planner Transfers Supervision  Bowel/Bladder  min assist  Transfers  supervision with LRAD  Locomotion  min A with LRAD  Communication     Cognition     Pain  < 3  Safety/Judgment  min assist   Therapy Plan: PT Intensity: Minimum of 1-2 x/day ,45 to 90 minutes PT Frequency: 5 out of 7 days PT Duration Estimated Length of Stay: 2 weeks OT Intensity: Minimum of 1-2 x/day, 45 to 90 minutes OT Frequency: 5 out of 7 days OT Duration/Estimated Length of Stay: 2-2.5 weeks     Due to the current state of emergency, patients may not be receiving their 3-hours of Medicare-mandated therapy.   Team Interventions: Nursing Interventions Patient/Family Education, Bladder Management, Bowel Management, Disease Management/Prevention, Pain Management,  Medication Management, Skin Care/Wound Management, Discharge Planning  PT interventions Ambulation/gait training, Discharge planning, Functional mobility training, Psychosocial support, Therapeutic Activities, Balance/vestibular training, Disease management/prevention, Neuromuscular re-education, Skin care/wound management, Therapeutic Exercise, Wheelchair propulsion/positioning, DME/adaptive equipment instruction, Pain management, Splinting/orthotics, UE/LE Strength taining/ROM, Community reintegration, Barrister's clerk education, IT trainer, UE/LE Coordination activities  OT Interventions Training and development officer, Cognitive remediation/compensation, Discharge planning, Disease mangement/prevention, DME/adaptive equipment instruction, Functional mobility training, Pain management, Patient/family education, Psychosocial support, Self Care/advanced ADL retraining, Skin care/wound managment, Therapeutic Activities, Therapeutic Exercise, UE/LE Strength taining/ROM, UE/LE Coordination activities, Wheelchair propulsion/positioning  SLP Interventions    TR Interventions    SW/CM Interventions Discharge Planning, Psychosocial Support, Patient/Family Education, Disease Management/Prevention   Barriers to Discharge MD  Medical stability, Home enviroment access/loayout, Neurogenic bowel and bladder, Wound care, Lack of/limited family support, Weight, Hemodialysis, Weight bearing restrictions, and Behavior  Nursing Decreased caregiver support, Home environment access/layout, Incontinence, Wound Care, Lack of/limited family support, Hemodialysis, Weight bearing restrictions, Medication compliance Known to [name]. Lives in 1 level home with ramped entrance. Lives with spouse who can provide min assist at discharge.  PT Wound Care, Hemodialysis, Weight bearing restrictions, New oxygen, Other (comments) weakness/deconditioning, on 1L O2, poor wound healing  OT New oxygen, Weight bearing restrictions, Wound Care     SLP      SW Insurance for SNF coverage     Team Discharge Planning: Destination: PT-Home ,OT- Home , SLP-  Projected Follow-up: PT-Home health PT, OT-  Home health OT, SLP-  Projected Equipment Needs: PT-To be determined, OT- Other (comment),  SLP-  Equipment Details: PT-has RW and WC, OT-Pt has BSC, cane, TTB, RW + wc Patient/family involved in discharge planning: PT- Patient,  OT-Patient, SLP-   MD ELOS: 2 weeks Medical Rehab Prognosis:  Good Assessment: Pt is a 65 yr old male with L BKA revision and DM, ESRD on HD M/W/F?, and PAD; -also with constipation.   Goals supervision to min A    See Team Conference Notes for weekly updates to the plan of care

## 2021-08-06 NOTE — Progress Notes (Signed)
Patient ID: Maxwell Harmon, male   DOB: [birth year], 65 y.o.   MRN: [id number]  [facility] Progress Note   Assessment/ Plan:   1.  Status post revision of left below-knee amputation: With intermittent breakthrough pain ongoing management as directed by orthopedic surgery.  Admitted to inpatient rehabilitation unit. 2. ESRD: He is on a MWF schedule as an outpatient and will undertake hemodialysis tomorrow with continued efforts at ultrafiltration as permitted by relative hypotension (for which he takes midodrine). 3. Anemia: Hemoglobin levels low, resumed [name] on Wednesday this past week.. 4. CKD-MBD: Calcium level acceptable with improving phosphorus levels on Auryxia. 5. Nutrition: Ongoing renal diet with protein supplementation and renal multivitamin.  Fluid restriction. 6. Hypertension: With episodes of intradialytic hypotension-on midodrine 10 mg twice daily.  Subjective:   Complains of intermittent left below-knee amputation site pain/discomfort overnight.  Experienced some nausea this morning.   Objective:   BP 115/63 (BP Location: Right Arm)   Pulse 80   Temp (!) 97.4 F (36.3 C) (Oral)   Resp 18   Ht 5\' 11"  (1.803 m)   Wt 82.1 kg   SpO2 100%   BMI 25.24 kg/m   Physical Exam: Gen: Appears uncomfortable sitting up in wheelchair CVS: Pulse regular rhythm, normal rate, S1 and S2 normal Resp: Clear to auscultation bilaterally, no rales/rhonchi Abd: Soft, obese, nontender, bowel sounds normal Ext: Left BKA with shrinker, right leg trace-1+ edema.  Left upper arm AV fistula with positive thrill  Labs: BMET Recent Labs  Lab 07/31/21 1158 08/02/21 0835 08/04/21 1208  NA 136 132* 131*  K 4.0 4.4 3.9  CL 96* 93* 93*  CO2 31 27 27   GLUCOSE 86 74 53*  BUN 29* 68* 62*  CREATININE 3.69* 5.80* 5.69*  CALCIUM 8.4* 8.6* 8.6*  PHOS 4.8* 6.2* 4.5   CBC Recent Labs  Lab 07/31/21 1158 08/02/21 0836 08/04/21 1208  WBC 6.0 6.5 5.3  HGB 8.7* 8.3* 8.9*  HCT  28.8* 27.6* 29.5*  MCV 105.5* 103.8* 102.4*  PLT 122* 139* 152      Medications:     vitamin C  1,000 mg Oral Daily   aspirin EC  81 mg Oral Daily   atorvastatin  40 mg Oral Daily   [START ON 08/07/2021] cinacalcet  30 mg Oral Weekly   clopidogrel  75 mg Oral Daily   [START ON 08/09/2021] darbepoetin ([name]) injection - DIALYSIS  200 mcg Intravenous Q Wed-HD   docusate sodium  100 mg Oral Daily   ferric citrate  420 mg Oral TID with meals   heparin  5,000 Units Subcutaneous Q8H   insulin aspart  0-6 Units Subcutaneous TID WC   insulin glargine-yfgn  5 Units Subcutaneous QHS   midodrine  10 mg Oral BID WC   multivitamin  1 tablet Oral QHS   nutrition supplement (JUVEN)  1 packet Oral BID BM   pantoprazole  40 mg Oral Daily   traZODone  50 mg Oral QHS   venlafaxine XR  37.5 mg Oral QPM   zinc sulfate  220 mg Oral Daily     Elmarie Shiley, MD 08/06/2021, 9:28 AM

## 2021-08-06 NOTE — Progress Notes (Signed)
Physical Therapy Session Note  Patient Details  Name: Maxwell Harmon MRN: [id number] Date of Birth: 03-01-1956  Today's Date: 08/06/2021 PT Individual Time: 6384-6659 and 1400-1429 PT Individual Time Calculation (min): 72 min and 29 min.  Short Term Goals: Week 4:     Skilled Therapeutic Interventions/Progress Updates:   First session:  P{t presents sitting in w/c, handed off from OT.  Pt required 02 tank.  Frequent monitoring of O2 sats during rx.  Pt wheeled w/c around to face forward and then c/o nausea.  Emesis onto bag, and then states feels better.  Pt performed 3 x 10-15 LAQ, hip flexion and abd/add w/ rest breaks required 2/2 decreased O2 sats on 1 LPM to low 70's and HR increased to upper 120's on 2 LPM.  Nursing notified of vitals and emesis.  Pt then returned to upper 90's and 80's for HR.  O2 increased to 2 LPM.  Pt states sometimes feels elevated HR.  Pt performs seated LE there ex including LAQ, hip flexion, abd/add 3 x 10-15 w/ extended rest breaks for O2 sats.  Pt performed 3 x 5 w/c push-ups w/ verbal cues for forward lean and foot placement.  Pt required seated rest breaks w/ O2 sats decreasing as above.  Pt remained sitting in w/c w/ chair alarm on and all needs in reach.  Second session:  Pt presents sitting in w/c and agreeable to therapy.  Pt more animated than AM session, initiating conversation w/ PT.  Pt performed lateral scoot transfer w/c > bed w/ mod A.  Pt then performed multiple sit to stand transfers w/ mod A from lowest level of bed height.  Pt able to "hop" forward and back x 2' x 2 trials w/ mod A, w/ good clearance of R foot w/ smooth landing, educated on protecting R foot as well (Charcot foot).  Pt only able to stand x 2" on last trial.  Pt required CGA for sit to supine transfer.  Pt able to bridge for blue air pad placement .  Bed alarm on and all needs in reach.  O2 sats on 1 LPM 96%, NT performance.  Updated nurse on improvement in affect as well as  mobility.     Therapy Documentation Precautions:  Precautions Precautions: Fall Precaution Comments: wound vac Restrictions Weight Bearing Restrictions: Yes LLE Weight Bearing: Non weight bearing General:   Vital Signs:  Pain:2/10 to incision w/ meds given. Pain Assessment Pain Scale: 0-10 Pain Score: 2  Pain Type: Surgical pain Pain Location: Leg Pain Orientation: Left Pain Descriptors / Indicators: Discomfort Pain Intervention(s): Medication (See eMAR) Mobility:   Locomotion :    Trunk/Postural Assessment :    Balance:   Exercises:   Other Treatments:      Therapy/Group: Individual Therapy  Ladoris Gene 08/06/2021, 10:15 AM

## 2021-08-06 NOTE — Progress Notes (Signed)
Occupational Therapy Session Note  Patient Details  Name: Maxwell Harmon MRN: [id number] Date of Birth: 05-18-1956  Today's Date: 08/06/2021 OT Individual Time: 7867-6720 and 1101-1158 OT Individual Time Calculation (min): 30 min and 57 min   Short Term Goals: Week 1:  OT Short Term Goal 1 (Week 1): Pt will perform LB dressing with MIN A OT Short Term Goal 2 (Week 1): Pt will complete MIN A stand > pivot transfer with LRAD to [facility] OT Short Term Goal 3 (Week 1): Pt will tolerate activity for 5 minutes w/o requiring external O2 support  Skilled Therapeutic Interventions/Progress Updates:    1) Treatment session with focus on LB bathing and dressing.  Pt received semi-reclined in bed with RN providing morning meds and pain meds.  Pt agreeable to self-care tasks this AM.  Engaged in LB bathing at bed level with pt requiring assistance to wash buttocks.  Pt required mod assist to come to sitting EOB this session due to reports of fatigue and weakness.  Pt attempted LB dressing from EOB requiring assistance to thread RLE after threading LLE.  Attempted sit > stand from elevated EOB with RW with pt unable to come to full upright standing despite max-total assist from therapist.  Obtained +2 assist and pt then able to stand with +2 and assistance to pull pants over hips while pt maintained standing with min +2.  Pt attempted lateral scoot/squat pivot transfer towards R requiring max assist and stabilization for w/c due to sliding.  Therapist obtained slide board for transfer as pt with decreased anterior weight shift and pushing on w/c during transfer causing it to slide.  Pt passed off to PT and end of session.  O2 sats dropping to 82% on room air.  Reapplied 2L via nasal canula.    2) Treatment session with focus on UB bathing/dressing, sit > stand, and BUE strengthening/endurance.  Pt received upright in w/c reporting nausea during PT session.  Pt agreeable to completing UB bathing/dressing this  session and grooming tasks at sink.  Pt positioned w/c at sink with min assist to improve positioning.  Pt completed UB bathing/dressing with supervision/setup for items.  Pt with difficulty opening toothpaste.  Pt completed w/c pushups x5 in preparation for sit > stand.  Engaged in sit > stand x5 with min-mod assist depending on positioning of RLE.  Pt tolerating standing ~20 seconds before requiring rest break.  Therapist educated on improved positioning in w/c prior to standing and anterior weight shift.  Engaged in [place] with level 3 theraband with 2 sets of 10 chest pulls and shoulder flexion.  Pt requiring modified technique on L due to frozen shoulder.  Pt remained upright in w/c with seat belt alarm on and all needs in reach.  Therapy Documentation Precautions:  Precautions Precautions: Fall Precaution Comments: wound vac Restrictions Weight Bearing Restrictions: Yes LLE Weight Bearing: Non weight bearing General:   Vital Signs: Therapy Vitals Temp: (!) 97.4 F (36.3 C) Temp Source: Oral Pulse Rate: 80 Resp: 18 BP: 115/63 Patient Position (if appropriate): Lying Pain: Pain Assessment Pain Scale: 0-10 Pain Score: 2  Pain Type: Surgical pain Pain Location: Leg Pain Orientation: Left Pain Descriptors / Indicators: Discomfort Pain Intervention(s): Medication (See eMAR)   Therapy/Group: Individual Therapy  Simonne Come 08/06/2021, 9:21 AM

## 2021-08-07 LAB — GLUCOSE, CAPILLARY
Glucose-Capillary: 79 mg/dL (ref 70–99)
Glucose-Capillary: 125 mg/dL — ABNORMAL HIGH (ref 70–99)
Glucose-Capillary: 95 mg/dL (ref 70–99)
Glucose-Capillary: 98 mg/dL (ref 70–99)
Glucose-Capillary: 76 mg/dL (ref 70–99)
Glucose-Capillary: 102 mg/dL — ABNORMAL HIGH (ref 70–99)
Glucose-Capillary: 109 mg/dL — ABNORMAL HIGH (ref 70–99)
Glucose-Capillary: 131 mg/dL — ABNORMAL HIGH (ref 70–99)
Glucose-Capillary: 96 mg/dL (ref 70–99)
Glucose-Capillary: 101 mg/dL — ABNORMAL HIGH (ref 70–99)

## 2021-08-07 LAB — RENAL FUNCTION PANEL
Albumin: 2.6 g/dL — ABNORMAL LOW (ref 3.5–5.0)
Anion gap: 10 (ref 5–15)
BUN: 73 mg/dL — ABNORMAL HIGH (ref 8–23)
CO2: 27 mmol/L (ref 22–32)
Calcium: 8.6 mg/dL — ABNORMAL LOW (ref 8.9–10.3)
Chloride: 91 mmol/L — ABNORMAL LOW (ref 98–111)
Creatinine, Ser: 6.96 mg/dL — ABNORMAL HIGH (ref 0.61–1.24)
GFR, Estimated: 8 mL/min — ABNORMAL LOW (ref >60)
Glucose, Bld: 150 mg/dL — ABNORMAL HIGH (ref 70–99)
Phosphorus: 4.8 mg/dL — ABNORMAL HIGH (ref 2.5–4.6)
Potassium: 4.4 mmol/L (ref 3.5–5.1)
Sodium: 128 mmol/L — ABNORMAL LOW (ref 135–145)

## 2021-08-07 LAB — CBC
HCT: 30.3 % — ABNORMAL LOW (ref 39.0–52.0)
Hemoglobin: 9 g/dL — ABNORMAL LOW (ref 13.0–17.0)
MCH: 31.1 pg (ref 26.0–34.0)
MCHC: 29.7 g/dL — ABNORMAL LOW (ref 30.0–36.0)
MCV: 104.8 fL — ABNORMAL HIGH (ref 80.0–100.0)
Platelets: 152 10*3/uL (ref 150–400)
RBC: 2.89 MIL/uL — ABNORMAL LOW (ref 4.22–5.81)
RDW: 15.9 % — ABNORMAL HIGH (ref 11.5–15.5)
WBC: 6.7 10*3/uL (ref 4.0–10.5)
nRBC: 0.3 % — ABNORMAL HIGH (ref 0.0–0.2)

## 2021-08-07 LAB — AMMONIA: Ammonia: 15 umol/L (ref 9–35)

## 2021-08-07 MED ORDER — SORBITOL 70 % SOLN
60.0000 mL | Freq: Every day | Status: DC | PRN
  Administered 2021-08-07: 60 mL via ORAL
  Filled 2021-08-07: qty 60

## 2021-08-07 MED ORDER — ONDANSETRON HCL 4 MG/2ML IJ SOLN
4.0000 mg | Freq: Three times a day (TID) | INTRAMUSCULAR | Status: DC | PRN
  Administered 2021-08-07 – 2021-08-08 (×2): 4 mg via INTRAVENOUS
  Filled 2021-08-07 (×4): qty 2

## 2021-08-07 MED ORDER — HEPARIN SODIUM (PORCINE) 1000 UNIT/ML DIALYSIS
40.0000 [IU]/kg | INTRAMUSCULAR | Status: DC | PRN
  Administered 2021-08-07: 3300 [IU] via INTRAVENOUS_CENTRAL
  Filled 2021-08-07 (×2): qty 4

## 2021-08-07 NOTE — Progress Notes (Signed)
PROGRESS NOTE   Subjective/Complaints: Feeling nauseous this morning. Does not have any PRN nausea meds ordered. Feeling nauseous this weekend as well and has been limiting therapy sessions. Add IV Zofran q8H prn. Stop zinc. Changed to 15/7. Has HD today. Discussed with therapy.   ROS: denies pain, +nausea   Objective:   No results found. Recent Labs    08/04/21 1208  WBC 5.3  HGB 8.9*  HCT 29.5*  PLT 152   Recent Labs    08/04/21 1208  NA 131*  K 3.9  CL 93*  CO2 27  GLUCOSE 53*  BUN 62*  CREATININE 5.69*  CALCIUM 8.6*    Intake/Output Summary (Last 24 hours) at 08/07/2021 1028 Last data filed at 08/07/2021 0815 Gross per 24 hour  Intake 715 ml  Output 0 ml  Net 715 ml     Pressure Injury 06/16/21 Sacrum Mid Stage 1 -  Intact skin with non-blanchable redness of a localized area usually over a bony prominence. (Active)  06/16/21 1310  Location: Sacrum  Location Orientation: Mid  Staging: Stage 1 -  Intact skin with non-blanchable redness of a localized area usually over a bony prominence.  Wound Description (Comments):   Present on Admission: Yes    Physical Exam: Vital Signs Blood pressure 110/67, pulse 86, temperature 97.8 F (36.6 C), temperature source Oral, resp. rate 16, height 5\' 11"  (1.803 m), weight 86 kg, SpO2 99 %. Gen: no distress, normal appearing HEENT: oral mucosa pink and moist, NCAT Cardio: Reg rate Chest: normal effort, normal rate of breathing Abd: soft, non-distended, +nausea Ext: no edema Psych: pleasant, normal affect  Musculoskeletal:        General: Normal range of motion.     Cervical back: Normal range of motion.     Comments: Left knuckles with multiple scabs. Multiple bruises on BUE. L-BKA with compressive stocking and wound VAC in place.  Pes planus deformity right foot without skin breakdown.   Skin:    Coloration: Skin is pale.  Neurological:     Mental Status: He  is alert and oriented to person, place, and time.     Comments: Normal language, no focal cn findings. UE grossly 4+/5. RLE 3+ prox to 4+/5 distally, LLE 2/5 proximally and limited by pain. Decreased sensation to PP and LT right foot.  Psychiatric:     Comments: A little flat but generally cooperative      Assessment/Plan: 1. Functional deficits which require 3+ hours per day of interdisciplinary therapy in a comprehensive inpatient rehab setting. Physiatrist is providing close team supervision and 24 hour management of active medical problems listed below. Physiatrist and rehab team continue to assess barriers to discharge/monitor patient progress toward functional and medical goals  Care Tool:  Bathing    Body parts bathed by patient: Front perineal area, Right upper leg, Left upper leg   Body parts bathed by helper: Buttocks     Bathing assist Assist Level: Minimal Assistance - Patient > 75%     Upper Body Dressing/Undressing Upper body dressing   What is the patient wearing?: Pull over shirt    Upper body assist Assist Level: Minimal Assistance - Patient >  75%    Lower Body Dressing/Undressing Lower body dressing      What is the patient wearing?: Pants     Lower body assist Assist for lower body dressing: 2 Helpers (at sit > stand level)     Toileting Toileting    Toileting assist Assist for toileting: Contact Guard/Touching assist     Transfers Chair/bed transfer  Transfers assist     Chair/bed transfer assist level: Moderate Assistance - Patient 50 - 74%     Locomotion Ambulation   Ambulation assist   Ambulation activity did not occur: Safety/medical concerns (weakness, decreased balance, fear of falling)  Assist level: Moderate Assistance - Patient 50 - 74% Assistive device: Walker-rolling Max distance: 2'   Walk 10 feet activity   Assist  Walk 10 feet activity did not occur: Safety/medical concerns (weakness, decreased balance, fear of  falling)        Walk 50 feet activity   Assist Walk 50 feet with 2 turns activity did not occur: Safety/medical concerns (weakness, decreased balance, fear of falling)         Walk 150 feet activity   Assist Walk 150 feet activity did not occur: Safety/medical concerns (weakness, decreased balance, fear of falling)         Walk 10 feet on uneven surface  activity   Assist Walk 10 feet on uneven surfaces activity did not occur: Safety/medical concerns (weakness, decreased balance, fear of falling)         Wheelchair     Assist Is the patient using a wheelchair?: Yes Type of Wheelchair: Manual    Wheelchair assist level: Supervision/Verbal cueing Max wheelchair distance: 54ft    Wheelchair 50 feet with 2 turns activity    Assist        Assist Level: Supervision/Verbal cueing   Wheelchair 150 feet activity     Assist      Assist Level: Dependent - Patient 0%   Blood pressure 110/67, pulse 86, temperature 97.8 F (36.6 C), temperature source Oral, resp. rate 16, height 5\' 11"  (1.803 m), weight 86 kg, SpO2 99 %.  Medical Problem List and Plan: 1.  Functional and mobility deficits secondary to PAD and left BKA/BKA revision 07/27/21              -patient may not yet shower             -ELOS/Goals: 12-14 days, min assist goals at w/c level  -Continue CIRPT, OT, change to 15/7 2.  Impaired mobility -DVT/anticoagulation:  Pharmaceutical: Heparin added             -antiplatelet therapy: Continue ASA/Plavix 3. Pain: Decrease Diluadid to 0.5mg  q4H prn. Oxycodone prn.               --Schedule gabapentin daily at HS (instead of prn) 4. Mood: LCSW to follow for evaluation and support.              -antipsychotic agents: N/A 5. Neuropsych: This patient is capable of making decisions on his own behalf. 6. Skin/Wound Care: Continue wound VAC for couple more days per ortho              --continue Vitamin C and Zinc with juven bid to promote wound  healing.  7. Fluids/Electrolytes/Nutrition: Strict I/O. Routine check of labs with HD. 8. T2DM: Monitor BS ac/hs and use SSI for elevated BS             --Continue insulin Glargine with 2 units  Novolog TID CBG (last 3)  Recent Labs    08/06/21 1655 08/06/21 2052 08/07/21 0611  GLUCAP 113* 158* 109*  Controlled, 10/3- continue above regimen.   9. ESRD: HD MWF at the end of the day to help with tolerance of therapy.              --on Sensipar and ferric citrate for metabolic bone disease.  10. Anemia of chronic disease: Started on weekly [name] 09/28.              --routine H/H check with HD.  11. Hypotension: Monitor on Midodrine BID.  12. Depression with acute reaction?: On Effexor with trazodone to help with sleep.              --Klonopin prn for anxiety.  13. CAD: On ASA/Plavix and Lipitor. Monitor for symptoms with increase in activity.  14.  PAD: Continue Trental TID.  15.  Constipation: continue stool softeners.  16. Nausea: IV zofran q8H prn ordered. D/c Zinc. Check labs with HD today      LOS: 4 days A FACE TO FACE EVALUATION WAS PERFORMED  Sasan Wilkie P Kerie Badger 08/07/2021, 10:28 AM

## 2021-08-07 NOTE — Progress Notes (Signed)
   08/07/21 1737  Vitals  Temp 97.9 F (36.6 C)  Temp Source Oral  BP 115/88  BP Location Right Arm  BP Method Automatic  Patient Position (if appropriate) Lying  Pulse Rate 76  Pulse Rate Source Monitor  Resp 17  Oxygen Therapy  SpO2 98 %  O2 Device Nasal Cannula  O2 Flow Rate (L/min) 2 L/min  Pain Assessment  Pain Scale 0-10  Pain Score 0  Dialysis Weight  Weight 81 kg  Type of Weight Post-Dialysis  Post-Hemodialysis Assessment  Rinseback Volume (mL) 250 mL  KECN 289 V  Dialyzer Clearance Lightly streaked  Duration of HD Treatment -hour(s) 3.5 hour(s)  Hemodialysis Intake (mL) 500 mL  UF Total -Machine (mL) 2712 mL  Net UF (mL) 2212 mL  Tolerated HD Treatment No (Comment)  Post-Hemodialysis Comments tx complete-uf goal not met due to bp  AVG/AVF Arterial Site Held (minutes) 10 minutes  AVG/AVF Venous Site Held (minutes) 10 minutes  HD tx complete, pt stable. Pt noted with SBP in 80s, asymptomatic. Brooke Bonito on unit and made aware, received instruction to pull fluid as tolerated and keep SBP >90. Scheduled 1700 midodrine given as well. UF goal not met.

## 2021-08-07 NOTE — Progress Notes (Signed)
Patient ID: Maxwell Harmon, male   DOB: December 20, 1955, 65 y.o.   MRN: [id number]  [facility] Progress Note   Assessment/ Plan:   1.  Status post revision of left below-knee amputation: With intermittent breakthrough pain ongoing management as directed by orthopedic surgery.  Admitted to inpatient rehabilitation unit. 2. ESRD: on HD MWF. HD today. Pt w/ asterixis, doubt uremia w/ B/Cr 62/ 5.69, but not on gabapentin and not getting a lot of narcotics at this point. Not sure cause, check NH3. 3. Anemia: Hemoglobin levels low, resumed [name] on Wednesday this past week.. 4. CKD-MBD: Calcium level acceptable with improving phosphorus levels on Auryxia. 5. Nutrition: Ongoing renal diet with protein supplementation and renal multivitamin.  Fluid restriction. 6. Hypertension: With episodes of intradialytic hypotension-on midodrine 10 mg twice daily.   Kelly Splinter, MD 08/07/2021, 1:13 PM    Subjective:   C/o nausea and also arms started to jerk yesterday   Objective:   BP 110/67   Pulse 86   Temp 97.8 F (36.6 C) (Oral)   Resp 16   Ht 5\' 11"  (1.803 m)   Wt 86 kg   SpO2 99%   BMI 26.44 kg/m   Physical Exam: Gen: Appears uncomfortable sitting up in wheelchair CVS: Pulse regular rhythm, normal rate, S1 and S2 normal Resp: Clear to auscultation bilaterally, no rales/rhonchi Abd: Soft, obese, nontender, bowel sounds normal Ext: Left BKA with shrinker, right leg trace-1+ edema.  Left upper arm AV fistula with positive thrill Neuro: +asterixis, mild-mod  Labs: BMET Recent Labs  Lab 08/02/21 0835 08/04/21 1208  NA 132* 131*  K 4.4 3.9  CL 93* 93*  CO2 27 27  GLUCOSE 74 53*  BUN 68* 62*  CREATININE 5.80* 5.69*  CALCIUM 8.6* 8.6*  PHOS 6.2* 4.5    CBC Recent Labs  Lab 08/02/21 0836 08/04/21 1208  WBC 6.5 5.3  HGB 8.3* 8.9*  HCT 27.6* 29.5*  MCV 103.8* 102.4*  PLT 139* 152       Medications:     vitamin C  1,000 mg Oral Daily   aspirin EC  81 mg  Oral Daily   atorvastatin  40 mg Oral Daily   cinacalcet  30 mg Oral Weekly   clopidogrel  75 mg Oral Daily   [START ON 08/09/2021] darbepoetin ([name]) injection - DIALYSIS  200 mcg Intravenous Q Wed-HD   docusate sodium  100 mg Oral Daily   ferric citrate  420 mg Oral TID with meals   heparin  5,000 Units Subcutaneous Q8H   insulin aspart  0-6 Units Subcutaneous TID WC   insulin glargine-yfgn  5 Units Subcutaneous QHS   midodrine  10 mg Oral BID WC   multivitamin  1 tablet Oral QHS   nutrition supplement (JUVEN)  1 packet Oral BID BM   pantoprazole  40 mg Oral Daily   traZODone  50 mg Oral QHS   venlafaxine XR  37.5 mg Oral QPM

## 2021-08-07 NOTE — Progress Notes (Signed)
Occupational Therapy Session Note  Patient Details  Name: Maxwell Harmon MRN: [id number] Date of Birth: 1956-07-05  Today's Date: 08/07/2021 OT Individual Time: 3329-5188 and 4166-0630 OT Individual Time Calculation (min): 23 min and 10 min and Today's Date: 08/07/2021 OT Missed Time: 52 Minutesand 50 min Missed Time Reason: Patient fatigue;Other (comment) (pt requesting to eat breakfast and not feeling well)   Short Term Goals: Week 1:  OT Short Term Goal 1 (Week 1): Pt will perform LB dressing with MIN A OT Short Term Goal 2 (Week 1): Pt will complete MIN A stand > pivot transfer with LRAD to [facility] OT Short Term Goal 3 (Week 1): Pt will tolerate activity for 5 minutes w/o requiring external O2 support  Skilled Therapeutic Interventions/Progress Updates:    1) Treatment session limited by increased tremors and pt with reports of not feeling well.  Pt awake upon arrival reporting not feeling well this AM, noting increased tremors.  While discussing events of yesterday and overnight, pt began to state nonsensical statements and requesting to see RN.  Therapist notified RN of what pt was stating and somewhat slowed nature of verbalizations.  All vitals WNL.  Therapist, RN, and pt agreed to have pt attempt to eat, take morning meds, and rest with therapist to return at a later time.  Also of note, pt due for HD today.  2) Limited treatment session secondary to nausea and not feeling well. Pt with emesis bag on lap and reporting not feeling well.  Pt reports pain in buttocks and requested to be repositioned.  Therapist lowered bed from chair position to more reclined and had pt roll R and L to add pt's padded cushion under buttocks for increased pressure relief per pt request.  Pt remained semi-reclined in bed with all needs in reach.  Per MD, plan to start IV meds for nausea.  Therapy Documentation Precautions:  Precautions Precautions: Fall Precaution Comments: wound vac Restrictions Weight  Bearing Restrictions: Yes LLE Weight Bearing: Non weight bearing General: General OT Amount of Missed Time: 52 Minutes +50 minutes Vital Signs: Therapy Vitals Temp: 97.8 F (36.6 C) Temp Source: Oral Pulse Rate: 86 Resp: 16 BP: 110/67 Oxygen Therapy SpO2: 99 % Pain: Pain Assessment Pain Scale: 0-10 Pain Score: 0-No pain    Therapy/Group: Individual Therapy  Simonne Come 08/07/2021, 8:15 AM

## 2021-08-07 NOTE — Progress Notes (Signed)
Occupational Therapy Note  Patient Details  Name: Maxwell Harmon MRN: [id number] Date of Birth: [birth year]   Pt's plan of care adjusted to 15/7 after speaking with care team and discussed with MD as pt currently unable to tolerate current therapy schedule with OT, PT, and HD.     Simonne Come 08/07/2021, 10:48 AM

## 2021-08-07 NOTE — Progress Notes (Signed)
Physical Therapy Session Note  Patient Details  Name: Maxwell Harmon MRN: [id number] Date of Birth: 1956-04-25  Today's Date: 08/07/2021 PT Individual Time: 0917-0927 PT Individual Time Calculation (min): 10 min   Today's Date: 08/07/2021 PT Missed Time: 50 Minutes Missed Time Reason: Patient ill (Comment) (nausea)  Short Term Goals: Week 1:  PT Short Term Goal 1 (Week 1): pt will transfer sit<>stand with LRAD and min A PT Short Term Goal 2 (Week 1): pt will transfer stand<>pivot with LRAD and min A PT Short Term Goal 3 (Week 1): pt will perform simulated car transfer with CGA  Skilled Therapeutic Interventions/Progress Updates:   Received pt sitting upright in bed asleep with emesis bag in hand. Upon wakening pt reported feeling sick all morning and with active emesis episodes. Pt also demonstrated difficulty sustaining arousal, falling back asleep mid-conversation. Notified NT and RN and per RN, pt's vitals stable and being monitored but reported that pt was unable to participate in morning OT session. Provided pt with cool washcloth, ginger ale, and replaced full emesis bag in pt's hand with new one. Concluded session with pt sitting upright asleep, needs within reach, and bed alarm on. 50 minutes missed of skilled physical therapy due to nausea.   Therapy Documentation Precautions:  Precautions Precautions: Fall Precaution Comments: wound vac Restrictions Weight Bearing Restrictions: Yes LLE Weight Bearing: Non weight bearing  Therapy/Group: Individual Therapy Alfonse Alpers PT, DPT    08/07/2021, 7:22 AM

## 2021-08-08 LAB — GLUCOSE, CAPILLARY
Glucose-Capillary: 84 mg/dL (ref 70–99)
Glucose-Capillary: 84 mg/dL (ref 70–99)
Glucose-Capillary: 102 mg/dL — ABNORMAL HIGH (ref 70–99)
Glucose-Capillary: 109 mg/dL — ABNORMAL HIGH (ref 70–99)

## 2021-08-08 MED ORDER — FLEET ENEMA 7-19 GM/118ML RE ENEM
1.0000 | ENEMA | Freq: Once | RECTAL | Status: AC
  Administered 2021-08-08: 1 via RECTAL
  Filled 2021-08-08: qty 1

## 2021-08-08 MED ORDER — HYDROMORPHONE HCL 1 MG/ML IJ SOLN
0.5000 mg | Freq: Four times a day (QID) | INTRAMUSCULAR | Status: DC | PRN
  Filled 2021-08-08: qty 1

## 2021-08-08 NOTE — Patient Care Conference (Signed)
Inpatient RehabilitationTeam Conference and Plan of Care Update Date: 08/08/2021   Time: 1:34 PM    Patient Name: Maxwell Harmon      Medical Record Number: [id number]  Date of Birth: [birth year] Sex: Male         Room/Bed: 4M09C/4M09C-01 Payor Info: Payor: [employer] / Plan: CIGNA MANAGED / Product Type: *No Product type* /    Admit Date/Time:  08/03/2021  3:36 PM  Primary Diagnosis:  S/P BKA (below knee amputation) unilateral, left [facility])  Hospital Problems: Principal Problem:   S/P BKA (below knee amputation) unilateral, left ([place]) Active Problems:   PAD (peripheral artery disease) [facility])    Expected Discharge Date: Expected Discharge Date: 08/19/21  Team Members Present: Physician leading conference: Dr. Leeroy Cha Social Worker Present: Erlene Quan, BSW Nurse Present: Dorthula Nettles, RN PT Present: Callie Fielding, PT OT Present: Other (comment) [facility], OT)     Current Status/Progress Goal Weekly Team Focus  Bowel/Bladder   Oliguric, constipated, LBM 9/26  resolve constipation  work on constipation   Swallow/Nutrition/ Hydration             ADL's   Supervision/setup UB bathing/dressing at sink, Mod A +2 sit > stand on 10/2 for LB dressing. Min A sit > stand when not fatigued/nauseous otherwise required up to Mod A+2 with fatigue, Min-Mod A lateral scoot vs slide board transfer depending on fatigue  Supervision overall  ADL retraining, sit > stand, dynamic standing balance, transfers, increased activity tolerance/endurance, care of residual limb   Mobility   bed mobility supervision, lateral scoot transfers mod A, sit<>stands with RW and mod A, unable to ambulate at this time  supervision overall, Mod I WC mobility, min A gait  functional mobility/transfers, generalized strengthening, dynamic standing balance/coordination, amputee education, D/C planning, and endurance.   Communication             Safety/Cognition/ Behavioral Observations             Pain   4/10 reported pain, Tylenol, Dilaudid, Oxy IR available  < 3  assess pain q 4 hr and prn   Skin   Left BKA incision with wound vac  no new breakdown  assess skin q shift and prn     Discharge Planning:  Discharging home with spouse   Team Discussion: Complains of nausea and constipation. Hypotensive. Tremors today. Oliguric, LBM 07/31/21. Sorbitol x2 not effective, will try enema. Rates pain 4/10, Tylenol, Dilaudid, and Oxy IR available. Trazodone for sleep. Left BKA incision with wound vac and no discontinue date. Education on fluid restrictions, bowel medications. Transfers mod assist. Supervision goals. Nausea is a barrier. Can be as little as min assist transfers. Supervision goals. Schedule family education. Patient on target to meet rehab goals: yes  *See Care Plan and progress notes for long and short-term goals.   Revisions to Treatment Plan:  MD adjusting bowel medications.  Teaching Needs: Family education, medication management, pain management, skin/wound care, weight bearing restrictions, transfer training, gait training, balance training, endurance training, safety awareness.   Current Barriers to Discharge: Decreased caregiver support, Medical stability, Home enviroment access/layout, Wound care, Lack of/limited family support, Hemodialysis, Weight bearing restrictions, and Medication compliance  Possible Resolutions to Barriers: Continue current pain medications, addressing constipation, continue HD, assess residual limb when wound vac discontinued, provide emotional support.     Medical Summary Current Status: nausea, fatigue, hypotension, constipation, ESRD, residual limb and neuropathic pain, hyponatremia, bilateral upper extremity tremor  Barriers to Discharge: Medical stability;Wound  care  Barriers to Discharge Comments: nausea, fatigue, hypotension, constipation, ESRD, residual limb and neuropathic pain, hyponatremia, bilateral upper extremity  tremor Possible Resolutions to [employer] Focus: continue IV zofran prn, enema today, continue HD, continue midodrine, continue to wean dilaudid, continue to check labs with HD, ammnia level checked and normal   Continued Need for Acute Rehabilitation Level of Care: The patient requires daily medical management by a physician with specialized training in physical medicine and rehabilitation for the following reasons: Direction of a multidisciplinary physical rehabilitation program to maximize functional independence : Yes Medical management of patient stability for increased activity during participation in an intensive rehabilitation regime.: Yes Analysis of laboratory values and/or radiology reports with any subsequent need for medication adjustment and/or medical intervention. : Yes   I attest that I was present, lead the team conference, and concur with the assessment and plan of the team.   Cristi Loron 08/08/2021, 2:11 PM

## 2021-08-08 NOTE — Progress Notes (Signed)
Occupational Therapy Note  Patient Details  Name: Maxwell Harmon MRN: [id number] Date of Birth: 12/22/1955  Today's Date: 08/08/2021 OT Missed Time: 77 Minutes Missed Time Reason: Patient fatigue;Patient ill (comment)  Pt missed 60 mins scheduled OT Treatment session secondary to nausea and constipation.  Pt reports feeling worse today than yesterday.  Per PT, pt attempted activity up to EOB with increased nausea.  Low BP noted.  RN to administer fleet enema due to ~7 days without BM. Will attempt to see pt later in day as schedule and pt availability allows.   Simonne Come 08/08/2021, 9:29 AM

## 2021-08-08 NOTE — Progress Notes (Signed)
Patient ID: Maxwell Harmon, male   DOB: [birth year], 65 y.o.   MRN: [id number]  [facility] Progress Note   Assessment/ Plan:   1.  Status post revision of left below-knee amputation: With intermittent breakthrough pain ongoing management as directed by orthopedic surgery.  Admitted to inpatient rehabilitation unit. 2. ESRD: on HD MWF. HD tomorrow. Asterixis is better today, NH3 was wnl. No meds implicated.  3. Anemia: Hemoglobin levels low, resumed [name] on Wednesday this past week.. 4. CKD-MBD: Calcium level acceptable with improving phosphorus levels on Auryxia. 5. Nutrition: Ongoing renal diet with protein supplementation and renal multivitamin.  Fluid restriction. 6. Hypertension/volume excess: With episodes of intradialytic hypotension-on midodrine 10 mg twice daily. Has diffuse moderated hip/ LE edema not amenable to UF due to hypotension& hypoalbuminemia.    Kelly Splinter, MD 08/08/2021, 11:31 AM    Subjective:   C/o nausea, constipation, no SOB or confusion   Objective:   BP (!) 98/55   Pulse 91   Temp 98.1 F (36.7 C) (Oral)   Resp 16   Ht 5\' 11"  (1.803 m)   Wt 81 kg   SpO2 100%   BMI 24.91 kg/m   Physical Exam: Gen: no distress, Ox 3 CVS: Pulse regular rhythm, normal rate, S1 and S2 normal Resp: Clear to auscultation bilaterally, no rales/rhonchi Abd: Soft, obese, nontender, bowel sounds normal Ext: Left BKA with shrinker, bilat 1-2+ hip/ dependent edema  LUA AVF+ bruit Neuro: nonfocal, Ox 3  Labs: BMET Recent Labs  Lab 08/02/21 0835 08/04/21 1208 08/07/21 1256  NA 132* 131* 128*  K 4.4 3.9 4.4  CL 93* 93* 91*  CO2 27 27 27   GLUCOSE 74 53* 150*  BUN 68* 62* 73*  CREATININE 5.80* 5.69* 6.96*  CALCIUM 8.6* 8.6* 8.6*  PHOS 6.2* 4.5 4.8*    CBC Recent Labs  Lab 08/02/21 0836 08/04/21 1208 08/07/21 1256  WBC 6.5 5.3 6.7  HGB 8.3* 8.9* 9.0*  HCT 27.6* 29.5* 30.3*  MCV 103.8* 102.4* 104.8*  PLT 139* 152 152       Medications:      vitamin C  1,000 mg Oral Daily   aspirin EC  81 mg Oral Daily   atorvastatin  40 mg Oral Daily   cinacalcet  30 mg Oral Weekly   clopidogrel  75 mg Oral Daily   [START ON 08/09/2021] darbepoetin ([name]) injection - DIALYSIS  200 mcg Intravenous Q Wed-HD   docusate sodium  100 mg Oral Daily   ferric citrate  420 mg Oral TID with meals   heparin  5,000 Units Subcutaneous Q8H   insulin aspart  0-6 Units Subcutaneous TID WC   insulin glargine-yfgn  5 Units Subcutaneous QHS   midodrine  10 mg Oral BID WC   multivitamin  1 tablet Oral QHS   nutrition supplement (JUVEN)  1 packet Oral BID BM   pantoprazole  40 mg Oral Daily   traZODone  50 mg Oral QHS   venlafaxine XR  37.5 mg Oral QPM

## 2021-08-08 NOTE — Progress Notes (Signed)
Patient ID: Maxwell Harmon, male   DOB: September 19, 1956, 65 y.o.   MRN: [id number] Team Conference Report to Patient/Family  Team Conference discussion was reviewed with the patient and caregiver, including goals, any changes in plan of care and target discharge date.  Patient and caregiver express understanding and are in agreement.  The patient has a target discharge date of 08/19/21.  Sw spoke with pt spouse. Provided team conference updates. Spouse reports pt is very sleepy today. Family education scheduled 10/13 1-4 PM. Spouse reports pt had troubles with constipation at home. No additional questions or concerns.  [name] 08/08/2021, 2:07 PM

## 2021-08-08 NOTE — Progress Notes (Signed)
PROGRESS NOTE   Subjective/Complaints: Continues to feel nauseous and having worsening bilateral tremors. Unable to tolerate therapy this morning. Still has not had BM with sorbitol, fleet enema ordered this AM. Hypotension limited HD yesterday  ROS: denies pain, +nausea, +bilateral hand tremor   Objective:   No results found. Recent Labs    08/07/21 1256  WBC 6.7  HGB 9.0*  HCT 30.3*  PLT 152   Recent Labs    08/07/21 1256  NA 128*  K 4.4  CL 91*  CO2 27  GLUCOSE 150*  BUN 73*  CREATININE 6.96*  CALCIUM 8.6*    Intake/Output Summary (Last 24 hours) at 08/08/2021 0933 Last data filed at 08/08/2021 2130 Gross per 24 hour  Intake 0 ml  Output 2212 ml  Net -2212 ml         Physical Exam: Vital Signs Blood pressure (!) 98/55, pulse 91, temperature 98.1 F (36.7 C), temperature source Oral, resp. rate 16, height 5\' 11"  (1.803 m), weight 81 kg, SpO2 100 %. Gen: appears nauseous, weak HEENT: oral mucosa pink and moist, NCAT Cardio: Reg rate Chest: normal effort, normal rate of breathing Abd: soft, non-distended, +nausea Ext: no edema Psych: pleasant, normal affect  Musculoskeletal:        General: Normal range of motion.     Cervical back: Normal range of motion.     Comments: Left knuckles with multiple scabs. Multiple bruises on BUE. L-BKA with compressive stocking and wound VAC in place.  Pes planus deformity right foot without skin breakdown.   Skin:    Coloration: Skin is pale.  Neurological:     Mental Status: He is alert and oriented to person, place, and time.     Comments: Normal language, no focal cn findings. UE grossly 4+/5. RLE 3+ prox to 4+/5 distally, LLE 2/5 proximally and limited by pain. Decreased sensation to PP and LT right foot.  Psychiatric:     Comments: A little flat but generally cooperative      Assessment/Plan: 1. Functional deficits which require 3+ hours per day of  interdisciplinary therapy in a comprehensive inpatient rehab setting. Physiatrist is providing close team supervision and 24 hour management of active medical problems listed below. Physiatrist and rehab team continue to assess barriers to discharge/monitor patient progress toward functional and medical goals  Care Tool:  Bathing    Body parts bathed by patient: Front perineal area, Right upper leg, Left upper leg   Body parts bathed by helper: Buttocks     Bathing assist Assist Level: Minimal Assistance - Patient > 75%     Upper Body Dressing/Undressing Upper body dressing   What is the patient wearing?: Pull over shirt    Upper body assist Assist Level: Minimal Assistance - Patient > 75%    Lower Body Dressing/Undressing Lower body dressing      What is the patient wearing?: Pants     Lower body assist Assist for lower body dressing: 2 Helpers (at sit > stand level)     Toileting Toileting    Toileting assist Assist for toileting: Contact Guard/Touching assist     Transfers Chair/bed transfer  Transfers assist  Chair/bed transfer assist level: Moderate Assistance - Patient 50 - 74%     Locomotion Ambulation   Ambulation assist   Ambulation activity did not occur: Safety/medical concerns (weakness, decreased balance, fear of falling)  Assist level: Moderate Assistance - Patient 50 - 74% Assistive device: Walker-rolling Max distance: 2'   Walk 10 feet activity   Assist  Walk 10 feet activity did not occur: Safety/medical concerns (weakness, decreased balance, fear of falling)        Walk 50 feet activity   Assist Walk 50 feet with 2 turns activity did not occur: Safety/medical concerns (weakness, decreased balance, fear of falling)         Walk 150 feet activity   Assist Walk 150 feet activity did not occur: Safety/medical concerns (weakness, decreased balance, fear of falling)         Walk 10 feet on uneven surface   activity   Assist Walk 10 feet on uneven surfaces activity did not occur: Safety/medical concerns (weakness, decreased balance, fear of falling)         Wheelchair     Assist Is the patient using a wheelchair?: Yes Type of Wheelchair: Manual    Wheelchair assist level: Supervision/Verbal cueing Max wheelchair distance: 75ft    Wheelchair 50 feet with 2 turns activity    Assist        Assist Level: Supervision/Verbal cueing   Wheelchair 150 feet activity     Assist      Assist Level: Maximal Assistance - Patient 25 - 49%   Blood pressure (!) 98/55, pulse 91, temperature 98.1 F (36.7 C), temperature source Oral, resp. rate 16, height 5\' 11"  (1.803 m), weight 81 kg, SpO2 100 %.  Medical Problem List and Plan: 1.  Functional and mobility deficits secondary to PAD and left BKA/BKA revision 07/27/21              -patient may not yet shower             -ELOS/Goals: 12-14 days, min assist goals at w/c level  -Continue CIRPT, OT, change to 15/7  -Interdisciplinary Team Conference today   2.  Impaired mobility -DVT/anticoagulation:  Pharmaceutical: Heparin added             -antiplatelet therapy: Continue ASA/Plavix 3. Pain: Decrease Diluadid to 0.5mg  6H prn. Oxycodone prn.               --Schedule gabapentin daily at HS (instead of prn) 4. Mood: LCSW to follow for evaluation and support.              -antipsychotic agents: N/A 5. Neuropsych: This patient is capable of making decisions on his own behalf. 6. Skin/Wound Care: Continue wound VAC for couple more days per ortho              --continue Vitamin C and Zinc with juven bid to promote wound healing.  7. Fluids/Electrolytes/Nutrition: Strict I/O. Routine check of labs with HD. 8. T2DM: Monitor BS ac/hs and use SSI for elevated BS             --Continue insulin Glargine with 2 units Novolog TID CBG (last 3)  Recent Labs    08/07/21 1830 08/07/21 2131 08/08/21 0625  GLUCAP 96 101* 84  Controlled,  10/4- continue above regimen.   9. ESRD: HD MWF at the end of the day to help with tolerance of therapy.              --on  Sensipar and ferric citrate for metabolic bone disease.  10. Anemia of chronic disease: Started on weekly [name] 09/28.              --routine H/H check with HD.  11. Hypotension: Monitor on Midodrine BID.  12. Depression with acute reaction?: On Effexor with trazodone to help with sleep.              --Klonopin prn for anxiety.  13. CAD: On ASA/Plavix and Lipitor. Monitor for symptoms with increase in activity.  14.  PAD: Continue Trental TID.  15.  Constipation: continue stool softeners. Fleet enema today 16. Nausea: IV zofran q8H prn ordered. D/c Zinc. May be 2/2 constipation. Fleet enema today 17. Bilateral hand tremor: unknown cause- mmonia normal.       LOS: 5 days A FACE TO FACE EVALUATION WAS PERFORMED  Clide Deutscher Demitri Kucinski 08/08/2021, 9:33 AM

## 2021-08-08 NOTE — Progress Notes (Addendum)
Physical Therapy Session Note 30 min missed time due to nausea 15 min time made up w/additional time in pm session  Patient Details  Name: Maxwell Harmon MRN: [id number] Date of Birth: 03-Jan-1956  Today's Date: 08/08/2021 PT Individual Time: 2202-5427 and 0623-7628 PT Individual Time Calculation (min): 26 min and 61min   Short Term Goals: Week 1:  PT Short Term Goal 1 (Week 1): pt will transfer sit<>stand with LRAD and min A PT Short Term Goal 2 (Week 1): pt will transfer stand<>pivot with LRAD and min A PT Short Term Goal 3 (Week 1): pt will perform simulated car transfer with CGA Week 2:    Week 3:     Skilled Therapeutic Interventions/Progress Updates:   Am session Pt denies pain but c/o nausea. Agreeable to bed level therex. Performed the following: TKEs, SLRs, bridging, clamshells, hi abd/add   Supine to sit w/min assist and additional time.  Notable tremors LUE/LLUE.  Seated L knee flexion/extension.  Pt became nauseated.  Returns supine w/min assist.  Max assist to scoot in bed.  Pt handed off to Nurse in room for care.  30 min missed time due to nausea.  MD notified of R sided "tremors".   Pm session Pt resting in supine, reports feeling a bit better and agreeable to session.  Supine to sit w/min assist.  Continues w/notable temors R>L  Dons pants w/overall max assist in sitting/standing w/RW. Stood x 2 >45 sec w/RW, min assist from elevated bed, cues for upright posture.  Squat pivot transfer bed to wc w/mod assist. Wc propulsion x 69ft w/assist to manage lines, superivsion, slow cadence.   Sit to stand in parallel bars w/min assist for transition using bars.  Stands w/post tendency, downward gaze, R knee flexed approx 15-20degrees. Stood mult times, performed LLE hip abd, hip extension, R knee quadsets for TKE w/postural corrections. Pt transported back to room.  Pt left oob in wc w/alarm belt set and needs in reach    Therapy Documentation Precautions:   Precautions Precautions: Fall Precaution Comments: wound vac Restrictions Weight Bearing Restrictions: Yes LLE Weight Bearing: Non weight bearing     Therapy/Group: Individual Therapy Callie Fielding, [place] 08/08/2021, 12:16 PM

## 2021-08-09 LAB — GLUCOSE, CAPILLARY
Glucose-Capillary: 82 mg/dL (ref 70–99)
Glucose-Capillary: 92 mg/dL (ref 70–99)
Glucose-Capillary: 95 mg/dL (ref 70–99)
Glucose-Capillary: 97 mg/dL (ref 70–99)

## 2021-08-09 LAB — HEPATITIS B SURFACE ANTIBODY,QUALITATIVE: Hep B S Ab: REACTIVE — AB

## 2021-08-09 LAB — HEPATITIS B SURFACE ANTIGEN: Hepatitis B Surface Ag: NONREACTIVE

## 2021-08-09 MED ORDER — MIDODRINE HCL 5 MG PO TABS
10.0000 mg | ORAL_TABLET | Freq: Three times a day (TID) | ORAL | Status: DC
  Administered 2021-08-09 – 2021-08-24 (×45): 10 mg via ORAL
  Filled 2021-08-09 (×44): qty 2

## 2021-08-09 MED ORDER — OXYCODONE HCL 5 MG PO TABS
5.0000 mg | ORAL_TABLET | ORAL | Status: DC | PRN
  Administered 2021-08-10 – 2021-08-11 (×2): 5 mg via ORAL
  Filled 2021-08-09 (×3): qty 1

## 2021-08-09 MED ORDER — HEPARIN SODIUM (PORCINE) 1000 UNIT/ML DIALYSIS: 2500.0000 [IU] | INTRAMUSCULAR | Status: DC | PRN

## 2021-08-09 MED ORDER — MELATONIN 3 MG PO TABS
3.0000 mg | ORAL_TABLET | Freq: Every day | ORAL | Status: DC
  Administered 2021-08-09 – 2021-08-15 (×6): 3 mg via ORAL
  Filled 2021-08-09 (×6): qty 1

## 2021-08-09 NOTE — Progress Notes (Signed)
Occupational Therapy Session Note  Patient Details  Name: Maxwell Harmon MRN: [id number] Date of Birth: Apr 06, 1956  Today's Date: 08/09/2021 OT Individual Time: 9355-2174 OT Individual Time Calculation (min): 16 min    Short Term Goals: Week 1:  OT Short Term Goal 1 (Week 1): Pt will perform LB dressing with MIN A OT Short Term Goal 2 (Week 1): Pt will complete MIN A stand > pivot transfer with LRAD to [facility] OT Short Term Goal 3 (Week 1): Pt will tolerate activity for 5 minutes w/o requiring external O2 support  Skilled Therapeutic Interventions/Progress Updates:  Checked in with NT prior to session, noted that wound vac had been removed and residual limb had been wrapped. Pt received semi-reclined in bed, with pt's sister and dad present. Educated pt's family on rehab goals and pt's CLOF. Pt reported the residual limb wrapping feeling uncomfortable, therapist re-wrapped top ace bandage, improvement verbalized from pt. Encouraged pt to sit up in recliner to visit with family, with pt hesitant but agreeable. Pt required mod A for semi-supine > seated EOB mobility, then mod x2 squat pivot transfer to recliner. Pt repositioned in recliner with pillows and sacral off loading due to pressure sore. Pt left seated in recliner, with family present and all needs in reach at end of session. Pt missed 14 mins of OT today, due to decreased activity tolerance and arousal needed for therapy participation.  Therapy Documentation Precautions:  Precautions Precautions: Fall Precaution Comments: wound vac Restrictions Weight Bearing Restrictions: Yes LLE Weight Bearing: Non weight bearing  Pain: Discomfort verbalized on residual limb, with therapist re-wrapping ace bandage with pt reporting improvement.  Therapy/Group: Individual Therapy  Yaris Ferrell E Ilaria Much 08/09/2021, 3:35 PM

## 2021-08-09 NOTE — Progress Notes (Signed)
PROGRESS NOTE   Subjective/Complaints: Had a BM yesterday and no longer feels nauseous this morning.  Continues to have tremor, due for HD today Did not sleep well last night  ROS: denies pain, +nausea, +bilateral hand tremor   Objective:   No results found. Recent Labs    08/07/21 1256  WBC 6.7  HGB 9.0*  HCT 30.3*  PLT 152   Recent Labs    08/07/21 1256  NA 128*  K 4.4  CL 91*  CO2 27  GLUCOSE 150*  BUN 73*  CREATININE 6.96*  CALCIUM 8.6*    Intake/Output Summary (Last 24 hours) at 08/09/2021 1026 Last data filed at 08/09/2021 0730 Gross per 24 hour  Intake 340 ml  Output 0 ml  Net 340 ml         Physical Exam: Vital Signs Blood pressure 105/62, pulse 95, temperature 98.6 F (37 C), temperature source Oral, resp. rate 16, height 5\' 11"  (1.803 m), weight 80.5 kg, SpO2 95 %. Gen: appears nauseous, weak HEENT: oral mucosa pink and moist, NCAT Cardio: Reg rate Chest: normal effort, normal rate of breathing Abd: soft, non-distended, +nausea Ext: no edema Psych: pleasant, normal affect  Musculoskeletal:        General: Normal range of motion.     Cervical back: Normal range of motion.     Comments: Left knuckles with multiple scabs. Multiple bruises on BUE. L-BKA with compressive stocking and wound VAC in place.  Pes planus deformity right foot without skin breakdown.   Skin:    Coloration: Skin is pale.  Neurological:     Mental Status: He is alert and oriented to person, place, and time.     Comments: Normal language, no focal cn findings. UE grossly 4+/5. RLE 3+ prox to 4+/5 distally, LLE 2/5 proximally and limited by pain. Decreased sensation to PP and LT right foot. +bilateral hand tremor Psychiatric:     Comments: A little flat but generally cooperative      Assessment/Plan: 1. Functional deficits which require 3+ hours per day of interdisciplinary therapy in a comprehensive inpatient  rehab setting. Physiatrist is providing close team supervision and 24 hour management of active medical problems listed below. Physiatrist and rehab team continue to assess barriers to discharge/monitor patient progress toward functional and medical goals  Care Tool:  Bathing    Body parts bathed by patient: Front perineal area, Right upper leg, Left upper leg   Body parts bathed by helper: Buttocks     Bathing assist Assist Level: Minimal Assistance - Patient > 75%     Upper Body Dressing/Undressing Upper body dressing   What is the patient wearing?: Pull over shirt    Upper body assist Assist Level: Minimal Assistance - Patient > 75%    Lower Body Dressing/Undressing Lower body dressing      What is the patient wearing?: Pants     Lower body assist Assist for lower body dressing: 2 Helpers (at sit > stand level)     Toileting Toileting    Toileting assist Assist for toileting: Contact Guard/Touching assist     Transfers Chair/bed transfer  Transfers assist     Chair/bed transfer  assist level: Moderate Assistance - Patient 50 - 74%     Locomotion Ambulation   Ambulation assist   Ambulation activity did not occur: Safety/medical concerns (weakness, decreased balance, fear of falling)  Assist level: Moderate Assistance - Patient 50 - 74% Assistive device: Walker-rolling Max distance: 2'   Walk 10 feet activity   Assist  Walk 10 feet activity did not occur: Safety/medical concerns (weakness, decreased balance, fear of falling)        Walk 50 feet activity   Assist Walk 50 feet with 2 turns activity did not occur: Safety/medical concerns (weakness, decreased balance, fear of falling)         Walk 150 feet activity   Assist Walk 150 feet activity did not occur: Safety/medical concerns (weakness, decreased balance, fear of falling)         Walk 10 feet on uneven surface  activity   Assist Walk 10 feet on uneven surfaces activity did  not occur: Safety/medical concerns (weakness, decreased balance, fear of falling)         Wheelchair     Assist Is the patient using a wheelchair?: Yes Type of Wheelchair: Manual    Wheelchair assist level: Supervision/Verbal cueing Max wheelchair distance: 65ft    Wheelchair 50 feet with 2 turns activity    Assist        Assist Level: Supervision/Verbal cueing   Wheelchair 150 feet activity     Assist      Assist Level: Maximal Assistance - Patient 25 - 49%   Blood pressure 105/62, pulse 95, temperature 98.6 F (37 C), temperature source Oral, resp. rate 16, height 5\' 11"  (1.803 m), weight 80.5 kg, SpO2 95 %.  Medical Problem List and Plan: 1.  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  Impaired mobility -DVT/anticoagulation:  Pharmaceutical: Heparin added             -antiplatelet therapy: Continue ASA/Plavix 3. Pain: Decrease Diluadid to 0.5mg  6H prn. Oxycodone prn.               --Schedule gabapentin daily at HS (instead of prn) 4. Mood: LCSW to follow for evaluation and support.              -antipsychotic agents: N/A 5. Neuropsych: This patient is capable of making decisions on his own behalf. 6. Skin/Wound Care: Continue wound VAC for couple more days per ortho              --continue Vitamin C and Zinc with juven bid to promote wound healing.  7. Fluids/Electrolytes/Nutrition: Strict I/O. Routine check of labs with HD. 8. T2DM: Monitor BS ac/hs and use SSI for elevated BS             --Continue insulin Glargine with 2 units Novolog TID CBG (last 3)  Recent Labs    08/08/21 1700 08/08/21 2106 08/09/21 0650  GLUCAP 102* 109* 82  Controlled, 10/5- continue above regimen.   9. ESRD: HD MWF at the end of the day to help with tolerance of therapy.               --on Sensipar and ferric citrate for metabolic bone  disease.  Increase midodrine to 10mg  TID so he can tolerate more ultrafiltration 10. Anemia of chronic disease: Started on weekly [name] 09/28.              --routine H/H check with HD.  11. Hypotension: Increase midodrine to 10mg  TID so he can tolerate more ultrafiltration 12. Depression with acute reaction?: On Effexor with trazodone to help with sleep.              --Klonopin prn for anxiety.  13. CAD: On ASA/Plavix and Lipitor. Monitor for symptoms with increase in activity.  14.  PAD: Continue Trental TID.  15.  Constipation: continue stool softeners. Resolved with fleet enema 16. Nausea: Improved with resolution of constipation.  17. Bilateral hand tremor: unknown cause- mmonia normal.       LOS: 6 days A FACE TO FACE EVALUATION WAS PERFORMED  Delila Kuklinski P Cris Gibby 08/09/2021, 10:26 AM

## 2021-08-09 NOTE — Progress Notes (Signed)
   08/09/21 0945  Clinical Encounter Type  Visited With Patient not available  Visit Type Follow-up  Referral From Chaplain  Consult/Referral To Chaplain   Chaplain attempted follow up visit. Patient sitting on the side of the bed in session with PT.Chaplain remains available for follow up spiritual /emotional support as needed. This note was prepared by Jeanine Luz, M.Div..  For questions please contact by phone [phone].

## 2021-08-09 NOTE — Progress Notes (Signed)
Physical Therapy Session Note  Patient Details  Name: Maxwell Harmon MRN: [id number] Date of Birth: [birth year]  Today's Date: 08/09/2021 PT Individual Time: 0800-0828 PT Individual Time Calculation (min): 28 min  Today's Date: 08/09/2021 PT Missed Time: 32 Minutes Missed Time Reason: Patient fatigue  Short Term Goals: Week 1:  PT Short Term Goal 1 (Week 1): pt will transfer sit<>stand with LRAD and min A PT Short Term Goal 2 (Week 1): pt will transfer stand<>pivot with LRAD and min A PT Short Term Goal 3 (Week 1): pt will perform simulated car transfer with CGA  Skilled Therapeutic Interventions/Progress Updates:   Received pt semi-reclined in bed asleep, upon wakening pt reported feeling "rough" from not sleeping last night 2/2 pain. Pt appeared confused and extremely lethargic throughout session and reported feeling SOB. Vitals taken: BP 115/71 HR 92 bpm and O2 sat 91% on 2L O2 via [place]; encouraged pursed lip breathing and O2 sat increased to 93%. Pt agreed to attempt bed level exercises and performed the following exercises semi-reclined in bed with emphasis on LE strength and ROM: -SLR x10 on LLE -marching on RLE x10 AAROM  -hip adduction pillow squeezes Of note, pt with numerous tremors during session LE>UE and required maximal verbal stimuli to maintain arousal as pt only able to open eyes for ~10 seconds at a time. Dietary present to take lunch order however pt unable to sustain arousal to complete meal order, therefore therapist assisted. Concluded session with pt semi-reclined in bed asleep, needs within reach, and bed alarm on. Treatment team notified of pt's current status. 32 minutes missed of skilled physical therapy due to fatigue.   Therapy Documentation Precautions:  Precautions Precautions: Fall Precaution Comments: wound vac Restrictions Weight Bearing Restrictions: Yes LLE Weight Bearing: Non weight bearing  Therapy/Group: Individual Therapy Alfonse Alpers PT, DPT   08/09/2021, 7:32 AM

## 2021-08-09 NOTE — Progress Notes (Addendum)
Occupational Therapy Session Note  Patient Details  Name: Maxwell Harmon MRN: [id number] Date of Birth: 1956/03/18  Today's Date: 08/09/2021 OT Individual Time: 0930-1006 OT Individual Time Calculation (min): 36 min    Short Term Goals: Week 1:  OT Short Term Goal 1 (Week 1): Pt will perform LB dressing with MIN A OT Short Term Goal 2 (Week 1): Pt will complete MIN A stand > pivot transfer with LRAD to [facility] OT Short Term Goal 3 (Week 1): Pt will tolerate activity for 5 minutes w/o requiring external O2 support  Skilled Therapeutic Interventions/Progress Updates:  Received pt semi-reclined in bed asleep, with decreased response to auditory stimuli from therapist. Pt reported not sleeping "at all" last night, due to the tremors or "jumping of the arm." Vitals taken: BP 113/67 O2 sat 95%. Encouraged pt to sit EOB for simple UB bathing/dressing task, with pt agreeable. Transitioned to seated EOB with mod A, with pt demonstrating tremors throughout that affected trunk control. Pt required mod assist for supported sitting, with loss of balance demonstrated several times in static sitting when R hand was holding on to bed rail vs lying flat on bed. Pt bathed UB with min A and donned tshirt with mod A. Pt opted to stay in bed, due to not feeling well and demonstrating in and out alertness and decreased response throughout ADL session. Pt required mod A for EOB > supine in bed. Noted that pt's wound vac was turned off, checked in with NT, with care team being unaware of it being off and for how long. Positioned pt with pillows, and notified NT and PA about pt's current status, wound vac and recommendation for different mattress to promote increased comfort for pt's sleep. Bed alarm activated, call bell and all other needs in reach at conclusion of session. Pt missed 24 mins of OT, due to pt's fatigue.  Therapy Documentation Precautions:  Precautions Precautions: Fall Precaution Comments: wound  vac Restrictions Weight Bearing Restrictions: Yes LLE Weight Bearing: Non weight bearing  Pain: Pt with no complaints of pain.  Therapy/Group: Individual Therapy  Apphia Cropley E Kimisha Eunice 08/09/2021, 3:13 PM

## 2021-08-09 NOTE — Progress Notes (Signed)
Wound vac removed per PA, pt tolerated well. sutures are clean, and intact. Noted some bleeding around sutures. BKA wrapped with 4x4 gauze, ABD pad, and ace wrap.   Dayna Ramus

## 2021-08-10 DIAGNOSIS — R278 Other lack of coordination: Secondary | ICD-10-CM | POA: Diagnosis not present

## 2021-08-10 DIAGNOSIS — Z89512 Acquired absence of left leg below knee: Secondary | ICD-10-CM | POA: Diagnosis not present

## 2021-08-10 DIAGNOSIS — N186 End stage renal disease: Secondary | ICD-10-CM | POA: Diagnosis not present

## 2021-08-10 DIAGNOSIS — K5900 Constipation, unspecified: Secondary | ICD-10-CM

## 2021-08-10 DIAGNOSIS — I739 Peripheral vascular disease, unspecified: Secondary | ICD-10-CM | POA: Diagnosis not present

## 2021-08-10 DIAGNOSIS — Z7189 Other specified counseling: Secondary | ICD-10-CM

## 2021-08-10 DIAGNOSIS — Z515 Encounter for palliative care: Secondary | ICD-10-CM | POA: Diagnosis not present

## 2021-08-10 DIAGNOSIS — I959 Hypotension, unspecified: Secondary | ICD-10-CM

## 2021-08-10 LAB — BASIC METABOLIC PANEL
Anion gap: 10 (ref 5–15)
BUN: 31 mg/dL — ABNORMAL HIGH (ref 8–23)
CO2: 29 mmol/L (ref 22–32)
Calcium: 9 mg/dL (ref 8.9–10.3)
Chloride: 95 mmol/L — ABNORMAL LOW (ref 98–111)
Creatinine, Ser: 3.97 mg/dL — ABNORMAL HIGH (ref 0.61–1.24)
GFR, Estimated: 16 mL/min — ABNORMAL LOW (ref >60)
Glucose, Bld: 94 mg/dL (ref 70–99)
Potassium: 3.8 mmol/L (ref 3.5–5.1)
Sodium: 134 mmol/L — ABNORMAL LOW (ref 135–145)

## 2021-08-10 LAB — GLUCOSE, CAPILLARY
Glucose-Capillary: 79 mg/dL (ref 70–99)
Glucose-Capillary: 87 mg/dL (ref 70–99)
Glucose-Capillary: 126 mg/dL — ABNORMAL HIGH (ref 70–99)
Glucose-Capillary: 129 mg/dL — ABNORMAL HIGH (ref 70–99)

## 2021-08-10 MED ORDER — DOCUSATE SODIUM 100 MG PO CAPS
200.0000 mg | ORAL_CAPSULE | Freq: Every day | ORAL | Status: DC
  Administered 2021-08-11 – 2021-08-18 (×8): 200 mg via ORAL
  Filled 2021-08-10 (×8): qty 2

## 2021-08-10 MED ORDER — LACTULOSE 10 GM/15ML PO SOLN
20.0000 g | Freq: Once | ORAL | Status: AC
  Administered 2021-08-10: 20 g via ORAL
  Filled 2021-08-10: qty 30

## 2021-08-10 MED ORDER — TRAZODONE HCL 50 MG PO TABS
50.0000 mg | ORAL_TABLET | Freq: Every day | ORAL | Status: DC
  Administered 2021-08-10: 50 mg via ORAL
  Filled 2021-08-10: qty 1

## 2021-08-10 MED ORDER — SENNA 8.6 MG PO TABS
2.0000 | ORAL_TABLET | Freq: Every day | ORAL | Status: DC
  Administered 2021-08-10 – 2021-08-19 (×10): 17.2 mg via ORAL
  Filled 2021-08-10 (×9): qty 2

## 2021-08-10 NOTE — Progress Notes (Signed)
Patient ID: Maxwell Harmon, male   DOB: Feb 02, 1956, 65 y.o.   MRN: [id number]  [facility] Progress Note   Assessment/ Plan:   1.  Status post revision of left below-knee amputation: With intermittent breakthrough pain ongoing management as directed by orthopedic surgery.  Admitted to inpatient rehabilitation unit. 2. ESRD: on HD MWF. HD Friday.  3. Anemia: Hemoglobin levels low, resumed [name] on Wednesday this past week.. 4. CKD-MBD: Calcium level acceptable with improving phosphorus levels on A[place]xia. 5. Nutrition: Ongoing renal diet with protein supplementation and renal multivitamin.  Fluid restriction. 6. Hypertension/volume excess: With episodes of intradialytic hypotension-on midodrine 10 mg twice daily.  7. Asterixis - not sure cause, B/Cr are low and NH3 is wnl. Try to avoid gabapentin/ lyrica and narcotics as these can cause asterixis in ESRD pts.    Kelly Splinter, MD 08/10/2021, 12:51 PM    Subjective:   No c/o today   Objective:   BP 121/69 (BP Location: Right Arm)   Pulse 86   Temp 98.4 F (36.9 C)   Resp 18   Ht 5\' 11"  (1.803 m)   Wt 81 kg   SpO2 97%   BMI 24.91 kg/m   Physical Exam: Gen: no distress, Ox 3 CVS: Pulse regular rhythm, normal rate, S1 and S2 normal Resp: Clear to auscultation bilaterally, no rales/rhonchi Abd: Soft, obese, nontender, bowel sounds normal Ext: Left BKA with shrinker, bilat 1+ hip/ dependent edema  LUA AVF+ bruit Neuro: nonfocal, Ox 3, mild asterixis  Labs: BMET Recent Labs  Lab 08/04/21 1208 08/07/21 1256 08/10/21 0937  NA 131* 128* 134*  K 3.9 4.4 3.8  CL 93* 91* 95*  CO2 27 27 29   GLUCOSE 53* 150* 94  BUN 62* 73* 31*  CREATININE 5.69* 6.96* 3.97*  CALCIUM 8.6* 8.6* 9.0  PHOS 4.5 4.8*  --     CBC Recent Labs  Lab 08/04/21 1208 08/07/21 1256  WBC 5.3 6.7  HGB 8.9* 9.0*  HCT 29.5* 30.3*  MCV 102.4* 104.8*  PLT 152 152       Medications:     vitamin C  1,000 mg Oral Daily   aspirin EC   81 mg Oral Daily   atorvastatin  40 mg Oral Daily   cinacalcet  30 mg Oral Weekly   clopidogrel  75 mg Oral Daily   darbepoetin ([name]) injection - DIALYSIS  200 mcg Intravenous Q Wed-HD   [START ON 08/11/2021] docusate sodium  200 mg Oral Daily   ferric citrate  420 mg Oral TID with meals   heparin  5,000 Units Subcutaneous Q8H   insulin aspart  0-6 Units Subcutaneous TID WC   insulin glargine-yfgn  5 Units Subcutaneous QHS   melatonin  3 mg Oral QHS   midodrine  10 mg Oral TID WC   multivitamin  1 tablet Oral QHS   nutrition supplement (JUVEN)  1 packet Oral BID BM   pantoprazole  40 mg Oral Daily   traZODone  50 mg Oral QHS   venlafaxine XR  37.5 mg Oral QPM

## 2021-08-10 NOTE — Progress Notes (Signed)
Physical Therapy Session Note  Patient Details  Name: Maxwell Harmon MRN: [id number] Date of Birth: [birth year]  Today's Date: 08/10/2021 PT Individual Time: 0800-0826 PT Individual Time Calculation (min): 26 min   Short Term Goals: Week 1:  PT Short Term Goal 1 (Week 1): pt will transfer sit<>stand with LRAD and min A PT Short Term Goal 2 (Week 1): pt will transfer stand<>pivot with LRAD and min A PT Short Term Goal 3 (Week 1): pt will perform simulated car transfer with CGA  Skilled Therapeutic Interventions/Progress Updates:   Received pt semi-reclined in bed working on breakfast, pt reported feeling better than yesterday and a little more awake but did not sleep well due to anxiety and is still unable to have BM. Per RN, wound vac removed yesterday and per PA, Pam, pt to leave on gauze and ace wrap and wait to don shrinker until cleared to do so. Therapist re-wrapped L residual limb with total A and pt performed the following exercises with emphasis on LE strength and ROM: -SLR x15 on LLE -hip abduction x15 on LLE -isometric hip adduction squeezes x15 Pt requested to doff scrub pants due to feeling uncomfortable. Rolled L/R with suervision and use of bedrails and doffed pants with max/total A. Concluded session with pt semi-reclined in bed, needs within reach, and bed alarm on.   Therapy Documentation Precautions:  Precautions Precautions: Fall Precaution Comments: wound vac Restrictions Weight Bearing Restrictions: Yes LLE Weight Bearing: Non weight bearing   Therapy/Group: Individual Therapy Alfonse Alpers PT, DPT   08/10/2021, 7:18 AM

## 2021-08-10 NOTE — Consult Note (Addendum)
[facility] Medical Consultation  Maxwell Harmon MIW:[id number] DOB: 1956/09/28 DOA: 08/03/2021 PCP: [name], DO   Requesting physician: [name], PA-C Date of consultation: 08/10/2021 Reason for consultation: Assistance with medical management  Impression/Recommendations   Status post revision of below-knee amputation -Continue wound care -Continue follow-up with orthopedics  ESRD on HD M/W/F -HD per nephrology  Asterixis: The exact cause of symptoms is not totally clear.  Nephrology did not feel symptoms were secondary to uremia makes note opiates, gabapentin, and Lyrica can likely precipitate symptoms in renal patients..  Symptoms do improve  some after hemodialysis. -Pharmacy consult for any other precipitating medications  Constipation: Patient reports last bowel movement was 4 to 5 days ago. -Give lactulose 20 g x 1 dose -Continue scheduled bowel regimen  Hypotension: Blood pressures appear to be somewhat improved from prior after increase of midodrine to 3 times daily. -Try abdominal binder if not done so already   DM type II -Continue current regimen  Insomnia -Agree with trial of trazodone and melatonin  Depression -Continue Effexor  Anemia of chronic kidney disease: Stable  CAD s/p CABG with recent NSTEMI 04/2021 -Continue aspirin, statin, and Plavix   I will followup again tomorrow. Please contact me if I can be of assistance in the meanwhile. Thank you for this consultation.  Chief Complaint: Weakness  HPI:  Maxwell Harmon is a 65 y.o. 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  Following that hospitalization patient went to inpatient rehab from 7/14-8/4.  He was readmitted into the hospital from 8/11-8/14 with a urinary tract  infection.  Subsequently patient had been admitted into the hospital on 9/2 and again on 9/23 by Dr. Sharol Given orthopedics for wound dehiscence requiring revision of left below-knee amputation.  Postoperative course has been complicated by hypotension, fluid overload, postoperative pain, constipation, poor appetite, and depression. Patient had been accepted back to rehab following his most recent hospitalization, but feels like he is not able to do very much.    Patient admits he feels like his leg is never going to heal and that this has made him feel down.  He notes that he has no appetite due to persistent nausea.  Anytime that he tries to rest he feels like he is in a dreamlike state, but can never rest because he is constantly worrying. His daughter is present at bedside and makes note that yesterday the patient was very drowsy and slept most of the day.  Describes her father "as though the lights were on,  but no one is there at times".  He kept having very frequent jerking episodes of his arms that was very concerning.  However after patient received hemodialysis yesterday he seems to be much improved and is having less jerking episodes.  The patient was last able to have a bowel movement approximately 4 or 5 days ago after being given an enema, but feels like he has not had a adequate bowel movement in 2 weeks.  Lastly, patient does also complain of feeling short of breath.  Normally he is not on any oxygen at baseline, but has been on 2 L sal cannula oxygen.  Family question need of patient come back to the inpatient side   Review of Systems: Review of Systems  Constitutional:  Negative for fever.  Respiratory:  Positive for shortness of breath.   Gastrointestinal:  Positive for constipation and nausea.  Musculoskeletal:  Positive for myalgias.  Neurological:  Positive for weakness. Negative for loss of consciousness.  Psychiatric/Behavioral:  Positive for depression. The patient has insomnia.      Past Medical History:  Diagnosis Date   Anemia of chronic disease    Anxiety    CAD (coronary artery disease)    Depression    Diabetes mellitus without complication (HCC)    ESRD (end stage renal disease) on dialysis ([place])    MWF in [place]   History of bleeding ulcers    History of TIAs    Hypertension    PAD (peripheral artery disease) (HCC)    Renal cancer, right ([place])    s/p right radical nephrectomy 01/08/19   Past Surgical History:  Procedure Laterality Date   ABDOMINAL AORTOGRAM W/LOWER EXTREMITY Bilateral 04/14/2021   Procedure: ABDOMINAL AORTOGRAM W/LOWER EXTREMITY;  Surgeon: Elam Dutch, MD;  Location: [place] [facility];  Service: Vascular;  Laterality: Bilateral;   AMPUTATION Left 05/12/2021   Procedure: LEFT BELOW KNEE AMPUTATION;  Surgeon: Newt Minion, MD;  Location: [place];  Service: Orthopedics;  Laterality: Left;   CATARACT EXTRACTION W/PHACO Left 02/15/2014   Procedure: CATARACT EXTRACTION PHACO AND INTRAOCULAR LENS PLACEMENT (IOC);  Surgeon: Tonny Branch, MD;  Location: [facility];  Service: Ophthalmology;  Laterality: Left;  CDE 10.84   CATARACT EXTRACTION W/PHACO Right 12/14/2013   Procedure: CATARACT EXTRACTION PHACO AND INTRAOCULAR LENS PLACEMENT (IOC);  Surgeon: Tonny Branch, MD;  Location: [facility];  Service: Ophthalmology;  Laterality: Right;  CDE:  16.30   CHOLECYSTECTOMY  02/2013   EYE SURGERY     IR FLUORO GUIDE CV LINE RIGHT  01/05/2019   IR US GUIDE VASC ACCESS RIGHT  01/05/2019   LAPAROSCOPIC NEPHRECTOMY Right 01/08/2019   Procedure: LAPAROSCOPIC RADICAL NEPHRECTOMY;  Surgeon: Raynelle Bring, MD;  Location: [facility];  Service: Urology;  Laterality: Right;   LEFT HEART CATH AND CORS/GRAFTS ANGIOGRAPHY N/A 04/14/2021   Procedure: LEFT HEART CATH AND CORS/GRAFTS ANGIOGRAPHY;  Surgeon: Jolaine Artist, MD;  Location: [facility];  Service: Cardiovascular;  Laterality: N/A;   PENILE PROSTHESIS IMPLANT     PERIPHERAL VASCULAR INTERVENTION Left 04/19/2021    Procedure: PERIPHERAL VASCULAR INTERVENTION;  Surgeon: Cherre Robins, MD;  Location: [facility];  Service: Cardiovascular;  Laterality: Left;   STUMP REVISION Left 07/07/2021   Procedure: REVISION LEFT BELOW KNEE AMPUTATION;  Surgeon: Newt Minion, MD;  Location: [place];  Service: Orthopedics;  Laterality: Left;   STUMP REVISION Left 07/28/2021   Procedure: REVISION LEFT BELOW KNEE AMPUTATION;  Surgeon: Newt Minion, MD;  Location: [place];  Service: Orthopedics;  Laterality: Left;   TOE AMPUTATION Right 2013   [facility]   Social History:  reports that he has never smoked. He has never used smokeless tobacco. He reports current alcohol use. He reports that he does not use drugs.  Allergies  Allergen Reactions   Ambien [Zolpidem] Other (See Comments)    It causes pt to have insomnia, NOT sleep- idiosyncratic reaction   Family History  Problem Relation Age of Onset   Stroke Mother    Stroke Father    Cancer Father    Cancer Brother     Prior to Admission medications   Medication Sig Start Date End Date Taking? Authorizing Provider  aspirin EC 81 MG  tablet Take 81 mg by mouth in the morning. Swallow whole.    [provider]  atorvastatin (LIPITOR) 80 MG tablet Take 1 tablet (80 mg total) by mouth daily. Patient taking differently: Take 80 mg by mouth 2 (two) times daily. 06/08/21   Love, Ivan Anchors, PA-C  AURYXIA 1 GM 210 MG(Fe) tablet Take 210-420 mg by mouth See admin instructions. Take 1 tablet by mouth with each home meal, and if eating out take 2 tablet with that meal 06/16/21   [provider]  blood glucose meter kit and supplies KIT Dispense based on patient and insurance preference. Use up to four times daily as directed. (FOR ICD-9 250.00, 250.01). 11/08/15   Cassandria Anger, MD  Blood Glucose Monitoring Suppl (BAYER CONTOUR MONITOR) w/Device KIT 1 each by Does not apply route 4 (four) times daily. Test 4 x daily 11/08/15   Cassandria Anger, MD  CALCIUM PO Take 1 tablet by mouth in the morning and at bedtime.    [provider]  clonazePAM (KLONOPIN) 0.5 MG tablet Take 0.5 mg by mouth 2 (two) times daily as needed for anxiety.    [provider]  clopidogrel (PLAVIX) 75 MG tablet Take 1 tablet (75 mg total) by mouth daily. Patient taking differently: Take 75 mg by mouth at bedtime. 06/08/21   Love, Ivan Anchors, PA-C  furosemide (LASIX) 80 MG tablet Take 1 tablet (80 mg total) by mouth 2 (two) times daily. 06/08/21   Love, Ivan Anchors, PA-C  gabapentin (NEURONTIN) 100 MG capsule Take 100 mg by mouth daily as needed (pain.).    [provider]  glucose blood (BAYER CONTOUR TEST) test strip Use as instructed 4 x daily 11/08/15   Cassandria Anger, MD  Insulin Glargine (BASAGLAR KWIKPEN) 100 UNIT/ML Inject 5 Units into the skin at bedtime. Patient taking differently: Inject 15 Units into the skin at bedtime. 04/20/21 07/27/23  Dessa Phi, DO  insulin lispro (HUMALOG) 100 UNIT/ML KwikPen Inject 0-10 Units into the skin 4 (four) times daily - after meals and at bedtime. Sliding Scale Insulin    [provider]  Insulin Pen Needle (B-D ULTRAFINE III SHORT PEN) 31G X 8 MM MISC 1 each by Does not apply route as directed. 09/02/15   Cassandria Anger, MD  Lancets MISC 1 each by Does not apply route 4 (four) times daily. 11/08/15   Cassandria Anger, MD  metaxalone (SKELAXIN) 400 MG tablet Take 1 tablet (400 mg total) by mouth 3 (three) times daily as needed for muscle spasms (please offer- pt forgets to ask). 06/08/21   Love, Ivan Anchors, PA-C  midodrine (PROAMATINE) 2.5 MG tablet Take 2.5 mg by mouth every Monday, Wednesday, and Friday with hemodialysis. 07/24/21   [provider]  multivitamin (RENA-VIT) TABS tablet Take 1 tablet by mouth at bedtime. 06/08/21   Love, Ivan Anchors, PA-C  nutrition supplement, JUVEN, (JUVEN) PACK Take 1 packet by mouth 2 (two) times daily between meals. 08/03/21    Persons, Bevely Palmer, PA  polyethylene glycol (MIRALAX / GLYCOLAX) 17 g packet Take 17 g by mouth daily as needed for mild constipation. 06/08/21   Love, Ivan Anchors, PA-C  QUEtiapine (SEROQUEL) 50 MG tablet TAKE 1 TABLET BY MOUTH EVERYDAY AT BEDTIME 07/04/21   Lovorn, Jinny Blossom, MD  traZODone (DESYREL) 50 MG tablet Take 50 mg by mouth at bedtime.    [provider]  venlafaxine XR (EFFEXOR-XR) 37.5 MG 24 hr capsule Take 37.5 mg by  mouth every evening. 06/12/21   [provider]  zinc sulfate 220 (50 Zn) MG capsule Take 1 capsule (220 mg total) by mouth daily. Patient taking differently: Take 220 mg by mouth 2 (two) times daily. 05/18/21   Little Ishikawa, MD   Physical Exam:  Constitutional: Ill-appearing male who was in no acute distress at this time Vitals:   08/09/21 1851 08/09/21 2037 08/10/21 0515 08/10/21 0517  BP: 106/62 107/67 121/69   Pulse: 91 90 86   Resp: '12 16 18   ' Temp: 98.1 F (36.7 C) 98.5 F (36.9 C) 98.4 F (36.9 C)   TempSrc:      SpO2: 90% 92% 97%   Weight:    81 kg  Height:       Eyes: PERRL, lids and conjunctivae normal ENMT: Mucous membranes are moist. Posterior pharynx clear of any exudate or lesions.  Neck: normal, supple, no masses, no thyromegaly Respiratory: clear to auscultation bilaterally, no wheezing, no crackles. Normal respiratory effort. No accessory muscle use.  Cardiovascular: Regular rate and rhythm, no murmurs / rubs / gallops. No extremity edema. 2+ pedal pulses. No carotid bruits.  Abdomen: no tenderness, no masses palpated. No hepatosplenomegaly. Bowel sounds positive.  Musculoskeletal: no clubbing / cyanosis.  Left below-knee amputation currently wrapped. Skin:  Neurologic: CN 2-12 grossly intact.  Asterixis Psychiatric: Flat affect.  Patient alert and oriented x3  Labs on Admission:  Basic Metabolic Panel: Recent Labs  Lab 08/04/21 1208 08/07/21 1256 08/10/21 0937  NA 131* 128* 134*  K 3.9 4.4 3.8  CL 93* 91* 95*  CO2  '27 27 29  ' GLUCOSE 53* 150* 94  BUN 62* 73* 31*  CREATININE 5.69* 6.96* 3.97*  CALCIUM 8.6* 8.6* 9.0  PHOS 4.5 4.8*  --    Liver Function Tests: Recent Labs  Lab 08/04/21 1208 08/07/21 1256  ALBUMIN 2.5* 2.6*   No results for input(s): LIPASE, AMYLASE in the last 168 hours. Recent Labs  Lab 08/07/21 1953  AMMONIA 15   CBC: Recent Labs  Lab 08/04/21 1208 08/07/21 1256  WBC 5.3 6.7  HGB 8.9* 9.0*  HCT 29.5* 30.3*  MCV 102.4* 104.8*  PLT 152 152   Cardiac Enzymes: No results for input(s): CKTOTAL, CKMB, CKMBINDEX, TROPONINI in the last 168 hours. BNP: Invalid input(s): POCBNP CBG: Recent Labs  Lab 08/09/21 1139 08/09/21 1856 08/09/21 2022 08/10/21 0625 08/10/21 1200  GLUCAP 92 95 97 79 87    Radiological Exams on Admission: No results found.    Time spent: >45 minutes  [facility]   If 7PM-7AM, please contact night-coverage

## 2021-08-10 NOTE — Progress Notes (Signed)
PROGRESS NOTE   Subjective/Complaints: Had a BM yesterday and no longer feels nauseous this morning.  Continues to have tremor, due for HD today Did not sleep well last night  ROS: denies pain, +nausea, +bilateral hand tremor   Objective:   No results found. No results for input(s): WBC, HGB, HCT, PLT in the last 72 hours.  Recent Labs    08/10/21 0937  NA 134*  K 3.8  CL 95*  CO2 29  GLUCOSE 94  BUN 31*  CREATININE 3.97*  CALCIUM 9.0    Intake/Output Summary (Last 24 hours) at 08/10/2021 1337 Last data filed at 08/10/2021 0834 Gross per 24 hour  Intake 240 ml  Output -465 ml  Net 705 ml         Physical Exam: Vital Signs Blood pressure 121/69, pulse 86, temperature 98.4 F (36.9 C), resp. rate 18, height 5\' 11"  (1.803 m), weight 81 kg, SpO2 97 %. Gen: appears nauseous, weak HEENT: oral mucosa pink and moist, NCAT Cardio: Reg rate Chest: normal effort, normal rate of breathing Abd: soft, non-distended, +nausea Ext: no edema Psych: pleasant, normal affect  Musculoskeletal:        General: Normal range of motion.     Cervical back: Normal range of motion.     Comments: Left knuckles with multiple scabs. Multiple bruises on BUE. L-BKA with compressive stocking and wound VAC in place.  Pes planus deformity right foot without skin breakdown.   Skin:    Coloration: Skin is pale.  Neurological:     Mental Status: He is alert and oriented to person, place, and time.     Comments: Normal language, no focal cn findings. UE grossly 4+/5. RLE 3+ prox to 4+/5 distally, LLE 2/5 proximally and limited by pain. Decreased sensation to PP and LT right foot. +bilateral hand tremor Psychiatric:     Comments: A little flat but generally cooperative      Assessment/Plan: 1. Functional deficits which require 3+ hours per day of interdisciplinary therapy in a comprehensive inpatient rehab setting. Physiatrist is  providing close team supervision and 24 hour management of active medical problems listed below. Physiatrist and rehab team continue to assess barriers to discharge/monitor patient progress toward functional and medical goals  Care Tool:  Bathing    Body parts bathed by patient: Front perineal area, Right upper leg, Left upper leg   Body parts bathed by helper: Buttocks     Bathing assist Assist Level: Minimal Assistance - Patient > 75%     Upper Body Dressing/Undressing Upper body dressing   What is the patient wearing?: Pull over shirt    Upper body assist Assist Level: Minimal Assistance - Patient > 75%    Lower Body Dressing/Undressing Lower body dressing      What is the patient wearing?: Pants     Lower body assist Assist for lower body dressing: Maximal Assistance - Patient 25 - 49% (lateral leans)     Toileting Toileting    Toileting assist Assist for toileting: Contact Guard/Touching assist     Transfers Chair/bed transfer  Transfers assist     Chair/bed transfer assist level: Moderate Assistance - Patient 50 -  74%     Locomotion Ambulation   Ambulation assist   Ambulation activity did not occur: Safety/medical concerns (weakness, decreased balance, fear of falling)  Assist level: Moderate Assistance - Patient 50 - 74% Assistive device: Walker-rolling Max distance: 2'   Walk 10 feet activity   Assist  Walk 10 feet activity did not occur: Safety/medical concerns (weakness, decreased balance, fear of falling)        Walk 50 feet activity   Assist Walk 50 feet with 2 turns activity did not occur: Safety/medical concerns (weakness, decreased balance, fear of falling)         Walk 150 feet activity   Assist Walk 150 feet activity did not occur: Safety/medical concerns (weakness, decreased balance, fear of falling)         Walk 10 feet on uneven surface  activity   Assist Walk 10 feet on uneven surfaces activity did not  occur: Safety/medical concerns (weakness, decreased balance, fear of falling)         Wheelchair     Assist Is the patient using a wheelchair?: Yes Type of Wheelchair: Manual    Wheelchair assist level: Supervision/Verbal cueing Max wheelchair distance: 13ft    Wheelchair 50 feet with 2 turns activity    Assist        Assist Level: Supervision/Verbal cueing   Wheelchair 150 feet activity     Assist      Assist Level: Maximal Assistance - Patient 25 - 49%   Blood pressure 121/69, pulse 86, temperature 98.4 F (36.9 C), resp. rate 18, height 5\' 11"  (1.803 m), weight 81 kg, SpO2 97 %.  Medical Problem List and Plan: 1.  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  Impaired mobility -DVT/anticoagulation:  Pharmaceutical: Heparin added             -antiplatelet therapy: Continue ASA/Plavix 3. Pain: Decrease Diluadid to 0.5mg  6H prn. Oxycodone prn.              d/c gabapentin given asterixis 4. Mood: LCSW to follow for evaluation and support.              -antipsychotic agents: N/A 5. Neuropsych: This patient is capable of making decisions on his own behalf. 6. Skin/Wound Care: Continue wound VAC for couple more days per ortho              --continue Vitamin C and Zinc with juven bid to promote wound healing.  7. Fluids/Electrolytes/Nutrition: Strict I/O. Routine check of labs with HD. 8. T2DM: Monitor BS ac/hs and use SSI for elevated BS             --Continue insulin Glargine with 2 units Novolog TID CBG (last 3)  Recent Labs    08/09/21 2022 08/10/21 0625 08/10/21 1200  GLUCAP 97 79 87  Controlled, 10/5- continue above regimen.   9. ESRD: HD MWF at the end of the day to help with tolerance of therapy.              --on Sensipar and ferric citrate for metabolic  bone disease.   -palliative care consulted as per family request Increase midodrine to 10mg  TID so  he can tolerate more ultrafiltration 10. Anemia of chronic disease: Started on weekly [name] 09/28.              --routine H/H check with HD.  11. Hypotension: Increase midodrine to 10mg  TID so he can tolerate more ultrafiltration 12. Depression with acute reaction?: On Effexor with trazodone to help with sleep.              --Klonopin prn for anxiety.  13. CAD: On ASA/Plavix and Lipitor. Monitor for symptoms with increase in activity.  14.  PAD: Continue Trental TID.  15.  Constipation:  Increase colace to 200mg  daily 16. Nausea: Improved with resolution of constipation.  17. Bilateral hand tremor: unknown cause- ammonia normal. Discussed with nephrology and consulted medicine. D/c Gabapentin      LOS: 7 days A FACE TO FACE EVALUATION WAS PERFORMED  Maxwell Harmon 08/10/2021, 1:37 PM

## 2021-08-10 NOTE — Progress Notes (Signed)
Pt bed has been exchanged, and pt is now in a airflow mattress.  Maxwell Harmon.

## 2021-08-10 NOTE — Progress Notes (Signed)
Occupational Therapy Session Note  Patient Details  Name: Maxwell Harmon MRN: [id number] Date of Birth: [birth year]  Today's Date: 08/10/2021 OT Individual Time: 2423-5361 OT Individual Time Calculation (min): 17 min    Short Term Goals: Week 1:  OT Short Term Goal 1 (Week 1): Pt will perform LB dressing with MIN A OT Short Term Goal 2 (Week 1): Pt will complete MIN A stand > pivot transfer with LRAD to [facility] OT Short Term Goal 3 (Week 1): Pt will tolerate activity for 5 minutes w/o requiring external O2 support  Skilled Therapeutic Interventions/Progress Updates:  Pt greeted supine in bed agreeable to OT intervention. Session focus on BADL reeducation. Pt requesting to don pants. Pt required MOD A for bed mobility needing assist to elevate trunk into sitting and scoot hips to EOB. Pt required MAX A for LB dressing from EOB, utilized lateral leans to pull pants up to waist line. Pt nauseous throughout session and and reports fatigue. Pt returned to supine with CGA where pt left supine in bed with bed alarm activated and all needs within reach.   Therapy Documentation Precautions:  Precautions Precautions: Fall Precaution Comments: wound vac Restrictions Weight Bearing Restrictions: Yes LLE Weight Bearing: Non weight bearing   Pain: Pt reports unrated general pain, offered rest breaks as needed and repositioning.     Therapy/Group: Individual Therapy  Precious Haws 08/10/2021, 12:12 PM

## 2021-08-10 NOTE — Progress Notes (Signed)
Orthopedic Tech Progress Note Patient Details:  Maxwell Harmon 1956/06/26 [id number] Ordered shrinker from [place] Patient ID: [name], male   DOB: Sep 15, 1956, 65 y.o.   MRN: [id number]  [name] 08/10/2021, 3:10 PM

## 2021-08-10 NOTE — Consult Note (Signed)
Consultation Note Date: 08/10/2021   Patient Name: Maxwell Harmon  DOB: 10-Feb-1956  MRN: [id number]  Age / Sex: 65 y.o., male  PCP: [name], DO Referring Physician: Izora Ribas, MD  Reason for Consultation: Establishing goals of care "discuss palliative care/hospice with family"  HPI/Patient Profile: 65 y.o. male  with past medical history of renal cancer s/p nephrectomy, ESRD on HD MWF), CAD s/p CABG in 2021, HFpEF, and severe peripheral artery disease. He was hospitalized 05/07/21 - 05/18/21 with recurrent ulcer and worsening pain in his LLE.  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 He has had issues with wound dehiscence and was admitted on 07/27/21 for revision of left BKA by Dr. Sharol Given. Post operative course was complicated by hypotension, hypoxia, and volume overload as well as pain and depression symptoms. Due to functional decline, he was admitted to [name] on 08/03/2021.   Clinical Assessment and Goals of Care: I have reviewed medical records including EPIC notes, labs and imaging, and met at bedside with patient and his wife Angie  to discuss diagnosis, prognosis, GOC, disposition, and options. Patient is tired from therapy and not able to engage in conversation at this time.   I introduced Palliative Medicine as specialized medical care for people living with serious illness. It focuses on providing relief from the symptoms and stress of a serious illness.   We discussed a brief life review of the patient. He and Angie have been married for 25 years. They live in their home in [name], [place]. He has 3 daughters from a previous marriage. 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 He enjoys golfing, fishing, and traveling.   As far as functional status, there has been decline over the past 4 months (since June). During hospitalization in June, he was  ambulatory with a rolling walker with minimal assist. He has obviously not been ambulatory since the left BKA. He was using a wheelchair for mobility at home. He has not been able to pursue prosthesis due to wound dehiscence and poor healing. Discussed that he has required 2 revision surgeries. Angie expresses concern that he ultimately might need an AKA.   We discussed his current illness and what it means in the larger context of his ongoing co-morbidities. Natural disease trajectory of chronic illness was discussed.  We discussed symptom management in detail.  Sleep disturbance: Patient has difficulty falling asleep and staying asleep. Angie thinks it is due to anxiety from his declining health and multiple hospitalizations.  Nausea: present for 2 months, worse in the morning, worse with eating. Poor appetite: present for several months Constipation: present for several months, may be opioid induced  I attempted to elicit values and goals of care important to the patient.  Angie states the past few months have been extremely difficult, but she remains hopeful for patient's improvement. The plan is for him to return home after discharge from [name]. She states patient is motivated to get better, but  expresses concern that his body remains in a weakened state.  For now they want to "try all we can" with the goal of improved functional status.   Advanced directives, concepts specific to code status, artifical feeding and hydration, and rehospitalization were considered and discussed. MOST form was introduced. I did confirm that patient wishes to remain full code at this time.   Hospice and Palliative Care services outpatient were explained and offered. Discussed the difference between Palliative care and Hospice care. Palliative care and hospice have similar goals of managing symptoms, promoting comfort, improving quality of life, and maintaining a person's dignity. However, palliative care may be offered  during any phase of a serious illness, while hospice care is usually offered when a person is expected to live for 6 months or less. Angie would like for him to receive outpatient palliative services at home. Discussed making a referral to an agency that serves the [place] area.   Questions and concerns were addressed.  The family was encouraged to call with questions or concerns.   Primary decision maker: Patient with support from his wife.     SUMMARY OF RECOMMENDATIONS   Full code full scope Will assist patient with creating advanced directives Outpatient palliative referral  Patient has significant symptom management needs (see below) PMT will continue to follow   Symptom Management:  Start sennakot 2 tablets daily Consider starting linzess (It looks like he was on this during his previous admission to [name]) Continue - Oxycodone IR 5 mg every 4 hours as needed for pain Continue - Trazodone 50 mg daily at bedtime Continue - Venlafaxine XR 37.5 mg daily Continue - Ondansetron (Zofran) OR prochlorperazine (Compazine) prn for nausea  Code Status/Advance Care Planning: Full code  Palliative Prophylaxis:  Frequent Pain Assessment, Oral Care, and Turn Reposition  Additional Recommendations (Limitations, Scope, Preferences): Full Scope Treatment  Psycho-social/Spiritual:  Created space and opportunity for patient and family to express thoughts and feelings regarding patient's current medical situation.  Emotional support provided   Prognosis:  Unable to determine  Discharge Planning: To Be Determined      Primary Diagnoses: Present on Admission:  PAD (peripheral artery disease) ([place])   I have reviewed the medical record, interviewed the patient and family, and examined the patient. The following aspects are pertinent.  Past Medical History:  Diagnosis Date   Anemia of chronic disease    Anxiety    CAD (coronary artery disease)    Depression    Diabetes mellitus  without complication (HCC)    ESRD (end stage renal disease) on dialysis ([facility])    MWF in [place]   History of bleeding ulcers    History of TIAs    Hypertension    PAD (peripheral artery disease) (HCC)    Renal cancer, right (HCC)    s/p right radical nephrectomy 01/08/19     Family History  Problem Relation Age of Onset   Stroke Mother    Stroke Father    Cancer Father    Cancer Brother    Scheduled Meds:  vitamin C  1,000 mg Oral Daily   aspirin EC  81 mg Oral Daily   atorvastatin  40 mg Oral Daily   cinacalcet  30 mg Oral Weekly   clopidogrel  75 mg Oral Daily   darbepoetin ([name]) injection - DIALYSIS  200 mcg Intravenous Q Wed-HD   [START ON 08/11/2021] docusate sodium  200 mg Oral Daily   ferric citrate  420 mg Oral TID with  meals   heparin  5,000 Units Subcutaneous Q8H   insulin aspart  0-6 Units Subcutaneous TID WC   insulin glargine-yfgn  5 Units Subcutaneous QHS   melatonin  3 mg Oral QHS   midodrine  10 mg Oral TID WC   multivitamin  1 tablet Oral QHS   nutrition supplement (JUVEN)  1 packet Oral BID BM   pantoprazole  40 mg Oral Daily   traZODone  50 mg Oral QHS   venlafaxine XR  37.5 mg Oral QPM   Continuous Infusions: PRN Meds:.acetaminophen, bisacodyl, calcium carbonate, clonazePAM, diphenhydrAMINE, gabapentin, HYDROmorphone (DILAUDID) injection, methocarbamol, ondansetron (ZOFRAN) IV, oxyCODONE, phenol, polyethylene glycol, prochlorperazine **OR** prochlorperazine **OR** prochlorperazine, simethicone, sorbitol, temazepam  Allergies  Allergen Reactions   Ambien [Zolpidem] Other (See Comments)    It causes pt to have insomnia, NOT sleep- idiosyncratic reaction   Review of Systems  Constitutional:  Positive for appetite change.  Gastrointestinal:  Positive for nausea.  Psychiatric/Behavioral:  Positive for sleep disturbance.    Physical Exam Vitals reviewed.  Constitutional:      General: He is not in acute distress.    Appearance: He is  ill-appearing.  Pulmonary:     Effort: Pulmonary effort is normal.  Musculoskeletal:     Left Lower Extremity: Left leg is amputated below knee.  Neurological:     Mental Status: He is alert and oriented to person, place, and time.     Motor: Weakness present.  Psychiatric:        Mood and Affect: Affect is flat.    Vital Signs: BP 121/69 (BP Location: Right Arm)   Pulse 86   Temp 98.4 F (36.9 C)   Resp 18   Ht 5' 11" (1.803 m)   Wt 81 kg   SpO2 97%   BMI 24.91 kg/m  Pain Scale: 0-10 POSS *See Group Information*: S-Acceptable,Sleep, easy to arouse Pain Score: 6    SpO2: SpO2: 97 % O2 Device:SpO2: 97 % O2 Flow Rate: .O2 Flow Rate (L/min): 3 L/min  IO: Intake/output summary:  Intake/Output Summary (Last 24 hours) at 08/10/2021 1227 Last data filed at 08/10/2021 0834 Gross per 24 hour  Intake 360 ml  Output -465 ml  Net 825 ml    LBM: Last BM Date: 08/08/21 Baseline Weight: Weight: 80.2 kg Most recent weight: Weight: 81 kg      Palliative Assessment/Data: PPS 40%     Time In: 1400 Time Out: 1513 Time Total: 73 minutes Greater than 50%  of this time was spent counseling and coordinating care related to the above assessment and plan.  Signed by: Lavena Bullion, NP   Please contact Palliative Medicine Team phone at [phone] for questions and concerns.  For individual provider: See Shea Evans

## 2021-08-10 NOTE — Progress Notes (Signed)
Physical Therapy Session Note   Patient Details  Name: Maxwell Harmon MRN: [id number] Date of Birth: 09/08/1956  Today's Date: 08/10/2021 PT Individual Time: 1105-1128 PT Individual Time Calculation (min): 23 min (make up)   Short Term Goals: Week 1:  PT Short Term Goal 1 (Week 1): pt will transfer sit<>stand with LRAD and min A PT Short Term Goal 2 (Week 1): pt will transfer stand<>pivot with LRAD and min A PT Short Term Goal 3 (Week 1): pt will perform simulated car transfer with CGA  Skilled Therapeutic Interventions/Progress Updates: Pt presented in bed pain in LLE recently pre-medicated. Pt initially refusing any intervention or desire for make up time, however after discussion with nsg pt has new airflow bed and necessary to change beds. Pt with c/o nausea and emesis bag nearby during session. Pt performed supine to sit with minA for truncal support  and use of bed features. PTA set up Slide board total A and pt performed Slide board transfer [place]. Pt required cues for hand placement, increasing anterior lean and head/hip relationship. Pt declining any additional activity while nsg exchanged beds. PTA set up Slide board for transfer to new bed. Pt with improved hand placement but continued to require modA for actual transfer. Pt was able to perform sit to supine with CGA and bed flat with use of bed rail. Pt was able to perform lateral shifts to reposition to comfort. Pt left in bed with nsg present.      Therapy Documentation Precautions:  Precautions Precautions: Fall Precaution Comments: wound vac Restrictions Weight Bearing Restrictions: Yes LLE Weight Bearing: Non weight bearing General:   Vital Signs:   Pain: Pain Assessment Pain Scale: 0-10 Pain Score: 6  Pain Type: Surgical pain Pain Location: Leg Pain Orientation: Left Pain Radiating Towards: Stump Pain Descriptors / Indicators: Aching;Constant;Crushing Pain Frequency: Constant Pain Onset: On-going Patients  Stated Pain Goal: 2 Pain Intervention(s):  (Patient stated that pain meds didn't relieve pain at all)   Therapy/Group: Individual Therapy  Eldora Napp Jamielee Mchale, PTA  08/10/2021, 12:12 PM

## 2021-08-10 NOTE — Progress Notes (Signed)
Incision C/D with erythema at edges, linear blister on inferior aspect and some ischemic changes on incision line laterally. Erythema on knee likely due to pressure from ace wrap--unable to find shriker in room --order placed. Discussed importance of diet to promote healing as intake has been minimal as well as perfusion issues due to hypotension--now on midodrine TID to help with dizziness/nausea/fatigue with therapy attempts. He is willing to try appetite stimulant--will reach out to Dr. Jonnie Finner for input. Secure chat to Dr. Sharol Given to evaluate wound. Dr. Harvest Forest consulted for second opinion/input per family request. Palliative care consulted to help discuss goals of care.

## 2021-08-11 DIAGNOSIS — Z89512 Acquired absence of left leg below knee: Secondary | ICD-10-CM | POA: Diagnosis not present

## 2021-08-11 DIAGNOSIS — Z515 Encounter for palliative care: Secondary | ICD-10-CM | POA: Diagnosis not present

## 2021-08-11 DIAGNOSIS — I739 Peripheral vascular disease, unspecified: Secondary | ICD-10-CM | POA: Diagnosis not present

## 2021-08-11 DIAGNOSIS — Z7189 Other specified counseling: Secondary | ICD-10-CM | POA: Diagnosis not present

## 2021-08-11 LAB — RENAL FUNCTION PANEL
Albumin: 2.7 g/dL — ABNORMAL LOW (ref 3.5–5.0)
Anion gap: 10 (ref 5–15)
BUN: 43 mg/dL — ABNORMAL HIGH (ref 8–23)
CO2: 27 mmol/L (ref 22–32)
Calcium: 9 mg/dL (ref 8.9–10.3)
Chloride: 93 mmol/L — ABNORMAL LOW (ref 98–111)
Creatinine, Ser: 5.03 mg/dL — ABNORMAL HIGH (ref 0.61–1.24)
GFR, Estimated: 12 mL/min — ABNORMAL LOW (ref >60)
Glucose, Bld: 104 mg/dL — ABNORMAL HIGH (ref 70–99)
Phosphorus: 3.1 mg/dL (ref 2.5–4.6)
Potassium: 3.8 mmol/L (ref 3.5–5.1)
Sodium: 130 mmol/L — ABNORMAL LOW (ref 135–145)

## 2021-08-11 LAB — CBC
HCT: 30.2 % — ABNORMAL LOW (ref 39.0–52.0)
Hemoglobin: 9 g/dL — ABNORMAL LOW (ref 13.0–17.0)
MCH: 31.8 pg (ref 26.0–34.0)
MCHC: 29.8 g/dL — ABNORMAL LOW (ref 30.0–36.0)
MCV: 106.7 fL — ABNORMAL HIGH (ref 80.0–100.0)
Platelets: 146 10*3/uL — ABNORMAL LOW (ref 150–400)
RBC: 2.83 MIL/uL — ABNORMAL LOW (ref 4.22–5.81)
RDW: 17.4 % — ABNORMAL HIGH (ref 11.5–15.5)
WBC: 6.2 10*3/uL (ref 4.0–10.5)
nRBC: 0.6 % — ABNORMAL HIGH (ref 0.0–0.2)

## 2021-08-11 LAB — HEPATIC FUNCTION PANEL
ALT: 11 U/L (ref 0–44)
AST: 21 U/L (ref 15–41)
Albumin: 2.7 g/dL — ABNORMAL LOW (ref 3.5–5.0)
Alkaline Phosphatase: 108 U/L (ref 38–126)
Bilirubin, Direct: 0.2 mg/dL (ref 0.0–0.2)
Indirect Bilirubin: 0.6 mg/dL (ref 0.3–0.9)
Total Bilirubin: 0.8 mg/dL (ref 0.3–1.2)
Total Protein: 5.8 g/dL — ABNORMAL LOW (ref 6.5–8.1)

## 2021-08-11 LAB — GLUCOSE, CAPILLARY
Glucose-Capillary: 101 mg/dL — ABNORMAL HIGH (ref 70–99)
Glucose-Capillary: 106 mg/dL — ABNORMAL HIGH (ref 70–99)
Glucose-Capillary: 83 mg/dL (ref 70–99)

## 2021-08-11 LAB — TSH: TSH: 6.426 u[IU]/mL — ABNORMAL HIGH (ref 0.350–4.500)

## 2021-08-11 LAB — HEPATITIS B SURFACE ANTIBODY, QUANTITATIVE: Hep B S AB Quant (Post): 89 m[IU]/mL (ref >9.9)

## 2021-08-11 LAB — VITAMIN B12: Vitamin B-12: 644 pg/mL (ref 180–914)

## 2021-08-11 MED ORDER — OXYCODONE HCL 5 MG PO TABS
2.5000 mg | ORAL_TABLET | ORAL | Status: DC | PRN
  Administered 2021-08-15 – 2021-08-22 (×5): 2.5 mg via ORAL
  Filled 2021-08-11 (×6): qty 1

## 2021-08-11 MED ORDER — TRAZODONE HCL 50 MG PO TABS
100.0000 mg | ORAL_TABLET | Freq: Every day | ORAL | Status: DC
  Administered 2021-08-11 – 2021-08-12 (×2): 100 mg via ORAL
  Filled 2021-08-11 (×2): qty 2

## 2021-08-11 MED ORDER — FLEET ENEMA 7-19 GM/118ML RE ENEM: 1.0000 | ENEMA | Freq: Once | RECTAL | Status: DC

## 2021-08-11 MED ORDER — HEPARIN SODIUM (PORCINE) 1000 UNIT/ML DIALYSIS
2000.0000 [IU] | INTRAMUSCULAR | Status: DC | PRN
  Filled 2021-08-11: qty 2

## 2021-08-11 NOTE — Progress Notes (Signed)
Patient ID: Maxwell Harmon, male   DOB: 09-05-1956, 65 y.o.   MRN: [id number]  [facility] Progress Note   Assessment/ Plan:   1.  Status post revision of left below-knee amputation: With intermittent breakthrough pain ongoing management as directed by orthopedic surgery.  Admitted to inpatient rehabilitation unit. 2. ESRD: on HD MWF. HD today.   3. Anemia: Hemoglobin 9's, resumed [name]200 ug weekly while here on Wed's. 4. CKD-MBD: Calcium level acceptable with improving phosphorus levels on [place]. 5. Nutrition: Ongoing renal diet with protein supplementation and renal multivitamin.  Fluid restriction. 6. Hypertension/volume excess: With episodes of intradialytic hypotension-on midodrine 10 mg twice daily.  7. Asterixis - not uremic and NH3 is wnl. Avoid gabapentin/ lyrica and narcotics as these can cause asterixis in ESRD pts. Oxycodone is being tapered down and is likely the cause.    Kelly Splinter, MD 08/11/2021, 12:35 PM    Subjective:   No c/o today   Objective:   BP 115/70 (BP Location: Right Arm)   Pulse 91   Temp 97.8 F (36.6 C) (Oral)   Resp 16   Ht 5\' 11"  (1.803 m)   Wt 81 kg   SpO2 97%   BMI 24.91 kg/m   Physical Exam: Gen: no distress, Ox 3 CVS: Pulse regular rhythm, normal rate, S1 and S2 normal Resp: Clear to auscultation bilaterally, no rales/rhonchi Abd: Soft, obese, nontender, bowel sounds normal Ext: Left BKA with shrinker, bilat 1+ hip/ dependent edema  LUA AVF+ bruit Neuro: nonfocal, Ox 3, mild asterixis  Labs: BMET Recent Labs  Lab 08/07/21 1256 08/10/21 0937  NA 128* 134*  K 4.4 3.8  CL 91* 95*  CO2 27 29  GLUCOSE 150* 94  BUN 73* 31*  CREATININE 6.96* 3.97*  CALCIUM 8.6* 9.0  PHOS 4.8*  --     CBC Recent Labs  Lab 08/07/21 1256  WBC 6.7  HGB 9.0*  HCT 30.3*  MCV 104.8*  PLT 152       Medications:     vitamin C  1,000 mg Oral Daily   aspirin EC  81 mg Oral Daily   atorvastatin  40 mg Oral Daily    cinacalcet  30 mg Oral Weekly   clopidogrel  75 mg Oral Daily   darbepoetin ([name]) injection - DIALYSIS  200 mcg Intravenous Q Wed-HD   docusate sodium  200 mg Oral Daily   ferric citrate  420 mg Oral TID with meals   heparin  5,000 Units Subcutaneous Q8H   insulin aspart  0-6 Units Subcutaneous TID WC   insulin glargine-yfgn  5 Units Subcutaneous QHS   melatonin  3 mg Oral QHS   midodrine  10 mg Oral TID WC   multivitamin  1 tablet Oral QHS   nutrition supplement (JUVEN)  1 packet Oral BID BM   pantoprazole  40 mg Oral Daily   senna  2 tablet Oral Daily   traZODone  100 mg Oral QHS   venlafaxine XR  37.5 mg Oral QPM

## 2021-08-11 NOTE — Progress Notes (Addendum)
Physical Therapy Session Note  Patient Details  Name: Maxwell Harmon MRN: [id number] Date of Birth: Jan 16, 1956  Today's Date: 08/11/2021 PT Individual Time: 9528-4132 and 4401-0272 PT Individual Time Calculation (min): 40 min and 31 min PT Missed Time: 14 minutes PT Missed Time Reason: Fatigue  Short Term Goals: Week 1:  PT Short Term Goal 1 (Week 1): pt will transfer sit<>stand with LRAD and min A PT Short Term Goal 1 - Progress (Week 1): Progressing toward goal PT Short Term Goal 2 (Week 1): pt will transfer stand<>pivot with LRAD and min A PT Short Term Goal 2 - Progress (Week 1): Progressing toward goal PT Short Term Goal 3 (Week 1): pt will perform simulated car transfer with CGA PT Short Term Goal 3 - Progress (Week 1): Progressing toward goal Week 2:  PT Short Term Goal 1 (Week 2): pt will transfer sit<>stand with LRAD and min A PT Short Term Goal 2 (Week 2): pt will transfer stand<>pivot with LRAD and min A PT Short Term Goal 3 (Week 2): pt will perform simulated car transfer with CGA  Skilled Therapeutic Interventions/Progress Updates:  Treatment Session 1 Received pt semi-reclined in bed with daughter and RN present at bedside. Pt agreeable to PT treatment and denied any pain during session. Session with emphasis on dressing, functional mobility/transfers, generalized strengthening, dynamic sitting balance/coordination, and improved activity tolerance. Pt on 2L O2 via [place] with O2 sat 99% throughout. Donned pants in supine with max A and pt transferred supine<>sitting EOB with min A and HOB elevated. Doffed dirty shirt and donned clean one with min A but required CGA/min A for sitting balance due to spontaneous spasms throwing pf off balance. Donned R shoe and limb guard with max A and pt transferred bed<>WC via slideboard with mod A and cues for anterior weight shifting and head/hips relationship. Pt began feeling nauseous but with no active emesis episodes, just dry heaving.  Notified RN to provide anti-nausea medication and reached out to MD to update on symptoms as pt's daughter with multiple questions regarding medications. Concluded session with pt sitting in WC, needs within reach, and seatbelt alarm on. MD and RN present at bedside.   Treatment Session 2 Received pt sitting in [facility] and denied any pain during session but reported discomfort on tailbone from sitting. Encouraged pressure relief and pt performed WC pushup x only 1 rep with CGA to fatigue. Pt requested to return to bed but agreeable to go to gym with encouragement. Session with emphasis on functional mobility/transfers, generalized strengthening, dynamic sitting balance/coordination, and improved activity tolerance. Pt transported to/from room in [facility] total A for time management and energy conservation purposes. Pt performed BUE strengthening on UBE at level 2.5 for 3 minutes forward and 3 minutes backwards with emphasis on UE strengthening. Pt's LUE frequently slipping off handgrip, possibly due to spasms in UEs and required min verbal stimuli to maintain arousal. Attempted to check O2 sats but portable pulse ox inaccurate; pt denied any symptoms. Returned to room and transferred WC<>bed via slideboard to L with min/mod A and sit<>supine with CGA. Concluded session with pt semi-reclined in bed asleep with all needs within reach. 14 minutes missed of skilled physical therapy due to fatigue.   Therapy Documentation Precautions:  Precautions Precautions: Fall Precaution Comments: wound vac Restrictions Weight Bearing Restrictions: Yes LLE Weight Bearing: Non weight bearing  Therapy/Group: Individual Therapy Alfonse Alpers PT, DPT   08/11/2021, 7:28 AM

## 2021-08-11 NOTE — Progress Notes (Signed)
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 Sharol Given orthopedics for wound dehiscence requiring revision of left below-knee amputation.  Postop course complicated by hypotension fluid overload postop pain constipation poor appetite depression and patient transferred to inpatient rehab. Patient was having jerking episodes of his arms and hospitalist was consulted.  He has been having constipation, pain issues.  Subjective: Seen in dialysis.  He reports jerking seems to have improved he feels better today No chest pain nausea vomiting or fever.  Assessment & Plan:  Asterixis bilateral hands: Unclear etiology most likely medication induced patient on opiates Neurontin Lyrica in the setting of ESRD.  Ammonia level normal.  Off gabapentin and oxycodone dose decreased to 2.5 mg every 4 hours as needed and asterixis seems to have resolved.  We will check B12 TSH and LFTs for completeness.  ESRD on HD MWF on dialysis, per nephrology Anemia of chronic renal disease: Continue weekly [name] monitor H&H Volume/hypotension: Blood pressure is stable, having episodes of intradialytic hypotension so on midodrine Metabolic bone disease: Monitor calcium Phos continue Sensipar, Auryxia Hyponatremia adjust with dialysis.  CAD/CABG history: No chest pain continue current antiplatelets and statin  PAD status post left BKA revision: Continue wound care.  Continue aspirin Plavix and statin  T2DM continue current insulin regimen and monitor CBG Recent Labs  Lab 08/10/21 1200 08/10/21 1641 08/10/21 2118 08/11/21 0651  08/11/21 1115  GLUCAP 87 126* 129* 101* 106*    Constipation continue Colace 200 daily, Senokot, tapwater enema Insomnia on trazodone increased to 100 Nausea improved with resolution of constipation  Depression: In the setting of multiple comorbidities recent surgery on Effexor and trazodone, Klonopin as needed  Functional and mobility deficits in the setting of PAD left BKA BKA revision [place] [name]  Diet Order             Diet renal with fluid restriction Fluid restriction: 1200 mL Fluid; Room service appropriate? Yes; Fluid consistency: Thin  Diet effective now                 DVT prophylaxis: heparin injection 5,000 Units Start: 08/03/21 1630 Code Status:   Code Status: Full Code  Family Communication: plan of care discussed with patient at bedside. discussed with nephrology team also with rehab MD  Objective: Vitals: Today's Vitals   08/11/21 1500 08/11/21 1529 08/11/21 1601 08/11/21 1631  BP: 140/76 140/76 135/75 123/70  Pulse: 86 86 86 85  Resp:      Temp: 98.3 F (36.8 C) 98.3 F (36.8 C)    TempSrc:  Oral    SpO2: 99% 100%  99%  Weight:      Height:      PainSc:       Physical Examination: General exam: AA0x3, weak,older than stated age. HEENT:Oral mucosa moist, Ear/Nose WNL grossly,dentition normal. Respiratory system: B/l diminished BS, no use of accessory muscle, non tender. Cardiovascular system: S1 & S2 +,No JVD. Gastrointestinal system: Abdomen soft, NT,ND, BS+. Nervous System:Alert, awake, moving extremities. Extremities: lt bka Skin: No rashes, no icterus. MSK: Normal muscle bulk,tone, power.  Medications reviewed:  Scheduled Meds:  vitamin C  1,000 mg Oral Daily   aspirin EC  81 mg Oral Daily   atorvastatin  40 mg Oral Daily   cinacalcet  30 mg Oral Weekly   clopidogrel  75 mg Oral Daily   darbepoetin ([name]) injection - DIALYSIS  200 mcg Intravenous Q Wed-HD   docusate sodium  200 mg Oral Daily   ferric citrate  420 mg Oral TID with  meals   heparin  5,000 Units Subcutaneous Q8H   insulin aspart  0-6 Units Subcutaneous TID WC   insulin glargine-yfgn  5 Units Subcutaneous QHS   melatonin  3 mg Oral QHS   midodrine  10 mg Oral TID WC   multivitamin  1 tablet Oral QHS   nutrition supplement (JUVEN)  1 packet Oral BID BM   pantoprazole  40 mg Oral Daily   senna  2 tablet Oral Daily   traZODone  100 mg Oral QHS   venlafaxine XR  37.5 mg Oral QPM   Continuous Infusions:  Intake/Output  Intake/Output Summary (Last 24 hours) at 08/11/2021 1636 Last data filed at 08/11/2021 1321 Gross per 24 hour  Intake 420 ml  Output --  Net 420 ml   Intake/Output from previous day: 10/06 0701 - 10/07 0700 In: 320 [P.O.:320] Out: -  Net IO Since Admission: -1,452 mL [08/11/21 1636]   Weight change:   Wt Readings from Last 3 Encounters:  08/10/21 81 kg  08/02/21 80.2 kg  07/08/21 87 kg     Consultants:see note  Procedures:see note Antimicrobials: Anti-infectives (From admission, onward)    None      Culture/Microbiology    Component Value Date/Time   SDES URINE, CATHETERIZED 06/16/2021 0640   SPECREQUEST  06/16/2021 0640    NONE Performed at [facility] [address] [place], [place] [address]    CULT >=100,000 COLONIES/mL KLEBSIELLA PNEUMONIAE (A) 06/16/2021 0640   REPTSTATUS 06/18/2021 FINAL 06/16/2021 0640    Other culture-see note  Unresulted Labs (From admission, onward)     Start     Ordered   08/11/21 1509  CBC  ONCE - STAT,   STAT        08/11/21 1508   08/11/21 1242  Hepatic function panel  Once-Timed,   TIMED       Comments: With hemodialysis    08/11/21 1242   08/11/21 1242  TSH  Once-Timed,   TIMED       Comments: With hemodialysis    08/11/21 1242   08/11/21 1242  Vitamin B12  Once-Timed,   TIMED       Comments: With hemodialysis    08/11/21 1242            Data Reviewed: I have personally reviewed following labs and imaging studies CBC: Recent Labs  Lab  08/07/21 1256  WBC 6.7  HGB 9.0*  HCT 30.3*  MCV 104.8*  PLT 638   Basic Metabolic Panel: Recent Labs  Lab 08/07/21 1256 08/10/21 0937 08/11/21 1509  NA 128* 134* 130*  K 4.4 3.8 3.8  CL 91* 95* 93*  CO2 27 29 27   GLUCOSE 150* 94 104*  BUN 73* 31* 43*  CREATININE 6.96* 3.97* 5.03*  CALCIUM 8.6* 9.0 9.0  PHOS 4.8*  --  3.1   GFR: Estimated Creatinine Clearance: 15.8 mL/min (A) (by C-G formula based on SCr of 5.03 mg/dL (H)). Liver Function Tests: Recent Labs  Lab 08/07/21 1256 08/11/21 1509  ALBUMIN 2.6* 2.7*   No results  for input(s): LIPASE, AMYLASE in the last 168 hours. Recent Labs  Lab 08/07/21 1953  AMMONIA 15   Coagulation Profile: No results for input(s): INR, PROTIME in the last 168 hours. Cardiac Enzymes: No results for input(s): CKTOTAL, CKMB, CKMBINDEX, TROPONINI in the last 168 hours. BNP (last 3 results) No results for input(s): PROBNP in the last 8760 hours. HbA1C: No results for input(s): HGBA1C in the last 72 hours. CBG: Recent Labs  Lab 08/10/21 1200 08/10/21 1641 08/10/21 2118 08/11/21 0651 08/11/21 1115  GLUCAP 87 126* 129* 101* 106*   Lipid Profile: No results for input(s): CHOL, HDL, LDLCALC, TRIG, CHOLHDL, LDLDIRECT in the last 72 hours. Thyroid Function Tests: No results for input(s): TSH, T4TOTAL, FREET4, T3FREE, THYROIDAB in the last 72 hours. Anemia Panel: No results for input(s): VITAMINB12, FOLATE, FERRITIN, TIBC, IRON, RETICCTPCT in the last 72 hours. Sepsis Labs: No results for input(s): PROCALCITON, LATICACIDVEN in the last 168 hours.  No results found for this or any previous visit (from the past 240 hour(s)).   Radiology Studies: No results found.   LOS: 8 days   Antonieta Pert, MD [facility]  08/11/2021, 4:36 PM

## 2021-08-11 NOTE — Progress Notes (Signed)
Daily Progress Note   Patient Name: Maxwell Harmon       Date: 08/11/2021 DOB: [birth year]  Age: 65 y.o. MRN#: [id number] Attending Physician: Izora Ribas, MD Primary Care Physician: [name], DO Admit Date: 08/03/2021  Reason for Consultation/Follow-up: Establishing goals of care  Subjective: I spoke with wife Angie by phone as she is not coming to the hospital today since patient is receiving HD.  We reviewed our discussion from yesterday related to patient's symptom management needs. I reviewed patient's current medications and provided education that patient's symptoms were already being addressed. I let her know that trazodone had been increased from 50 to 100 mg at bedtime for sleep and antidepressant effect.   Plan of care discussed with [name] PA and primary RN.    Length of Stay: 8  Current Medications: Scheduled Meds:  . vitamin C  1,000 mg Oral Daily  . aspirin EC  81 mg Oral Daily  . atorvastatin  40 mg Oral Daily  . cinacalcet  30 mg Oral Weekly  . clopidogrel  75 mg Oral Daily  . darbepoetin ([name]) injection - DIALYSIS  200 mcg Intravenous Q Wed-HD  . docusate sodium  200 mg Oral Daily  . ferric citrate  420 mg Oral TID with meals  . heparin  5,000 Units Subcutaneous Q8H  . insulin aspart  0-6 Units Subcutaneous TID WC  . insulin glargine-yfgn  5 Units Subcutaneous QHS  . melatonin  3 mg Oral QHS  . midodrine  10 mg Oral TID WC  . multivitamin  1 tablet Oral QHS  . nutrition supplement (JUVEN)  1 packet Oral BID BM  . pantoprazole  40 mg Oral Daily  . senna  2 tablet Oral Daily  . traZODone  100 mg Oral QHS  . venlafaxine XR  37.5 mg Oral QPM     PRN Meds: acetaminophen, bisacodyl, calcium carbonate, diphenhydrAMINE, [START ON 08/12/2021]  heparin, HYDROmorphone (DILAUDID) injection, methocarbamol, ondansetron (ZOFRAN) IV, oxyCODONE, phenol, polyethylene glycol, prochlorperazine **OR** prochlorperazine **OR** prochlorperazine, simethicone, sorbitol, temazepam          LBM: Last BM Date: 08/08/21 Baseline Weight: Weight: 80.2 kg Most recent weight: Weight: 81 kg       Palliative Assessment/Data: PPS 40%        Palliative Care Assessment & Plan  HPI/Patient Profile: 65 y.o. male  with past medical history of renal cancer s/p nephrectomy, ESRD on HD MWF), CAD s/p CABG in 2021, HFpEF, and severe peripheral artery disease. He was hospitalized 05/07/21 - 05/18/21 with recurrent ulcer and worsening pain in his LLE.  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 He has had issues with wound dehiscence and was admitted on 07/27/21 for revision of left BKA by Dr. Sharol Given. Post operative course was complicated by hypotension, hypoxia, and volume overload as well as pain and depression symptoms. Due to functional decline, he was admitted to [name] on 08/03/2021.   Assessment: - severe PAD, s/p left BKA with revision - functional and mobility deficits secondary to above - ESRD on HD MWF - anemia - hypotension - constipation - anxiety/depression - nausea  Recommendations/Plan: Full code full scope Patient and wife are hopeful for improvement Outpatient palliative referral PMT will continue to follow  Symptom Management:  Continue - sennakot 2 tablets daily Continue - Oxycodone IR 5 mg every 4 hours as needed for pain Continue - Trazodone 100 mg daily at bedtime Continue - Venlafaxine XR 37.5 mg daily Continue - Ondansetron (Zofran) OR prochlorperazine (Compazine) prn for nausea  Goals of Care and Additional Recommendations: Limitations on Scope of Treatment: Full Scope Treatment   Prognosis:  Unable to determine  Discharge Planning: To Be Determined    Thank you for allowing the Palliative Medicine  Team to assist in the care of this patient.   Total Time 15 minutes Prolonged Time Billed  no       Greater than 50%  of this time was spent counseling and coordinating care related to the above assessment and plan.  Lavena Bullion, NP  Please contact Palliative Medicine Team phone at [phone] for questions and concerns.

## 2021-08-11 NOTE — Progress Notes (Signed)
Patient refused tap water enema.  Patient stated he didn't want it at bedtime.

## 2021-08-11 NOTE — Progress Notes (Signed)
PROGRESS NOTE   Subjective/Complaints: [name] still feeling constipated and nauseous but says that he is feeling better today Has ruminating thoughts that prevent him from sleeping well- has not benefited from Klonopin in the past.   ROS: denies pain currently, +nausea, +bilateral hand tremor, +constipation   Objective:   No results found. No results for input(s): WBC, HGB, HCT, PLT in the last 72 hours.  Recent Labs    08/10/21 0937  NA 134*  K 3.8  CL 95*  CO2 29  GLUCOSE 94  BUN 31*  CREATININE 3.97*  CALCIUM 9.0    Intake/Output Summary (Last 24 hours) at 08/11/2021 0846 Last data filed at 08/10/2021 1845 Gross per 24 hour  Intake 200 ml  Output --  Net 200 ml         Physical Exam: Vital Signs Blood pressure 115/70, pulse 91, temperature 97.8 F (36.6 C), temperature source Oral, resp. rate 16, height 5\' 11"  (1.803 m), weight 81 kg, SpO2 97 %. Gen: appears nauseous, weak HEENT: oral mucosa pink and moist, NCAT Cardio: Reg rate Chest: normal effort, normal rate of breathing Abd: soft, +distention, +nausea Ext: no edema Psych: pleasant, normal affect  Musculoskeletal:        General: Normal range of motion.     Cervical back: Normal range of motion.     Comments: Left knuckles with multiple scabs. Multiple bruises on BUE. L-BKA with compressive stocking and wound VAC in place.  Pes planus deformity right foot without skin breakdown.   Skin:    Coloration: Skin is pale.    Neurological:     Mental Status: He is alert and oriented to person, place, and time.     Comments: Normal language, no focal cn findings. UE grossly 4+/5. RLE 3+ prox to 4+/5 distally, LLE 2/5 proximally and limited by pain. Decreased sensation to PP and LT right foot. +bilateral hand tremor Psychiatric:     Comments: A little flat but generally cooperative      Assessment/Plan: 1. Functional deficits which require 3+  hours per day of interdisciplinary therapy in a comprehensive inpatient rehab setting. Physiatrist is providing close team supervision and 24 hour management of active medical problems listed below. Physiatrist and rehab team continue to assess barriers to discharge/monitor patient progress toward functional and medical goals  Care Tool:  Bathing    Body parts bathed by patient: Front perineal area, Right upper leg, Left upper leg   Body parts bathed by helper: Buttocks     Bathing assist Assist Level: Minimal Assistance - Patient > 75%     Upper Body Dressing/Undressing Upper body dressing   What is the patient wearing?: Pull over shirt    Upper body assist Assist Level: Minimal Assistance - Patient > 75%    Lower Body Dressing/Undressing Lower body dressing      What is the patient wearing?: Pants     Lower body assist Assist for lower body dressing: Maximal Assistance - Patient 25 - 49% (lateral leans)     Toileting Toileting    Toileting assist Assist for toileting: Contact Guard/Touching assist     Transfers Chair/bed transfer  Transfers assist  Chair/bed transfer assist level: Moderate Assistance - Patient 50 - 74% (slideboard)     Locomotion Ambulation   Ambulation assist   Ambulation activity did not occur: Safety/medical concerns (weakness, decreased balance, fear of falling)  Assist level: Moderate Assistance - Patient 50 - 74% Assistive device: Walker-rolling Max distance: 2'   Walk 10 feet activity   Assist  Walk 10 feet activity did not occur: Safety/medical concerns (weakness, decreased balance, fear of falling)        Walk 50 feet activity   Assist Walk 50 feet with 2 turns activity did not occur: Safety/medical concerns (weakness, decreased balance, fear of falling)         Walk 150 feet activity   Assist Walk 150 feet activity did not occur: Safety/medical concerns (weakness, decreased balance, fear of falling)          Walk 10 feet on uneven surface  activity   Assist Walk 10 feet on uneven surfaces activity did not occur: Safety/medical concerns (weakness, decreased balance, fear of falling)         Wheelchair     Assist Is the patient using a wheelchair?: Yes Type of Wheelchair: Manual    Wheelchair assist level: Supervision/Verbal cueing Max wheelchair distance: 44ft    Wheelchair 50 feet with 2 turns activity    Assist        Assist Level: Supervision/Verbal cueing   Wheelchair 150 feet activity     Assist      Assist Level: Maximal Assistance - Patient 25 - 49%   Blood pressure 115/70, pulse 91, temperature 97.8 F (36.6 C), temperature source Oral, resp. rate 16, height 5\' 11"  (1.803 m), weight 81 kg, SpO2 97 %.  Medical Problem List and Plan: 1.  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  Impaired mobility -DVT/anticoagulation:  Pharmaceutical: Heparin added             -antiplatelet therapy: Continue ASA/Plavix 3. Pain: Decrease Diluadid to 0.5mg  6H prn. Oxycodone prn.              d/c gabapentin given asterixis 4. Mood: LCSW to follow for evaluation and support.              -antipsychotic agents: N/A 5. Neuropsych: This patient is capable of making decisions on his own behalf. 6. Skin/Wound Care: Continue wound VAC for couple more days per ortho              --continue Vitamin C and Zinc with juven bid to promote wound healing.  7. Fluids/Electrolytes/Nutrition: Strict I/O. Routine check of labs with HD. 8. T2DM: Monitor BS ac/hs and use SSI for elevated BS             --Continue insulin Glargine with 2 units Novolog TID CBG (last 3)  Recent Labs    08/10/21 1641 08/10/21 2118 08/11/21 0651  GLUCAP 126* 129* 101*  Controlled, 10/6- continue above  regimen.   9. ESRD: HD MWF at the end of the day to help with tolerance of therapy.              --on Sensipar and ferric citrate for metabolic bone disease.   -  palliative care consulted as per family request Increase midodrine to 10mg  TID so he can tolerate more ultrafiltration 10. Anemia of chronic disease: Started on weekly [name] 09/28.              --routine H/H check with HD.  11. Hypotension: Increase midodrine to 10mg  TID so he can tolerate more ultrafiltration 12. Depression with acute reaction?: On Effexor with trazodone to help with sleep.              --Klonopin prn for anxiety.  13. CAD: On ASA/Plavix and Lipitor. Monitor for symptoms with increase in activity.  14.  PAD: Continue Trental TID.  15.  Constipation:  Increase colace to 200mg  daily. Tap water enema today 16. Nausea: Improved with resolution of constipation.  17. Bilateral hand tremor: unknown cause- ammonia normal. Appreciate nephrology and medicine following. D/c Gabapentin. Decrease oxycodone to 2.5mg  q4H prn 18. Decreased appetite: start megace- cleared with nephrology 19. Insomnia: increase trazodone to 100mg  HS.      LOS: 8 days A FACE TO FACE EVALUATION WAS PERFORMED  Maxwell Harmon 08/11/2021, 8:46 AM

## 2021-08-11 NOTE — Progress Notes (Addendum)
Pharmacy was consulted to evaluate for potential for drug induced asterixis. The classes of drug that is most often associated with asterixis are anticonvulsants such as phenytoin, carbamazapine, valproic acid, and gabapentin. Other classes include benzodiazepines and barbiturates.  He was on gabapentin PRN but has not received since 9/28 and it has been dced. He is not on other meds listed above.   Rx will sign off  Onnie Boer, PharmD, [place], AAHIVP, CPP Infectious Disease Pharmacist 08/11/2021 9:55 AM

## 2021-08-11 NOTE — Progress Notes (Signed)
Occupational Therapy Session Note  Patient Details  Name: Maxwell Harmon MRN: [id number] Date of Birth: 12-25-1955  Today's Date: 08/11/2021 OT Individual Time: 1016-1100 OT Individual Time Calculation (min): 44 min    Short Term Goals: Week 1:  OT Short Term Goal 1 (Week 1): Pt will perform LB dressing with MIN A OT Short Term Goal 2 (Week 1): Pt will complete MIN A stand > pivot transfer with LRAD to [facility] OT Short Term Goal 3 (Week 1): Pt will tolerate activity for 5 minutes w/o requiring external O2 support  Skilled Therapeutic Interventions/Progress Updates:  Skilled OT intervention completed with focus on BUE strengthening to increase independence with functional transfers. Pt received alert and seated in w/c, agreeable to therapy session. Pt reported feeling a little better this AM, presenting to be in better spirits. At w/c level, pt completed grooming tasks at sink with set up assist. Transitioned pt to portable O2, in w/c pt transported via therapist to gym. Pt participated in [place] with green theraband at w/c level, including the following shoulder exercises:  Bilateral horizontal abduction x10 Bicep flexion x10 each arm Shoulder extension x10 each arm Shoulder flexion x10 each arm  Pt required demonstrations and HOH cues at times, however pt presenting good form and positioning with slight limitations in LUE due to frozen shoulder. Pt benefited from conversation throughout exercises, to help keep pt alert vs nodding off. Returned pt back to room, with pt agreeable to staying in w/c for PT's arrival. Pt left seated in w/c, reconnected to wall O2, with chair alarm belt activated and all needs in reach at end of session.   Therapy Documentation Precautions:  Precautions Precautions: Fall Precaution Comments: wound vac Restrictions Weight Bearing Restrictions: Yes LLE Weight Bearing: Non weight bearing  Pain: Pt with no report of pain during  session.   Therapy/Group: Individual Therapy  Josselyne Onofrio E Aideen Fenster 08/11/2021, 12:25 PM

## 2021-08-11 NOTE — Progress Notes (Signed)
Physical Therapy Weekly Progress Note  Patient Details  Name: Maxwell Harmon MRN: [id number] Date of Birth: 03/27/1956  Beginning of progress report period: August 04, 2021 End of progress report period: August 11, 2021  Patient has met 0 of 3 short term goals. Pt demonstrates very slow progress towards long term goals. Pt has recently demonstrated decreased participation in therapy due to fatigue, nausea, anxiety, and decreased motivation and was made 15/7 on 10/3. Pt is able to transfer supine<>sitting EOB with min A, perform slideboard transfers with mod A, and squat<>pivot transfers with mod A +2. Pt continues to be limited by generalized weakness/deconditioning, decreased motivation, pain in L residual limb, fatigue, and tremors in BUE/LE.   Patient continues to demonstrate the following deficits muscle weakness and muscle joint tightness, decreased cardiorespiratoy endurance, decreased problem solving and decreased memory, and decreased sitting balance, decreased standing balance, decreased postural control, and decreased balance strategies and therefore will continue to benefit from skilled PT intervention to increase functional independence with mobility.  Patient progressing toward long term goals..  Continue plan of care.  PT Short Term Goals Week 1:  PT Short Term Goal 1 (Week 1): pt will transfer sit<>stand with LRAD and min A PT Short Term Goal 1 - Progress (Week 1): Progressing toward goal PT Short Term Goal 2 (Week 1): pt will transfer stand<>pivot with LRAD and min A PT Short Term Goal 2 - Progress (Week 1): Progressing toward goal PT Short Term Goal 3 (Week 1): pt will perform simulated car transfer with CGA PT Short Term Goal 3 - Progress (Week 1): Progressing toward goal Week 2:  PT Short Term Goal 1 (Week 2): pt will transfer sit<>stand with LRAD and min A PT Short Term Goal 2 (Week 2): pt will transfer stand<>pivot with LRAD and min A PT Short Term Goal 3 (Week 2): pt  will perform simulated car transfer with CGA  Skilled Therapeutic Interventions/Progress Updates:  Ambulation/gait training;Discharge planning;Functional mobility training;Psychosocial support;Therapeutic Activities;Balance/vestibular training;Disease management/prevention;Neuromuscular re-education;Skin care/wound management;Therapeutic Exercise;Wheelchair propulsion/positioning;DME/adaptive equipment instruction;Pain management;Splinting/orthotics;UE/LE Strength taining/ROM;Community reintegration;Patient/family education;Stair training;UE/LE Coordination activities   Therapy Documentation Precautions:  Precautions Precautions: Fall Precaution Comments: wound vac Restrictions Weight Bearing Restrictions: Yes LLE Weight Bearing: Non weight bearing  Therapy/Group: Individual Therapy Alfonse Alpers PT, DPT  08/11/2021, 7:25 AM

## 2021-08-11 NOTE — Progress Notes (Signed)
Occupational Therapy Weekly Progress Note  Patient Details  Name: NADAV SWINDELL MRN: [id number] Date of Birth: 1955/12/18  Beginning of progress report period: August 04, 2021 End of progress report period: August 11, 2021  Patient has met 0 of 3 short term goals.  Pt is making slow progress towards goals.  Pt with initial progress, however over the weekend and early in the week pt with increased tremors, decreased arousal, nausea and constipation impacting participation in therapy sessions.  Pt has required up to max +2 for squat pivot and sit > stand transfers due to decreased strength, nausea, and increased tremors.  Pt continues to require supplemental O2 during therapeutic activities.  Pt's family has encouraged consult with hospitalist and involvement of palliative care for goals of care.  On 10/7 pt with increased activity tolerance and engagement in therapy sessions.    Patient continues to demonstrate the following deficits: muscle weakness, decreased cardiorespiratoy endurance and decreased oxygen support, decreased coordination, and decreased sitting balance, decreased standing balance, decreased postural control, and decreased balance strategies and therefore will continue to benefit from skilled OT intervention to enhance overall performance with BADL and Reduce care partner burden.  Patient progressing toward long term goals..  Continue plan of care.  OT Short Term Goals Week 1:  OT Short Term Goal 1 (Week 1): Pt will perform LB dressing with MIN A OT Short Term Goal 1 - Progress (Week 1): Progressing toward goal OT Short Term Goal 2 (Week 1): Pt will complete MIN A stand > pivot transfer with LRAD to [facility] OT Short Term Goal 2 - Progress (Week 1): Revised due to lack of progress OT Short Term Goal 3 (Week 1): Pt will tolerate activity for 5 minutes w/o requiring external O2 support OT Short Term Goal 3 - Progress (Week 1): Progressing toward goal Week 2:  OT Short Term Goal 1  (Week 2): Pt will perform LB dressing with MIN A OT Short Term Goal 2 (Week 2): Pt will complete MIN A squat pivot transfer to [facility] OT Short Term Goal 3 (Week 2): Pt will engage in self-care tasks for 5 minutes w/o requiring external O2 support OT Short Term Goal 4 (Week 2): Pt will complete sit > stand with min assist to decrease burden of care with LB dressing    Ruby Logiudice, [facility] 08/11/2021, 9:56 AM

## 2021-08-12 DIAGNOSIS — K5901 Slow transit constipation: Secondary | ICD-10-CM

## 2021-08-12 DIAGNOSIS — E119 Type 2 diabetes mellitus without complications: Secondary | ICD-10-CM

## 2021-08-12 LAB — GLUCOSE, CAPILLARY
Glucose-Capillary: 69 mg/dL — ABNORMAL LOW (ref 70–99)
Glucose-Capillary: 69 mg/dL — ABNORMAL LOW (ref 70–99)
Glucose-Capillary: 94 mg/dL (ref 70–99)
Glucose-Capillary: 114 mg/dL — ABNORMAL HIGH (ref 70–99)
Glucose-Capillary: 121 mg/dL — ABNORMAL HIGH (ref 70–99)
Glucose-Capillary: 128 mg/dL — ABNORMAL HIGH (ref 70–99)

## 2021-08-12 LAB — T4, FREE: Free T4: 0.88 ng/dL (ref 0.61–1.12)

## 2021-08-12 MED ORDER — SORBITOL 70 % SOLN
60.0000 mL | Status: AC
  Administered 2021-08-12: 60 mL via ORAL
  Filled 2021-08-12: qty 60

## 2021-08-12 MED ORDER — HEPARIN SODIUM (PORCINE) 5000 UNIT/ML IJ SOLN
5000.0000 [IU] | Freq: Every day | INTRAMUSCULAR | Status: DC
  Administered 2021-08-13: 5000 [IU] via SUBCUTANEOUS
  Filled 2021-08-12: qty 1

## 2021-08-12 NOTE — Progress Notes (Addendum)
Occupational Therapy Session Note  Patient Details  Name: Maxwell Harmon MRN: [id number] Date of Birth: [birth year]  Today's Date: 08/12/2021 OT Individual Time: 0800-0900 OT Individual Time Calculation (min): 60 min    Short Term Goals: Week 1:  OT Short Term Goal 1 (Week 1): Pt will perform LB dressing with MIN A OT Short Term Goal 1 - Progress (Week 1): Progressing toward goal OT Short Term Goal 2 (Week 1): Pt will complete MIN A stand > pivot transfer with LRAD to [facility] OT Short Term Goal 2 - Progress (Week 1): Revised due to lack of progress OT Short Term Goal 3 (Week 1): Pt will tolerate activity for 5 minutes w/o requiring external O2 support OT Short Term Goal 3 - Progress (Week 1): Progressing toward goal  Skilled Therapeutic Interventions/Progress Updates:  Patient in bed lying crouched down on bed pan.   He asked this clinician for repositioning assist to sit more upright.   He was assisted with pillows but due to air bed and slick nature of mattress he and clinician were able to position much higher on the bed.   Otherwise, he asked for extra time to try to eliminate as he stated he had been administered an enema earlier in the morning.   He stated he was not able to have a BM and asked this clinician to assist him with bed pan removal.    Otherwise, he participated in OT as follows:   Bed mobility = extra time and minimal assistance and cues for technical for rolling with leverage and education on how to use his own strength to ease caregiver burder and increase his independence.  Lower body bathing and dressing while sitting reclined upright in bed after bed pan removal = moderate assistance with patient exhibiting SOB, rest breaks and decreased trunk strength and stabliity for trunk flexion to reach  bilateral feet.  He tended to roll sideways when flexing forward    Supine to edge of bed= extra time and moderate assistance and moderate verbal cues for technqiue to increase  safety, strength, and independence.  Sitting edge of bed patient tended to hold his right foot off the floor and was not aware his foot was not touching the floor.   He stated he was ready to transfer into his w/c; however, he required reminders and cueing x3 for placing his right floor onto floor rather than maintained bent at the knee with foot off the floor.  Edge of bed to wheelchair transfer to client's right = moderate assistance +2.   Once patient placed and kept his  right foot on the floor, he completed the transfer with extra time.    Client completed grooming of hair and oral care at sink with setup.  He stated he would wash his upper body later and change his shirt due to scheduled time frame of session ended.  He was left seated in his wheelchair with safety belt engaged, beside his bed, and with call bell and phone on bedside table next to him.  During session patient O2 dropped to 85 after 2 minutes during treatment participation.   It was immediately donned, and he was reminded to complete deep breaths and inhale his oxygen even though he stated prior to admission he had always "breathed through mouth."  Continue OT Plan of Care  Therapy Documentation Precautions:  Precautions Precautions: Fall Precaution Comments: wound vac Restrictions Weight Bearing Restrictions: (P) Yes LLE Weight Bearing: (P) Non weight bearing  Pain: 4/10 in surgical site but denied need for medication    Therapy/Group: Individual Therapy  Alfredia Ferguson [facility] 08/12/2021, 3:55 PM

## 2021-08-12 NOTE — Progress Notes (Signed)
Physical Therapy Session Note  Patient Details  Name: Maxwell Harmon MRN: [id number] Date of Birth: Mar 28, 1956  Today's Date: 08/12/2021 PT Individual Time: 1122-1200 and 6759-1638 PT Individual Time Calculation (min): 38 min and 39   Short Term Goals:  Week 2:  PT Short Term Goal 1 (Week 2): pt will transfer sit<>stand with LRAD and min A PT Short Term Goal 2 (Week 2): pt will transfer stand<>pivot with LRAD and min A PT Short Term Goal 3 (Week 2): pt will perform simulated car transfer with CGA   Skilled Therapeutic Interventions/Progress Updates:  Session 1.   Pt received sitting in WC and agreeable to PT. WC mobility x 136f with supervision assist and cues for improved ROM in Bil shoulders.   Sit<>stand and standing tolerance in parallel bars performed x 3 with 10 sec in standing. Mod assist to attain standing and then min assist to maintain standing balance.  Kinetron RLE only 3 x 1.5 min  with cues for full ROM and improved force to maximize strengthening aspect of movement.   [facility] and all needs met.      Session 2.   Pt received sitting in WC and agreeable to PT.  SB transfer to bed to bed with min assist and moderate cues for sequencing as well as improved UE placement.  Sit>supine with min assist for RLE postioning. P[t noted to have SOB once in supine and was required to be partially elevated to prevent SOB.   Supine therex.  SLR x 8 .  Heel slide RLE x 10 .  SAQ x 10 .  Hip abduction/adduction  x 10  Ankle PF/DF x 15 RLE  Cues for improved pursed lip breathing and increased ROM as tolerated without pain.   Pt left supine in bed with call bell in reach with all needs met.          Therapy Documentation Precautions:  Precautions Precautions: Fall Precaution Comments: wound vac Restrictions Weight Bearing Restrictions: (P) Yes LLE Weight Bearing: (P) Non weight bearing    Vital  Signs: Therapy Vitals Temp: 97.6 F (36.4 C) Temp Source: Oral Pulse Rate: 85 Resp: 17 BP: 128/69 Patient Position (if appropriate): Sitting Oxygen Therapy SpO2: 100 % Pain: Session 1. Denies  Session 2. Denies    Therapy/Group: Individual Therapy  ALorie Phenix10/06/2021, 3:34 PM

## 2021-08-12 NOTE — Progress Notes (Addendum)
PROGRESS NOTE   Subjective/Complaints: Pt reports a hard time settling himself down at night. Partly d/t pain. Still constipated  ROS: Patient denies fever, rash, sore throat, blurred vision, nausea, vomiting, diarrhea, cough, shortness of breath or chest pain,   headache, or mood change.    Objective:   No results found. Recent Labs    08/11/21 1509  WBC 6.2  HGB 9.0*  HCT 30.2*  PLT 146*    Recent Labs    08/10/21 0937 08/11/21 1509  NA 134* 130*  K 3.8 3.8  CL 95* 93*  CO2 29 27  GLUCOSE 94 104*  BUN 31* 43*  CREATININE 3.97* 5.03*  CALCIUM 9.0 9.0    Intake/Output Summary (Last 24 hours) at 08/12/2021 0917 Last data filed at 08/12/2021 0848 Gross per 24 hour  Intake 560 ml  Output 2000 ml  Net -1440 ml         Physical Exam: Vital Signs Blood pressure 120/63, pulse 94, temperature 98.2 F (36.8 C), temperature source Oral, resp. rate 18, height 5\' 11"  (1.803 m), weight 123 kg, SpO2 97 %. Constitutional: No distress . Vital signs reviewed. HEENT: NCAT, EOMI, oral membranes moist Neck: supple Cardiovascular: RRR without murmur. No JVD    Respiratory/Chest: CTA Bilaterally without wheezes or rales. Normal effort    GI/Abdomen: BS +, non-tender, non-distended Ext: no clubbing, cyanosis, or edema Psych: pleasant and cooperative, a little flat Musculoskeletal:        General: Normal range of motion.     Cervical back: Normal range of motion.     Comments: Left knuckles with multiple scabs. Multiple bruises on BUE. L-BKA as below.  Pes planus deformity right foot without skin breakdown.   Skin:    Coloration: Skin is pale.    Neurological:     Mental Status: He is alert and oriented to person, place, and time.     Comments: Normal language, no focal cn findings. UE grossly 4+/5. RLE 3+ prox to 4+/5 distally, LLE 2/5 proximally and limited by pain. Decreased sensation to PP and LT right foot.  +bilateral hand tremor        Assessment/Plan: 1. Functional deficits which require 3+ hours per day of interdisciplinary therapy in a comprehensive inpatient rehab setting. Physiatrist is providing close team supervision and 24 hour management of active medical problems listed below. Physiatrist and rehab team continue to assess barriers to discharge/monitor patient progress toward functional and medical goals  Care Tool:  Bathing    Body parts bathed by patient: Front perineal area, Right upper leg, Left upper leg   Body parts bathed by helper: Buttocks     Bathing assist Assist Level: Minimal Assistance - Patient > 75%     Upper Body Dressing/Undressing Upper body dressing   What is the patient wearing?: Pull over shirt    Upper body assist Assist Level: Minimal Assistance - Patient > 75%    Lower Body Dressing/Undressing Lower body dressing      What is the patient wearing?: Pants     Lower body assist Assist for lower body dressing: Maximal Assistance - Patient 25 - 49% (lateral leans)     Toileting  Toileting    Toileting assist Assist for toileting: Contact Guard/Touching assist     Transfers Chair/bed transfer  Transfers assist     Chair/bed transfer assist level: Moderate Assistance - Patient 50 - 74% (slideboard)     Locomotion Ambulation   Ambulation assist   Ambulation activity did not occur: Safety/medical concerns (weakness, decreased balance, fear of falling)  Assist level: Moderate Assistance - Patient 50 - 74% Assistive device: Walker-rolling Max distance: 2'   Walk 10 feet activity   Assist  Walk 10 feet activity did not occur: Safety/medical concerns (weakness, decreased balance, fear of falling)        Walk 50 feet activity   Assist Walk 50 feet with 2 turns activity did not occur: Safety/medical concerns (weakness, decreased balance, fear of falling)         Walk 150 feet activity   Assist Walk 150 feet activity  did not occur: Safety/medical concerns (weakness, decreased balance, fear of falling)         Walk 10 feet on uneven surface  activity   Assist Walk 10 feet on uneven surfaces activity did not occur: Safety/medical concerns (weakness, decreased balance, fear of falling)         Wheelchair     Assist Is the patient using a wheelchair?: Yes Type of Wheelchair: Manual    Wheelchair assist level: Supervision/Verbal cueing Max wheelchair distance: 49ft    Wheelchair 50 feet with 2 turns activity    Assist        Assist Level: Supervision/Verbal cueing   Wheelchair 150 feet activity     Assist      Assist Level: Maximal Assistance - Patient 25 - 49%   Blood pressure 120/63, pulse 94, temperature 98.2 F (36.8 C), temperature source Oral, resp. rate 18, height 5\' 11"  (1.803 m), weight 123 kg, SpO2 97 %.  Medical Problem List and Plan: 1.  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  Impaired mobility -DVT/anticoagulation:  Pharmaceutical: Heparin added             -antiplatelet therapy: Continue ASA/Plavix 3. Pain: Decrease Diluadid to 0.5mg  6H prn. Oxycodone prn.              d/c'ed gabapentin given asterixis 4. Mood: LCSW to follow for evaluation and support.              -antipsychotic agents: N/A 5. Neuropsych: This patient is capable of making decisions on his own behalf. 6. Skin/Wound Care: Continue wound VAC for couple more days per ortho              --continue Vitamin C and Zinc with juven bid to promote wound healing.  7. Fluids/Electrolytes/Nutrition: Strict I/O. Routine check of labs with HD. 8. T2DM: Monitor BS ac/hs and use SSI for elevated BS             --Continue insulin Glargine with 2 units Novolog TID CBG (last 3)  Recent  Labs    08/12/21 0634 08/12/21 0729 08/12/21 0800  GLUCAP 69* 69* 94  Cbg's a little too low10/8- will hold semglee for now given sub-optimal po intake, novolog already stopped  9. ESRD: HD MWF at the end of the day to help with tolerance of therapy.              --on Sensipar and ferric citrate for metabolic bone disease.   -palliative care consulted as per family request Increased midodrine to 10mg  TID so he can tolerate more ultrafiltration 10. Anemia of chronic disease: Started on weekly [name] 09/28.              --routine H/H check with HD.  11. Hypotension: Increase midodrine to 10mg  TID so he can tolerate more ultrafiltration 12. Depression with acute reaction?: On Effexor with trazodone to help with sleep.              --Klonopin prn for anxiety.  13. CAD: On ASA/Plavix and Lipitor. Monitor for symptoms with increase in activity.  14.  PAD: Continue Trental TID.  15.  Constipation:    -10/8--persistent--sorbitol and SSE later today if needed 16. Nausea: Improved with resolution of constipation.  17. Bilateral hand tremor: unknown cause- ammonia normal. Appreciate nephrology and medicine following. D/c Gabapentin. Decrease oxycodone to 2.5mg  q4H prn 18. Decreased appetite: start megace- cleared with nephrology 19. Insomnia: increased trazodone to 100mg  HS---observe  -advised pt to put on some music, back ground noise to help with falling asleep      LOS: 9 days A FACE TO FACE EVALUATION WAS PERFORMED  Meredith Staggers 08/12/2021, 9:17 AM

## 2021-08-12 NOTE — Progress Notes (Signed)
Hypoglycemic Event  CBG: 69 at 0729  Treatment: 4 oz juice/soda pt finished breakfast  Symptoms: None  Follow-up CBG: Time:0800 CBG Result:94   Possible Reasons for Event: Unknown  Comments/MD notified: protocol followed    Sandria Senter Tamelia Michalowski

## 2021-08-12 NOTE — Progress Notes (Signed)
Hypoglycemic Event  CBG: 69  Treatment: 8 oz juice/soda  Symptoms: None  Follow-up CBG: Time:0720 CBG Result:69  Possible Reasons for Event: Unknown  Comments/MD notified:Patient given breakfast after first CBG.  When checking CBG again it still read 69.  Orange juice given and [place], RN will follow up.    Maxwell Harmon

## 2021-08-13 LAB — GLUCOSE, CAPILLARY
Glucose-Capillary: 110 mg/dL — ABNORMAL HIGH (ref 70–99)
Glucose-Capillary: 162 mg/dL — ABNORMAL HIGH (ref 70–99)
Glucose-Capillary: 138 mg/dL — ABNORMAL HIGH (ref 70–99)
Glucose-Capillary: 139 mg/dL — ABNORMAL HIGH (ref 70–99)

## 2021-08-13 MED ORDER — SORBITOL 70 % SOLN
60.0000 mL | Status: AC
  Administered 2021-08-13: 60 mL via ORAL
  Filled 2021-08-13: qty 60

## 2021-08-13 MED ORDER — SORBITOL 70 % SOLN
960.0000 mL | TOPICAL_OIL | Freq: Once | ORAL | Status: AC
  Administered 2021-08-13: 960 mL via RECTAL
  Filled 2021-08-13: qty 473

## 2021-08-13 MED ORDER — GUAIFENESIN-DM 100-10 MG/5ML PO SYRP
5.0000 mL | ORAL_SOLUTION | ORAL | Status: DC | PRN
  Administered 2021-08-13: 5 mL via ORAL
  Filled 2021-08-13: qty 10

## 2021-08-13 MED ORDER — TRAZODONE HCL 50 MG PO TABS
50.0000 mg | ORAL_TABLET | Freq: Every day | ORAL | Status: DC
  Administered 2021-08-14 – 2021-08-15 (×2): 50 mg via ORAL
  Filled 2021-08-13 (×2): qty 1

## 2021-08-13 NOTE — Progress Notes (Signed)
Physical Therapy Session Note  Patient Details  Name: Maxwell Harmon MRN: [id number] Date of Birth: Aug 18, 1956  Today's Date: 08/13/2021 PT Missed Time: 41 Minutes Missed Time Reason: Patient fatigue  Short Term Goals: Week 2:  PT Short Term Goal 1 (Week 2): pt will transfer sit<>stand with LRAD and min A PT Short Term Goal 2 (Week 2): pt will transfer stand<>pivot with LRAD and min A PT Short Term Goal 3 (Week 2): pt will perform simulated car transfer with CGA  Skilled Therapeutic Interventions/Progress Updates:     Pt received supine in bed. Wife reports pt had been up in chair all day and had just gotten back to bed. Pt requesting to rest at this time. PT will follow up as able.  Therapy Documentation Precautions:  Precautions Precautions: Fall Precaution Comments: wound vac Restrictions Weight Bearing Restrictions: Yes LLE Weight Bearing: Non weight bearing   Therapy/Group: Individual Therapy  Breck Coons, PT, DPT 08/13/2021, 3:34 PM

## 2021-08-13 NOTE — Progress Notes (Signed)
  Discussed with rehab MD this morning- at this time asterixis has resolved and no concern- TRH will sign off. I had visited patient yesterday- he had no tremors/asterixis and had discussed with family members at the bedside. This am followed up labs- free T4 normal, although tsh is some up- suggest checking TSH in 4 weeks, B12 nl.TRH will be available if further assistance needed-please call us for any concerns . Continue current plan of care as per PMR MD, nephrology, PMT.

## 2021-08-13 NOTE — Progress Notes (Signed)
Physical Therapy Session Note  Patient Details  Name: Maxwell Harmon MRN: [id number] Date of Birth: 06-Jun-1956  Today's Date: 08/13/2021 PT Individual Time: 1045-1150 PT Individual Time Calculation (min): 65 min   Short Term Goals: Week 2:  PT Short Term Goal 1 (Week 2): pt will transfer sit<>stand with LRAD and min A PT Short Term Goal 2 (Week 2): pt will transfer stand<>pivot with LRAD and min A PT Short Term Goal 3 (Week 2): pt will perform simulated car transfer with CGA  Skilled Therapeutic Interventions/Progress Updates: Pt presented in w/c with wife present agreeable to therapy. Pt denies pain at present but very lethargic throughout session with pt falling asleep multiple times. Pt agreeable to spend some time outside for mood enhancement as well as hopefully increasing arousal. Pt transported to [facility] entrance with wife and participated in [name], hip flexion, and hip abd/add with 2lb cuff. Several times pt would begin to fall asleep while performing activity requiring encouragement from PTA and wife to stay awake. Provided encouragement to wife regarding pt's continuing efforts to build strength/endurance to get back to baseline. Pt denied SOB while outside and appeared in no distress. Pt then transported back inside and pt propelled from [facility] entrance to [employer] with supervision but increased time/effort noted with pt frequently drifting to L requiring cues for correction. Pt took brief break and had coffee that wife obtained for pt which did seem to arouse pt for a short bout. Pt continued w/c propulsion to gift shop then PTA transported to elevators due to fatigue. Once back in unit pt transported to rehab gym and participated in hamstring pulls and R hip ER with level 2 resistance band. Pt then stating SOB with SpO2 checked 89-97% on RA. PTA and wife noted that when pt more alert higher SpO2. Discussed continued use of incentive spirometer with wife stating only uses occasionally. Pt  transported back to room and had pt perform IS x 10 with pt averaging ~253m but unable to reach 5059m Pt left in w/c at end of session with wife present and current needs met.      Therapy Documentation Precautions:  Precautions Precautions: Fall Precaution Comments: wound vac Restrictions Weight Bearing Restrictions: Yes LLE Weight Bearing: Non weight bearing General: PT Amount of Missed Time (min): 10 Minutes PT Missed Treatment Reason: Patient fatigue Vital Signs:   Pain: Pain Assessment Pain Scale: 0-10 Pain Score: 0-No pain   Therapy/Group: Individual Therapy  Estel Scholze Miu Chiong, PTA  08/13/2021, 12:26 PM

## 2021-08-13 NOTE — Plan of Care (Signed)
  Problem: Consults Goal: RH LIMB LOSS PATIENT EDUCATION Description: Description: See Patient Education module for eduction specifics. Outcome: Progressing Goal: Skin Care Protocol Initiated - if Braden Score 18 or less Description: If consults are not indicated, leave blank or document N/A Outcome: Progressing Goal: Diabetes Guidelines if Diabetic/Glucose > 140 Description: If diabetic or lab glucose is > 140 mg/dl - Initiate Diabetes/Hyperglycemia Guidelines & Document Interventions  Outcome: Progressing   Problem: RH BOWEL ELIMINATION Goal: RH STG MANAGE BOWEL WITH ASSISTANCE Description: STG Manage Bowel with Min Assistance. Outcome: Progressing   Problem: RH BLADDER ELIMINATION Goal: RH STG MANAGE BLADDER WITH ASSISTANCE Description: STG Manage Bladder With Min Assistance Outcome: Progressing   Problem: RH SKIN INTEGRITY Goal: RH STG MAINTAIN SKIN INTEGRITY WITH ASSISTANCE Description: STG Maintain Skin Integrity With [place]. Outcome: Progressing Goal: RH STG ABLE TO PERFORM INCISION/WOUND CARE W/ASSISTANCE Description: STG Able To Perform Incision/Wound Care With [employer]. Outcome: Progressing   Problem: RH SAFETY Goal: RH STG ADHERE TO SAFETY PRECAUTIONS W/ASSISTANCE/DEVICE Description: STG Adhere to Safety Precautions With Min Assistance/Device. Outcome: Progressing Goal: RH STG DECREASED RISK OF FALL WITH ASSISTANCE Description: STG Decreased Risk of Fall With [employer]. Outcome: Progressing   Problem: RH PAIN MANAGEMENT Goal: RH STG PAIN MANAGED AT OR BELOW PT'S PAIN GOAL Description: < 3 on a 0-10 pain scale. Outcome: Progressing   Problem: RH KNOWLEDGE DEFICIT LIMB LOSS Goal: RH STG INCREASE KNOWLEDGE OF SELF CARE AFTER LIMB LOSS Description: Patient will demonstrate knowledge of medication management, pain management, skin/wound care, and residual limb care with educational materials and handouts provided by staff independently at  discharge. Outcome: Progressing

## 2021-08-13 NOTE — Progress Notes (Signed)
Pt disimpacted and SMOG enema administered at 1700. Pt able to pass large amount of hard lumps of brown and dark stool. Pt tolerated well. Pt's spouse at bedside and supportive of care. Pt currently resting in bed with all needs within reach.   Gerald Stabs, RN

## 2021-08-13 NOTE — Progress Notes (Signed)
Pt and family have refused the enema at bedtime. They have agreed to allow it to be administered in the AM.

## 2021-08-13 NOTE — Progress Notes (Addendum)
Patient ID: Maxwell Harmon, male   DOB: 1956-06-17, 65 y.o.   MRN: [id number]  [facility] Progress Note   Assessment/ Plan:   1.  Status post revision of left below-knee amputation: With intermittent breakthrough pain ongoing management as directed by orthopedic surgery.  Admitted to inpatient rehabilitation unit. 2. ESRD: on HD MWF. HD Monday.   3. Anemia: Hemoglobin 9's, resumed [name]200 ug weekly while here on Wed's. 4. CKD-MBD: Calcium level acceptable with improving phosphorus levels on [place]. 5. Nutrition: Ongoing renal diet with protein supplementation and renal multivitamin.  Fluid restriction. 6. Hypertension/volume excess: With episodes of intradialytic hypotension-on midodrine 10 mg twice daily. Has dependent edema which has not been effected by HD for the most part.  7. Asterixis - not uremic and NH3 is wnl. Avoid gabapentin/ lyrica and excess narcotics as these can cause asterixis in ESRD pts. Oxycodone is being tapered down.    Kelly Splinter, MD 08/13/2021, 12:55 PM    Subjective:   No c/o today   Objective:   BP 128/74 (BP Location: Right Arm)   Pulse 95   Temp 98.5 F (36.9 C) (Oral)   Resp 18   Ht 5\' 11"  (1.803 m)   Wt 124 kg Comment: bed needs to be zeroed out to verify weight.  SpO2 96%   BMI 38.13 kg/m   Physical Exam: Gen: no distress, Ox 3 CVS: Pulse regular rhythm, normal rate, S1 and S2 normal Resp: Clear to auscultation bilaterally, no rales/rhonchi Abd: Soft, obese, nontender, bowel sounds normal Ext: Left BKA with shrinker, bilat 1+ hip/ dependent edema  LUA AVF+ bruit Neuro: nonfocal, Ox 3, mild asterixis  Labs: BMET Recent Labs  Lab 08/07/21 1256 08/10/21 0937 08/11/21 1509  NA 128* 134* 130*  K 4.4 3.8 3.8  CL 91* 95* 93*  CO2 27 29 27   GLUCOSE 150* 94 104*  BUN 73* 31* 43*  CREATININE 6.96* 3.97* 5.03*  CALCIUM 8.6* 9.0 9.0  PHOS 4.8*  --  3.1    CBC Recent Labs  Lab 08/07/21 1256 08/11/21 1509  WBC 6.7  6.2  HGB 9.0* 9.0*  HCT 30.3* 30.2*  MCV 104.8* 106.7*  PLT 152 146*       Medications:     vitamin C  1,000 mg Oral Daily   aspirin EC  81 mg Oral Daily   atorvastatin  40 mg Oral Daily   cinacalcet  30 mg Oral Weekly   clopidogrel  75 mg Oral Daily   darbepoetin ([name]) injection - DIALYSIS  200 mcg Intravenous Q Wed-HD   docusate sodium  200 mg Oral Daily   ferric citrate  420 mg Oral TID with meals   heparin  5,000 Units Subcutaneous Q0600   insulin aspart  0-6 Units Subcutaneous TID WC   melatonin  3 mg Oral QHS   midodrine  10 mg Oral TID WC   multivitamin  1 tablet Oral QHS   nutrition supplement (JUVEN)  1 packet Oral BID BM   pantoprazole  40 mg Oral Daily   senna  2 tablet Oral Daily   sorbitol, milk of mag, mineral oil, glycerin (SMOG) enema  960 mL Rectal Once   traZODone  100 mg Oral QHS   venlafaxine XR  37.5 mg Oral QPM

## 2021-08-13 NOTE — Progress Notes (Signed)
PROGRESS NOTE   Subjective/Complaints: Pt slept better last night. Wife stayed over which helped. Still no results with bowels. Receiving SSE this morning (didn't retain it yesterday). PO intake fair yesterday  ROS: Limited due to patient going in and out of sleep. Mostly thru his wife    Objective:   No results found. Recent Labs    08/11/21 1509  WBC 6.2  HGB 9.0*  HCT 30.2*  PLT 146*    Recent Labs    08/10/21 0937 08/11/21 1509  NA 134* 130*  K 3.8 3.8  CL 95* 93*  CO2 29 27  GLUCOSE 94 104*  BUN 31* 43*  CREATININE 3.97* 5.03*  CALCIUM 9.0 9.0    Intake/Output Summary (Last 24 hours) at 08/13/2021 0803 Last data filed at 08/13/2021 0400 Gross per 24 hour  Intake 840 ml  Output 0 ml  Net 840 ml         Physical Exam: Vital Signs Blood pressure 128/74, pulse 95, temperature 98.5 F (36.9 C), temperature source Oral, resp. rate 18, height 5\' 11"  (1.803 m), weight 124 kg, SpO2 96 %. Constitutional: No distress . Vital signs reviewed. HEENT: NCAT, EOMI, oral membranes moist Neck: supple Cardiovascular: RRR without murmur. No JVD    Respiratory/Chest: CTA Bilaterally without wheezes or rales. Normal effort    GI/Abdomen: BS +, non-tender, non-distended Ext: no clubbing, cyanosis, or edema Psych: sleepy this morning Musculoskeletal:        General: Normal range of motion.     Cervical back: Normal range of motion.     Comments: Left knuckles with multiple scabs. Multiple bruises on BUE. L-BKA as below.  Pes planus deformity right foot without skin breakdown.   Skin:    Coloration: Skin is pale.    Neurological:     Mental Status: awakens and follows commands.     Comments: Normal language, no focal cn findings. UE grossly 4+/5. RLE 3+ prox to 4+/5 distally, LLE 2/5 proximally and limited by pain. Decreased sensation to PP and LT right foot. +bilateral hand tremor        Assessment/Plan: 1.  Functional deficits which require 3+ hours per day of interdisciplinary therapy in a comprehensive inpatient rehab setting. Physiatrist is providing close team supervision and 24 hour management of active medical problems listed below. Physiatrist and rehab team continue to assess barriers to discharge/monitor patient progress toward functional and medical goals  Care Tool:  Bathing    Body parts bathed by patient: Front perineal area, Right upper leg, Left upper leg   Body parts bathed by helper: Buttocks     Bathing assist Assist Level: Minimal Assistance - Patient > 75%     Upper Body Dressing/Undressing Upper body dressing   What is the patient wearing?: Pull over shirt    Upper body assist Assist Level: Minimal Assistance - Patient > 75%    Lower Body Dressing/Undressing Lower body dressing      What is the patient wearing?: Pants     Lower body assist Assist for lower body dressing: Maximal Assistance - Patient 25 - 49% (lateral leans)     Toileting Toileting    Toileting assist Assist  for toileting: Contact Guard/Touching assist     Transfers Chair/bed transfer  Transfers assist     Chair/bed transfer assist level: Moderate Assistance - Patient 50 - 74% (slideboard)     Locomotion Ambulation   Ambulation assist   Ambulation activity did not occur: Safety/medical concerns (weakness, decreased balance, fear of falling)  Assist level: Moderate Assistance - Patient 50 - 74% Assistive device: Walker-rolling Max distance: 2'   Walk 10 feet activity   Assist  Walk 10 feet activity did not occur: Safety/medical concerns (weakness, decreased balance, fear of falling)        Walk 50 feet activity   Assist Walk 50 feet with 2 turns activity did not occur: Safety/medical concerns (weakness, decreased balance, fear of falling)         Walk 150 feet activity   Assist Walk 150 feet activity did not occur: Safety/medical concerns (weakness,  decreased balance, fear of falling)         Walk 10 feet on uneven surface  activity   Assist Walk 10 feet on uneven surfaces activity did not occur: Safety/medical concerns (weakness, decreased balance, fear of falling)         Wheelchair     Assist Is the patient using a wheelchair?: Yes Type of Wheelchair: Manual    Wheelchair assist level: Supervision/Verbal cueing Max wheelchair distance: 72ft    Wheelchair 50 feet with 2 turns activity    Assist        Assist Level: Supervision/Verbal cueing   Wheelchair 150 feet activity     Assist      Assist Level: Maximal Assistance - Patient 25 - 49%   Blood pressure 128/74, pulse 95, temperature 98.5 F (36.9 C), temperature source Oral, resp. rate 18, height 5\' 11"  (1.803 m), weight 124 kg, SpO2 96 %.  Medical Problem List and Plan: 1.  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  Impaired mobility -DVT/anticoagulation:  Pharmaceutical: Heparin added             -antiplatelet therapy: Continue ASA/Plavix 3. Pain: Decrease Diluadid to 0.5mg  6H prn. Oxycodone prn.              d/c'ed gabapentin given asterixis 4. Mood: LCSW to follow for evaluation and support.              -antipsychotic agents: N/A 5. Neuropsych: This patient is capable of making decisions on his own behalf. 6. Skin/Wound Care: Continue wound VAC for couple more days per ortho              --continue Vitamin C and Zinc with juven bid to promote wound healing.  7. Fluids/Electrolytes/Nutrition: Strict I/O. Routine check of labs with HD. 8. T2DM: Monitor BS ac/hs and use SSI for elevated BS             --Continue insulin Glargine with 2 units Novolog TID CBG (last 3)  Recent Labs    08/12/21 1634 08/12/21 2114  08/13/21 0614  GLUCAP 121* 128* 110*  Cbg's controlled -10/9: continue to hold semglee for now given sub-optimal po intake, novolog already stopped  9. ESRD: HD MWF at the end  of the day to help with tolerance of therapy.              --on Sensipar and ferric citrate for metabolic bone disease.   -palliative care consulted as per family request Increased midodrine to 10mg  TID so he can tolerate more ultrafiltration 10. Anemia of chronic disease: Started on weekly [name] 09/28.              --routine H/H check with HD.  11. Hypotension: Increase midodrine to 10mg  TID so he can tolerate more ultrafiltration 12. Depression with acute reaction?: On Effexor with trazodone to help with sleep.              --Klonopin prn for anxiety.   -wife here for support  -team providing egosupport as well 13. CAD: On ASA/Plavix and Lipitor. Monitor for symptoms with increase in activity.  14.  PAD: Continue Trental TID.  15.  Constipation:    -10/9-no results yet with--sorbitol and SSE (receiving SSE this AM)   -inconsistent intake not helping   -pending results with SSE, repeat sorbitol today with SMOG enema if needed  16. Nausea: Improved at present.  17. Bilateral hand tremor: unknown cause- ammonia normal. Appreciate nephrology and medicine following. D/c Gabapentin. Decrease oxycodone to 2.5mg  q4H prn 18. Decreased appetite: start megace- cleared with nephrology 19. Insomnia: increased trazodone to 100mg  HS---seems to have helped  -advised pt to put on some music, back ground noise to help with falling asleep  -wife staying over helpful also      LOS: 10 days A FACE TO FACE EVALUATION WAS PERFORMED  Meredith Staggers 08/13/2021, 8:03 AM

## 2021-08-14 LAB — GLUCOSE, CAPILLARY
Glucose-Capillary: 121 mg/dL — ABNORMAL HIGH (ref 70–99)
Glucose-Capillary: 130 mg/dL — ABNORMAL HIGH (ref 70–99)
Glucose-Capillary: 131 mg/dL — ABNORMAL HIGH (ref 70–99)
Glucose-Capillary: 138 mg/dL — ABNORMAL HIGH (ref 70–99)

## 2021-08-14 MED ORDER — HEPARIN SODIUM (PORCINE) 1000 UNIT/ML DIALYSIS
2000.0000 [IU] | INTRAMUSCULAR | Status: DC | PRN
  Filled 2021-08-14 (×2): qty 2

## 2021-08-14 MED ORDER — HYDROMORPHONE HCL 1 MG/ML IJ SOLN: 0.5000 mg | Freq: Three times a day (TID) | INTRAMUSCULAR | Status: DC | PRN

## 2021-08-14 NOTE — Progress Notes (Signed)
Physical Therapy Session Note  Patient Details  Name: Maxwell Harmon MRN: [id number] Date of Birth: 07-08-1956  Today's Date: 08/14/2021 PT Individual Time: 0800-0909 PT Individual Time Calculation (min): 69 min   Short Term Goals: Week 1:  PT Short Term Goal 1 (Week 1): pt will transfer sit<>stand with LRAD and min A PT Short Term Goal 1 - Progress (Week 1): Progressing toward goal PT Short Term Goal 2 (Week 1): pt will transfer stand<>pivot with LRAD and min A PT Short Term Goal 2 - Progress (Week 1): Progressing toward goal PT Short Term Goal 3 (Week 1): pt will perform simulated car transfer with CGA PT Short Term Goal 3 - Progress (Week 1): Progressing toward goal Week 2:  PT Short Term Goal 1 (Week 2): pt will transfer sit<>stand with LRAD and min A PT Short Term Goal 2 (Week 2): pt will transfer stand<>pivot with LRAD and min A PT Short Term Goal 3 (Week 2): pt will perform simulated car transfer with CGA  Skilled Therapeutic Interventions/Progress Updates:   Received pt semi-reclined in bed eating breakfast, pt agreeable to PT treatment, and denied any pain during session and reported sleeping well last night. Session with emphasis on functional mobility/transfers, generalized strengthening, dynamic standing balance/coordination, simulated car transfers, and improved activity tolerance. Of note, difficulty getting true O2 sat with portable pulse ox and dynomat, therefore went off pt report when adjusting O2 levels during session. Donned pants in supine with max A via rolling and donned limb guard with max A. Pt transferred semi-reclined<>sitting EOB with HOB elevated and use of bedrails with supervision. Donned R shoe with max A and pt transferred bed<>WC via lateral scoots with min/mod A and cues for technique. RN present to administer medication and pt performed WC mobility 38ft using BUE and supervision to ortho gym with 2 rest breaks due to fatigue. O2 sat reading 100% on 2L and pt  reported not having on O2 this weekend when going outside with family and felt fine. Removed O2 and pt performed simulated car transfer via slideboard with mod A. Pt required cues for sequencing/technique. Attempted to exit car using squat<>pivot method but pt unable to clear buttocks despite 3 attempts. Pt reported mild SOB and required extensive rest break but O2 sat 95% on RA. Pt transferred sit<>stand inside // bars with mod A x 2 trials. Pt able to remain standing ~30 seconds prior to sitting due to feeling like R knee was going to "buckle". Pt transported back to room in [facility] total A. Concluded session with pt sitting in WC, needs within reach, and seatbelt alarm on. Pt left on 1L O2 with sat 99%. Encouraged pt to continue to use incentive spirometer and pt demonstrated understanding of how to use it.   Therapy Documentation Precautions:  Precautions Precautions: Fall Precaution Comments: wound vac Restrictions Weight Bearing Restrictions: Yes LLE Weight Bearing: Non weight bearing  Therapy/Group: Individual Therapy Alfonse Alpers PT, DPT   08/14/2021, 7:19 AM

## 2021-08-14 NOTE — Progress Notes (Signed)
Patient ID: Maxwell Harmon, male   DOB: [birth year], 65 y.o.   MRN: [id number]  [facility] referral sent to [place], [place]

## 2021-08-14 NOTE — Plan of Care (Signed)
  Problem: RH Ambulation Goal: LTG Patient will ambulate in controlled environment (PT) Description: LTG: Patient will ambulate in a controlled environment, # of feet with assistance (PT). Outcome: Not Applicable Flowsheets (Taken 08/14/2021 0747) LTG: Pt will ambulate in controlled environ  assist needed:: (D/C) -- Note: D/C Goal: LTG Patient will ambulate in home environment (PT) Description: LTG: Patient will ambulate in home environment, # of feet with assistance (PT). Outcome: Not Applicable Flowsheets (Taken 08/14/2021 0747) LTG: Pt will ambulate in home environ  assist needed:: (D/C) -- Note: D/C   Problem: RH Balance Goal: LTG Patient will maintain dynamic standing balance (PT) Description: LTG:  Patient will maintain dynamic standing balance with assistance during mobility activities (PT) Flowsheets (Taken 08/14/2021 0747) LTG: Pt will maintain dynamic standing balance during mobility activities with:: (downgraded due to fatigue, weakness, decreased balance/postural control) Contact Guard/Touching assist Note: downgraded due to fatigue, weakness, decreased balance/postural control   Problem: Sit to Stand Goal: LTG:  Patient will perform sit to stand with assistance level (PT) Description: LTG:  Patient will perform sit to stand with assistance level (PT) Flowsheets (Taken 08/14/2021 0747) LTG: PT will perform sit to stand in preparation for functional mobility with assistance level: (downgraded due to fatigue, weakness, decreased balance/postural control) Contact Guard/Touching assist Note: downgraded due to fatigue, weakness, decreased balance/postural control   Problem: RH Bed Mobility Goal: LTG Patient will perform bed mobility with assist (PT) Description: LTG: Patient will perform bed mobility with assistance, with/without cues (PT). Flowsheets (Taken 08/14/2021 0747) LTG: Pt will perform bed mobility with assistance level of: (downgraded due to fatigue, weakness)  Supervision/Verbal cueing Note: downgraded due to fatigue, weakness   Problem: RH Car Transfers Goal: LTG Patient will perform car transfers with assist (PT) Description: LTG: Patient will perform car transfers with assistance (PT). Flowsheets (Taken 08/14/2021 0747) LTG: Pt will perform car transfers with assist:: (downgraded due to fatigue, weakness, decreased balance/postural control) Contact Guard/Touching assist Note: downgraded due to fatigue, weakness, decreased balance/postural control

## 2021-08-14 NOTE — Progress Notes (Signed)
Pt left for dialysis via bed with transport. Patient assessed in no acute distress does complain of some anxiousness while awaiting for dialysis pick up time. Pt on 2L oxygen via nasal cannula

## 2021-08-14 NOTE — Progress Notes (Signed)
Occupational Therapy Session Note  Patient Details  Name: Maxwell Harmon MRN: [id number] Date of Birth: 03/12/1956  Today's Date: 08/14/2021 OT Individual Time: 1100-1156 OT Individual Time Calculation (min): 56 min    Short Term Goals: Week 2:  OT Short Term Goal 1 (Week 2): Pt will perform LB dressing with MIN A OT Short Term Goal 2 (Week 2): Pt will complete MIN A squat pivot transfer to [facility] OT Short Term Goal 3 (Week 2): Pt will engage in self-care tasks for 5 minutes w/o requiring external O2 support OT Short Term Goal 4 (Week 2): Pt will complete sit > stand with min assist to decrease burden of care with LB dressing  Skilled Therapeutic Interventions/Progress Updates:    Patient seated in w/c on 1L O2 via [place] - O2 sat 100%, HR 80 - taken off of oxygen for session with saturation 96-100% and HR 80-88 with light seated activity.  He denies pain and states that he would like to work on getting stronger.  To therapy gym via w/c - sit pivot transfer w/c to mat mod A.   Completed unsupported sitting activities for 30 minutes with focus on posture, trunk mobility and upper body conditioning - rest breaks needed after each activity.  Sit pivot transfer back to w/c min A.  Completed additional arm exercises at w/c level with good tolerance.  SB transfer w/c to bed min A.  Sit to supine CGA.  He is able to direct care for positioning in bed to prepare for lunch before dialysis appointment.  Nursing present and aware of oxygen weaning.  Bed alarm set and call bell in hand at close of session.    Therapy Documentation Precautions:  Precautions Precautions: Fall Precaution Comments: wound vac Restrictions Weight Bearing Restrictions: Yes LLE Weight Bearing: Non weight bearing   Therapy/Group: Individual Therapy  Carlos Levering 08/14/2021, 12:02 PM

## 2021-08-14 NOTE — Progress Notes (Addendum)
[facility] Progress Note   Subjective: Seen in room. No new events overnight. No complaints this am. For dialysis today.   Objective Vitals:   08/13/21 1502 08/13/21 1929 08/14/21 0500 08/14/21 0627  BP: 118/68 135/75  125/75  Pulse: 89 88  84  Resp: 16 20    Temp: (!) 97.5 F (36.4 C) 98 F (36.7 C)    TempSrc: Oral     SpO2: 100% 98%  99%  Weight:   124 kg   Height:         Additional Objective Labs: Basic Metabolic Panel: Recent Labs  Lab 08/07/21 1256 08/10/21 0937 08/11/21 1509  NA 128* 134* 130*  K 4.4 3.8 3.8  CL 91* 95* 93*  CO2 27 29 27   GLUCOSE 150* 94 104*  BUN 73* 31* 43*  CREATININE 6.96* 3.97* 5.03*  CALCIUM 8.6* 9.0 9.0  PHOS 4.8*  --  3.1   CBC: Recent Labs  Lab 08/07/21 1256 08/11/21 1509  WBC 6.7 6.2  HGB 9.0* 9.0*  HCT 30.3* 30.2*  MCV 104.8* 106.7*  PLT 152 146*   Blood Culture    Component Value Date/Time   SDES URINE, CATHETERIZED 06/16/2021 0640   SPECREQUEST  06/16/2021 0640    NONE Performed at [facility], [place] [address], [place], [place] [address]    CULT >=100,000 COLONIES/mL KLEBSIELLA PNEUMONIAE (A) 06/16/2021 0640   REPTSTATUS 06/18/2021 FINAL 06/16/2021 0640     Physical Exam General: Elderly man, in wheelchair, nad Heart: RRR No rub  Lungs: Clear bilaterally  Abdomen: soft non-tender  Extremities: L BKA in stump shrinker  Dialysis Access: LUE AVF +bruit  Medications:   vitamin C  1,000 mg Oral Daily   aspirin EC  81 mg Oral Daily   atorvastatin  40 mg Oral Daily   cinacalcet  30 mg Oral Weekly   clopidogrel  75 mg Oral Daily   darbepoetin ([name]) injection - DIALYSIS  200 mcg Intravenous Q Wed-HD   docusate sodium  200 mg Oral Daily   ferric citrate  420 mg Oral TID with meals   insulin aspart  0-6 Units Subcutaneous TID WC   melatonin  3 mg Oral QHS   midodrine  10 mg Oral TID WC   multivitamin  1 tablet Oral QHS   nutrition supplement (JUVEN)  1 packet Oral BID BM    pantoprazole  40 mg Oral Daily   senna  2 tablet Oral Daily   traZODone  50 mg Oral QHS   venlafaxine XR  37.5 mg Oral QPM    Dialysis Orders:  MWF [facility]  [place]   Assessment/Plan: 1.  Status post revision of left below-knee amputation: Admitted to inpatient rehabilitation unit. 2. ESRD: on HD MWF. HD Monday.   3. Anemia: Hemoglobin 9's, resumed [name]200 ug weekly while here on Wed's. 4. CKD-MBD: Calcium level acceptable with improving phosphorus levels on [place]. 5. Nutrition: Ongoing renal diet with protein supplementation and renal multivitamin.  Fluid restriction. 6. Hypertension/volume excess: With episodes of intradialytic hypotension-on midodrine 10 mg twice daily. Has dependent edema which has not been effected by HD for the most part.  7. Asterixis - not uremic and NH3 is wnl. Avoid gabapentin/ lyrica and excess narcotics as these can cause asterixis in ESRD pts. Oxycodone has being tapered down.   Lynnda Child PA-C [facility] 08/14/2021,10:02 AM   I have personally seen and examined this patient and agree with the assessment/plan as outlined below  by Larina Earthly PA-C. Josue Falconi,MD 08/14/2021 12:37 PM

## 2021-08-14 NOTE — Progress Notes (Signed)
PROGRESS NOTE   Subjective/Complaints: No complaints this morning Not feeling nauseous after having BM yesterday Does appear fatigued Slept better last night  ROS: Denies nausea   Objective:   No results found. Recent Labs    08/11/21 1509  WBC 6.2  HGB 9.0*  HCT 30.2*  PLT 146*    Recent Labs    08/11/21 1509  NA 130*  K 3.8  CL 93*  CO2 27  GLUCOSE 104*  BUN 43*  CREATININE 5.03*  CALCIUM 9.0   No intake or output data in the 24 hours ending 08/14/21 1118        Physical Exam: Vital Signs Blood pressure 125/75, pulse 84, temperature 98 F (36.7 C), resp. rate 20, height 5\' 11"  (1.803 m), weight 124 kg, SpO2 99 %. Gen: no distress, normal appearing HEENT: oral mucosa pink and moist, NCAT Cardio: Reg rate Chest: normal effort, normal rate of breathing Abd: soft, non-distended Ext: no edema Psych: pleasant, normal affect  Musculoskeletal:        General: Normal range of motion.     Cervical back: Normal range of motion.     Comments: Left knuckles with multiple scabs. Multiple bruises on BUE. L-BKA as below.  Pes planus deformity right foot without skin breakdown.   Skin:    Coloration: Skin is pale.    Neurological:     Mental Status: awakens and follows commands.     Comments: Normal language, no focal cn findings. UE grossly 4+/5. RLE 3+ prox to 4+/5 distally, LLE 2/5 proximally and limited by pain. Decreased sensation to PP and LT right foot. +bilateral hand tremor        Assessment/Plan: 1. Functional deficits which require 3+ hours per day of interdisciplinary therapy in a comprehensive inpatient rehab setting. Physiatrist is providing close team supervision and 24 hour management of active medical problems listed below. Physiatrist and rehab team continue to assess barriers to discharge/monitor patient progress toward functional and medical goals  Care Tool:  Bathing    Body  parts bathed by patient: Front perineal area, Right upper leg, Left upper leg   Body parts bathed by helper: Buttocks     Bathing assist Assist Level: Minimal Assistance - Patient > 75%     Upper Body Dressing/Undressing Upper body dressing   What is the patient wearing?: Pull over shirt    Upper body assist Assist Level: Minimal Assistance - Patient > 75%    Lower Body Dressing/Undressing Lower body dressing      What is the patient wearing?: Pants     Lower body assist Assist for lower body dressing: Maximal Assistance - Patient 25 - 49% (lateral leans)     Toileting Toileting    Toileting assist Assist for toileting: Contact Guard/Touching assist     Transfers Chair/bed transfer  Transfers assist     Chair/bed transfer assist level: Moderate Assistance - Patient 50 - 74%     Locomotion Ambulation   Ambulation assist   Ambulation activity did not occur: Safety/medical concerns (weakness, decreased balance, fear of falling)  Assist level: Moderate Assistance - Patient 50 - 74% Assistive device: Walker-rolling Max distance: 2'  Walk 10 feet activity   Assist  Walk 10 feet activity did not occur: Safety/medical concerns (weakness, decreased balance, fear of falling)        Walk 50 feet activity   Assist Walk 50 feet with 2 turns activity did not occur: Safety/medical concerns (weakness, decreased balance, fear of falling)         Walk 150 feet activity   Assist Walk 150 feet activity did not occur: Safety/medical concerns (weakness, decreased balance, fear of falling)         Walk 10 feet on uneven surface  activity   Assist Walk 10 feet on uneven surfaces activity did not occur: Safety/medical concerns (weakness, decreased balance, fear of falling)         Wheelchair     Assist Is the patient using a wheelchair?: Yes Type of Wheelchair: Manual    Wheelchair assist level: Supervision/Verbal cueing Max wheelchair  distance: 35ft    Wheelchair 50 feet with 2 turns activity    Assist        Assist Level: Supervision/Verbal cueing   Wheelchair 150 feet activity     Assist      Assist Level: Maximal Assistance - Patient 25 - 49%   Blood pressure 125/75, pulse 84, temperature 98 F (36.7 C), resp. rate 20, height 5\' 11"  (1.803 m), weight 124 kg, SpO2 99 %.  Medical Problem List and Plan: 1.  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  Impaired mobility: continue Heparin added             -antiplatelet therapy: Continue ASA/Plavix 3. Pain: Decrease Diluadid to 0.5mg  q8H prn. Oxycodone prn.              d/c'ed gabapentin given asterixis 4. Mood: LCSW to follow for evaluation and support.              -antipsychotic agents: N/A 5. Neuropsych: This patient is capable of making decisions on his own behalf. 6. Skin/Wound Care: Continue wound VAC for couple more days per ortho              --continue Vitamin C and Zinc with juven bid to promote wound healing.  7. Fluids/Electrolytes/Nutrition: Strict I/O. Routine check of labs with HD. 8. T2DM: Monitor BS ac/hs and use SSI for elevated BS             --Continue insulin Glargine with 2 units Novolog TID CBG (last 3)  Recent Labs    08/13/21 1712 08/13/21 2146 08/14/21 0628  GLUCAP 138* 139* 121*  Cbg's controlled -10/9: continue to hold semglee for now given sub-optimal po intake, novolog already stopped  9. ESRD: HD MWF at the end of the day to help with tolerance of therapy.              --on Sensipar and ferric citrate for metabolic bone disease.   -palliative care consulted as per family request Increased midodrine to 10mg  TID so he can tolerate more ultrafiltration 10. Anemia of chronic disease: Started on weekly  [name] 09/28.              --routine H/H check with HD.  11. Hypotension: Increase midodrine to 10mg  TID so he can tolerate more ultrafiltration 12. Depression with acute reaction?: On Effexor with trazodone to help with sleep.              --Klonopin prn for anxiety.   -wife here for support  -team providing egosupport as well 13. CAD: On ASA/Plavix and Lipitor. Monitor for symptoms with increase in activity.  14.  PAD: Continue Trental TID.  15.  Constipation: had BM with SMOG enema and nausea has resolved 16. Nausea: Improved at present.  17. Bilateral hand tremor: unknown cause- ammonia normal. Appreciate nephrology and medicine following. D/c Gabapentin. Decrease oxycodone to 2.5mg  q4H prn 18. Decreased appetite: start megace- cleared with nephrology 19. Insomnia: increased trazodone to 100mg  HS---seems to have helped  -advised pt to put on some music, back ground noise to help with falling asleep  -wife staying over helpful also      LOS: 11 days A FACE TO FACE EVALUATION WAS PERFORMED  Everet Flagg P Tyah Acord 08/14/2021, 11:18 AM

## 2021-08-14 NOTE — Progress Notes (Signed)
Pt returned to unit, call report received from Dialysis nurse. Patient only concerns is the change in his sleep meds.

## 2021-08-14 NOTE — Progress Notes (Signed)
Patient appears very sleepy and drowsy. Pt is alert and oriented once he is awakened however he will fall back asleep. Pt denies any distress just states he is tired.  Day shift nurse also informed that patient was very lethargic throughout the day. Held sleep aids tonight.

## 2021-08-15 ENCOUNTER — Encounter (HOSPITAL_COMMUNITY): Payer: Managed Care, Other (non HMO)

## 2021-08-15 DIAGNOSIS — Z89512 Acquired absence of left leg below knee: Secondary | ICD-10-CM | POA: Diagnosis not present

## 2021-08-15 DIAGNOSIS — R0602 Shortness of breath: Secondary | ICD-10-CM

## 2021-08-15 DIAGNOSIS — Z515 Encounter for palliative care: Secondary | ICD-10-CM | POA: Diagnosis not present

## 2021-08-15 DIAGNOSIS — I739 Peripheral vascular disease, unspecified: Secondary | ICD-10-CM | POA: Diagnosis not present

## 2021-08-15 LAB — GLUCOSE, CAPILLARY
Glucose-Capillary: 128 mg/dL — ABNORMAL HIGH (ref 70–99)
Glucose-Capillary: 160 mg/dL — ABNORMAL HIGH (ref 70–99)
Glucose-Capillary: 186 mg/dL — ABNORMAL HIGH (ref 70–99)
Glucose-Capillary: 162 mg/dL — ABNORMAL HIGH (ref 70–99)

## 2021-08-15 MED ORDER — CLONAZEPAM 0.25 MG PO TBDP
0.2500 mg | ORAL_TABLET | Freq: Every day | ORAL | Status: DC
  Administered 2021-08-15: 0.25 mg via ORAL
  Filled 2021-08-15: qty 1

## 2021-08-15 MED ORDER — CLONAZEPAM 0.5 MG PO TABS: 0.2500 mg | ORAL_TABLET | Freq: Every day | ORAL | Status: DC

## 2021-08-15 MED ORDER — HYDROMORPHONE HCL 1 MG/ML IJ SOLN: 0.5000 mg | Freq: Two times a day (BID) | INTRAMUSCULAR | Status: DC | PRN

## 2021-08-15 NOTE — Progress Notes (Signed)
Patient ID: Maxwell Harmon, male   DOB: 02-Dec-1955, 65 y.o.   MRN: [id number] Team Conference Report to Patient/Family  Team Conference discussion was reviewed with the patient and caregiver, including goals, any changes in plan of care and target discharge date.  Patient and caregiver express understanding and are in agreement.  The patient has a target discharge date of 08/19/21.  SW called pt spouse Clinical research associate) provided team conference information. Spouse pleased about extension. Spouse will attend family education on 10/18 1-4 PM [name] 08/15/2021, 1:42 PM

## 2021-08-15 NOTE — Progress Notes (Signed)
Physical Therapy Session Note  Patient Details  Name: Maxwell Harmon MRN: [id number] Date of Birth: 1956-01-31  Today's Date: 08/15/2021 PT Individual Time: 1100-1155 PT Individual Time Calculation (min): 55 min   Short Term Goals: Week 1:  PT Short Term Goal 1 (Week 1): pt will transfer sit<>stand with LRAD and min A PT Short Term Goal 1 - Progress (Week 1): Progressing toward goal PT Short Term Goal 2 (Week 1): pt will transfer stand<>pivot with LRAD and min A PT Short Term Goal 2 - Progress (Week 1): Progressing toward goal PT Short Term Goal 3 (Week 1): pt will perform simulated car transfer with CGA PT Short Term Goal 3 - Progress (Week 1): Progressing toward goal Week 2:  PT Short Term Goal 1 (Week 2): pt will transfer sit<>stand with LRAD and min A PT Short Term Goal 2 (Week 2): pt will transfer stand<>pivot with LRAD and min A PT Short Term Goal 3 (Week 2): pt will perform simulated car transfer with CGA  Skilled Therapeutic Interventions/Progress Updates:   Received pt sitting in [facility] with daughter present at bedside, pt agreeable to PT treatment, and denied any pain during session. Session with emphasis on functional mobility/transfers, generalized strengthening, dynamic standing balance/coordination, and improved endurance with activity. O2 sat 96% on RA. Pt transported to/from room in [facility] total A for time management and energy conservation purposes. Pt transferred WC<>mat via slideboard to R with CGA and total A to place board (while demonstrating technique to new NT in training). Attempted to stand x 3 attempts increasing elevation of mat each time with RW and max A (with 1 UE on RW and 1 UE on mat) but pt ultimately unable to stand reporting weakness in RLE. Switched hand placement to BUE support on RW and pt stood x 4 reps to fatigue with mod A from extremely elevated mat for ~20-30 second intervals. Pt then performed the following exercises sitting EOM with supervision and verbal  cues for technique: -LAQ on RLE with .75lb ankle weight 1x10 decreasing to no weight for additional 1x10 -hip flexion on RLE 2x10 Pt required multiple rest breaks throughout session due to fatigue and SOB. Pt transferred mat<>WC via slideboard to L with CGA and performed RLE strengthening on Kinetron at 20 cms/sec 1x30 and 1x20 reps with therapist providing manual counter resistance with emphasis on quad/glute strengthening. Pt agreed to stay sitting in [facility] for lunch. Concluded session with pt sitting in WC, needs within reach, and seatbelt alarm on.   Therapy Documentation Precautions:  Precautions Precautions: Fall Precaution Comments: wound vac Restrictions Weight Bearing Restrictions: Yes LLE Weight Bearing: Non weight bearing  Therapy/Group: Individual Therapy Alfonse Alpers PT, DPT   08/15/2021, 7:33 AM

## 2021-08-15 NOTE — Progress Notes (Signed)
Daily Progress Note   Patient Name: Maxwell Harmon       Date: 08/15/2021 DOB: Apr 04, 1956  Age: 65 y.o. MRN#: [id number] Attending Physician: Izora Ribas, MD Primary Care Physician: [name], DO Admit Date: 08/03/2021  Reason for Consultation/Follow-up: Establishing goals of care  Subjective: Patient is OOB to wheelchair. His wife/Angie and daughter/Susan are at bedside. Patient is alert and more engaged in conversation than when I saw him last week. He reports not sleeping well last night. Discussed that rehab provider is considering starting clonazepam at bedtime. Patient and family tell me that his time in [place] has been extended to 10/20; they are grateful he will have more time to continue improving his functional status.   He also reports shortness of breath; he state this has been "going on for a while". He is currently on room air, but has been using oxygen at night and intermittently throughout the day. He reports feeling short of breath regardless of whether he is on oxygen. Family expresses interest in a pulmonary consult.    Addendum (08/16/21): I discussed with Dr. Ranell Patrick, who agrees a pulmonary consult would be helpful.    Length of Stay: 12  Current Medications: Scheduled Meds:  . vitamin C  1,000 mg Oral Daily  . aspirin EC  81 mg Oral Daily  . atorvastatin  40 mg Oral Daily  . cinacalcet  30 mg Oral Weekly  . clonazepam  0.25 mg Oral QHS  . clopidogrel  75 mg Oral Daily  . darbepoetin ([name]) injection - DIALYSIS  200 mcg Intravenous Q Wed-HD  . docusate sodium  200 mg Oral Daily  . ferric citrate  420 mg Oral TID with meals  . insulin aspart  0-6 Units Subcutaneous TID WC  . melatonin  3 mg Oral QHS  . midodrine  10 mg Oral TID WC  . multivitamin  1  tablet Oral QHS  . nutrition supplement (JUVEN)  1 packet Oral BID BM  . pantoprazole  40 mg Oral Daily  . senna  2 tablet Oral Daily  . traZODone  50 mg Oral QHS  . venlafaxine XR  37.5 mg Oral QPM    PRN Meds: acetaminophen, bisacodyl, calcium carbonate, diphenhydrAMINE, guaiFENesin-dextromethorphan, heparin, HYDROmorphone (DILAUDID) injection, methocarbamol, ondansetron (ZOFRAN) IV, oxyCODONE, phenol, polyethylene glycol, prochlorperazine **OR** prochlorperazine **  OR** prochlorperazine, simethicone, sorbitol  Physical Exam Constitutional:      General: He is not in acute distress. Pulmonary:     Effort: Pulmonary effort is normal.  Musculoskeletal:     Left Lower Extremity: Left leg is amputated below knee.  Neurological:     Mental Status: He is alert and oriented to person, place, and time.     Motor: Weakness present.            Vital Signs: BP 137/77 (BP Location: Right Arm)   Pulse 88   Temp 98.6 F (37 C) (Oral)   Resp 20   Ht 5\' 11"  (1.803 m)   Wt 123 kg   SpO2 93%   BMI 37.82 kg/m  SpO2: SpO2: 93 % O2 Device: O2 Device: Room Air   Intake/output summary:  Intake/Output Summary (Last 24 hours) at 08/15/2021 1411 Last data filed at 08/15/2021 1310 Gross per 24 hour  Intake 600 ml  Output 2500 ml  Net -1900 ml   LBM: Last BM Date: 08/13/21 Baseline Weight: Weight: 80.2 kg Most recent weight: Weight: 123 kg       Palliative Assessment/Data: PPS 50%       Palliative Care Assessment & Plan   HPI/Patient Profile: 65 y.o. male  with past medical history of renal cancer s/p nephrectomy, ESRD on HD MWF), CAD s/p CABG in 2021, HFpEF, and severe peripheral artery disease. He was hospitalized 05/07/21 - 05/18/21 with recurrent ulcer and worsening pain in his LLE.  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 He has had issues with wound dehiscence and was admitted on 07/27/21 for revision of left BKA by Dr. Sharol Given. Post operative course was  complicated by hypotension, hypoxia, and volume overload as well as pain and depression symptoms. Due to functional decline, he was admitted to [name] on 08/03/2021.    Assessment: - severe PAD, s/p left BKA with revision - functional and mobility deficits secondary to above - ESRD on HD MWF - anemia - hypotension - constipation - anxiety/depression - nausea   Recommendations/Plan: Full code full scope Patient and wife are hopeful for continued improvement Goal of care is to return home Outpatient palliative referral PMT will continue to follow  Goals of Care and Additional Recommendations: Limitations on Scope of Treatment: Full Scope Treatment  Code Status: Full   Prognosis:  Unable to determine  Discharge Planning: Likely home with HH/PT    Thank you for allowing the Palliative Medicine Team to assist in the care of this patient.   Total Time 15 minutes Prolonged Time Billed  no       Greater than 50%  of this time was spent counseling and coordinating care related to the above assessment and plan.  Lavena Bullion, NP  Please contact Palliative Medicine Team phone at [phone] for questions and concerns.

## 2021-08-15 NOTE — Plan of Care (Signed)
  Problem: RH Toileting Goal: LTG Patient will perform toileting task (3/3 steps) with assistance level (OT) Description: LTG: Patient will perform toileting task (3/3 steps) with assistance level (OT)  Outcome: Not Applicable Flowsheets (Taken 08/15/2021 1348) LTG: Pt will perform toileting task (3/3 steps) with assistance level: (d/c) --   Problem: RH Balance Goal: LTG Patient will maintain dynamic standing with ADLs (OT) Description: LTG:  Patient will maintain dynamic standing balance with assist during activities of daily living (OT)  Flowsheets (Taken 08/15/2021 1348) LTG: Pt will maintain dynamic standing balance during ADLs with: (downgraded) Maximal Assistance - Patient 25 - 49% Note: Downgraded goal due decreased balance, strength and increased fatigue   Problem: Sit to Stand Goal: LTG:  Patient will perform sit to stand in prep for activites of daily living with assistance level (OT) Description: LTG:  Patient will perform sit to stand in prep for activites of daily living with assistance level (OT) Flowsheets (Taken 08/15/2021 1348) LTG: PT will perform sit to stand in prep for activites of daily living with assistance level: (downgraded) Moderate Assistance - Patient 50 - 74% Note: Downgraded goal due to decreased strength, balance and increased fatigue   Problem: RH Bathing Goal: LTG Patient will bathe all body parts with assist levels (OT) Description: LTG: Patient will bathe all body parts with assist levels (OT) Flowsheets (Taken 08/15/2021 1348) LTG: Pt will perform bathing with assistance level/cueing: (downgraded) Moderate Assistance - Patient 50 - 74% Note: Downgraded due to decreased balance, strength and increased fatigue   Problem: RH Dressing Goal: LTG Patient will perform lower body dressing w/assist (OT) Description: LTG: Patient will perform lower body dressing with assist, with/without cues in positioning using equipment (OT) Flowsheets (Taken 08/15/2021  1348) LTG: Pt will perform lower body dressing with assistance level of: (downgraded) Moderate Assistance - Patient 50 - 74% Note: Downgraded due to decreased balance, strength and increased fatigue   Problem: RH Toilet Transfers Goal: LTG Patient will perform toilet transfers w/assist (OT) Description: LTG: Patient will perform toilet transfers with assist, with/without cues using equipment (OT) Flowsheets (Taken 08/15/2021 1348) LTG: Pt will perform toilet transfers with assistance level of: (downgraded) Contact Guard/Touching assist Note: Downgraded due to decreased balance, strength and increased fatigue

## 2021-08-15 NOTE — Patient Care Conference (Signed)
Inpatient RehabilitationTeam Conference and Plan of Care Update Date: 08/15/2021   Time: 1:28 PM    Patient Name: Maxwell Harmon      Medical Record Number: [id number]  Date of Birth: November 24, 1955 Sex: Male         Room/Bed: 4M09C/4M09C-01 Payor Info: Payor: C[employer] / Plan: CIGNA MANAGED / Product Type: *No Product type* /    Admit Date/Time:  08/03/2021  3:36 PM  Primary Diagnosis:  S/P BKA (below knee amputation) unilateral, left [facility])  Hospital Problems: Principal Problem:   S/P BKA (below knee amputation) unilateral, left ([place]) Active Problems:   PAD (peripheral artery disease) ([place])    Expected Discharge Date: Expected Discharge Date: 08/24/21  Team Members Present: Physician leading conference: Dr. Leeroy Cha Social Worker Present: Erlene Quan, [place] Nurse Present: Dorthula Nettles, RN PT Present: Becky Sax, PT OT Present: Other (comment) [facility] Poindexter, OT) [place] Coordinator present : Gunnar Fusi, SLP     Current Status/Progress Goal Weekly Team Focus  Bowel/Bladder   Oliguric, Last Bowel movement at 10/9 still having constipated  resolve constipation  bowel medications   Swallow/Nutrition/ Hydration             ADL's   Supervision/setup UB bathing/dressing at sink, min A squat pivot transfer on 10/10, Min A lateral scoot, min A slideboard transfer on 10/10  Supervision overall  Unsupported sitting endurance, activity tolerance, core/BUE strengthening, care of residual limb, transfers, sit > stand, ADL retraining   Mobility   bed mobility min A, slideboard transfers min/mod A, sit<>stand in // bars with mod A, WC mobility 145ft supervision  supervision overall, Mod I WC mobility, min A gait  functional mobility/transfers, generalized strengthening, dynamic standing balance/coordination, amputee education, D/C planning, family education, and endurance.   Communication             Safety/Cognition/ Behavioral Observations  Aware of safety limitation,          Pain   Denies Pain  < 3  assess pain q 4hr and prn   Skin   L BKA sutures with gauze/shrinker no draainage noted. bruising to abdomen due to heparin injection  wound to show signs of healing, no drainage  assess skin q shift and prn     Discharge Planning:  Discharging home with spouse   Team Discussion: Nausea improved, constipation resolved, pain improved. Complains of insomnia, did not have a good previous night. Palliative care consulted for goals of care. Incision healing well. Has a poor appetite. Fatigues quickly. Will need HH at discharge.  Patient on target to meet rehab goals: yes, mobility is supervision/min assist, mod assist squat pivot. Bathing and dressing mod I at W/C level. Min assist with slide board transfers. Mod I at W/C level.   *See Care Plan and progress notes for long and short-term goals.   Revisions to Treatment Plan:  Adjusting medications, extended discharge to 10/20.  Teaching Needs: Family education, medication management, constipation management, skin/wound care, transfer training, slide board training, balance training, endurance training, safety awareness.  Current Barriers to Discharge: Decreased caregiver support, Medical stability, Home enviroment access/layout, Wound care, Lack of/limited family support, Weight, Hemodialysis, Weight bearing restrictions, Medication compliance, and Nutritional means  Possible Resolutions to Barriers: Family education with spouse, continue current medications, offer foods patient will enjoy.     Medical Summary Current Status: insomnia, ESRD, nausea, constipation, residual limb pain, fatigue, asterixis, anxiety  Barriers to Discharge: Medical stability;Wound care  Barriers to Discharge Comments: insomnia, ESRD, nausea, constipation,  residual limb pain, fatigue, asterixis, anxiety Possible Resolutions to [employer] Focus: add klonopin 0.25mg  HS, continue midodrine TID, continue zofran, continue prn enema  and bowel regimen, continue to monitor wound and wean opioid medication   Continued Need for Acute Rehabilitation Level of Care: The patient requires daily medical management by a physician with specialized training in physical medicine and rehabilitation for the following reasons: Direction of a multidisciplinary physical rehabilitation program to maximize functional independence : Yes Medical management of patient stability for increased activity during participation in an intensive rehabilitation regime.: Yes Analysis of laboratory values and/or radiology reports with any subsequent need for medication adjustment and/or medical intervention. : Yes   I attest that I was present, lead the team conference, and concur with the assessment and plan of the team.   Cristi Loron 08/15/2021, 2:25 PM

## 2021-08-15 NOTE — Progress Notes (Signed)
  [facility] Progress Note   Subjective: Seen in room. No acute events. Tolerated HD yesterday, net uf 2.5L.  Objective Vitals:   08/14/21 2258 08/14/21 2354 08/15/21 0500 08/15/21 0608  BP: 118/63 129/73  115/71  Pulse: 85 89  91  Resp: 18 20  18   Temp:  98.1 F (36.7 C)  98.4 F (36.9 C)  TempSrc:    Oral  SpO2:  98%  98%  Weight:   123 kg   Height:         Additional Objective Labs: Basic Metabolic Panel: Recent Labs  Lab 08/10/21 0937 08/11/21 1509  NA 134* 130*  K 3.8 3.8  CL 95* 93*  CO2 29 27  GLUCOSE 94 104*  BUN 31* 43*  CREATININE 3.97* 5.03*  CALCIUM 9.0 9.0  PHOS  --  3.1   CBC: Recent Labs  Lab 08/11/21 1509  WBC 6.2  HGB 9.0*  HCT 30.2*  MCV 106.7*  PLT 146*   Blood Culture    Component Value Date/Time   SDES URINE, CATHETERIZED 06/16/2021 0640   SPECREQUEST  06/16/2021 0640    NONE Performed at [facility], [place] [address], [place], [place] [address]    CULT >=100,000 COLONIES/mL KLEBSIELLA PNEUMONIAE (A) 06/16/2021 0640   REPTSTATUS 06/18/2021 FINAL 06/16/2021 0640     Physical Exam General: nad Heart: RRR Lungs: cta bl Abdomen: soft non-tender  Extremities: L BKA Neuro: awake, alert Dialysis Access: LUE AVF +bruit  Medications:   vitamin C  1,000 mg Oral Daily   aspirin EC  81 mg Oral Daily   atorvastatin  40 mg Oral Daily   cinacalcet  30 mg Oral Weekly   clopidogrel  75 mg Oral Daily   darbepoetin ([name]) injection - DIALYSIS  200 mcg Intravenous Q Wed-HD   docusate sodium  200 mg Oral Daily   ferric citrate  420 mg Oral TID with meals   insulin aspart  0-6 Units Subcutaneous TID WC   melatonin  3 mg Oral QHS   midodrine  10 mg Oral TID WC   multivitamin  1 tablet Oral QHS   nutrition supplement (JUVEN)  1 packet Oral BID BM   pantoprazole  40 mg Oral Daily   senna  2 tablet Oral Daily   traZODone  50 mg Oral QHS   venlafaxine XR  37.5 mg Oral QPM    Dialysis Orders:  MWF [facility]   [place]   Assessment/Plan: 1.  Status post revision of left below-knee amputation: Admitted to inpatient rehabilitation unit. 2. ESRD: on HD MWF. HD Monday.   3. Anemia: Hemoglobin 9's, resumed [name]200 ug weekly while here on Wed's. 4. CKD-MBD: Calcium level acceptable with improving phosphorus levels on [place]. 5. Nutrition: Ongoing renal diet with protein supplementation and renal multivitamin.  Fluid restriction. 6. Hypertension/volume excess: With episodes of intradialytic hypotension-on midodrine 10 mg twice daily. Has dependent edema which has not been affected by HD for the most part.  7. Asterixis - not uremic and NH3 is wnl. Avoid gabapentin/ lyrica and excess narcotics as these can cause asterixis in ESRD pts.  Gean Quint, MD [facility]

## 2021-08-15 NOTE — Progress Notes (Addendum)
PROGRESS NOTE   Subjective/Complaints: Appreciate nephrology note- tolerated HD better yesterday Interdisciplinary Team Conference today   [name] resumed for Hemglobin in 9s.  Weaning opioids to help with constipation/asterixis  ROS: Denies nausea   Objective:   No results found. No results for input(s): WBC, HGB, HCT, PLT in the last 72 hours.   No results for input(s): NA, K, CL, CO2, GLUCOSE, BUN, CREATININE, CALCIUM in the last 72 hours.   Intake/Output Summary (Last 24 hours) at 08/15/2021 1022 Last data filed at 08/15/2021 0735 Gross per 24 hour  Intake 440 ml  Output 2500 ml  Net -2060 ml          Physical Exam: Vital Signs Blood pressure 115/71, pulse 91, temperature 98.4 F (36.9 C), temperature source Oral, resp. rate 18, height 5\' 11"  (1.803 m), weight 123 kg, SpO2 98 %. Gen: no distress, normal appearing HEENT: oral mucosa pink and moist, NCAT Cardio: Reg rate Chest: normal effort, normal rate of breathing Abd: soft, non-distended Ext: no edema Psych: pleasant, normal affect   Musculoskeletal:        General: Normal range of motion.     Cervical back: Normal range of motion.     Comments: Left knuckles with multiple scabs. Multiple bruises on BUE. L-BKA as below.  Pes planus deformity right foot without skin breakdown.   Skin:    Coloration: Skin is pale.     Neurological:     Mental Status: awakens and follows commands.     Comments: Normal language, no focal cn findings. UE grossly 4+/5. RLE 3+ prox to 4+/5 distally, LLE 2/5 proximally and limited by pain. Decreased sensation to PP and LT right foot. +bilateral hand tremor   Assessment/Plan: 1. Functional deficits which require 3+ hours per day of interdisciplinary therapy in a comprehensive inpatient rehab setting. Physiatrist is providing close team supervision and 24 hour management of active medical problems listed  below. Physiatrist and rehab team continue to assess barriers to discharge/monitor patient progress toward functional and medical goals  Care Tool:  Bathing    Body parts bathed by patient: Front perineal area, Right upper leg, Left upper leg   Body parts bathed by helper: Buttocks     Bathing assist Assist Level: Minimal Assistance - Patient > 75%     Upper Body Dressing/Undressing Upper body dressing   What is the patient wearing?: Pull over shirt    Upper body assist Assist Level: Minimal Assistance - Patient > 75%    Lower Body Dressing/Undressing Lower body dressing      What is the patient wearing?: Pants     Lower body assist Assist for lower body dressing: Maximal Assistance - Patient 25 - 49% (lateral leans)     Toileting Toileting    Toileting assist Assist for toileting: Contact Guard/Touching assist     Transfers Chair/bed transfer  Transfers assist     Chair/bed transfer assist level: Moderate Assistance - Patient 50 - 74%     Locomotion Ambulation   Ambulation assist   Ambulation activity did not occur: Safety/medical concerns (weakness, decreased balance, fear of falling)  Assist level: Moderate Assistance - Patient 50 - 74% Assistive  device: Walker-rolling Max distance: 2'   Walk 10 feet activity   Assist  Walk 10 feet activity did not occur: Safety/medical concerns (weakness, decreased balance, fear of falling)        Walk 50 feet activity   Assist Walk 50 feet with 2 turns activity did not occur: Safety/medical concerns (weakness, decreased balance, fear of falling)         Walk 150 feet activity   Assist Walk 150 feet activity did not occur: Safety/medical concerns (weakness, decreased balance, fear of falling)         Walk 10 feet on uneven surface  activity   Assist Walk 10 feet on uneven surfaces activity did not occur: Safety/medical concerns (weakness, decreased balance, fear of falling)          Wheelchair     Assist Is the patient using a wheelchair?: Yes Type of Wheelchair: Manual    Wheelchair assist level: Supervision/Verbal cueing Max wheelchair distance: 3ft    Wheelchair 50 feet with 2 turns activity    Assist        Assist Level: Supervision/Verbal cueing   Wheelchair 150 feet activity     Assist      Assist Level: Maximal Assistance - Patient 25 - 49%   Blood pressure 115/71, pulse 91, temperature 98.4 F (36.9 C), temperature source Oral, resp. rate 18, height 5\' 11"  (1.803 m), weight 123 kg, SpO2 98 %.  Medical Problem List and Plan: 1.  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  Impaired mobility: continue Heparin added             -antiplatelet therapy: Continue ASA/Plavix 3. Pain: Decrease Diluadid to 0.5mg  q12H prn. Oxycodone prn.              d/c'ed gabapentin given asterixis 4. Anxiety: LCSW to follow for evaluation and support. Scheduled Klonopin .25mg  HS             -antipsychotic agents: N/A 5. Neuropsych: This patient is capable of making decisions on his own behalf. 6. Skin/Wound Care: Continue wound VAC for couple more days per ortho              --continue Vitamin C and Zinc with juven bid to promote wound healing.  7. Fluids/Electrolytes/Nutrition: Strict I/O. Routine check of labs with HD. 8. T2DM: Monitor BS ac/hs and use SSI for elevated BS             --Continue insulin Glargine with 2 units Novolog TID CBG (last 3)  Recent Labs    08/14/21 1630 08/14/21 2355 08/15/21 0609  GLUCAP 130* 138* 128*  Cbg's controlled -10/9: continue to hold semglee for now given sub-optimal po intake, novolog already stopped  9. ESRD: HD MWF at the end of the day to help with tolerance of therapy.              --on  Sensipar and ferric citrate for metabolic bone disease.   -palliative care consulted as per family request Increased midodrine to 10mg  TID so he can tolerate more ultrafiltration 10. Anemia of chronic disease: Started on weekly [name] 09/28.              --  routine H/H check with HD.  11. Hypotension: Increase midodrine to 10mg  TID so he can tolerate more ultrafiltration 12. Depression with acute reaction?: On Effexor with trazodone to help with sleep.              --Klonopin prn for anxiety.   -wife here for support  -team providing egosupport as well 13. CAD: On ASA/Plavix and Lipitor. Monitor for symptoms with increase in activity.  14.  PAD: Continue Trental TID.  15.  Constipation: had BM with SMOG enema and nausea has resolved 16. Nausea: Improved at present.  17. Bilateral hand tremor: unknown cause- ammonia normal. Appreciate nephrology and medicine following. D/c Gabapentin. Decrease oxycodone to 2.5mg  q4H prn 18. Decreased appetite: start megace- cleared with nephrology 19. Insomnia: continue trazodone 50mg  HS  -add klonopin 0.25mg  HS.   -advised pt to put on some music, back ground noise to help with falling asleep  -wife staying over helpful also      LOS: 12 days A FACE TO FACE EVALUATION WAS PERFORMED  Maxwell Harmon Maxwell Harmon 08/15/2021, 10:22 AM

## 2021-08-15 NOTE — Progress Notes (Signed)
Occupational Therapy Session Note  Patient Details  Name: Maxwell Harmon MRN: [id number] Date of Birth: 1956-08-23  Today's Date: 08/15/2021 OT Individual Time: 0203-0231 OT Individual Time Calculation (min): 28 min    Short Term Goals: Week 2:  OT Short Term Goal 1 (Week 2): Pt will perform LB dressing with MIN A OT Short Term Goal 2 (Week 2): Pt will complete MIN A squat pivot transfer to [facility] OT Short Term Goal 3 (Week 2): Pt will engage in self-care tasks for 5 minutes w/o requiring external O2 support OT Short Term Goal 4 (Week 2): Pt will complete sit > stand with min assist to decrease burden of care with LB dressing  Skilled Therapeutic Interventions/Progress Updates:  Skilled OT intervention completed with focus on BUE/core strengthening while unsupported at the w/c level. Pt received seated in w/c on RA with wife present and agreeable to therapy session. SPO2 95% on RA. Education provided about therapy goals, with downgraded goals from supervision to min-mod assist for most goals due to current status and set back from last week/missed therapies. Discussed pt's desire to shower and what home set up looks like and PLOF with tub/shower transfers. Pt participated in [employer] while unsupported in w/c with 2 pound dowel including the following exercises: - Chest presses 2x10 - Bicep flexion 2x10 - R and L rowing  x10 Rechecked SPO2 on RA, at 92%. Repositioned pt with pillow for trunk support to eat soup with wife. Pt left seated in w/c, with alarm belt donned/activated, with wife in room at end of session.  Therapy Documentation Precautions:  Precautions Precautions: Fall Precaution Comments: wound vac Restrictions Weight Bearing Restrictions: Yes LLE Weight Bearing: Non weight bearing  Pain: No c/o pain during session.  Therapy/Group: Individual Therapy  Monetta Lick E Tajuanna Burnett 08/15/2021, 10:48 AM

## 2021-08-15 NOTE — Progress Notes (Signed)
Occupational Therapy Session Note  Patient Details  Name: Maxwell Harmon MRN: [id number] Date of Birth: 09/04/1956  Today's Date: 08/15/2021 OT Individual Time: 0903-1000 OT Individual Time Calculation (min): 57 min    Short Term Goals: Week 2:  OT Short Term Goal 1 (Week 2): Pt will perform LB dressing with MIN A OT Short Term Goal 2 (Week 2): Pt will complete MIN A squat pivot transfer to [facility] OT Short Term Goal 3 (Week 2): Pt will engage in self-care tasks for 5 minutes w/o requiring external O2 support OT Short Term Goal 4 (Week 2): Pt will complete sit > stand with min assist to decrease burden of care with LB dressing  Skilled Therapeutic Interventions/Progress Updates:  Skilled OT intervention completed with focus on unsupported sitting balance/endurance and functional transfers. Pt received in bed, [place] donned, with daughter present. Pt agreeable to session. Long sitting > EOB with min A, EOB > w/c squat pivot with min A. SPO2 on 2L, 100%, doffed [place] for O2 weaning with monitoring throughout session. Therapist transported pt in w/c to therapy gym, completed lateral scoot w/c > EOM with min A and cues needed to lift L hip during transfers. While seated EOM, pt required intermittent verbal/tactile cues to facilitate upright posture as pt relies heavily on support of both hands on lap/mat, and demonstrated forward head/rounded thoracic spine. Pt completed 10 scapular squeezes, with 3 sec holds to further promote functional posture. Rest breaks required for pt to regulate breathing. Education provided on breathing mechanisms with rounded spine vs upright and the ease of breathing with better posture. SPO2 on RA, 90%, encouraged deeps breaths, returning to 95%. Pt completed BUE reaching task using cones at shoulder height to promote unsupported sitting with upright posture, core stability, and forward lean for transfers. Pt with CGA throughout seated task. Transferred EOM > w/c via lateral scoot  with min A. In pt room, completed w/c <> BSC SB transfer with min A and therapist positioning SB with daughter present for education for level of assist needed. Daughter expressing that she will not be the primary caregiver responsible for transfers, however education provided on technique and how to best support the pt in his bathroom environment in the event she does assist. Pt left seated in w/c, with alarm belt donned/activated and set up at sink for grooming tasks, with daughter present in room upon therapist departure.  Therapy Documentation Precautions:  Precautions Precautions: Fall Precaution Comments: wound vac Restrictions Weight Bearing Restrictions: Yes LLE Weight Bearing: Non weight bearing  Pain: No c/o during session.  Therapy/Group: Individual Therapy  Lynann Demetrius E Lorice Lafave 08/15/2021, 10:25 AM

## 2021-08-16 ENCOUNTER — Inpatient Hospital Stay (HOSPITAL_COMMUNITY): Payer: Managed Care, Other (non HMO)

## 2021-08-16 ENCOUNTER — Telehealth: Payer: Self-pay

## 2021-08-16 DIAGNOSIS — J90 Pleural effusion, not elsewhere classified: Secondary | ICD-10-CM

## 2021-08-16 LAB — BODY FLUID CELL COUNT WITH DIFFERENTIAL
Eos, Fluid: 0 %
Lymphs, Fluid: 12 %
Monocyte-Macrophage-Serous Fluid: 85 % (ref 50–90)
Neutrophil Count, Fluid: 3 % (ref 0–25)
Total Nucleated Cell Count, Fluid: 65 cu mm (ref 0–1000)

## 2021-08-16 LAB — LACTATE DEHYDROGENASE, PLEURAL OR PERITONEAL FLUID: LD, Fluid: 44 U/L — ABNORMAL HIGH (ref 3–23)

## 2021-08-16 LAB — GLUCOSE, CAPILLARY
Glucose-Capillary: 124 mg/dL — ABNORMAL HIGH (ref 70–99)
Glucose-Capillary: 131 mg/dL — ABNORMAL HIGH (ref 70–99)
Glucose-Capillary: 139 mg/dL — ABNORMAL HIGH (ref 70–99)

## 2021-08-16 LAB — LACTATE DEHYDROGENASE: LDH: 136 U/L (ref 98–192)

## 2021-08-16 LAB — PROTEIN, PLEURAL OR PERITONEAL FLUID: Total protein, fluid: <3 g/dL

## 2021-08-16 LAB — PROTEIN, TOTAL: Total Protein: 5.6 g/dL — ABNORMAL LOW (ref 6.5–8.1)

## 2021-08-16 MED ORDER — CLONAZEPAM 0.25 MG PO TBDP: 0.2500 mg | ORAL_TABLET | Freq: Every day | ORAL | Status: DC

## 2021-08-16 MED ORDER — LINACLOTIDE 145 MCG PO CAPS
145.0000 ug | ORAL_CAPSULE | Freq: Every day | ORAL | Status: DC
  Administered 2021-08-17: 145 ug via ORAL
  Filled 2021-08-16: qty 1

## 2021-08-16 MED ORDER — RAMELTEON 8 MG PO TABS
8.0000 mg | ORAL_TABLET | Freq: Every day | ORAL | Status: DC
  Administered 2021-08-16 – 2021-08-23 (×8): 8 mg via ORAL
  Filled 2021-08-16 (×9): qty 1

## 2021-08-16 NOTE — Progress Notes (Signed)
Occupational Therapy Session Note  Patient Details  Name: YOUSAF SAINATO MRN: [id number] Date of Birth: 1956-01-31  Today's Date: 08/16/2021 OT Missed Time: 75 Minutes Missed Time Reason: Patient fatigue;Other (comment) (lethargic, unable to wake, verbal request to not participate)  Pt missed 75 mins scheduled OT treatment session secondary to lethargy, fatigue, and inability to to stay alert with external stimuli. Per RN, pt requested sleeping meds last night and was able to sleep through the night, and reported the effects are probably lingering this AM. Offered pt breakfast, and bed level ADLs, with pt stating he needs to sleep off meds and try again with next therapy session. Will attempt to see pt later in day as schedule and pt availability allows.  Therapy/Group: Individual Therapy  Bessye Stith E Robynn Marcel 08/16/2021, 8:54 AM

## 2021-08-16 NOTE — Progress Notes (Signed)
  [facility] Progress Note   Subjective: Seen in room. Received sleeping meds last night, drowsy this AM, unable to stay awake but wakes up to vocal stimuli. No other complaints.  Objective Vitals:   08/15/21 0608 08/15/21 1318 08/15/21 1947 08/16/21 0508  BP: 115/71 137/77 (!) 146/91 118/70  Pulse: 91 88 93 89  Resp: 18 20 17 17   Temp: 98.4 F (36.9 C) 98.6 F (37 C) 97.9 F (36.6 C) 97.8 F (36.6 C)  TempSrc: Oral Oral    SpO2: 98% 93% 94% 99%  Weight:      Height:         Additional Objective Labs: Basic Metabolic Panel: Recent Labs  Lab 08/10/21 0937 08/11/21 1509  NA 134* 130*  K 3.8 3.8  CL 95* 93*  CO2 29 27  GLUCOSE 94 104*  BUN 31* 43*  CREATININE 3.97* 5.03*  CALCIUM 9.0 9.0  PHOS  --  3.1   CBC: Recent Labs  Lab 08/11/21 1509  WBC 6.2  HGB 9.0*  HCT 30.2*  MCV 106.7*  PLT 146*   Blood Culture    Component Value Date/Time   SDES URINE, CATHETERIZED 06/16/2021 0640   SPECREQUEST  06/16/2021 0640    NONE Performed at [facility], [place] [address] [place], [place] [address]    CULT >=100,000 COLONIES/mL KLEBSIELLA PNEUMONIAE (A) 06/16/2021 0640   REPTSTATUS 06/18/2021 FINAL 06/16/2021 0640     Physical Exam General: nad Heart: RRR Lungs: cta bl Abdomen: soft non-tender  Extremities: L BKA Neuro: drowsy, awake with verbal stimuli Dialysis Access: LUE AVF +bruit  Medications:   vitamin C  1,000 mg Oral Daily   aspirin EC  81 mg Oral Daily   atorvastatin  40 mg Oral Daily   cinacalcet  30 mg Oral Weekly   clonazepam  0.25 mg Oral QHS   clopidogrel  75 mg Oral Daily   darbepoetin ([name]) injection - DIALYSIS  200 mcg Intravenous Q Wed-HD   docusate sodium  200 mg Oral Daily   ferric citrate  420 mg Oral TID with meals   insulin aspart  0-6 Units Subcutaneous TID WC   melatonin  3 mg Oral QHS   midodrine  10 mg Oral TID WC   multivitamin  1 tablet Oral QHS   nutrition supplement (JUVEN)  1 packet Oral BID  BM   pantoprazole  40 mg Oral Daily   senna  2 tablet Oral Daily   traZODone  50 mg Oral QHS   venlafaxine XR  37.5 mg Oral QPM    Dialysis Orders:  MWF [facility]  [place]   Assessment/Plan: 1.  Status post revision of left below-knee amputation: Admitted to inpatient rehabilitation unit. 2. ESRD: on HD MWF. HD today, labs today 3. Anemia: Hemoglobin 9's, resumed [name]200 ug weekly while here on Wed's. 4. CKD-MBD: Calcium level acceptable with improving phosphorus levels on [place]. 5. Nutrition: Ongoing renal diet with protein supplementation and renal multivitamin.  Fluid restriction. 6. Hypertension/volume excess: With episodes of intradialytic hypotension-on midodrine 10 mg twice daily. Has some dependent edema which has not been affected by HD for the most part.  7. Asterixis - not uremic and NH3 is wnl. Avoid gabapentin/ lyrica and excess narcotics as these can cause asterixis in ESRD pts.  Gean Quint, MD [facility]

## 2021-08-16 NOTE — Procedures (Addendum)
Written informed consent was obtained from the patient Rapid response nurse was present at the bedside to monitor vital signs  Bedside ultrasound was used and point marked in the seventh intercostal space in the right scapular line. Local anesthetic was used.  With sternal precautions, thoracentesis needle was inserted and 1.5 L of amber-yellow fluid removed and sent for cell count, chemistry and microbiology Patient tolerated procedure well, postprocedure ultrasound showed sliding lung and no evidence of pneumothorax. Chest x-ray postprocedure did not show any evidence of pneumothorax and decrease effusions. Minimal bleeding was noted  Daine Croker V. Elsworth Soho MD [place]

## 2021-08-16 NOTE — Progress Notes (Signed)
Physical Therapy Session Note  Patient Details  Name: Maxwell Harmon MRN: [id number] Date of Birth: [birth year]  Today's Date: 08/16/2021 PT Individual Time: 1100-1111 PT Individual Time Calculation (min): 11 min  Today's Date: 08/16/2021 PT Missed Time: 49 Minutes Missed Time Reason: Patient fatigue  Short Term Goals: Week 1:  PT Short Term Goal 1 (Week 1): pt will transfer sit<>stand with LRAD and min A PT Short Term Goal 1 - Progress (Week 1): Progressing toward goal PT Short Term Goal 2 (Week 1): pt will transfer stand<>pivot with LRAD and min A PT Short Term Goal 2 - Progress (Week 1): Progressing toward goal PT Short Term Goal 3 (Week 1): pt will perform simulated car transfer with CGA PT Short Term Goal 3 - Progress (Week 1): Progressing toward goal Week 2:  PT Short Term Goal 1 (Week 2): pt will transfer sit<>stand with LRAD and min A PT Short Term Goal 2 (Week 2): pt will transfer stand<>pivot with LRAD and min A PT Short Term Goal 3 (Week 2): pt will perform simulated car transfer with CGA  Skilled Therapeutic Interventions/Progress Updates:   Received pt returning from X-ray. Therapist assisted technician with getting pt comfortable in room. Pt very lethargic and unable to keep eyes open for more than a few seconds. Pt reported sleeping well last night but unable to stay awake (possibly due to sleeping medication?) Pulmonologist present during session recommending procedure to remove more fluid surrounding lungs - plan for procedure this afternoon. Pt unable to participate any further in session due to difficulty sustaining arousal - will attempt to make up time as schedule allows. Concluded session with pt semi-reclined in bed asleep with all needs within reach. 49 minutes missed of skilled physical therapy due to fatigue.   Therapy Documentation Precautions:  Precautions Precautions: Fall Precaution Comments: wound vac Restrictions Weight Bearing Restrictions: Yes LLE  Weight Bearing: Non weight bearing  Therapy/Group: Individual Therapy Alfonse Alpers PT, DPT   08/16/2021, 7:43 AM

## 2021-08-16 NOTE — Progress Notes (Signed)
Patient underwent thoracenteses of the right lung this afternoon. Patient tolerated procedure well, 30 minute and 1 hour post assessment unchanged with band aid dressing and some serosanguinous drainage. No complaints of pain, call light and personal belongings within reach. Angie Fava

## 2021-08-16 NOTE — Telephone Encounter (Signed)
Chasity from [facility] called and stated they are out of the service network for [place] for patient. Sent an email to [place] her that they are not in the network.

## 2021-08-16 NOTE — Progress Notes (Signed)
Pt receives out-pt HD at [facility] on MWF. Pt arrives around 12:10 for 12:30 chair time. Clinic does not require any additional information for pt to resume care at d/c. Will advise clinic of pt's possible d/c date once known. Will follow and assist.  Melven Sartorius Renal Navigator [phone]

## 2021-08-16 NOTE — Progress Notes (Signed)
Occupational Therapy Session Note  Patient Details  Name: Maxwell Harmon MRN: [id number] Date of Birth: 01/31/1956  Today's Date: 08/16/2021 OT Individual Time: 1005-1030 OT Individual Time Calculation (min): 25 min    Short Term Goals: Week 2:  OT Short Term Goal 1 (Week 2): Pt will perform LB dressing with MIN A OT Short Term Goal 2 (Week 2): Pt will complete MIN A squat pivot transfer to [facility] [place] OT Short Term Goal 3 (Week 2): Pt will engage in self-care tasks for 5 minutes w/o requiring external O2 support OT Short Term Goal 4 (Week 2): Pt will complete sit > stand with min assist to decrease burden of care with LB dressing  Skilled Therapeutic Interventions/Progress Updates:    Pt in bed to start session sleeping.  He was able to arouse to verbal stimulation but remained groggy and delayed a few seconds with response time to questions as therapist asked.  He reported missing therapy this am secondary to not being able to wake up.  He was agreeable to participating in this session with focus on getting OOB to the chair.  Noted pt with torn brief, so worked on donning new brief and shorts in the bed rolling side to side.  Min assist for rolling with max assist to donn brief.  Therapist assisted with donning shorts over his LEs and then he was able to roll and pull them up over his hips at mod assist level.  Limb guard was also donned over the LLE as well with total assist prior to transfer to the EOB at min assist level.  Therapist assisted with donning the right shoe secondary to decreased time and pt's grogginess.  Oxygen sats were 95% on 2Ls nasal cannula.  First attempt for squat pivot transfer to the left had pt pushing the wheelchair away with the LUE.  On second attempt had pt transfer while just holding onto the therapist at total assist.  Very minimal assist noted from pt with this.  He was able to scoot back in the wheelchair once over to it with mod assist however.  Finished session with  pt sitting in the wheelchair and nursing present as pt needed to transfer back to the bed for transfer to x-ray.  Discussed with nursing to complete sliding board transfer with use of +2 assist.     Therapy Documentation Precautions:  Precautions Precautions: Fall Precaution Comments: wound vac Required Braces or Orthoses: Other Brace Other Brace: limb guard on the LLE Restrictions Weight Bearing Restrictions: No LLE Weight Bearing: Non weight bearing  Pain: Pain Assessment Pain Scale: Faces Pain Score: 0-No pain ADL: See Care Tool Section for some details of mobility and selfcare  Therapy/Group: Individual Therapy  Bruin Bolger OTR/L 08/16/2021, 12:30 PM

## 2021-08-16 NOTE — Consult Note (Signed)
Name: Maxwell Harmon MRN: [id number] DOB: 01-22-1956    ADMISSION DATE:  08/03/2021 CONSULTATION DATE:  08/16/2021   REFERRING MD :  Ranell Patrick, rehab MD  CHIEF COMPLAINT:  resp distress   HISTORY OF PRESENT ILLNESS: 65 year old man with ESRD on hemodialysis, ischemic cardiomyopathy with EF of 30%, left BKA revision who was in rehab.  PCCM consulted for bilateral pleural effusions, difficult volume removal with dialysis. He has severe PAD.  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  He had wound dehiscence and was readmitted 07/27/2021 for revision of left BKA.  He had a complicated postoperative course with hypertension, volume overload, pain and depression with hypoxia.  Transferred to [name] on 9/29. He is followed by renal.  In spite of dialysis he has bilateral effusions and pedal edema and persistent shortness of breath.  He is on oxycodone for pain relief, this was decreased due to asterixis and clonazepam resumed. He is sleepy this morning and reports mild shortness of breath not relieved by oxygen.  He denies chest pain or wheezing.  He is a lifetime never smoker  PAST MEDICAL HISTORY :  Right nephrectomy for renal cancer Left BKA, revision Diabetes type 2 ESRD on dialysis Severe PAD CABG 2021 05/2021 echo showing EF of 30% 04/2021 LHC >> severe CAD, no targets for revascularization  Past Medical History:  Diagnosis Date   Anemia of chronic disease    Anxiety    CAD (coronary artery disease)    Depression    Diabetes mellitus without complication ([place])    ESRD (end stage renal disease) on dialysis ([place])    MWF in [place]   History of bleeding ulcers    History of TIAs    Hypertension    PAD (peripheral artery disease) ([facility])    Renal cancer, right ([place])    s/p right radical nephrectomy 01/08/19   Past Surgical History:  Procedure Laterality Date   ABDOMINAL AORTOGRAM  W/LOWER EXTREMITY Bilateral 04/14/2021   Procedure: ABDOMINAL AORTOGRAM W/LOWER EXTREMITY;  Surgeon: Elam Dutch, MD;  Location: [facility];  Service: Vascular;  Laterality: Bilateral;   AMPUTATION Left 05/12/2021   Procedure: LEFT BELOW KNEE AMPUTATION;  Surgeon: Newt Minion, MD;  Location: [facility];  Service: Orthopedics;  Laterality: Left;   CATARACT EXTRACTION W/PHACO Left 02/15/2014   Procedure: CATARACT EXTRACTION PHACO AND INTRAOCULAR LENS PLACEMENT (IOC);  Surgeon: Tonny Branch, MD;  Location: [facility];  Service: Ophthalmology;  Laterality: Left;  CDE 10.84   CATARACT EXTRACTION W/PHACO Right 12/14/2013   Procedure: CATARACT EXTRACTION PHACO AND INTRAOCULAR LENS PLACEMENT (IOC);  Surgeon: Tonny Branch, MD;  Location: [facility];  Service: Ophthalmology;  Laterality: Right;  CDE:  16.30   CHOLECYSTECTOMY  02/2013   EYE SURGERY     IR FLUORO GUIDE CV LINE RIGHT  01/05/2019   IR US GUIDE VASC ACCESS RIGHT  01/05/2019   LAPAROSCOPIC NEPHRECTOMY Right 01/08/2019   Procedure: LAPAROSCOPIC RADICAL NEPHRECTOMY;  Surgeon: Raynelle Bring, MD;  Location: [facility];  Service: Urology;  Laterality: Right;   LEFT HEART CATH AND CORS/GRAFTS ANGIOGRAPHY N/A 04/14/2021   Procedure: LEFT HEART CATH AND CORS/GRAFTS ANGIOGRAPHY;  Surgeon: Jolaine Artist, MD;  Location: [facility];  Service: Cardiovascular;  Laterality: N/A;   PENILE PROSTHESIS IMPLANT     PERIPHERAL VASCULAR INTERVENTION Left 04/19/2021   Procedure: PERIPHERAL VASCULAR INTERVENTION;  Surgeon: Cherre Robins, MD;  Location: [facility];  Service: Cardiovascular;  Laterality: Left;   STUMP REVISION Left 07/07/2021   Procedure: REVISION LEFT BELOW KNEE AMPUTATION;  Surgeon: Newt Minion, MD;  Location: [facility];  Service: Orthopedics;  Laterality: Left;   STUMP REVISION Left 07/28/2021   Procedure: REVISION LEFT BELOW KNEE AMPUTATION;  Surgeon: Newt Minion, MD;  Location: [facility];  Service: Orthopedics;  Laterality: Left;   TOE  AMPUTATION Right 2013   [facility]   Prior to Admission medications   Medication Sig Start Date End Date Taking? Authorizing Provider  aspirin EC 81 MG tablet Take 81 mg by mouth in the morning. Swallow whole.    [provider]  atorvastatin (LIPITOR) 80 MG tablet Take 1 tablet (80 mg total) by mouth daily. Patient taking differently: Take 80 mg by mouth 2 (two) times daily. 06/08/21   Love, Ivan Anchors, PA-C  AURYXIA 1 GM 210 MG(Fe) tablet Take 210-420 mg by mouth See admin instructions. Take 1 tablet by mouth with each home meal, and if eating out take 2 tablet with that meal 06/16/21   [provider]  blood glucose meter kit and supplies KIT Dispense based on patient and insurance preference. Use up to four times daily as directed. (FOR ICD-9 250.00, 250.01). 11/08/15   Cassandria Anger, MD  Blood Glucose Monitoring Suppl (BAYER CONTOUR MONITOR) w/Device KIT 1 each by Does not apply route 4 (four) times daily. Test 4 x daily 11/08/15   Cassandria Anger, MD  CALCIUM PO Take 1 tablet by mouth in the morning and at bedtime.    [provider]  clonazePAM (KLONOPIN) 0.5 MG tablet Take 0.5 mg by mouth 2 (two) times daily as needed for anxiety.    [provider]  clopidogrel (PLAVIX) 75 MG tablet Take 1 tablet (75 mg total) by mouth daily. Patient taking differently: Take 75 mg by mouth at bedtime. 06/08/21   Love, Ivan Anchors, PA-C  furosemide (LASIX) 80 MG tablet Take 1 tablet (80 mg total) by mouth 2 (two) times daily. 06/08/21   Love, Ivan Anchors, PA-C  gabapentin (NEURONTIN) 100 MG capsule Take 100 mg by mouth daily as needed (pain.).    [provider]  glucose blood (BAYER CONTOUR TEST) test strip Use as instructed 4 x daily 11/08/15   Cassandria Anger, MD  Insulin Glargine (BASAGLAR KWIKPEN) 100 UNIT/ML Inject 5 Units into the skin at bedtime. Patient taking differently: Inject 15 Units into the skin at bedtime. 04/20/21 07/27/23  Dessa Phi, DO  insulin lispro (HUMALOG) 100 UNIT/ML KwikPen Inject 0-10 Units into the skin 4 (four) times daily - after meals and at bedtime. Sliding Scale Insulin    [provider]  Insulin Pen Needle (B-D ULTRAFINE III SHORT PEN) 31G X 8 MM MISC 1 each by Does not apply route as directed. 09/02/15   Cassandria Anger, MD  Lancets MISC 1 each by Does not apply route 4 (four) times daily. 11/08/15   Cassandria Anger, MD  metaxalone (SKELAXIN) 400 MG tablet Take 1 tablet (400 mg total) by mouth 3 (three) times daily as needed for muscle spasms (please offer- pt forgets to ask). 06/08/21   Love, Ivan Anchors, PA-C  midodrine (PROAMATINE) 2.5 MG tablet Take 2.5 mg by mouth every Monday, Wednesday, and Friday with hemodialysis. 07/24/21   [provider]  multivitamin (RENA-VIT) TABS tablet Take 1 tablet by mouth at bedtime. 06/08/21   [name]  S, PA-C  nutrition supplement, JUVEN, (JUVEN) PACK Take 1 packet by mouth 2 (two) times daily between meals. 08/03/21   Persons, Bevely Palmer, PA  polyethylene glycol (MIRALAX / GLYCOLAX) 17 g packet Take 17 g by mouth daily as needed for mild constipation. 06/08/21   Love, Ivan Anchors, PA-C  QUEtiapine (SEROQUEL) 50 MG tablet TAKE 1 TABLET BY MOUTH EVERYDAY AT BEDTIME 07/04/21   Lovorn, Jinny Blossom, MD  traZODone (DESYREL) 50 MG tablet Take 50 mg by mouth at bedtime.    [provider]  venlafaxine XR (EFFEXOR-XR) 37.5 MG 24 hr capsule Take 37.5 mg by mouth every evening. 06/12/21   [provider]  zinc sulfate 220 (50 Zn) MG capsule Take 1 capsule (220 mg total) by mouth daily. Patient taking differently: Take 220 mg by mouth 2 (two) times daily. 05/18/21   Little Ishikawa, MD   Allergies  Allergen Reactions   Ambien [Zolpidem] Other (See Comments)    It causes pt to have insomnia, NOT sleep- idiosyncratic reaction    FAMILY HISTORY:  Family History  Problem Relation Age of Onset   Stroke Mother    Stroke Father    Cancer  Father    Cancer Brother    SOCIAL HISTORY:  reports that he has never smoked. He has never used smokeless tobacco. He reports current alcohol use. He reports that he does not use drugs.  REVIEW OF SYSTEMS:    Shortness of breath Pedal edema Muscle motor  Constitutional: Negative for fever, chills, weight loss, malaise/fatigue and diaphoresis.  HENT: Negative for hearing loss, ear pain, nosebleeds, congestion, sore throat, neck pain, tinnitus and ear discharge.   Eyes: Negative for blurred vision, double vision, photophobia, pain, discharge and redness.  Respiratory: Negative for cough, hemoptysis, sputum production,  wheezing and stridor.   Cardiovascular: Negative for chest pain, palpitations, orthopnea, claudication and PND.  Gastrointestinal: Negative for heartburn, nausea, vomiting, abdominal pain, diarrhea, constipation, blood in stool and melena.  Genitourinary: Negative for dysuria, urgency, frequency, hematuria and flank pain.  Musculoskeletal: Negative for myalgias, back pain, joint pain and falls.  Skin: Negative for itching and rash.  Neurological: Negative for dizziness, tingling, tremors, sensory change, speech change, focal weakness, seizures, loss of consciousness, weakness and headaches.  Endo/Heme/Allergies: Negative for environmental allergies and polydipsia. Does not bruise/bleed easily.  SUBJECTIVE:   VITAL SIGNS: Temp:  [97.6 F (36.4 C)-97.9 F (36.6 C)] 97.6 F (36.4 C) (10/12 1238) Pulse Rate:  [89-103] 103 (10/12 1238) Resp:  [16-17] 16 (10/12 1238) BP: (110-146)/(70-91) 110/75 (10/12 1238) SpO2:  [94 %-100 %] 100 % (10/12 1238)  PHYSICAL EXAMINATION: Gen. Pleasant, well-nourished, in no distress, depressed affect ENT - no lesions, no post nasal drip Neck: No JVD, no thyromegaly, no carotid bruits Lungs: no use of accessory muscles,  dullness to percussion BL bases, decreased bilateral without rales or rhonchi  Cardiovascular: Rhythm regular, heart  sounds  normal, ESM 3/6 at base 2+ peripheral edema Abdomen: soft and non-tender, no hepatosplenomegaly, BS normal. Musculoskeletal: Left BKA, no cyanosis or clubbing Neuro:  alert, non focal Skin:  Warm, no lesions/ rash   Recent Labs  Lab 08/10/21 0937 08/11/21 1509  NA 134* 130*  K 3.8 3.8  CL 95* 93*  CO2 29 27  BUN 31* 43*  CREATININE 3.97* 5.03*  GLUCOSE 94 104*   Recent Labs  Lab 08/11/21 1509  HGB 9.0*  HCT 30.2*  WBC 6.2  PLT 146*   DG Chest 2 View  Result  Date: 08/16/2021 CLINICAL DATA:  Short of breath EXAM: CHEST - 2 VIEW COMPARISON:  06/15/2021 FINDINGS: Postop CABG. Moderately large bilateral pleural effusions have progressed from the prior study. Progressive bibasilar atelectasis. Vascular congestion is present without definite edema. IMPRESSION: Progressive bilateral pleural effusions bibasilar atelectasis. Likely fluid overload. Electronically Signed   By: Franchot Gallo M.D.   On: 08/16/2021 12:55    ASSESSMENT / PLAN:  Bilateral pleural effusions, progressive. 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  Midodrine has been added and daily dialysis is planned  Recommend -Proceed with thoracentesis, bedside ultrasound shows bilateral pleural effusions significant. -Risks and benefits of the procedure including that of bleeding related to Plavix and lung biopsy requiring chest tube were discussed in detail with the patient and his wife and they agree to proceed.  I explained that relief of dyspnea may be temporary  -Suggest CHF service consult , but agree with initiating palliative conversation given limited  options for fluid removal  Kara Mead MD. FCCP. [facility] Pager [phone] If no response call [phone]    08/16/2021, 1:34 PM

## 2021-08-16 NOTE — Progress Notes (Signed)
PROGRESS NOTE   Subjective/Complaints: Maxwell Harmon is feeling short of breath this morning. CXR ordered, pulmonology consulted and there is plan for thoracotomy today He says he slept better last night  ROS: Denies nausea, insomnia improved   Objective:   No results found. No results for input(s): WBC, HGB, HCT, PLT in the last 72 hours.   No results for input(s): NA, K, CL, CO2, GLUCOSE, BUN, CREATININE, CALCIUM in the last 72 hours.   Intake/Output Summary (Last 24 hours) at 08/16/2021 1152 Last data filed at 08/16/2021 0745 Gross per 24 hour  Intake 280 ml  Output 400 ml  Net -120 ml          Physical Exam: Vital Signs Blood pressure 133/77, pulse 89, temperature 97.6 F (36.4 C), resp. rate 16, height 5\' 11"  (1.803 m), weight 123 kg, SpO2 100 %. Gen: no distress, normal appearing HEENT: oral mucosa pink and moist, NCAT Cardio: Reg rate Chest: normal effort, more labored breathing Abd: soft, non-distended Ext: no edema Psych: pleasant, normal affect    Musculoskeletal:        General: Normal range of motion.     Cervical back: Normal range of motion.     Comments: Left knuckles with multiple scabs. Multiple bruises on BUE. L-BKA as below.  Pes planus deformity right foot without skin breakdown.   Skin:    Coloration: Skin is pale.     Neurological:     Mental Status: awakens and follows commands.     Comments: Normal language, no focal cn findings. UE grossly 4+/5. RLE 3+ prox to 4+/5 distally, LLE 2/5 proximally and limited by pain. Decreased sensation to PP and LT right foot. +bilateral hand tremor   Assessment/Plan: 1. Functional deficits which require 3+ hours per day of interdisciplinary therapy in a comprehensive inpatient rehab setting. Physiatrist is providing close team supervision and 24 hour management of active medical problems listed below. Physiatrist and rehab team continue to assess  barriers to discharge/monitor patient progress toward functional and medical goals  Care Tool:  Bathing    Body parts bathed by patient: Front perineal area, Right upper leg, Left upper leg   Body parts bathed by helper: Buttocks     Bathing assist Assist Level: Minimal Assistance - Patient > 75%     Upper Body Dressing/Undressing Upper body dressing   What is the patient wearing?: Pull over shirt    Upper body assist Assist Level: Minimal Assistance - Patient > 75%    Lower Body Dressing/Undressing Lower body dressing      What is the patient wearing?: Pants     Lower body assist Assist for lower body dressing: Maximal Assistance - Patient 25 - 49% (lateral leans)     Toileting Toileting    Toileting assist Assist for toileting: Contact Guard/Touching assist     Transfers Chair/bed transfer  Transfers assist     Chair/bed transfer assist level: Contact Guard/Touching assist (SB)     Locomotion Ambulation   Ambulation assist   Ambulation activity did not occur: Safety/medical concerns (weakness, decreased balance, fear of falling)  Assist level: Moderate Assistance - Patient 50 - 74% Assistive device: Walker-rolling Max  distance: 2'   Walk 10 feet activity   Assist  Walk 10 feet activity did not occur: Safety/medical concerns (weakness, decreased balance, fear of falling)        Walk 50 feet activity   Assist Walk 50 feet with 2 turns activity did not occur: Safety/medical concerns (weakness, decreased balance, fear of falling)         Walk 150 feet activity   Assist Walk 150 feet activity did not occur: Safety/medical concerns (weakness, decreased balance, fear of falling)         Walk 10 feet on uneven surface  activity   Assist Walk 10 feet on uneven surfaces activity did not occur: Safety/medical concerns (weakness, decreased balance, fear of falling)         Wheelchair     Assist Is the patient using a  wheelchair?: Yes Type of Wheelchair: Manual    Wheelchair assist level: Supervision/Verbal cueing Max wheelchair distance: 65ft    Wheelchair 50 feet with 2 turns activity    Assist        Assist Level: Supervision/Verbal cueing   Wheelchair 150 feet activity     Assist      Assist Level: Maximal Assistance - Patient 25 - 49%   Blood pressure 133/77, pulse 89, temperature 97.6 F (36.4 C), resp. rate 16, height 5\' 11"  (1.803 m), weight 123 kg, SpO2 100 %.  Medical Problem List and Plan: 1.  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  Impaired mobility: continue Heparin added             -antiplatelet therapy: Continue ASA/Plavix 3. Pain: Decrease Diluadid to 0.5mg  q12H prn. Oxycodone prn.              d/c'ed gabapentin given asterixis 4. Anxiety: LCSW to follow for evaluation and support. Scheduled Klonopin .25mg  HS             -antipsychotic agents: N/A 5. Neuropsych: This patient is capable of making decisions on his own behalf. 6. Skin/Wound Care: Continue wound VAC for couple more days per ortho              --continue Vitamin C and Zinc with juven bid to promote wound healing.  7. Fluids/Electrolytes/Nutrition: Strict I/O. Routine check of labs with HD. 8. T2DM: Monitor BS ac/hs and use SSI for elevated BS             --Continue insulin Glargine with 2 units Novolog TID CBG (last 3)  Recent Labs    08/15/21 2148 08/16/21 0549 08/16/21 1141  GLUCAP 162* 124* 131*  Cbg's controlled -10/9: continue to hold semglee for now given sub-optimal po intake, novolog already stopped  9. ESRD: HD MWF at the end of the day to help with tolerance of therapy.              --on Sensipar and ferric citrate for metabolic bone disease.   -palliative care  consulted as per family request Increased midodrine to 10mg  TID so he can tolerate more ultrafiltration 10. Anemia of chronic disease: Started on weekly [name] 09/28.              --  routine H/H check with HD.  11. Hypotension: Increase midodrine to 10mg  TID so he can tolerate more ultrafiltration 12. Depression with acute reaction?: On Effexor with trazodone to help with sleep.              --Klonopin prn for anxiety.   -wife here for support  -team providing egosupport as well 13. CAD: On ASA/Plavix and Lipitor. Monitor for symptoms with increase in activity.  14.  PAD: Continue Trental TID.  15.  Constipation: had BM with SMOG enema and nausea has resolved 16. Nausea: Improved at present.  17. Bilateral hand tremor: unknown cause- ammonia normal. Appreciate nephrology and medicine following. D/c Gabapentin. Decrease oxycodone to 2.5mg  q4H prn 18. Decreased appetite: start megace- cleared with nephrology 19. Insomnia: d/c trazodone and replace with Rozarem  -advised pt to put on some music, back ground noise to help with falling asleep  -wife staying over helpful also 20. Lethargy: d/c klonopin 21. Shortness of breath: thoracotomy today      LOS: 13 days A FACE TO FACE EVALUATION WAS PERFORMED  Koy Lamp P Lexus Barletta 08/16/2021, 11:52 AM

## 2021-08-16 NOTE — Progress Notes (Signed)
Physical Therapy Note  Patient Details  Name: Maxwell Harmon MRN: [id number] Date of Birth: 05-Jan-1956 Today's Date: 08/16/2021  Attempted to see pt to make up missed therapy time. Received pt asleep in bed. Pt woke with verbal stimuli from RN but politely declined participating in therapy, ultimately requesting to rest. Suggested getting pt set up to eat lunch, however pt continued to decline. Assisted with repositioning in bed for comfort and left pt semi-reclined in bed asleep with all needs within reach.   McLeod PT, DPT   08/16/2021, 1:43 PM

## 2021-08-17 ENCOUNTER — Inpatient Hospital Stay (HOSPITAL_COMMUNITY): Payer: Managed Care, Other (non HMO)

## 2021-08-17 DIAGNOSIS — I5023 Acute on chronic systolic (congestive) heart failure: Secondary | ICD-10-CM

## 2021-08-17 DIAGNOSIS — J90 Pleural effusion, not elsewhere classified: Secondary | ICD-10-CM | POA: Diagnosis not present

## 2021-08-17 LAB — BODY FLUID CELL COUNT WITH DIFFERENTIAL
Eos, Fluid: 2 %
Lymphs, Fluid: 42 %
Monocyte-Macrophage-Serous Fluid: 45 % — ABNORMAL LOW (ref 50–90)
Neutrophil Count, Fluid: 11 % (ref 0–25)
Total Nucleated Cell Count, Fluid: 34 cu mm (ref 0–1000)

## 2021-08-17 LAB — PROTEIN, TOTAL: Total Protein: 5.8 g/dL — ABNORMAL LOW (ref 6.5–8.1)

## 2021-08-17 LAB — PROTEIN, PLEURAL OR PERITONEAL FLUID: Total protein, fluid: <3 g/dL

## 2021-08-17 LAB — GLUCOSE, CAPILLARY
Glucose-Capillary: 113 mg/dL — ABNORMAL HIGH (ref 70–99)
Glucose-Capillary: 151 mg/dL — ABNORMAL HIGH (ref 70–99)
Glucose-Capillary: 125 mg/dL — ABNORMAL HIGH (ref 70–99)
Glucose-Capillary: 172 mg/dL — ABNORMAL HIGH (ref 70–99)

## 2021-08-17 LAB — LACTATE DEHYDROGENASE, PLEURAL OR PERITONEAL FLUID: LD, Fluid: 46 U/L — ABNORMAL HIGH (ref 3–23)

## 2021-08-17 LAB — LACTATE DEHYDROGENASE: LDH: 125 U/L (ref 98–192)

## 2021-08-17 MED ORDER — SIMETHICONE 80 MG PO CHEW: 80.0000 mg | CHEWABLE_TABLET | Freq: Four times a day (QID) | ORAL | 0 refills | Status: DC | PRN

## 2021-08-17 MED ORDER — ATORVASTATIN CALCIUM 80 MG PO TABS: 40.0000 mg | ORAL_TABLET | Freq: Every day | ORAL | 0 refills | Status: DC

## 2021-08-17 MED ORDER — LINACLOTIDE 145 MCG PO CAPS
290.0000 ug | ORAL_CAPSULE | Freq: Every day | ORAL | Status: DC
  Administered 2021-08-18 – 2021-08-24 (×6): 290 ug via ORAL
  Filled 2021-08-17 (×7): qty 2

## 2021-08-17 NOTE — Progress Notes (Signed)
Physical Therapy Session Note  Patient Details  Name: Maxwell Harmon MRN: [id number] Date of Birth: 1956-09-16  Today's Date: 08/17/2021 PT Individual Time: 0916-1010 and 6834-1962 PT Individual Time Calculation (min): 54 min and 30 min PT Missed Time: 15 minutes PT Missed Time Reason: Fatigue   Short Term Goals: Week 1:  PT Short Term Goal 1 (Week 1): pt will transfer sit<>stand with LRAD and min A PT Short Term Goal 1 - Progress (Week 1): Progressing toward goal PT Short Term Goal 2 (Week 1): pt will transfer stand<>pivot with LRAD and min A PT Short Term Goal 2 - Progress (Week 1): Progressing toward goal PT Short Term Goal 3 (Week 1): pt will perform simulated car transfer with CGA PT Short Term Goal 3 - Progress (Week 1): Progressing toward goal Week 2:  PT Short Term Goal 1 (Week 2): pt will transfer sit<>stand with LRAD and min A PT Short Term Goal 2 (Week 2): pt will transfer stand<>pivot with LRAD and min A PT Short Term Goal 3 (Week 2): pt will perform simulated car transfer with CGA  Skilled Therapeutic Interventions/Progress Updates:   Treatment Session 1 Received pt semi-reclined in bed with daughter present at bedside, pt agreeable to PT treatment, and reported mild pain in R residual limb at end of session. RN notified and present to administer pain medication. Session with emphasis on functional mobility/transfers, generalized strengthening, dynamic standing balance/coordination, and improved endurance with activity. O2 sat 92% on RA and remained >90% throughout session. Donned limb guard with max A and transferred semi-reclined<>sitting EOB with HOB elevated and use of bedrails with min A for trunk control. Nephrology MD and another MD in/out during session and R residual limb inspected. Sit<>stand from elevated EOB with BUE support on RW and heavy mod A x 1 trial. Attempted with 1 UE on RW and 1 UE on bed, however pt unable to stand using this technique. Pt transferred  bed<>WC via lateral scoots with min A and transported to/from room in [employer] total A for time management purposes. Pt performed x 2 additional lateral scoots to/from WC with CGA/min A. Worked on static standing balance performing sit<>stands from extremely elevated mat with RW and mod A x 4 trials with min A for balance once upright. Pt able to remain standing ~10-15 seconds at a time prior to sitting to rest. Lowered mat minimally and stood x 1 additional trial prior to reporting 9.5/10 fatigue. Returned to room and concluded session with pt sitting in WC, needs within reach, and seatbelt alarm on. PA's present to perform thoracentesis of R lung.  Treatment Session 2 Received pt semi-reclined in bed with family present at bedside. Pt agreeable to PT treatment and denied any pain during session but reported fatigue from thoracentesis earlier today. Session with emphasis on generalized strengthening, and improved endurance with activity. O2 sat 93% on RA and remained >90% throughout session. Pt performed the following exercises semi-reclined in bed with supervision and verbal cues for technique: -SLR 2x12 on LLE and 2x10 on RLE -hip abduction with grn TB 2x10 -hip abduction without resistance 2x15 bilaterally with emphasis on ROM -glute squeezes 10x5 second isometric hold  -RLE heel slides x15 Pt reported his shrinker hadn't been changed in 2 days; therefore removed current shrinker with supervision and donned clean one with total A. Therapist washed dirty shrinker and laid flat to dry and educated pt/family member on shrinker care. Pt requested to "be done" reporting 7/10 fatigue. Concluded session with pt  semi-reclined in bed with needs within reach. 15 minutes missed of skilled physical therapy due to fatigue.   Therapy Documentation Precautions:  Precautions Precautions: Fall Precaution Comments: wound vac Required Braces or Orthoses: Other Brace Other Brace: limb guard on the  LLE Restrictions Weight Bearing Restrictions: No LLE Weight Bearing: Non weight bearing  Therapy/Group: Individual Therapy Alfonse Alpers PT, DPT   08/17/2021, 7:15 AM

## 2021-08-17 NOTE — Progress Notes (Signed)
Occupational Therapy Session Note  Patient Details  Name: Maxwell Harmon MRN: [id number] Date of Birth: [birth year]  Today's Date: 08/17/2021 OT Individual Time: 1975-8832 OT Individual Time Calculation (min): 47 min    Short Term Goals: Week 2:  OT Short Term Goal 1 (Week 2): Pt will perform LB dressing with MIN A OT Short Term Goal 2 (Week 2): Pt will complete MIN A squat pivot transfer to [facility] OT Short Term Goal 3 (Week 2): Pt will engage in self-care tasks for 5 minutes w/o requiring external O2 support OT Short Term Goal 4 (Week 2): Pt will complete sit > stand with min assist to decrease burden of care with LB dressing  Skilled Therapeutic Interventions/Progress Updates:  Pt greeted supine in bed  agreeable to OT intervention. Session focus on BUE therex to faciliate increased strength and to improve overall activity tolerance. Pt completed BUE therex as indicated below:                         X20 bicep curls with level 3 theraband  X20 shoulder flexion with level 3 theraband  X20 scapular protraction<>retraction with level 3 theraband  1 min of un weighted punches  Shoulder shrugs for 1 min  2x10 forward rows with pt instructed to place BUEs on bed rails and shift trunk forward and back with emphasis on isometric core strength.  Pt continues to present with decreased activity tolerance needing ~ 2 min rest break between each exercise. Encouraged pt to complete exercises 3x daily with pt verbalizing understanding.   pt left supine in bed with bed alarm activated and all needs within reach.    Therapy Documentation Precautions:  Precautions Precautions: Fall Precaution Comments: wound vac Required Braces or Orthoses: Other Brace Other Brace: limb guard on the LLE Restrictions Weight Bearing Restrictions: No LLE Weight Bearing: Non weight bearing  Pain: pt reports unrated general pain during session, offered rest breaks and repositioning as needed    Therapy/Group:  Individual Therapy  Precious Haws 08/17/2021, 3:51 PM

## 2021-08-17 NOTE — Progress Notes (Signed)
Assisted Dr Alva with bedside thoracentesis.  Patient tolerated well.  Awaiting PCXR. 

## 2021-08-17 NOTE — Progress Notes (Signed)
  [facility] Progress Note   Subjective: Seen in room. Feels better after thora yesterday, 1.5L removed. Tolerated HD yesterday net uf 2.5L. discussed w/ daughter at the bedside  Objective Vitals:   08/16/21 1747 08/16/21 1922 08/17/21 0500 08/17/21 0627  BP: 132/83 126/72  124/74  Pulse: 86 79  85  Resp: 15 17  16   Temp:  (!) 97.3 F (36.3 C)  98 F (36.7 C)  TempSrc:      SpO2: 99% 94%  99%  Weight:   122 kg   Height:         Additional Objective Labs: Basic Metabolic Panel: Recent Labs  Lab 08/11/21 1509  NA 130*  K 3.8  CL 93*  CO2 27  GLUCOSE 104*  BUN 43*  CREATININE 5.03*  CALCIUM 9.0  PHOS 3.1   CBC: Recent Labs  Lab 08/11/21 1509  WBC 6.2  HGB 9.0*  HCT 30.2*  MCV 106.7*  PLT 146*   Blood Culture    Component Value Date/Time   SDES PLEURAL FLUID 08/16/2021 1229   [place] RIGHT 08/16/2021 1229   CULT  08/16/2021 1229    NO GROWTH < 24 HOURS Performed at [facility], [place] [address] [place], [place] [address]    REPTSTATUS PENDING 08/16/2021 1229     Physical Exam General: nad Heart: RRR Lungs: cta bl Abdomen: soft non-tender  Extremities: L BKA Neuro: drowsy, awake with verbal stimuli Dialysis Access: LUE AVF +bruit  Medications:   vitamin C  1,000 mg Oral Daily   aspirin EC  81 mg Oral Daily   atorvastatin  40 mg Oral Daily   cinacalcet  30 mg Oral Weekly   clopidogrel  75 mg Oral Daily   darbepoetin ([name]) injection - DIALYSIS  200 mcg Intravenous Q Wed-HD   docusate sodium  200 mg Oral Daily   ferric citrate  420 mg Oral TID with meals   insulin aspart  0-6 Units Subcutaneous TID WC   linaclotide  145 mcg Oral QAC breakfast   midodrine  10 mg Oral TID WC   multivitamin  1 tablet Oral QHS   nutrition supplement (JUVEN)  1 packet Oral BID BM   pantoprazole  40 mg Oral Daily   ramelteon  8 mg Oral QHS   senna  2 tablet Oral Daily   venlafaxine XR  37.5 mg Oral QPM    Dialysis Orders:  MWF  [facility]  [place]   Assessment/Plan: 1.  Status post revision of left below-knee amputation: Admitted to inpatient rehabilitation unit. 2. B/L pleural effusion: pulm on board, s/p rt thora 10/13 w/ 1.5L removed, left sided thora today 3. ESRD: on HD MWF schedule 4. Anemia: Hemoglobin 9's, resumed [name]200 ug weekly while here on Wed's. 5. CKD-MBD: Calcium level acceptable with improving phosphorus levels on [place]. 6. Nutrition: Ongoing renal diet with protein supplementation and renal multivitamin.  Fluid restriction. 7. Hypertension/volume excess: With episodes of intradialytic hypotension-on midodrine 10 mg twice daily. Has some dependent edema which has not been affected by HD for the most part.  8. Asterixis - not uremic and NH3 is wnl. Avoid gabapentin/ lyrica and excess narcotics as these can cause asterixis in ESRD pts.  Gean Quint, MD [facility]

## 2021-08-17 NOTE — Progress Notes (Signed)
PROGRESS NOTE   Subjective/Complaints: Feeling much better after bilateral thoracentesis- greatly appreciate pulmonary assistance Slept much better last night with Rozarem- slept well between 9pm and 2am  ROS: Denies nausea, insomnia improved, shortness of breath improved   Objective:   DG Chest 2 View  Result Date: 08/16/2021 CLINICAL DATA:  Short of breath EXAM: CHEST - 2 VIEW COMPARISON:  06/15/2021 FINDINGS: Postop CABG. Moderately large bilateral pleural effusions have progressed from the prior study. Progressive bibasilar atelectasis. Vascular congestion is present without definite edema. IMPRESSION: Progressive bilateral pleural effusions bibasilar atelectasis. Likely fluid overload. Electronically Signed   By: Franchot Gallo M.D.   On: 08/16/2021 12:55   DG Chest Port 1 View  Result Date: 08/17/2021 CLINICAL DATA:  Status left-sided post thoracentesis. EXAM: PORTABLE CHEST 1 VIEW COMPARISON:  Chest x-ray 08/16/2021 FINDINGS: Interval reduction in size of the left-sided pleural effusion. A small effusion persists. Moderate overlying atelectasis. No left-sided thoracentesis. Persistent right pleural effusion and right lower lobe airspace consolidation. IMPRESSION: Status post left-sided thoracentesis with decrease in size of left pleural effusion. No pneumothorax. Electronically Signed   By: Marijo Sanes M.D.   On: 08/17/2021 12:19   DG Chest Port 1 View  Result Date: 08/16/2021 CLINICAL DATA:  Status post right thoracentesis. EXAM: PORTABLE CHEST 1 VIEW COMPARISON:  Same day. FINDINGS: No pneumothorax is noted status post right thoracentesis. Right pleural effusion is smaller compared to prior exam. Stable left pleural effusion. IMPRESSION: No pneumothorax status post right-sided thoracentesis. Electronically Signed   By: Marijo Conception M.D.   On: 08/16/2021 13:34   No results for input(s): WBC, HGB, HCT, PLT in the last 72  hours.   No results for input(s): NA, K, CL, CO2, GLUCOSE, BUN, CREATININE, CALCIUM in the last 72 hours.   Intake/Output Summary (Last 24 hours) at 08/17/2021 1252 Last data filed at 08/17/2021 0600 Gross per 24 hour  Intake 240 ml  Output 2500 ml  Net -2260 ml          Physical Exam: Vital Signs Blood pressure 119/71, pulse 94, temperature 98 F (36.7 C), resp. rate 15, height 5\' 11"  (1.803 m), weight 122 kg, SpO2 96 %. Gen: no distress, normal appearing HEENT: oral mucosa pink and moist, NCAT Cardio: Reg rate Chest: normal effort, normal rate of breathing Abd: soft, non-distended Ext: no edema Psych: pleasant, normal affect  Musculoskeletal:        General: Normal range of motion.     Cervical back: Normal range of motion.     Comments: Left knuckles with multiple scabs. Multiple bruises on BUE. L-BKA as below.  Pes planus deformity right foot without skin breakdown.   Skin:    Coloration: Skin is pale.     Neurological:     Mental Status: awakens and follows commands.     Comments: Normal language, no focal cn findings. UE grossly 4+/5. RLE 3+ prox to 4+/5 distally, LLE 2/5 proximally and limited by pain. Decreased sensation to PP and LT right foot. +bilateral hand tremor   Assessment/Plan: 1. Functional deficits which require 3+ hours per day of interdisciplinary therapy in a comprehensive inpatient rehab setting. Physiatrist is providing close  team supervision and 24 hour management of active medical problems listed below. Physiatrist and rehab team continue to assess barriers to discharge/monitor patient progress toward functional and medical goals  Care Tool:  Bathing    Body parts bathed by patient: Front perineal area, Right upper leg, Left upper leg   Body parts bathed by helper: Buttocks     Bathing assist Assist Level: Minimal Assistance - Patient > 75%     Upper Body Dressing/Undressing Upper body dressing   What is the patient wearing?:  Pull over shirt    Upper body assist Assist Level: Minimal Assistance - Patient > 75%    Lower Body Dressing/Undressing Lower body dressing      What is the patient wearing?: Pants     Lower body assist Assist for lower body dressing: Maximal Assistance - Patient 25 - 49% (supine rolling in bed)     Toileting Toileting    Toileting assist Assist for toileting: Contact Guard/Touching assist     Transfers Chair/bed transfer  Transfers assist     Chair/bed transfer assist level: Minimal Assistance - Patient > 75% (lateral scoot)     Locomotion Ambulation   Ambulation assist   Ambulation activity did not occur: Safety/medical concerns (weakness, decreased balance, fear of falling)  Assist level: Moderate Assistance - Patient 50 - 74% Assistive device: Walker-rolling Max distance: 2'   Walk 10 feet activity   Assist  Walk 10 feet activity did not occur: Safety/medical concerns (weakness, decreased balance, fear of falling)        Walk 50 feet activity   Assist Walk 50 feet with 2 turns activity did not occur: Safety/medical concerns (weakness, decreased balance, fear of falling)         Walk 150 feet activity   Assist Walk 150 feet activity did not occur: Safety/medical concerns (weakness, decreased balance, fear of falling)         Walk 10 feet on uneven surface  activity   Assist Walk 10 feet on uneven surfaces activity did not occur: Safety/medical concerns (weakness, decreased balance, fear of falling)         Wheelchair     Assist Is the patient using a wheelchair?: Yes Type of Wheelchair: Manual    Wheelchair assist level: Supervision/Verbal cueing Max wheelchair distance: 79ft    Wheelchair 50 feet with 2 turns activity    Assist        Assist Level: Supervision/Verbal cueing   Wheelchair 150 feet activity     Assist      Assist Level: Maximal Assistance - Patient 25 - 49%   Blood pressure 119/71,  pulse 94, temperature 98 F (36.7 C), resp. rate 15, height 5\' 11"  (1.803 m), weight 122 kg, SpO2 96 %.  Medical Problem List and Plan: 1.  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  Impaired mobility: continue Heparin added             -antiplatelet therapy: Continue ASA/Plavix 3. Pain: Decrease Diluadid to 0.5mg  q12H prn. Oxycodone prn.              d/c'ed gabapentin given asterixis 4. Anxiety: LCSW to follow  for evaluation and support. Scheduled Klonopin .25mg  HS             -antipsychotic agents: N/A 5. Neuropsych: This patient is capable of making decisions on his own behalf. 6. Skin/Wound Care: Continue wound VAC for couple more days per ortho              --continue Vitamin C and Zinc with juven bid to promote wound healing.  7. Fluids/Electrolytes/Nutrition: Strict I/O. Routine check of labs with HD. 8. T2DM: Monitor BS ac/hs and use SSI for elevated BS             --Continue insulin Glargine with 2 units Novolog TID CBG (last 3)  Recent Labs    08/16/21 2113 08/17/21 0630 08/17/21 1200  GLUCAP 139* 113* 151*  Cbg's controlled -10/9: continue to hold semglee for now given sub-optimal po intake, novolog already stopped  9. ESRD: HD MWF at the end of the day to help with tolerance of therapy.              --on Sensipar and ferric citrate for metabolic bone disease.   -palliative care consulted as per family request Increased midodrine to 10mg  TID so he can tolerate more ultrafiltration 10. Anemia of chronic disease: Started on weekly [name] 09/28.              --routine H/H check with HD.  11. Hypotension: Increase midodrine to 10mg  TID so he can tolerate more ultrafiltration 12. Depression with acute reaction?: Continue Effexor  -wife here for support  -team  providing egosupport as well 13. CAD: On ASA/Plavix and Lipitor. Monitor for symptoms with increase in activity.  14.  PAD: Continue Trental TID.  15.  Constipation: had BM with SMOG enema and nausea has resolved 16. Nausea: Improved at present.  17. Bilateral hand tremor: unknown cause- ammonia normal. Appreciate nephrology and medicine following. D/c Gabapentin. Decrease oxycodone to 2.5mg  q4H prn 18. Decreased appetite: continue megace- cleared with nephrology 19. Insomnia: d/c trazodone and replace with Rozarem. Improved. Discussed that sleeping between hours of 10pm and 2am are most important for restorative sleep  -advised pt to put on some music, back ground noise to help with falling asleep  -wife staying over helpful also 20. Lethargy: d/c klonopin. Improved. 21. Shortness of breath: improved after bilateral thoracotomy. Will consult CHF specialist.      LOS: 14 days A FACE TO FACE EVALUATION WAS PERFORMED  Martha Clan P Kayonna Lawniczak 08/17/2021, 12:52 PM

## 2021-08-17 NOTE — Progress Notes (Signed)
Patient ID: Maxwell Harmon, male   DOB: 09-26-1956, 65 y.o.   MRN: [id number]  Patient referral sent to [facility] [place], Waiting for follow up   Erlene Quan, [place]          Insurance [place], [place]

## 2021-08-17 NOTE — Progress Notes (Signed)
Patient ID: Maxwell Harmon, male   DOB: Jan 03, 1956, 65 y.o.   MRN: [id number]  [facility] referral sent to [facility]. Waiting on Follow up  P: [phone] F: [phone]

## 2021-08-17 NOTE — Discharge Instructions (Addendum)
Inpatient Rehab Discharge Instructions  PETE SCHNITZER Discharge date and time:  08/24/21  Activities/Precautions/ Functional Status: Activity: no lifting, driving, or strenuous exercise till cleared by MD Diet: diabetic diet and renal diet Limit fluids to 1200 cc/day Wound Care:  wash with dial soap, pat dry and apply shrinker.   Functional status:  ___ No restrictions     ___ Walk up steps independently _X__ 24/7 supervision/assistance   ___ Walk up steps with assistance ___ Intermittent supervision/assistance  ___ Bathe/dress independently ___ Walk with walker     _X__ Bathe/dress with assistance ___ Walk Independently    ___ Shower independently ___ Walk with assistance    ___ Shower with assistance _X__ No alcohol     ___ Return to work/school ________  COMMUNITY REFERRALS UPON DISCHARGE:    Home Health:   PT     OT                        Agency:[facility]  Phone: [phone]   Medical Equipment/Items Ordered: Transport Chair                                                  Agency/Supplier: [employer]    Special Instructions: Low salt diet--limit all fluids to 5 cups a day.   2. Use short acting insulin per below scale For blood sugars 120- 150 range use 1 units of insulin. For blood sugars 151 - 200 use 2 units of insulin.  For blood sugars 201 - 250 use 3 units  of insulin IF blood sugars start running over 200 contact Dr. Chauncey Reading for input on resuming long acting insulin.    My questions have been answered and I understand these instructions. I will adhere to these goals and the provided educational materials after my discharge from the hospital.  Patient/Caregiver Signature _______________________________ Date __________  Clinician Signature _______________________________________ Date __________  Please bring this form and your medication list with you to all your follow-up doctor's appointments.

## 2021-08-17 NOTE — Progress Notes (Signed)
Patient ID: Maxwell Harmon, male   DOB: 10/17/1956, 65 y.o.   MRN: [id number]  [facility] referral sent to [place]: [phone] F: [place], [place]

## 2021-08-17 NOTE — Procedures (Signed)
   Written informed consent was obtained from the patient Rapid response nurse was present at the bedside to monitor vital signs Timeout    Bedside ultrasound was used and point marked in the seventh intercostal space in the LEFT scapular line. Local anesthetic was used.  With sternal precautions, thoracentesis needle was inserted and 1.5 L of amber-yellow fluid removed and sent for cell count, chemistry and microbiology Patient tolerated procedure well, postprocedure ultrasound showed sliding lung and no evidence of pneumothorax. Chest x-ray pending Minimal bleeding was noted    Crispin Vogel V. Elsworth Soho MD [place]

## 2021-08-17 NOTE — Progress Notes (Signed)
Patient ID: [name], male   DOB: 12-01-1955, 65 y.o.   MRN: [id number]  [facility] referral sent to [facility]. Waiting for Follow up.  P: [phone] F: [place], [place]

## 2021-08-17 NOTE — Progress Notes (Signed)
Name: Maxwell Harmon MRN: [id number] DOB: [birth year]    ADMISSION DATE:  08/03/2021 CONSULTATION DATE:  08/17/2021   REFERRING MD :  Ranell Patrick, rehab MD  CHIEF COMPLAINT:  resp distress   HISTORY OF PRESENT ILLNESS: 65 year old man with ESRD on hemodialysis, ischemic cardiomyopathy with EF of 30%, left BKA revision who was in rehab.  PCCM consulted for bilateral pleural effusions, difficult volume removal with dialysis. He has severe PAD.  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  He had wound dehiscence and was readmitted 07/27/2021 for revision of left BKA.  He had a complicated postoperative course with hypertension, volume overload, pain and depression with hypoxia.  Transferred to [name] on 9/29. He is followed by renal.  In spite of dialysis he has bilateral effusions and pedal edema and persistent shortness of breath.  He is on oxycodone for pain relief, this was decreased due to asterixis and clonazepam resumed. He is sleepy this morning and reports mild shortness of breath not relieved by oxygen.  He denies chest pain or wheezing.  He is a lifetime never smoker  PAST MEDICAL HISTORY :  Right nephrectomy for renal cancer Left BKA, revision Diabetes type 2 ESRD on dialysis Severe PAD CABG 2021 05/2021 echo showing EF of 30% 04/2021 LHC >> severe CAD, no targets for revascularization  Significant tests/ events reviewed 10/12 RT thora >> 1.5 L   SUBJECTIVE:  Breathing much improved No chest pain Afebrile  VITAL SIGNS: Temp:  [97 F (36.1 C)-98 F (36.7 C)] 98 F (36.7 C) (10/13 0627) Pulse Rate:  [79-103] 85 (10/13 0627) Resp:  [15-22] 16 (10/13 0627) BP: (110-149)/(72-83) 124/74 (10/13 0627) SpO2:  [94 %-100 %] 99 % (10/13 0627) Weight:  [122 kg] 122 kg (10/13 0500)  PHYSICAL EXAMINATION: Gen. Pleasant, well-nourished, in no distress, appears more alert today ENT - no  lesions, no post nasal drip Neck: No JVD, no thyromegaly, no carotid bruits Lungs: No accessory muscle use, dull to percussion both bases, decreased breath sounds bilateral Cardiovascular: S1-S2 regular, ESM 3/6 at base Abdomen: soft and non-tender, no hepatosplenomegaly, BS normal. Musculoskeletal: Left BKA, no cyanosis or clubbing, 1+ edema Neuro:  alert, non focal Skin:  Warm, no lesions/ rash  Pleural fluid shows low LDH and protein, only 65 white cells   Recent Labs  Lab 08/10/21 0937 08/11/21 1509  NA 134* 130*  K 3.8 3.8  CL 95* 93*  CO2 29 27  BUN 31* 43*  CREATININE 3.97* 5.03*  GLUCOSE 94 104*    Recent Labs  Lab 08/11/21 1509  HGB 9.0*  HCT 30.2*  WBC 6.2  PLT 146*    DG Chest 2 View  Result Date: 08/16/2021 CLINICAL DATA:  Short of breath EXAM: CHEST - 2 VIEW COMPARISON:  06/15/2021 FINDINGS: Postop CABG. Moderately large bilateral pleural effusions have progressed from the prior study. Progressive bibasilar atelectasis. Vascular congestion is present without definite edema. IMPRESSION: Progressive bilateral pleural effusions bibasilar atelectasis. Likely fluid overload. Electronically Signed   By: Franchot Gallo M.D.   On: 08/16/2021 12:55   DG Chest Port 1 View  Result Date: 08/16/2021 CLINICAL DATA:  Status post right thoracentesis. EXAM: PORTABLE CHEST 1 VIEW COMPARISON:  Same day. FINDINGS: No pneumothorax is noted status post right thoracentesis. Right pleural effusion is smaller compared to prior exam. Stable left pleural effusion. IMPRESSION: No pneumothorax status post right-sided thoracentesis. Electronically Signed  By: Marijo Conception M.D.   On: 08/16/2021 13:34    ASSESSMENT / PLAN:  Bilateral pleural effusions, progressive. - related to HFrEf/acute systolic heart failure and inability to remove fluid during dialysis due to hypotension.   -Status post 1.5 L removal  Recommend -He is willing to proceed with left-sided thoracentesis today  since he had significant relief yesterday  -Suggest CHF service consult , but agree with initiating palliative conversation given limited  options for fluid removal  Kara Mead MD. FCCP. [facility] Pager [phone] If no response call [phone]    08/17/2021, 9:26 AM

## 2021-08-17 NOTE — Consult Note (Addendum)
Advanced Heart Failure Team Consult Note   Primary Physician: [name], DO PCP-Cardiologist:  None  Reason for Consultation: Heart Failure   HPI:    Maxwell Harmon is seen today for evaluation of heart failure at the request of Dr. Elsworth Soho.   Maxwell Harmon is a 65 year old male (son in law of Dr Ninfa Linden) with a history of DM, PAD, charcot R foot, CAD, S/P CABG 2021, ESRD (one kidney removed for malignancy), previously on PD--> iHD, and LBKA.    Followed by [facility] for renal transplant.  At [facility] he was noted to have an EF of 38-42% on a myoview study in 12/2020 which also showed a small area of reversible ischemia, however a stress echo in 02/2021 showed an EF of ~55%.    Previously on PD but switched to iHD. Developed AMS/weakness associated with orthopnea and dyspnea.    Admitted  05/2021 with LLE wound and ultimately had L BKA. Discharged [name].   Admitted 07/27/21 for revision LBKA . Given IV antibiotics. Discharged to [name] on 08/03/21. Continued on dialysis.  Unfortunately developed with SOB. CXR with pleural effusions. Had right thoracentesis 10/12 with 1.5 liters removed and left thoracentesis today with 1.5 liters. Having difficulty with hypotension. Midodrine increased to 10 mg three times a day.   Cardiac Testing  05/15/2021 Echo EF 30-35% (previous EF was 50%) pattern on echo suggestive of possible Tako-Tsubo  04/14/21 LHC with severe CAD and no options for revascularization  1. LM ok 2. LAD 100% prox 3. LCX 95% prox 4. RCA 100% prox 5. LIMA to LAD widely patent but LAD is occluded immediately after LIMA plugs in 6. Sequential SVG to OM1 & OM2. Tortuous graft with mild narrowing at insertion to OM1 but otherwise OK. OMs are small  Review of Systems: [y] = yes, _0  = no   General: Weight gain _1 ; Weight loss _2 ; Anorexia _3 ; Fatigue _4 ; Fever _5 ; Chills _6 ; Weakness _7   Cardiac: Chest pain/pressure _8 ; Resting SOB _9 ; Exertional SOB [ Y]; Orthopnea _10 ; Pedal  Edema [ y]; Palpitations _11 ; Syncope _12 ; Presyncope _13 ; Paroxysmal nocturnal dyspnea_14   Pulmonary: Cough _15 ; Wheezing_16 ; Hemoptysis_17 ; Sputum _18 ; Snoring _19   GI: Vomiting_20 ; Dysphagia_21 ; Melena_22 ; Hematochezia _23 ; Heartburn_24 ; Abdominal pain _25 ; Constipation _26 ; Diarrhea _27 ; BRBPR _28   GU: Hematuria_29 ; Dysuria _30 ; Nocturia_31   Vascular: Pain in legs with walking [ Y]; Pain in feet with lying flat _32 ; Non-healing sores [ Y]; Stroke _33 ; TIA _34 ; Slurred speech _35 ;  Neuro: Headaches_36 ; Vertigo_37 ; Seizures_38 ; Paresthesias_39 ;Blurred vision _40 ; Diplopia _41 ; Vision changes _42   Ortho/Skin: Arthritis [ y]; Joint pain [ Y]; Muscle pain _43 ; Joint swelling _44 ; Back Pain [ Y]; Rash _45   Psych: Depression_46 ; Anxiety_47   Heme: Bleeding problems _48 ; Clotting disorders _49 ; Anemia _50   Endocrine: Diabetes [Y ]; Thyroid dysfunction_51   Home Medications Prior to Admission medications   Medication Sig Start Date End Date Taking? Authorizing Provider  aspirin EC 81 MG tablet Take 81 mg by mouth in the morning. Swallow whole.    [provider]  atorvastatin (LIPITOR) 80 MG tablet Take 0.5 tablets (40 mg total) by mouth daily. 08/17/21   Love, Ivan Anchors, PA-C  AURYXIA 1 GM 210  MG(Fe) tablet Take 210-420 mg by mouth See admin instructions. Take 1 tablet by mouth with each home meal, and if eating out take 2 tablet with that meal 06/16/21   [provider]  blood glucose meter kit and supplies KIT Dispense based on patient and insurance preference. Use up to four times daily as directed. (FOR ICD-9 250.00, 250.01). 11/08/15   Cassandria Anger, MD  Blood Glucose Monitoring Suppl (BAYER CONTOUR MONITOR) w/Device KIT 1 each by Does not apply route 4 (four) times daily. Test 4 x daily 11/08/15   Cassandria Anger, MD  CALCIUM PO Take 1 tablet by mouth in the morning and at bedtime.    [provider]  clonazePAM (KLONOPIN) 0.5 MG tablet Take 0.5 mg by mouth 2  (two) times daily as needed for anxiety.    [provider]  clopidogrel (PLAVIX) 75 MG tablet Take 1 tablet (75 mg total) by mouth daily. Patient taking differently: Take 75 mg by mouth at bedtime. 06/08/21   Love, Ivan Anchors, PA-C  furosemide (LASIX) 80 MG tablet Take 1 tablet (80 mg total) by mouth 2 (two) times daily. 06/08/21   Love, Ivan Anchors, PA-C  gabapentin (NEURONTIN) 100 MG capsule Take 100 mg by mouth daily as needed (pain.).    [provider]  glucose blood (BAYER CONTOUR TEST) test strip Use as instructed 4 x daily 11/08/15   Cassandria Anger, MD  Insulin Glargine (BASAGLAR KWIKPEN) 100 UNIT/ML Inject 5 Units into the skin at bedtime. Patient taking differently: Inject 15 Units into the skin at bedtime. 04/20/21 07/27/23  Dessa Phi, DO  insulin lispro (HUMALOG) 100 UNIT/ML KwikPen Inject 0-10 Units into the skin 4 (four) times daily - after meals and at bedtime. Sliding Scale Insulin    [provider]  Insulin Pen Needle (B-D ULTRAFINE III SHORT PEN) 31G X 8 MM MISC 1 each by Does not apply route as directed. 09/02/15   Cassandria Anger, MD  Lancets MISC 1 each by Does not apply route 4 (four) times daily. 11/08/15   Cassandria Anger, MD  metaxalone (SKELAXIN) 400 MG tablet Take 1 tablet (400 mg total) by mouth 3 (three) times daily as needed for muscle spasms (please offer- pt forgets to ask). 06/08/21   Love, Ivan Anchors, PA-C  midodrine (PROAMATINE) 2.5 MG tablet Take 2.5 mg by mouth every Monday, Wednesday, and Friday with hemodialysis. 07/24/21   [provider]  multivitamin (RENA-VIT) TABS tablet Take 1 tablet by mouth at bedtime. 06/08/21   Love, Ivan Anchors, PA-C  nutrition supplement, JUVEN, (JUVEN) PACK Take 1 packet by mouth 2 (two) times daily between meals. 08/03/21   Persons, Bevely Palmer, PA  polyethylene glycol (MIRALAX / GLYCOLAX) 17 g packet Take 17 g by mouth daily as needed for mild constipation. 06/08/21   Love, Ivan Anchors, PA-C   QUEtiapine (SEROQUEL) 50 MG tablet TAKE 1 TABLET BY MOUTH EVERYDAY AT BEDTIME 07/04/21   Lovorn, Jinny Blossom, MD  simethicone (MYLICON) 80 MG chewable tablet Chew 1 tablet (80 mg total) by mouth 4 (four) times daily as needed for flatulence. 08/17/21   Love, Ivan Anchors, PA-C  traZODone (DESYREL) 50 MG tablet Take 50 mg by mouth at bedtime.    [provider]  venlafaxine XR (EFFEXOR-XR) 37.5 MG 24 hr capsule Take 37.5 mg by mouth every evening. 06/12/21   [provider]  zinc sulfate 220 (50 Zn) MG capsule Take 1 capsule (220 mg total) by mouth daily.  Patient taking differently: Take 220 mg by mouth 2 (two) times daily. 05/18/21   Little Ishikawa, MD    Past Medical History: Past Medical History:  Diagnosis Date   Anemia of chronic disease    Anxiety    CAD (coronary artery disease)    Depression    Diabetes mellitus without complication ([place])    ESRD (end stage renal disease) on dialysis ([place])    MWF in [place]   History of bleeding ulcers    History of TIAs    Hypertension    PAD (peripheral artery disease) ([facility])    Renal cancer, right ([place])    s/p right radical nephrectomy 01/08/19    Past Surgical History: Past Surgical History:  Procedure Laterality Date   ABDOMINAL AORTOGRAM W/LOWER EXTREMITY Bilateral 04/14/2021   Procedure: ABDOMINAL AORTOGRAM W/LOWER EXTREMITY;  Surgeon: Elam Dutch, MD;  Location: [facility];  Service: Vascular;  Laterality: Bilateral;   AMPUTATION Left 05/12/2021   Procedure: LEFT BELOW KNEE AMPUTATION;  Surgeon: Newt Minion, MD;  Location: [place];  Service: Orthopedics;  Laterality: Left;   CATARACT EXTRACTION W/PHACO Left 02/15/2014   Procedure: CATARACT EXTRACTION PHACO AND INTRAOCULAR LENS PLACEMENT (IOC);  Surgeon: Tonny Branch, MD;  Location: [facility];  Service: Ophthalmology;  Laterality: Left;  CDE 10.84   CATARACT EXTRACTION W/PHACO Right 12/14/2013   Procedure: CATARACT EXTRACTION PHACO AND INTRAOCULAR LENS PLACEMENT  (IOC);  Surgeon: Tonny Branch, MD;  Location: [facility];  Service: Ophthalmology;  Laterality: Right;  CDE:  16.30   CHOLECYSTECTOMY  02/2013   EYE SURGERY     IR FLUORO GUIDE CV LINE RIGHT  01/05/2019   IR US GUIDE VASC ACCESS RIGHT  01/05/2019   LAPAROSCOPIC NEPHRECTOMY Right 01/08/2019   Procedure: LAPAROSCOPIC RADICAL NEPHRECTOMY;  Surgeon: Raynelle Bring, MD;  Location: [facility];  Service: Urology;  Laterality: Right;   LEFT HEART CATH AND CORS/GRAFTS ANGIOGRAPHY N/A 04/14/2021   Procedure: LEFT HEART CATH AND CORS/GRAFTS ANGIOGRAPHY;  Surgeon: Jolaine Artist, MD;  Location: [facility];  Service: Cardiovascular;  Laterality: N/A;   PENILE PROSTHESIS IMPLANT     PERIPHERAL VASCULAR INTERVENTION Left 04/19/2021   Procedure: PERIPHERAL VASCULAR INTERVENTION;  Surgeon: Cherre Robins, MD;  Location: [facility];  Service: Cardiovascular;  Laterality: Left;   STUMP REVISION Left 07/07/2021   Procedure: REVISION LEFT BELOW KNEE AMPUTATION;  Surgeon: Newt Minion, MD;  Location: [place];  Service: Orthopedics;  Laterality: Left;   STUMP REVISION Left 07/28/2021   Procedure: REVISION LEFT BELOW KNEE AMPUTATION;  Surgeon: Newt Minion, MD;  Location: [place];  Service: Orthopedics;  Laterality: Left;   TOE AMPUTATION Right 2013   [facility]    Family History: Family History  Problem Relation Age of Onset   Stroke Mother    Stroke Father    Cancer Father    Cancer Brother     Social History: Social History   Socioeconomic History   Marital status: Married    Spouse name: Not on file   Number of children: Not on file   Years of education: Not on file   Highest education level: Not on file  Occupational History   Not on file  Tobacco Use   Smoking status: Never   Smokeless tobacco: Never  Vaping Use   Vaping Use: Never used  Substance and Sexual Activity   Alcohol use: Yes    Comment: social drink    Drug  use: No   Sexual activity: Not Currently  Other  Topics Concern   Not on file  Social History Narrative   Not on file   Social Determinants of Health   Financial Resource Strain: Low Risk    Difficulty of Paying Living Expenses: Not very hard  Food Insecurity: No Food Insecurity   Worried About Running Out of Food in the Last Year: Never true   Ran Out of Food in the Last Year: Never true  Transportation Needs: No Transportation Needs   Lack of Transportation (Medical): No   Lack of Transportation (Non-Medical): No  Physical Activity: Not on file  Stress: Not on file  Social Connections: Not on file    Allergies:  Allergies  Allergen Reactions   Ambien [Zolpidem] Other (See Comments)    It causes pt to have insomnia, NOT sleep- idiosyncratic reaction    Objective:    Vital Signs:   Temp:  [97.3 F (36.3 C)-98.4 F (36.9 C)] 98.4 F (36.9 C) (10/13 1438) Pulse Rate:  [79-99] 89 (10/13 1438) Resp:  [15-22] 16 (10/13 1438) BP: (119-149)/(71-83) 131/72 (10/13 1438) SpO2:  [90 %-100 %] 90 % (10/13 1438) Weight:  [122 kg] 122 kg (10/13 0500) Last BM Date: 08/15/21  Weight change: Filed Weights   08/14/21 0500 08/15/21 0500 08/17/21 0500  Weight: 124 kg 123 kg 122 kg    Intake/Output:   Intake/Output Summary (Last 24 hours) at 08/17/2021 1621 Last data filed at 08/17/2021 1357 Gross per 24 hour  Intake 476 ml  Output 2500 ml  Net -2024 ml      Physical Exam    General:   No resp difficulty HEENT: normal Neck: supple. JVP 11-12 . Carotids 2+ bilat; no bruits. No lymphadenopathy or thyromegaly appreciated. Cor: PMI nondisplaced. Regular rate & rhythm. No rubs, gallops or murmurs. Lungs: clear Abdomen: soft, nontender, nondistended. No hepatosplenomegaly. No bruits or masses. Good bowel sounds. Extremities: no cyanosis, clubbing, rash, R and LLE 1+ edema. LBKA Neuro: alert & orientedx3, cranial nerves grossly intact. moves all 4 extremities w/o difficulty. Affect pleasant   Telemetry   N/A  EKG     N/A   Labs   Basic Metabolic Panel: Recent Labs  Lab 08/11/21 1509  NA 130*  K 3.8  CL 93*  CO2 27  GLUCOSE 104*  BUN 43*  CREATININE 5.03*  CALCIUM 9.0  PHOS 3.1    Liver Function Tests: Recent Labs  Lab 08/11/21 1242 08/11/21 1509 08/16/21 1230 08/17/21 1137  AST 21  --   --   --   ALT 11  --   --   --   ALKPHOS 108  --   --   --   BILITOT 0.8  --   --   --   PROT 5.8*  --  5.6* 5.8*  ALBUMIN 2.7* 2.7*  --   --    No results for input(s): LIPASE, AMYLASE in the last 168 hours. No results for input(s): AMMONIA in the last 168 hours.  CBC: Recent Labs  Lab 08/11/21 1509  WBC 6.2  HGB 9.0*  HCT 30.2*  MCV 106.7*  PLT 146*    Cardiac Enzymes: No results for input(s): CKTOTAL, CKMB, CKMBINDEX, TROPONINI in the last 168 hours.  BNP: BNP (last 3 results) Recent Labs    04/10/21 1033 06/16/21 0505  BNP 4,239.3* >4,500.0*    ProBNP (last 3 results) No results for input(s): PROBNP in the last 8760 hours.   CBG: Recent  Labs  Lab 08/16/21 0549 08/16/21 1141 08/16/21 2113 08/17/21 0630 08/17/21 1200  GLUCAP 124* 131* 139* 113* 151*    Coagulation Studies: No results for input(s): LABPROT, INR in the last 72 hours.   Imaging   DG Chest Port 1 View  Result Date: 08/17/2021 CLINICAL DATA:  Status left-sided post thoracentesis. EXAM: PORTABLE CHEST 1 VIEW COMPARISON:  Chest x-ray 08/16/2021 FINDINGS: Interval reduction in size of the left-sided pleural effusion. A small effusion persists. Moderate overlying atelectasis. No left-sided thoracentesis. Persistent right pleural effusion and right lower lobe airspace consolidation. IMPRESSION: Status post left-sided thoracentesis with decrease in size of left pleural effusion. No pneumothorax. Electronically Signed   By: Marijo Sanes M.D.   On: 08/17/2021 12:19     Medications:     Current Medications:  vitamin C  1,000 mg Oral Daily   aspirin EC  81 mg Oral Daily   atorvastatin  40 mg Oral  Daily   cinacalcet  30 mg Oral Weekly   clopidogrel  75 mg Oral Daily   darbepoetin ([name]) injection - DIALYSIS  200 mcg Intravenous Q Wed-HD   docusate sodium  200 mg Oral Daily   ferric citrate  420 mg Oral TID with meals   insulin aspart  0-6 Units Subcutaneous TID WC   [START ON 08/18/2021] linaclotide  290 mcg Oral QAC breakfast   midodrine  10 mg Oral TID WC   multivitamin  1 tablet Oral QHS   nutrition supplement (JUVEN)  1 packet Oral BID BM   pantoprazole  40 mg Oral Daily   ramelteon  8 mg Oral QHS   senna  2 tablet Oral Daily   venlafaxine XR  37.5 mg Oral QPM    Infusions:     Patient Profile   Maxwell Boquet is a 77 year old son in law of Dr Ninfa Linden) with a history of DM, PAD, charcot r foot, CAD, S/P CABG 2021, ESRD (one kidney removed for malignancy), previously on PD--> iHD, and LBKA.   Admitted to [name] after BKA revision. Developed increasing dyspnea. S/P Thoracentesis.   Assessment/Plan   1. Bilateral pleural effusions CCM -->S/P Bilateral R/L Thoracentesis with 1.5 liters removed off R/Lon  1.5 liters removed.  ? May need consider pleurex catheter.   2. S/P BKA Revision , PAD  3. Acute on Chronic Systolic Heart Failure -Echo 05/2021 EF 25-30%  -Volume status managed HD. Midodrine increased to 10 mg tid.  -No bb with hypotension   4. ESRD -On HD . Still with volume on board.   5. CAD -H/O  CABG 2021 - Cath 6/22 with severe CAD (as per HPI). No revasc options - On aspirin, statin, plavix.  - No chest pain.    Length of Stay: [place], NP  08/17/2021, 4:21 PM  Advanced Heart Failure Team Pager [phone] (M-F; 7a - 5p)  Please contact [facility] for night-coverage after hours (4p -7a ) and weekends on [email or web address]  Patient seen and examined with the above-signed Advanced Practice Provider and/or Housestaff. I personally reviewed laboratory data, imaging studies and relevant notes. I independently examined the patient and formulated the  important aspects of the plan. I have edited the note to reflect any of my changes or salient points. I have personally discussed the plan with the patient and/or family.  65 y/o male with ESRD, PAD s/p L BKA, CAD s/p CABG and systolic HF due to iCM.  Several recent admits for HF and PAD. Now in  [name] after BKA revision.   Anuric. As outpatient has been struggling to manage volume and his nephrologist has proposed going to 4x/week vs having longer sessions. Has been on midodrine for BP support.  During this admit has had bilateral thoracenteses with > 1L off each side.Still with residual effusions on CXR but breathing much better.   General:  Sitting up in bed. No resp difficulty HEENT: normal Neck: supple. JVP to jaw  Carotids 2+ bilat; no bruits. No lymphadenopathy or thryomegaly appreciated. Cor: PMI nondisplaced. Regular rate & rhythm. No rubs, gallops or murmurs. Lungs: decreased in bases Abdomen: soft, nontender, nondistended. No hepatosplenomegaly. No bruits or masses. Good bowel sounds. Extremities: no cyanosis, clubbing, rash, 2+ edema on right. S/p L BKA  Neuro: alert & orientedx3, cranial nerves grossly intact. moves all 4 extremities w/o difficulty. Affect pleasant  Given ESRD and hypotension little to offer him from a HF perspective at this point. Cath films reviewed and has no good targets for revascularization. Can increase milrinone as needed to support increased fluid removal with HD. If pleural effusions persist can consider palliative Pleurex tubes. D/w Dr. Elsworth Soho.   Glori Bickers, MD  11:24 PM

## 2021-08-17 NOTE — Progress Notes (Signed)
Patient ID: Maxwell Harmon, male   DOB: 1956-03-26, 65 y.o.   MRN: [id number]  Referral sent to [facility]. Waiting on follow up  P: [phone] F: [phone]

## 2021-08-18 ENCOUNTER — Inpatient Hospital Stay (HOSPITAL_COMMUNITY): Payer: Managed Care, Other (non HMO)

## 2021-08-18 DIAGNOSIS — J90 Pleural effusion, not elsewhere classified: Secondary | ICD-10-CM | POA: Diagnosis not present

## 2021-08-18 LAB — GLUCOSE, CAPILLARY
Glucose-Capillary: 146 mg/dL — ABNORMAL HIGH (ref 70–99)
Glucose-Capillary: 159 mg/dL — ABNORMAL HIGH (ref 70–99)
Glucose-Capillary: 96 mg/dL (ref 70–99)
Glucose-Capillary: 119 mg/dL — ABNORMAL HIGH (ref 70–99)

## 2021-08-18 LAB — RENAL FUNCTION PANEL
Albumin: 2.7 g/dL — ABNORMAL LOW (ref 3.5–5.0)
Anion gap: 11 (ref 5–15)
BUN: 62 mg/dL — ABNORMAL HIGH (ref 8–23)
CO2: 24 mmol/L (ref 22–32)
Calcium: 9 mg/dL (ref 8.9–10.3)
Chloride: 97 mmol/L — ABNORMAL LOW (ref 98–111)
Creatinine, Ser: 5.66 mg/dL — ABNORMAL HIGH (ref 0.61–1.24)
GFR, Estimated: 10 mL/min — ABNORMAL LOW (ref >60)
Glucose, Bld: 141 mg/dL — ABNORMAL HIGH (ref 70–99)
Phosphorus: 1.5 mg/dL — ABNORMAL LOW (ref 2.5–4.6)
Potassium: 4.1 mmol/L (ref 3.5–5.1)
Sodium: 132 mmol/L — ABNORMAL LOW (ref 135–145)

## 2021-08-18 LAB — CBC
HCT: 33.2 % — ABNORMAL LOW (ref 39.0–52.0)
Hemoglobin: 10.1 g/dL — ABNORMAL LOW (ref 13.0–17.0)
MCH: 32.5 pg (ref 26.0–34.0)
MCHC: 30.4 g/dL (ref 30.0–36.0)
MCV: 106.8 fL — ABNORMAL HIGH (ref 80.0–100.0)
Platelets: 161 10*3/uL (ref 150–400)
RBC: 3.11 MIL/uL — ABNORMAL LOW (ref 4.22–5.81)
RDW: 19.2 % — ABNORMAL HIGH (ref 11.5–15.5)
WBC: 9.1 10*3/uL (ref 4.0–10.5)
nRBC: 0 % (ref 0.0–0.2)

## 2021-08-18 MED ORDER — DOCUSATE SODIUM 100 MG PO CAPS
100.0000 mg | ORAL_CAPSULE | Freq: Three times a day (TID) | ORAL | Status: DC
  Administered 2021-08-18 – 2021-08-19 (×3): 100 mg via ORAL
  Filled 2021-08-18 (×3): qty 1

## 2021-08-18 MED ORDER — MELATONIN 3 MG PO TABS
3.0000 mg | ORAL_TABLET | Freq: Once | ORAL | Status: AC
  Administered 2021-08-18: 3 mg via ORAL
  Filled 2021-08-18: qty 1

## 2021-08-18 MED ORDER — MELATONIN 3 MG PO TABS: 3.0000 mg | ORAL_TABLET | Freq: Every day | ORAL | Status: DC

## 2021-08-18 MED ORDER — TRAZODONE HCL 50 MG PO TABS
50.0000 mg | ORAL_TABLET | Freq: Every day | ORAL | Status: DC
  Administered 2021-08-18 – 2021-08-23 (×6): 50 mg via ORAL
  Filled 2021-08-18 (×6): qty 1

## 2021-08-18 MED ORDER — HYDROMORPHONE HCL 1 MG/ML IJ SOLN: 0.5000 mg | Freq: Every day | INTRAMUSCULAR | Status: DC | PRN

## 2021-08-18 NOTE — Progress Notes (Signed)
PROGRESS NOTE   Subjective/Complaints: Patient's chart reviewed- No issues reported overnight Vitals signs stable except for elevated systolic BP Appreciate nephro eval today and heart failure eval yesterday  ROS: Denies nausea, insomnia improved, shortness of breath improved   Objective:   DG Chest Port 1 View  Result Date: 08/17/2021 CLINICAL DATA:  Status left-sided post thoracentesis. EXAM: PORTABLE CHEST 1 VIEW COMPARISON:  Chest x-ray 08/16/2021 FINDINGS: Interval reduction in size of the left-sided pleural effusion. A small effusion persists. Moderate overlying atelectasis. No left-sided thoracentesis. Persistent right pleural effusion and right lower lobe airspace consolidation. IMPRESSION: Status post left-sided thoracentesis with decrease in size of left pleural effusion. No pneumothorax. Electronically Signed   By: Marijo Sanes M.D.   On: 08/17/2021 12:19   DG Chest Port 1 View  Result Date: 08/16/2021 CLINICAL DATA:  Status post right thoracentesis. EXAM: PORTABLE CHEST 1 VIEW COMPARISON:  Same day. FINDINGS: No pneumothorax is noted status post right thoracentesis. Right pleural effusion is smaller compared to prior exam. Stable left pleural effusion. IMPRESSION: No pneumothorax status post right-sided thoracentesis. Electronically Signed   By: Marijo Conception M.D.   On: 08/16/2021 13:34   No results for input(s): WBC, HGB, HCT, PLT in the last 72 hours.   No results for input(s): NA, K, CL, CO2, GLUCOSE, BUN, CREATININE, CALCIUM in the last 72 hours.   Intake/Output Summary (Last 24 hours) at 08/18/2021 1159 Last data filed at 08/18/2021 0500 Gross per 24 hour  Intake 236 ml  Output 0 ml  Net 236 ml          Physical Exam: Vital Signs Blood pressure (!) 143/82, pulse 88, temperature 98.2 F (36.8 C), temperature source Oral, resp. rate 14, height 5\' 11"  (1.803 m), weight 124 kg, SpO2 92 %. Gen: no  distress, normal appearing HEENT: oral mucosa pink and moist, NCAT Cardio: Reg rate Chest: normal effort, normal rate of breathing Abd: soft, non-distended Ext: no edema Psych: pleasant, normal affect  Musculoskeletal:        General: Normal range of motion.     Cervical back: Normal range of motion.     Comments: Left knuckles with multiple scabs. Multiple bruises on BUE. L-BKA as below.  Pes planus deformity right foot without skin breakdown.   Skin:    Coloration: Skin is pale.     Neurological:     Mental Status: awakens and follows commands.     Comments: Normal language, no focal cn findings. UE grossly 4+/5. RLE 3+ prox to 4+/5 distally, LLE 2/5 proximally and limited by pain. Decreased sensation to PP and LT right foot. +bilateral hand tremor   Assessment/Plan: 1. Functional deficits which require 3+ hours per day of interdisciplinary therapy in a comprehensive inpatient rehab setting. Physiatrist is providing close team supervision and 24 hour management of active medical problems listed below. Physiatrist and rehab team continue to assess barriers to discharge/monitor patient progress toward functional and medical goals  Care Tool:  Bathing    Body parts bathed by patient: Right arm, Left arm, Chest, Abdomen, Front perineal area, Right upper leg, Left upper leg, Face   Body parts bathed by helper: Buttocks, Right  lower leg Body parts n/a: Left lower leg   Bathing assist Assist Level: Minimal Assistance - Patient > 75%     Upper Body Dressing/Undressing Upper body dressing   What is the patient wearing?: Pull over shirt    Upper body assist Assist Level: Set up assist    Lower Body Dressing/Undressing Lower body dressing      What is the patient wearing?: Pants, Incontinence brief     Lower body assist Assist for lower body dressing: Moderate Assistance - Patient 50 - 74%     Toileting Toileting    Toileting assist Assist for toileting: Maximal  Assistance - Patient 25 - 49%     Transfers Chair/bed transfer  Transfers assist     Chair/bed transfer assist level: Minimal Assistance - Patient > 75%     Locomotion Ambulation   Ambulation assist   Ambulation activity did not occur: Safety/medical concerns (weakness, decreased balance, fear of falling)  Assist level: Moderate Assistance - Patient 50 - 74% Assistive device: Walker-rolling Max distance: 2'   Walk 10 feet activity   Assist  Walk 10 feet activity did not occur: Safety/medical concerns (weakness, decreased balance, fear of falling)        Walk 50 feet activity   Assist Walk 50 feet with 2 turns activity did not occur: Safety/medical concerns (weakness, decreased balance, fear of falling)         Walk 150 feet activity   Assist Walk 150 feet activity did not occur: Safety/medical concerns (weakness, decreased balance, fear of falling)         Walk 10 feet on uneven surface  activity   Assist Walk 10 feet on uneven surfaces activity did not occur: Safety/medical concerns (weakness, decreased balance, fear of falling)         Wheelchair     Assist Is the patient using a wheelchair?: Yes Type of Wheelchair: Manual    Wheelchair assist level: Supervision/Verbal cueing Max wheelchair distance: 27ft    Wheelchair 50 feet with 2 turns activity    Assist        Assist Level: Supervision/Verbal cueing   Wheelchair 150 feet activity     Assist      Assist Level: Maximal Assistance - Patient 25 - 49%   Blood pressure (!) 143/82, pulse 88, temperature 98.2 F (36.8 C), temperature source Oral, resp. rate 14, height 5\' 11"  (1.803 m), weight 124 kg, SpO2 92 %.  Medical Problem List and Plan: 1.  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  Impaired mobility: continue Heparin added             -antiplatelet therapy: Continue ASA/Plavix 3. Pain: Decrease Diluadid to 0.5mg  daily prn. Oxycodone prn.              d/c'ed gabapentin given asterixis 4. Anxiety: LCSW to follow for evaluation and support. D/c klonopin given respiratory status. Trying relaxation techniques.              -antipsychotic agents: N/A 5. Neuropsych: This patient is capable of making decisions on his own behalf. 6. Skin/Wound Care: Continue wound VAC for couple more days per  ortho              --continue Vitamin C and Zinc with juven bid to promote wound healing.  7. Fluids/Electrolytes/Nutrition: Strict I/O. Routine check of labs with HD. 8. T2DM: Monitor BS ac/hs and use SSI for elevated BS             --Continue insulin Glargine with 2 units Novolog TID CBG (last 3)  Recent Labs    08/17/21 2116 08/18/21 0616 08/18/21 1128  GLUCAP 172* 146* 159*  Cbg's controlled -10/9: continue to hold semglee for now given sub-optimal po intake, novolog already stopped  9. ESRD: HD MWF at the end of the day to help with tolerance of therapy.              --on Sensipar and ferric citrate for metabolic bone disease.   -palliative care consulted as per family request Increased midodrine to 10mg  TID so he can tolerate more ultrafiltration 10. Anemia of chronic disease: Started on weekly [name] 09/28.              --routine H/H check with HD.  11. Hypotension: Increase midodrine to 10mg  TID so he can tolerate more ultrafiltration 12. Depression with acute reaction?: Continue Effexor  -wife here for support  -team providing egosupport as well 13. CAD: On ASA/Plavix and Lipitor. Monitor for symptoms with increase in activity.  14.  PAD: Continue Trental TID.  15.  Constipation: had BM with SMOG enema, added Linzess, increase colace to TID  16. Nausea: Improved at present.  17. Bilateral hand tremor: unknown cause- ammonia  normal. Appreciate nephrology and medicine following. D/c Gabapentin. Decrease oxycodone to 2.5mg  q4H prn 18. Decreased appetite: continue megace- cleared with nephrology 19. Insomnia: d/c trazodone and replace with Rozarem. Improved. Continue Rozarem. Discussed that sleeping between hours of 10pm and 2am are most important for restorative sleep  -advised pt to put on some music, back ground noise to help with falling asleep  -wife staying over helpful also 20. Lethargy: d/c klonopin. Improved. 21. Shortness of breath: improved after bilateral thoracotomy. Appreciate heart failure and critical care involvement      LOS: 15 days A FACE TO FACE EVALUATION WAS PERFORMED  Maxwell Harmon 08/18/2021, 11:59 AM

## 2021-08-18 NOTE — Progress Notes (Signed)
Name: Maxwell Harmon MRN: [id number] DOB: 10-18-1956    ADMISSION DATE:  08/03/2021 CONSULTATION DATE:  08/18/2021   REFERRING MD :  Ranell Patrick, rehab MD  CHIEF COMPLAINT:  resp distress   HISTORY OF PRESENT ILLNESS: 65 year old man with ESRD on hemodialysis, ischemic cardiomyopathy with EF of 30%, left BKA revision who was in rehab.  PCCM consulted for bilateral pleural effusions, difficult volume removal with dialysis. He has severe PAD.  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  He had wound dehiscence and was readmitted 07/27/2021 for revision of left BKA.  He had a complicated postoperative course with hypertension, volume overload, pain and depression with hypoxia.  Transferred to [name] on 9/29. He is followed by renal.  In spite of dialysis he has bilateral effusions and pedal edema and persistent shortness of breath.  He is on oxycodone for pain relief, this was decreased due to asterixis and clonazepam resumed. He is sleepy this morning and reports mild shortness of breath not relieved by oxygen.  He denies chest pain or wheezing.  He is a lifetime never smoker  PAST MEDICAL HISTORY :  Right nephrectomy for renal cancer Left BKA, revision Diabetes type 2 ESRD on dialysis Severe PAD CABG 2021 05/2021 echo showing EF of 30% 04/2021 LHC >> severe CAD, no targets for revascularization  Significant tests/ events reviewed 10/12 RT thora >> 1.5 L 10/13 LT thora >> 1.5 L   SUBJECTIVE:  Out of bed to chair Afebrile Breathing is much improved, no chest pain  VITAL SIGNS: Temp:  [98.2 F (36.8 C)-98.4 F (36.9 C)] 98.2 F (36.8 C) (10/14 0353) Pulse Rate:  [88-95] 88 (10/14 0353) Resp:  [14-16] 14 (10/14 0353) BP: (121-143)/(69-82) 143/82 (10/14 0353) SpO2:  [90 %-93 %] 92 % (10/14 0353) Weight:  [124 kg] 124 kg (10/14 0500)  PHYSICAL EXAMINATION: Gen. Pleasant,  well-nourished, in no distress, appears more alert today ENT - no lesions, no post nasal drip Neck: No JVD, no thyromegaly, no carotid bruits Lungs: No accessory muscle use, decreased breath sounds bilateral, no hematoma at thoracentesis site Cardiovascular: Ejection systolic murmur 3/6 at base, S1-S2 regular Abdomen: soft and non-tender, no hepatosplenomegaly, BS normal. Musculoskeletal: Left BKA, no cyanosis or clubbing, 1+ edema Neuro:  alert, non focal Skin:  Warm, no lesions/ rash  Pleural fluid shows low LDH and protein   Recent Labs  Lab 08/11/21 1509  NA 130*  K 3.8  CL 93*  CO2 27  BUN 43*  CREATININE 5.03*  GLUCOSE 104*    Recent Labs  Lab 08/11/21 1509  HGB 9.0*  HCT 30.2*  WBC 6.2  PLT 146*    DG Chest Port 1 View  Result Date: 08/17/2021 CLINICAL DATA:  Status left-sided post thoracentesis. EXAM: PORTABLE CHEST 1 VIEW COMPARISON:  Chest x-ray 08/16/2021 FINDINGS: Interval reduction in size of the left-sided pleural effusion. A small effusion persists. Moderate overlying atelectasis. No left-sided thoracentesis. Persistent right pleural effusion and right lower lobe airspace consolidation. IMPRESSION: Status post left-sided thoracentesis with decrease in size of left pleural effusion. No pneumothorax. Electronically Signed   By: Marijo Sanes M.D.   On: 08/17/2021 12:19   DG Chest Port 1 View  Result Date: 08/16/2021 CLINICAL DATA:  Status post right thoracentesis. EXAM: PORTABLE CHEST 1 VIEW COMPARISON:  Same day. FINDINGS: No pneumothorax is noted status post right thoracentesis. Right pleural effusion is smaller compared to  prior exam. Stable left pleural effusion. IMPRESSION: No pneumothorax status post right-sided thoracentesis. Electronically Signed   By: Marijo Conception M.D.   On: 08/16/2021 13:34    ASSESSMENT / PLAN:  Bilateral pleural effusions, progressive. - related to HFrEf/acute systolic heart failure and inability to remove fluid during  dialysis due to hypotension.   -Status post bilateral thoracentesis with 1.5 L fluid removal and significant improvement in dyspnea  Discussed with CHF service, not a candidate for advanced heart failure interventions  Recommend -Did not see how long he goes before he develops dyspnea again due to recurrent effusions -Discussed possibility of Pleurx catheter and he would be willing to proceed.  Discussed risks and benefits.  We will hold Plavix in anticipation of doing this next week, will repeat chest x-ray in 3 to 4 days  Kara Mead MD. [facility]. [place] [facility] Pager [phone] If no response call [phone]    08/18/2021, 12:50 PM

## 2021-08-18 NOTE — Progress Notes (Signed)
Occupational Therapy Session Note  Patient Details  Name: Maxwell Harmon MRN: [id number] Date of Birth: 02-Oct-1956  Today's Date: 08/18/2021 OT Individual Time: 8032-1224 OT Individual Time Calculation (min): 17 min    Short Term Goals: Week 2:  OT Short Term Goal 1 (Week 2): Pt will perform LB dressing with MIN A OT Short Term Goal 2 (Week 2): Pt will complete MIN A squat pivot transfer to [facility] OT Short Term Goal 3 (Week 2): Pt will engage in self-care tasks for 5 minutes w/o requiring external O2 support OT Short Term Goal 4 (Week 2): Pt will complete sit > stand with min assist to decrease burden of care with LB dressing  Skilled Therapeutic Interventions/Progress Updates:  Pt received seated in w/c, reporting increased fatigue and readiness for dialysis. Encouraged pt to participate in w/c or bed level BUE strengthening, however pt reported inability to participate. Pt with request to get back into bed. Transferred w/c > EOB, squat pivot with min A, bed mobility with min A. Repositioned pt to increase comfort with pillows provided. Bed alarm activated, and all needs in reach at end of session. Pt missed 13 mins of OT, due to increased fatigue and unwillingness to participate.  Therapy Documentation Precautions:  Precautions Precautions: Fall Precaution Comments: wound vac Required Braces or Orthoses: Other Brace Other Brace: limb guard on the LLE Restrictions Weight Bearing Restrictions: No LLE Weight Bearing: Non weight bearing  Pain: No pain reported this session.   Therapy/Group: Individual Therapy  Birdia Jaycox E Divya Munshi 08/18/2021, 7:35 AM

## 2021-08-18 NOTE — Progress Notes (Addendum)
Advanced Heart Failure Rounding Note  PCP-Cardiologist: None   Subjective:    Feels okay today. No dyspnea at rest but reports SOB during rehab sessions.  No dizziness this am pre dialysis    Objective:   Weight Range: 124 kg Body mass index is 38.13 kg/m.   Vital Signs:   Temp:  [98.2 F (36.8 C)-98.4 F (36.9 C)] 98.2 F (36.8 C) (10/14 0353) Pulse Rate:  [88-95] 88 (10/14 0353) Resp:  [14-16] 14 (10/14 0353) BP: (121-143)/(69-82) 143/82 (10/14 0353) SpO2:  [90 %-93 %] 92 % (10/14 0353) Weight:  [124 kg] 124 kg (10/14 0500) Last BM Date: 08/15/21  Weight change: Filed Weights   08/15/21 0500 08/17/21 0500 08/18/21 0500  Weight: 123 kg 122 kg 124 kg    Intake/Output:   Intake/Output Summary (Last 24 hours) at 08/18/2021 1139 Last data filed at 08/18/2021 0500 Gross per 24 hour  Intake 236 ml  Output 0 ml  Net 236 ml      Physical Exam    General:  No distress. Sitting up in wheelchair. HEENT: normal Neck: JVP to jaw. Carotids 2+ bilat; no bruits.  Cor: PMI nondisplaced. Regular rate & rhythm. No rubs, gallops, 2/6 SEM Lungs: clear Abdomen: soft, nontender, + mildly distended. No hepatosplenomegaly.  Extremities: no cyanosis, clubbing, rash, s/p L BKA, 2+ edema on right Neuro: alert & orientedx3, cranial nerves grossly intact. moves all 4 extremities w/o difficulty. Affect pleasant    Telemetry   N/a   Labs    CBC No results for input(s): WBC, NEUTROABS, HGB, HCT, MCV, PLT in the last 72 hours. Basic Metabolic Panel No results for input(s): NA, K, CL, CO2, GLUCOSE, BUN, CREATININE, CALCIUM, MG, PHOS in the last 72 hours. Liver Function Tests Recent Labs    08/16/21 1230 08/17/21 1137  PROT 5.6* 5.8*   No results for input(s): LIPASE, AMYLASE in the last 72 hours. Cardiac Enzymes No results for input(s): CKTOTAL, CKMB, CKMBINDEX, TROPONINI in the last 72 hours.  BNP: BNP (last 3 results) Recent Labs    04/10/21 1033  06/16/21 0505  BNP 4,239.3* >4,500.0*    ProBNP (last 3 results) No results for input(s): PROBNP in the last 8760 hours.   D-Dimer No results for input(s): DDIMER in the last 72 hours. Hemoglobin A1C No results for input(s): HGBA1C in the last 72 hours. Fasting Lipid Panel No results for input(s): CHOL, HDL, LDLCALC, TRIG, CHOLHDL, LDLDIRECT in the last 72 hours. Thyroid Function Tests No results for input(s): TSH, T4TOTAL, T3FREE, THYROIDAB in the last 72 hours.  Invalid input(s): FREET3  Other results:   Imaging    DG Chest Port 1 View  Result Date: 08/17/2021 CLINICAL DATA:  Status left-sided post thoracentesis. EXAM: PORTABLE CHEST 1 VIEW COMPARISON:  Chest x-ray 08/16/2021 FINDINGS: Interval reduction in size of the left-sided pleural effusion. A small effusion persists. Moderate overlying atelectasis. No left-sided thoracentesis. Persistent right pleural effusion and right lower lobe airspace consolidation. IMPRESSION: Status post left-sided thoracentesis with decrease in size of left pleural effusion. No pneumothorax. Electronically Signed   By: Marijo Sanes M.D.   On: 08/17/2021 12:19     Medications:     Scheduled Medications:  vitamin C  1,000 mg Oral Daily   aspirin EC  81 mg Oral Daily   atorvastatin  40 mg Oral Daily   cinacalcet  30 mg Oral Weekly   darbepoetin ([name]) injection - DIALYSIS  200 mcg Intravenous Q Wed-HD   docusate sodium  200 mg Oral Daily   ferric citrate  420 mg Oral TID with meals   insulin aspart  0-6 Units Subcutaneous TID WC   linaclotide  290 mcg Oral QAC breakfast   midodrine  10 mg Oral TID WC   multivitamin  1 tablet Oral QHS   nutrition supplement (JUVEN)  1 packet Oral BID BM   pantoprazole  40 mg Oral Daily   ramelteon  8 mg Oral QHS   senna  2 tablet Oral Daily   venlafaxine XR  37.5 mg Oral QPM    Infusions:   PRN Medications: acetaminophen, bisacodyl, calcium carbonate, diphenhydrAMINE,  guaiFENesin-dextromethorphan, HYDROmorphone (DILAUDID) injection, methocarbamol, ondansetron (ZOFRAN) IV, oxyCODONE, phenol, polyethylene glycol, prochlorperazine **OR** prochlorperazine **OR** prochlorperazine, simethicone, sorbitol    Patient Profile   Maxwell Harmon is a 65 year old son in law of Dr Ninfa Linden) with a history of DM, PAD, charcot r foot, CAD, S/P CABG 2021, ESRD (one kidney removed for malignancy), previously on PD--> iHD, and LBKA.    Admitted to [name] after BKA revision. Developed increasing dyspnea. Developed bilateral pleural effusions S/P Thoracentesis.   Assessment/Plan   1. Bilateral pleural effusions -S/P Bilateral Thoracentesis with 1.5 liters removed on R/1.5 L on left -Dr. Haroldine Laws discussed palliative PleurX catheters with Dr. Elsworth Soho   2. S/P BKA Revision , PAD   3. Acute on Chronic Systolic Heart Failure -Echo 05/2021 EF 25-30%  -Volume status managed with HD. Appears overloaded. Fluid removal limited by hypotension. On Midodrine 10 mg TID. Can increase as needed to support volume removal with HD -No GDMT d/t hypotension and ESRD -Few options from HF perspective   4. ESRD -On HD M, W, F. Still with volume on board. As above    5. CAD -H/O  CABG 2021 - Cath 6/22 with patent LIMA to LAD but LAD occluded after LIMA plugs in, sequential SVG to OM1 & OM2 with mild narrowing at OM1 insertion otherwise okay (OMs small), 100% RCA. No revasc options - On aspirin, statin. Plavix held for thoracentesis. ? Timing of adding back - No chest pain.      Length of Stay: [place], [place], PA-C  08/18/2021, 11:39 AM  Advanced Heart Failure Team Pager [phone] (M-F; 7a - 5p)  Please contact [facility] for night-coverage after hours (5p -7a ) and weekends on [email or web address]  Patient seen and examined with the above-signed Advanced Practice Provider and/or Housestaff. I personally reviewed laboratory data, imaging studies and relevant notes. I independently examined the  patient and formulated the important aspects of the plan. I have edited the note to reflect any of my changes or salient points. I have personally discussed the plan with the patient and/or family.  As above, little to offer from HF perspective. Can increase midodrine to facilitate more volume removal with HD. If effusions recur can consider palliative Pleurex drains.   No revascularization options for CAD unfortunately.   AHF team will sign off. Please call with questions.   Glori Bickers, MD  1:49 PM

## 2021-08-18 NOTE — Progress Notes (Signed)
Occupational Therapy Weekly Progress Note  Patient Details  Name: Maxwell Harmon MRN: [id number] Date of Birth: 11-16-1955  Beginning of progress report period: August 11, 2021 End of progress report period: August 18, 2021  Patient has met 1 of 3 short term goals. Pt has had slow progress towards goals due to increased pain, fatigue, decreased arousal level, with fluctuating good days. Pt tends to be better able to participate in therapy on the day after dialysis, but has routinely struggled the morning of being due for dialysis. Pt has decreased his reliance on supplemental oxygen during OT, with 2 sessions (under 1.5 hours) completed this date on RA and >95% SPO2, demonstrating improvement in cardiorespiratory endurance. Due to pain, pt has continued to complete LBD and LB bathing at bed level, but is set up A for seated UB bathing and set up-min A for UBD depending on pain. Pt has completed a SB functional transfer to [facility] with CGA-min A. Pt still demonstrates weakness/fatigue, impacting the ability to stand for LB clothing management. Pt's daughter has been present for family education, but both pt's wife and daughter have expressed concerns with pt's CLOF for the expectations/needs at home. Pt with report during session this date, of potential surgical procedure this upcomming weekend, of drains supposed to assist with excess fluid, in which pt would benefit from continued ADL education/assistance with an additional medical factor.  Patient continues to demonstrate the following deficits: muscle weakness and decreased cardiorespiratoy endurance and decreased oxygen support, decreased balance and activity tolerance and therefore will continue to benefit from skilled OT intervention to enhance overall performance with BADL, iADL, and Reduce care partner burden.  Patient progressing toward long term goals..  Continue plan of care.  OT Short Term Goals Week 2:  OT Short Term Goal 1 (Week 2): Pt  will perform LB dressing with MIN A OT Short Term Goal 1 - Progress (Week 2): Progressing toward goal OT Short Term Goal 2 (Week 2): Pt will complete MIN A squat pivot transfer to [facility] OT Short Term Goal 2 - Progress (Week 2): Progressing toward goal OT Short Term Goal 3 (Week 2): Pt will engage in self-care tasks for 5 minutes w/o requiring external O2 support OT Short Term Goal 3 - Progress (Week 2): Met OT Short Term Goal 4 (Week 2): Pt will complete sit > stand with min assist to decrease burden of care with LB dressing OT Short Term Goal 4 - Progress (Week 2): Discontinued (comment) (Discontinued due to LTG for sit > stand downgraded to mod A level) Week 3:  OT Short Term Goal 1 (Week 3): STG = LTG due ELOS    Kashmere Staffa E Michaiah Maiden 08/18/2021, 7:36 AM

## 2021-08-18 NOTE — Progress Notes (Signed)
Patient requested Klonopin for anxiety tonight. Stated "I've been taking it." No order noted in patient scheduled or prn orders for Klonopin.  Dr Posey Pronto on call MD notified. Order obtained for Melatonin 3 mg.   Pharmacy notified of new order Melatonin 3 mg to be administered with Rozerem 8 mg. Pharmacy stated both medications were safe to be administered together.

## 2021-08-18 NOTE — Progress Notes (Addendum)
  [facility] Progress Note   Dialysis Orders:  MWF [facility]  [place]   Assessment/Plan: 1.  Status post revision of left below-knee amputation: Admitted to inpatient rehabilitation unit. 2. B/L pleural effusion: pulm on board, s/p rt thora 10/13 w/ 1.5L removed, left sided thora  3. ESRD: on HD MWF schedule -> plan for HD today. UF as tolerated; still has significant edema. 4. Anemia of chronic disease: Hemoglobin 9's, resumed [name]200 ug weekly while here on Wed's. 5. CKD-MBD: Calcium level acceptable with improving phosphorus levels on [place]. 6. Nutrition: Ongoing renal diet with protein supplementation and renal multivitamin.  Fluid restriction. 7. Hypertension/volume excess: With episodes of intradialytic hypotension-on midodrine 10 mg twice daily. Has some dependent edema which has not been affected by HD for the most part.  8. Asterixis - not uremic and NH3 is wnl. Avoid gabapentin/ lyrica and excess narcotics as these can cause asterixis in ESRD pts.  Subjective: Seen in room. Feels better after thora 10/12 1.5L removed. Denies f/c/n/v/ CP.   Objective Vitals:   08/17/21 1438 08/17/21 1933 08/18/21 0353 08/18/21 0500  BP: 131/72 121/69 (!) 143/82   Pulse: 89 95 88   Resp: 16 14 14    Temp: 98.4 F (36.9 C) 98.2 F (36.8 C) 98.2 F (36.8 C)   TempSrc: Oral Oral Oral   SpO2: 90% 93% 92%   Weight:    124 kg  Height:         Additional Objective Labs: Basic Metabolic Panel: Recent Labs  Lab 08/11/21 1509  NA 130*  K 3.8  CL 93*  CO2 27  GLUCOSE 104*  BUN 43*  CREATININE 5.03*  CALCIUM 9.0  PHOS 3.1   CBC: Recent Labs  Lab 08/11/21 1509  WBC 6.2  HGB 9.0*  HCT 30.2*  MCV 106.7*  PLT 146*   Blood Culture    Component Value Date/Time   SDES FLUID PLEURAL LEFT 08/17/2021 1051   SPECREQUEST NONE 08/17/2021 1051   CULT PENDING 08/17/2021 1051   REPTSTATUS PENDING 08/17/2021 1051     Physical Exam General: nad Heart:  RRR Lungs: cta bl Abdomen: soft non-tender  Extremities: L BKA, edema in UE and rt lower ext Neuro: alert this am Dialysis Access: LUE AVF +bruit  Medications:   vitamin C  1,000 mg Oral Daily   aspirin EC  81 mg Oral Daily   atorvastatin  40 mg Oral Daily   cinacalcet  30 mg Oral Weekly   darbepoetin ([name]) injection - DIALYSIS  200 mcg Intravenous Q Wed-HD   docusate sodium  200 mg Oral Daily   ferric citrate  420 mg Oral TID with meals   insulin aspart  0-6 Units Subcutaneous TID WC   linaclotide  290 mcg Oral QAC breakfast   midodrine  10 mg Oral TID WC   multivitamin  1 tablet Oral QHS   nutrition supplement (JUVEN)  1 packet Oral BID BM   pantoprazole  40 mg Oral Daily   ramelteon  8 mg Oral QHS   senna  2 tablet Oral Daily   venlafaxine XR  37.5 mg Oral QPM

## 2021-08-18 NOTE — Progress Notes (Signed)
Occupational Therapy Session Note  Patient Details  Name: Maxwell Harmon MRN: [id number] Date of Birth: August 22, 1956  Today's Date: 08/18/2021 OT Individual Time: 0801-0901 OT Individual Time Calculation (min): 60 min    Short Term Goals: Week 2:  OT Short Term Goal 1 (Week 2): Pt will perform LB dressing with MIN A OT Short Term Goal 2 (Week 2): Pt will complete MIN A squat pivot transfer to [facility] OT Short Term Goal 3 (Week 2): Pt will engage in self-care tasks for 5 minutes w/o requiring external O2 support OT Short Term Goal 4 (Week 2): Pt will complete sit > stand with min assist to decrease burden of care with LB dressing  Skilled Therapeutic Interventions/Progress Updates:  Skilled OT intervention completed with focus on ADL retraining. Pt received semi-supine in bed, awake and agreeable to therapy. Reported that he received sleeping meds, however did not sleep. Completed perineal care at bed level with pt able to wipe front perineal with set up and therapist cleaning buttocks and switching out briefs with max A, pt rolling R<>L to assist. Attempted to complete LB dress at EOB, however pt with increased back pain, reported from recent procedure. Pt able to thread residual limb, requiring assist to thread RLE, and pt assisting donning shorts by rolling R<>L, therapist providing mod assist. Semi-supine>EOB with min A, EOB > w/c squat pivot with mod A. SPO2 on RA at 95%, remained off [place] throughout rest of session. While seated in w/c, pt completed UB bathing and grooming tasks at sink, with set up A. Required min A for UB dressing due to back pain. Changed bed linens, and helped pt remove adhesive bandages due to pt discomfort. Pt left seated in w/c, with alarm belt on/activated, and all needs in reach at end of session.  Therapy Documentation Precautions:  Precautions Precautions: Fall Precaution Comments: wound vac Required Braces or Orthoses: Other Brace Other Brace: limb guard on the  LLE Restrictions Weight Bearing Restrictions: No LLE Weight Bearing: Non weight bearing  Pain: Pt with unrated pain, however reported pain in back from recent thoracentesis procedure. Repositioned pt throughout session for increased comfort.  Therapy/Group: Individual Therapy  Sobia Karger E Nic Lampe 08/18/2021, 7:34 AM

## 2021-08-18 NOTE — Progress Notes (Signed)
Physical Therapy Weekly Progress Note  Patient Details  Name: Maxwell Harmon MRN: [id number] Date of Birth: 1956/04/09  Beginning of progress report period: August 04, 2021 End of progress report period: August 18, 2021  Today's Date: 08/18/2021 PT Individual Time: 1002-1022 PT Individual Time Calculation (min): 20 min  Today's Date: 08/18/2021 PT Missed Time: 40 Minutes Missed Time Reason: Patient fatigue  Patient has met 0 of 3 short term goals. Pt demonstrates slow progress towards long term goals. Pt currently requires min A for bed mobility, light min A for lateral scoot transfers, and is able to perform sit<>stands from extremely elevated surfaces with RW and mod A. Pt continues to be limited by fatigue, poor sleep schedule, generalized weakness/deconditioning, and decreased balance/postural control. Plan for family education with wife on 10/18.   Patient continues to demonstrate the following deficits muscle weakness and muscle joint tightness, decreased cardiorespiratoy endurance, and decreased sitting balance, decreased standing balance, decreased postural control, and decreased balance strategies and therefore will continue to benefit from skilled PT intervention to increase functional independence with mobility.  Patient progressing toward long term goals..  Continue plan of care.  PT Short Term Goals Week 2:  PT Short Term Goal 1 (Week 2): pt will transfer sit<>stand with LRAD and min A PT Short Term Goal 1 - Progress (Week 2): Progressing toward goal PT Short Term Goal 2 (Week 2): pt will transfer stand<>pivot with LRAD and min A PT Short Term Goal 2 - Progress (Week 2): Progressing toward goal PT Short Term Goal 3 (Week 2): pt will perform simulated car transfer with CGA PT Short Term Goal 3 - Progress (Week 2): Progressing toward goal Week 3:  PT Short Term Goal 1 (Week 3): STG=LTG due to LOS  Skilled Therapeutic Interventions/Progress Updates:  Ambulation/gait  training;Discharge planning;Functional mobility training;Psychosocial support;Therapeutic Activities;Balance/vestibular training;Disease management/prevention;Neuromuscular re-education;Skin care/wound management;Therapeutic Exercise;Wheelchair propulsion/positioning;DME/adaptive equipment instruction;Pain management;Splinting/orthotics;UE/LE Strength taining/ROM;Community reintegration;Patient/family education;Stair training;UE/LE Coordination activities   Today's Interventions: Received pt sitting in WC, pt agreeable to PT treatment, and denied any pain during session but reported having a "rough" night, not getting any sleep, and unsure if he would be able to participate but agreed to try. O2 sat 94% on RA and pt denied any SOB. Pt transported to/from room in [facility] total A for energy conservation purposes. Pt performed BUE strengthening on UBE on level 1 for 1 minute forward x 2 trials and 1 minute backwards x 1 trial with 1-1.5 minute rest in between each minute interval. Pt then reported complete exhaustion stating "I'm sorry, I can't do anymore " and requested to return to room. Pt requested to stay in [facility] to wait for final session to conserve energy and avoid having to perform additional transfers. Concluded session with pt sitting in WC, needs within reach, and seatbelt alarm on. 40 minutes missed of skilled physical therapy due to fatigue.   Therapy Documentation Precautions:  Precautions Precautions: Fall Precaution Comments: wound vac Required Braces or Orthoses: Other Brace Other Brace: limb guard on the LLE Restrictions Weight Bearing Restrictions: No LLE Weight Bearing: Non weight bearing   Therapy/Group: Individual Therapy Alfonse Alpers PT, DPT   08/18/2021, 7:15 AM

## 2021-08-19 DIAGNOSIS — J90 Pleural effusion, not elsewhere classified: Secondary | ICD-10-CM | POA: Diagnosis not present

## 2021-08-19 DIAGNOSIS — Z89512 Acquired absence of left leg below knee: Secondary | ICD-10-CM | POA: Diagnosis not present

## 2021-08-19 DIAGNOSIS — Z9889 Other specified postprocedural states: Secondary | ICD-10-CM | POA: Diagnosis not present

## 2021-08-19 DIAGNOSIS — F5104 Psychophysiologic insomnia: Secondary | ICD-10-CM | POA: Diagnosis not present

## 2021-08-19 DIAGNOSIS — N186 End stage renal disease: Secondary | ICD-10-CM

## 2021-08-19 DIAGNOSIS — I953 Hypotension of hemodialysis: Secondary | ICD-10-CM

## 2021-08-19 LAB — GLUCOSE, CAPILLARY
Glucose-Capillary: 116 mg/dL — ABNORMAL HIGH (ref 70–99)
Glucose-Capillary: 169 mg/dL — ABNORMAL HIGH (ref 70–99)
Glucose-Capillary: 190 mg/dL — ABNORMAL HIGH (ref 70–99)
Glucose-Capillary: 154 mg/dL — ABNORMAL HIGH (ref 70–99)

## 2021-08-19 LAB — BODY FLUID CULTURE W GRAM STAIN: Culture: NO GROWTH

## 2021-08-19 MED ORDER — SENNOSIDES-DOCUSATE SODIUM 8.6-50 MG PO TABS
2.0000 | ORAL_TABLET | Freq: Two times a day (BID) | ORAL | Status: DC
  Administered 2021-08-19 – 2021-08-21 (×6): 2 via ORAL
  Filled 2021-08-19 (×5): qty 2

## 2021-08-19 MED ORDER — POLYETHYLENE GLYCOL 3350 17 G PO PACK
17.0000 g | PACK | Freq: Two times a day (BID) | ORAL | Status: DC
  Administered 2021-08-19 – 2021-08-22 (×6): 17 g via ORAL
  Filled 2021-08-19 (×7): qty 1

## 2021-08-19 NOTE — Progress Notes (Signed)
Physical Therapy Session Note  Patient Details  Name: Maxwell Harmon MRN: [id number] Date of Birth: 1956/04/28  Today's Date: 08/19/2021 PT Individual Time: 2993 - 7169, 60 min individual     Short Term Goals:  Week 3:  PT Short Term Goal 1 (Week 3): STG=LTG due to LOS  Skilled Therapeutic Interventions/Progress Updates:   Pt sitting up in wc.  Pt's sister and father visiting. Pt denied pain, but stated that he was completely exhausted from earlier OT session, because he has not slept in 2 nights.  Pt willing to start with seated tx. Pt doffed limb protector when requested.  Therapeutic exercise performed with LE to increase strength for functional mobility.Seated: 20 x 1 R heel raises, toe raises, bil adductor squeezes with core activation, glut sets, L quad sets, . 2 x 10 R long arc quad knee extensions, trunk flexion/extensions, L short arc quad knee extensions. Bil briding with bolster under L thigh to prevent wt bearing on distal end of LLE  Slide board transfer to R, lower deflated bed, with min assist and set up of the board.  Pt had difficulty wt shifting enough to move board over lateral hip guides of cushion.  Sit> supine with supervision.  Bed re-inflated. At end of session, pt resting with alarm set and needs at hand.   Therapy Documentation Precautions:  Precautions Precautions: Fall Precaution Comments: wound vac Required Braces or Orthoses: Other Brace Other Brace: limb guard on the LLE Restrictions Weight Bearing Restrictions: Yes LLE Weight Bearing: Non weight bearing      Therapy/Group: Individual Therapy  Eryn Krejci 08/19/2021, 10:29 AM

## 2021-08-19 NOTE — Progress Notes (Signed)
PROGRESS NOTE   Subjective/Complaints: Patient seen sitting up in bed this morning.  He states he did not sleep well overnight, this is a chronic issue.  He notes constipation as well.  He has questions regarding HD.  He has questions regarding anticoagulation.  ROS: Denies CP, SOB, N/V/D  Objective:   DG CHEST PORT 1 VIEW  Result Date: 08/18/2021 CLINICAL DATA:  Pleural effusion EXAM: PORTABLE CHEST 1 VIEW COMPARISON:  08/17/2021 FINDINGS: Unchanged right pleural effusion, with increased left pleural effusion. Associated atelectasis. Unchanged cardiac and mediastinal contours, status post median sternotomy. No acute osseous abnormality. IMPRESSION: Increased left pleural effusion, with unchanged right pleural effusion. Electronically Signed   By: Merilyn Baba M.D.   On: 08/18/2021 13:35   DG Chest Port 1 View  Result Date: 08/17/2021 CLINICAL DATA:  Status left-sided post thoracentesis. EXAM: PORTABLE CHEST 1 VIEW COMPARISON:  Chest x-ray 08/16/2021 FINDINGS: Interval reduction in size of the left-sided pleural effusion. A small effusion persists. Moderate overlying atelectasis. No left-sided thoracentesis. Persistent right pleural effusion and right lower lobe airspace consolidation. IMPRESSION: Status post left-sided thoracentesis with decrease in size of left pleural effusion. No pneumothorax. Electronically Signed   By: Marijo Sanes M.D.   On: 08/17/2021 12:19   Recent Labs    08/18/21 1521  WBC 9.1  HGB 10.1*  HCT 33.2*  PLT 161     Recent Labs    08/18/21 1521  NA 132*  K 4.1  CL 97*  CO2 24  GLUCOSE 141*  BUN 62*  CREATININE 5.66*  CALCIUM 9.0     Intake/Output Summary (Last 24 hours) at 08/19/2021 0843 Last data filed at 08/18/2021 1830 Gross per 24 hour  Intake --  Output 3000 ml  Net -3000 ml           Physical Exam: Vital Signs Blood pressure 137/79, pulse 90, temperature 98.3 F (36.8  C), temperature source Oral, resp. rate 19, height 5\' 11"  (1.803 m), weight 96 kg, SpO2 97 %. Constitutional: No distress . Vital signs reviewed. HENT: Normocephalic.  Atraumatic. Eyes: EOMI. No discharge. Cardiovascular: No JVD.  RRR. Respiratory: Normal effort.  No stridor.  Bilateral clear to auscultation. GI: Non-distended.  BS +. Skin: Warm and dry.  Left BKA with sutures. Psych: Normal mood.  Normal behavior. Musc: Left BKA with tenderness, improving edema Neuro: Alert  Motor: B/l UE grossly 4+/5.  RLE: 3+/5 prox to 4+/5 distally LLE: 3/5 proximally and limited by pain.  Decreased sensation to PP and LT right foot.   Assessment/Plan: 1. Functional deficits which require 3+ hours per day of interdisciplinary therapy in a comprehensive inpatient rehab setting. Physiatrist is providing close team supervision and 24 hour management of active medical problems listed below. Physiatrist and rehab team continue to assess barriers to discharge/monitor patient progress toward functional and medical goals  Care Tool:  Bathing    Body parts bathed by patient: Right arm, Left arm, Chest, Abdomen, Front perineal area, Right upper leg, Left upper leg, Face   Body parts bathed by helper: Buttocks, Right lower leg Body parts n/a: Left lower leg   Bathing assist Assist Level: Minimal Assistance - Patient > 75%  Upper Body Dressing/Undressing Upper body dressing   What is the patient wearing?: Pull over shirt    Upper body assist Assist Level: Set up assist    Lower Body Dressing/Undressing Lower body dressing      What is the patient wearing?: Pants, Incontinence brief     Lower body assist Assist for lower body dressing: Moderate Assistance - Patient 50 - 74%     Toileting Toileting    Toileting assist Assist for toileting: Maximal Assistance - Patient 25 - 49%     Transfers Chair/bed transfer  Transfers assist     Chair/bed transfer assist level: Minimal  Assistance - Patient > 75%     Locomotion Ambulation   Ambulation assist   Ambulation activity did not occur: Safety/medical concerns (weakness, decreased balance, fear of falling)  Assist level: Moderate Assistance - Patient 50 - 74% Assistive device: Walker-rolling Max distance: 2'   Walk 10 feet activity   Assist  Walk 10 feet activity did not occur: Safety/medical concerns (weakness, decreased balance, fear of falling)        Walk 50 feet activity   Assist Walk 50 feet with 2 turns activity did not occur: Safety/medical concerns (weakness, decreased balance, fear of falling)         Walk 150 feet activity   Assist Walk 150 feet activity did not occur: Safety/medical concerns (weakness, decreased balance, fear of falling)         Walk 10 feet on uneven surface  activity   Assist Walk 10 feet on uneven surfaces activity did not occur: Safety/medical concerns (weakness, decreased balance, fear of falling)         Wheelchair     Assist Is the patient using a wheelchair?: Yes Type of Wheelchair: Manual    Wheelchair assist level: Supervision/Verbal cueing Max wheelchair distance: 37ft    Wheelchair 50 feet with 2 turns activity    Assist        Assist Level: Supervision/Verbal cueing   Wheelchair 150 feet activity     Assist      Assist Level: Maximal Assistance - Patient 25 - 49%   Blood pressure 137/79, pulse 90, temperature 98.3 F (36.8 C), temperature source Oral, resp. rate 19, height 5\' 11"  (1.803 m), weight 96 kg, SpO2 97 %.  Medical Problem List and Plan: 1.  Functional and mobility deficits secondary to PAD and left BKA/BKA revision 07/27/21   Continue [name] 2.  Impaired mobility: continue Heparin added             -antiplatelet therapy: Continue ASA, Plavix on hold per [facility] 3. Pain: Decrease Diluadid to 0.5mg  daily prn. Oxycodone prn.              d/c'ed gabapentin given asterixis  Controlled with meds on 10/15 4.  Anxiety: LCSW to follow for evaluation and support. D/c klonopin given respiratory status. Trying relaxation techniques.              -antipsychotic agents: N/A 5. Neuropsych: This patient is capable of making decisions on his own behalf. 6. Skin/Wound Care: Continue wound VAC for couple more days per ortho              --continue Vitamin C and Zinc with juven bid to promote wound healing.  7. Fluids/Electrolytes/Nutrition: Strict I/Os. Routine check of labs with HD. 8. T2DM: Monitor BS ac/hs and use SSI for elevated BS             --Continue insulin  Glargine with 2 units Novolog TID CBG (last 3)  Recent Labs    08/18/21 1901 08/18/21 2121 08/19/21 0359  GLUCAP 96 119* 116*   -10/9: continue to hold semglee for now given sub-optimal po intake, novolog already stopped  Mildly elevated on 10/15 9. ESRD: HD MWF at the end of the day to help with tolerance of therapy.              --on Sensipar and ferric citrate for metabolic bone disease.   -palliative care consulted as per family request Increased midodrine to 10mg  TID so he can tolerate more ultrafiltration 10. Anemia of chronic disease: Started on weekly [name] 09/28.              --routine H/H check with HD.   Hemoglobin 10.1 on 10/14 11. Hypotension: Increase midodrine to 10mg  TID so he can tolerate more ultrafiltration  Controlled on 10/15 12. Depression with acute reaction?: Continue Effexor  -wife here for support  -team providing egosupport as well 13. CAD: Lipitor. Monitor for symptoms with increase in activity.   See #2 14.  PAD: Continue Trental TID.  15.  Constipation: had BM with SMOG enema, added Linzess, increase colace to TID   Bowel meds increased again on 10/15 16. Nausea: Improved at present.  17. Bilateral hand tremor: unknown cause- ammonia normal. Appreciate nephrology and medicine following. D/c Gabapentin. Decrease oxycodone to 2.5mg  q4H prn  ?Improving 18. Decreased appetite: continue megace- cleared  with nephrology 19. Insomnia: d/c trazodone and replace with Rozarem. Improved. Continue Rozarem.   -wife staying over helpful also 20. Lethargy: d/c klonopin. Improved. 21. Shortness of breath: improved after bilateral thoracotomy. Appreciate heart failure and critical care involvement  ?  Plan for Pleurx next week after chest x-ray per [facility] if symptomatic  LOS: 16 days A FACE TO FACE EVALUATION WAS PERFORMED  Betzabeth Derringer Lorie Phenix 08/19/2021, 8:43 AM

## 2021-08-19 NOTE — Progress Notes (Signed)
Occupational Therapy Session Note  Patient Details  Name: Maxwell Harmon MRN: [id number] Date of Birth: 08-Aug-1956  Today's Date: 08/19/2021 OT Individual Time: 3254-9826 OT Individual Time Calculation (min): 40 min    Short Term Goals: Week 3:  OT Short Term Goal 1 (Week 3): STG = LTG due ELOS  Skilled Therapeutic Interventions/Progress Updates:    Pt received in bed stating he got a good bath yesterday so today he wanted to get his pants on and get in the wc.  He declined the need for toileting. He sat up to EOB with S using rails and HOB elevated. He then completed a squat pivot to wc to his L with min A and cues for forward lean for adequate hip clearance. Pt needed to do 3 small squats pivots to fully get into wc.  From chair, donned pants over legs with supervision and then worked on lateral leans to get 70% of the way over his hips.  Removed B arm rests to allow for more room for more of a lean onto bed to his R and onto arm chair to his L.  The buldge of the incontinence brief was preventing him from fully pulling over back of buttocks.  So pt then pushed up on arm rests into semi stand for me to finish pulling over hips.  Pt then washed face and brushed teeth at sink.  Pt stated his dc date is next week.  Asked pt want he feels he really needs to improve on before going home and he said transfers.  Spent the rest of the session working on pushing up from wc arm rests to fully elevate hips and then practice shifting hips from side to side as he would need to do in a squat pivot.  Reviewed and practiced the importance of R foot placement and forward wt shift versus trying to push straight up.  Excellent participation.  Pt resting in wc with all needs met and belt alarm on.   Therapy Documentation Precautions:  Precautions Precautions: Fall Precaution Comments: wound vac Required Braces or Orthoses: Other Brace Other Brace: limb guard on the LLE Restrictions Weight Bearing  Restrictions: Yes LLE Weight Bearing: Non weight bearing    Vital Signs: Therapy Vitals Temp: 98.3 F (36.8 C) Temp Source: Oral Pulse Rate: 90 Resp: 19 BP: 137/79 Patient Position (if appropriate): Lying Oxygen Therapy SpO2: 97 % O2 Device: Room Air Pain: Pain Assessment Pain Score: 0-No pain    Therapy/Group: Individual Therapy  [place] 08/19/2021, 8:31 AM

## 2021-08-19 NOTE — Progress Notes (Signed)
Occupational Therapy Session Note  Patient Details  Name: CLEARNCE LEJA MRN: [id number] Date of Birth: 24-Aug-1956  Today's Date: 08/20/2021 OT Individual Time: 1345-1430 OT Individual Time Calculation (min): 45 min    Short Term Goals: Week 1:  OT Short Term Goal 1 (Week 1): Pt will perform LB dressing with MIN A OT Short Term Goal 1 - Progress (Week 1): Progressing toward goal OT Short Term Goal 2 (Week 1): Pt will complete MIN A stand > pivot transfer with LRAD to [facility] OT Short Term Goal 2 - Progress (Week 1): Revised due to lack of progress OT Short Term Goal 3 (Week 1): Pt will tolerate activity for 5 minutes w/o requiring external O2 support OT Short Term Goal 3 - Progress (Week 1): Progressing toward goal  Skilled Therapeutic Interventions/Progress Updates:    Pt greeted in the w/c with no c/o pain, opting to work on sit<stands in the room vs go to the gym. Worked on sit<stand technique with pt ultimately unable to complete due to decreased strength and low chair height. Therefore session focus was placed on general strengthening in prep for functional transfers at sit<stand level. Pt completed x8 one legged w/c push ups, able to maintain full buttocks clearance from w/c for 9 seconds at most. Discussed d/c plans for home with pt and wife. They already own a TTB and BSC, bathroom is fully w/c accessible and pt able to use the slideboard PTA for shower transfers. Pt required several rest breaks due to limited activity tolerance and strength deficits. Discussed using w/c push ups during toileting + LB dressing until he can safely do this at a sit<stand level. Per pt and wife, he was using this push up technique at home with family PTA. Pt remained sitting up in the w/c with all needs within reach and safety belt fastened.   Therapy Documentation Precautions:  Precautions Precautions: Fall Precaution Comments: wound vac Required Braces or Orthoses: Other Brace Other Brace: limb guard  on the LLE Restrictions Weight Bearing Restrictions: Yes LLE Weight Bearing: Non weight bearing Vital Signs: Therapy Vitals Temp: (!) 97.5 F (36.4 C) Temp Source: Oral Pulse Rate: 88 Resp: 15 BP: 135/81 Patient Position (if appropriate): Sitting Oxygen Therapy SpO2: 98 % O2 Device: Room Air ADL: ADL Eating: Set up Where Assessed-Eating: Bed level Grooming: Setup Where Assessed-Grooming: Wheelchair, Sitting at sink Upper Body Bathing: Setup Where Assessed-Upper Body Bathing: Edge of bed Lower Body Bathing: Moderate assistance Where Assessed-Lower Body Bathing: Edge of bed Upper Body Dressing: Minimal assistance Where Assessed-Upper Body Dressing: Edge of bed Lower Body Dressing: Maximal assistance Where Assessed-Lower Body Dressing: Edge of bed Toileting: Not assessed Toilet Transfer: Minimal assistance Toilet Transfer Method: Sit pivot Tub/Shower Transfer: Minimal assistance Tub/Shower Transfer Method: Sit pivot Tub/Shower Equipment: Facilities manager: Not assessed ADL Comments:  (anxiety with transfers d/t weakess)     Therapy/Group: Individual Therapy  Landin Tallon A Kj Imbert 08/20/2021, 3:43 PM

## 2021-08-19 NOTE — Progress Notes (Addendum)
[facility] Progress Note   Dialysis Orders:  MWF [facility]  [place]   Assessment/Plan: 1.  Status post revision of left below-knee amputation: Admitted to inpatient rehabilitation unit. 2. B/L pleural effusion: pulm on board, s/p rt thora 10/13 w/ 1.5L removed, left sided thora  3. ESRD: on HD MWF schedule -> plan for HD today. UF as tolerated; still has significant edema.  Tolerated HD on 10/14 with 3L net UF  No absolute indication for RRT and he is comfortable; next HD Mon.   4. Anemia of chronic disease: Hemoglobin 9's, resumed [name]200 ug weekly while here on Wed's. 5. CKD-MBD: Calcium level acceptable with improving phosphorus levels on [place]. 6. Nutrition: Ongoing renal diet with protein supplementation and renal multivitamin.  Fluid restriction. 7. Hypertension/volume excess: With episodes of intradialytic hypotension-on midodrine 10 mg twice daily. Has some dependent edema which has not been affected by HD for the most part.  8. Asterixis - not uremic and NH3 is wnl. Avoid gabapentin/ lyrica and excess narcotics as these can cause asterixis in ESRD pts.  Subjective: Seen in room. Feels better after thora 10/12 1.5L removed. Denies f/c/n/v/ CP. States that there were no issues on hd yest (no cramping or dizziness)   Objective Vitals:   08/18/21 1830 08/18/21 2028 08/19/21 0432 08/19/21 0500  BP: 127/74 134/74 137/79   Pulse: 86 87 90   Resp: 18 14 19    Temp: 97.7 F (36.5 C) 98.2 F (36.8 C) 98.3 F (36.8 C)   TempSrc:  Oral Oral   SpO2: 100% 98% 97%   Weight: 96 kg   96 kg  Height:         Additional Objective Labs: Basic Metabolic Panel: Recent Labs  Lab 08/18/21 1521  NA 132*  K 4.1  CL 97*  CO2 24  GLUCOSE 141*  BUN 62*  CREATININE 5.66*  CALCIUM 9.0  PHOS 1.5*   CBC: Recent Labs  Lab 08/18/21 1521  WBC 9.1  HGB 10.1*  HCT 33.2*  MCV 106.8*  PLT 161   Blood Culture    Component Value Date/Time   SDES FLUID  PLEURAL LEFT 08/17/2021 1051   SPECREQUEST NONE 08/17/2021 1051   CULT  08/17/2021 1051    NO GROWTH < 24 HOURS Performed at [facility], [place] [address] [place], [place] [address]    REPTSTATUS PENDING 08/17/2021 1051     Physical Exam General: nad Heart: RRR Lungs: cta bl Abdomen: soft non-tender  Extremities: L BKA, edema in UE and rt lower ext Neuro: alert this am Dialysis Access: LUE AVF +bruit  Medications:   vitamin C  1,000 mg Oral Daily   aspirin EC  81 mg Oral Daily   atorvastatin  40 mg Oral Daily   cinacalcet  30 mg Oral Weekly   darbepoetin ([name]) injection - DIALYSIS  200 mcg Intravenous Q Wed-HD   ferric citrate  420 mg Oral TID with meals   insulin aspart  0-6 Units Subcutaneous TID WC   linaclotide  290 mcg Oral QAC breakfast   midodrine  10 mg Oral TID WC   multivitamin  1 tablet Oral QHS   nutrition supplement (JUVEN)  1 packet Oral BID BM   pantoprazole  40 mg Oral Daily   polyethylene glycol  17 g Oral BID   ramelteon  8 mg Oral QHS   senna-docusate  2 tablet Oral BID   traZODone  50 mg Oral QHS   venlafaxine XR  37.5  mg Oral QPM

## 2021-08-20 LAB — GLUCOSE, CAPILLARY
Glucose-Capillary: 114 mg/dL — ABNORMAL HIGH (ref 70–99)
Glucose-Capillary: 142 mg/dL — ABNORMAL HIGH (ref 70–99)
Glucose-Capillary: 187 mg/dL — ABNORMAL HIGH (ref 70–99)
Glucose-Capillary: 143 mg/dL — ABNORMAL HIGH (ref 70–99)

## 2021-08-20 LAB — CYTOLOGY - NON PAP

## 2021-08-20 NOTE — Progress Notes (Signed)
Physical Therapy Session Note  Patient Details  Name: Maxwell Harmon MRN: [id number] Date of Birth: 15-Jan-1956  Today's Date: 08/20/2021 PT Individual Time: 1100-1155 PT Individual Time Calculation (min): 55 min   Short Term Goals: Week 2:  PT Short Term Goal 1 (Week 2): pt will transfer sit<>stand with LRAD and min A PT Short Term Goal 1 - Progress (Week 2): Progressing toward goal PT Short Term Goal 2 (Week 2): pt will transfer stand<>pivot with LRAD and min A PT Short Term Goal 2 - Progress (Week 2): Progressing toward goal PT Short Term Goal 3 (Week 2): pt will perform simulated car transfer with CGA PT Short Term Goal 3 - Progress (Week 2): Progressing toward goal Week 3:  PT Short Term Goal 1 (Week 3): STG=LTG due to LOS  Skilled Therapeutic Interventions/Progress Updates:   Received pt semi-reclined in bed asleep, upon wakening pt agreeable to PT treatment, and denied any pain during session. Session with emphasis on functional mobility/transfers, generalized strengthening, dynamic standing balance/coordination, and improved activity tolerance. Pt transferred semi-reclined<>sitting EOB with HOB elevated and use of bedrails with supervision. Donned R shoe with max A and transferred bed<>WC squat<>pivot with min A to R from deflated bed - cues for hand placement and transfer technique. Donned L limb guard with mod/max A and pt tranposrted to/from room in [facility] total A for time management purposes. Worked on set up for transfers - pt required min A for legrest management and min cues for overall set up. Pt transferred WC<>mat squat<>pivot with CGA and worked on blocked practice sit<>stands x 5 reps and mod A from 18in mat (working towards 17in height of WC) with emphasis on standing tolerance as pt reports it is more difficult to remain standing than it is to actually stand up. Pt able to stand on average ~45 seconds. Pt performed the following exercises sitting EOM with supervision and  verbal/visual cues for technique: -seated trunk rotations with 2.2lb medicine ball 2x10 -overhead shoulder press with 2lb dowel 2x10 -horizontal chest press with 2lb dowel 2x10 Sit<>stand with RW and mod A x 2 additional trials and worked on dynamic standing balance tossing horseshoes using RUE x 2 trials with min/mod A for balance. Pt demonstrated L lateral and posterior lean but able to self-correct majority of time. Squat<>pivot mat<>WC to L with CGA and requested to remain sitting in WC awaiting wife's arrival. Concluded session with pt sitting in [facility], needs within reach, and seatbelt alarm on. NT present checking blood glucose levels.   Therapy Documentation Precautions:  Precautions Precautions: Fall Precaution Comments: wound vac Required Braces or Orthoses: Other Brace Other Brace: limb guard on the LLE Restrictions Weight Bearing Restrictions: Yes LLE Weight Bearing: Non weight bearing  Therapy/Group: Individual Therapy Alfonse Alpers PT, DPT   08/20/2021, 7:23 AM

## 2021-08-20 NOTE — Progress Notes (Signed)
Occupational Therapy Session Note  Patient Details  Name: Maxwell Harmon MRN: [id number] Date of Birth: 01/06/1956  Today's Date: 08/20/2021 OT Individual Time: 0211-1552 OT Individual Time Calculation (min): 50 min    Short Term Goals: Week 1:  OT Short Term Goal 1 (Week 1): Pt will perform LB dressing with MIN A OT Short Term Goal 1 - Progress (Week 1): Progressing toward goal OT Short Term Goal 2 (Week 1): Pt will complete MIN A stand > pivot transfer with LRAD to [facility] OT Short Term Goal 2 - Progress (Week 1): Revised due to lack of progress OT Short Term Goal 3 (Week 1): Pt will tolerate activity for 5 minutes w/o requiring external O2 support OT Short Term Goal 3 - Progress (Week 1): Progressing toward goal Week 2:  OT Short Term Goal 1 (Week 2): Pt will perform LB dressing with MIN A OT Short Term Goal 1 - Progress (Week 2): Progressing toward goal OT Short Term Goal 2 (Week 2): Pt will complete MIN A squat pivot transfer to [facility] OT Short Term Goal 2 - Progress (Week 2): Progressing toward goal OT Short Term Goal 3 (Week 2): Pt will engage in self-care tasks for 5 minutes w/o requiring external O2 support OT Short Term Goal 3 - Progress (Week 2): Met OT Short Term Goal 4 (Week 2): Pt will complete sit > stand with min assist to decrease burden of care with LB dressing OT Short Term Goal 4 - Progress (Week 2): Discontinued (comment) (Discontinued due to LTG for sit > stand downgraded to mod A level) Week 3:  OT Short Term Goal 1 (Week 3): STG = LTG due ELOS  Skilled Therapeutic Interventions/Progress Updates:    1:1 PT DID GREAT TODAY!!!!!  Pt received from the bed. Pt able to transition to EOB with supervision and transfer lateral scoot into the w/c with min A. Participated in bathing and dressing at the sink. Pt declined to change brief or pants. Pt able to wash and dress UB with setup A. Pt does take extra time but able to complete. Pt able to doff limb sock and wash limb and  assist with donning clean sock on. (Washed other one and laid them to dry in the bathroom.). Pt required A to don new sock and shoe on right LE. Pt able to perform sit to stand at the sink with min A and achieve a fully upright position/ stance. When into the bathroom and practiced a stand pivot transfer into the shower with BSC and grab bars. Pt ended up doing a lateral scoot into the shower (to the [facility]) but then was able to do a stand pivot back to the w/c. Pt reported he feels like he has a mental block sometimes at getting up.   Returned to the sink and performed oral care. Pt then became nauseated and requested meds from RN. Wanted to return to the bed with contact guard squat pivot transfer back into bed. Able to lay back down with supervision.   Therapy Documentation Precautions:  Precautions Precautions: Fall Precaution Comments: wound vac Required Braces or Orthoses: Other Brace Other Brace: limb guard on the LLE Restrictions Weight Bearing Restrictions: Yes LLE Weight Bearing: Non weight bearing  Pain: Tenderness at the site of his residual limb   Therapy/Group: Individual Therapy  Willeen Cass [facility] 08/20/2021, 12:36 PM

## 2021-08-20 NOTE — Progress Notes (Signed)
[facility] Progress Note   Dialysis Orders:  MWF [facility]  [place]   Assessment/Plan: 1.  Status post revision of left below-knee amputation: Admitted to inpatient rehabilitation unit. 2. B/L pleural effusion: pulm on board, s/p rt thora 10/13 w/ 1.5L removed, left sided thora  3. ESRD: on HD MWF schedule -> plan for HD today. UF as tolerated; still has significant edema. HD on 10/14 3L net UF  No absolute indication for RRT and he is comfortable; next HD Mon. Edema is certainly improving.  4. Anemia of chronic disease: Hemoglobin 9's, resumed [name]200 ug weekly while here on Wed's. 5. CKD-MBD: Calcium level acceptable with improving phosphorus levels on [place]. 6. Nutrition: Ongoing renal diet with protein supplementation and renal multivitamin.  Fluid restriction. 7. Hypertension/volume excess: With episodes of intradialytic hypotension-on midodrine 10 mg twice daily. Has some dependent edema which has not been affected by HD for the most part.  8. Asterixis - not uremic and NH3 is wnl. Avoid gabapentin/ lyrica and excess narcotics as these can cause asterixis in ESRD pts.  Subjective: Seen in room. Feels that the swelling is improving.  Denies f/c/n/v/ CP.   Objective Vitals:   08/19/21 0500 08/19/21 1344 08/19/21 1954 08/20/21 0604  BP:  139/82 (!) 145/46 139/79  Pulse:  98 90 86  Resp:  16 17 18   Temp:  97.9 F (36.6 C) (!) 97 F (36.1 C) 97.9 F (36.6 C)  TempSrc:  Oral Oral   SpO2:  96% 96% 96%  Weight: 96 kg     Height:         Additional Objective Labs: Basic Metabolic Panel: Recent Labs  Lab 08/18/21 1521  NA 132*  K 4.1  CL 97*  CO2 24  GLUCOSE 141*  BUN 62*  CREATININE 5.66*  CALCIUM 9.0  PHOS 1.5*   CBC: Recent Labs  Lab 08/18/21 1521  WBC 9.1  HGB 10.1*  HCT 33.2*  MCV 106.8*  PLT 161   Blood Culture    Component Value Date/Time   SDES FLUID PLEURAL LEFT 08/17/2021 1051   SPECREQUEST NONE 08/17/2021 1051    CULT  08/17/2021 1051    NO GROWTH 2 DAYS Performed at [facility], [place] [address] [place], [place] [address]    REPTSTATUS PENDING 08/17/2021 1051     Physical Exam General: nad Heart: RRR Lungs: cta bl Abdomen: soft non-tender  Extremities: L BKA, edema in UE and rt lower ext Neuro: alert this am Dialysis Access: LUE AVF +bruit (I removed the tape carefully and fistula has nice thrill, no skin tears)  Medications:   vitamin C  1,000 mg Oral Daily   aspirin EC  81 mg Oral Daily   atorvastatin  40 mg Oral Daily   cinacalcet  30 mg Oral Weekly   darbepoetin ([name]) injection - DIALYSIS  200 mcg Intravenous Q Wed-HD   ferric citrate  420 mg Oral TID with meals   insulin aspart  0-6 Units Subcutaneous TID WC   linaclotide  290 mcg Oral QAC breakfast   midodrine  10 mg Oral TID WC   multivitamin  1 tablet Oral QHS   nutrition supplement (JUVEN)  1 packet Oral BID BM   pantoprazole  40 mg Oral Daily   polyethylene glycol  17 g Oral BID   ramelteon  8 mg Oral QHS   senna-docusate  2 tablet Oral BID   traZODone  50 mg Oral QHS   venlafaxine XR  37.5  mg Oral QPM

## 2021-08-21 ENCOUNTER — Telehealth: Payer: Self-pay | Admitting: Orthopedic Surgery

## 2021-08-21 ENCOUNTER — Inpatient Hospital Stay (HOSPITAL_COMMUNITY): Payer: Managed Care, Other (non HMO)

## 2021-08-21 LAB — GLUCOSE, CAPILLARY
Glucose-Capillary: 78 mg/dL (ref 70–99)
Glucose-Capillary: 114 mg/dL — ABNORMAL HIGH (ref 70–99)
Glucose-Capillary: 92 mg/dL (ref 70–99)
Glucose-Capillary: 221 mg/dL — ABNORMAL HIGH (ref 70–99)

## 2021-08-21 LAB — BODY FLUID CULTURE W GRAM STAIN: Culture: NO GROWTH

## 2021-08-21 LAB — CYTOLOGY - NON PAP

## 2021-08-21 MED ORDER — DOCUSATE SODIUM 100 MG PO CAPS
100.0000 mg | ORAL_CAPSULE | Freq: Three times a day (TID) | ORAL | Status: DC
  Administered 2021-08-22 – 2021-08-24 (×7): 100 mg via ORAL
  Filled 2021-08-21 (×7): qty 1

## 2021-08-21 MED ORDER — SENNOSIDES-DOCUSATE SODIUM 8.6-50 MG PO TABS
2.0000 | ORAL_TABLET | Freq: Every day | ORAL | Status: DC
  Administered 2021-08-22 – 2021-08-23 (×2): 2 via ORAL
  Filled 2021-08-21 (×2): qty 2

## 2021-08-21 NOTE — Progress Notes (Signed)
Physical Therapy Session Note  Patient Details  Name: Maxwell Harmon MRN: [id number] Date of Birth: 11-15-1955  Today's Date: 08/21/2021 PT Individual Time: 1100-1156 PT Individual Time Calculation (min): 56 min   Short Term Goals: Week 2:  PT Short Term Goal 1 (Week 2): pt will transfer sit<>stand with LRAD and min A PT Short Term Goal 1 - Progress (Week 2): Progressing toward goal PT Short Term Goal 2 (Week 2): pt will transfer stand<>pivot with LRAD and min A PT Short Term Goal 2 - Progress (Week 2): Progressing toward goal PT Short Term Goal 3 (Week 2): pt will perform simulated car transfer with CGA PT Short Term Goal 3 - Progress (Week 2): Progressing toward goal Week 3:  PT Short Term Goal 1 (Week 3): STG=LTG due to LOS  Skilled Therapeutic Interventions/Progress Updates:   Received pt sitting in WC, pt agreeable to PT treatment, and denied any pain during session and reported sleeping well last night. Session with emphasis on functional mobility/transfers, generalized strengthening dynamic standing balance/coordination, and improved activity tolerance. Pt performed WC mobility 27ft using BUE and supervision to fatigue and transported remainder of way to ortho gym in [facility] total A for energy conservation purposes. Worked on blocked practice sit<>stands from [facility]. Pt required multiple attempts and cues for technique, but ultimately able to stand x 3 reps to fatigue with mod/max A -1UE on RW and 1UE on WC armrest. Attempted to have pt raise onto R toes to practice lifting heel in preparation for stand<>pivots, but pt reported "shoe was sticking to the floor". Pt performed the following exercises sitting in WC with supervision and verbal cues for technique: -hip flexion 2x10 bilaterally with 1.5lb ankle weight on RLE -LAQ 2x10 on RLE with 1.5lb ankle weight -hip adduction ball squeezes 1x15 Pt transported back to room and requested to return to bed for dialysis. Lateral scoot WC<>bed to R with  CGA and doffed shoe and limb guard with total A. Sit<>supine with supervision. Concluded session with pt semi-reclined in bed with all needs within reach.   Therapy Documentation Precautions:  Precautions Precautions: Fall Precaution Comments: wound vac Required Braces or Orthoses: Other Brace Other Brace: limb guard on the LLE Restrictions Weight Bearing Restrictions: Yes LLE Weight Bearing: Non weight bearing  Therapy/Group: Individual Therapy Alfonse Alpers PT, DPT   08/21/2021, 7:32 AM

## 2021-08-21 NOTE — Progress Notes (Signed)
Occupational Therapy Session Note  Patient Details  Name: Maxwell Harmon MRN: [id number] Date of Birth: October 16, 1956  Today's Date: 08/21/2021 OT Individual Time: 1005-1034 OT Individual Time Calculation (min): 29 min    Short Term Goals: Week 2:  OT Short Term Goal 1 (Week 2): Pt will perform LB dressing with MIN A OT Short Term Goal 1 - Progress (Week 2): Progressing toward goal OT Short Term Goal 2 (Week 2): Pt will complete MIN A squat pivot transfer to [facility] OT Short Term Goal 2 - Progress (Week 2): Progressing toward goal OT Short Term Goal 3 (Week 2): Pt will engage in self-care tasks for 5 minutes w/o requiring external O2 support OT Short Term Goal 3 - Progress (Week 2): Met OT Short Term Goal 4 (Week 2): Pt will complete sit > stand with min assist to decrease burden of care with LB dressing OT Short Term Goal 4 - Progress (Week 2): Discontinued (comment) (Discontinued due to LTG for sit > stand downgraded to mod A level)  Skilled Therapeutic Interventions/Progress Updates:  Skilled OT intervention completed with focus on functional transfers. Pt received seated in w/c, agreeable to session. Pt donned shorts with min A at w/c level, with education provided about donning RLE first, then residual limb, with pt expressing that his wife would assist pulling shorts up if he could clear his bottom by doing w/c push up and pt demonstrating ability to do so. Discussed AE for home, with pt stating a reacher/shoe horn might be something he looks into to increase his independence. Pt expressed desire to attempt standing. Pt donned limb guard with min A needed for threading velcro straps through loops and stabilizing as pt pulled the guard up. Pt facing sink attempted sit > stand, 3 times, however pt unable. Cues provided for forward lean, and scooting forward in chair, however pt reported weakness in RLE during the attempts from the w/c vs elevated surface. Pt re-attempted sit > stand using RW,  however pt reported feeling too weak to stand and was unable. Education provided about completing LB clothing using w/c push ups and lateral leans due to decreased balance/endurance with family at home for safety. Pt in agreement stating that is his baseline. Pt left seated in w/c with alarm belt activated/donned, and all needs in reach at end of session.  Therapy Documentation Precautions:  Precautions Precautions: Fall Precaution Comments: wound vac Required Braces or Orthoses: Other Brace Other Brace: limb guard on the LLE Restrictions Weight Bearing Restrictions: Yes LLE Weight Bearing: Non weight bearing  Pain: Pt with no c/o pain during session.   Therapy/Group: Individual Therapy  [name] 08/21/2021, 7:42 AM

## 2021-08-21 NOTE — Telephone Encounter (Signed)
Called PA and advised that Dr. Sharol Given will be by to see the pt.

## 2021-08-21 NOTE — Progress Notes (Signed)
[facility] Progress Note   Dialysis Orders:  MWF [facility]  [place]    3.5 hrs     84kg    Heparin 2000  Assessment/Plan: 1.  Status post revision of left below-knee amputation: Admitted to inpatient rehabilitation unit. 2. B/L pleural effusion: s/p rt and lt thora, pulm following 3. ESRD: on HD MWF schedule -> plan for HD today. UF as tolerated; still has significant edema. HD on 10/14 3L net UF. Edema LE's is better.  4. Anemia of chronic disease: Hemoglobin 9's, resumed [name]200 ug weekly while here on Wed's. 5. CKD-MBD: Calcium level acceptable with improving phosphorus levels on [place]. 6. Nutrition: Ongoing renal diet with protein supplementation and renal multivitamin.  Fluid restriction. 7. Hypertension/volume excess: -on midodrine 10 mg twice daily. Has some dependent edema which is significantly better.  Cont to lower volume as BP tolerates.   8. Asterixis - not uremic and NH3 was wnl. Avoid gabapentin/ lyrica and excess narcotics as these can cause asterixis in ESRD pts. Much better on lower dose of narcotics.   Kelly Splinter, MD 08/21/2021, 11:30 AM      Subjective: Seen in room. Feels that the swelling is improving. Say she may be getting "tubes" to keep the fluid from collecting around his lungs.    Objective Vitals:   08/20/21 0604 08/20/21 1322 08/20/21 2118 08/21/21 0458  BP: 139/79 135/81 131/75 111/72  Pulse: 86 88 77 79  Resp: 18 15 16 16   Temp: 97.9 F (36.6 C) (!) 97.5 F (36.4 C) (!) 97.5 F (36.4 C) 97.6 F (36.4 C)  TempSrc:  Oral    SpO2: 96% 98% 96% 90%  Weight:      Height:         Additional Objective Labs: Basic Metabolic Panel: Recent Labs  Lab 08/18/21 1521  NA 132*  K 4.1  CL 97*  CO2 24  GLUCOSE 141*  BUN 62*  CREATININE 5.66*  CALCIUM 9.0  PHOS 1.5*    CBC: Recent Labs  Lab 08/18/21 1521  WBC 9.1  HGB 10.1*  HCT 33.2*  MCV 106.8*  PLT 161    Blood Culture    Component Value Date/Time    SDES FLUID PLEURAL LEFT 08/17/2021 1051   SPECREQUEST NONE 08/17/2021 1051   CULT  08/17/2021 1051    NO GROWTH 3 DAYS Performed at [facility], [place] [address] [place], [place] [address]    REPTSTATUS 08/21/2021 FINAL 08/17/2021 1051     Physical Exam General: nad Heart: RRR Lungs: cta bl Abdomen: soft non-tender  Extremities: L BKA, edema in UE and rt lower ext Neuro: alert this am Dialysis Access: LUE AVF +bruit  Medications:   vitamin C  1,000 mg Oral Daily   aspirin EC  81 mg Oral Daily   atorvastatin  40 mg Oral Daily   cinacalcet  30 mg Oral Weekly   darbepoetin ([name]) injection - DIALYSIS  200 mcg Intravenous Q Wed-HD   ferric citrate  420 mg Oral TID with meals   insulin aspart  0-6 Units Subcutaneous TID WC   linaclotide  290 mcg Oral QAC breakfast   midodrine  10 mg Oral TID WC   multivitamin  1 tablet Oral QHS   nutrition supplement (JUVEN)  1 packet Oral BID BM   pantoprazole  40 mg Oral Daily   polyethylene glycol  17 g Oral BID   ramelteon  8 mg Oral QHS   senna-docusate  2 tablet Oral  BID   traZODone  50 mg Oral QHS   venlafaxine XR  37.5 mg Oral QPM

## 2021-08-21 NOTE — Telephone Encounter (Signed)
Maxwell Harmon with in pt rehab called stating the pt had a revision amputation. Maxwell Harmon wanted to know if Dr.Duda wanted to come see him while he's still in the hospital? Or did he want to wait to see him in our office after the pt is discharged? Maxwell Harmon states if Dr.Duda wants to see him in the hospital his room number is [id number]. Maxwell Harmon would like a CB just to be updated.  Maxwell Harmon [phone]

## 2021-08-21 NOTE — Progress Notes (Signed)
Occupational Therapy Session Note  Patient Details  Name: Maxwell Harmon MRN: [id number] Date of Birth: 1956/03/06  Today's Date: 08/21/2021 OT Individual Time: 0800-0900 OT Individual Time Calculation (min): 60 min    Short Term Goals: Week 2:  OT Short Term Goal 1 (Week 2): Pt will perform LB dressing with MIN A OT Short Term Goal 1 - Progress (Week 2): Progressing toward goal OT Short Term Goal 2 (Week 2): Pt will complete MIN A squat pivot transfer to [facility] OT Short Term Goal 2 - Progress (Week 2): Progressing toward goal OT Short Term Goal 3 (Week 2): Pt will engage in self-care tasks for 5 minutes w/o requiring external O2 support OT Short Term Goal 3 - Progress (Week 2): Met OT Short Term Goal 4 (Week 2): Pt will complete sit > stand with min assist to decrease burden of care with LB dressing OT Short Term Goal 4 - Progress (Week 2): Discontinued (comment) (Discontinued due to LTG for sit > stand downgraded to mod A level)  Skilled Therapeutic Interventions/Progress Updates:  Skilled OT intervention completed with focus on functional transfers, and education regarding upcomming discharge. Pt received in long sitting in bed, alert and agreeable to therapy, on RA. SPO2 checked at 95% with pt asymptomatic and remained throughout rest of session. Pt with several questions regarding discharge, with education completed about shower/toilet transfers recommended to be completed at home, PLOF vs CLOF. Pt mentioning he doesn't want to be a burden on his family, with thoughts of seeking out a CNA to assist with ADLs a couple days a week. Pt educated that OT is recommending [place], and their purpose/support of ADL management once home, however discussed benefits of having assistance in home for safety if desired beyond therapies. Educated pt on energy conservation, and importance of rest breaks/spacing out high-energy tasks throughout the day, and recommendation to use caution on "low" days where pt's  energy is not at baseline for shower/higher level transfers, with plan to maybe complete those tasks when pt is on a "good" day. Pt in agreement and receptive. Completed bed mobility with CGA, EOB > w/c squat pivot with bed slightly elevated and min A. Practiced w/c <> BSC squat pivot SB transfer with CGA, with cues needed to continue lifting hips during transfer for ease of sliding, as SB is to help bridge the gap, with pt improving in technique with lifting both hips. Pt able to complete BSC push up on arm rests with supervision to clear bottom from seat for helper to assist with doffing pants, with pt reporting is at his baseline with help from wife. Pt report that he doesn't wear briefs at home due to absent voiding because of dialysis and has had bad constipation, with pt using BSC over toilet to usually have a BM and expressing the desire to continue that method once home. Pt demonstrated safe ability to complete transfer upon home, however encouraged to have help stabilizing SB due to decreased friction of the board and BSC lid/seat. While seated in w/c, pt completed grooming tasks at sink with set up. Pt opted to stay in w/c, with alarm belt activated/donned, and all needs in reach at end of session.  Therapy Documentation Precautions:  Precautions Precautions: Fall Precaution Comments: wound vac Required Braces or Orthoses: Other Brace Other Brace: limb guard on the LLE Restrictions Weight Bearing Restrictions: Yes LLE Weight Bearing: Non weight bearing  Pain: Pt with no complaints of pain during session.  Therapy/Group: Individual Therapy  Maxwell Harmon E Oaklee Sunga 08/21/2021, 7:41 AM

## 2021-08-21 NOTE — Progress Notes (Signed)
PROGRESS NOTE   Subjective/Complaints: Slept great last 2 nights Discussed palliative catheter for his pleural effusions and he would like to pursue Still without BM- agreeable to increasing stool softeners  ROS: Denies CP, SOB, N/V/D, +constipation  Objective:   DG CHEST PORT 1 VIEW  Result Date: 08/21/2021 CLINICAL DATA:  Encounter for pleural effusion. EXAM: PORTABLE CHEST - 1 VIEW COMPARISON:  08/18/2021 FINDINGS: Moderate bilateral pleural effusions. Consolidation/atelectasis in the lung bases. The perihilar interstitial opacities have slightly improved. Heart size difficult to assess due to adjacent opacities. No pneumothorax. Sternotomy wires. IMPRESSION: Persistent moderate pleural effusions, with improving perihilar infiltrates or edema. Electronically Signed   By: Lucrezia Europe M.D.   On: 08/21/2021 07:40   No results for input(s): WBC, HGB, HCT, PLT in the last 72 hours.    No results for input(s): NA, K, CL, CO2, GLUCOSE, BUN, CREATININE, CALCIUM in the last 72 hours.    Intake/Output Summary (Last 24 hours) at 08/21/2021 2112 Last data filed at 08/21/2021 1858 Gross per 24 hour  Intake 480 ml  Output 3000 ml  Net -2520 ml          Physical Exam: Vital Signs Blood pressure 121/68, pulse 77, temperature (!) 97.4 F (36.3 C), temperature source Oral, resp. rate 14, height 5\' 11"  (1.803 m), weight 92 kg, SpO2 94 %. Gen: no distress, normal appearing HEENT: oral mucosa pink and moist, NCAT Cardio: Reg rate Chest: normal effort, normal rate of breathing Abd: soft, non-distended Ext: no edema Skin: Warm and dry.  Left BKA with sutures. Psych: Normal mood.  Normal behavior. Musc: Left BKA with tenderness, improving edema Neuro: Alert  Motor: B/l UE grossly 4+/5.  RLE: 3+/5 prox to 4+/5 distally LLE: 3/5 proximally and limited by pain.  Decreased sensation to PP and LT right foot.   Assessment/Plan: 1.  Functional deficits which require 3+ hours per day of interdisciplinary therapy in a comprehensive inpatient rehab setting. Physiatrist is providing close team supervision and 24 hour management of active medical problems listed below. Physiatrist and rehab team continue to assess barriers to discharge/monitor patient progress toward functional and medical goals  Care Tool:  Bathing    Body parts bathed by patient: Right arm, Left arm, Chest, Abdomen, Front perineal area, Right upper leg, Left upper leg, Face   Body parts bathed by helper: Right lower leg, Buttocks Body parts n/a: Left lower leg   Bathing assist Assist Level: Minimal Assistance - Patient > 75%     Upper Body Dressing/Undressing Upper body dressing   What is the patient wearing?: Pull over shirt    Upper body assist Assist Level: Set up assist    Lower Body Dressing/Undressing Lower body dressing      What is the patient wearing?: Pants, Incontinence brief     Lower body assist Assist for lower body dressing: Minimal Assistance - Patient > 75%     Toileting Toileting    Toileting assist Assist for toileting: Maximal Assistance - Patient 25 - 49%     Transfers Chair/bed transfer  Transfers assist     Chair/bed transfer assist level: Contact Guard/Touching assist (lateral scoot)     Locomotion  Ambulation   Ambulation assist   Ambulation activity did not occur: Safety/medical concerns (weakness, decreased balance, fear of falling)  Assist level: Moderate Assistance - Patient 50 - 74% Assistive device: Walker-rolling Max distance: 2'   Walk 10 feet activity   Assist  Walk 10 feet activity did not occur: Safety/medical concerns (weakness, decreased balance, fear of falling)        Walk 50 feet activity   Assist Walk 50 feet with 2 turns activity did not occur: Safety/medical concerns (weakness, decreased balance, fear of falling)         Walk 150 feet activity   Assist Walk  150 feet activity did not occur: Safety/medical concerns (weakness, decreased balance, fear of falling)         Walk 10 feet on uneven surface  activity   Assist Walk 10 feet on uneven surfaces activity did not occur: Safety/medical concerns (weakness, decreased balance, fear of falling)         Wheelchair     Assist Is the patient using a wheelchair?: Yes Type of Wheelchair: Manual    Wheelchair assist level: Supervision/Verbal cueing Max wheelchair distance: 54ft    Wheelchair 50 feet with 2 turns activity    Assist        Assist Level: Supervision/Verbal cueing   Wheelchair 150 feet activity     Assist      Assist Level: Maximal Assistance - Patient 25 - 49%   Blood pressure 121/68, pulse 77, temperature (!) 97.4 F (36.3 C), temperature source Oral, resp. rate 14, height 5\' 11"  (1.803 m), weight 92 kg, SpO2 94 %.  Medical Problem List and Plan: 1.  Functional and mobility deficits secondary to PAD and left BKA/BKA revision 07/27/21   Continue [name] 2.  Impaired mobility: continue Heparin added             -antiplatelet therapy: Continue ASA, Plavix on hold per [facility] 3. Pain: d/c dilaudid. Oxycodone prn.              d/c'ed gabapentin given asterixis  Controlled with meds on 10/17 4. Anxiety: LCSW to follow for evaluation and support. D/c klonopin given respiratory status. Trying relaxation techniques.              -antipsychotic agents: N/A 5. Neuropsych: This patient is capable of making decisions on his own behalf. 6. Skin/Wound Care: Continue wound VAC for couple more days per ortho              --continue Vitamin C and Zinc with juven bid to promote wound healing.  7. Fluids/Electrolytes/Nutrition: Strict I/Os. Routine check of labs with HD. 8. T2DM: Monitor BS ac/hs and use SSI for elevated BS             --Continue insulin Glargine with 2 units Novolog TID CBG (last 3)  Recent Labs    08/21/21 1149 08/21/21 1703 08/21/21 2051  GLUCAP  114* 92 221*  -10/9: continue to hold semglee for now given sub-optimal po intake, novolog already stopped  Mildly elevated on 10/15 9. ESRD: HD MWF at the end of the day to help with tolerance of therapy.              --on Sensipar and ferric citrate for metabolic bone disease.   -palliative care consulted as per family request Increased midodrine to 10mg  TID so he can tolerate more ultrafiltration 10. Anemia of chronic disease: Started on weekly [name] 09/28.              --  routine H/H check with HD.   Hemoglobin 10.1 on 10/14 11. Hypotension: Increase midodrine to 10mg  TID so he can tolerate more ultrafiltration  Controlled on 10/15 12. Depression with acute reaction?: Continue Effexor  -wife here for support  -team providing egosupport as well 13. CAD: Lipitor. Monitor for symptoms with increase in activity.   See #2 14.  PAD: Continue Trental TID.  15.  Constipation: had BM with SMOG enema, added Linzess, increase colace to TID   Bowel meds increased again on 10/15 16. Nausea: Improved at present.  17. Bilateral hand tremor: unknown cause- ammonia normal. Appreciate nephrology and medicine following. D/c Gabapentin. Decrease oxycodone to 2.5mg  q4H prn  ?Improving 18. Decreased appetite: continue megace- cleared with nephrology 19. Insomnia: Continue Rozarem and Trazodone.  -wife staying over helpful also 20. Lethargy: d/c klonopin. Improved. 21. Shortness of breath: improved after bilateral thoracotomy. Appreciate heart failure and critical care involvement  Plan for inpatient vs outpatient Pleurx catheter  LOS: 18 days A FACE TO FACE EVALUATION WAS PERFORMED  Maxwell Harmon 08/21/2021, 9:12 PM

## 2021-08-22 LAB — GLUCOSE, CAPILLARY
Glucose-Capillary: 88 mg/dL (ref 70–99)
Glucose-Capillary: 125 mg/dL — ABNORMAL HIGH (ref 70–99)
Glucose-Capillary: 194 mg/dL — ABNORMAL HIGH (ref 70–99)
Glucose-Capillary: 169 mg/dL — ABNORMAL HIGH (ref 70–99)

## 2021-08-22 LAB — CBC
HCT: 34.7 % — ABNORMAL LOW (ref 39.0–52.0)
Hemoglobin: 10.3 g/dL — ABNORMAL LOW (ref 13.0–17.0)
MCH: 31.6 pg (ref 26.0–34.0)
MCHC: 29.7 g/dL — ABNORMAL LOW (ref 30.0–36.0)
MCV: 106.4 fL — ABNORMAL HIGH (ref 80.0–100.0)
Platelets: 138 10*3/uL — ABNORMAL LOW (ref 150–400)
RBC: 3.26 MIL/uL — ABNORMAL LOW (ref 4.22–5.81)
RDW: 18.2 % — ABNORMAL HIGH (ref 11.5–15.5)
WBC: 6.3 10*3/uL (ref 4.0–10.5)
nRBC: 0 % (ref 0.0–0.2)

## 2021-08-22 MED ORDER — OXYCODONE HCL 5 MG PO TABS: 2.5000 mg | ORAL_TABLET | Freq: Four times a day (QID) | ORAL | Status: DC | PRN

## 2021-08-22 MED ORDER — PSYLLIUM 95 % PO PACK
1.0000 | PACK | Freq: Every day | ORAL | Status: DC
  Administered 2021-08-22: 1 via ORAL
  Filled 2021-08-22 (×3): qty 1

## 2021-08-22 NOTE — Progress Notes (Signed)
Occupational Therapy Session Note  Patient Details  Name: Maxwell Harmon MRN: [id number] Date of Birth: 08/06/1956  Today's Date: 08/22/2021 OT Individual Time: 1305-1400 OT Individual Time Calculation (min): 55 min    Short Term Goals: Week 2:  OT Short Term Goal 1 (Week 2): Pt will perform LB dressing with MIN A OT Short Term Goal 1 - Progress (Week 2): Progressing toward goal OT Short Term Goal 2 (Week 2): Pt will complete MIN A squat pivot transfer to BSC OT Short Term Goal 2 - Progress (Week 2): Progressing toward goal OT Short Term Goal 3 (Week 2): Pt will engage in self-care tasks for 5 minutes w/o requiring external O2 support OT Short Term Goal 3 - Progress (Week 2): Met OT Short Term Goal 4 (Week 2): Pt will complete sit > stand with min assist to decrease burden of care with LB dressing OT Short Term Goal 4 - Progress (Week 2): Discontinued (comment) (Discontinued due to LTG for sit > stand downgraded to mod A level)  Skilled Therapeutic Interventions/Progress Updates:  Skilled OT intervention with focus on family education as pt prepares for discharge. Pt received seated in w/c with wife and sister present. Addressed questions and education provided about therapy recommendations of using SB for all transfers vs standing, with squat pivots as needed and on "good days", due to decreased strength and for safety. Pt completed w/c  <> BSC with SB and CGA, with wife return demonstrating the ability with safe body mechanics and safety with pt as well. Discussed that pt has all DME, with suggestion of altering the height of the BSC/TTB to be equal level with w/c since pt with utilizing SB versus stand pivot like PTA. Family receptive and agreeable. Completed w/c <> TTB using SB and min A for managing the RLE over the raised tub edge. Advised with SB transfers to family and pt that pt have clothing on to ease with gliding, or utilizing towel/pillow case when in the shower vs gliding wet.  Informed family of need for skin inspection daily as well as completing BUE HEP at home using thera-band for increasing strength for ADL management and functional transfers. Discussed in detail, pt's CLOF as pt's family reported he used to stand pivot for a lot of transfers, including with clothing management. Advised family to have pt complete clothing management seated using lateral leans/push ups for min A to donn pants for safety and increased independence, with the plan that HHOT would be able to address bathroom set up/plan for dressing further once d/c home and in home environment to better assess safety. Pt left seated in w/c, with family left at pt's side at end of session.  Therapy Documentation Precautions:  Precautions Precautions: Fall Precaution Comments: wound vac Required Braces or Orthoses: Other Brace Other Brace: limb guard on the LLE Restrictions Weight Bearing Restrictions: Yes LLE Weight Bearing: Non weight bearing  Pain: Pt with no c/o pain today.   Therapy/Group: Individual Therapy   E  08/22/2021, 7:42 AM 

## 2021-08-22 NOTE — Progress Notes (Signed)
Physical Therapy Session Note  Patient Details  Name: Maxwell Harmon MRN: [id number] Date of Birth: 12/04/1955  Today's Date: 08/22/2021 PT Individual Time: 2878-6767 PT Individual Time Calculation (min): 43 min   Today's Date: 08/22/2021 PT Missed Time: 17 Minutes Missed Time Reason: Patient fatigue  Short Term Goals: Week 2:  PT Short Term Goal 1 (Week 2): pt will transfer sit<>stand with LRAD and min A PT Short Term Goal 1 - Progress (Week 2): Progressing toward goal PT Short Term Goal 2 (Week 2): pt will transfer stand<>pivot with LRAD and min A PT Short Term Goal 2 - Progress (Week 2): Progressing toward goal PT Short Term Goal 3 (Week 2): pt will perform simulated car transfer with CGA PT Short Term Goal 3 - Progress (Week 2): Progressing toward goal Week 3:  PT Short Term Goal 1 (Week 3): STG=LTG due to LOS  Skilled Therapeutic Interventions/Progress Updates:   Received pt sitting in [facility] with wife and sister present for family education training. Pt agreeable to PT treatment and denied any pain during session but reported feeling fatigued and not sleeping well last night. Session with emphasis on discharge planning, functional mobility/transfers, generalized strengthening, simulated car transfers, dynamic standing balance/coordination, and improved endurance with activity. Pt transported to/from room in [facility] total A for time management and energy conservation purposes. Discussed car transfer methods as pt reported he and his wife have had lots of practice with car transfers going to/from dialysis. Pt reports using slideboard on his "bad days" but able to stand<>pivot on his "good days" holding onto doorframe. Pt initially requested to try standing and pivoting, however pt unable to safely stand up with therapist and wanting to grab onto mobile doorframe despite recommendation not to. Ultimately decided to use slideboard and performed transfer with slideboard and min A provided by pt's wife  with cues for board placement, body mechanics/hand postioning, scooting technique, and positioning of pt's LEs when entering/exiting the car. Pt and wife verbalized confidence with transfer and declined practicing again. Therapist emphasized recommendation to use slideboard for car transfers rather than standing and pivoting for safety reasons and pt/family verbalized understanding and in agreement. Also educated pt/family on avoiding trying to "hop" due to fatigue, weakness, and decreased balance and risks of excessive force to R knee/foot. Pt transferred sit<>stand with RW from [place] x 1 rep with mod A from therapist. Attempted to stand x 2 additional trials with assist from wife, but ultimately unable due to reports of weakness and fatigue. Pt requested to return to room and pt's wife with questions regarding getting transport chair as option to have due to heaviness of current WC; reached out to [place]. Concluded session with pt sitting in WC, needs within reach, and seatbelt alarm on. Family present at bedside. 17 minutes missed of skilled physical therapy due to fatigue.   Therapy Documentation Precautions:  Precautions Precautions: Fall Precaution Comments: wound vac Required Braces or Orthoses: Other Brace Other Brace: limb guard on the LLE Restrictions Weight Bearing Restrictions: Yes LLE Weight Bearing: Non weight bearing  Therapy/Group: Individual Therapy Alfonse Alpers PT, DPT   08/22/2021, 7:34 AM

## 2021-08-22 NOTE — Patient Care Conference (Signed)
Inpatient RehabilitationTeam Conference and Plan of Care Update Date: 08/22/2021   Time: 1:30 PM    Patient Name: Maxwell Harmon      Medical Record Number: [id number]  Date of Birth: 11-27-1955 Sex: Male         Room/Bed: 4M09C/4M09C-01 Payor Info: Payor: C[employer] / Plan: CIGNA MANAGED / Product Type: *No Product type* /    Admit Date/Time:  08/03/2021  3:36 PM  Primary Diagnosis:  S/P BKA (below knee amputation) unilateral, left [facility])  Hospital Problems: Principal Problem:   S/P BKA (below knee amputation) unilateral, left ([place]) Active Problems:   PAD (peripheral artery disease) (HCC)   Pleural effusion   S/P thoracentesis   Chronic insomnia   Hemodialysis-associated hypotension   ESRD on dialysis [facility])    Expected Discharge Date: Expected Discharge Date: 08/24/21  Team Members Present: Physician leading conference: Dr. Leeroy Cha Social Worker Present: Erlene Quan, BSW Nurse Present: Dorthula Nettles, RN PT Present: Ginnie Smart, PT OT Present: Leretha Pol, OT PPS Coordinator present : Gunnar Fusi, SLP     Current Status/Progress Goal Weekly Team Focus  Bowel/Bladder   Oliguric, continect to bowel LBM 10/17  resolve constipation  bowel medications   Swallow/Nutrition/ Hydration             ADL's   Set up UB bathing/dress at sink, min A LB bathing/dressing at w/c level, min A squat pivot transfer, CGA slide board transfers, able to complete SB transfer w/c<>BSC with CGA for toileting however has not been toileting on BSC due to constipation/not voiding due to dialysis  Mod A for ADLs, CGA transfers  Care of residual limb, tub/shower transfers to TTB, ADL retraining, d/c readiness, BUE/core strengthening   Mobility   bed mobility supervision, lateral scoot/squat<>pivot transfers CGA/min A, sit<>stand from elevated surfaces with RW mod A - pt does fluctuate  CGA/supervision overall, Mod I WC mobility  functional mobility/transfers, generalized  strengthening, dynamic standing balance/coordination, amputee education, D/C planning, family education, and endurance.   Communication             Safety/Cognition/ Behavioral Observations            Pain   pain controlled on current regimen, pain 4/10 low dose of oxy  < 3  assess pain q 4hr and prn   Skin   Left BKA, sutures, no current drainage or signs of infection  wound to show signs of healing  assess skin q shift and prn     Discharge Planning:  Pt discharing home with spouse. [place] set. Pt should have all DME per therapy.that insurance will cover the bottles for Pleur X catheter?   Team Discussion: Much improved after Thoracotomy. Constipated, added Psyllium fiber, Senna. Added Ramelteon nightly. HD MWF, continent bowel, LBM 10/15. Reports 6/10 pain. Can remove right arm IV. Left leg incision, right back wound, look ok. Pleurx catheters for pleural effusions to be ordered. [place] set up. Patient on target to meet rehab goals: yes, mod assist with ADL's, contact guard with transfers. Contact guard/supervision overall.  *See Care Plan and progress notes for long and short-term goals.   Revisions to Treatment Plan:  Adjusting medications.  Teaching Needs: Family education, medication management, pain management, skin/wound care, bowel management, safety awareness.  Current Barriers to Discharge: Decreased caregiver support, Home enviroment access/layout, Wound care, Lack of/limited family support, Weight, Weight bearing restrictions, and Medication compliance  Possible Resolutions to Barriers: Family education, [place] set up with [place], patient has all DME needed.  Working to see if insurance covers Pleurx catheters.     Medical Summary Current Status: residual limb pain and wound, insomnia, ESRD, constipation, pleural effusions, hypotension  Barriers to Discharge: Medical stability  Barriers to Discharge Comments: residual limb pain and wound, insomnia, ESRD, constipation,  pleural effusions, hypotension Possible Resolutions to [employer] Focus: decrease oxycodone to 2.5mg  q6H prn, continue trazodone and ramelteon, add psyllium fiber, continue increased midodrine to TID, plan for outpatient Pleurx catheters with pulmonology   Continued Need for Acute Rehabilitation Level of Care: The patient requires daily medical management by a physician with specialized training in physical medicine and rehabilitation for the following reasons: Direction of a multidisciplinary physical rehabilitation program to maximize functional independence : Yes Medical management of patient stability for increased activity during participation in an intensive rehabilitation regime.: Yes Analysis of laboratory values and/or radiology reports with any subsequent need for medication adjustment and/or medical intervention. : Yes   I attest that I was present, lead the team conference, and concur with the assessment and plan of the team.   Cristi Loron 08/22/2021, 5:01 PM

## 2021-08-22 NOTE — Progress Notes (Signed)
Patient ID: Maxwell Harmon, male   DOB: 1956/03/23, 65 y.o.   MRN: [id number]  Pt approved by [facility]

## 2021-08-22 NOTE — Progress Notes (Signed)
Patient ID: Maxwell Harmon, male   DOB: 1956-08-09, 65 y.o.   MRN: [id number] Patient is seen in follow-up for a revised left transtibial amputation.  Examination at this time the incision is well approximated there is good consolidation of the residual limb there is some small superficial ischemic skin changes but overall his leg looks great.  He will follow-up with me in the office and call for appointment a week from this Thursday.

## 2021-08-22 NOTE — Progress Notes (Signed)
PROGRESS NOTE   Subjective/Complaints: 6/10 pain during therapy, none at rest- trying to use less oxycodone and have weaned to 2.5mg  q6H prn Still constipated- added psyllium fiber today Did not sleep well last night  ROS: Denies CP, SOB, N/V/D, +constipation, +insomnia  Objective:   DG CHEST PORT 1 VIEW  Result Date: 08/21/2021 CLINICAL DATA:  Encounter for pleural effusion. EXAM: PORTABLE CHEST - 1 VIEW COMPARISON:  08/18/2021 FINDINGS: Moderate bilateral pleural effusions. Consolidation/atelectasis in the lung bases. The perihilar interstitial opacities have slightly improved. Heart size difficult to assess due to adjacent opacities. No pneumothorax. Sternotomy wires. IMPRESSION: Persistent moderate pleural effusions, with improving perihilar infiltrates or edema. Electronically Signed   By: Lucrezia Europe M.D.   On: 08/21/2021 07:40   Recent Labs    08/22/21 0520  WBC 6.3  HGB 10.3*  HCT 34.7*  PLT 138*      No results for input(s): NA, K, CL, CO2, GLUCOSE, BUN, CREATININE, CALCIUM in the last 72 hours.    Intake/Output Summary (Last 24 hours) at 08/22/2021 1113 Last data filed at 08/21/2021 1858 Gross per 24 hour  Intake 240 ml  Output 3000 ml  Net -2760 ml          Physical Exam: Vital Signs Blood pressure 129/78, pulse 87, temperature 98.1 F (36.7 C), temperature source Oral, resp. rate 14, height 5\' 11"  (1.803 m), weight 92 kg, SpO2 98 %. Gen: no distress, normal appearing HEENT: oral mucosa pink and moist, NCAT Cardio: Reg rate Chest: normal effort, normal rate of breathing Abd: soft, non-distended Ext: no edema Psych: pleasant, normal affect  Skin: Warm and dry.  Left BKA with sutures. Psych: Normal mood.  Normal behavior. Musc: Left BKA with tenderness, improving edema Neuro: Alert  Motor: B/l UE grossly 4+/5.  RLE: 3+/5 prox to 4+/5 distally LLE: 3/5 proximally and limited by pain.   Decreased sensation to PP and LT right foot.   Assessment/Plan: 1. Functional deficits which require 3+ hours per day of interdisciplinary therapy in a comprehensive inpatient rehab setting. Physiatrist is providing close team supervision and 24 hour management of active medical problems listed below. Physiatrist and rehab team continue to assess barriers to discharge/monitor patient progress toward functional and medical goals  Care Tool:  Bathing    Body parts bathed by patient: Right arm, Left arm, Chest, Abdomen, Front perineal area, Right upper leg, Left upper leg, Face   Body parts bathed by helper: Right lower leg, Buttocks Body parts n/a: Left lower leg   Bathing assist Assist Level: Minimal Assistance - Patient > 75%     Upper Body Dressing/Undressing Upper body dressing   What is the patient wearing?: Pull over shirt    Upper body assist Assist Level: Set up assist    Lower Body Dressing/Undressing Lower body dressing      What is the patient wearing?: Pants, Incontinence brief     Lower body assist Assist for lower body dressing: Minimal Assistance - Patient > 75%     Toileting Toileting    Toileting assist Assist for toileting: Maximal Assistance - Patient 25 - 49%     Transfers Chair/bed transfer  Transfers assist  Chair/bed transfer assist level: Contact Guard/Touching assist (lateral scoot)     Locomotion Ambulation   Ambulation assist   Ambulation activity did not occur: Safety/medical concerns (weakness, decreased balance, fear of falling)  Assist level: Moderate Assistance - Patient 50 - 74% Assistive device: Walker-rolling Max distance: 2'   Walk 10 feet activity   Assist  Walk 10 feet activity did not occur: Safety/medical concerns (weakness, decreased balance, fear of falling)        Walk 50 feet activity   Assist Walk 50 feet with 2 turns activity did not occur: Safety/medical concerns (weakness, decreased balance,  fear of falling)         Walk 150 feet activity   Assist Walk 150 feet activity did not occur: Safety/medical concerns (weakness, decreased balance, fear of falling)         Walk 10 feet on uneven surface  activity   Assist Walk 10 feet on uneven surfaces activity did not occur: Safety/medical concerns (weakness, decreased balance, fear of falling)         Wheelchair     Assist Is the patient using a wheelchair?: Yes Type of Wheelchair: Manual    Wheelchair assist level: Supervision/Verbal cueing Max wheelchair distance: 33ft    Wheelchair 50 feet with 2 turns activity    Assist        Assist Level: Supervision/Verbal cueing   Wheelchair 150 feet activity     Assist      Assist Level: Maximal Assistance - Patient 25 - 49%   Blood pressure 129/78, pulse 87, temperature 98.1 F (36.7 C), temperature source Oral, resp. rate 14, height 5\' 11"  (1.803 m), weight 92 kg, SpO2 98 %.  Medical Problem List and Plan: 1.  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  Impaired mobility: continue Heparin added             -antiplatelet therapy: Continue ASA, Plavix on hold per [facility] 3. Pain: d/c dilaudid. Oxycodone prn.              d/c'ed gabapentin given asterixis  Controlled with meds on 10/17 4. Anxiety: LCSW to follow for evaluation and support. D/c klonopin given respiratory status. Trying relaxation techniques.              -antipsychotic agents: N/A 5. Neuropsych: This patient is capable of making decisions on his own behalf. 6. Skin/Wound Care: Continue wound VAC for couple more days per ortho              --continue Vitamin C and Zinc with juven bid to promote wound healing.  7. Fluids/Electrolytes/Nutrition: Strict I/Os. Routine check of labs with HD. 8. T2DM: Monitor BS ac/hs and use SSI for elevated BS             --Continue insulin Glargine  with 2 units Novolog TID CBG (last 3)  Recent Labs    08/21/21 1703 08/21/21 2051 08/22/21 0556  GLUCAP 92 221* 88  -10/9: continue to hold semglee for now given sub-optimal po intake, novolog already stopped  Mildly elevated on 10/15 9. ESRD: HD MWF at the end of the day to help with tolerance of therapy.              --on Sensipar and ferric citrate for metabolic bone disease.   -palliative care consulted as per family request Increased midodrine to 10mg  TID so he can  tolerate more ultrafiltration 10. Anemia of chronic disease: Started on weekly [name] 09/28.              --routine H/H check with HD.   Hemoglobin 10.1 on 10/14 11. Hypotension: Increase midodrine to 10mg  TID so he can tolerate more ultrafiltration  Controlled on 10/15 12. Depression with acute reaction?: Continue Effexor  -wife here for support  -team providing egosupport as well 13. CAD: Lipitor. Monitor for symptoms with increase in activity.   See #2 14.  PAD: Continue Trental TID.  15.  Constipation: had BM with SMOG enema, added Linzess, increase colace to TID. Add psyllium   Bowel meds increased again on 10/15 16. Nausea: Improved at present.  17. Bilateral hand tremor: unknown cause- ammonia normal. Appreciate nephrology and medicine following. D/c Gabapentin. Decrease oxycodone to 2.5mg  q4H prn  ?Improving 18. Decreased appetite: continue megace- cleared with nephrology 19. Insomnia: Continue Rozarem and Trazodone.  -wife staying over helpful also 20. Lethargy: d/c klonopin. Improved. 21. Shortness of breath: improved after bilateral thoracotomy. Appreciate heart failure and critical care involvement  Plan for outpatient Pleurx catheter as insurance authorization needed. Discussed benefits of this procedure with patient.   LOS: 19 days A FACE TO FACE EVALUATION WAS PERFORMED  Maxwell Harmon Maxwell Harmon 08/22/2021, 11:13 AM

## 2021-08-22 NOTE — Progress Notes (Addendum)
[facility] Progress Note   Dialysis Orders:  MWF [facility]  [place]    3.5 hrs     84kg    Heparin 2000  LUA AVF  Assessment/Plan: Status post revision of left below-knee amputation: Admitted to inpatient rehabilitation unit. B/L pleural effusion: s/p rt and lt thora, pulm following Hypertension/volume excess: -on midodrine 10 mg twice daily. +dependent edema which is significantly better.  Cont to lower volume as BP tolerates. Weights are off , tried to stand him up today but he is not strong enough to stand up.  ESRD: on HD MWF. HD tomorrow.  Anemia of chronic disease: Hemoglobin 9's, resumed [name]200 ug weekly while here on Wed's. CKD-MBD: Calcium level acceptable with improving phosphorus levels on [place]. Nutrition: Ongoing renal diet with protein supplementation and renal multivitamin.  Fluid restriction. Asterixis - not uremic and NH3 was wnl. Much better on lower dose of narcotics. Gabapentin dc'd.   Kelly Splinter, MD 08/22/2021, 12:20 PM      Subjective: Seen in room. No new c/o's today.   Physical Exam General: nad Heart: RRR Lungs: cta bl Abdomen: soft non-tender  Extremities: L BKA, edema in UE and rt lower ext Neuro: alert this am Dialysis Access: LUE AVF +bruit    Objective Vitals:   08/21/21 1615 08/21/21 1704 08/21/21 2024 08/22/21 0457  BP: 113/66 124/73 121/68 129/78  Pulse:  87 77 87  Resp:  18 14 14   Temp: 97.8 F (36.6 C) (!) 97.5 F (36.4 C) (!) 97.4 F (36.3 C) 98.1 F (36.7 C)  TempSrc:  Oral Oral Oral  SpO2: 100% 99% 94% 98%  Weight:      Height:         Additional Objective Labs: Basic Metabolic Panel: Recent Labs  Lab 08/18/21 1521  NA 132*  K 4.1  CL 97*  CO2 24  GLUCOSE 141*  BUN 62*  CREATININE 5.66*  CALCIUM 9.0  PHOS 1.5*    CBC: Recent Labs  Lab 08/18/21 1521 08/22/21 0520  WBC 9.1 6.3  HGB 10.1* 10.3*  HCT 33.2* 34.7*  MCV 106.8* 106.4*  PLT 161 138*    Blood Culture     Component Value Date/Time   SDES FLUID PLEURAL LEFT 08/17/2021 1051   SPECREQUEST NONE 08/17/2021 1051   CULT  08/17/2021 1051    NO GROWTH 3 DAYS Performed at [facility], [place] [address] [place], [place] [address]    REPTSTATUS 08/21/2021 FINAL 08/17/2021 1051       Medications:   vitamin C  1,000 mg Oral Daily   aspirin EC  81 mg Oral Daily   atorvastatin  40 mg Oral Daily   cinacalcet  30 mg Oral Weekly   darbepoetin ([name]) injection - DIALYSIS  200 mcg Intravenous Q Wed-HD   docusate sodium  100 mg Oral TID   ferric citrate  420 mg Oral TID with meals   insulin aspart  0-6 Units Subcutaneous TID WC   linaclotide  290 mcg Oral QAC breakfast   midodrine  10 mg Oral TID WC   multivitamin  1 tablet Oral QHS   nutrition supplement (JUVEN)  1 packet Oral BID BM   pantoprazole  40 mg Oral Daily   polyethylene glycol  17 g Oral BID   psyllium  1 packet Oral Daily   ramelteon  8 mg Oral QHS   senna-docusate  2 tablet Oral QHS   traZODone  50 mg Oral QHS   venlafaxine XR  37.5 mg Oral QPM

## 2021-08-22 NOTE — Progress Notes (Signed)
Occupational Therapy Session Note  Patient Details  Name: Maxwell Harmon MRN: [id number] Date of Birth: [birth year]  Today's Date: 08/22/2021 OT Individual Time: 2233-6122 OT Individual Time Calculation (min): 30 min    Short Term Goals: Week 2:  OT Short Term Goal 1 (Week 2): Pt will perform LB dressing with MIN A OT Short Term Goal 1 - Progress (Week 2): Progressing toward goal OT Short Term Goal 2 (Week 2): Pt will complete MIN A squat pivot transfer to [facility] OT Short Term Goal 2 - Progress (Week 2): Progressing toward goal OT Short Term Goal 3 (Week 2): Pt will engage in self-care tasks for 5 minutes w/o requiring external O2 support OT Short Term Goal 3 - Progress (Week 2): Met OT Short Term Goal 4 (Week 2): Pt will complete sit > stand with min assist to decrease burden of care with LB dressing OT Short Term Goal 4 - Progress (Week 2): Discontinued (comment) (Discontinued due to LTG for sit > stand downgraded to mod A level)  Skilled Therapeutic Interventions/Progress Updates:  Skilled OT intervention completed with focus on family education with one of pt's daughters. Pt received in room seated in bed with daughter present with pt requesting to finish breakfast that his daughter brought. Discussed DME in the home, rehab goals and POC as pt and daughter had several questions about pt's CLOF and if pt is ready to return home. Pt advised that he is moving towards meeting his goals, and is able to participate to a functional level with necessary assistance in all ADL tasks. Discussed PLOF in home, with pt receptive that he is functioning close to his baseline and feels more ready to go home, but is still concerned with being a burden. Addressed the topic of pt hiring a CNA, as well as HHOT's purpose once home. Daughter expressed that she feels more confident than a couple weeks ago with how he is moving but reports pt's wife will primarily be the one assisting him, who is scheduled to complete  formal education in later session. Provided education regarding limb inspection (pt states he has mirror at home), and residual limb care once home. Provided amputation educational booklet for a resource to refer to once home, as well as answering questions that pt had regarding prosthesis and the process of that if/when the time comes, also using the booklet to provide visual examples. Pt left seated in long sitting, with daughter in room, and all needs in reach at end of session.    Therapy Documentation Precautions:  Precautions Precautions: Fall Precaution Comments: wound vac Required Braces or Orthoses: Other Brace Other Brace: limb guard on the LLE Restrictions Weight Bearing Restrictions: Yes LLE Weight Bearing: Non weight bearing  Pain: Pt with no c/o pain during session.   Therapy/Group: Individual Therapy  Teighan Aubert E Maizy Davanzo 08/22/2021, 7:41 AM

## 2021-08-23 ENCOUNTER — Encounter (HOSPITAL_COMMUNITY)
Admission: RE | Disposition: A | Discharge status: Home Health | Payer: Self-pay | Source: Intra-hospital | Attending: Physical Medicine and Rehabilitation

## 2021-08-23 LAB — RENAL FUNCTION PANEL
Albumin: 2.8 g/dL — ABNORMAL LOW (ref 3.5–5.0)
Anion gap: 7 (ref 5–15)
BUN: 67 mg/dL — ABNORMAL HIGH (ref 8–23)
CO2: 28 mmol/L (ref 22–32)
Calcium: 8.8 mg/dL — ABNORMAL LOW (ref 8.9–10.3)
Chloride: 100 mmol/L (ref 98–111)
Creatinine, Ser: 6.46 mg/dL — ABNORMAL HIGH (ref 0.61–1.24)
GFR, Estimated: 9 mL/min — ABNORMAL LOW (ref >60)
Glucose, Bld: 154 mg/dL — ABNORMAL HIGH (ref 70–99)
Phosphorus: 2 mg/dL — ABNORMAL LOW (ref 2.5–4.6)
Potassium: 4.9 mmol/L (ref 3.5–5.1)
Sodium: 135 mmol/L (ref 135–145)

## 2021-08-23 LAB — GLUCOSE, CAPILLARY
Glucose-Capillary: 150 mg/dL — ABNORMAL HIGH (ref 70–99)
Glucose-Capillary: 156 mg/dL — ABNORMAL HIGH (ref 70–99)
Glucose-Capillary: 110 mg/dL — ABNORMAL HIGH (ref 70–99)
Glucose-Capillary: 121 mg/dL — ABNORMAL HIGH (ref 70–99)

## 2021-08-23 LAB — CBC WITH DIFFERENTIAL/PLATELET
Abs Immature Granulocytes: 0.02 10*3/uL (ref 0.00–0.07)
Basophils Absolute: 0 10*3/uL (ref 0.0–0.1)
Basophils Relative: 0 %
Eosinophils Absolute: 0.1 10*3/uL (ref 0.0–0.5)
Eosinophils Relative: 1 %
HCT: 35.7 % — ABNORMAL LOW (ref 39.0–52.0)
Hemoglobin: 10.5 g/dL — ABNORMAL LOW (ref 13.0–17.0)
Immature Granulocytes: 0 %
Lymphocytes Relative: 14 %
Lymphs Abs: 1 10*3/uL (ref 0.7–4.0)
MCH: 31.7 pg (ref 26.0–34.0)
MCHC: 29.4 g/dL — ABNORMAL LOW (ref 30.0–36.0)
MCV: 107.9 fL — ABNORMAL HIGH (ref 80.0–100.0)
Monocytes Absolute: 0.7 10*3/uL (ref 0.1–1.0)
Monocytes Relative: 10 %
Neutro Abs: 5.1 10*3/uL (ref 1.7–7.7)
Neutrophils Relative %: 75 %
Platelets: 133 10*3/uL — ABNORMAL LOW (ref 150–400)
RBC: 3.31 MIL/uL — ABNORMAL LOW (ref 4.22–5.81)
RDW: 18 % — ABNORMAL HIGH (ref 11.5–15.5)
WBC: 6.9 10*3/uL (ref 4.0–10.5)
nRBC: 0 % (ref 0.0–0.2)

## 2021-08-23 SURGERY — INSERTION, PLEURAL DRAINAGE CATHETER
Anesthesia: LOCAL | Laterality: Bilateral

## 2021-08-23 MED ORDER — OXYCODONE HCL 5 MG PO TABS
2.5000 mg | ORAL_TABLET | Freq: Three times a day (TID) | ORAL | Status: DC | PRN
  Administered 2021-08-23: 2.5 mg via ORAL
  Filled 2021-08-23: qty 1

## 2021-08-23 MED ORDER — HEPARIN SODIUM (PORCINE) 1000 UNIT/ML DIALYSIS
2000.0000 [IU] | Freq: Once | INTRAMUSCULAR | Status: AC
  Administered 2021-08-23: 2000 [IU] via INTRAVENOUS_CENTRAL
  Filled 2021-08-23: qty 2

## 2021-08-23 NOTE — Progress Notes (Signed)
Spoke to [place]ard at [facility]. Clinic is aware of pt's planned d/c for tomorrow and to resume treatments on Friday. Clinic requests d/c summary which renal navigator will fax tomorrow once available.  Melven Sartorius Renal Navigator [phone]

## 2021-08-23 NOTE — Progress Notes (Addendum)
[facility] Progress Note   Dialysis Orders:  MWF [facility]  [place]    3.5 hrs     84kg    Heparin 2000  LUA AVF  Assessment/Plan: Status post revision of left below-knee amputation: Admitted to inpatient rehabilitation unit. B/L pleural effusion: s/p rt and lt thora, pulm following Hypertension/volume excess: -on midodrine 10 mg twice daily. +dependent edema which is significantly better.  Weighed in the [facility] today and new edw is 77.5kg. I notified his OP HD unit.  ESRD: on HD MWF. HD today Anemia of chronic disease: Hemoglobin 9's, resumed [name]200 ug weekly while here on Wed's. CKD-MBD: Calcium level acceptable with improving phosphorus levels on [place]. Nutrition: Ongoing renal diet with protein supplementation and renal multivitamin.  Fluid restriction. Asterixis - due to excessive narcotic dosing, has resolved w/ tapering down of these meds. Also Gabapentin dc'd.  Dispo - going home tomorrow  Kelly Splinter, MD 08/23/2021, 12:52 PM      Subjective: Seen in room. No new c/o's today.   Physical Exam General: nad Heart: RRR Lungs: cta bl Abdomen: soft non-tender  Extremities: L BKA, edema in UE and rt lower ext Neuro: alert this am Dialysis Access: LUE AVF +bruit    Objective Vitals:   08/22/21 1300 08/22/21 1943 08/23/21 0439 08/23/21 0500  BP: 133/76 130/76 134/79   Pulse: 96 96 89   Resp: 17 14 14    Temp: 99 F (37.2 C) 98.5 F (36.9 C) (!) 97.5 F (36.4 C)   TempSrc: Oral Oral Oral   SpO2: 97% 98% 98%   Weight:    99 kg  Height:         Additional Objective Labs: Basic Metabolic Panel: Recent Labs  Lab 08/18/21 1521  NA 132*  K 4.1  CL 97*  CO2 24  GLUCOSE 141*  BUN 62*  CREATININE 5.66*  CALCIUM 9.0  PHOS 1.5*    CBC: Recent Labs  Lab 08/18/21 1521 08/22/21 0520  WBC 9.1 6.3  HGB 10.1* 10.3*  HCT 33.2* 34.7*  MCV 106.8* 106.4*  PLT 161 138*    Blood Culture    Component Value Date/Time   SDES FLUID  PLEURAL LEFT 08/17/2021 1051   SPECREQUEST NONE 08/17/2021 1051   CULT  08/17/2021 1051    NO GROWTH 3 DAYS Performed at [facility], [place] [address], [place], [place] [address]    REPTSTATUS 08/21/2021 FINAL 08/17/2021 1051       Medications:   vitamin C  1,000 mg Oral Daily   aspirin EC  81 mg Oral Daily   atorvastatin  40 mg Oral Daily   cinacalcet  30 mg Oral Weekly   darbepoetin ([name]) injection - DIALYSIS  200 mcg Intravenous Q Wed-HD   docusate sodium  100 mg Oral TID   ferric citrate  420 mg Oral TID with meals   [START ON 08/24/2021] heparin  2,000 Units Dialysis Once in dialysis   insulin aspart  0-6 Units Subcutaneous TID WC   linaclotide  290 mcg Oral QAC breakfast   midodrine  10 mg Oral TID WC   multivitamin  1 tablet Oral QHS   nutrition supplement (JUVEN)  1 packet Oral BID BM   pantoprazole  40 mg Oral Daily   polyethylene glycol  17 g Oral BID   psyllium  1 packet Oral Daily   ramelteon  8 mg Oral QHS   senna-docusate  2 tablet Oral QHS   traZODone  50 mg Oral  QHS   venlafaxine XR  37.5 mg Oral QPM

## 2021-08-23 NOTE — Progress Notes (Signed)
Occupational Therapy Session Note  Patient Details  Name: Maxwell Harmon MRN: [id number] Date of Birth: 1955-12-12  Today's Date: 08/23/2021 OT Individual Time: 3073-5430 OT Individual Time Calculation (min): 73 min    Short Term Goals: Week 2:  OT Short Term Goal 1 (Week 2): Pt will perform LB dressing with MIN A OT Short Term Goal 1 - Progress (Week 2): Progressing toward goal OT Short Term Goal 2 (Week 2): Pt will complete MIN A squat pivot transfer to [facility] OT Short Term Goal 2 - Progress (Week 2): Progressing toward goal OT Short Term Goal 3 (Week 2): Pt will engage in self-care tasks for 5 minutes w/o requiring external O2 support OT Short Term Goal 3 - Progress (Week 2): Met OT Short Term Goal 4 (Week 2): Pt will complete sit > stand with min assist to decrease burden of care with LB dressing OT Short Term Goal 4 - Progress (Week 2): Discontinued (comment) (Discontinued due to LTG for sit > stand downgraded to mod A level)  Skilled Therapeutic Interventions/Progress Updates:  Skilled OT intervention completed with focus on discharge planning, discussion of rehab goals, ADL retraining and BUE strengthening. Pt received semi-supine in bed, agreeable to therapy session. Reported feeling tired due to not sleeping well, with pt opting to not complete full shower this session. Pt supervision semi-supine > seated EOB > squat pivot transfer to w/c with min A. While seated in w/c at sink, pt able to complete UB bathing/dressing with set up, LB bathing/dressing with min A with pt able w/c push up for cleaning buttocks/donning pants over bottom. Max A for socks/footwear. Pt with increased time needed for rest breaks due to fatigue, and needed cues to initiate next steps with pt nodding off at times. Pt able to doff dirty black shrinker and donn clean one on residual limb with mod A. Pt completed seated BUE strengthening exercises while seated in w/c with green theraband and cues required for form and  positioning including the following:   - shoulder flexion 2x10 each arm - horizontal shoulder abduction 2x10 - Chest presses 2x10  Pt left seated in w/c, with alarm belt activated and all needs in reach at end of session.   Therapy Documentation Precautions:  Precautions Precautions: Fall Precaution Comments: wound vac Required Braces or Orthoses: Other Brace Other Brace: limb guard Restrictions Weight Bearing Restrictions: Yes LLE Weight Bearing: Non weight bearing  Pain: Pt with c/o nausea upon quick positional change in bed during mobility however improved upon deep breaths and extended rest. Never produced any vomit.   Therapy/Group: Individual Therapy  Zackry Deines E Fletcher Ostermiller 08/23/2021, 7:40 AM

## 2021-08-23 NOTE — Progress Notes (Signed)
[employer] cancelled today since no one has been able to confirm if he has benefits to cover bottles after discharge. Attempts today to contact CM by our team have been unsuccessful. Can revisit his long-term plan in the coming days once we get this information.  Julian Hy, DO 08/23/21 1:04 PM  Pulmonary & Critical Care

## 2021-08-23 NOTE — Plan of Care (Signed)
Care plan completed/met 

## 2021-08-23 NOTE — Progress Notes (Signed)
PROGRESS NOTE   Subjective/Complaints: PleurX to be visited outpatient based on insurance coverage for bottles Still constipated- does not want additional stool softeners as does not want to have diarrhea during dialysis today  ROS: Denies CP, SOB, N/V/D, +constipation, +insomnia- sleeps well first half of night, poorly second half  Objective:   No results found. Recent Labs    08/22/21 0520  WBC 6.3  HGB 10.3*  HCT 34.7*  PLT 138*      No results for input(s): NA, K, CL, CO2, GLUCOSE, BUN, CREATININE, CALCIUM in the last 72 hours.    Intake/Output Summary (Last 24 hours) at 08/23/2021 1048 Last data filed at 08/23/2021 0800 Gross per 24 hour  Intake 680 ml  Output --  Net 680 ml          Physical Exam: Vital Signs Blood pressure 134/79, pulse 89, temperature (!) 97.5 F (36.4 C), temperature source Oral, resp. rate 14, height 5\' 11"  (1.803 m), weight 99 kg, SpO2 98 %. Gen: no distress, normal appearing HEENT: oral mucosa pink and moist, NCAT Cardio: Reg rate Chest: normal effort, normal rate of breathing Abd: soft, non-distended Ext: no edema Psych: pleasant, normal affect   Skin: Warm and dry.  Left BKA with sutures. Psych: Normal mood.  Normal behavior. Musc: Left BKA with tenderness, improving edema Neuro: Alert  Motor: B/l UE grossly 4+/5.  RLE: 3+/5 prox to 4+/5 distally LLE: 3/5 proximally and limited by pain.  Decreased sensation to PP and LT right foot.   Assessment/Plan: 1. Functional deficits which require 3+ hours per day of interdisciplinary therapy in a comprehensive inpatient rehab setting. Physiatrist is providing close team supervision and 24 hour management of active medical problems listed below. Physiatrist and rehab team continue to assess barriers to discharge/monitor patient progress toward functional and medical goals  Care Tool:  Bathing    Body parts bathed by  patient: Right arm, Left arm, Chest, Abdomen, Front perineal area, Right upper leg, Left upper leg, Face, Right lower leg   Body parts bathed by helper: Right lower leg, Buttocks Body parts n/a: Left lower leg   Bathing assist Assist Level: Minimal Assistance - Patient > 75%     Upper Body Dressing/Undressing Upper body dressing   What is the patient wearing?: Pull over shirt    Upper body assist Assist Level: Set up assist    Lower Body Dressing/Undressing Lower body dressing      What is the patient wearing?: Pants     Lower body assist Assist for lower body dressing: Minimal Assistance - Patient > 75%     Toileting Toileting    Toileting assist Assist for toileting: Moderate Assistance - Patient 50 - 74%     Transfers Chair/bed transfer  Transfers assist     Chair/bed transfer assist level: Contact Guard/Touching assist     Locomotion Ambulation   Ambulation assist   Ambulation activity did not occur: Safety/medical concerns (weakness, decreased balance, fear of falling)  Assist level: Moderate Assistance - Patient 50 - 74% Assistive device: Walker-rolling Max distance: 2'   Walk 10 feet activity   Assist  Walk 10 feet activity did not occur: Safety/medical  concerns (weakness, decreased balance, fear of falling)        Walk 50 feet activity   Assist Walk 50 feet with 2 turns activity did not occur: Safety/medical concerns (weakness, decreased balance, fear of falling)         Walk 150 feet activity   Assist Walk 150 feet activity did not occur: Safety/medical concerns (weakness, decreased balance, fear of falling)         Walk 10 feet on uneven surface  activity   Assist Walk 10 feet on uneven surfaces activity did not occur: Safety/medical concerns (weakness, decreased balance, fear of falling)         Wheelchair     Assist Is the patient using a wheelchair?: Yes Type of Wheelchair: Manual    Wheelchair assist level:  Supervision/Verbal cueing Max wheelchair distance: 73ft    Wheelchair 50 feet with 2 turns activity    Assist        Assist Level: Supervision/Verbal cueing   Wheelchair 150 feet activity     Assist      Assist Level: Maximal Assistance - Patient 25 - 49%   Blood pressure 134/79, pulse 89, temperature (!) 97.5 F (36.4 C), temperature source Oral, resp. rate 14, height 5\' 11"  (1.803 m), weight 99 kg, SpO2 98 %.  Medical Problem List and Plan: 1.  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  Impaired mobility: continue Heparin added             -antiplatelet therapy: Continue ASA, Plavix on hold per [facility] 3. Pain: d/c dilaudid. Decrease oxycodone to 2.5mg  q8H prn.              d/c'ed gabapentin given asterixis  Controlled with meds on 10/19 4. Anxiety: LCSW to follow for evaluation and support. D/c klonopin given respiratory status. Trying relaxation techniques.              -antipsychotic agents: N/A 5. Neuropsych: This patient is capable of making decisions on his own behalf. 6. Skin/Wound Care: wound vac d/ced, continue outpatient follow-up with ortho             --continue Vitamin C and Zinc with juven bid to promote wound healing.  7. Fluids/Electrolytes/Nutrition: Strict I/Os. Routine check of labs with HD. 8. T2DM: Monitor BS ac/hs and use SSI for elevated BS CBG (last 3)  Recent Labs    08/22/21 1655 08/22/21 2155 08/23/21 0617  GLUCAP 194* 169* 150*  Appeared to feel better off all diabetes medications- continue to hold for now.  9. ESRD: HD MWF at the end of the day to help with tolerance of therapy.              -Continue Sensipar and ferric citrate for metabolic bone disease.   -palliative care consulted as per family request Increased midodrine to 10mg  TID so he can tolerate more ultrafiltration 10. Anemia of chronic disease: Started on weekly  [name] 09/28.              --routine H/H check with HD.   Hemoglobin 10.1 on 10/14 11. Hypotension: Increase midodrine to 10mg  TID so he can tolerate more ultrafiltration  Controlled on 10/15 12. Depression with acute reaction?: Continue Effexor  -wife here for support  -team providing egosupport as well 13. CAD: Lipitor. Monitor for symptoms with increase in activity.   See #2 14.  PAD: Continue  Trental TID.  15.  Constipation: had BM with SMOG enema, added Linzess, increase colace to TID. Add psyllium   Bowel meds increased again on 10/15 16. Nausea: Improved at present.  17. Bilateral hand tremor: unknown cause- ammonia normal. Appreciate nephrology and medicine following. D/c Gabapentin. Decrease oxycodone to 2.5mg  q4H prn  ?Improving 18. Decreased appetite: continue megace- cleared with nephrology 19. Insomnia: Continue Rozarem and Trazodone.  -wife staying over helpful also 20. Lethargy: d/c klonopin. Improved. 21. Shortness of breath: improved after bilateral thoracotomy. Appreciate heart failure and critical care involvement  Plan for outpatient Pleurx catheter as insurance authorization needed. Discussed benefits of this procedure with patient.   LOS: 20 days A FACE TO FACE EVALUATION WAS PERFORMED  Clide Deutscher Oneita Allmon 08/23/2021, 10:48 AM

## 2021-08-23 NOTE — Progress Notes (Signed)
Recreational Therapy Session Note  Patient Details  Name: Maxwell Harmon MRN: [id number] Date of Birth: 1956-09-30 Today's Date: 08/23/2021  Pain: no c/o Pt participated in animal assisted activity seated w/c level with supervision petting the therapy dog and conversing with pet partners team.  Pt appreciative of this visit.   Kemah 08/23/2021, 2:59 PM

## 2021-08-23 NOTE — Progress Notes (Signed)
Patient ID: Maxwell Harmon, male   DOB: 1956/08/02, 65 y.o.   MRN: [id number] Team Conference Report to Patient/Family  Team Conference discussion was reviewed with the patient and caregiver, including goals, any changes in plan of care and target discharge date.  Patient and caregiver express understanding and are in agreement.  The patient has a target discharge date of 08/24/21.  SW called pt spouse Clinical research associate) provided team conference updates. Pt improving, experiencing some constipation, no nausea. On track for d/c on 10/20. HH establish with [facility]. SW will follow up wit [facility] if pt spouse request to change. No additional questions or concerns, sw will continue to follow up.   [name] 08/23/2021, 11:10 AM

## 2021-08-23 NOTE — Progress Notes (Signed)
Occupational Therapy Discharge Summary  Patient Details  Name: Maxwell Harmon MRN: [id number] Date of Birth: 09/01/1956   Patient has met 4 of 6 long term goals due to improved activity tolerance, improved balance, postural control, and ability to compensate for deficits.  Patient to discharge at overall Mod Assist level.  Patient's care partner is independent to provide the necessary physical assistance at discharge. Pt and family completed education and hands on training with good demonstrations with safety and body mechanics for SB transfers from w/c <> BSC and w/c <> TTB. Pt's wife is used to assisting pt with ADLs PTA with pt's CLOF, and feels confident they can complete things until ready to advance if possible with transfers upon home health therapies. Pt is set up for most ADLs at w/c level, however requires min A for LB bathing/dressing. CGA with SB transfers to all surface areas.   Reasons goals not met: Pt unable to meet dynamic standing balance at Max assist due to decreased balance and strength, however able to complete sit <> stand with min A during previous session. Unable to meet tub/transfer at supervision due to weakness in RLE for managing the lift over the tub threshold and at baseline required assistance for such from his wife.  Recommendation:  Patient will benefit from ongoing skilled OT services in home health setting to continue to advance functional skills in the area of BADL, iADL, and Reduce care partner burden.  Equipment: No equipment needed or recommended  Reasons for discharge: treatment goals met  Patient/family agrees with progress made and goals achieved: Yes  OT Discharge Precautions/Restrictions  Precautions Precautions: Fall Required Braces or Orthoses: Other Brace Other Brace: limb guard Restrictions Weight Bearing Restrictions: Yes LLE Weight Bearing: Non weight bearing Vital Signs Therapy Vitals Temp: (!) 97.5 F (36.4 C) Temp Source:  Oral Pulse Rate: 89 Resp: 14 BP: 134/79 Patient Position (if appropriate): Lying Oxygen Therapy SpO2: 98 % O2 Device: Room Air Pain Pt with no c/o pain ADL ADL Eating: Set up Where Assessed-Eating: Bed level Grooming: Setup Where Assessed-Grooming: Wheelchair, Sitting at sink Upper Body Bathing: Setup Where Assessed-Upper Body Bathing: Edge of bed Lower Body Bathing: Moderate assistance Where Assessed-Lower Body Bathing: Edge of bed Upper Body Dressing: Minimal assistance Where Assessed-Upper Body Dressing: Edge of bed Lower Body Dressing: Maximal assistance Where Assessed-Lower Body Dressing: Edge of bed Toileting: Not assessed Toilet Transfer: Minimal assistance Toilet Transfer Method: Sit pivot Tub/Shower Transfer: Minimal assistance Tub/Shower Transfer Method: Sit pivot Tub/Shower Equipment: Facilities manager: Not assessed ADL Comments:  (anxiety with transfers d/t weakess) Vision Baseline Vision/History: 1 Wears glasses Patient Visual Report: No change from baseline Vision Assessment?: No apparent visual deficits Perception  Perception: Within Functional Limits Praxis Praxis: Intact Cognition Overall Cognitive Status: Within Functional Limits for tasks assessed Arousal/Alertness: Awake/alert Orientation Level: Oriented X4 Memory: Appears intact Awareness: Appears intact Problem Solving: Appears intact Safety/Judgment: Appears intact Sensation Sensation Light Touch: Appears Intact Proprioception: Appears Intact Coordination Gross Motor Movements are Fluid and Coordinated: No Fine Motor Movements are Fluid and Coordinated: No Coordination and Movement Description: WFL, however increased time for motor planning Finger Nose Finger Test: increased time and decreased precision L<R Motor  Motor Motor: Within Functional Limits Motor - Skilled Clinical Observations: generalized weakness/deconditioning, occasional pain, decreased  balance/postural control Mobility  Bed Mobility Bed Mobility: Rolling Right;Rolling Left;Sit to Supine;Supine to Sit Rolling Right: Supervision/verbal cueing Rolling Left: Supervision/Verbal cueing Supine to Sit: Supervision/Verbal cueing Sit to Supine: Supervision/Verbal cueing  Transfers Sit to Stand: Moderate Assistance - Patient 50-74% Stand to Sit: Minimal Assistance - Patient > 75%  Trunk/Postural Assessment  Cervical Assessment Cervical Assessment: Exceptions to [facility] (forward head) Thoracic Assessment Thoracic Assessment: Exceptions to [facility] (kyphosis) Lumbar Assessment Lumbar Assessment: Exceptions to [facility] (posterior pelvic tilt) Postural Control Postural Control: Within Functional Limits  Balance Balance Balance Assessed: Yes Static Sitting Balance Static Sitting - Balance Support: Feet supported;Bilateral upper extremity supported Static Sitting - Level of Assistance: 5: Stand by assistance (supervision) Dynamic Sitting Balance Dynamic Sitting - Balance Support: Feet supported;No upper extremity supported Dynamic Sitting - Level of Assistance: 5: Stand by assistance (supervision) Static Standing Balance Static Standing - Balance Support: Bilateral upper extremity supported (RW) Static Standing - Level of Assistance: 5: Stand by assistance (CGA) Dynamic Standing Balance Dynamic Standing - Balance Support: Bilateral upper extremity supported (RW) Dynamic Standing - Level of Assistance: 4: Min assist Extremity/Trunk Assessment RUE Assessment RUE Assessment: Within Functional Limits General Strength Comments: 4/5 shoulder flex LUE Assessment LUE Assessment: Within Functional Limits General Strength Comments: 4/5 shoulder flex   Jaylynn Mcaleer E Kenneshia Rehm 08/23/2021, 7:47 AM

## 2021-08-23 NOTE — Progress Notes (Signed)
Physical Therapy Discharge Summary  Patient Details  Name: Maxwell Harmon MRN: [id number] Date of Birth: [birth year]  Today's Date: 08/23/2021 PT Individual Time: 1100-1155 PT Individual Time Calculation (min): 55 min   Patient has met 4 of 7 long term goals due to improved activity tolerance, improved balance, improved postural control, increased strength, increased range of motion, decreased pain, ability to compensate for deficits, and improved coordination. Patient to discharge at a wheelchair level Supervision. Patient's care partner is independent to provide the necessary physical assistance at discharge. Pt's wife and sister attended family education training on 10/18 and verbalized and demonstrated confidence with transfers (specifically car transfers) to ensure safe discharge home.  Reasons goals not met: Pt did not meet dynamic standing balance goal of CGA as pt continues to require min A due to decreased postural control due to altered balance strategies. Pt did not meet sit<>stand or car transfer goal of CGA as pt requires min/mod A to stand and min A for car transfers due to fatigue, fluctuations in mobility status, weakness/deconditioning, and decreased balance.   Recommendation:  Patient will benefit from ongoing skilled PT services in home health setting to continue to advance safe functional mobility, address ongoing impairments in transfers, generalized strengthening, dynamic standing balance/coordination, gait training, endurance, and to minimize fall risk.  Equipment: Transport chair; pt already has WC, RW, and slideboard  Reasons for discharge: treatment goals met and discharge from hospital  Patient/family agrees with progress made and goals achieved: Yes  Today's Interventions: Received pt sitting in WC, pt agreeable to PT treatment, and denied any pain during session but reported only getting a few hours of sleep night and feeling exhausted. Session with emphasis on  discharge planning, functional mobility/transfers, generalized strengthening, dynamic standing balance/coordination, and improved activity tolerance. Transport chair delivered to room - educated pt on how to fold chair to instruct his wife. Dialysis MD arrived and requested pt be weighed on scale. Therapist brought scale to room and rolled pt onto scale dependently - weight in WC 98.6kg. MD stated he will return after session to weigh WC and subtract difference to obtain pt's weight; informed RN. Donned R shoe and L limb guard with max A for time management purposes and pt performed WC mobility 149f using BUE mod I to 4W - decreased speed due to fatigue. Pt engaged with Pet Therapy dog, Dixie, to uplift mood/spirits and therapist provided pt with HEP and educated pt on frequency/duration/technique for the following exercises: -Supine Active Straight Leg Raise - 1 x daily - 7 x weekly - 2 sets - 10 reps -Supine Hip Abduction - 1 x daily - 7 x weekly - 2 sets - 10 reps -Sidelying Hip Abduction - 1 x daily - 7 x weekly - 2 sets - 10 reps -Supine Isometric Hip Adduction with Pillow at Knees - 1 x daily - 7 x weekly - 2 sets - 10 reps Pt transferred sit<>stand with RW and min/mod A x 3 trials and able to remain standing for 1 minute each time with CGA for balance. Attempted to stand x 1 rep without assist but pt unable to clear buttocks from [place] Pt [facility] and requested to return to bed. Pt transferred WC<>bed via lateral scoot to R with supervision and doffed R shoe and limb guard with total A. Sit<>supine with supervision and therapist inflated bed. Concluded session with pt semi-reclined in bed with all needs within reach.   PT Discharge Precautions/Restrictions  Precautions Precautions: Fall Required Braces or Orthoses: Other Brace Other Brace: limb guard Restrictions Weight Bearing Restrictions: Yes LLE Weight Bearing: Non weight bearing Pain Interference Pain  Interference Pain Effect on Sleep: 2. Occasionally Pain Interference with Therapy Activities: 2. Occasionally Pain Interference with Day-to-Day Activities: 2. Occasionally Cognition Overall Cognitive Status: Within Functional Limits for tasks assessed Arousal/Alertness: Awake/alert Orientation Level: Oriented X4 Memory: Appears intact Awareness: Appears intact Problem Solving: Appears intact Safety/Judgment: Appears intact Sensation Sensation Light Touch: Impaired by gross assessment Proprioception: Impaired by gross assessment Additional Comments: absent sensation along incision of L residual limb and along R medial malleoli and L great toe Coordination Gross Motor Movements are Fluid and Coordinated: No Fine Motor Movements are Fluid and Coordinated: No Coordination and Movement Description: generalized weakness/deconditioning, occasional pain, decreased balance/postural control Finger Nose Finger Test: increased time and decreased precision L<R Heel Shin Test: unable to perform on LLE due to BKA, WFL on RLE Motor  Motor Motor: Within Functional Limits Motor - Skilled Clinical Observations: generalized weakness/deconditioning, occasional pain, decreased balance/postural control  Mobility Bed Mobility Bed Mobility: Rolling Right;Rolling Left;Sit to Supine;Supine to Sit Rolling Right: Supervision/verbal cueing Rolling Left: Supervision/Verbal cueing Supine to Sit: Supervision/Verbal cueing Sit to Supine: Supervision/Verbal cueing Transfers Transfers: Sit to Stand;Stand to Sit;Lateral/Scoot Transfers Sit to Stand: Moderate Assistance - Patient 50-74% Stand to Sit: Minimal Assistance - Patient > 75% Lateral/Scoot Transfers: Supervision/Verbal cueing Transfer (Assistive device): Rolling walker Locomotion  Gait Ambulation: No Gait Gait: No Stairs / Additional Locomotion Stairs: No Pick up small object from the floor assist level: Dependent - Patient 0% Wheelchair  Mobility Wheelchair Mobility: Yes Wheelchair Assistance: Independent with Camera operator: Both upper extremities Wheelchair Parts Management: Supervision/cueing Distance: 130f  Trunk/Postural Assessment  Cervical Assessment Cervical Assessment: Exceptions to [facility](forward head) Thoracic Assessment Thoracic Assessment: Exceptions to [facility](kyphosis) Lumbar Assessment Lumbar Assessment: Exceptions to [facility](posterior pelvic tilt) Postural Control Postural Control: Within Functional Limits  Balance Balance Balance Assessed: Yes Static Sitting Balance Static Sitting - Balance Support: Feet supported;Bilateral upper extremity supported Static Sitting - Level of Assistance: 5: Stand by assistance (supervision) Dynamic Sitting Balance Dynamic Sitting - Balance Support: Feet supported;No upper extremity supported Dynamic Sitting - Level of Assistance: 5: Stand by assistance (supervision) Static Standing Balance Static Standing - Balance Support: Bilateral upper extremity supported (RW) Static Standing - Level of Assistance: 5: Stand by assistance (CGA) Dynamic Standing Balance Dynamic Standing - Balance Support: Bilateral upper extremity supported (RW) Dynamic Standing - Level of Assistance: 4: Min assist Extremity Assessment  RLE Assessment RLE Assessment: Exceptions to [facility] Strength Comments: grossly generalized to 4-/5 LLE Assessment LLE Assessment: Exceptions to [facility] Strength Comments: grossly generalized to 4-/5 (hip flexion/abd/add, and knee flexion/extension) - limited by pain  AAlfonse AlpersPT, DPT  08/23/2021, 7:39 AM

## 2021-08-24 LAB — GLUCOSE, CAPILLARY
Glucose-Capillary: 103 mg/dL — ABNORMAL HIGH (ref 70–99)
Glucose-Capillary: 159 mg/dL — ABNORMAL HIGH (ref 70–99)

## 2021-08-24 MED ORDER — PSYLLIUM 95 % PO PACK: 1.0000 | PACK | Freq: Every day | ORAL | 0 refills | Status: DC

## 2021-08-24 MED ORDER — CINACALCET HCL 30 MG PO TABS: 30.0000 mg | ORAL_TABLET | ORAL | 0 refills | Status: DC

## 2021-08-24 MED ORDER — AURYXIA 1 GM 210 MG(FE) PO TABS: 420.0000 mg | ORAL_TABLET | Freq: Three times a day (TID) | ORAL | Status: DC

## 2021-08-24 MED ORDER — METHOCARBAMOL 500 MG PO TABS: 500.0000 mg | ORAL_TABLET | Freq: Four times a day (QID) | ORAL | 0 refills | Status: DC | PRN

## 2021-08-24 MED ORDER — TRAZODONE HCL 150 MG PO TABS: 75.0000 mg | ORAL_TABLET | Freq: Every day | ORAL | 0 refills | Status: DC

## 2021-08-24 MED ORDER — PANTOPRAZOLE SODIUM 40 MG PO TBEC: 40.0000 mg | DELAYED_RELEASE_TABLET | Freq: Every day | ORAL | 0 refills | Status: DC

## 2021-08-24 MED ORDER — DOCUSATE SODIUM 100 MG PO CAPS: 100.0000 mg | ORAL_CAPSULE | Freq: Three times a day (TID) | ORAL | 0 refills | Status: DC

## 2021-08-24 MED ORDER — ATORVASTATIN CALCIUM 40 MG PO TABS: 40.0000 mg | ORAL_TABLET | Freq: Two times a day (BID) | ORAL | Status: DC

## 2021-08-24 MED ORDER — OXYCODONE HCL 5 MG PO TABS: 2.5000 mg | ORAL_TABLET | Freq: Two times a day (BID) | ORAL | Status: DC | PRN

## 2021-08-24 MED ORDER — LINACLOTIDE 290 MCG PO CAPS: 290.0000 ug | ORAL_CAPSULE | Freq: Every day | ORAL | 0 refills | Status: DC

## 2021-08-24 MED ORDER — RAMELTEON 8 MG PO TABS
8.0000 mg | ORAL_TABLET | Freq: Every day | ORAL | 0 refills | Status: DC
Start: 2021-08-24 — End: 2023-04-30

## 2021-08-24 MED ORDER — MIDODRINE HCL 10 MG PO TABS: 10.0000 mg | ORAL_TABLET | Freq: Three times a day (TID) | ORAL | 0 refills | Status: DC

## 2021-08-24 MED ORDER — CLOPIDOGREL BISULFATE 75 MG PO TABS
75.0000 mg | ORAL_TABLET | Freq: Every day | ORAL | 0 refills | Status: DC
Start: 2021-08-24 — End: 2023-04-30

## 2021-08-24 MED ORDER — POLYETHYLENE GLYCOL 3350 17 G PO PACK: 17.0000 g | PACK | Freq: Two times a day (BID) | ORAL | 0 refills | Status: DC

## 2021-08-24 MED ORDER — TRAZODONE HCL 50 MG PO TABS: 75.0000 mg | ORAL_TABLET | Freq: Every day | ORAL | Status: DC

## 2021-08-24 MED ORDER — SENNOSIDES-DOCUSATE SODIUM 8.6-50 MG PO TABS
2.0000 | ORAL_TABLET | Freq: Every day | ORAL | 0 refills | Status: DC
Start: 2021-08-24 — End: 2023-04-30

## 2021-08-24 MED ORDER — ASCORBIC ACID 1000 MG PO TABS: 1000.0000 mg | ORAL_TABLET | Freq: Every day | ORAL | 0 refills | Status: DC

## 2021-08-24 MED ORDER — OXYCODONE HCL 5 MG PO TABS: 2.5000 mg | ORAL_TABLET | Freq: Four times a day (QID) | ORAL | 0 refills | Status: DC | PRN

## 2021-08-24 NOTE — Progress Notes (Signed)
Inpatient Rehabilitation Medication Discharge Review by a Pharmacist  A complete drug regimen review was completed for this patient to identify any potential clinically significant medication issues.  High Risk Drug Classes Is patient taking? Indication by Medication  Antipsychotic Yes Compazine for nausea  Anticoagulant No SQ heparin stopped  Antibiotic No   Opioid Yes  Oxy, Dilaudid f/p BKA revision pain  Antiplatelet Yes ASA PAD, CAD  Hypoglycemics/insulin Yes SSI for DM  Vasoactive Medication Yes Midodrine for chronic hypotension (with HD)  Chemotherapy No   Other No      Type of Medication Issue Identified Description of Issue Recommendation(s)  Drug Interaction(s) (clinically significant)     Duplicate Therapy     Allergy     No Medication Administration End Date     Incorrect Dose      Additional Drug Therapy Needed     Significant med changes from prior encounter (inform family/care partners about these prior to discharge).    Other       Clinically significant medication issues were identified that warrant physician communication and completion of prescribed/recommended actions by midnight of the next day:  No  Name of provider notified for urgent issues identified: Raulkar MD, Algis Liming, PA  Provider Method of Notification: chat  Pharmacist comments:  Lasix no longer needed due to anuria Hold Seroquel due to lethargy Midodrine due to chronic hypotension Lipitor: dose decreased inpatient. Con't 40mg /d  Onnie Boer, PharmD, BCIDP, AAHIVP, CPP Infectious Disease Pharmacist 08/24/2021 9:58 AM

## 2021-08-24 NOTE — Progress Notes (Signed)
Patient discharged to home accompanied b his wife.

## 2021-08-24 NOTE — Progress Notes (Signed)
PROGRESS NOTE   Subjective/Complaints: Stable for d/c today Still no BM after suppository last night but feeling comfortable. Discussed enema, but he prefers to wait until he is home and more relaxed.   ROS: Denies CP, SOB, N/V/D, +cosntipation, +insomnia- sleeps well first half of night, poorly second half  Objective:   No results found. Recent Labs    08/22/21 0520 08/23/21 1247  WBC 6.3 6.9  HGB 10.3* 10.5*  HCT 34.7* 35.7*  PLT 138* 133*      Recent Labs    08/23/21 1247  NA 135  K 4.9  CL 100  CO2 28  GLUCOSE 154*  BUN 67*  CREATININE 6.46*  CALCIUM 8.8*      Intake/Output Summary (Last 24 hours) at 08/24/2021 0847 Last data filed at 08/24/2021 0600 Gross per 24 hour  Intake 480 ml  Output 2900 ml  Net -2420 ml          Physical Exam: Vital Signs Blood pressure 123/74, pulse 82, temperature 97.7 F (36.5 C), temperature source Oral, resp. rate 18, height 5\' 11"  (1.803 m), weight 102 kg, SpO2 100 %. Gen: no distress, normal appearing HEENT: oral mucosa pink and moist, NCAT Cardio: Reg rate Chest: normal effort, normal rate of breathing Abd: soft, non-distended Ext: no edema Psych: pleasant, normal affect Skin: Warm and dry.  Left BKA with sutures. Psych: Normal mood.  Normal behavior. Musc: Left BKA with tenderness, improving edema Neuro: Alert  Motor: B/l UE grossly 4+/5.  RLE: 3+/5 prox to 4+/5 distally LLE: 3/5 proximally and limited by pain.  Decreased sensation to PP and LT right foot.   Assessment/Plan: 1. Functional deficits which require 3+ hours per day of interdisciplinary therapy in a comprehensive inpatient rehab setting. Physiatrist is providing close team supervision and 24 hour management of active medical problems listed below. Physiatrist and rehab team continue to assess barriers to discharge/monitor patient progress toward functional and medical goals  Care  Tool:  Bathing    Body parts bathed by patient: Right arm, Left arm, Chest, Abdomen, Front perineal area, Right upper leg, Left upper leg, Face, Right lower leg   Body parts bathed by helper: Right lower leg, Buttocks Body parts n/a: Left lower leg   Bathing assist Assist Level: Minimal Assistance - Patient > 75%     Upper Body Dressing/Undressing Upper body dressing   What is the patient wearing?: Pull over shirt    Upper body assist Assist Level: Set up assist    Lower Body Dressing/Undressing Lower body dressing      What is the patient wearing?: Pants     Lower body assist Assist for lower body dressing: Minimal Assistance - Patient > 75%     Toileting Toileting    Toileting assist Assist for toileting: Moderate Assistance - Patient 50 - 74%     Transfers Chair/bed transfer  Transfers assist     Chair/bed transfer assist level: Supervision/Verbal cueing (lateral scoot)     Locomotion Ambulation   Ambulation assist   Ambulation activity did not occur: Safety/medical concerns (weakness/deconditioning, fatigue, decreased balance/postural control)  Assist level: Moderate Assistance - Patient 50 - 74% Assistive device: Walker-rolling Max  distance: 2'   Walk 10 feet activity   Assist  Walk 10 feet activity did not occur: Safety/medical concerns (weakness/deconditioning, fatigue, decreased balance/postural control)        Walk 50 feet activity   Assist Walk 50 feet with 2 turns activity did not occur: Safety/medical concerns (weakness/deconditioning, fatigue, decreased balance/postural control)         Walk 150 feet activity   Assist Walk 150 feet activity did not occur: Safety/medical concerns (weakness/deconditioning, fatigue, decreased balance/postural control)         Walk 10 feet on uneven surface  activity   Assist Walk 10 feet on uneven surfaces activity did not occur: Safety/medical concerns (weakness/deconditioning, fatigue,  decreased balance/postural control)         Wheelchair     Assist Is the patient using a wheelchair?: Yes Type of Wheelchair: Manual    Wheelchair assist level: Independent Max wheelchair distance: 166ft    Wheelchair 50 feet with 2 turns activity    Assist        Assist Level: Independent   Wheelchair 150 feet activity     Assist      Assist Level: Independent   Blood pressure 123/74, pulse 82, temperature 97.7 F (36.5 C), temperature source Oral, resp. rate 18, height 5\' 11"  (1.803 m), weight 102 kg, SpO2 100 %.  Medical Problem List and Plan: 1.  Functional and mobility deficits secondary to PAD and left BKA/BKA revision 07/27/21   D/c home today 2.  Impaired mobility: continue Heparin added             -antiplatelet therapy: Continue ASA, Plavix on hold per [facility] 3. Pain: d/c dilaudid. Decrease oxycodone to 2.5mg  q12H prn.              d/c'ed gabapentin given asterixis- resolved  Controlled with meds on 10/20 4. Anxiety: LCSW to follow for evaluation and support. D/c klonopin given respiratory status. Trying relaxation techniques.              -antipsychotic agents: N/A 5. Neuropsych: This patient is capable of making decisions on his own behalf. 6. Skin/Wound Care: wound vac d/ced, continue outpatient follow-up with ortho             --continue Vitamin C and Zinc with juven bid to promote wound healing.  7. Fluids/Electrolytes/Nutrition: Strict I/Os. Routine check of labs with HD. 8. T2DM: Monitor BS ac/hs and use SSI for elevated BS CBG (last 3)  Recent Labs    08/23/21 1857 08/23/21 2130 08/24/21 0648  GLUCAP 110* 121* 103*  Appeared to feel better off all diabetes medications- continue to hold for now.  9. ESRD: HD MWF at the end of the day to help with tolerance of therapy.              -Continue Sensipar and ferric citrate for metabolic bone disease.   -palliative care consulted as per family request Increased midodrine to 10mg  TID so he  can tolerate more ultrafiltration 10. Anemia of chronic disease: Started on weekly [name] 09/28.              --routine H/H check with HD.   Hemoglobin 10.1 on 10/14 11. Hypotension: Increase midodrine to 10mg  TID so he can tolerate more ultrafiltration  Controlled on 10/15 12. Depression with acute reaction?: Continue Effexor  -wife here for support  -team providing egosupport as well 13. CAD: Lipitor. Monitor for symptoms with increase in activity.   See #2 14.  PAD: Continue Trental TID.  15.  Constipation: had BM with SMOG enema, added Linzess, increase colace to TID. Add psyllium   Bowel meds increased again on 10/15 16. Nausea: Improved at present.  17. Bilateral hand tremor: unknown cause- ammonia normal. Appreciate nephrology and medicine following. D/c Gabapentin. Decrease oxycodone to 2.5mg  q4H prn  ?Improving 18. Decreased appetite: continue megace- cleared with nephrology 19. Insomnia: Continue Rozarem and Trazodone. Increase trazodone to 75mg   -wife staying over helpful also 20. Lethargy: d/c klonopin. Improved. 21. Shortness of breath: improved after bilateral thoracotomy. Appreciate heart failure and critical care involvement  Plan for outpatient Pleurx catheter as insurance authorization needed. Discussed benefits of this procedure with patient.    >30 minutes spent in discharge of patient including review of medications and follow-up appointments, physical examination, and in answering all patient's questions   LOS: 21 days A FACE TO [place] 08/24/2021, 8:47 AM

## 2021-08-24 NOTE — Progress Notes (Signed)
Inpatient Rehabilitation Care Coordinator Discharge Note   Patient Details  Name: Maxwell Harmon MRN: [id number] Date of Birth: 09/29/1956   Discharge location: Home  Length of Stay: 21 Days  Discharge activity level: min A  Home/community participation: spouse and sister providing care  Patient response HQ:UIQNVV Literacy - How often do you need to have someone help you when you read instructions, pamphlets, or other written material from your doctor or pharmacy?: Often  Patient response YX:AJLUNG Isolation - How often do you feel lonely or isolated from those around you?: Never  Services provided included: SW, Neuropsych, Pharmacy, TR, CM, SLP, RN, OT, PT, RD, MD  Financial Services:  Financial Services Utilized: [place] offered to/list presented to: Spouse  Follow-up services arranged:  [place]: [place]         Patient response to transportation need: Is the patient able to respond to transportation needs?: Yes In the past 12 months, has lack of transportation kept you from medical appointments or from getting medications?: No In the past 12 months, has lack of transportation kept you from meetings, work, or from getting things needed for daily living?: No    Comments (or additional information):  Patient/Family verbalized understanding of follow-up arrangements:  Yes  Individual responsible for coordination of the follow-up plan: Janace Hoard [phone]  Confirmed correct DME delivered: [name] 08/24/2021    [name]

## 2021-08-24 NOTE — Discharge Summary (Signed)
Physician Discharge Summary  Patient ID: Maxwell Harmon MRN: [id number] DOB/AGE: 11/09/1955 65 y.o.  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 Moderately large bilateral pleural effusions have progressed from the prior study. Progressive bibasilar atelectasis. Vascular congestion is present without definite edema. IMPRESSION: Progressive bilateral pleural effusions bibasilar atelectasis. Likely fluid overload. Electronically Signed   By: Franchot Gallo M.D.   On: 08/16/2021 12:55   DG CHEST PORT 1 VIEW  Result Date: 08/21/2021 CLINICAL DATA:  Encounter for pleural effusion. EXAM: PORTABLE CHEST - 1 VIEW COMPARISON:  08/18/2021 FINDINGS: Moderate bilateral pleural effusions. Consolidation/atelectasis in the lung bases. The perihilar interstitial opacities have slightly improved. Heart size difficult to assess due to adjacent opacities. No pneumothorax. Sternotomy wires. IMPRESSION: Persistent moderate pleural effusions, with improving perihilar infiltrates or edema. Electronically Signed   By: Lucrezia Europe M.D.   On: 08/21/2021 07:40   DG CHEST PORT 1 VIEW  Result Date: 08/18/2021 CLINICAL DATA:  Pleural effusion EXAM: PORTABLE CHEST 1 VIEW COMPARISON:  08/17/2021 FINDINGS: Unchanged right pleural effusion, with increased left pleural effusion. Associated  atelectasis. Unchanged cardiac and mediastinal contours, status post median sternotomy. No acute osseous abnormality. IMPRESSION: Increased left pleural effusion, with unchanged right pleural effusion. Electronically Signed   By: Merilyn Baba M.D.   On: 08/18/2021 13:35   DG Chest Port 1 View  Result Date: 08/17/2021 CLINICAL DATA:  Status left-sided post thoracentesis. EXAM: PORTABLE CHEST 1 VIEW COMPARISON:  Chest x-ray 08/16/2021 FINDINGS: Interval reduction in size of the left-sided pleural effusion. A small effusion persists. Moderate overlying atelectasis. No left-sided thoracentesis. Persistent right pleural effusion and right lower lobe airspace consolidation. IMPRESSION: Status post left-sided thoracentesis with decrease in size of left pleural effusion. No pneumothorax. Electronically Signed   By: Marijo Sanes M.D.   On: 08/17/2021 12:19   DG Chest Port 1 View  Result Date: 08/16/2021 CLINICAL DATA:  Status post right thoracentesis. EXAM: PORTABLE CHEST 1 VIEW COMPARISON:  Same day. FINDINGS: No pneumothorax is noted status post right thoracentesis. Right pleural effusion is smaller compared to prior exam. Stable left pleural effusion. IMPRESSION: No pneumothorax status post right-sided thoracentesis. Electronically Signed   By: Marijo Conception M.D.   On: 08/16/2021 13:34    Labs:  Basic Metabolic Panel: Recent Labs  Lab 08/18/21 1521 08/23/21 1247  NA 132* 135  K 4.1 4.9  CL 97* 100  CO2 24 28  GLUCOSE 141* 154*  BUN 62* 67*  CREATININE 5.66* 6.46*  CALCIUM 9.0 8.8*  PHOS 1.5* 2.0*    CBC: Recent Labs  Lab 08/18/21 1521 08/22/21 0520 08/23/21 1247  WBC 9.1 6.3 6.9  NEUTROABS  --   --  5.1  HGB 10.1* 10.3* 10.5*  HCT 33.2* 34.7* 35.7*  MCV 106.8* 106.4* 107.9*  PLT 161 138* 133*    CBG: Recent Labs  Lab 08/23/21 0617 08/23/21 1146 08/23/21  1857 08/23/21 2130 08/24/21 0648  GLUCAP 150* 156* 110* 121* 103*    Brief HPI:   Maxwell Harmon is a 65 y.o.  male with history of HTN, renal cancer, ESRD-PPD-now HD MWF, CAD s/p NSTEMI 04/2021, L-BKA 05/2021 with need for multiple revisions and admitted on 07/27/21 with wound dehiscence.  He underwent revision of wound by Dr. Sharol Given and postop had issues with hypotension, hypoxia with volume overload as well as depressive symptoms.  Urine output had decreased to less than 50 cc LR therefore Lasix was discontinued.  Wound VAC was kept in place to help with wound support.  Therapy was ongoing and patient was noted to be limited by weakness with balance deficits affecting functional status.  [name] was recommended due to functional decline.     Hospital Course: Maxwell Harmon was admitted to rehab 08/03/2021 for inpatient therapies to consist of PT and OT at least three hours five days a week. Past admission physiatrist, therapy team and rehab RN have worked together to provide customized collaborative inpatient rehab.  His  blood pressures were monitored on TID basis and hypotension continue to be a limiting factor.  Midodrine was increased to 10 mg 3 times daily with improvement with activity tolerance.  He had issues with tremors in bilateral upper extremities therefore gabapentin was discontinued patient was not felt to be uremic.  Klonopin was briefly briefly resumed but discontinued due to concerns of lethargy.    Sleep-wake disruption has resolved with multiple medication changes as well as support by family staying at nights.  His pain was initially a significant limiting factor and he has been educated on importance of desensitization techniques.  Incision is intact but noted to have areas of ischemia and Dr. Sharol Given was consulted for input.  He felt that wound with good consolidation and patient is to follow-up for wound check in a week.  Serial CBC has been monitored with hemodialysis and acute on chronic anemia has been managed with aranesp 200 mcg/weekly.  He had issues with endurance and reported SOB/DOE.  Chest  x-ray done showed evidence of recurrent pleural effusions and Dr. Elsworth Soho was consulted for input.  Patient underwent bedside thoracocentesis of 1.5 L of fluid of right effusion on 10/12 and 1.5 L of left effusion on 10/13.   Dr. Haroldine Laws was also consulted for input and recommended volume management per hemodialysis.  Patient's respiratory status has improved and palliative Pleurex l catheters were recommended for management of effusion.  Currently prior authorization from insurance is pending and patient to follow-up with pulmonary on outpatient basis.  Narcotics have been tapered to 2.5 mg QID with improvement in mentation. His bowel program has been titrated frequently to help manage OIC and patient/wife were educated on importance of adjusting bowel meds to help manage symptoms. His anxiety levels have improved with titration of trazodone as well as ego support that his been provided by rehab team as well as his family.  Palliative care was also consulted to help discuss GOC and he elected on full scope of care.  Nephrology has been following for management of hemodialysis and dependent edema is significantly better with improvement in BP.     His diabetes has been monitored with ac/hs CBG checks and SSI was use prn for tighter BS control.  He had extremely poor intake at admission therefore multiple nutritional supplements as well as vitamins were added to help with low calorie malnutrition and to promote wound healing.  His family has assisted/encourage  intake by providing food from home.  He had issues with hypoglycemia therefore I will diabetic medications were discontinued and conservative sliding scale was used during his stay.  As his mentation, pain and lethargy improved, his p.o. intake has started improving and blood sugars show occasional elevation.  Patient and wife were instructed on monitoring blood sugars on a daily basis and instructed on how conservative sliding scale for use.  He has made  gains during his rehab stay and currently requires min assist to supervision at wheelchair level.  He will continue to receive follow-up home health PT and OT by [facility] after discharge   Rehab course: During patient's stay in rehab weekly team conferences were held to monitor patient's progress, set goals and discuss barriers to discharge. At admission, patient required min assist with mobility and min to mod assist with basic self-care tasks. He has had improvement in activity tolerance, balance, postural control as well as ability to compensate for deficits.  He requires min assist for upper body care and mod assist for lower body care performed at edge of bed.  He requires min to mod assist for sit to stand transfers and supervision for lateral scoot transfers.  He requires min assist with standing balance.  His progress has been limited by fatigue as well as fluctuating activity tolerance due to weakness and deconditioning.  Family education was completed.     Discharge disposition: 01-Home or Self Care  Diet: Renal/diabetic restrictions. 1200 FR/day  Special Instructions: Monitor BS ac/hs and use SSI advised at discharge  Wash incision with dial soap and water, pat dry and apply compressive dressing.   Discharge Instructions     Ambulatory referral to Physical Medicine Rehab   Complete by: As directed    Post hospital follow up      Allergies as of 08/24/2021       Reactions   Ambien [zolpidem] Other (See Comments)   It causes pt to have insomnia, NOT sleep- idiosyncratic reaction        Medication List     STOP taking these medications    Basaglar KwikPen 100 UNIT/ML   CALCIUM PO   clonazePAM 0.5 MG tablet Commonly known as: KLONOPIN   furosemide 80 MG tablet Commonly known as: LASIX   gabapentin 100 MG capsule Commonly known as: NEURONTIN   insulin lispro 100 UNIT/ML KwikPen Commonly known as: HUMALOG   metaxalone 400 MG tablet Commonly known as:  SKELAXIN   QUEtiapine 50 MG tablet Commonly known as: SEROQUEL   zinc sulfate 220 (50 Zn) MG capsule       TAKE these medications    ascorbic acid 1000 MG tablet Commonly known as: VITAMIN C Take 1 tablet (1,000 mg total) by mouth daily.   aspirin EC 81 MG tablet Take 81 mg by mouth in the morning. Swallow whole.   atorvastatin 80 MG tablet Commonly known as: LIPITOR Take 0.5 tablets (40 mg total) by mouth daily. What changed: how much to take   Auryxia 1 GM 210 MG(Fe) tablet Generic drug: ferric citrate Take 2 tablets (420 mg total) by mouth 3 (three) times daily with meals. Take 1 tablet by mouth with each home meal, and if eating out take 2 tablet with that meal What changed:  how much to take when to take this   Bayer Contour Monitor w/Device Kit 1 each by Does not apply route 4 (four) times daily. Test 4 x daily   blood glucose meter kit  and supplies Kit Dispense based on patient and insurance preference. Use up to four times daily as directed. (FOR ICD-9 250.00, 250.01).   cinacalcet 30 MG tablet Commonly known as: SENSIPAR Take 1 tablet (30 mg total) by mouth once a week. Take on Mondays   clopidogrel 75 MG tablet Commonly known as: PLAVIX Take 1 tablet (75 mg total) by mouth daily. What changed: when to take this   docusate sodium 100 MG capsule Commonly known as: COLACE Take 1 capsule (100 mg total) by mouth 3 (three) times daily.   glucose blood test strip Commonly known as: Estate manager/land agent Use as instructed 4 x daily   Insulin Pen Needle 31G X 8 MM Misc Commonly known as: B-D ULTRAFINE III SHORT PEN 1 each by Does not apply route as directed.   Lancets Misc 1 each by Does not apply route 4 (four) times daily.   linaclotide 290 MCG Caps capsule Commonly known as: LINZESS Take 1 capsule (290 mcg total) by mouth daily before breakfast.   methocarbamol 500 MG tablet Commonly known as: ROBAXIN Take 1 tablet (500 mg total) by mouth every 6  (six) hours as needed for muscle spasms.   midodrine 10 MG tablet Commonly known as: PROAMATINE Take 1 tablet (10 mg total) by mouth 3 (three) times daily with meals. What changed:  medication strength how much to take when to take this   multivitamin Tabs tablet Take 1 tablet by mouth at bedtime.   nutrition supplement (JUVEN) Pack Take 1 packet by mouth 2 (two) times daily between meals.   oxyCODONE 5 MG immediate release tablet--Rx # 14 pills  Commonly known as: Oxy IR/ROXICODONE Take 0.5 tablets (2.5 mg total) by mouth every 6 (six) hours as needed for severe pain. Notes to patient: Limit to one pill per day   pantoprazole 40 MG tablet Commonly known as: PROTONIX Take 1 tablet (40 mg total) by mouth daily.   polyethylene glycol 17 g packet Commonly known as: MIRALAX / GLYCOLAX Take 17 g by mouth 2 (two) times daily. What changed:  when to take this reasons to take this   psyllium 95 % Pack Commonly known as: HYDROCIL/METAMUCIL Take 1 packet by mouth daily.   ramelteon 8 MG tablet Commonly known as: ROZEREM Take 1 tablet (8 mg total) by mouth at bedtime.   senna-docusate 8.6-50 MG tablet Commonly known as: Senokot-S Take 2 tablets by mouth at bedtime.   simethicone 80 MG chewable tablet Commonly known as: MYLICON Chew 1 tablet (80 mg total) by mouth 4 (four) times daily as needed for flatulence.   traZODone 150 MG tablet Commonly known as: DESYREL Take 0.5 tablets (75 mg total) by mouth at bedtime. What changed:  medication strength how much to take   venlafaxine XR 37.5 MG 24 hr capsule Commonly known as: EFFEXOR-XR Take 37.5 mg by mouth every evening.        Follow-up Information     Raulkar, Clide Deutscher, MD Follow up.   Specialty: Physical Medicine and Rehabilitation Why: 09/15/21 please arrive at 9:00am for 9:20am appointment, thank you! Contact information: [address] [place] [address] [phone]         Newt Minion, MD Follow up on 08/31/2021.   Specialty: Orthopedic Surgery Why: Be there at 1:30 pm for 1:45 appointment. Contact information: [place] [address] [phone]         [name], DO Follow up.   Specialty: Family  Medicine Contact information: [place] [address] [phone]         Rigoberto Noel, MD. Call.   Specialty: Pulmonary Disease Why: for follow up Contact information: [address] [place] [address] [phone]         Bensimhon, Shaune Pascal, MD. Call.   Specialty: Cardiology Why: for follow up Contact information: [address] [place] [address] [phone]                 Signed: Bary Leriche 08/28/2021, 4:05 PM

## 2021-08-27 ENCOUNTER — Other Ambulatory Visit: Payer: Self-pay | Admitting: Orthopedic Surgery

## 2021-08-28 ENCOUNTER — Telehealth: Payer: Self-pay

## 2021-08-28 NOTE — Telephone Encounter (Signed)
I called and lm on vm to advise that per Dr. Sharol Given will eval at office visit this Thursday at 1:45 to call with any questions.

## 2021-08-28 NOTE — Telephone Encounter (Signed)
[facility] wanted to let Dr. Sharol Given know that patient has a ulcer that is posterior, distal to his incision on his left BKA.  Stated that his patient's wife took a picture, if Dr. Sharol Given needs to see it. Left BKA surgery on 07/28/2021.   Cb# [phone].  Please advise.  Thank you.

## 2021-08-31 ENCOUNTER — Other Ambulatory Visit: Payer: Self-pay | Admitting: Physical Medicine and Rehabilitation

## 2021-08-31 ENCOUNTER — Ambulatory Visit (INDEPENDENT_AMBULATORY_CARE_PROVIDER_SITE_OTHER): Payer: Managed Care, Other (non HMO) | Admitting: Orthopedic Surgery

## 2021-08-31 DIAGNOSIS — T8781 Dehiscence of amputation stump: Secondary | ICD-10-CM

## 2021-09-12 ENCOUNTER — Encounter: Payer: Self-pay | Admitting: Orthopedic Surgery

## 2021-09-12 NOTE — Progress Notes (Signed)
Office Visit Note   Patient: Maxwell Harmon           Date of Birth: 12-12-1955           MRN: [id number] Visit Date: 08/31/2021              Requested by: [name], [place] [address] PCP: [name], DO  Chief Complaint  Patient presents with   Left Leg - Routine Post Op    Left revision BKA 08/24/21      HPI: Pilar Plate patient is a 65 year old gentleman who presents 4 weeks status post revision transtibial amputation secondary  Assessment & Plan: Visit Diagnoses:  1. Dehiscence of amputation stump (HCC)     Plan: Stitches were harvested.  Patient was given a large arm sleeve for his arm.  Patient was given a size large and extra-large under the liner sleeves.  Follow-Up Instructions: Return in about 2 weeks (around 09/14/2021).   Ortho Exam  Patient is alert, oriented, no adenopathy, well-dressed, normal affect, normal respiratory effort. Examination there is some mild ischemic changes and mild dermatitis but overall the wound is healing quite well.  Sutures are harvested today.  Imaging: No results found.    Labs: Lab Results  Component Value Date   HGBA1C 5.3 06/16/2021   HGBA1C 8.2 (H) 04/10/2021   HGBA1C 6.9 (H) 01/02/2019   REPTSTATUS 08/21/2021 FINAL 08/17/2021   GRAMSTAIN  08/17/2021    RARE WBC PRESENT, PREDOMINANTLY MONONUCLEAR NO ORGANISMS SEEN    CULT  08/17/2021    NO GROWTH 3 DAYS Performed at [facility], [place] [address] [place], [place] [address]    LABORGA KLEBSIELLA PNEUMONIAE (A) 06/16/2021     Lab Results  Component Value Date   ALBUMIN 2.8 (L) 08/23/2021   ALBUMIN 2.7 (L) 08/18/2021   ALBUMIN 2.7 (L) 08/11/2021    Lab Results  Component Value Date   MG 2.1 06/16/2021   MG 2.0 05/14/2021   MG 2.1 04/12/2021   No results found for: VD25OH  No results found for: PREALBUMIN CBC EXTENDED Latest Ref Rng & Units 08/23/2021 08/22/2021 08/18/2021  WBC 4.0 - 10.5 K/uL 6.9 6.3 9.1  RBC 4.22  - 5.81 MIL/uL 3.31(L) 3.26(L) 3.11(L)  HGB 13.0 - 17.0 g/dL 10.5(L) 10.3(L) 10.1(L)  HCT 39.0 - 52.0 % 35.7(L) 34.7(L) 33.2(L)  PLT 150 - 400 K/uL 133(L) 138(L) 161  NEUTROABS 1.7 - 7.7 K/uL 5.1 - -  LYMPHSABS 0.7 - 4.0 K/uL 1.0 - -     There is no height or weight on file to calculate BMI.  Orders:  No orders of the defined types were placed in this encounter.  No orders of the defined types were placed in this encounter.    Procedures: No procedures performed  Clinical Data: No additional findings.  ROS:  All other systems negative, except as noted in the HPI. Review of Systems  Objective: Vital Signs: There were no vitals taken for this visit.  Specialty Comments:  No specialty comments available.  PMFS History: Patient Active Problem List   Diagnosis Date Noted   Pleural effusion    S/P thoracentesis    Chronic insomnia    Hemodialysis-associated hypotension    ESRD on dialysis ([place])    S/P BKA (below knee amputation) unilateral, left ([place]) 08/03/2021   Wound dehiscence 07/07/2021   Hx of BKA, left ([place])    Dehiscence of amputation stump ([place])    Urinary retention 06/16/2021  Coronary artery disease involving native heart without angina pectoris 06/16/2021   Mixed diabetic hyperlipidemia associated with type 2 diabetes mellitus ([place]) 06/16/2021   GERD without esophagitis 06/16/2021   UTI (urinary tract infection) 06/16/2021   Pressure injury of skin 06/16/2021   Insomnia 06/15/2021   Therapeutic opioid-induced constipation (OIC) 16/08/9603   Complicated UTI (urinary tract infection) 06/15/2021   Below-knee amputation of left lower extremity ([place]) 05/18/2021   Acute on chronic systolic (congestive) heart failure (HCC)    Ischemic ulcer of left heel ([place])    Cellulitis 05/07/2021   Ischemic ulcer diabetic foot ([place]) 05/07/2021   Critical lower limb ischemia ([place]) 04/20/2021   Sepsis ([place]) 04/20/2021   NSTEMI (non-ST elevated myocardial infarction)  ([place]) 04/20/2021   Pressure ulcer, stage 2 ([place]) 04/11/2021   Acute heart failure ([place]) 04/10/2021   Uremia 04/10/2021   Gangrene of left foot (HCC)    PAD (peripheral artery disease) ([place])    End-stage renal disease on hemodialysis (HCC)    Dyspnea on exertion    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 ([place]) 09/02/2015   Hyperlipidemia 09/02/2015   Essential hypertension, benign 09/02/2015   Obesity due to excess calories 09/02/2015   Past Medical History:  Diagnosis Date   Anemia of chronic disease    Anxiety    CAD (coronary artery disease)    Depression    Diabetes mellitus without complication (HCC)    ESRD (end stage renal disease) on dialysis ([place])    MWF in [place]   History of bleeding ulcers    History of TIAs    Hypertension    PAD (peripheral artery disease) (HCC)    Renal cancer, right ([place])    s/p right radical nephrectomy 01/08/19    Family History  Problem Relation Age of Onset   Stroke Mother    Stroke Father    Cancer Father    Cancer Brother     Past Surgical History:  Procedure Laterality Date   ABDOMINAL AORTOGRAM W/LOWER EXTREMITY Bilateral 04/14/2021   Procedure: ABDOMINAL AORTOGRAM W/LOWER EXTREMITY;  Surgeon: Elam Dutch, MD;  Location: [facility];  Service: Vascular;  Laterality: Bilateral;   AMPUTATION Left 05/12/2021   Procedure: LEFT BELOW KNEE AMPUTATION;  Surgeon: Newt Minion, MD;  Location: [facility];  Service: Orthopedics;  Laterality: Left;   CATARACT EXTRACTION W/PHACO Left 02/15/2014   Procedure: CATARACT EXTRACTION PHACO AND INTRAOCULAR LENS PLACEMENT (IOC);  Surgeon: Tonny Branch, MD;  Location: [facility];  Service: Ophthalmology;  Laterality: Left;  CDE 10.84   CATARACT EXTRACTION W/PHACO Right 12/14/2013   Procedure: CATARACT EXTRACTION PHACO AND INTRAOCULAR LENS PLACEMENT (IOC);  Surgeon: Tonny Branch, MD;  Location: [facility];  Service:  Ophthalmology;  Laterality: Right;  CDE:  16.30   CHOLECYSTECTOMY  02/2013   EYE SURGERY     IR FLUORO GUIDE CV LINE RIGHT  01/05/2019   IR US GUIDE VASC ACCESS RIGHT  01/05/2019   LAPAROSCOPIC NEPHRECTOMY Right 01/08/2019   Procedure: LAPAROSCOPIC RADICAL NEPHRECTOMY;  Surgeon: Raynelle Bring, MD;  Location: [facility];  Service: Urology;  Laterality: Right;   LEFT HEART CATH AND CORS/GRAFTS ANGIOGRAPHY N/A 04/14/2021   Procedure: LEFT HEART CATH AND CORS/GRAFTS ANGIOGRAPHY;  Surgeon: Jolaine Artist, MD;  Location: [facility];  Service: Cardiovascular;  Laterality: N/A;   PENILE PROSTHESIS IMPLANT     PERIPHERAL VASCULAR INTERVENTION Left 04/19/2021   Procedure:  PERIPHERAL VASCULAR INTERVENTION;  Surgeon: Cherre Robins, MD;  Location: [facility];  Service: Cardiovascular;  Laterality: Left;   STUMP REVISION Left 07/07/2021   Procedure: REVISION LEFT BELOW KNEE AMPUTATION;  Surgeon: Newt Minion, MD;  Location: [facility];  Service: Orthopedics;  Laterality: Left;   STUMP REVISION Left 07/28/2021   Procedure: REVISION LEFT BELOW KNEE AMPUTATION;  Surgeon: Newt Minion, MD;  Location: [facility];  Service: Orthopedics;  Laterality: Left;   TOE AMPUTATION Right 2013   [facility]   Social History   Occupational History   Not on file  Tobacco Use   Smoking status: Never   Smokeless tobacco: Never  Vaping Use   Vaping Use: Never used  Substance and Sexual Activity   Alcohol use: Yes    Comment: social drink    Drug use: No   Sexual activity: Not Currently

## 2021-09-14 ENCOUNTER — Encounter: Payer: Managed Care, Other (non HMO) | Admitting: Orthopedic Surgery

## 2021-09-15 ENCOUNTER — Encounter: Payer: Managed Care, Other (non HMO) | Attending: Registered Nurse | Admitting: Physical Medicine and Rehabilitation

## 2021-09-16 ENCOUNTER — Other Ambulatory Visit: Payer: Self-pay | Admitting: Physical Medicine and Rehabilitation

## 2021-09-18 NOTE — Telephone Encounter (Signed)
Refill for Trazodone and Rozerom sent from pharmacy. Would you like to refill or send to PCP?

## 2021-09-19 ENCOUNTER — Encounter: Payer: Managed Care, Other (non HMO) | Admitting: Family

## 2021-09-20 ENCOUNTER — Other Ambulatory Visit: Payer: Self-pay | Admitting: Physical Medicine and Rehabilitation

## 2021-09-21 ENCOUNTER — Other Ambulatory Visit: Payer: Self-pay | Admitting: Physical Medicine and Rehabilitation

## 2021-09-25 ENCOUNTER — Ambulatory Visit (INDEPENDENT_AMBULATORY_CARE_PROVIDER_SITE_OTHER): Payer: Managed Care, Other (non HMO) | Admitting: Orthopedic Surgery

## 2021-09-25 ENCOUNTER — Other Ambulatory Visit: Payer: Self-pay

## 2021-09-25 DIAGNOSIS — Z89512 Acquired absence of left leg below knee: Secondary | ICD-10-CM

## 2021-09-25 DIAGNOSIS — T8781 Dehiscence of amputation stump: Secondary | ICD-10-CM

## 2021-09-25 DIAGNOSIS — S88112A Complete traumatic amputation at level between knee and ankle, left lower leg, initial encounter: Secondary | ICD-10-CM

## 2021-09-26 ENCOUNTER — Encounter: Payer: Managed Care, Other (non HMO) | Admitting: Orthopedic Surgery

## 2021-09-26 ENCOUNTER — Encounter: Payer: Self-pay | Admitting: Orthopedic Surgery

## 2021-09-26 NOTE — Progress Notes (Signed)
Office Visit Note   Patient: Maxwell Harmon           Date of Birth: [birth year]           MRN: [id number] Visit Date: 09/25/2021              Requested by: [name], [place] [address] PCP: [name], DO  Chief Complaint  Patient presents with   Left Leg - Routine Post Op    07/28/2021 revision left BKA       HPI: Patient is a 65 year old gentleman who presents almost 2 months status post left revision transtibial amputation.  Patient states he was in the hospital last week for recurrent fluid on the lungs.  Patient has developed a new ischemic ulcer over the distal lateral aspect of the residual limb.  Patient's wife states that they doubled up on the doxycycline and is taking 2 twice a day.  Assessment & Plan: Visit Diagnoses:  1. Dehiscence of amputation stump (HCC)   2. Below-knee amputation of left lower extremity (HCC)     Plan: Recommended taking the doxycycline 1 twice a day.  They were given a prescription for obtaining the prosthesis in [place].  Also recommended restarting his home health physical therapy.  Follow-Up Instructions: Return in about 2 weeks (around 10/09/2021).   Ortho Exam  Patient is alert, oriented, no adenopathy, well-dressed, normal affect, normal respiratory effort. Examination patient has developed an end bearing pressure ulcer about 1 x 2 cm with ischemic black eschar secondary to pressure from resting on the residual limb.  Discussed the importance of floating the leg and not allowing the distal aspect of the residual limb to rest on the chair.  Continue with the compression stocking.  Imaging: No results found. No images are attached to the encounter.  Labs: Lab Results  Component Value Date   HGBA1C 5.3 06/16/2021   HGBA1C 8.2 (H) 04/10/2021   HGBA1C 6.9 (H) 01/02/2019   REPTSTATUS 08/21/2021 FINAL 08/17/2021   GRAMSTAIN  08/17/2021    RARE WBC PRESENT, PREDOMINANTLY MONONUCLEAR NO ORGANISMS  SEEN    CULT  08/17/2021    NO GROWTH 3 DAYS Performed at [facility], [place] [address] [place], [place] [address]    LABORGA KLEBSIELLA PNEUMONIAE (A) 06/16/2021     Lab Results  Component Value Date   ALBUMIN 2.8 (L) 08/23/2021   ALBUMIN 2.7 (L) 08/18/2021   ALBUMIN 2.7 (L) 08/11/2021    Lab Results  Component Value Date   MG 2.1 06/16/2021   MG 2.0 05/14/2021   MG 2.1 04/12/2021   No results found for: VD25OH  No results found for: PREALBUMIN CBC EXTENDED Latest Ref Rng & Units 08/23/2021 08/22/2021 08/18/2021  WBC 4.0 - 10.5 K/uL 6.9 6.3 9.1  RBC 4.22 - 5.81 MIL/uL 3.31(L) 3.26(L) 3.11(L)  HGB 13.0 - 17.0 g/dL 10.5(L) 10.3(L) 10.1(L)  HCT 39.0 - 52.0 % 35.7(L) 34.7(L) 33.2(L)  PLT 150 - 400 K/uL 133(L) 138(L) 161  NEUTROABS 1.7 - 7.7 K/uL 5.1 - -  LYMPHSABS 0.7 - 4.0 K/uL 1.0 - -     There is no height or weight on file to calculate BMI.  Orders:  No orders of the defined types were placed in this encounter.  No orders of the defined types were placed in this encounter.    Procedures: No procedures performed  Clinical Data: No additional findings.  ROS:  All other systems negative, except as noted  in the HPI. Review of Systems  Objective: Vital Signs: There were no vitals taken for this visit.  Specialty Comments:  No specialty comments available.  PMFS History: Patient Active Problem List   Diagnosis Date Noted   Pleural effusion    S/P thoracentesis    Chronic insomnia    Hemodialysis-associated hypotension    ESRD on dialysis ([place])    S/P BKA (below knee amputation) unilateral, left ([place]) 08/03/2021   Wound dehiscence 07/07/2021   Hx of BKA, left (HCC)    Dehiscence of amputation stump (HCC)    Urinary retention 06/16/2021   Coronary artery disease involving native heart without angina pectoris 06/16/2021   Mixed diabetic hyperlipidemia associated with type 2 diabetes mellitus ([place]) 06/16/2021   GERD without esophagitis  06/16/2021   UTI (urinary tract infection) 06/16/2021   Pressure injury of skin 06/16/2021   Insomnia 06/15/2021   Therapeutic opioid-induced constipation (OIC) 34/74/2595   Complicated UTI (urinary tract infection) 06/15/2021   Below-knee amputation of left lower extremity ([place]) 05/18/2021   Acute on chronic systolic (congestive) heart failure (HCC)    Ischemic ulcer of left heel ([place])    Cellulitis 05/07/2021   Ischemic ulcer diabetic foot ([place]) 05/07/2021   Critical lower limb ischemia ([place]) 04/20/2021   Sepsis ([place]) 04/20/2021   NSTEMI (non-ST elevated myocardial infarction) ([place]) 04/20/2021   Pressure ulcer, stage 2 ([place]) 04/11/2021   Acute heart failure ([place]) 04/10/2021   Uremia 04/10/2021   Gangrene of left foot (HCC)    PAD (peripheral artery disease) ([place])    End-stage renal disease on hemodialysis ([place])    Dyspnea on exertion    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 ([place]) 09/02/2015   Hyperlipidemia 09/02/2015   Essential hypertension, benign 09/02/2015   Obesity due to excess calories 09/02/2015   Past Medical History:  Diagnosis Date   Anemia of chronic disease    Anxiety    CAD (coronary artery disease)    Depression    Diabetes mellitus without complication (HCC)    ESRD (end stage renal disease) on dialysis ([place])    MWF in [place]   History of bleeding ulcers    History of TIAs    Hypertension    PAD (peripheral artery disease) (HCC)    Renal cancer, right ([place])    s/p right radical nephrectomy 01/08/19    Family History  Problem Relation Age of Onset   Stroke Mother    Stroke Father    Cancer Father    Cancer Brother     Past Surgical History:  Procedure Laterality Date   ABDOMINAL AORTOGRAM W/LOWER EXTREMITY Bilateral 04/14/2021   Procedure: ABDOMINAL AORTOGRAM W/LOWER EXTREMITY;  Surgeon: Elam Dutch, MD;  Location: [facility];  Service: Vascular;   Laterality: Bilateral;   AMPUTATION Left 05/12/2021   Procedure: LEFT BELOW KNEE AMPUTATION;  Surgeon: Newt Minion, MD;  Location: [facility];  Service: Orthopedics;  Laterality: Left;   CATARACT EXTRACTION W/PHACO Left 02/15/2014   Procedure: CATARACT EXTRACTION PHACO AND INTRAOCULAR LENS PLACEMENT (IOC);  Surgeon: Tonny Branch, MD;  [facility];  Service: Ophthalmology;  Laterality: Left;  CDE 10.84   CATARACT EXTRACTION W/PHACO Right 12/14/2013   Procedure: CATARACT EXTRACTION PHACO AND INTRAOCULAR LENS PLACEMENT (IOC);  Surgeon: Tonny Branch, MD;  Location: [facility];  Service: Ophthalmology;  Laterality: Right;  CDE:  16.30   CHOLECYSTECTOMY  02/2013   EYE  SURGERY     IR FLUORO GUIDE CV LINE RIGHT  01/05/2019   IR US GUIDE VASC ACCESS RIGHT  01/05/2019   LAPAROSCOPIC NEPHRECTOMY Right 01/08/2019   Procedure: LAPAROSCOPIC RADICAL NEPHRECTOMY;  Surgeon: Raynelle Bring, MD;  [facility];  Service: Urology;  Laterality: Right;   LEFT HEART CATH AND CORS/GRAFTS ANGIOGRAPHY N/A 04/14/2021   Procedure: LEFT HEART CATH AND CORS/GRAFTS ANGIOGRAPHY;  Surgeon: Jolaine Artist, MD;  Location: [facility];  Service: Cardiovascular;  Laterality: N/A;   PENILE PROSTHESIS IMPLANT     PERIPHERAL VASCULAR INTERVENTION Left 04/19/2021   Procedure: PERIPHERAL VASCULAR INTERVENTION;  Surgeon: Cherre Robins, MD;  Location: [facility];  Service: Cardiovascular;  Laterality: Left;   STUMP REVISION Left 07/07/2021   Procedure: REVISION LEFT BELOW KNEE AMPUTATION;  Surgeon: Newt Minion, MD;  Location: [facility];  Service: Orthopedics;  Laterality: Left;   STUMP REVISION Left 07/28/2021   Procedure: REVISION LEFT BELOW KNEE AMPUTATION;  Surgeon: Newt Minion, MD;  Location: [facility];  Service: Orthopedics;  Laterality: Left;   TOE AMPUTATION Right 2013   [facility]   Social History   Occupational History   Not on file  Tobacco Use   Smoking status: Never   Smokeless tobacco: Never   Vaping Use   Vaping Use: Never used  Substance and Sexual Activity   Alcohol use: Yes    Comment: social drink    Drug use: No   Sexual activity: Not Currently

## 2021-10-02 ENCOUNTER — Telehealth: Payer: Self-pay

## 2021-10-02 NOTE — Telephone Encounter (Signed)
I called and sw pt's wife to advise that I needed their physical address in order to set up home health physical therapy.. She advised that the pt's PCP hd already scheduled this but just incase she had not heard back in the next could of days she would call me to also try and set up. The address is [employer] [employer] in [place] and they would like to use [facility] that number is [phone] documented in chart incase of need in the future.

## 2021-10-03 ENCOUNTER — Telehealth: Payer: Self-pay | Admitting: Orthopedic Surgery

## 2021-10-03 ENCOUNTER — Other Ambulatory Visit: Payer: Self-pay

## 2021-10-03 NOTE — Telephone Encounter (Signed)
Patient's wife called. She would like a RX for home health sent in. Would like Aut[facility] to call her. [phone]

## 2021-10-04 NOTE — Telephone Encounter (Signed)
Order faxed to [facility] for home health PT [phone] called pt's wife to advise that this has been done and to call if there are any questions.

## 2021-10-13 ENCOUNTER — Other Ambulatory Visit: Payer: Self-pay | Admitting: Physical Medicine and Rehabilitation

## 2021-10-24 ENCOUNTER — Ambulatory Visit: Payer: Managed Care, Other (non HMO) | Admitting: Orthopedic Surgery

## 2021-11-05 DEATH — deceased

## 2021-12-25 ENCOUNTER — Other Ambulatory Visit: Payer: Self-pay | Admitting: Physical Medicine and Rehabilitation

## 2022-09-01 IMAGING — CR DG TIBIA/FIBULA 2V*L*
4 series · 4 of 4 positions shown · non-contrast
Comparison: None.

CLINICAL DATA: Left lower extremity nonhealing wound

EXAM:
LEFT TIBIA AND FIBULA - 2 VIEW

[tibia ap (1 of 2)]
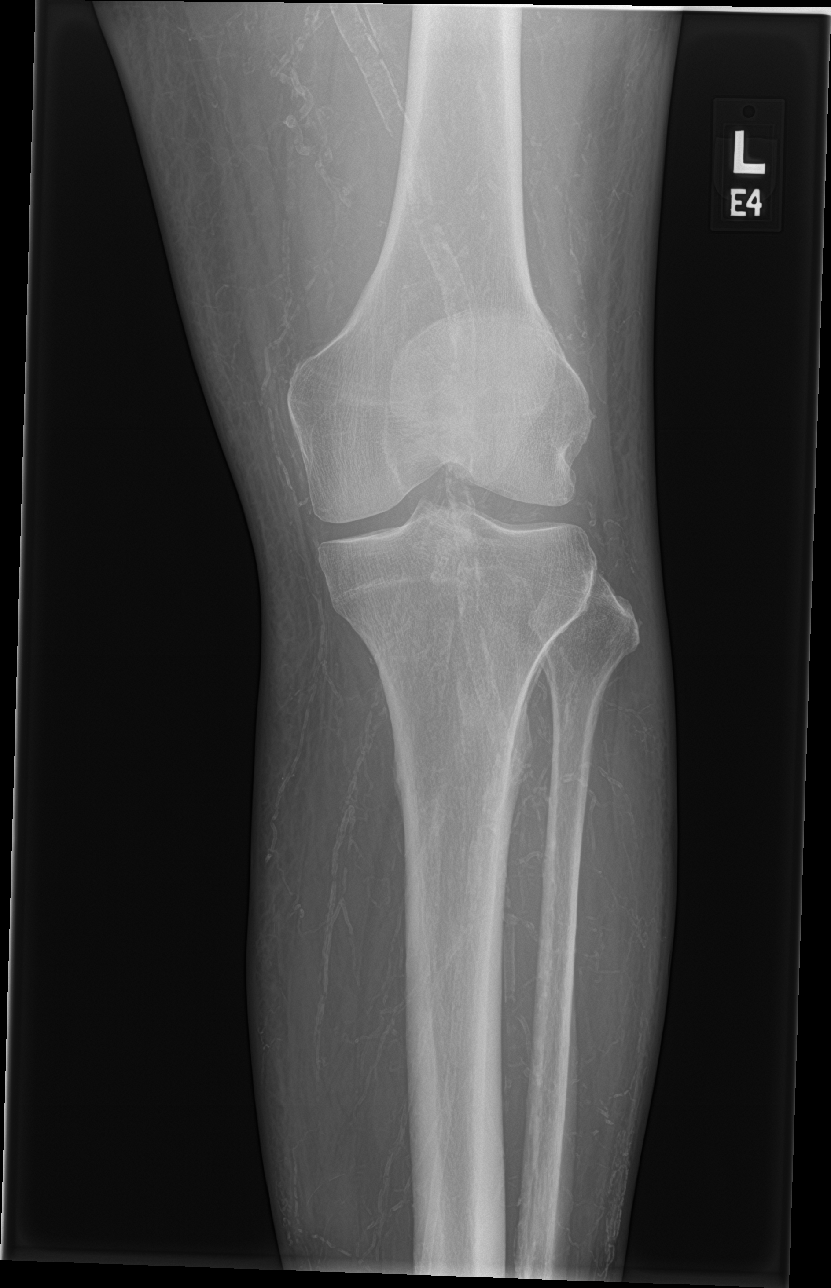

[tibia ap (2 of 2)]
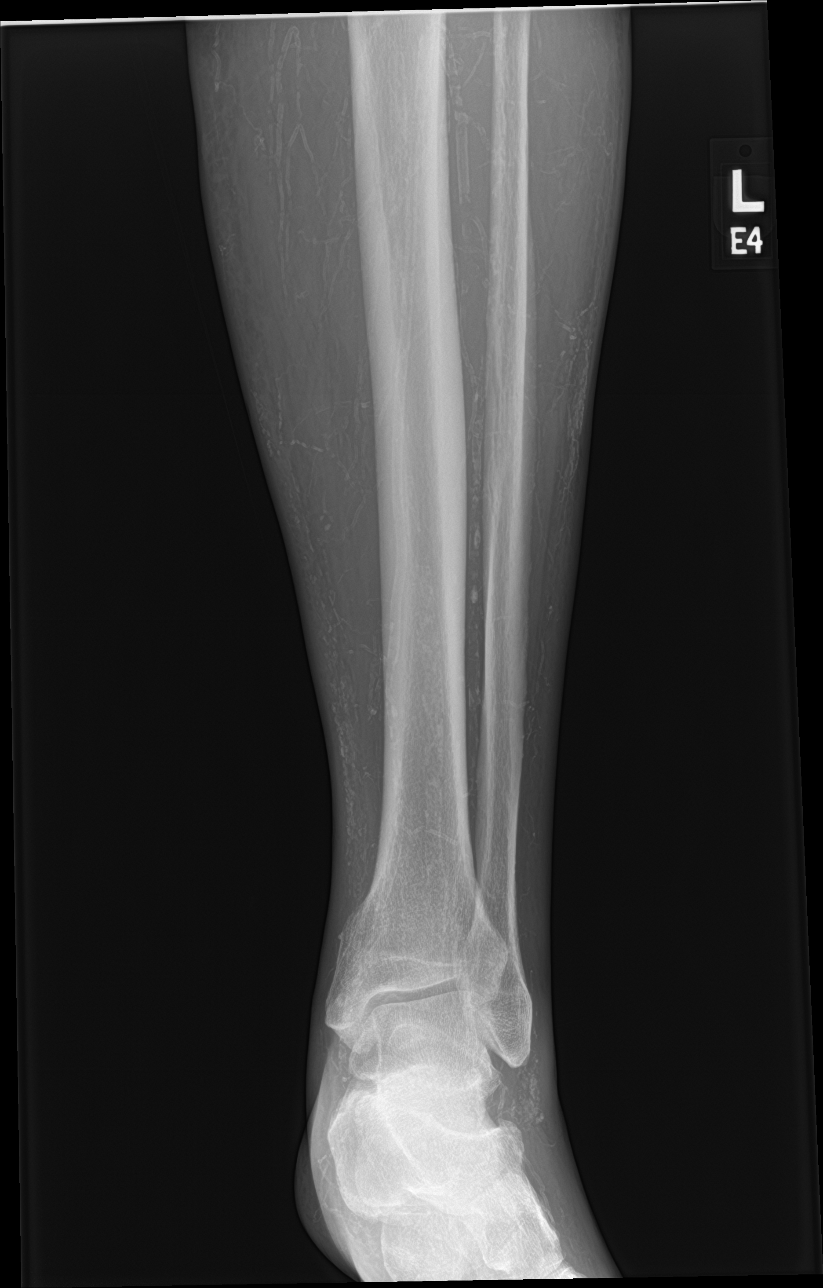

[tibia lat (1 of 2)]
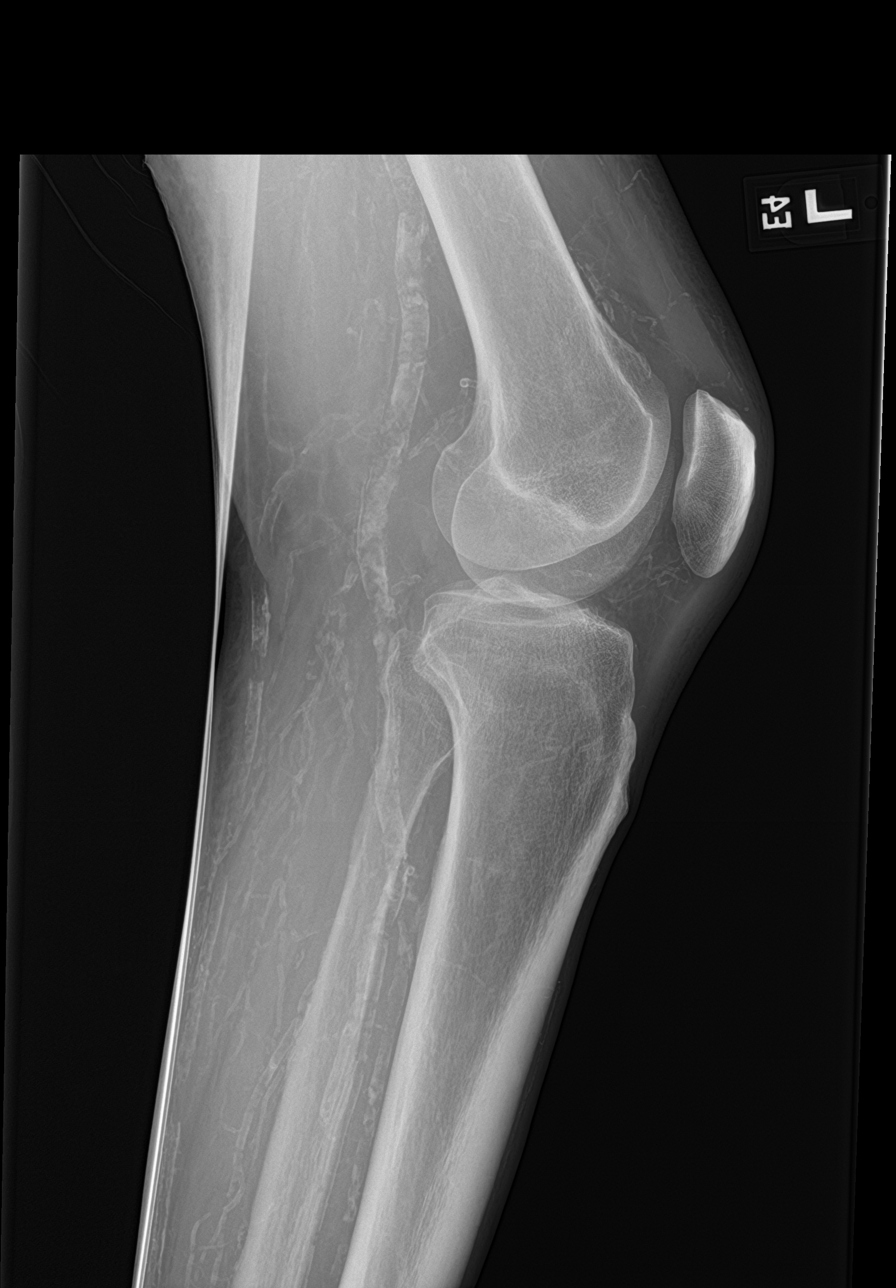

[tibia lat (2 of 2)]
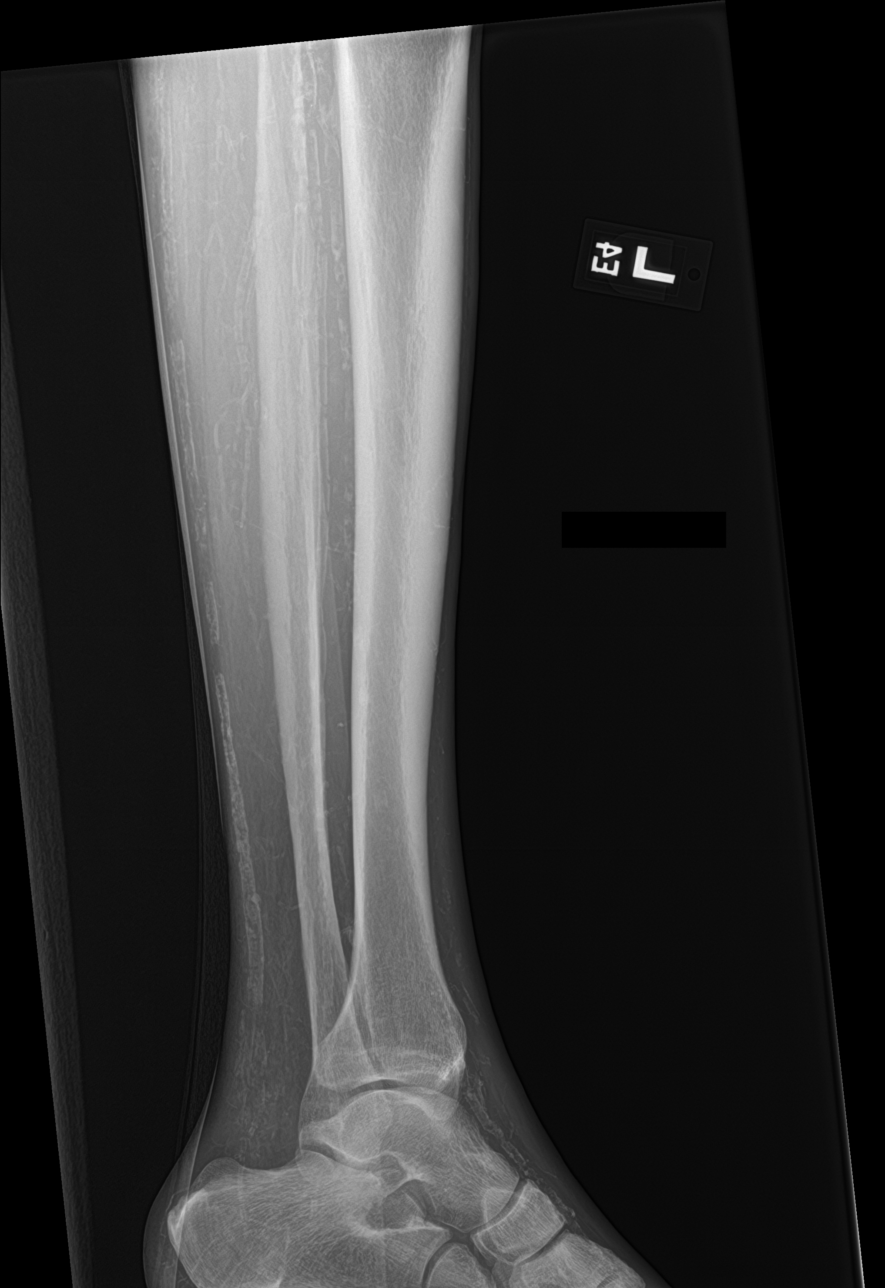

[4 of 4 positions shown; findings below may reference images not displayed]

FINDINGS: Three view radiograph left knee demonstrates normal alignment. No
fracture or dislocation. No osseous erosion. No effusion. Advanced
vascular calcifications are seen throughout the left lower
extremity.
IMPRESSION: Peripheral vascular disease.  No osseous erosion.

## 2022-09-01 IMAGING — CR DG FOOT 2V*L*
2 series · 2 of 2 positions shown · non-contrast
Comparison: None.

CLINICAL DATA: Diabetic foot wound

EXAM:
LEFT FOOT - 2 VIEW

[foot ap]
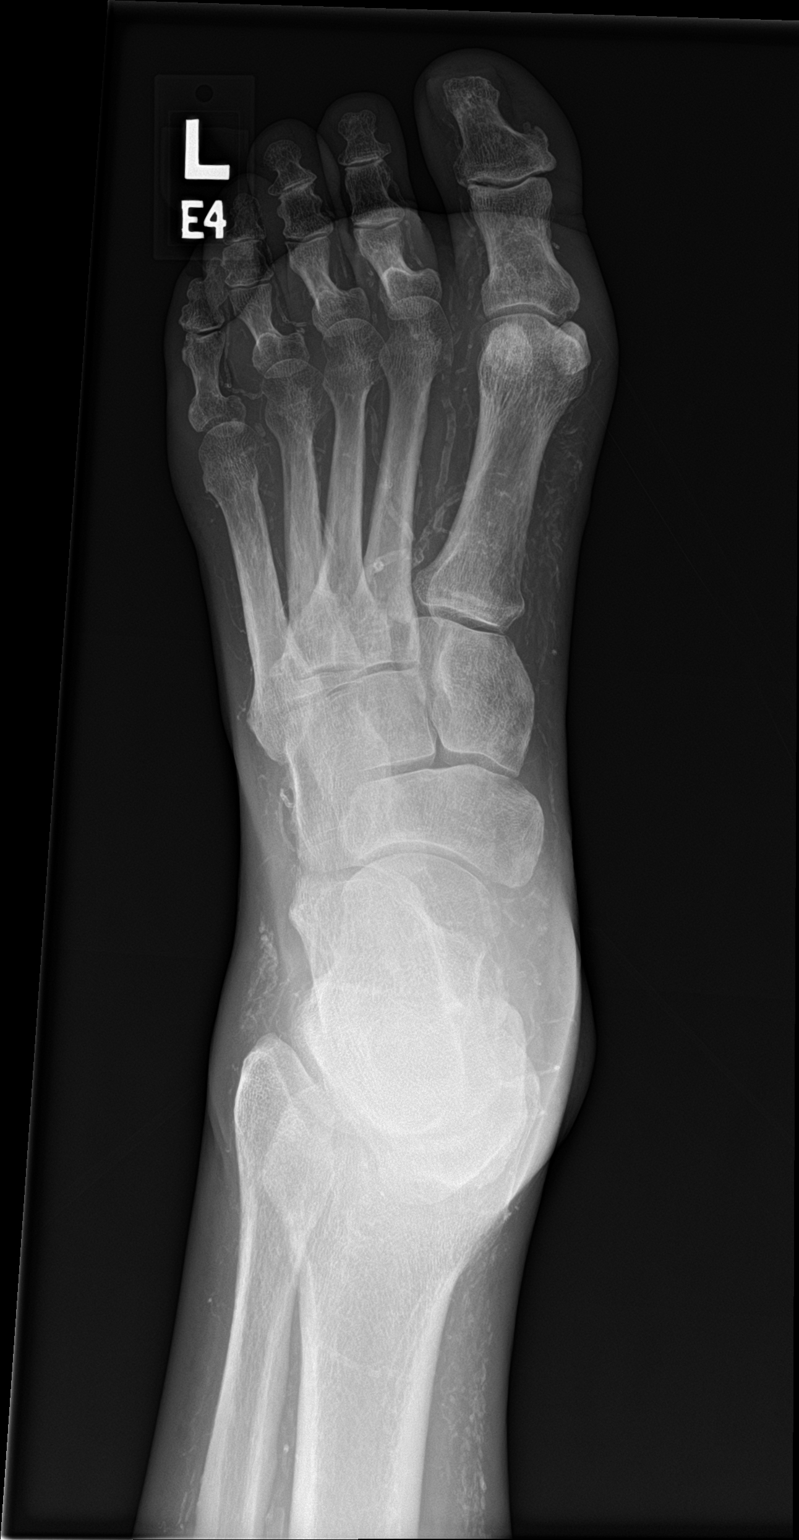

[foot lat]
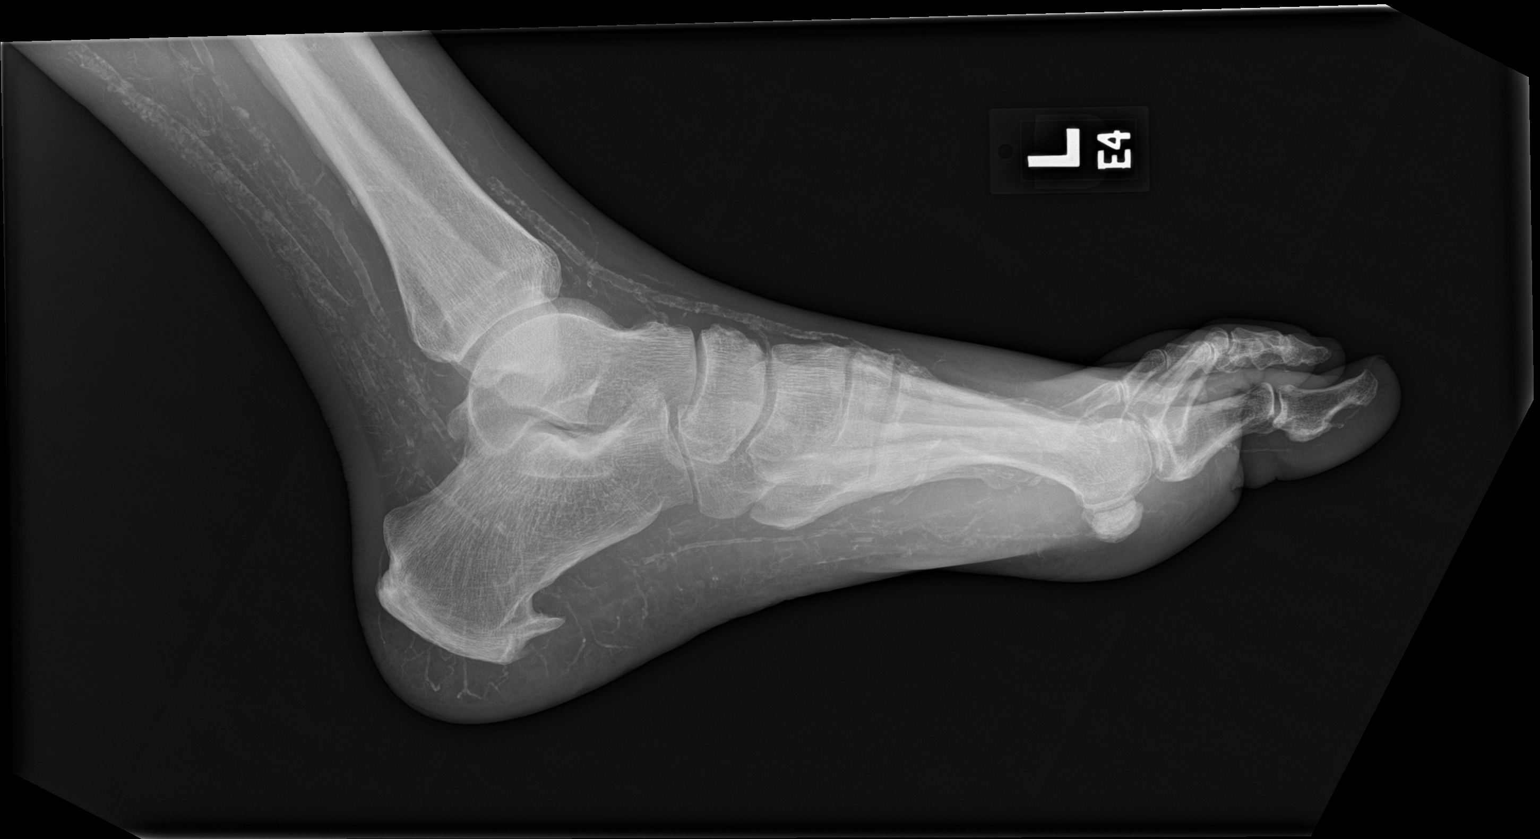

[2 of 2 positions shown; findings below may reference images not displayed]

FINDINGS: Two view radiograph left foot demonstrates normal alignment. No
fracture or dislocation. No osseous erosion. Advanced vascular
calcifications are seen throughout the left foot. Moderate plantar
calcaneal spur. Joint spaces are preserved.
IMPRESSION: Peripheral vascular disease with advanced vascular calcifications
throughout the left foot. No osseous erosion.

## 2022-10-05 IMAGING — DX DG CHEST 1V PORT
1 series · 1 of 1 positions shown · non-contrast
Comparison: Chest radiograph dated 04/10/2021.

CLINICAL DATA: 65-year-old male with cough.

EXAM:
PORTABLE CHEST 1 VIEW

[chest]
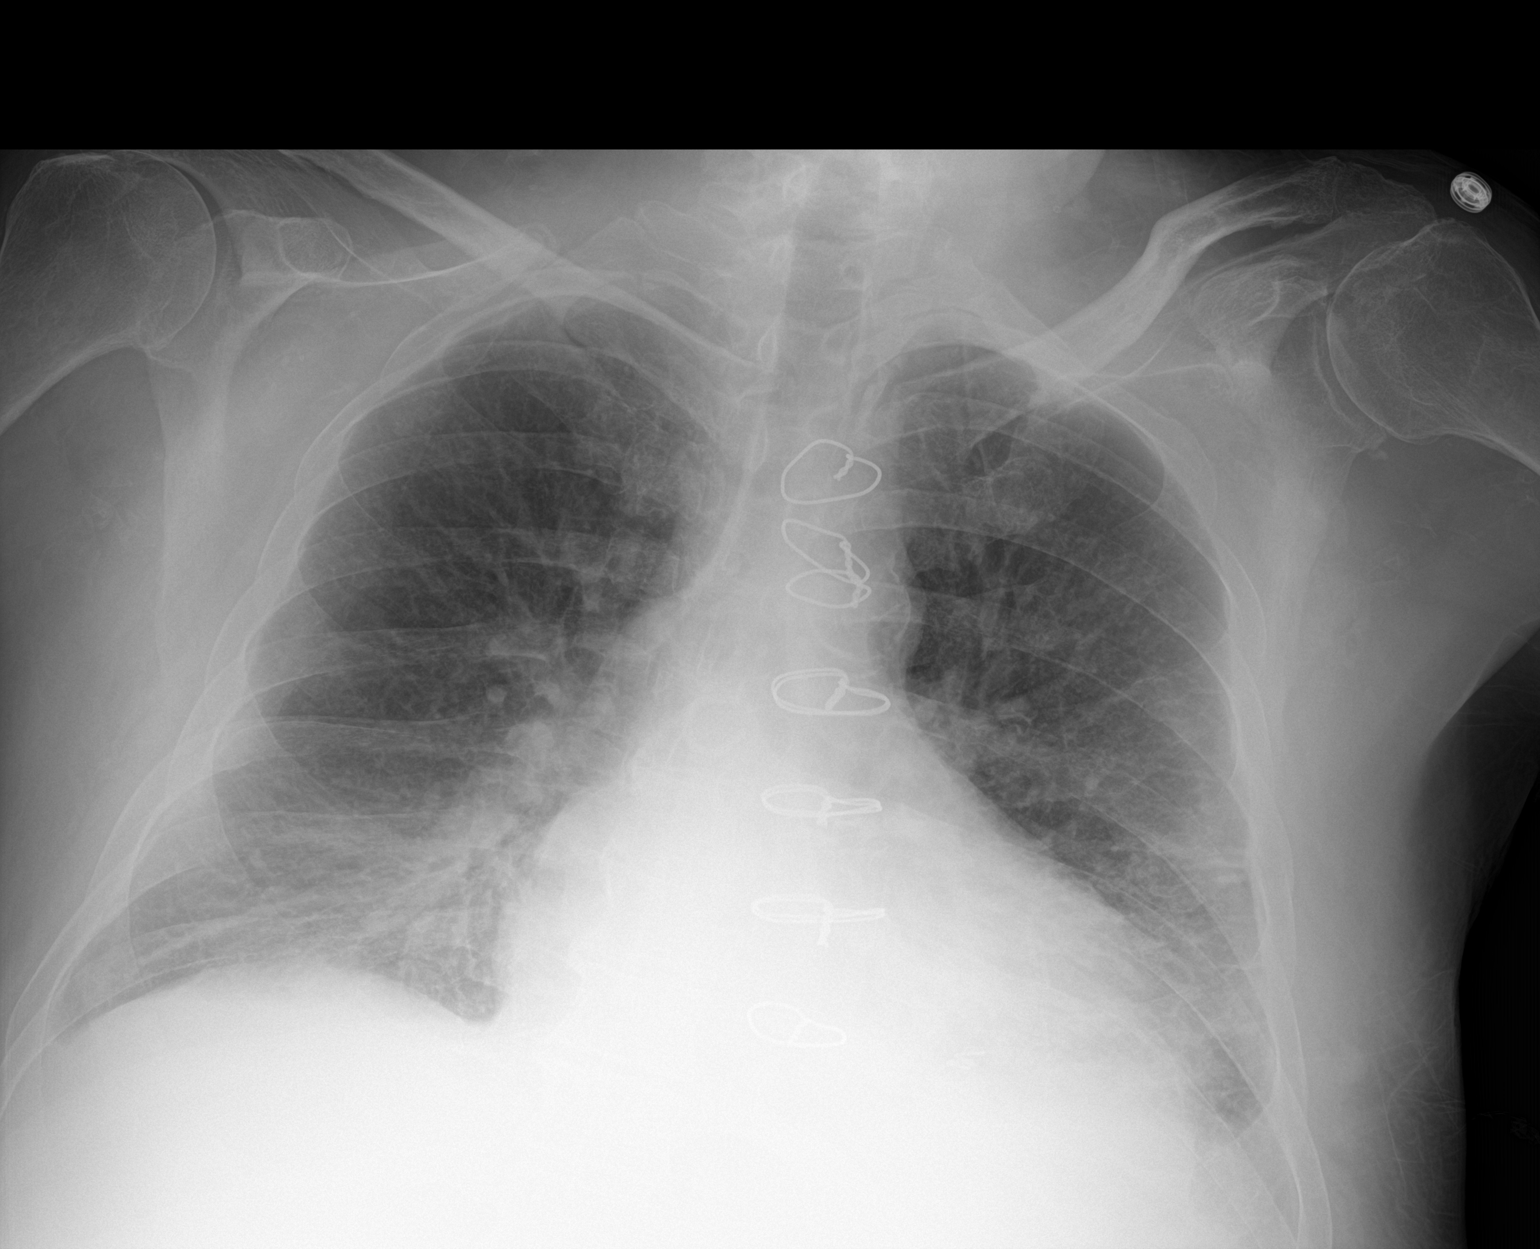

[1 of 1 positions shown; findings below may reference images not displayed]

FINDINGS: Bilateral mid to lower lung field streaky densities, new since the
prior radiograph and may represent atypical infiltrate, edema, or
atelectasis. Trace bilateral pleural effusions suspected. No
pneumothorax. Stable cardiomegaly. Atherosclerotic calcification of
the aorta. Median sternotomy wires. No acute osseous pathology.
IMPRESSION: Bilateral mid to lower lung field streaky densities may represent
atypical infiltrate, edema, or atelectasis.

## 2022-10-11 IMAGING — DX DG CHEST 1V PORT
1 series · 1 of 1 positions shown · non-contrast
Comparison: May 14, 2021

CLINICAL DATA: Cough and congestion.

EXAM:
PORTABLE CHEST 1 VIEW

[chest ap]
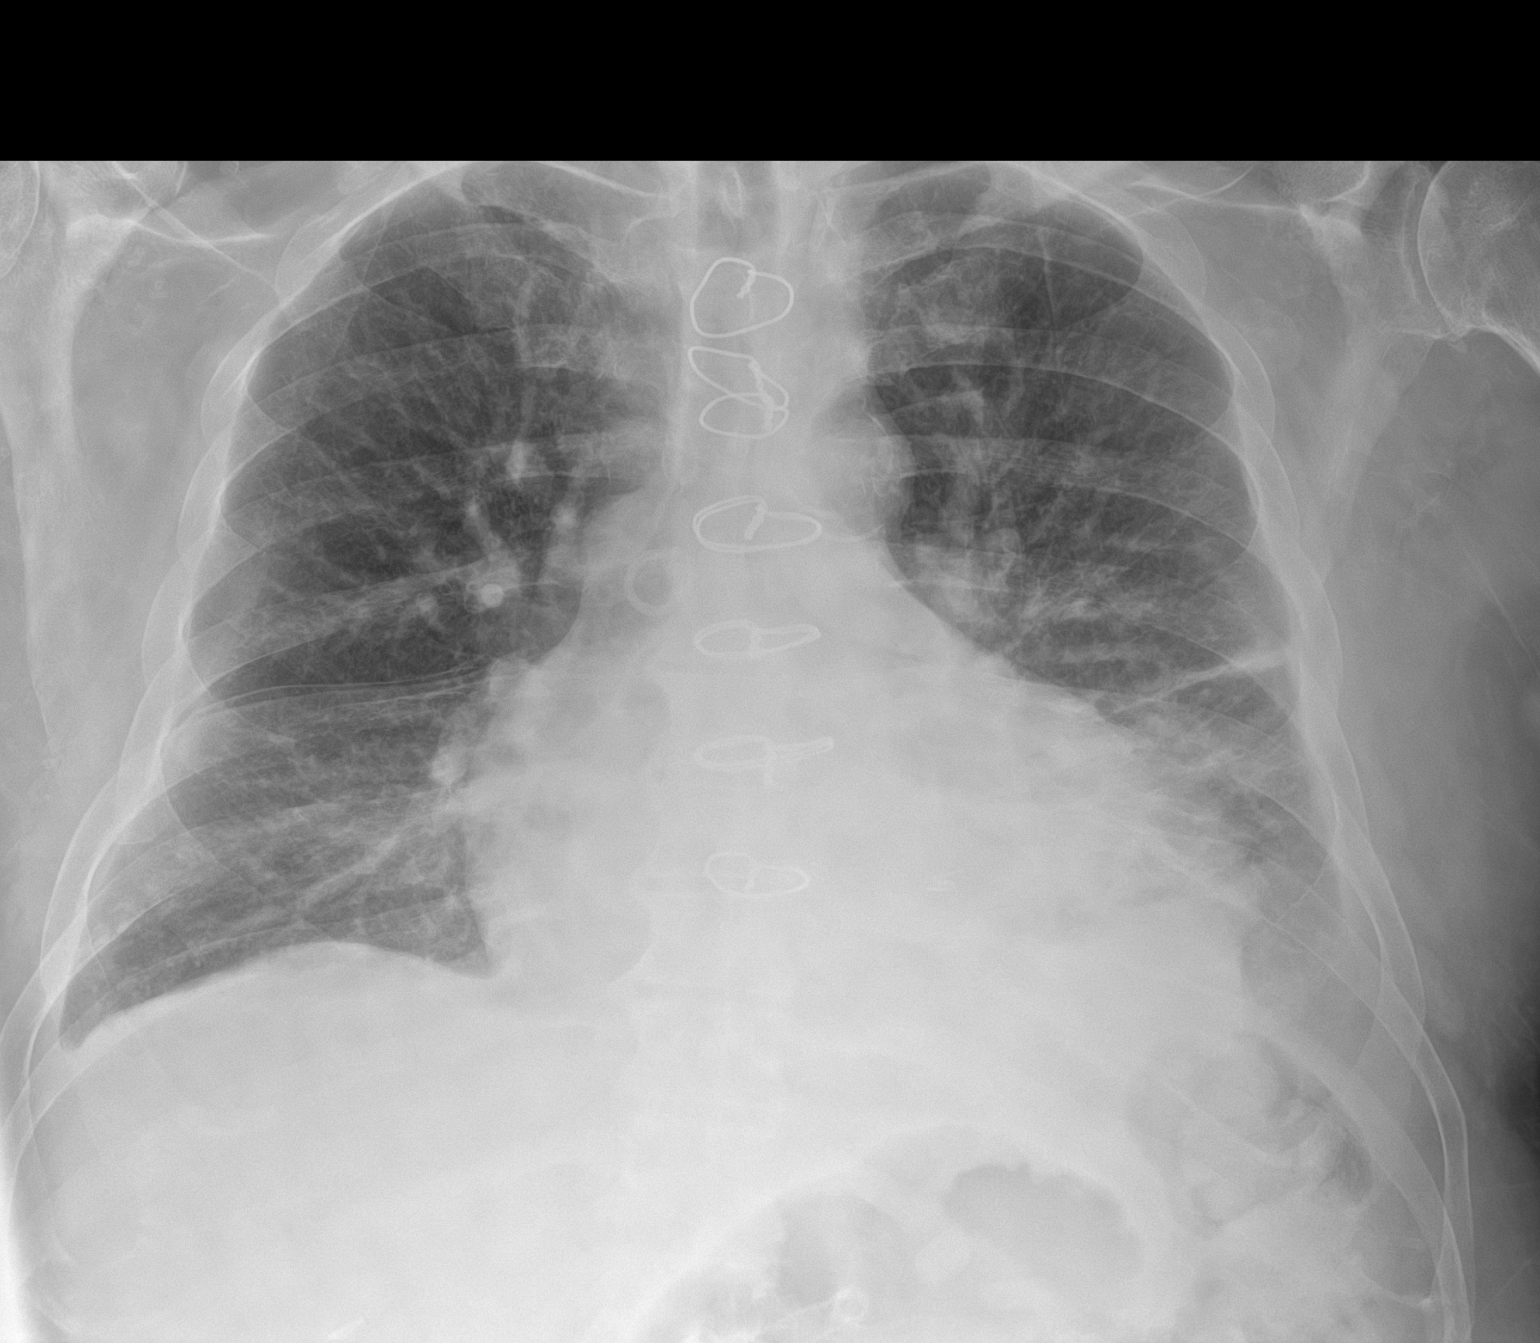

[1 of 1 positions shown; findings below may reference images not displayed]

FINDINGS: Postsurgical changes from CABG. Calcific atherosclerotic disease and
tortuosity of the aorta.

Enlarged cardiac silhouette.

Persistent streaky airspace opacities in the bilateral lung bases,
left greater than right.

Osseous structures are without acute abnormality. Soft tissues are
grossly normal.
IMPRESSION: 1. Persistent streaky airspace opacities in the bilateral lung
bases, left greater than right. These may represent peribronchial
infiltrates, atelectasis or early interstitial pulmonary edema.
2. Enlarged cardiac silhouette.
